# Patient Record
Sex: Male | Born: 1950
Health system: Southern US, Community
[De-identification: ages and names within clinical notes are randomized; demographics above are authoritative.]

## PROBLEM LIST (undated history)

## (undated) ENCOUNTER — Ambulatory Visit (HOSPITAL_COMMUNITY): Admission: EM | Payer: Medicare Other | Source: Home / Self Care

## (undated) DIAGNOSIS — Z87442 Personal history of urinary calculi: Secondary | ICD-10-CM

## (undated) DIAGNOSIS — Z8719 Personal history of other diseases of the digestive system: Secondary | ICD-10-CM

## (undated) DIAGNOSIS — I10 Essential (primary) hypertension: Secondary | ICD-10-CM

## (undated) DIAGNOSIS — I2510 Atherosclerotic heart disease of native coronary artery without angina pectoris: Secondary | ICD-10-CM

## (undated) DIAGNOSIS — C61 Malignant neoplasm of prostate: Secondary | ICD-10-CM

## (undated) DIAGNOSIS — E785 Hyperlipidemia, unspecified: Secondary | ICD-10-CM

## (undated) DIAGNOSIS — Z0000 Encounter for general adult medical examination without abnormal findings: Secondary | ICD-10-CM

## (undated) DIAGNOSIS — K625 Hemorrhage of anus and rectum: Secondary | ICD-10-CM

## (undated) DIAGNOSIS — E1149 Type 2 diabetes mellitus with other diabetic neurological complication: Secondary | ICD-10-CM

## (undated) DIAGNOSIS — K648 Other hemorrhoids: Secondary | ICD-10-CM

## (undated) DIAGNOSIS — M1990 Unspecified osteoarthritis, unspecified site: Secondary | ICD-10-CM

## (undated) DIAGNOSIS — Z89512 Acquired absence of left leg below knee: Secondary | ICD-10-CM

## (undated) DIAGNOSIS — G589 Mononeuropathy, unspecified: Secondary | ICD-10-CM

## (undated) DIAGNOSIS — L02416 Cutaneous abscess of left lower limb: Secondary | ICD-10-CM

## (undated) DIAGNOSIS — I872 Venous insufficiency (chronic) (peripheral): Secondary | ICD-10-CM

## (undated) DIAGNOSIS — Z8631 Personal history of diabetic foot ulcer: Secondary | ICD-10-CM

## (undated) DIAGNOSIS — Z860100 Personal history of colon polyps, unspecified: Secondary | ICD-10-CM

## (undated) DIAGNOSIS — H409 Unspecified glaucoma: Secondary | ICD-10-CM

## (undated) DIAGNOSIS — N529 Male erectile dysfunction, unspecified: Secondary | ICD-10-CM

## (undated) DIAGNOSIS — E119 Type 2 diabetes mellitus without complications: Secondary | ICD-10-CM

## (undated) DIAGNOSIS — I739 Peripheral vascular disease, unspecified: Secondary | ICD-10-CM

## (undated) DIAGNOSIS — M86271 Subacute osteomyelitis, right ankle and foot: Secondary | ICD-10-CM

## (undated) DIAGNOSIS — L03116 Cellulitis of left lower limb: Secondary | ICD-10-CM

## (undated) DIAGNOSIS — G4730 Sleep apnea, unspecified: Secondary | ICD-10-CM

## (undated) HISTORY — PX: BELPHAROPTOSIS REPAIR: SHX369

## (undated) HISTORY — DX: Cutaneous abscess of left lower limb: L02.416

## (undated) HISTORY — DX: Personal history of colon polyps, unspecified: Z86.0100

## (undated) HISTORY — PX: RETINAL DETACHMENT SURGERY: SHX105

## (undated) HISTORY — DX: Malignant neoplasm of prostate: C61

## (undated) HISTORY — DX: Encounter for general adult medical examination without abnormal findings: Z00.00

## (undated) HISTORY — PX: EYE SURGERY: SHX253

## (undated) HISTORY — DX: Type 2 diabetes mellitus with other diabetic neurological complication: E11.49

## (undated) HISTORY — DX: Subacute osteomyelitis, right ankle and foot: M86.271

## (undated) HISTORY — DX: Male erectile dysfunction, unspecified: N52.9

## (undated) HISTORY — DX: Cellulitis of left lower limb: L03.116

## (undated) HISTORY — DX: Venous insufficiency (chronic) (peripheral): I87.2

## (undated) HISTORY — DX: Hemorrhage of anus and rectum: K62.5

## (undated) HISTORY — PX: APPENDECTOMY: SHX54

## (undated) HISTORY — DX: Personal history of colonic polyps: Z86.010

## (undated) HISTORY — DX: Other hemorrhoids: K64.8

## (undated) HISTORY — DX: Personal history of diabetic foot ulcer: Z86.31

## (undated) HISTORY — DX: Atherosclerotic heart disease of native coronary artery without angina pectoris: I25.10

## (undated) HISTORY — DX: Acquired absence of left leg below knee: Z89.512

## (undated) HISTORY — DX: Hyperlipidemia, unspecified: E78.5

## (undated) HISTORY — DX: Unspecified glaucoma: H40.9

## (undated) HISTORY — PX: COLONOSCOPY: SHX174

## (undated) HISTORY — DX: Mononeuropathy, unspecified: G58.9

## (undated) HISTORY — DX: Sleep apnea, unspecified: G47.30

## (undated) HISTORY — DX: Personal history of other diseases of the digestive system: Z87.19

## (undated) HISTORY — DX: Unspecified osteoarthritis, unspecified site: M19.90

---

## 1991-01-08 HISTORY — PX: OTHER SURGICAL HISTORY: SHX169

## 1993-04-07 HISTORY — PX: KIDNEY STONE SURGERY: SHX686

## 1996-01-08 HISTORY — PX: CARDIAC CATHETERIZATION: SHX172

## 1998-09-27 ENCOUNTER — Encounter: Payer: Self-pay | Admitting: Ophthalmology

## 1998-09-27 ENCOUNTER — Observation Stay (HOSPITAL_COMMUNITY): Admission: EM | Admit: 1998-09-27 | Discharge: 1998-09-28 | Payer: Self-pay | Admitting: Ophthalmology

## 2003-04-08 HISTORY — PX: OTHER SURGICAL HISTORY: SHX169

## 2003-04-29 ENCOUNTER — Inpatient Hospital Stay (HOSPITAL_BASED_OUTPATIENT_CLINIC_OR_DEPARTMENT_OTHER): Admission: RE | Admit: 2003-04-29 | Discharge: 2003-04-29 | Payer: Self-pay | Admitting: Cardiovascular Disease

## 2003-05-03 ENCOUNTER — Ambulatory Visit (HOSPITAL_COMMUNITY): Admission: RE | Admit: 2003-05-03 | Discharge: 2003-05-04 | Payer: Self-pay | Admitting: *Deleted

## 2003-11-08 ENCOUNTER — Ambulatory Visit: Payer: Self-pay | Admitting: Cardiovascular Disease

## 2003-11-23 ENCOUNTER — Ambulatory Visit: Payer: Self-pay

## 2005-08-05 ENCOUNTER — Ambulatory Visit: Payer: Self-pay | Admitting: Internal Medicine

## 2005-09-05 ENCOUNTER — Ambulatory Visit: Payer: Self-pay | Admitting: Cardiovascular Disease

## 2005-09-07 HISTORY — PX: CORONARY ARTERY BYPASS GRAFT: SHX141

## 2005-09-10 ENCOUNTER — Ambulatory Visit: Payer: Self-pay

## 2005-09-23 ENCOUNTER — Ambulatory Visit: Payer: Self-pay | Admitting: Cardiovascular Disease

## 2005-09-26 ENCOUNTER — Encounter: Payer: Self-pay | Admitting: Vascular Surgery

## 2005-09-27 ENCOUNTER — Ambulatory Visit: Payer: Self-pay | Admitting: Cardiovascular Disease

## 2005-09-27 ENCOUNTER — Inpatient Hospital Stay (HOSPITAL_COMMUNITY): Admission: RE | Admit: 2005-09-27 | Discharge: 2005-10-05 | Payer: Self-pay | Admitting: Cardiovascular Disease

## 2005-10-02 ENCOUNTER — Ambulatory Visit: Payer: Self-pay | Admitting: Internal Medicine

## 2005-10-09 ENCOUNTER — Ambulatory Visit: Payer: Self-pay | Admitting: Internal Medicine

## 2005-10-24 ENCOUNTER — Encounter: Admission: RE | Admit: 2005-10-24 | Discharge: 2005-10-24 | Payer: Self-pay | Admitting: Cardiothoracic Surgery

## 2005-10-28 ENCOUNTER — Encounter: Admission: RE | Admit: 2005-10-28 | Discharge: 2006-01-26 | Payer: Self-pay | Admitting: Internal Medicine

## 2005-11-12 ENCOUNTER — Ambulatory Visit: Payer: Self-pay | Admitting: Internal Medicine

## 2005-11-14 ENCOUNTER — Encounter (HOSPITAL_COMMUNITY): Admission: RE | Admit: 2005-11-14 | Discharge: 2006-01-08 | Payer: Self-pay | Admitting: Cardiovascular Disease

## 2005-11-14 ENCOUNTER — Ambulatory Visit: Payer: Self-pay | Admitting: Cardiovascular Disease

## 2005-12-24 ENCOUNTER — Encounter: Payer: Self-pay | Admitting: Internal Medicine

## 2005-12-26 ENCOUNTER — Ambulatory Visit: Payer: Self-pay | Admitting: Cardiovascular Disease

## 2006-01-09 ENCOUNTER — Encounter (HOSPITAL_COMMUNITY): Admission: RE | Admit: 2006-01-09 | Discharge: 2006-03-19 | Payer: Self-pay | Admitting: Cardiovascular Disease

## 2006-01-23 ENCOUNTER — Ambulatory Visit: Payer: Self-pay | Admitting: Cardiovascular Disease

## 2006-02-12 ENCOUNTER — Ambulatory Visit: Payer: Self-pay | Admitting: Internal Medicine

## 2006-06-11 ENCOUNTER — Encounter: Payer: Self-pay | Admitting: Internal Medicine

## 2006-06-11 DIAGNOSIS — E785 Hyperlipidemia, unspecified: Secondary | ICD-10-CM | POA: Insufficient documentation

## 2006-06-11 DIAGNOSIS — H409 Unspecified glaucoma: Secondary | ICD-10-CM | POA: Insufficient documentation

## 2006-06-11 DIAGNOSIS — I2510 Atherosclerotic heart disease of native coronary artery without angina pectoris: Secondary | ICD-10-CM | POA: Insufficient documentation

## 2006-06-11 DIAGNOSIS — G4733 Obstructive sleep apnea (adult) (pediatric): Secondary | ICD-10-CM | POA: Insufficient documentation

## 2006-06-13 ENCOUNTER — Ambulatory Visit: Payer: Self-pay | Admitting: Internal Medicine

## 2006-06-13 DIAGNOSIS — N529 Male erectile dysfunction, unspecified: Secondary | ICD-10-CM | POA: Insufficient documentation

## 2006-06-16 LAB — CONVERTED CEMR LAB
ALT: 39 units/L (ref 0–40)
Albumin: 3.9 g/dL (ref 3.5–5.2)
BUN: 18 mg/dL (ref 6–23)
CO2: 29 meq/L (ref 19–32)
Calcium: 9.8 mg/dL (ref 8.4–10.5)
Chloride: 102 meq/L (ref 96–112)
Cholesterol: 160 mg/dL (ref 0–200)
Creatinine, Ser: 1.1 mg/dL (ref 0.4–1.5)
GFR calc Af Amer: 88 mL/min
GFR calc non Af Amer: 73 mL/min
Glucose, Bld: 139 mg/dL — ABNORMAL HIGH (ref 70–99)
HDL: 26.5 mg/dL — ABNORMAL LOW (ref >39.0)
Hgb A1c MFr Bld: 7.5 % — ABNORMAL HIGH (ref 4.6–6.0)
LDL Cholesterol: 95 mg/dL (ref 0–99)
Phosphorus: 4.3 mg/dL (ref 2.3–4.6)
Potassium: 4.8 meq/L (ref 3.5–5.1)
Sodium: 140 meq/L (ref 135–145)
Total CHOL/HDL Ratio: 6
Triglycerides: 194 mg/dL — ABNORMAL HIGH (ref 0–149)
VLDL: 39 mg/dL (ref 0–40)

## 2006-07-04 ENCOUNTER — Ambulatory Visit: Payer: Self-pay | Admitting: Internal Medicine

## 2006-07-08 ENCOUNTER — Telehealth (INDEPENDENT_AMBULATORY_CARE_PROVIDER_SITE_OTHER): Payer: Self-pay | Admitting: *Deleted

## 2006-07-24 ENCOUNTER — Telehealth: Payer: Self-pay | Admitting: Internal Medicine

## 2006-07-25 ENCOUNTER — Encounter: Payer: Self-pay | Admitting: Internal Medicine

## 2006-11-18 ENCOUNTER — Encounter: Payer: Self-pay | Admitting: Internal Medicine

## 2006-11-18 ENCOUNTER — Ambulatory Visit: Payer: Self-pay | Admitting: Cardiovascular Disease

## 2006-12-26 ENCOUNTER — Ambulatory Visit: Payer: Self-pay | Admitting: Internal Medicine

## 2006-12-29 LAB — CONVERTED CEMR LAB
ALT: 28 units/L (ref 0–53)
AST: 25 units/L (ref 0–37)
Albumin: 4.4 g/dL (ref 3.5–5.2)
Alkaline Phosphatase: 48 units/L (ref 39–117)
Chloride: 102 meq/L (ref 96–112)
Cholesterol: 154 mg/dL (ref 0–200)
Creatinine, Ser: 0.94 mg/dL (ref 0.40–1.50)
Creatinine, Urine: 150.8 mg/dL
HDL: 30 mg/dL — ABNORMAL LOW (ref >39)
Microalb Creat Ratio: 6.2 mg/g (ref 0.0–30.0)
Total Protein: 6.7 g/dL (ref 6.0–8.3)
Triglycerides: 467 mg/dL — ABNORMAL HIGH (ref <150)

## 2007-01-08 HISTORY — PX: INSERTION PROSTATE RADIATION SEED: SUR718

## 2007-01-27 ENCOUNTER — Ambulatory Visit: Payer: Self-pay | Admitting: Gastroenterology

## 2007-02-17 LAB — HM COLONOSCOPY

## 2007-02-20 ENCOUNTER — Ambulatory Visit: Payer: Self-pay | Admitting: Gastroenterology

## 2007-02-20 ENCOUNTER — Encounter: Payer: Self-pay | Admitting: Gastroenterology

## 2007-02-20 ENCOUNTER — Encounter: Payer: Self-pay | Admitting: Internal Medicine

## 2007-04-03 ENCOUNTER — Ambulatory Visit: Payer: Self-pay | Admitting: Internal Medicine

## 2007-04-03 DIAGNOSIS — M159 Polyosteoarthritis, unspecified: Secondary | ICD-10-CM | POA: Insufficient documentation

## 2007-04-06 LAB — CONVERTED CEMR LAB: Hgb A1c MFr Bld: 7 % — ABNORMAL HIGH (ref 4.6–6.0)

## 2007-04-08 DIAGNOSIS — C61 Malignant neoplasm of prostate: Secondary | ICD-10-CM | POA: Insufficient documentation

## 2007-04-15 ENCOUNTER — Encounter: Payer: Self-pay | Admitting: Internal Medicine

## 2007-05-01 ENCOUNTER — Encounter: Payer: Self-pay | Admitting: Internal Medicine

## 2007-05-19 ENCOUNTER — Ambulatory Visit (HOSPITAL_COMMUNITY): Admission: RE | Admit: 2007-05-19 | Discharge: 2007-05-19 | Payer: Self-pay | Admitting: Urology

## 2007-05-28 ENCOUNTER — Ambulatory Visit: Payer: Self-pay | Admitting: Internal Medicine

## 2007-06-08 ENCOUNTER — Encounter: Payer: Self-pay | Admitting: Internal Medicine

## 2007-06-15 ENCOUNTER — Ambulatory Visit: Payer: Self-pay | Admitting: Internal Medicine

## 2007-06-16 ENCOUNTER — Ambulatory Visit: Admission: RE | Admit: 2007-06-16 | Discharge: 2007-09-14 | Payer: Self-pay | Admitting: Radiation Oncology

## 2007-06-16 LAB — CONVERTED CEMR LAB: Hgb A1c MFr Bld: 7.4 % — ABNORMAL HIGH (ref 4.6–6.0)

## 2007-06-17 ENCOUNTER — Encounter: Payer: Self-pay | Admitting: Internal Medicine

## 2007-06-26 ENCOUNTER — Encounter: Payer: Self-pay | Admitting: Internal Medicine

## 2007-07-09 ENCOUNTER — Ambulatory Visit: Payer: Self-pay | Admitting: Cardiovascular Disease

## 2007-09-15 ENCOUNTER — Ambulatory Visit: Admission: RE | Admit: 2007-09-15 | Discharge: 2007-10-07 | Payer: Self-pay | Admitting: Radiation Oncology

## 2007-10-16 ENCOUNTER — Encounter: Admission: RE | Admit: 2007-10-16 | Discharge: 2007-10-16 | Payer: Self-pay | Admitting: Urology

## 2007-10-23 ENCOUNTER — Encounter: Payer: Self-pay | Admitting: Internal Medicine

## 2007-10-26 ENCOUNTER — Ambulatory Visit (HOSPITAL_BASED_OUTPATIENT_CLINIC_OR_DEPARTMENT_OTHER): Admission: RE | Admit: 2007-10-26 | Discharge: 2007-10-26 | Payer: Self-pay | Admitting: Urology

## 2007-11-09 ENCOUNTER — Ambulatory Visit: Payer: Self-pay | Admitting: Family Medicine

## 2007-11-11 ENCOUNTER — Encounter: Payer: Self-pay | Admitting: Internal Medicine

## 2007-11-12 ENCOUNTER — Ambulatory Visit: Admission: RE | Admit: 2007-11-12 | Discharge: 2008-01-03 | Payer: Self-pay | Admitting: Radiation Oncology

## 2007-12-14 ENCOUNTER — Ambulatory Visit: Payer: Self-pay | Admitting: Internal Medicine

## 2007-12-16 LAB — CONVERTED CEMR LAB
CO2: 28 meq/L (ref 19–32)
Chloride: 106 meq/L (ref 96–112)
GFR calc Af Amer: 111 mL/min
GFR calc non Af Amer: 92 mL/min
Hgb A1c MFr Bld: 7.4 % — ABNORMAL HIGH (ref 4.6–6.0)
Phosphorus: 4.8 mg/dL — ABNORMAL HIGH (ref 2.3–4.6)
Potassium: 4.3 meq/L (ref 3.5–5.1)
Sodium: 141 meq/L (ref 135–145)

## 2008-02-26 ENCOUNTER — Encounter: Payer: Self-pay | Admitting: Internal Medicine

## 2008-05-17 ENCOUNTER — Ambulatory Visit: Payer: Self-pay | Admitting: Internal Medicine

## 2008-06-24 ENCOUNTER — Encounter: Payer: Self-pay | Admitting: Internal Medicine

## 2008-06-29 ENCOUNTER — Ambulatory Visit: Payer: Self-pay | Admitting: Internal Medicine

## 2008-06-29 DIAGNOSIS — I872 Venous insufficiency (chronic) (peripheral): Secondary | ICD-10-CM | POA: Insufficient documentation

## 2008-07-01 LAB — CONVERTED CEMR LAB
AST: 23 units/L (ref 0–37)
Albumin: 4.1 g/dL (ref 3.5–5.2)
Alkaline Phosphatase: 39 units/L (ref 39–117)
BUN: 20 mg/dL (ref 6–23)
Basophils Relative: 0 % (ref 0.0–3.0)
Bilirubin, Direct: 0 mg/dL (ref 0.0–0.3)
Cholesterol: 157 mg/dL (ref 0–200)
Creatinine, Ser: 0.8 mg/dL (ref 0.4–1.5)
Direct LDL: 94.2 mg/dL
Eosinophils Absolute: 0.3 10*3/uL (ref 0.0–0.7)
Eosinophils Relative: 4.1 % (ref 0.0–5.0)
Glucose, Bld: 191 mg/dL — ABNORMAL HIGH (ref 70–99)
Hgb A1c MFr Bld: 8.3 % — ABNORMAL HIGH (ref 4.6–6.5)
Lymphocytes Relative: 16.8 % (ref 12.0–46.0)
MCHC: 34.9 g/dL (ref 30.0–36.0)
Monocytes Absolute: 0.5 10*3/uL (ref 0.1–1.0)
Neutrophils Relative %: 72.2 % (ref 43.0–77.0)
Platelets: 161 10*3/uL (ref 150.0–400.0)
RBC: 4.36 M/uL (ref 4.22–5.81)
TSH: 0.82 microintl units/mL (ref 0.35–5.50)
Total Protein: 6.9 g/dL (ref 6.0–8.3)
WBC: 7.7 10*3/uL (ref 4.5–10.5)

## 2008-07-26 ENCOUNTER — Encounter: Payer: Self-pay | Admitting: Internal Medicine

## 2008-11-09 ENCOUNTER — Encounter: Payer: Self-pay | Admitting: Internal Medicine

## 2008-12-13 ENCOUNTER — Ambulatory Visit: Payer: Self-pay | Admitting: Internal Medicine

## 2008-12-14 LAB — CONVERTED CEMR LAB
Alkaline Phosphatase: 41 units/L (ref 39–117)
BUN: 17 mg/dL (ref 6–23)
Basophils Absolute: 0 10*3/uL (ref 0.0–0.1)
Bilirubin, Direct: 0.1 mg/dL (ref 0.0–0.3)
CO2: 29 meq/L (ref 19–32)
Calcium: 9.5 mg/dL (ref 8.4–10.5)
Chloride: 104 meq/L (ref 96–112)
Creatinine, Ser: 0.9 mg/dL (ref 0.4–1.5)
GFR calc non Af Amer: 91.31 mL/min (ref >60)
HCT: 40.7 % (ref 39.0–52.0)
HDL: 28.4 mg/dL — ABNORMAL LOW (ref >39.00)
Hemoglobin: 13.8 g/dL (ref 13.0–17.0)
Hgb A1c MFr Bld: 8.6 % — ABNORMAL HIGH (ref 4.6–6.5)
Lymphs Abs: 1.1 10*3/uL (ref 0.7–4.0)
MCHC: 33.9 g/dL (ref 30.0–36.0)
MCV: 89.6 fL (ref 78.0–100.0)
Microalb, Ur: 1.5 mg/dL (ref 0.0–1.9)
Monocytes Absolute: 0.4 10*3/uL (ref 0.1–1.0)
Monocytes Relative: 5.5 % (ref 3.0–12.0)
Neutro Abs: 6.1 10*3/uL (ref 1.4–7.7)
Platelets: 155 10*3/uL (ref 150.0–400.0)
Potassium: 4.4 meq/L (ref 3.5–5.1)
RDW: 14.8 % — ABNORMAL HIGH (ref 11.5–14.6)
TSH: 0.76 microintl units/mL (ref 0.35–5.50)
Total Bilirubin: 0.9 mg/dL (ref 0.3–1.2)
Total CHOL/HDL Ratio: 5
Total Protein: 6.9 g/dL (ref 6.0–8.3)
VLDL: 56.8 mg/dL — ABNORMAL HIGH (ref 0.0–40.0)

## 2009-03-17 ENCOUNTER — Encounter: Payer: Self-pay | Admitting: Internal Medicine

## 2009-07-03 ENCOUNTER — Ambulatory Visit: Payer: Self-pay | Admitting: Internal Medicine

## 2009-07-03 DIAGNOSIS — K625 Hemorrhage of anus and rectum: Secondary | ICD-10-CM | POA: Insufficient documentation

## 2009-07-04 LAB — CONVERTED CEMR LAB
Basophils Absolute: 0 10*3/uL (ref 0.0–0.1)
Eosinophils Relative: 2.6 % (ref 0.0–5.0)
HCT: 40.2 % (ref 39.0–52.0)
Hemoglobin: 14.1 g/dL (ref 13.0–17.0)
Hgb A1c MFr Bld: 9.2 % — ABNORMAL HIGH (ref 4.6–6.5)
Lymphs Abs: 1.1 10*3/uL (ref 0.7–4.0)
MCV: 87.9 fL (ref 78.0–100.0)
Monocytes Absolute: 0.5 10*3/uL (ref 0.1–1.0)
Monocytes Relative: 6 % (ref 3.0–12.0)
Neutro Abs: 7.2 10*3/uL (ref 1.4–7.7)
RDW: 15.9 % — ABNORMAL HIGH (ref 11.5–14.6)

## 2009-07-21 ENCOUNTER — Encounter: Payer: Self-pay | Admitting: Internal Medicine

## 2009-07-27 ENCOUNTER — Encounter: Payer: Self-pay | Admitting: Internal Medicine

## 2009-11-13 ENCOUNTER — Ambulatory Visit: Payer: Self-pay | Admitting: Internal Medicine

## 2009-11-14 ENCOUNTER — Telehealth (INDEPENDENT_AMBULATORY_CARE_PROVIDER_SITE_OTHER): Payer: Self-pay

## 2009-11-14 ENCOUNTER — Encounter: Payer: Self-pay | Admitting: Gastroenterology

## 2009-11-22 ENCOUNTER — Ambulatory Visit: Payer: Self-pay | Admitting: Gastroenterology

## 2009-11-22 DIAGNOSIS — K648 Other hemorrhoids: Secondary | ICD-10-CM | POA: Insufficient documentation

## 2009-11-22 DIAGNOSIS — Z860100 Personal history of colon polyps, unspecified: Secondary | ICD-10-CM | POA: Insufficient documentation

## 2009-11-29 ENCOUNTER — Encounter: Payer: Self-pay | Admitting: Internal Medicine

## 2009-12-04 ENCOUNTER — Ambulatory Visit (HOSPITAL_COMMUNITY): Admission: RE | Admit: 2009-12-04 | Discharge: 2009-12-04 | Payer: Self-pay | Admitting: Gastroenterology

## 2009-12-04 ENCOUNTER — Ambulatory Visit: Payer: Self-pay | Admitting: Gastroenterology

## 2009-12-04 LAB — HM SIGMOIDOSCOPY

## 2009-12-13 ENCOUNTER — Ambulatory Visit: Payer: Self-pay | Admitting: Gastroenterology

## 2009-12-15 ENCOUNTER — Ambulatory Visit: Payer: Self-pay | Admitting: Internal Medicine

## 2009-12-15 DIAGNOSIS — E1349 Other specified diabetes mellitus with other diabetic neurological complication: Secondary | ICD-10-CM

## 2009-12-15 DIAGNOSIS — E1365 Other specified diabetes mellitus with hyperglycemia: Secondary | ICD-10-CM | POA: Insufficient documentation

## 2009-12-16 LAB — CONVERTED CEMR LAB
Albumin: 3.9 g/dL (ref 3.5–5.2)
Basophils Relative: 0.7 % (ref 0.0–3.0)
Chloride: 103 meq/L (ref 96–112)
Cholesterol: 159 mg/dL (ref 0–200)
Eosinophils Relative: 4.9 % (ref 0.0–5.0)
GFR calc non Af Amer: 91 mL/min (ref >60.00)
HCT: 38.9 % — ABNORMAL LOW (ref 39.0–52.0)
HDL: 26.4 mg/dL — ABNORMAL LOW (ref >39.00)
Hemoglobin: 13.4 g/dL (ref 13.0–17.0)
Lymphs Abs: 1 10*3/uL (ref 0.7–4.0)
Microalb Creat Ratio: 0.8 mg/g (ref 0.0–30.0)
Monocytes Relative: 5.9 % (ref 3.0–12.0)
Neutro Abs: 6.4 10*3/uL (ref 1.4–7.7)
Platelets: 166 10*3/uL (ref 150.0–400.0)
Potassium: 4.4 meq/L (ref 3.5–5.1)
RBC: 4.38 M/uL (ref 4.22–5.81)
Sodium: 140 meq/L (ref 135–145)
TSH: 1.2 microintl units/mL (ref 0.35–5.50)
Total CHOL/HDL Ratio: 6
Total Protein: 6.6 g/dL (ref 6.0–8.3)
VLDL: 58 mg/dL — ABNORMAL HIGH (ref 0.0–40.0)
WBC: 8.4 10*3/uL (ref 4.5–10.5)

## 2010-02-08 NOTE — Assessment & Plan Note (Signed)
Summary: RECTAL BLEEDING/YF   History of Present Illness Visit Type: Initial Consult Primary GI MD: Elie Goody MD [facility] Primary Kathreen Dileo: Tillman Abide, MD Requesting [place] Pam: Tillman Abide, MD Chief Complaint: Rectal bleeding, BRB & dark  blood colts in toilet, soaks thru pateints  clothes History of Present Illness:   Patient is a 60 year old male with history of multiple medical problems. He had a screening colonoscopy with removal of an adenomatous polyp Jan. 2009 by Dr. Russella Dar. Following that, patient diagnosed with prostate cancer, underwent radiation treatments followed by seed implants. Over the last six months patient has had intermittent painless rectal bleeding.  Bowel movements are otherwise normal.. Sometimes if he bleeds a lot feels a little LLQ abdominal discomfort.     GI Review of Systems    Reports abdominal pain.     Location of  Abdominal pain: LLQ.    Denies acid reflux, belching, bloating, chest pain, dysphagia with liquids, dysphagia with solids, heartburn, loss of appetite, nausea, vomiting, vomiting blood, weight loss, and  weight gain.      Reports rectal bleeding.     Denies anal fissure, black tarry stools, change in bowel habit, constipation, diarrhea, diverticulosis, fecal incontinence, heme positive stool, hemorrhoids, irritable bowel syndrome, jaundice, light color stool, liver problems, and  rectal pain.   Current Medications (verified): 1)  Glipizide 5 Mg Tabs (Glipizide) .... Take 1 By Mouth Once Daily 2)  Lantus 100 Unit/ml Soln (Insulin Glargine) .... Take 65 Units Every Evening 3)  Hyzaar 50-12.5 Mg Tabs (Losartan Potassium-Hctz) .... Take One By Mouth Daily 4)  Metformin Hcl 1000 Mg  Tabs (Metformin Hcl) .Marland Kitchen.. 1 Two Times A Day 5)  Carvedilol 25 Mg Tabs (Carvedilol) .... Take One Tablet By Mouth Twice A Day 6)  Aspirin 325 Mg Tabs (Aspirin) .... Take 1 Tablet By Mouth Once A Day 7)  Alphagan P 0.15 % Soln (Brimonidine Tartrate) .... Use One  Drop in Each Eye Daily 8)  Multivitamins   Tabs (Multiple Vitamin) .... Take One By Mouth Daily 9)  Onetouch Ultra Test  Strp (Glucose Blood) .... Check Weekly 10)  Triamcinolone Acetonide 0.025 % Crea (Triamcinolone Acetonide) .... Apply Three Times A Day As Needed To Irritated Area  Allergies (verified): 1)  ! Zocor (Simvastatin) 2)  ! Zetia (Ezetimibe) 3)  ! Lipitor (Atorvastatin Calcium)  Past History:  Past Medical History: Reviewed history from 06/29/2008 and no changes required. Coronary artery disease----------------------------------------Dr Eden Emms Diabetes mellitus, type II Hyperlipidemia Sleep apnea Glaucoma Gallstones Osteoarthritis Prostate cancer Chronic venous insufficiency  Past Surgical History: Reviewed history from 11/13/2009 and no changes required. 1998    CP/SOB  cath (-)   [C-Pap started]             Appendix 1993    Thrombosed vein - Right leg 4/95      Kidney stone 2002-2003  Retinal detachment 4/05      RCA stents x 2 - EF 55% 11/05    Myoview stress (+) EKG (-) images EF 57% 9/07      Cardiolite - IWMI - hypotension / EKG changes 9/07             CABG (post-op A.Fib) 2009    RT and seeds for prostate cancer  Family History: Reviewed history from 06/11/2006 and no changes required. Father: Died 84 lung CA Mother: Alive 24 Siblings: 1 sister alive CV:  Paternal aunts and uncles died of MI's HBP:  ?? DM:  some  Social  History: Reviewed history from 04/03/2007 and no changes required. Widowed 2006 Stays with mom alternate months (has MS) Children: 3 children Occupation: Heavy Theatre stage manager Hobbies:  Gardens Quit cigars about 2003 Alcohol use-no  Review of Systems       The patient complains of back pain, fatigue, and muscle pains/cramps.  The patient denies allergy/sinus, anemia, anxiety-new, arthritis/joint pain, blood in urine, breast changes/lumps, change in vision, confusion, cough, coughing up blood, depression-new, fainting,  fever, headaches-new, hearing problems, heart murmur, heart rhythm changes, itching, menstrual pain, night sweats, nosebleeds, pregnancy symptoms, shortness of breath, skin rash, sleeping problems, sore throat, swelling of feet/legs, swollen lymph glands, thirst - excessive , urination - excessive , urination changes/pain, urine leakage, vision changes, and voice change.    Vital Signs:  Patient profile:   60 year old male Height:      62 inches Weight:      290.25 pounds BMI:     53.28 Pulse rate:   92 / minute Pulse rhythm:   regular BP sitting:   122 / 78  (left arm) Cuff size:   regular  Vitals Entered By: June McMurray CMA Duncan Dull) (November 22, 2009 8:32 AM)  Physical Exam  General:  Pleasant, obese white male Eyes:  Conjunctiva pink, no icterus.  Neck:  no obvious masses  Lungs:  Clear throughout to auscultation. Heart:  Regular rate and rhythm; no murmurs, rubs,  or bruits. Abdomen:  Abdomen obese soft, nontender, nondistended. No obvious masses or hepatomegaly.Normal bowel sounds.  Rectal:  No external lesions. No internal lesions felt. On anoscopic view there were some mildly inflamed internal hemorrhoids. Light brown heme negative stool Msk:  Symmetrical with no gross deformities. Normal posture. Extremities:  No palmar erythema, no edema.  Neurologic:  Alert and  oriented x4;  grossly normal neurologically. Skin:  Intact without significant lesions or rashes. Cervical Nodes:  No significant cervical adenopathy. Psych:  Alert and cooperative. Normal mood and affect.  Impression & Recommendations:  Problem # 1:  RECTAL BLEEDING (ICD-569.3) Assessment Deteriorated Frequent painless rectal bleeding over last six months, possible secondary to internal hemorrhoids and / or radiation proctitis. Full colonoscopy two years ago. He looks fine, no pallor, abdominal exam benign.  For further evaluation the patient will be scheduled for a flexible sigmoidoscopy/biopsies/APC (if  indicated).  The risks and benefits of the procedure were discussed with the patient and his friend Almyra Free. Patient agrees to proceed. In the meantime will try steroid suppositories for internal hemorhoids. Orders: Flex with Sedation (Flex w/Sed)  Problem # 2:  PERSONAL HX COLONIC POLYPS (ICD-V12.72) Assessment: Comment Only Adenomatous polyp in Feb.2009. For recall colonoscopy Feb. 2014  Problem # 3:  HEMORRHOIDS-INTERNAL (ICD-455.0) Assessment: Deteriorated See #1.  Patient Instructions: 1)  We have sent a prescription for suppositories to your pharmacy.  2)  We have scheduled the Flexible Sigmoidoscopy with Dr. Claudette Head on 12-13-09. 3)  Directions and brochure provided. 4)  Diabetic medication instructions provided. 5)  [facility] Patient Information Guide given to patient. 6)  Copy sent to : Dr. Tillman Abide 7)  The medication list was reviewed and reconciled.  All changed / newly prescribed medications were explained.  A complete medication list was provided to the patient / caregiver.  Prescriptions: HEMORRHOIDAL-HC 25 MG SUPP (HYDROCORTISONE ACETATE) Insert one into rectum every night for 7 days  #7 x 1   Entered and Authorized by:   Willette Cluster NP   Signed by:   Willette Cluster NP  on 11/23/2009   Method used:   Electronically to        [facility]  [address] [facility] [address] [id number]       [address]       [place], [place]  [address]       Ph: [phone]       Fax: [phone]   RxID:   [id number]

## 2010-02-08 NOTE — Progress Notes (Signed)
Summary: rescedule REV   ---- Converted from flag ---- ---- 11/13/2009 6:30 PM, Meryl Dare MD [facility] wrote: Yes please move up. Can see extender on day I am supervising.  ---- 11/13/2009 1:49 PM, Darcey Nora RN, CGRN wrote: he is scheduled for 12/27/09 do you want me to move it up?  ---- 11/13/2009 10:28 AM, Meryl Dare MD [facility] wrote: Was this patient scheduled for an REV with me? If not they should have REV. Note forwarded to me with rectal bleeding. ------------------------------  Phone Note Outgoing Call Call back at [facility] Phone [phone]   Call placed by: Darcey Nora RN, CGRN,  November 14, 2009 11:06 AM Call placed to: Patient Summary of Call: rescheduled the appointment for 11/22/09 8:30 with Willette Cluster RNP  Initial call taken by: Darcey Nora RN, CGRN,  November 14, 2009 11:07 AM

## 2010-02-08 NOTE — Letter (Signed)
Summary: [facility]  [facility]   Imported By: Lanelle Bal 07/28/2009 11:30:49  _____________________________________________________________________  External Attachment:    Type:   Image     Comment:   External Document  Appended Document: [facility] No evidence of recurrent prostate cancer Having occ BRBPR

## 2010-02-08 NOTE — Assessment & Plan Note (Signed)
Summary: RECTUM BLEEDING/DLO   Vital Signs:  Patient profile:   60 year old male Weight:      292 pounds Temp:     98.3 degrees F oral Pulse rate:   80 / minute Pulse rhythm:   regular BP sitting:   150 / 80  (left arm) Cuff size:   large  Vitals Entered By: Mervin Hack CMA Duncan Dull) (November 13, 2009 10:01 AM) CC: rectal bleeding   History of Present Illness: Still bleeding Has been going on intermittently for about 1 year worse during the week--when he is standing on concrete all the time---can actually bleed through his clothes  Occ LLQ discomfort---seems to be irritated by eating at times Blood is bright red  Has been having 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 Zocor (Simvastatin) 2)  ! Zetia (Ezetimibe) 3)  ! Lipitor (Atorvastatin Calcium)  Past History:  Past medical, surgical, family and social histories (including risk factors) reviewed for relevance to current acute and chronic problems.  Past Medical History: Reviewed history from 06/29/2008 and no changes required. Coronary artery disease----------------------------------------Dr Eden Emms Diabetes mellitus, type II Hyperlipidemia Sleep apnea Glaucoma Gallstones Osteoarthritis Prostate cancer Chronic venous insufficiency  Past Surgical History: 1998    CP/SOB  cath (-)   [C-Pap started]             Appendix 1993    Thrombosed vein - Right leg 4/95      Kidney stone 2002-2003  Retinal detachment 4/05      RCA stents x 2 - EF 55% 11/05    Myoview stress (+) EKG (-) images EF 57% 9/07      Cardiolite - IWMI - hypotension / EKG changes 9/07             CABG (post-op A.Fib) 2009    RT and seeds for prostate cancer  Family History: Reviewed history from 06/11/2006 and no changes required. Father: Died 68 lung CA Mother: Alive 86 Siblings: 1 sister alive CV:  Paternal  aunts and uncles died of MI's HBP:  ?? DM:  some  Social History: Reviewed history from 04/03/2007 and no changes required. Widowed 2006 Stays with mom alternate months (has MS) Children: 3 children Occupation: Heavy Theatre stage manager Hobbies:  Gardens Quit cigars about 2003 Alcohol use-no  Review of Systems       appetite is okay weight up 8# but he didn't realize this Not doing well on his diabetic control  Physical Exam  General:  alert and normal appearance.   Abdomen:  soft, non-tender, and no masses.   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 Take 1 by mouth once daily 2)  Lantus 100 Unit/ml Soln (Insulin glargine) .... Take 65 units every evening 3)  Hyzaar 50-12.5 Mg Tabs (Losartan potassium-hctz) .... Take one by mouth daily 4)  Viagra 100 Mg Tabs (Sildenafil citrate) .... Take 1/2-1 tab about 1/2 hour before sex 5)  Metformin Hcl 1000 Mg Tabs (Metformin hcl) .Marland Kitchen.. 1 two times a day 6)  Carvedilol 25 Mg Tabs (Carvedilol) .... Take one tablet by mouth twice a day 7)  Aspirin 325 Mg Tabs (Aspirin) .... Take 1 tablet by mouth once a day 8)  Alphagan P 0.15 % Soln (Brimonidine tartrate) .... Use one drop in each eye daily 9)  Multivitamins Tabs (Multiple vitamin) .... Take one by mouth daily 10)  Onetouch Ultra Test Strp (Glucose blood) .... Check weekly 11)  Triamcinolone Acetonide 0.025 % Crea (Triamcinolone acetonide) .... Apply three times a day as needed to irritated area  Patient Instructions: 1)  Please keep December appt 2)  Referral Appointment Information 3)  Day/Date: 4)   Time: 5)  Place/MD: 6)  Address: 7)  Phone/Fax: 8)  Patient given appointment information. Information/Orders faxed/mailed.    Orders Added: 1)  Est. Patient Level III [[address]] 2)  Gastroenterology Referral [GI]    Current Allergies (reviewed today): ! ZOCOR (SIMVASTATIN) ! ZETIA (EZETIMIBE) ! LIPITOR (ATORVASTATIN CALCIUM)

## 2010-02-08 NOTE — Letter (Signed)
Summary: [facility]  [facility]   Imported By: Lanelle Bal 12/08/2009 12:58:31  _____________________________________________________________________  External Attachment:    Type:   Image     Comment:   External Document  Appended Document: [facility] prostate cancer follow up PSA still low 6 month follow up

## 2010-02-08 NOTE — Letter (Signed)
Summary: [facility]  [facility]   Imported By: Maryln Gottron 08/24/2009 14:31:44  _____________________________________________________________________  External Attachment:    Type:   Image     Comment:   External Document  Appended Document: [facility]     Clinical Lists Changes  Observations: Added new observation of DIAB EYE EX: No diabetic retinopathy.   Glaucoma.   Cataract.    (07/27/2009 15:29)       Diabetic Eye Exam  Procedure date:  07/27/2009  Findings:      No diabetic retinopathy.   Glaucoma.   Cataract.

## 2010-02-08 NOTE — Letter (Signed)
Summary: Diabetic Instructions  [facility]  [address] [place], [place] [address]   Phone: [phone]  Fax: [phone]    [name] Oct 14, 1950 MRN: [id number]   X  ORAL DIABETIC MEDICATION INSTRUCTIONS  The day before your procedure:   Take your diabetic pill as you do normally  The day of your procedure:   Do not take your diabetic pill    We will check your blood sugar levels during the admission process and again in Recovery before discharging you home  ________________________________________________________________________  X     INSULIN (LONG ACTING) MEDICATION INSTRUCTIONS (Lantus, NPH, 70/30, Humulin, Novolin-N)   The day before your procedure:   Take  your regular evening dose    The day of your procedure:   Do not take your morning dose    _  _   INSULIN (SHORT ACTING) MEDICATION INSTRUCTIONS (Regular, Humulog, Novolog)   The day before your procedure:   Do not take your evening dose   The day of your procedure:   Do not take your morning dose   _  _   INSULIN PUMP MEDICATION INSTRUCTIONS  We will contact the physician managing your diabetic care for written dosage instructions for the day before your procedure and the day of your procedure.  Once we have received the instructions, we will contact you.

## 2010-02-08 NOTE — Procedures (Signed)
Summary: Flexible Sigmoidoscopy  Patient: [name] Note: All result statuses are Final unless otherwise noted.  Tests: (1) Flexible Sigmoidoscopy (FLX)  FLX Flexible Sigmoidoscopy                             DONE     [facility]     [address] [place], [place]  [address]           FLEXIBLE SIGMOIDOSCOPY PROCEDURE REPORT     PATIENT:  [name], [name]  MR#:  [id number]     BIRTHDATE:  [birth year], 59 yrs. old  GENDER:  male     ENDOSCOPIST:  Judie Petit T. Russella Dar, MD, [facility]           PROCEDURE DATE:  12/04/2009     PROCEDURE:  Flexible Sigmoidoscopy for control of bleeding     ASA CLASS:  Class II     INDICATIONS:  hematochezia     MEDICATIONS:   Fentanyl 50 mcg IV, Versed 5 mg IV     DESCRIPTION OF PROCEDURE:   After the risks benefits and     alternatives of the procedure were thoroughly explained, informed     consent was obtained.  Digital rectal exam was performed and     revealed no abnormalities.   The Pentax [id number] endoscope was     introduced through the anus and advanced to the descending colon,     without limitations.  The quality of the prep was good.  The     instrument was then slowly withdrawn as the mucosa was fully     examined.     <<PROCEDUREIMAGES>>     Moderate diverticulosis was found sigmoid to descending colon.     Radiation proctitis in the rectum. It was bleeding. Argon plasma     coagulation was used to ablate the vascular lesions with     hemostasis achieved.  Retroflexed views in the rectum revealed     small hemorrhoids.  The scope was then withdrawn from the patient     and the procedure completed.           COMPLICATIONS:  None           ENDOSCOPIC IMPRESSION:     1) Moderate diverticulosis in the rectum to descending colon     2) Radiation proctitis     3) Small hemorrhoids           RECOMMENDATIONS:     1) high fiber diet     2) follow-up: GI clinic PRN     3) avoid ASA, NSAIDs for 2 weeks           Malcolm T. Russella Dar, MD,  Clementeen Graham           CC:  Karie Schwalbe, MD           n.     Rosalie DoctorVenita Lick. Stark at 12/04/2009 01:08 PM           [place], [place], [id number]  Note: An exclamation mark (!) indicates a result that was not dispersed into the flowsheet. Document Creation Date: 12/04/2009 1:09 PM _______________________________________________________________________  (1) Order result status: Final Collection or observation date-time: 12/04/2009 13:03 Requested date-time:  Receipt date-time:  Reported date-time:  Referring Physician:   Ordering Physician: Claudette Head [phone]) Specimen Source:  Source: Launa Grill Order Number: [id number] Lab site:

## 2010-02-08 NOTE — Letter (Signed)
Summary: [facility]  [address] [place], [place] [address]   Phone: [phone]  Fax: [phone]       [name]    30-May-1950    MRN: [id number]        Procedure Day /Date: 12-13-09     Arrival Time: 2:30 PM      Procedure Time: 3:30 PM     Location of Procedure:                    X       [facility] (4th Floor)  PREPARATION FOR FLEXIBLE SIGMOIDOSCOPY WITH MAGNESIUM CITRATE  Prior to the day before your procedure, purchase one 8 oz. bottle of Magnesium Citrate and one Fleet Enema from the laxative section of your drugstore.  _________________________________________________________________________________________________  THE DAY BEFORE YOUR PROCEDURE             DATE: 12-12-09      DAY: Tuesday  1.   Have a clear liquid dinner the night before your procedure.  2.   Do not drink anything colored red or purple.  Avoid juices with pulp.  No orange juice.              CLEAR LIQUIDS INCLUDE: Water Jello Ice Popsicles Tea (sugar ok, no milk/cream) Powdered fruit flavored drinks Coffee (sugar ok, no milk/cream) Gatorade Juice: apple, white grape, white cranberry  Lemonade Clear bullion, consomm, broth Carbonated beverages (any kind) Strained chicken noodle soup Hard Candy   3.   At 7:00 pm the night before your procedure, drink one bottle of Magnesium Citrate over ice.  4.   Drink at least 3 more glasses of clear liquids before bedtime (preferably juices).  5.   Results are expected usually within 1 to 6 hours after taking the Magnesium Citrate.  ___________________________________________________________________________________________________  THE DAY OF YOUR PROCEDURE            DATE: 12-13-09     DAY: Wednesday  1.   Use Fleet Enema one hour prior to coming for procedure.  2.   You may drink clear liquids until 1;30 PM  (2 hours before exam)       MEDICATION INSTRUCTIONS  Unless otherwise  instructed, you should take regular prescription medications with a small sip of water as early as possible the morning of your procedure.  Diabetic patients - see separate instructions.        OTHER INSTRUCTIONS  You will need a responsible adult at least 62 years of age to accompany you and drive you home.   This person must remain in the waiting room during your procedure.  Wear loose fitting clothing that is easily removed.  Leave jewelry and other valuables at home.  However, you may wish to bring a book to read or an iPod/MP3 player to listen to music as you wait for your procedure to start.  Remove all body piercing jewelry and leave at home.  Total time from sign-in until discharge is approximately 2-3 hours.  You should go home directly after your procedure and rest.  You can resume normal activities the day after your procedure.  The day of your procedure you should not:   Drive   Make legal decisions   Operate machinery   Drink alcohol   Return to work  You will receive specific instructions about eating, activities and medications before you leave.   The above instructions have been reviewed and explained  to me by   _______________________    I fully understand and can verbalize these instructions _____________________________ Date _________

## 2010-02-08 NOTE — Assessment & Plan Note (Signed)
Summary: 6 MONTH FOLLOW UP/[facility]   Vital Signs:  Patient profile:   60 year old male Weight:      284 pounds BMI:     52.13 Temp:     98.4 degrees F oral Pulse rate:   88 / minute Pulse rhythm:   regular BP sitting:   132 / 70  (left arm) Cuff size:   large  Vitals Entered By: Mervin Hack CMA Duncan Dull) (July 03, 2009 2:52 PM) CC: 6 month follow-up   History of Present Illness: Has been more careful with eating weight down 11# no sig exercise but does work hard and tries to walk when he can  Here with steady girlfirend Serious but not engaged as yet  Checks sugars occ still  ~200 fasting Left 5th toenail fell off--no pain now No foot pain Occ tingling---intermittent occ tingling in fingers also-- mostly just left 5th finger  No chest pain No SOB Occ mild edema--only at end of day and generally better the next AM No palpitations  Has had rectal bleeding intermittent has actually been heavy enough to come through clothes  Allergies: 1)  ! Zocor (Simvastatin) 2)  ! Zetia (Ezetimibe) 3)  ! Lipitor (Atorvastatin Calcium)  Past History:  Past medical, surgical, family and social histories (including risk factors) reviewed for relevance to current acute and chronic problems.  Past Medical History: Reviewed history from 06/29/2008 and no changes required. Coronary artery disease----------------------------------------Dr Eden Emms Diabetes mellitus, type II Hyperlipidemia Sleep apnea Glaucoma Gallstones Osteoarthritis Prostate cancer Chronic venous insufficiency  Past Surgical History: Reviewed history from 12/14/2007 and no changes required. 1998    CP/SOB  cath (-)   [C-Pap started] Appendix 1993    Thrombosed vein - Right leg 4/95      Kidney stone 2002-2003  Retinal detachment 4/05      RCA stents x 2 - EF 55% 11/05    Myoview stress (+) EKG (-) images EF 57% 9/07      Cardiolite - IWMI - hypotension / EKG changes 9/07             CABG (post-op  A.Fib)  Family History: Reviewed history from 06/11/2006 and no changes required. Father: Died 106 lung CA Mother: Alive 61 Siblings: 1 sister alive CV:  Paternal aunts and uncles died of MI's HBP:  ?? DM:  some  Social History: Reviewed history from 04/03/2007 and no changes required. Widowed 2006 Stays with mom alternate months (has MS) Children: 3 children Occupation: Heavy Theatre stage manager Hobbies:  Gardens Quit cigars about 2003 Alcohol use-no  Review of Systems       Sleeps okay uses CPAP no myalgias  Physical Exam  General:  alert and normal appearance.   Neck:  supple, no masses, no thyromegaly, no carotid bruits, and no cervical lymphadenopathy.   Lungs:  normal respiratory effort and normal breath sounds.   Heart:  normal rate, regular rhythm, no murmur, and no gallop.   Abdomen:  soft and non-tender.   Rectal:  no hemorrhoids and no masses.   abraded skin in perirectal area Pulses:  1+ in feet Extremities:  no sig edema Psych:  normally interactive, good eye contact, not anxious appearing, and not depressed appearing.    Diabetes Management Exam:    Foot Exam (with socks and/or shoes not present):       Sensory-Pinprick/Light touch:          Left medial foot (L-4): diminished  Left dorsal foot (L-5): diminished          Left lateral foot (S-1): diminished          Right medial foot (L-4): diminished          Right dorsal foot (L-5): diminished          Right lateral foot (S-1): diminished       Inspection:          Left foot: abnormal             Comments: 5th toenail missing          Right foot: normal       Nails:          Left foot: thickened          Right foot: thickened   Impression & Recommendations:  Problem # 1:  RECTAL BLEEDING (ICD-569.3) Assessment New  seems to be external irritation  he feels the bleeding is inside will Rx triamcinolone to use as needed  if persists would have him see GI again---likely could have  radiation colitis  Orders: TLB-CBC Platelet - w/Differential (85025-CBCD) Venipuncture ([address])  Problem # 2:  NEUROPATHY (ICD-355.9) Assessment: New  seems to be diabetic related wiil just check B12  Orders: TLB-B12, Serum-Total ONLY (60454-U98)  Problem # 3:  DIABETES MELLITUS, TYPE II (ICD-250.00) Assessment: Comment Only  hopefully better with weight loss  His updated medication list for this problem includes:    Lantus 100 Unit/ml Soln (Insulin glargine) .Marland Kitchen... Take 65 units every evening    Hyzaar 50-12.5 Mg Tabs (Losartan potassium-hctz) .Marland Kitchen... Take one by mouth daily    Metformin Hcl 1000 Mg Tabs (Metformin hcl) .Marland Kitchen... 1 two times a day    Aspirin 325 Mg Tabs (Aspirin) .Marland Kitchen... Take 1 tablet by mouth once a day  Labs Reviewed: Creat: 0.9 (12/13/2008)     Last Eye Exam: No diabetic retinopathy.    (07/26/2008) Reviewed HgBA1c results: 8.6 (12/13/2008)  8.3 (06/29/2008)  Orders: TLB-A1C / Hgb A1C (Glycohemoglobin) (83036-A1C)  Problem # 4:  CORONARY ARTERY DISEASE (ICD-414.00) Assessment: Unchanged has been quiet  His updated medication list for this problem includes:    Hyzaar 50-12.5 Mg Tabs (Losartan potassium-hctz) .Marland Kitchen... Take one by mouth daily    Carvedilol 25 Mg Tabs (Carvedilol) .Marland Kitchen... Take one tablet by mouth twice a day    Aspirin 325 Mg Tabs (Aspirin) .Marland Kitchen... Take 1 tablet by mouth once a day  Problem # 5:  ADENOCARCINOMA, PROSTATE (ICD-185) Assessment: Comment Only urology notes reviewed has been doing well  Complete Medication List: 1)  Lantus 100 Unit/ml Soln (Insulin glargine) .... Take 65 units every evening 2)  Hyzaar 50-12.5 Mg Tabs (Losartan potassium-hctz) .... Take one by mouth daily 3)  Viagra 100 Mg Tabs (Sildenafil citrate) .... Take 1/2-1 tab about 1/2 hour before sex 4)  Metformin Hcl 1000 Mg Tabs (Metformin hcl) .Marland Kitchen.. 1 two times a day 5)  Carvedilol 25 Mg Tabs (Carvedilol) .... Take one tablet by mouth twice a day 6)  Aspirin 325 Mg Tabs  (Aspirin) .... Take 1 tablet by mouth once a day 7)  Alphagan P 0.15 % Soln (Brimonidine tartrate) .... Use one drop in each eye daily 8)  Multivitamins Tabs (Multiple vitamin) .... Take one by mouth daily 9)  Onetouch Ultra Test Strp (Glucose blood) .... Check weekly 10)  Triamcinolone Acetonide 0.025 % Crea (Triamcinolone acetonide) .... Apply three times a day as needed to irritated area  Patient Instructions: 1)  Please schedule a follow-up appointment in 6 months for physical Prescriptions: TRIAMCINOLONE ACETONIDE 0.025 % CREA (TRIAMCINOLONE ACETONIDE) apply three times a day as needed to irritated area  #60gm x 1   Entered and Authorized by:   Cindee Salt MD   Signed by:   Cindee Salt MD on 07/03/2009   Method used:   Electronically to        [facility] [facility]retail)       [address]       [place], [place]  [address]       Ph: [phone] or [phone]       Fax: [phone]   RxID:   [id number]   Current Allergies (reviewed today): ! ZOCOR (SIMVASTATIN) ! ZETIA (EZETIMIBE) ! LIPITOR (ATORVASTATIN CALCIUM)

## 2010-02-08 NOTE — Letter (Signed)
Summary: [facility]  [facility]   Imported By: Maryln Gottron 03/28/2009 13:53:18  _____________________________________________________________________  External Attachment:    Type:   Image     Comment:   External Document  Appended Document: [facility] prostate cancer follow up PSA 0.18 4  month follow up

## 2010-02-08 NOTE — Letter (Signed)
Summary: Appt Reminder 2  [facility]  [address] [place], [place] [address]   Phone: [phone]  Fax: [phone]        November 14, 2009 MRN: [id number]    [facility] [address] [place], [place]  [address]    Dear Mr. CORRELL,   You have a return appointment with Willette Cluster RNP on 11/22/09 at 8:30 am.  Please remember to bring a complete list of the medicines you are taking, your insurance card and your co-pay.  If you have to cancel or reschedule this appointment, please call before 5:00 pm the evening before to avoid a cancellation fee.  If you have any questions or concerns, please call [phone].    Sincerely,    Darcey Nora RN, CGRN

## 2010-02-08 NOTE — Assessment & Plan Note (Signed)
Summary: CPX / LFW   Vital Signs:  Patient profile:   60 year old male Weight:      292 pounds Temp:     98.0 degrees F oral Pulse rate:   72 / minute Pulse rhythm:   regular BP sitting:   120 / 80  (left arm) Cuff size:   large  Vitals Entered By: Mervin Hack CMA Duncan Dull) (December 15, 2009 8:35 AM) CC: ADULT PHYSICAL   History of Present Illness: Doing well Bleeding is gone now after the treatment  Prostate cancer quiet on last check up now on 6 month follow up  Checks fasting sugars once a week or so 130-190 No hypoglycemic reactions  Forced retirement--business ended Has some severence Not sure what he wants to do  Allergies: 1)  ! Zocor (Simvastatin) 2)  ! Zetia (Ezetimibe) 3)  ! Lipitor (Atorvastatin Calcium)  Past History:  Past medical, surgical, family and social histories (including risk factors) reviewed for relevance to current acute and chronic problems.  Past Medical History: Reviewed history from 06/29/2008 and no changes required. Coronary artery disease----------------------------------------Dr Eden Emms Diabetes mellitus, type II Hyperlipidemia Sleep apnea Glaucoma Gallstones Osteoarthritis Prostate cancer Chronic venous insufficiency  Past Surgical History: Reviewed history from 11/13/2009 and no changes required. 1998    CP/SOB  cath (-)   [C-Pap started]             Appendix 1993    Thrombosed vein - Right leg 4/95      Kidney stone 2002-2003  Retinal detachment 4/05      RCA stents x 2 - EF 55% 11/05    Myoview stress (+) EKG (-) images EF 57% 9/07      Cardiolite - IWMI - hypotension / EKG changes 9/07             CABG (post-op A.Fib) 2009    RT and seeds for prostate cancer  Family History: Reviewed history from 06/11/2006 and no changes required. Father: Died 26 lung CA Mother: Alive 3 Siblings: 1 sister alive CV:  Paternal aunts and uncles died of MI's HBP:  ?? DM:  some  Social History: Widowed 2006 Stays with mom  alternate months (has MS) Children: 3 children Retired-- Engineer, maintenance Hobbies:  Gardens Quit cigars about 2003 Alcohol use-no  Review of Systems General:  weight fairly normal Sleeps fine wears seat belt. Eyes:  Denies double vision and vision loss-1 eye. ENT:  Complains of decreased hearing; denies ringing in ears; no change in decreased hearing Teeth okay--regular with dentist. CV:  Denies chest pain or discomfort, difficulty breathing at night, difficulty breathing while lying down, fainting, lightheadness, palpitations, and shortness of breath with exertion; walks regularly. Resp:  Denies cough and shortness of breath. GI:  Complains of bloody stools and change in bowel habits; denies abdominal pain, indigestion, nausea, and vomiting; bowels back to normal again now. GU:  Complains of erectile dysfunction; denies incontinence and urinary frequency; meds didn't help. MS:  Complains of joint pain; denies joint swelling; generalized joint aches. Derm:  Denies lesion(s) and rash. Neuro:  Complains of numbness and tingling; denies headaches and weakness; occ pain and sensory changes in feet. Psych:  Denies anxiety and depression. Heme:  Denies abnormal bruising and enlarge lymph nodes. Allergy:  Denies seasonal allergies and sneezing.  Physical Exam  General:  alert and normal appearance.   Eyes:  pupils equal, pupils round, and pupils reactive to light.   Ears:  R ear normal and  L ear normal.   Mouth:  no erythema and no exudates.   Neck:  supple, no masses, no thyromegaly, no carotid bruits, and no cervical lymphadenopathy.   Lungs:  normal respiratory effort, no intercostal retractions, no accessory muscle use, and normal breath sounds.   Heart:  normal rate, regular rhythm, no murmur, and no gallop.   Abdomen:  soft, non-tender, and no masses.   Msk:  no joint tenderness and no joint swelling.   Pulses:  1+ in feet Extremities:  no edema Neurologic:  alert &  oriented X3, strength normal in all extremities, and gait normal.   Skin:  no rashes, no suspicious lesions, and no ulcerations.   Axillary Nodes:  No palpable lymphadenopathy Psych:  normally interactive, good eye contact, not anxious appearing, and not depressed appearing.    Diabetes Management Exam:    Foot Exam (with socks and/or shoes not present):       Sensory-Pinprick/Light touch:          Left medial foot (L-4): diminished          Left dorsal foot (L-5): diminished          Left lateral foot (S-1): diminished          Right medial foot (L-4): diminished          Right dorsal foot (L-5): diminished          Right lateral foot (S-1): diminished       Inspection:          Left foot: abnormal             Comments: mild callous          Right foot: abnormal             Comments: mild callous, esp along great toe       Nails:          Left foot: thickened          Right foot: thickened   Impression & Recommendations:  Problem # 1:  PREVENTIVE HEALTH CARE (ICD-V70.0) Assessment Comment Only UTD with imms followed for prostate cancer Recent GI eval discussed fitness and proper eating  Problem # 2:  DIABETES MELLITUS, TYPE II, WITH NEUROLOGICAL COMPLICATIONS (ICD-250.60) Assessment: Deteriorated  control hasn't been right will increase lantus if still elevated  His updated medication list for this problem includes:    Glipizide 5 Mg Tabs (Glipizide) .Marland Kitchen... Take 1 by mouth once daily    Lantus 100 Unit/ml Soln (Insulin glargine) .Marland Kitchen... Take 65 units every evening    Hyzaar 50-12.5 Mg Tabs (Losartan potassium-hctz) .Marland Kitchen... Take one by mouth daily    Metformin Hcl 1000 Mg Tabs (Metformin hcl) .Marland Kitchen... 1 two times a day    Aspirin 325 Mg Tabs (Aspirin) .Marland Kitchen... Take 1 tablet by mouth once a day  Labs Reviewed: Creat: 0.9 (12/13/2008)     Last Eye Exam: No diabetic retinopathy.   Glaucoma.   Cataract.    (07/27/2009) Reviewed HgBA1c results: 9.2 (07/03/2009)  8.6  (12/13/2008)  Orders: TLB-A1C / Hgb A1C (Glycohemoglobin) (83036-A1C) TLB-Microalbumin/Creat Ratio, Urine (82043-MALB)  Problem # 3:  CORONARY ARTERY DISEASE (ICD-414.00) Assessment: Unchanged  quiet  His updated medication list for this problem includes:    Hyzaar 50-12.5 Mg Tabs (Losartan potassium-hctz) .Marland Kitchen... Take one by mouth daily    Carvedilol 25 Mg Tabs (Carvedilol) .Marland Kitchen... Take one tablet by mouth twice a day    Aspirin 325 Mg Tabs (Aspirin) .Marland Kitchen... Take  1 tablet by mouth once a day  Orders: TLB-Renal Function Panel (80069-RENAL) TLB-CBC Platelet - w/Differential (85025-CBCD) TLB-TSH (Thyroid Stimulating Hormone) (84443-TSH)  Problem # 4:  HYPERLIPIDEMIA (ICD-272.4) Assessment: Comment Only  has had trouble with meds in past must work on diet and exercise  Labs Reviewed: SGOT: 24 (12/13/2008)   SGPT: 36 (12/13/2008)   HDL:28.40 (12/13/2008), 29.20 (06/29/2008)  LDL:See Comment mg/dL (78/29/5621), 95 (30/86/5784)  Chol:156 (12/13/2008), 157 (06/29/2008)  Trig:284.0 (12/13/2008), 257.0 (06/29/2008)  Orders: TLB-Lipid Panel (80061-LIPID) TLB-Hepatic/Liver Function Pnl (80076-HEPATIC) Venipuncture ([address])  Complete Medication List: 1)  Glipizide 5 Mg Tabs (Glipizide) .... Take 1 by mouth once daily 2)  Lantus 100 Unit/ml Soln (Insulin glargine) .... Take 65 units every evening 3)  Hyzaar 50-12.5 Mg Tabs (Losartan potassium-hctz) .... Take one by mouth daily 4)  Metformin Hcl 1000 Mg Tabs (Metformin hcl) .Marland Kitchen.. 1 two times a day 5)  Carvedilol 25 Mg Tabs (Carvedilol) .... Take one tablet by mouth twice a day 6)  Aspirin 325 Mg Tabs (Aspirin) .... Take 1 tablet by mouth once a day 7)  Alphagan P 0.15 % Soln (Brimonidine tartrate) .... Use one drop in each eye daily 8)  Multivitamins Tabs (Multiple vitamin) .... Take one by mouth daily 9)  Onetouch Ultra Test Strp (Glucose blood) .... Check weekly 10)  Triamcinolone Acetonide 0.025 % Crea (Triamcinolone acetonide) .... Apply  three times a day as needed to irritated area  Patient Instructions: 1)  Please schedule a follow-up appointment in 6 months .    Orders Added: 1)  Est. Patient 40-64 years [[address]] 2)  TLB-A1C / Hgb A1C (Glycohemoglobin) [83036-A1C] 3)  TLB-Microalbumin/Creat Ratio, Urine [82043-MALB] 4)  TLB-Lipid Panel [80061-LIPID] 5)  TLB-Hepatic/Liver Function Pnl [80076-HEPATIC] 6)  Venipuncture [36415] 7)  TLB-Renal Function Panel [80069-RENAL] 8)  TLB-CBC Platelet - w/Differential [85025-CBCD] 9)  TLB-TSH (Thyroid Stimulating Hormone) [52841-LKG]    Current Allergies (reviewed today): ! ZOCOR (SIMVASTATIN) ! ZETIA (EZETIMIBE) ! LIPITOR (ATORVASTATIN CALCIUM)

## 2010-04-03 ENCOUNTER — Encounter: Payer: Self-pay | Admitting: Internal Medicine

## 2010-04-04 ENCOUNTER — Ambulatory Visit (INDEPENDENT_AMBULATORY_CARE_PROVIDER_SITE_OTHER): Payer: PRIVATE HEALTH INSURANCE | Admitting: Family Medicine

## 2010-04-04 ENCOUNTER — Encounter (HOSPITAL_BASED_OUTPATIENT_CLINIC_OR_DEPARTMENT_OTHER): Payer: PRIVATE HEALTH INSURANCE | Attending: General Surgery

## 2010-04-04 ENCOUNTER — Ambulatory Visit (HOSPITAL_COMMUNITY)
Admission: RE | Admit: 2010-04-04 | Discharge: 2010-04-04 | Disposition: A | Discharge status: Home / Self Care | Payer: PRIVATE HEALTH INSURANCE | Source: Ambulatory Visit | Attending: General Surgery | Admitting: General Surgery

## 2010-04-04 ENCOUNTER — Other Ambulatory Visit (HOSPITAL_BASED_OUTPATIENT_CLINIC_OR_DEPARTMENT_OTHER): Payer: Self-pay | Admitting: General Surgery

## 2010-04-04 ENCOUNTER — Encounter: Payer: Self-pay | Admitting: Family Medicine

## 2010-04-04 VITALS — BP 122/82 | HR 80 | Temp 98.2°F | Wt 289.1 lb

## 2010-04-04 DIAGNOSIS — I499 Cardiac arrhythmia, unspecified: Secondary | ICD-10-CM | POA: Insufficient documentation

## 2010-04-04 DIAGNOSIS — G4730 Sleep apnea, unspecified: Secondary | ICD-10-CM | POA: Insufficient documentation

## 2010-04-04 DIAGNOSIS — L98499 Non-pressure chronic ulcer of skin of other sites with unspecified severity: Secondary | ICD-10-CM

## 2010-04-04 DIAGNOSIS — E119 Type 2 diabetes mellitus without complications: Secondary | ICD-10-CM | POA: Insufficient documentation

## 2010-04-04 DIAGNOSIS — E1169 Type 2 diabetes mellitus with other specified complication: Secondary | ICD-10-CM

## 2010-04-04 DIAGNOSIS — Z79899 Other long term (current) drug therapy: Secondary | ICD-10-CM | POA: Insufficient documentation

## 2010-04-04 DIAGNOSIS — E11621 Type 2 diabetes mellitus with foot ulcer: Secondary | ICD-10-CM

## 2010-04-04 DIAGNOSIS — L97509 Non-pressure chronic ulcer of other part of unspecified foot with unspecified severity: Secondary | ICD-10-CM | POA: Insufficient documentation

## 2010-04-04 DIAGNOSIS — I872 Venous insufficiency (chronic) (peripheral): Secondary | ICD-10-CM | POA: Insufficient documentation

## 2010-04-04 DIAGNOSIS — Z794 Long term (current) use of insulin: Secondary | ICD-10-CM | POA: Insufficient documentation

## 2010-04-04 DIAGNOSIS — E669 Obesity, unspecified: Secondary | ICD-10-CM | POA: Insufficient documentation

## 2010-04-04 DIAGNOSIS — M869 Osteomyelitis, unspecified: Secondary | ICD-10-CM

## 2010-04-04 DIAGNOSIS — I10 Essential (primary) hypertension: Secondary | ICD-10-CM | POA: Insufficient documentation

## 2010-04-04 MED ORDER — CEPHALEXIN 500 MG PO CAPS: 500.0000 mg | ORAL_CAPSULE | Freq: Three times a day (TID) | ORAL | Status: AC

## 2010-04-04 MED ORDER — SULFAMETHOXAZOLE-TMP DS 800-160 MG PO TABS: 2.0000 | ORAL_TABLET | Freq: Two times a day (BID) | ORAL | Status: AC

## 2010-04-04 NOTE — Assessment & Plan Note (Addendum)
Concern for infection given some erythema that has started spreading although very pale around actual ulcer. Unable to probe bone, doubt osteo currently. Anticipate stage 2 ulcer.  Likely will need debridement.  Will set up urgent wound care eval (1-2 days). Start abx (bactrim and keflex to cover MRSA/strep).  Will have pt hold off on starting until eval by wound clinic in case they have other recs.  If poor response, low threshold to change keflex to augmentin for anaerobic coverage. Sending for culture, minimal drainage from wound. Tried to place in postop Reim/cam walker but not relieving pressure on big toe with these. Await wound care consult, return Friday for recheck. Given poor distal pulses, sent for ABIs to eval PAD.

## 2010-04-04 NOTE — Patient Instructions (Addendum)
Return on Friday for recheck with myself or Dr. Alphonsus Sias. You have a diabetic foot ulcer. Use post op Cuaresma to prevent pressure on that area. I have referred you to the wound care center for further management of the lesion. I have also referred you for evaluation of arterial disease of legs. Pass by Marion's office for referrals. Prescription for antibiotics sent to [facility], I'd like you to see wound care clinic prior to starting them.

## 2010-04-04 NOTE — Progress Notes (Signed)
Subjective:    Patient ID: Cydney Ok, male    DOB: February 19, 1950, 61 y.o.   MRN: [id number] CC: leg swelling, check place on foot.  HPI 59yo with h/o DM and CABG s/p vein harvesting from R leg presents with diabetic foot ulcer.  Cut toe 2 wks ago, thinks on tile in bathroom, not healing as expected.  Now notes redness spreading as well as swelling at ankle.  + h/o paresthesias bilat feet.  H/o CVI.  No claudication sxs.  Sugar usually runs around 200.  No h/o ulcer in past.  Last A1c 7.9% 12/2009.  Past Medical History  Diagnosis Date  . Malignant neoplasm of prostate   . Coronary atherosclerosis of unspecified type of vessel, native or graft   . Type II or unspecified type diabetes mellitus with neurological manifestations, not stated as uncontrolled   . Impotence of organic origin   . Unspecified glaucoma   . Internal hemorrhoids without mention of complication   . Other and unspecified hyperlipidemia   . Mononeuritis of unspecified site   . Osteoarthrosis, unspecified whether generalized or localized, unspecified site   . Personal history of colonic polyps   . Routine general medical examination at a health care facility   . Hemorrhage of rectum and anus   . Unspecified sleep apnea   . Unspecified venous (peripheral) insufficiency   . Personal history of gallstones    Past Surgical History  Procedure Date  . Cardiac catheterization 1998    Negative  . Thrombosed vein 1993    Right leg  . Kidney stone surgery 04/1993  . Retinal detachment surgery 2002-2003  . Rca stents 04/2003    EF 55%  . Coronary artery bypass graft 09/2005    Post op AFIB  . Insertion prostate radiation seed 2009    RT and seeds for prostate cancer    reports that he quit smoking about 9 years ago. His smoking use included Cigars. He does not have any smokeless tobacco history on file. He reports that he does not drink alcohol or use illicit drugs. family history includes Heart attack in an  unspecified family member; Lung cancer in his father; and Multiple sclerosis in his mother. Allergies  Allergen Reactions  . Atorvastatin     REACTION: myalgias  . Ezetimibe   . Simvastatin    Current Outpatient Prescriptions on File Prior to Visit  Medication Sig Dispense Refill  . aspirin 325 MG tablet Take 325 mg by mouth daily.        . brimonidine (ALPHAGAN) 0.15 % ophthalmic solution Place 1 drop into both eyes daily.        . carvedilol (COREG) 25 MG tablet Take 25 mg by mouth 2 (two) times daily with a meal.        . glipiZIDE (GLUCOTROL) 5 MG tablet Take 5 mg by mouth daily.        Marland Kitchen glucose blood test strip 1 each by Other route as directed. Once weekly (Onetouch Ultra)       . insulin glargine (LANTUS) 100 UNIT/ML injection Inject 65 Units into the skin at bedtime.        Marland Kitchen losartan-hydrochlorothiazide (HYZAAR) 50-12.5 MG per tablet Take 1 tablet by mouth daily.        . metFORMIN (GLUCOPHAGE) 1000 MG tablet Take 1,000 mg by mouth 2 (two) times daily with a meal.        . Multiple Vitamin (MULTIVITAMIN) tablet Take 1 tablet by mouth daily.        Marland Kitchen  triamcinolone (KENALOG) 0.025 % cream Apply 1 application topically 3 (three) times daily as needed.         Review of Systems Per HPI    Objective:   Physical Exam  Constitutional: He appears well-developed and well-nourished. No distress.  Cardiovascular: Normal rate, regular rhythm and normal heart sounds.   No murmur heard.      Weak DP bilaterally, unable to palpate PT.  Pulmonary/Chest: Effort normal and breath sounds normal. No respiratory distress. He has no wheezes. He has no rales.  Musculoskeletal:       Feet:  Skin:       +CVI changes bilateral legs See msk exam.      Assessment & Plan:

## 2010-04-05 ENCOUNTER — Encounter: Payer: Self-pay | Admitting: Internal Medicine

## 2010-04-06 ENCOUNTER — Ambulatory Visit (INDEPENDENT_AMBULATORY_CARE_PROVIDER_SITE_OTHER): Payer: PRIVATE HEALTH INSURANCE | Admitting: Family Medicine

## 2010-04-06 ENCOUNTER — Encounter: Payer: Self-pay | Admitting: Family Medicine

## 2010-04-06 VITALS — BP 124/80 | HR 76 | Temp 97.7°F | Wt 292.0 lb

## 2010-04-06 DIAGNOSIS — E1149 Type 2 diabetes mellitus with other diabetic neurological complication: Secondary | ICD-10-CM

## 2010-04-06 DIAGNOSIS — E11621 Type 2 diabetes mellitus with foot ulcer: Secondary | ICD-10-CM

## 2010-04-06 DIAGNOSIS — L98499 Non-pressure chronic ulcer of skin of other sites with unspecified severity: Secondary | ICD-10-CM

## 2010-04-06 DIAGNOSIS — E1169 Type 2 diabetes mellitus with other specified complication: Secondary | ICD-10-CM

## 2010-04-06 MED ORDER — INSULIN GLARGINE 100 UNIT/ML ~~LOC~~ SOLN: 70.0000 [IU] | Freq: Every day | SUBCUTANEOUS | Status: DC

## 2010-04-06 NOTE — Progress Notes (Signed)
  Subjective:    Patient ID: [name], male    DOB: [birth year], 60 y.o.   MRN: [id number]  HPI CC: f/u foot ulcer  Seen 2d ago with diabetic foot ulcer, concern for superimposed cellulitis 2/2 erythema/warmth.  Able to get into wound care clinic same day, wound debrided and irrigated, started on keflex/bactrim.  States they checked xray as well.  Tolerating abx well, minimal diarrhea.    States sugars running 140-180s, sometimes >200.  A1c last checked 12/2009, 7.9%.  Compliant with glipizide 5mg  daily, lantus 65u nightly, and metformin 1000 bid.  States trying to cut out all carbs and fruits while wound heals.  No fevers/chills, n/v.  No discharge, no spreading redness.  Review of Systems Per HPI.      Objective:   Physical Exam  Constitutional: He appears well-developed and well-nourished. No distress.  Cardiovascular:  Pulses:      Dorsalis pedis pulses are 1+ on the right side, and 1+ on the left side.  Musculoskeletal:       Feet:  Skin:       +CVI changes bilateral legs See msk exam.          Assessment & Plan:

## 2010-04-06 NOTE — Assessment & Plan Note (Addendum)
Increase lantus - hopeful for better sugar control. Discussed importance of glycemic control in setting of ulcer/infection.

## 2010-04-06 NOTE — Assessment & Plan Note (Signed)
Improved pain, swelling, erythema after debridement and on abx.   Covering staph/strep.  If not improving as expected, low threshold to change Keflex to Augmentin for better anaerobe coverage.  Currently seems to be healing well. Increase lantus for better sugar control. Awaiting ABI scheduled for next week to eval for PAD (diminished pulses). Appreciate excellent care provided by wound clinic. Continue to f/u with them, if any concerns or questions , may return to see [place].  Keep appt with Dr. Alphonsus Sias.

## 2010-04-06 NOTE — Patient Instructions (Signed)
Increase Lantus to 70 units nightly, 1 unit every 2-3 days until 70. Continue both antibiotics, bactrim with food. Follow up with wound care clinic. I'm glad wound is looking better. Please return if any concerns, otherwise keep appointment with Dr. Alphonsus Sias. We will contact you with results of ABI.

## 2010-04-07 LAB — WOUND CULTURE

## 2010-04-09 NOTE — Progress Notes (Signed)
Patient notified and will make sure to finish abx.

## 2010-04-10 ENCOUNTER — Encounter (HOSPITAL_BASED_OUTPATIENT_CLINIC_OR_DEPARTMENT_OTHER): Payer: PRIVATE HEALTH INSURANCE | Attending: General Surgery

## 2010-04-10 DIAGNOSIS — L97509 Non-pressure chronic ulcer of other part of unspecified foot with unspecified severity: Secondary | ICD-10-CM | POA: Insufficient documentation

## 2010-04-10 DIAGNOSIS — E669 Obesity, unspecified: Secondary | ICD-10-CM | POA: Insufficient documentation

## 2010-04-10 DIAGNOSIS — I10 Essential (primary) hypertension: Secondary | ICD-10-CM | POA: Insufficient documentation

## 2010-04-10 DIAGNOSIS — I872 Venous insufficiency (chronic) (peripheral): Secondary | ICD-10-CM | POA: Insufficient documentation

## 2010-04-10 DIAGNOSIS — G4730 Sleep apnea, unspecified: Secondary | ICD-10-CM | POA: Insufficient documentation

## 2010-04-10 DIAGNOSIS — E119 Type 2 diabetes mellitus without complications: Secondary | ICD-10-CM | POA: Insufficient documentation

## 2010-04-10 DIAGNOSIS — Z794 Long term (current) use of insulin: Secondary | ICD-10-CM | POA: Insufficient documentation

## 2010-04-10 DIAGNOSIS — I499 Cardiac arrhythmia, unspecified: Secondary | ICD-10-CM | POA: Insufficient documentation

## 2010-04-10 DIAGNOSIS — Z79899 Other long term (current) drug therapy: Secondary | ICD-10-CM | POA: Insufficient documentation

## 2010-04-12 ENCOUNTER — Encounter (INDEPENDENT_AMBULATORY_CARE_PROVIDER_SITE_OTHER): Payer: PRIVATE HEALTH INSURANCE | Admitting: Cardiology

## 2010-04-12 DIAGNOSIS — E11621 Type 2 diabetes mellitus with foot ulcer: Secondary | ICD-10-CM

## 2010-04-12 DIAGNOSIS — E1159 Type 2 diabetes mellitus with other circulatory complications: Secondary | ICD-10-CM

## 2010-04-12 DIAGNOSIS — L98499 Non-pressure chronic ulcer of skin of other sites with unspecified severity: Secondary | ICD-10-CM

## 2010-04-19 ENCOUNTER — Encounter: Payer: Self-pay | Admitting: Family Medicine

## 2010-05-03 ENCOUNTER — Ambulatory Visit: Payer: PRIVATE HEALTH INSURANCE | Admitting: Cardiovascular Disease

## 2010-05-08 ENCOUNTER — Encounter (HOSPITAL_BASED_OUTPATIENT_CLINIC_OR_DEPARTMENT_OTHER): Payer: PRIVATE HEALTH INSURANCE | Attending: General Surgery

## 2010-05-08 DIAGNOSIS — E1142 Type 2 diabetes mellitus with diabetic polyneuropathy: Secondary | ICD-10-CM | POA: Insufficient documentation

## 2010-05-08 DIAGNOSIS — Z794 Long term (current) use of insulin: Secondary | ICD-10-CM | POA: Insufficient documentation

## 2010-05-08 DIAGNOSIS — E1149 Type 2 diabetes mellitus with other diabetic neurological complication: Secondary | ICD-10-CM | POA: Insufficient documentation

## 2010-05-08 DIAGNOSIS — Z79899 Other long term (current) drug therapy: Secondary | ICD-10-CM | POA: Insufficient documentation

## 2010-05-08 DIAGNOSIS — E1169 Type 2 diabetes mellitus with other specified complication: Secondary | ICD-10-CM | POA: Insufficient documentation

## 2010-05-08 DIAGNOSIS — L97509 Non-pressure chronic ulcer of other part of unspecified foot with unspecified severity: Secondary | ICD-10-CM | POA: Insufficient documentation

## 2010-05-08 DIAGNOSIS — C61 Malignant neoplasm of prostate: Secondary | ICD-10-CM | POA: Insufficient documentation

## 2010-05-08 DIAGNOSIS — I2510 Atherosclerotic heart disease of native coronary artery without angina pectoris: Secondary | ICD-10-CM | POA: Insufficient documentation

## 2010-05-08 DIAGNOSIS — I10 Essential (primary) hypertension: Secondary | ICD-10-CM | POA: Insufficient documentation

## 2010-05-15 ENCOUNTER — Other Ambulatory Visit (HOSPITAL_COMMUNITY): Payer: Self-pay | Admitting: Internal Medicine

## 2010-05-22 NOTE — Assessment & Plan Note (Signed)
[facility]                         GASTROENTEROLOGY OFFICE NOTE   NAME:SHOEWaris, [name]                        MRN:          [id number]  DATE:01/27/2007                            DOB:          1950/03/19    REASON FOR CONSULTATION:  Colorectal cancer screening with coronary  artery disease.   HISTORY OF PRESENT ILLNESS:  [name] is a 60 year old white male who  has stable coronary artery disease status post CABG.  He is followed by  Dr. Charlton Haws.  He relates a history of hemorrhoids in the past, but  they have not been symptomatic for several years.  He relates no  abdominal pain, rectal pain, weight loss, changes in bowel habits,  changes in stool caliber, melena, or hematochezia.  There is no family  history of colon cancer, colon polyps, or inflammatory bowel disease.   PAST MEDICAL HISTORY:  1. Coronary artery disease  2. Status post CABG in September 2006.  3. Status post right coronary artery stent placement x2.  4. Diabetes mellitus type 2.  5. Obesity.  6. Hyperlipidemia.  7. Sleep apnea.  8. Glaucoma.  9. Cholelithiasis.  10.History of kidney stones.  11.Status post appendectomy.  12.Status post retinal detachment.   CURRENT MEDICATIONS:  Listed on the chart, updated and reviewed.   MEDICATION ALLERGIES:  NONE KNOWN.   SOCIAL HISTORY:  Per the handwritten form.   REVIEW OF SYSTEMS:  Per the handwritten form.   PHYSICAL EXAMINATION:  Obese white male in no acute distress.  Height 6 feet 2 inches.  Weight 296.2 pounds.  Blood pressure is 160/86.  Pulse 84 and regular.  HEENT EXAM:  Anicteric sclerae.  Oropharynx clear.  CHEST:  Clear to auscultation bilaterally.  CARDIAC:  Regular rate and rhythm without murmurs.  ABDOMEN:  Large, soft, nontender, non-distended, normoactive bowel  sounds.  No palpable organomegaly, masses, or hernias.  RECTAL:  Examination deferred to the time of colonoscopy.  Recent exam  by Dr. Alphonsus Sias  was unremarkable.  EXTREMITIES:  Without clubbing, cyanosis, or edema.  NEUROLOGIC:  Alert and oriented x3.  Grossly nonfocal.   ASSESSMENT AND PLAN:  Average risk for colorectal cancer.  Stable  coronary artery disease, status post coronary artery bypass graft.  Risks, benefits, and alternatives to colonoscopy with possibly biopsy  and possible polypectomy discussed with the patient, and he consents to  proceed.  This will be scheduled electively.     Venita Lick. Russella Dar, MD, [facility]  Electronically Signed    MTS/MedQ  DD: 01/27/2007  DT: 01/27/2007  Job #: [id number]   cc:   Karie Schwalbe, MD

## 2010-05-22 NOTE — Assessment & Plan Note (Signed)
[facility]                            CARDIOLOGY OFFICE NOTE   NAME:[name], [name]                        MRN:          [id number]  DATE:11/18/2006                            DOB:          Jan 23, 1950    SUBJECTIVE:  [name] returns today for followup.   HISTORY:  The patient is status post coronary artery bypass graft  surgery in September.  He is a diabetic who is overweight.  He has been  somewhat tachycardic and hypertensive at cardiac rehab.  He has good LV  function.   In talking to the patient, he has noted occasional palpitations.  There  has been no atrial fibrillation.  No syncope; however, he did not  restart his Coreg which we asked him to.  Apparently he wanted to talk  to me about this first.  I explained to the patient that he was somewhat  hypertensive and tachycardic at cardiac rehab.  He is a diabetic.  I  prefer him to go back on Coreg 12.5 mg twice daily.  He is willing to  try this.   In terms of his risk factor modification, he has been intolerant to all  statin drug, with significant leg pain and myalgias.  I told him we  would try Niaspan, to see if we can get his cholesterol in a little  better range.  When last checked, his LDL cholesterol was 95.  He had a  low HDL and elevated triglycerides, consistent with diabetes.   Otherwise he is doing well.  He is active.  He collects train engines  and old steam products.  He enjoys his hobby and travels quite a bit,  particularly to [place] for this.  He continues to work on a  regular basis.   REVIEW OF SYSTEMS:  Is otherwise negative.   CURRENT MEDICATIONS:  1. Aspirin daily.  2. Alphagan.  3. CPAP machine.  4. Metformin 500 mg twice daily.  5. Coreg 12.5 mg twice daily, to be restarted.  6. Niaspan 500 mg daily, to be started.  7. Hyzaar 50/12.5 mg.  He uses the [facility] in [place] on [address].   PHYSICAL EXAMINATION:  GENERAL:  An overweight white male, in  no  distress.  VITAL SIGNS:  Weight 291 pounds, blood pressure 149/92, pulse 97 and  regular, respirations 14, afebrile.  HEENT:  Unremarkable.  NECK:  Carotids normal without bruits.  No lymphadenopathy, thyromegaly  or JVP elevation.  LUNGS:  Clear with good diaphragmatic motion.  No wheezing.  Status post  sternotomy, which is well-healed.  HEART:  S1 and S2.  Normal heart sounds.  PMI not palpable.  ABDOMEN:  Protuberant.  Bowel sounds positive.  No tenderness, no  abdominal aortic aneurysm, no bruits, no hepatosplenomegaly.  No  hepatojugular reflux.  EXTREMITIES:  Femorals +3, PT +3.  No lower extremity edema.  NEUROLOGIC:  Nonfocal.  SKIN:  Warm and dry.  MUSCULOSKELETAL: No muscular weakness.   Electrocardiogram showed sinus tachycardia at a rate of 99 with left  ventricular dysfunction and nonspecific ST-T wave changes.   IMPRESSION/PLAN:  1. Status post coronary artery bypass graft surgery.  Difficult risk      factor modification.  Continue aspirin.  Start beta blocker backup.      Continue cardiac rehab.  2. Relative hypertension and tachycardia:  Start Coreg 12.5 mg twice      daily.  3. Hypercholesterolemia, intolerant of statins:  Start Niaspan 500 mg      daily.  To take aspirin 30 minutes prior to.  Escalate dose as      tolerated.  4. Diabetes:  Follow up with primary care MD.  Hemoglobin A1c      quarterly.  Continue current medications.  Target hemoglobin A1c 6.  5. Obesity:  I talked to [facility] at length.  He really needs to lose 10-      20 pounds.  I explained to him that this would help his sleep      apnea, his diabetes and his resting tachycardia and hypertension.      He does not seem real motivated to do this.  We went over a low-      caloric diabetic diet as well.   FOLLOWUP:  Overall, however, I think he is stable and I will see him  back in six months.  He will call us if he has any problems resuming his  Coreg and starting Niaspan.     Noralyn Pick. Eden Emms, MD, [facility]  Electronically Signed    PCN/MedQ  DD: 11/18/2006  DT: 11/18/2006  Job #: [id number]

## 2010-05-22 NOTE — Op Note (Signed)
NAMEPRABHAV, FAULKENBERRY                 ACCOUNT NO.:  [id number]   MEDICAL RECORD NO.:  [id number]          PATIENT TYPE:  AMB   LOCATION:  [facility]                         FACILITY:  [facility]   PHYSICIAN:  Heloise Purpura, MD      DATE OF BIRTH:  [birth year]   DATE OF PROCEDURE:  10/26/2007  DATE OF DISCHARGE:                               OPERATIVE REPORT   PREOPERATIVE DIAGNOSIS:  Clinically localized adenocarcinoma of the  prostate.   POSTOPERATIVE DIAGNOSIS:  Clinically localized adenocarcinoma of the  prostate.   PROCEDURES:  1. Transperineal placement of radiation seeds into the prostate.  2. Flexible cystoscopy.   SURGEON:  Dr. Heloise Purpura.   RADIATION ONCOLOGIST:  Dr. Oneita Hurt   ANESTHESIA:  General.   COMPLICATIONS:  None.   ESTIMATED BLOOD LOSS:  Minimal.   INDICATIONS:  Mr. Matich is a 60 year old gentleman with multiple medical  comorbidities who was recently diagnosed with high-risk clinically  localized adenocarcinoma of the prostate.  After an extensive discussion  regarding his management options for treatment, he elected to proceed  with a combination of external beam radiation therapy in conjunction  with a radiation seed implant boost to the prostate as well as short-  term androgen deprivation.  He presents today after completing his IMRT  and is now scheduled for his prostate radiation seed boost.  The  potential risks, complications, and alternative options associated with  this procedure have been explained and informed consent has been  obtained.   DESCRIPTION OF PROCEDURE:  The patient was taken to the operating room  and a general anesthetic was administered.  He was given preoperative  antibiotics, placed in the dorsal lithotomy position, and prepped and  draped in the usual sterile fashion.  Under transrectal ultrasound  guidance, the prostate was visualized and an intraoperative plan for  placement of the radiation seeds was performed by Dr.  Kathrynn Running.  Once the  intraoperative plan was formulated, the iodine-125 radiation seeds were  then implanted into the prostate according to the plan under transrectal  ultrasound guidance.  A total of 24 needles were used to deliver a total  of 65 seeds with a treatment dose of 110 Gy.  Following implantation of  the seeds, flexible cystoscopy was performed which demonstrated no  seeds within the urethra or bladder.  A 16-French Foley catheter was  then reinserted into the bladder and the procedure was ended.  He  tolerated the procedure well and without complications.  He was able to  be awakened and transferred to the recovery unit in satisfactory  condition.      Heloise Purpura, MD  Electronically Signed     LB/MEDQ  D:  10/26/2007  T:  10/26/2007  Job:  [id number]

## 2010-05-22 NOTE — Assessment & Plan Note (Signed)
[facility]                            CARDIOLOGY OFFICE NOTE   NAME:SHOESimcha, Speir                        MRN:          [id number]  DATE:07/09/2007                            DOB:          [birth year]    HISTORY:  [name] returns today for followup.  He is status post a CABG  in September 2008.   The patient has normal ejection fraction.   His bypass was done.  I believe in 2007 he had LIMA to the LAD,  sequential vein graft to the PDA, PLA vein graft to the OM, and a vein  graft to the diagonal.   He is a type 2 diabetic with poor diet.  He continues to be essentially  obese.   He was diagnosed with prostate cancer.  This year, he initially had been  seen by Dr. Boston Service and then by Dr. Laverle Patter.  PSA rose to a  maximum of 13.  He used to have seed implants and external beam  radiation.   From a cardiac standpoint, he has been stable.  He has not had chest  pain, PND, orthopnea or palpitations.  He has chronic exertional dyspnea  due to his weight and also sleep apnea.  He has a CPAP machine.  Diabetes appears to have been poorly controlled, but he does not follow  it regularly at home, and I do not have a hemoglobin A1c on him.   REVIEW OF SYSTEMS:  Otherwise negative.   MEDICATIONS:  1. He takes an aspirin every day.  2. CPAP machine.  3. Lantus as directed.  4. Coreg 12.5 b.i.d.  5. Hyzaar 50.  6. Multivitamins 12.5.  7. Niacin.  8. Metformin 1 g b.i.d.   PHYSICAL EXAMINATION:  GENERAL:  His exam is remarkable for an  overweight, white male in no distress.  VITAL SIGNS:  Pulse is somewhat elevated at 95, blood pressure is  160/90, weight 295, respiratory rate 14, and afebrile.  Affect  appropriate.  HEENT:  Unremarkable.  Carotids normal without bruit. No  lymphadenopathy, thyromegaly, and no JVP elevation.  LUNGS: Clear, good diaphragmatic motion. No wheezing.  CHEST:  S1 and S2 with normal heart sounds. Sternum well  healed with  little bit of a keloid formation.  PMI not palpable.  ABDOMEN:  Protuberant.  Bowel sounds positive.  No AAA.  No tenderness.  No bruit.  No hepatosplenomegaly.  No hepatojugular reflux.  EXTREMITIES:  Distal pulses intact.  No trace edema.  NEURO:  Nonfocal.  SKIN:  Warm and dry.  MUSCULOSKELETAL:  No muscular weakness.   IMPRESSION:  1. Coronary artery disease, previous coronary artery bypass graft.      Continue aspirin and beta blocker.  2. Hypertension.  Continue angiotensin-converting enzyme inhibitor.      Continue ARB.  Increase Coreg to 25 b.i.d.  Follow up in 6 to 8      weeks and reassess.  I suspect we can also go up on his Hyzaar to      100 mg.  3. Central obesity with type 2 diabetes.  I  talked to the patient at      length about his diet and taking this seriously.  Clearly, his      central obesity is causing his insulin resistance.  He will      continue his current medications, but unless he loses weight, his      diabetes will always be difficult to control.  4. Hyperlipidemia.  The patient has been previously intolerant to      statins and Zetia.  We will currently continue his niacin.  Lipid      and liver profile in 6 months.  5. Prostate cancer.  Follow up with Dr. Laverle Patter, clear to have any      external beam radiation that he needs.  Follow up prostate-specific      antigen serially.     Noralyn Pick. Eden Emms, MD, [facility]  Electronically Signed    PCN/MedQ  DD: 07/09/2007  DT: 07/10/2007  Job #: [id number]

## 2010-05-25 ENCOUNTER — Encounter: Payer: Self-pay | Admitting: Family Medicine

## 2010-05-25 NOTE — Assessment & Plan Note (Signed)
[facility]                            CARDIOLOGY OFFICE NOTE   NAME:[name], [name]                        MRN:          [id number]  DATE:01/23/2006                            DOB:          [birth year]    [name] returns today for followup.  He had CABG back in September.   The patient's postop course has been a little bit complicated.  He had  some volume overload in the lower extremities.  He has had some elevated  blood pressures at cardiac rehab.  His sugars have been somewhat  difficult to control.  When we last saw him, we adjusted his medications  and started him on Hyzaar.   The patient initially was evaluated for erectile dysfunction, and he was  told he could not have Viagra.  This led to a vascular workup and heart  cath which showed three vessel disease with reasonable LV function.   The patient does heavy equipment work.  I told him I thought he was well  enough to go back to work now.  It took approximately 10 minutes to fill  out paperwork, including his previous disability forms.   The patient's weight is stable but still too heavy.  We talked about his  diet some.  His review of systems are remarkable for no significant  chest pain.  His PND and orthopnea is improved.  He still has chronic  lower extremity edema.   MEDICATIONS:  1. An aspirin a day.  2. Metformin 500 b.i.d.  3. Lantus as directed.  4. Tricor 145 a day.  5. Hyzaar 50/12.5.   He was intolerant to COREG, causing marked fatigue.  The patient also  has been intolerant of STATIN drugs in the past with significant  myalgias.   PHYSICAL EXAMINATION:  VITAL SIGNS:  Weight is 291, blood pressure  122/68, pulse 76 and regular.  HEENT:  Normal.  NECK:  Carotids are normal.  There is no JVP elevation.  LUNGS:  Clear.  Sternum is well healed.  ABDOMEN:  Benign.  LOWER EXTREMITIES:  Intact pulses with venous insufficiency varicosities  and +1-2 edema bilaterally.   IMPRESSION:  Stable status post coronary artery bypass graft, good left  ventricular function.  Risk factor modification somewhat limited by  inability to tolerate statins.  We have him on Tricor 145 a day, and he  is watching his diet carefully, both in terms of his fat content and  sugar.  His medical doctor will follow his hemoglobin A1C.  He will  continue his Metformin.   In regards to his blood pressure, it is much better controlled on  Hyzaar.  This will be great in terms of decreasing proteinuria and  protecting his kidneys as well.   He may end up needing a higher dose of diuretic, and we will see him  back in a few months to assess this.  He has chronic lower extremity  edema from varicosities.  Currently will continue his 12.5 mg of  diuretic, which are seen with his Hyzaar.   His sleep apnea is chronic.  He is using a CPAP machine at night, but  this is probably also causing some reactive pulmonary hypertension and  lower extremity edema.   I think he is well enough to go back to work.  I will see him back in  about three months, and we will further assess his edema, blood  pressure, cholesterol, and triglycerides.     Noralyn Pick. Eden Emms, MD, [facility]  Electronically Signed    PCN/MedQ  DD: 01/23/2006  DT: 01/23/2006  Job #: [id number]

## 2010-05-25 NOTE — Assessment & Plan Note (Signed)
[facility]                              CARDIOLOGY OFFICE NOTE   NAME:[name], [name]                      MRN:          [id number]  DATE:09/05/2005                            DOB:          [birth year]    [name] is seen today at the request of Dr. Alphonsus Sias.  The patient has a  history of fairly mild coronary disease by cath in 2002.  He had a distal  tapering 50% left main stenosis.   I believe his cath was in April of 2005 with an EF of 55%.  He has  significant sleep apnea.   The patient has not had any significant chest pain.  His blood pressure has  been treated with Toprol.   He is on aspirin and Plavix.   The patient's wife died 2 years ago.  He has 3 adult children.  Apparently,  he has a new girlfriend.  He was interested in taking Viagra.  Dr. Alphonsus Sias  thought he should see [place] prior to this.  I would agree that he needs a  stress test prior to this from a cardiac perspective.  He continues to lose  weight.  He used to weight 303 pounds back in March of 2005, and is down to  277.  I congratulated him on this.  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   The patient has been intolerant to Zetia and statins in the past.   REVIEW OF SYSTEMS:  Remarkable for his sleep apnea and some exertional  dyspnea.  He has not had any chest pain, PND, orthopnea, or lower extremity  edema.   PHYSICAL EXAMINATION:  VITAL SIGNS:  The blood pressure is 140/80, pulse 75  and regular.  LUNGS:  Clear.  NECK:  Carotids are normal.  HEART:  There is an S1 and S2.  Normal heart tones.  ABDOMEN:  Benign.  LOWER EXTREMITIES:  __________ pulse.  No edema.   ELECTROCARDIOGRAM:  Normal.   IMPRESSION:  Moderate coronary disease in the past.  Continue aspirin,  Plavix and beta blocker.  Normal ECG.  Interested in Viagra use,  particularly with wife's death and new girlfriend after mourning for 2  years.  We will schedule him for a stress Myoview.  If this is nonischemic,  I will let Dr. Alphonsus Sias prescribe him Cialis or Viagra.   Since the patient did have some distal left main disease, we will have a low  threshold to re-cath him, should his Myoview be abnormal in any way.  He  will continue his aspirin, Plavix and beta blocker.                               Noralyn Pick. Eden Emms, MD, [facility]    PCN/MedQ  DD:  09/05/2005  DT:  09/06/2005  Job #:  [id number]

## 2010-05-25 NOTE — Assessment & Plan Note (Signed)
[facility]                              CARDIOLOGY OFFICE NOTE   NAME:[name], [name]                        MRN:          [id number]  DATE:11/14/2005                            DOB:          Mar 05, 1950    [name] returns today for followup.  He is status post bypass surgery.  He  had a non-ischemic Myoview but dropped his blood pressure and we felt he  needed a cath.  He had 3-vessel disease.  Dr. Tyrone Sage operated on him  without difficulty.  Apparently, the patient has gall stones and needs a  possible cholecystectomy.  I told him to wait until after December 12, this  would be about 3 months post CABG.  He is cleared for cholecystectomy after  mid December.  His wounds have healed well.  His sugar is under better  control.  He continues to ask about Viagra.  I told him it is my philosophy  not to give it to people who have had coronary artery disease or bypass.  He  still has distal diabetic disease.  I have told him he could try a  sublingual nitro when he was intimate with his girlfriend to see if this  would help, but I would not prescribe him Cialis or Viagra.   EXAM:  He is in sinus rhythm.  Blood pressure is 130/70.  SKIN:  Warm and dry.  LUNGS:  Clear.  Carotids are normal.  Sternum is well-healed.  HEENT:  Normal.  There is an S1, S2 without rub.  ABDOMEN:  Benign.  LOWER EXTREMITIES:  Intact pulses.  No edema.   EKG:  Sinus rhythm with LVH and nonspecific ST-T wave changes.   MEDICATIONS:  Listed on the chart.   IMPRESSION:  Stable status post coronary artery bypass grafting.  No  arrhythmia.  Lung fields are clear.  The patient can have his gallbladder  surgery mid December or later.  He would like to get this done before the  end of the year in terms of his co-pays.  From our perspective I gave him a  prescription for sublingual nitro.  He is to undergo cardiac rehab at [facility].  Overall, I am pleased with his progress and I am glad  he ended up having a  cath for abnormal hemodynamics during his stress test.    ______________________________  Noralyn Pick. Eden Emms, MD, [facility]    PCN/MedQ  DD: 11/14/2005  DT: 11/14/2005  Job #: [id number] [id number]

## 2010-05-25 NOTE — Cardiovascular Report (Signed)
NAME:  [name], [name]                           ACCOUNT NO.:  [id number]   MEDICAL RECORD NO.:  [id number]                   PATIENT TYPE:  OIB   LOCATION:  6522                                 FACILITY:  [facility]   PHYSICIAN:  Carole Binning, M.D. [facility]         DATE OF BIRTH:  1950-05-20   DATE OF PROCEDURE:  05/03/2003  DATE OF DISCHARGE:  05/04/2003                              CARDIAC CATHETERIZATION   PROCEDURE:  Percutaneous transluminal coronary angioplasty with placement of  drug-eluting stent x 2 in the proximal to mid right coronary artery.   CARDIOLOGIST:  Carole Binning, M.D.   INDICATIONS:  [name] is a 60 year old male who presented with chest pain.  A stress Cardiolite showed inferior ischemia.  Cardiac catheterization  performed by Dr. Eden Emms several days ago revealed a very complex long 80%  stenosis beginning in the proximal and extending through the length of the  mid right coronary artery.  There was an area of an ulceration which  appeared to be a spontaneous dissection.  The patient returned today for  elective percutaneous intervention.   DESCRIPTION OF PROCEDURE:  A 6 French sheath was placed in the right femoral  artery.  Heparin and Integrilin were administered per protocol.  We used a 6  [place] guiding catheter.  An Asahi soft coronary guidewire was advanced  under fluoroscopic guidance into the distal right coronary artery.  We  positioned a 2.5 x 23 mm Cypher drug-eluting stent across the area of  disease extending from the proximal mid vessel and deployed the stent at 18  atmospheres.  We then went in with a 3.5 x 15 mm Quantum balloon positioning  this in the mid aspect of the stent inflating it 18 atmospheres and then in  the proximal aspect of the stent inflating it to 18 atmospheres.  There was  an area of ulceration and ectasia in the mid vessel across which the stent  was placed.  We positioned a 4.0 x 12 mm Quantum balloon across this area  of  ectasias and inflated this wound to 12 and then 16 atmospheres.  Angiographic images at that point revealed a satisfactory result at the  stent site.  However, the vessel beyond that appeared to have significant  residual disease.  We therefore advanced a second 3.0 x 18 mm Cypher stent  and positioned this in the mid vessel just beyond the first stent with  slight overlap of the two stents.  This stent was deployed at 16  atmospheres.  We then pulled our stent delivery balloon back proximally into  the area of stent overlapping and inflated the balloon to 18 atmospheres.  We then went back in with her 4.0 x 12 mm Quantum balloon, positioning this  in the mid portion of the vessel inflating it to 12 atmospheres.  We then  advanced to the area of the stent overlap in the mid to  distal vessel and  inflated it to 10 atmospheres.  Final angiographic images were obtained  revealing patency of the right coronary artery with 0% residual stenosis at  the stent site and TIMI-3 flow into the distal vessel.   COMPLICATIONS:  None.   RESULTS:  Successful percutaneous coronary intervention with placement of  the drug-eluting stent x2 in the proximal and mid right coronary artery.  A  diffuse complex 80% stenosis was reduced to 0% residual TIMI-3 flow.   PLAN:  At the completion of the procedure, an AngioSeal basket closure  device was placed in the right femoral artery with good hemostasis.  The  patient will be continued on Integrilin overnight.  It is recommended he be  treated with Plavix for a minimum of 6 months, preferably 12 months.                                               Carole Binning, M.D. [facility]    MWP/MEDQ  D:  05/03/2003  T:  05/04/2003  Job:  [id number]   cc:   Gerlene Burdock M.D. __________   Charlton Haws, M.D.   Cardiac Catheterizaiton Lab

## 2010-05-25 NOTE — Op Note (Signed)
[name], [name]                 ACCOUNT NO.:  [id number]   MEDICAL RECORD NO.:  [id number]          PATIENT TYPE:  INP   LOCATION:  2015                         FACILITY:  [facility]   PHYSICIAN:  Sheliah Plane, MD    DATE OF BIRTH:  12-18-1950   DATE OF PROCEDURE:  09/30/2005  DATE OF DISCHARGE:                                 OPERATIVE REPORT   PREOPERATIVE DIAGNOSES:  Coronary occlusive disease.   POSTOPERATIVE DIAGNOSES:  Coronary occlusive disease.   OPERATION PERFORMED:  Coronary artery bypass grafting times five with the  left internal mammary artery to the left anterior descending coronary  artery, sequential reverse saphenous vein graft to the posterior descending  and posterolateral branches of the right coronary artery, reversed saphenous  vein graft to the obtuse marginal, reversed saphenous vein to the diagonal  with right leg Endo vein harvesting.   SURGEON:  Gwenith Daily. Tyrone Sage, M.D.   FIRST ASSISTANT:  Salvatore Decent. Cornelius Moras, M.D.   SECOND ASSISTANT:  Coral Ceo, P.A.   ANESTHESIA:  General.   INDICATIONS FOR PROCEDURE:  The patient is a 60 year old male who in 2002  had undergone angioplasty of the right coronary artery with stent placement,  continued on Plavix, had been relatively asymptomatic.  Recent stress test  was markedly positive which led to cardiac catheterization. This showed  evidence of critical anatomy with greater than 80% stenosis of the left main  coronary artery, 90% stenosis of the proximal LAD, 80% proximal circumflex  stenosis and diffuse disease throughout the stent and right coronary artery  including disease in the proximal posterior descending.  Because of the  patient's critical anatomy with in-stent stenosis, he was maintained in the  hospital while Plavix washout was carried out because of the critical  anatomy and risks of acute thrombosis of the right coronary stent.  Coronary  artery bypass grafting was recommended to the patient  because of his left  main disease and three-vessel coronary artery disease.  The patient agreed  and signed informed consent.  He was also noted to have new diagnosis of  severe diabetes.  Admission glucoses were over 300.  His hemoglobin A1c was  12, so medical consultation was obtained to preoperatively gain good control  of his diabetes in the acute setting.  The patient agreed and signed  informed consent for bypass surgery.   DESCRIPTION OF PROCEDURE:  With Swann-Ganz and arterial line monitors in  place, the patient underwent general endotracheal anesthesia without  incident.  The skin of the chest and legs was prepped with Betadine and  draped in the usual sterile manner.  Using the [employer] Endo vein harvesting  system,  vein was harvested from the right thigh down to the right leg.  The  vein was of good quality and caliber.  A median sternotomy was performed.  The left internal mammary artery was dissected down as a pedicle graft.  The  distal artery was divided and had good free flow.  The pericardium was  opened.  Overall ventricular function appeared preserved.  The patient was  systemically heparinized.  The ascending aorta and the right atrium were  cannulated in the aortic root.  A bent cardioplegia needle was introduced  into the ascending aorta.  The patient was placed on cardiopulmonary bypass  at 2.4L per minute per meter squared.  Sites for anastomosis were selected  and dissected out of the epicardium.  The patient's body temperature was  cooled to 30 degrees.  An aortic crossclamp was applied.  500 mL of cold  blood potassium cardioplegia was administered with rapid diastolic arrest of  the heart.  Myocardial septal temperature was monitored throughout the  crossclamp period.   Attention was turned first to the first obtuse marginal coronary artery  which was a moderate-sized vessel partially intramyocardial.  The vessel was  opened, admitted a 1.5 mm probe.  Using  a running 7-0 Prolene, a distal  anastomosis was performed.  Attention was then turned to the diagonal  coronary artery which was opened and admitted a 1.5 probe.  Using a running  7-0 Prolene distal anastomosis was performed with a segment of reversed  saphenous vein graft.  Attention was then turned to the posterior descending  and posterolateral branch of the right coronary artery.  Using a diamond  type side-to-side anastomosis, the proximal posterior descending coronary  artery was opened and anastomosis carried out.  Distal extent of the same  vein was then carried onto the posterolateral branch of the right coronary  artery and a distal anastomosis was performed with a 7-0 Prolene.  Attention  was then turned to the left anterior descending coronary artery which was  opened between the mid and distal third.  Using running 8-0 Prolene, the  left internal mammary artery was anastomosis to the left anterior descending  coronary artery.  With release of the Edwards bulldog on the mammary artery,  there was appropriate rise in myocardial septal temperature.  Bulldog was  placed back on the mammary artery.  Additional cold blood cardioplegia was  administered.  With the aortic cross-clamp still in place, three punch  aortotomies were performed in the ascending aorta and each of the three vein  grafts were anastomosed to the ascending aorta.  Air was evacuated from the  grafts in the ascending aorta as the aortic cross-clamp was removed with a  total cross-clamp time of 99 minutes.  The patient spontaneously converted  to a sinus rhythm.  Body temperature was rewarmed to 37 degrees.  Atrial and  ventricular pacing wires were applied.  Sites of anastomosis were inspected  and free of bleeding.  The patient was then ventilated and weaned from  cardiopulmonary bypass without difficulty.  He remained hemodynamically stable.  He was decannulated in the usual fashion.  Protamine sulfate was   administered.  With the operative field hemostatic, two atrial and two  ventricular pacing wires were applied.  Graft markers were applied.  A left  pleural tube and two mediastinal tubes were left in place.  Sternum was  closed with #6 stainless steel wire.  Fascia closed with interrupted 0  Vicryl, running 3-0 Vicryl in the subcutaneous tissues and 4-0 subcuticular  stitch in the skin edges.  Dry dressings were applied.  Sponge and needle  counts were reported as correct at the completion of the procedure.  The  patient tolerated the procedure without obvious complication and was  transferred to the surgical intensive care unit for further postoperative  care.      Sheliah Plane, MD  Electronically Signed     EG/MEDQ  D:  10/02/2005  T:  10/02/2005  Job:  [id number]   cc:   Veverly Fells. Excell Seltzer, MD

## 2010-05-25 NOTE — Cardiovascular Report (Signed)
NAME:  [name], Martin                           ACCOUNT NO.:  [id number]   MEDICAL RECORD NO.:  [id number]                   PATIENT TYPE:  OIB   LOCATION:  6501                                 FACILITY:  [facility]   PHYSICIAN:  Charlton Haws, M.D.                  DATE OF BIRTH:  January 12, 1950   DATE OF PROCEDURE:  04/29/2003  DATE OF DISCHARGE:                              CARDIAC CATHETERIZATION   PROCEDURE:  Coronary arteriography.   INDICATIONS:  [name] is a 60 year old patient with recurrent chest pain.  He had a catheterization in 1998, which showed 30-40% left main disease and  50% ostial circumflex disease.  He had a  Cardiolite study with inferior  wall ischemia.   The study was done to assess progression of coronary disease.   Cine catheterization was done with 4 French catheters from the right femoral  artery.   1. Left main coronary artery had a 50% distal tapering stenosis.  2. Left anterior descending artery had 30-40% multiple discrete lesions in     proximal and midportion.  First diagonal branch had 30% multiple discrete     lesions.  3. The ostial circumflex was somewhat difficult to visualize.  The     circumflex system in general was small.  There did not appear to be a     critical stenosis but in the range of a 50% ostial stenosis.  4. The right coronary artery was larger and dominant.  There was a very     large area of ulcerated plaque in the proximal portion with two areas of     70-80% stenosis.  The distal vessel had 30% multiple discrete lesions.  5. RAO ventriculography:  RAO ventriculography showed minimal inferobasal     wall hypokinesis.  The EF was 55%.  There was no gradient across the     aortic valve and no MR.  Aortic pressure was in the 130/66 range, 130/87     range.  LV pressure was in the 133/15 range.   IMPRESSION:  [name] has a significant ulcerative plaque with stenosis in  the right coronary artery.  He will be loaded with Plavix and sent  home on  Plavix over the weekend.  We will have him come back on Tuesday probably for  Dr. Gerri Spore to stent the proximal right coronary artery.   He has not had unstable angina and did not have pain during the study.  I  think, particularly on Plavix, it would be reasonable for him to come back  on Tuesday for an intervention.   We will then have to follow up on his risk factors.   I suspect we will have to reinstitute a statin drug.   His blood pressure seems to be under good control.  Charlton Haws, M.D.    PN/MEDQ  D:  04/29/2003  T:  04/30/2003  Job:  [id number]

## 2010-05-25 NOTE — Discharge Summary (Signed)
NAMEFELIPE, PALUCH                 ACCOUNT NO.:  [id number]   MEDICAL RECORD NO.:  [id number]          PATIENT TYPE:  INP   LOCATION:  2015                         FACILITY:  [facility]   PHYSICIAN:  Sheliah Plane, MD    DATE OF BIRTH:  28-Oct-1950   DATE OF ADMISSION:  09/25/2005  DATE OF DISCHARGE:                                 DISCHARGE SUMMARY   PRIMARY DIAGNOSIS:  Coronary occlusive disease.   IN-HOSPITAL DIAGNOSES.:  1. Newly-diagnosed diabetes mellitus type 2.  2. Postoperative volume overload.   SECONDARY DIAGNOSIS:  History of coronary artery disease, status post  angioplasty of the right coronary artery with stent placement and started on  Plavix in 2002.   IN-HOSPITAL OPERATIONS AND PROCEDURES:  1. Cardiac catheterization.  2. Coronary artery bypass grafting x5 using a left internal mammary artery      to left anterior descending coronary artery, sequential saphenous vein      graft to posterior descending and posterior lateral branches of the      right coronary artery, reversed saphenous vein graft to obtuse      marginal, reversed saphenous vein graft to diagonal, right leg endovein      harvesting done.   HISTORY AND PHYSICAL AND HOSPITAL COURSE:  The patient is a 60 year old male  who dating back to 2002 had undergone angioplasty of the right coronary  artery with stent placement, continued on Plavix and had been relatively  asymptomatic.  A recent stress test was markedly positive with selective  cardiac catheterization.  This showed evidence of critical anatomy with  greater than 80% stenosis of left main coronary artery, 90% stenosis of the  proximal LAD, 80% proximal circumflex stenosis and diffuse disease  throughout the stent of the right coronary artery including disease in the  proximal posterior descending artery.  Because of the patient's critical  anatomy with in-stent stenosis, he was maintained in the hospital, the  Plavix was washed out.  Following  catheterization, Dr. Tyrone Sage was  consulted.  Dr. Tyrone Sage saw and evaluated the patient.  He discussed with  the patient undergoing coronary artery bypass grafting.  He discussed risks  and benefits with the patient.  The patient acknowledged understanding and  agreed to proceed.  Surgery was planned for September 30, 2005.  This would  be 5 days following catheterization and 5 days to wash to Plavix out.  Prior  to undergoing surgery the patient remained stable.  He did have bilateral  carotid duplex ultrasound done showing no significant ICA stenosis.  He also  had bilateral ABIs done showing bilateral greater than 1.0.  During the  patient's preoperative course he was found to have a hemoglobin A1c of  greater than 12.  The patient was diagnosed with diabetes mellitus type 2.  Internal medicine was consulted and started the patient on low-dose Lantus.  They placed the patient on sliding scale insulin  and followed.  For details  of the patient's past medical history and physical exam, please see dictated  history and physical.   The patient was taken to the operating  room on September 24, 200, where he  underwent coronary artery bypass grafting x5 using a left internal mammary  artery to the left anterior descending coronary artery, sequential reversed  saphenous vein graft to posterior descending and posterior lateral branches  of the right coronary artery, reversed saphenous vein graft to the obtuse  marginal, reversed saphenous vein to diagonal, with right leg endovein  harvesting.  The patient tolerated this procedure was transferred up to the  intensive care unit in stable condition.  Immediately following surgery the  patient was seen to be hemodynamically stable.  The patient was extubated  late evening-early morning following surgery.  Following extubation he was  seen to be alert oriented 3, neuro intact.  The patient's postoperative  course was pretty much unremarkable.   Chest tubes and lines were  discontinued postop day #1.  The patient was out of bed, up in chair postop  day #1.  His blood sugars were tightly monitored.  Internal medicine  continued to follow the patient and increased Lantus insulin as needed.  The  patient was transferred out to 2000 postop day #2.  Vital signs were  monitored and seen to be stable.  He was afebrile during his postoperative  course.  The patient remained hemodynamically stable with hemoglobin and  hematocrit at 11.3 and 32.3 postop day #3.  White blood cell count was 12.5  and creatinine was 0.9.  The patient remained in normal sinus rhythm  postoperatively.  His lungs were clear to auscultation bilaterally.  Incisions were clean, dry and intact and healing well.  The patient did have  some volume overload postoperatively and was started on diuretics.  He was  still about 10 pounds above his preoperative weight prior to discharge home.  Nutrition was consulted postoperatively for recommendations for the patient  for newly-diagnosed diabetes mellitus.  The patient was out of bed,  ambulating well postoperatively.  He was tolerating a regular diet well and  no nausea or vomiting were noted.   The patient is tentatively ready for discharge home postop day #4, October 04, 2005.   FOLLOW-UP APPOINTMENTS:  Follow-up appointment will be arranged with Dr.  Tyrone Sage for in 3 weeks.  The patient will need to contact Dr. Earmon Phoenix  office to schedule a follow-up appointment him in 2 weeks.  The patient need  to follow up with his primary care physician for management of diabetes  mellitus.   ACTIVITY:  Mr. Maland received instructions on diet, activity level and  incisional care.  He was told no driving until released to do so, no lifting  over 10 pounds.  The patient is told to ambulate 3-4 times per day, progress  as tolerated, and continue his breathing exercises.  INCISIONAL CARE:  The patient was told to shower, washing  his incisions  using soap and water.  He is to contact the office if he develops any  drainage or opening from any of his incision sites.   DIET:  Patient is instructed on diet to low-fat, low-salt.   DISCHARGE MEDICATIONS:  1. Toprol XL 25 mg daily.  2. Alphagan one drops both eyes b.i.d.  3. Tricor 145 mg at night.  4. Aspirin 325 mg daily.  5. Lasix 40 mg daily x5 days.  6. Potassium chloride 20 mEq daily x5 days.  7. Lantus 40 units subcutaneously at night.  8. Oxycodone 5 mg one to two tablets q.4-6h. p.r.n. pain.     Theda Belfast, PA  Sheliah Plane, MD  Electronically Signed   KMD/MEDQ  D:  10/03/2005  T:  10/05/2005  Job:  [id number]   cc:   Veverly Fells. Excell Seltzer, MD

## 2010-05-25 NOTE — Cardiovascular Report (Signed)
NAMESHOUA, RESSLER                 ACCOUNT NO.:  [id number]   MEDICAL RECORD NO.:  [id number]          PATIENT TYPE:  OIB   LOCATION:  2807                         FACILITY:  [facility]   PHYSICIAN:  Veverly Fells. Excell Seltzer, MD  DATE OF BIRTH:  12/12/1950   DATE OF PROCEDURE:  09/25/2005  DATE OF DISCHARGE:                              CARDIAC CATHETERIZATION   PROCEDURES PERFORMED:  1. Left heart catheterization.  2. Selective coronary angiogram.  3. Angio-Seal closure of the right femoral artery.   CARDIOLOGIST:  Veverly Fells. Excell Seltzer, M.D.   INDICATIONS:  Mr. Frericks is a very pleasant 60 year old male with known  coronary artery disease.  He is status post percutaneous coronary  intervention with stenting of his right coronary artery in 2005.  He  recently underwent a stress test that demonstrated a high risk result as he  had marked EKG changes with exercise as well as a drop in his systemic blood  pressure with exercise.  His Myoview study demonstrated an inferior wall  infarction and no other abnormalities.  Because of the marked hemodynamic  change with exercise in the setting of ischemic EKG changes he was referred  for cardiac catheterization.   ACCESS:  Right femoral artery.   COMPLICATIONS:  None.   DESCRIPTION OF THE PROCEDURE:  The risks and indications of the procedure  were reviewed in detail with the patient and informed consent was obtained.  The right groin was prepped, draped and anesthetized with 1% lidocaine under  normal sterile condition.  Using the modified Seldinger technique a 6 French  arterial sheath was placed in the right common femoral artery.  Multiple  angiographic views were taken of both the left and right coronary arteries.  For the left coronary artery a 6 French JL-4 diagnostic catheter was used.  For the right coronary artery a 6 [place] JR-4 diagnostic catheter was used.  The JR-4 was used to cross the aortic valve and measure left ventricular  pressures.  A pullback was performed across the aortic valve with this  catheter.   A left ventriculogram was not performed because the patient had a recent  evaluation of his left ventricular function by a stress nuclear study.  He  also received a contrast load earlier today with a CT scan of his chest to  evaluate a pulmonary abnormality.   At the conclusion of the procedure a 6 [place] Angio-Seal was used for  hemostasis of the right common femoral artery.   FINDINGS:   HEMODYNAMIC DATA:  Aortic pressure 121/80 with a mean of 99.  Left  ventricular pressure 120/13 with an end-diastolic pressure of 21. There was  no aortic stenosis.   ANGIOGRAPHIC DATA:  Coronary Angiography:  Left Main Coronary Artery:  The left main coronary artery is abnormal.  There is a tubular 50% lesion in the mid body of the left mainstem.  This  area could easily be underestimated angiographically.  The left mainstem  bifurcates into the LAD and left circumflex.   Left Anterior Descending:  The LAD is a large diameter vessel that courses  down  to the left ventricular apex and it gives off a large first diagonal  branch.  Just proximal to the diagonal bifurcation there is a high grade 80%  stenosis with an irregular border.  The stenosis is approximately 80%.  There is also an ostial 50% first diagonal lesion present.  The remainder of  the LAD is angiographically normal.   Left Circumflex:  The left circumflex is a large diameter vessel.  It gives  off a large obtuse marginal that supplies multiple branches to the  inferolateral wall.  The true left circumflex courses down the AV groove.  There is nonobstructive disease throughout the left circumflex system.   Right Coronary Artery:  The right coronary artery is dominant.  Proximally  there is 50% stenosis.  There is a stent that courses from the proximal  vessel into the mid RCA.  There is severe restenosis in the midportion of  the stent of  approximately 90%. The remainder of the RCA shows  nonobstructive plaque.  It gives off a PDA distally.  There is a large RV  marginal branch present as well.   As noted above there was no left ventriculogram performed due to recent  evaluation of the left ventricular function.   ASSESSMENT:  1. High grade restenosis of the right coronary artery with a focal 90%      stenosis in the midportion of the stent.  2. High grade left anterior descending stenosis at the first diagonal      bifurcation with an 80% lesion.  3. Moderate left main disease.  4. Elevated left ventricular filling pressures.   I reviewed the findings with the patient's primary cardiologist, Dr. Eden Emms.  In the setting of his moderate left main disease with a complex lesion in  the mid LAD at the site of a disease first diagonal vessel as well as in-  stent restenosis of the right coronary artery we are recommending a cardiac  surgery consult for coronary bypass.  The patient will be admitted t the  hospital for observation due to the high risk angiographic findings.      Veverly Fells. Excell Seltzer, MD  Electronically Signed     MDC/MEDQ  D:  09/25/2005  T:  09/27/2005  Job:  [id number]

## 2010-05-25 NOTE — Assessment & Plan Note (Signed)
[facility]                            CARDIOLOGY OFFICE NOTE   NAME:[name], Fountain                        MRN:          [id number]  DATE:12/26/2005                            DOB:          21-Jun-1950    Gene returns today for followup.   He is status post coronary artery bypass surgery in September.   He did have some postop volume overload and paroxysmal atrial  fibrillation.  He had an internal mammary artery to the LAD, sequential  graft to the PDA, PLA, a graft to the RCA and a reverse graft to the  diagonal branch.   The patient actually did not have an abnormal Myoview, but had abnormal  hemodynamic response.   Since his bypass, he has been doing fairly well.  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  His blood pressure has been  running high at cardiac rehab.  He questions whether it is related to  the Coreg that we started.  I told him that it was probably not,  however, given the fact that he is a diabetic and he has had weight gain  with increased blood pressure and lower extremity edema, I told him I  would stop his Coreg and start him on Hyzaar 50/12.5.   The patient does have sleep apnea and uses a CPAP machine.  I explained  to him the relationship between sleep apnea and hypertension.  He has  been compliant with his mask, but certainly any weight gain would  exacerbate this.   He has been following up with his diabetes doctor.  He is not on Actos  or Avandia, and has Lantus and Metformin as his primary forms of  diabetic treatment.   REVIEW OF SYSTEMS:  Remarkable for no significant chest pain.  He has  not had any PND or orthopnea.  He does have some lower extremity edema.  He has been watching his diet in terms of his carbohydrate loads and  salt.   MEDICATIONS:  1. An aspirin a day.  2. CPAP machine.  3. Metformin 500 b.i.d.  4. Lantus as directed.  5. Tricor 145 a day.  6. Coreg 12.5 b.i.d.   On exam, the blood pressure is 150/88.  HEENT:  Normal.  Sternum is well-healed.  Lungs are clear.  There is an S1, S2, normal heart sounds.  There is no rub.  ABDOMEN:  Benign.  Lower extremities have +1 edema bilaterally with good pulses.  NEURO:  Nonfocal.  SKIN:  Warm and dry.   IMPRESSION:  I spent 15 minutes filling out the patient's disability  forms.  His short term disability has expired and he needs to have his  papers filled out for long term disability for the next 6 weeks.  I told  him I would like to have him continue cardiac rehab so they can monitor  his exercise effort and blood pressure.  We will stop his Coreg.  He  will taper it to once a day for the next 3 days, and then start his  Hyzaar at 50/12.5.  He understands that he needs to see me back in 3  weeks to reassess his blood pressure and edema, and to check a BMET.  At  that time, we will also check the hemoglobin A1c and see how his  diabetes control is.  I talked to him at length about making sure that  he does not get started on Actos or Avandia given his volume issues,  both postoperatively and now.  The patient had paroxysmal atrial  fibrillation postop.  He is taking an aspirin a day.  Apparently he was  on amiodarone 200 t.i.d. per Dr. Tyrone Sage and I told him to stop this as  well, given the long term sequela of amiodarone.   At some point in time we may have to have his sleep apnea reassessed and  to make sure his machine fits adequately.  After I see him, we may also  arrange nighttime oximetry on CPAP to make sure it is effective in  regards to controlling his blood pressure.   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     Noralyn Pick. Eden Emms, MD, [facility]  Electronically Signed    PCN/MedQ  DD: 12/26/2005  DT: 12/26/2005  Job #: [id number]

## 2010-05-25 NOTE — Discharge Summary (Signed)
NAME:  [name], [name]                           ACCOUNT NO.:  [id number]   MEDICAL RECORD NO.:  [id number]                   PATIENT TYPE:  OIB   LOCATION:  6522                                 FACILITY:  [facility]   PHYSICIAN:  Charlton Haws, M.D.                  DATE OF BIRTH:  January 13, 1950   DATE OF ADMISSION:  05/03/2003  DATE OF DISCHARGE:  05/04/2003                                 DISCHARGE SUMMARY   DISCHARGE DIAGNOSES:  1. Recurrent chest pain with abnormal Cardiolite study, April 14, 2003.  The     study shows mild reversible defect, inferior wall.  2. Admitted May 03, 2003 for elective percutaneous coronary     intervention/stent x2 to the proximal and mid right coronary artery.   SECONDARY DIAGNOSES:  1. Known coronary artery disease, status post cardiac catheterization, April 29, 2003, ejection fraction 55%.  Right coronary artery with ulcerative plaque proximally, 70% to 80%.  He has  residual disease of 50% at the ostial circumflex.  The left main has a 50%  distal tapering stenosis.  The left anterior descending has 30% to 40%  multiple lesions in the mid and proximal vessel.  The first diagonal has 30%  multiple discrete lesions.  1. Chronic obstructive pulmonary disease.  2. Obstructive sleep apnea/continuous positive airway pressure.  3. Hypertension.  4. Morbid obesity.  5. Continued tobacco habituation.  6. Family history of coronary artery disease.  7. Glucose intolerance.   PROCEDURE:  May 03, 2003, drug-eluting stents placed in the proximal and  mid right coronary artery at ulcerative plaque, reducing an 80% stenosis to  0%.  The patient was maintained on intravenous heparin and intravenous  Integrilin during and after the procedure.   DISCHARGE DISPOSITION:  [name] is ready for discharge, post-  procedure day #1.  He has not had chest pain throughout this  hospitalization.  He has had no respiratory compromise.  His blood pressure  is  well-controlled.  His heart rate is within normal limits at 65.  He has  had no cardiac dysrhythmias.  The right groin where the catheterization was  done shows no evidence of swelling, ecchymoses, discharge or bruit.  He has  good perfusion to the right lower extremity.   DISCHARGE MEDICATIONS:  [name] goes home on the following medications:  1. Enteric-coated aspirin 325 mg daily.  2. Plavix 75 mg daily x1 year.  3. Statin to be added at office visit with Dr. Charlton Haws.  4. Nitroglycerin 0.5 mg 1 tab under the tongue every 5 minutes x3 as needed     for chest pain.  5. For pain at the right groin, Tylenol 325 mg 1-2 tabs every 4-6 hours as     needed.   ACTIVITY:  He is asked not to engage in any heavy lifting or straining for  the next  2 weeks.  He may return to work Monday, May 23, 2003.  He is able  to drive beginning Saturday, May 07, 2003.   DISCHARGE DIET:  Low-sodium, low-cholesterol diet.   WOUND CARE:  [name] may shower.  He is to call [phone] if he experiences  tenderness or swelling at the catheterization site in the right groin.   FOLLOWUP:  He has an office visit with Dr. Eden Emms of [facility] on  [facility], Monday, May 30, 2003, at 10:45 in the morning and he is asked  to follow up with Dr. Alphonsus Sias regarding glucose intolerance.   BRIEF HISTORY:  [name] is a 60 year old male.  He has recurrent chest pain  He had a catheterization in 1998 which showed 30% to 40% left main disease  and 50% ostial circumflex.  He recently had a Cardiolite study which was  abnormal, showing inferior wall ischemia.  He presented for cardiac  catheterization, April 29, 2003.  This study showed significant ulcerative  plaque with stenosis in the right coronary artery.  At that time, he was  given Plavix, sent home with Plavix and rescheduled to come back Tuesday,  May 03, 2003, for PCI to the proximal right coronary artery.   HOSPITAL COURSE:  [name] presented  for elective PCI, May 03, 2003.  He  did not have chest pain in the interim.  From his first catheterization to  this procedure, 2 stents were placed in the proximal and the mid right  coronary artery.  There was apparently a spontaneous dissection of  ulcerative plaque and the 80% stenosis reduced to 0% with TIMI-3 flow.  The  patient was maintained on IV Integrilin for 18 hours post procedure and then  Plavix 6-12 months, with aspirin indefinitely.  He will follow up with Dr.  Eden Emms at [facility], Monday, May 30, 2003, at 10:45 in the morning.   DISCHARGE PROCESS:  When combined with visit by cardiologist today, May 04, 2003, and discharge summary, the visit was greater than 30 minutes.      Maple Mirza, P.A.                    Charlton Haws, M.D.    GM/MEDQ  D:  05/04/2003  T:  05/04/2003  Job:  [id number]   cc:   Charlton Haws, M.D.   Dr. Alphonsus Sias

## 2010-06-12 ENCOUNTER — Encounter (HOSPITAL_BASED_OUTPATIENT_CLINIC_OR_DEPARTMENT_OTHER): Payer: PRIVATE HEALTH INSURANCE

## 2010-06-20 ENCOUNTER — Ambulatory Visit (INDEPENDENT_AMBULATORY_CARE_PROVIDER_SITE_OTHER): Payer: PRIVATE HEALTH INSURANCE | Admitting: Internal Medicine

## 2010-06-20 ENCOUNTER — Encounter: Payer: Self-pay | Admitting: Internal Medicine

## 2010-06-20 DIAGNOSIS — M1990 Unspecified osteoarthritis, unspecified site: Secondary | ICD-10-CM

## 2010-06-20 DIAGNOSIS — E1149 Type 2 diabetes mellitus with other diabetic neurological complication: Secondary | ICD-10-CM

## 2010-06-20 DIAGNOSIS — E785 Hyperlipidemia, unspecified: Secondary | ICD-10-CM

## 2010-06-20 DIAGNOSIS — I2510 Atherosclerotic heart disease of native coronary artery without angina pectoris: Secondary | ICD-10-CM

## 2010-06-20 DIAGNOSIS — N529 Male erectile dysfunction, unspecified: Secondary | ICD-10-CM

## 2010-06-20 LAB — BASIC METABOLIC PANEL
BUN: 18 mg/dL (ref 6–23)
Chloride: 105 mEq/L (ref 96–112)
Creatinine, Ser: 0.9 mg/dL (ref 0.4–1.5)
GFR: 88.58 mL/min (ref >60.00)
Potassium: 4.2 mEq/L (ref 3.5–5.1)

## 2010-06-20 MED ORDER — GLUCOSE BLOOD VI STRP: ORAL_STRIP | Status: DC

## 2010-06-20 MED ORDER — TRIAMCINOLONE ACETONIDE 0.025 % EX CREA: 1.0000 "application " | TOPICAL_CREAM | Freq: Three times a day (TID) | CUTANEOUS | Status: DC | PRN

## 2010-06-20 NOTE — Assessment & Plan Note (Signed)
Using OTC pump with band and it is helping

## 2010-06-20 NOTE — Assessment & Plan Note (Signed)
Intermittent pain Uses aleve occ Will check renal Lab Results  Component Value Date   CREATININE 0.9 12/15/2009

## 2010-06-20 NOTE — Assessment & Plan Note (Signed)
Lab Results  Component Value Date   HGBA1C 7.9* 12/15/2009   He has better control since decreasing carbs Discussed exercise and weight loss Will recheck

## 2010-06-20 NOTE — Assessment & Plan Note (Signed)
No symptoms now No changes needed On ASA, statin, ARB and beta blocker

## 2010-06-20 NOTE — Assessment & Plan Note (Signed)
Lab Results  Component Value Date   LDLCALC See Comment mg/dL 16/10/9602   Was 90 last time Recheck next time

## 2010-06-20 NOTE — Progress Notes (Signed)
Subjective:    Patient ID: [name], male    DOB: 08-26-1950, 60 y.o.   MRN: [id number]  HPI Toe ulcer has healed up Done with wound center Feet seem to be fine ABI looked okay though seems to have small vessel disease  Really decreased carbs when ulcer was healing Has had less edema since then Sugars are lower-- 150-160 fasting Had been walking---waiting for new shoes now No hypoglycemic reactions  No chest pain No SOB No palpitations  Just had urology appt PSA undetectable Voiding okay  Ongoing arthritis pain occ takes aleve Both knees, 1 hip and shoulder  Current Outpatient Prescriptions on File Prior to Visit  Medication Sig Dispense Refill  . aspirin 325 MG tablet Take 325 mg by mouth daily.        . brimonidine (ALPHAGAN) 0.15 % ophthalmic solution Place 1 drop into both eyes daily.        . carvedilol (COREG) 25 MG tablet Take 25 mg by mouth 2 (two) times daily with a meal.        . glipiZIDE (GLUCOTROL) 5 MG tablet Take 5 mg by mouth daily.        . insulin glargine (LANTUS) 100 UNIT/ML injection Inject 70 Units into the skin at bedtime.  10 mL  3  . losartan-hydrochlorothiazide (HYZAAR) 50-12.5 MG per tablet Take 1 tablet by mouth daily.        . metFORMIN (GLUCOPHAGE) 1000 MG tablet Take 1,000 mg by mouth 2 (two) times daily with a meal.        . Multiple Vitamin (MULTIVITAMIN) tablet Take 1 tablet by mouth daily.        Marland Kitchen DISCONTD: glucose blood test strip 1 each by Other route as directed. Once weekly (Onetouch Ultra)       . DISCONTD: triamcinolone (KENALOG) 0.025 % cream Apply 1 application topically 3 (three) times daily as needed.         Past Medical History  Diagnosis Date  . Malignant neoplasm of prostate   . Coronary atherosclerosis of unspecified type of vessel, native or graft   . Type II or unspecified type diabetes mellitus with neurological manifestations, not stated as uncontrolled   . Impotence of organic origin   . Unspecified glaucoma     . Internal hemorrhoids without mention of complication   . Other and unspecified hyperlipidemia   . Mononeuritis of unspecified site   . Osteoarthrosis, unspecified whether generalized or localized, unspecified site   . Personal history of colonic polyps   . Routine general medical examination at a health care facility   . Hemorrhage of rectum and anus   . Unspecified sleep apnea   . Unspecified venous (peripheral) insufficiency   . Personal history of gallstones   . Personal history of diabetic foot ulcer     saw wound center, resolved 05/08/2010    Past Surgical History  Procedure Date  . Cardiac catheterization 1998    Negative  . Thrombosed vein 1993    Right leg  . Kidney stone surgery 04/1993  . Retinal detachment surgery 2002-2003  . Rca stents 04/2003    EF 55%  . Coronary artery bypass graft 09/2005    Post op AFIB  . Insertion prostate radiation seed 2009    RT and seeds for prostate cancer    Family History  Problem Relation Age of Onset  . Lung cancer Father   . Multiple sclerosis Mother   . Heart attack  paternal aunts and uncles    History   Social History  . Marital Status: Widowed    Spouse Name: N/A    Number of Children: 3  . Years of Education: N/A   Occupational History  . Heavy Theatre stage manager    Social History Main Topics  . Smoking status: Former Smoker    Types: Cigars    Quit date: 01/07/2001  . Smokeless tobacco: Not on file  . Alcohol Use: No  . Drug Use: No  . Sexually Active: Not on file   Other Topics Concern  . Not on file   Social History Narrative  . No narrative on file   Review of Systems Took some classes last semester--for his knowledge Weight is stable Sleeps well with CPAP of 7     Objective:   Physical Exam  Constitutional: He appears well-developed and well-nourished. No distress.  Neck: Normal range of motion. Neck supple. No thyromegaly present.  Cardiovascular: Normal rate, regular rhythm, normal  heart sounds and intact distal pulses.  Exam reveals no gallop.   No murmur heard. Pulmonary/Chest: Effort normal and breath sounds normal. No respiratory distress. He has no wheezes. He has no rales.  Musculoskeletal: Normal range of motion. He exhibits no edema and no tenderness.  Lymphadenopathy:    He has no cervical adenopathy.  Neurological:       Decreased sensation in feet  Skin:       Ulcer on base of right great toe is completely granulated          Assessment & Plan:

## 2010-07-09 ENCOUNTER — Other Ambulatory Visit: Payer: Self-pay | Admitting: *Deleted

## 2010-07-09 MED ORDER — GLIPIZIDE 5 MG PO TABS: 5.0000 mg | ORAL_TABLET | Freq: Every day | ORAL | Status: DC

## 2010-07-30 ENCOUNTER — Other Ambulatory Visit: Payer: Self-pay | Admitting: *Deleted

## 2010-07-30 MED ORDER — CARVEDILOL 25 MG PO TABS: 25.0000 mg | ORAL_TABLET | Freq: Two times a day (BID) | ORAL | Status: DC

## 2010-07-30 NOTE — Telephone Encounter (Signed)
rx sent to pharmacy by e-script  

## 2010-08-28 ENCOUNTER — Other Ambulatory Visit: Payer: Self-pay | Admitting: Internal Medicine

## 2010-08-28 ENCOUNTER — Ambulatory Visit (INDEPENDENT_AMBULATORY_CARE_PROVIDER_SITE_OTHER): Payer: PRIVATE HEALTH INSURANCE | Admitting: Family Medicine

## 2010-08-28 ENCOUNTER — Encounter: Payer: Self-pay | Admitting: Family Medicine

## 2010-08-28 VITALS — BP 130/74 | HR 82 | Temp 98.2°F | Wt 294.2 lb

## 2010-08-28 DIAGNOSIS — S91201A Unspecified open wound of right great toe with damage to nail, initial encounter: Secondary | ICD-10-CM

## 2010-08-28 DIAGNOSIS — E11621 Type 2 diabetes mellitus with foot ulcer: Secondary | ICD-10-CM

## 2010-08-28 DIAGNOSIS — S91109A Unspecified open wound of unspecified toe(s) without damage to nail, initial encounter: Secondary | ICD-10-CM

## 2010-08-28 MED ORDER — SULFAMETHOXAZOLE-TRIMETHOPRIM 800-160 MG PO TABS: 1.0000 | ORAL_TABLET | Freq: Two times a day (BID) | ORAL | Status: AC

## 2010-08-28 MED ORDER — CEPHALEXIN 500 MG PO CAPS: 500.0000 mg | ORAL_CAPSULE | Freq: Four times a day (QID) | ORAL | Status: DC

## 2010-08-28 MED ORDER — CEPHALEXIN 500 MG PO CAPS: 500.0000 mg | ORAL_CAPSULE | Freq: Three times a day (TID) | ORAL | Status: AC

## 2010-08-28 MED ORDER — GLIPIZIDE 5 MG PO TABS: 5.0000 mg | ORAL_TABLET | Freq: Every day | ORAL | Status: DC

## 2010-08-28 NOTE — Progress Notes (Signed)
HPI 60yo with h/o DM and CABG s/p vein harvesting from R leg presents with ?toe infection.  Noticed several weeks ago that developed blackened discoloration of right great toe nail. Not painful but does not feel much in his feet.  He obviously had some type of trauma to toe but not sure what it was. Two days ago, toe nail fell off while wife was cutting his nails. Since then, clear drainage. No fevers or chills.  + h/o paresthesias bilat feet.  H/o CVI.  No claudication sxs.  Sugar usually runs around 200, last two days a little higher.  Treated for diabetic foot ulcer on bottom  same toe in 03/2010, required  abx treatment and debridement at wound clinic. Lab Results  Component Value Date   HGBA1C 6.9* 06/20/2010   ABIs showed some small vessel disease.     Patient Active Problem List  Diagnoses  . ADENOCARCINOMA, PROSTATE  . DIABETES MELLITUS, TYPE II, WITH NEUROLOGICAL COMPLICATIONS  . HYPERLIPIDEMIA  . NEUROPATHY  . GLAUCOMA  . CORONARY ARTERY DISEASE  . HEMORRHOIDS-INTERNAL  . VENOUS INSUFFICIENCY  . RECTAL BLEEDING  . ERECTILE DYSFUNCTION, ORGANIC  . OSTEOARTHRITIS  . SLEEP APNEA  . PERSONAL HX COLONIC POLYPS  . Diabetic foot ulcer associated with type 2 diabetes mellitus   Past Medical History  Diagnosis Date  . Malignant neoplasm of prostate   . Coronary atherosclerosis of unspecified type of vessel, native or graft   . Type II or unspecified type diabetes mellitus with neurological manifestations, not stated as uncontrolled   . Impotence of organic origin   . Unspecified glaucoma   . Internal hemorrhoids without mention of complication   . Other and unspecified hyperlipidemia   . Mononeuritis of unspecified site   . Osteoarthrosis, unspecified whether generalized or localized, unspecified site   . Personal history of colonic polyps   . Routine general medical examination at a health care facility   . Hemorrhage of rectum and anus   . Unspecified sleep  apnea   . Unspecified venous (peripheral) insufficiency   . Personal history of gallstones   . Personal history of diabetic foot ulcer     saw wound center, resolved 05/08/2010   Past Surgical History  Procedure Date  . Cardiac catheterization 1998    Negative  . Thrombosed vein 1993    Right leg  . Kidney stone surgery 04/1993  . Retinal detachment surgery 2002-2003  . Rca stents 04/2003    EF 55%  . Coronary artery bypass graft 09/2005    Post op AFIB  . Insertion prostate radiation seed 2009    RT and seeds for prostate cancer   History  Substance Use Topics  . Smoking status: Former Smoker    Types: Cigars    Quit date: 01/07/2001  . Smokeless tobacco: Not on file  . Alcohol Use: No   Family History  Problem Relation Age of Onset  . Lung cancer Father   . Multiple sclerosis Mother   . Heart attack      paternal aunts and uncles   Allergies  Allergen Reactions  . Atorvastatin     REACTION: myalgias  . Ezetimibe   . Simvastatin    Current Outpatient Prescriptions on File Prior to Visit  Medication Sig Dispense Refill  . aspirin 325 MG tablet Take 325 mg by mouth daily.        . brimonidine (ALPHAGAN) 0.15 % ophthalmic solution Place 1 drop into both eyes daily.        Marland Kitchen  carvedilol (COREG) 25 MG tablet Take 1 tablet (25 mg total) by mouth 2 (two) times daily with a meal.  60 tablet  11  . glipiZIDE (GLUCOTROL) 5 MG tablet Take 1 tablet (5 mg total) by mouth daily.  30 tablet  6  . glucose blood (ONE TOUCH ULTRA TEST) test strip Patient tests blood sugar once daily dx:250.60  100 each  1  . insulin glargine (LANTUS) 100 UNIT/ML injection Inject 70 Units into the skin at bedtime.  10 mL  3  . losartan-hydrochlorothiazide (HYZAAR) 50-12.5 MG per tablet Take 1 tablet by mouth daily.        . metFORMIN (GLUCOPHAGE) 1000 MG tablet Take 1,000 mg by mouth 2 (two) times daily with a meal.        . Multiple Vitamin (MULTIVITAMIN) tablet Take 1 tablet by mouth daily.        Marland Kitchen  triamcinolone (KENALOG) 0.025 % cream Apply 1 application topically 3 (three) times daily as needed.  30 g  1   ROS: Per HPI   Objective:   BP 130/74  Pulse 82  Temp(Src) 98.2 F (36.8 C) (Oral)  Wt 294 lb 4 oz (133.471 kg)  Physical Exam  Constitutional: He appears well-developed and well-nourished. No distress.  Cardiovascular: Normal rate, regular rhythm and normal heart sounds.    No murmur heard.      Weak DP bilaterally, unable to palpate PT.  Pulmonary/Chest: Effort normal and breath sounds normal. No respiratory distress. He has no wheezes. He has no rales.  Musculoskeletal:       Feet: Right great toe nail missing, healthy appearing tissue below with mainly clear drainage. No warmth or erythema.  Assessment & Plan:     1 Open wound of right great toe with damage to nail     Concern for infection given poor circulation in a diabetic. Pt does not want to return to wound clinic if possible due to cost. Overall, currently looks ok- minimal signs if infection. Start abx (bactrim and keflex to cover MRSA/strep).  Sending for culture, minimal drainage from wound. Keep open, cover wound at night. Pt to call me in 2 days with progress report. If symptoms worsen or he develops a fever, pt to go to ER immediately. The patient indicates understanding of these issues and agrees with the plan.

## 2010-08-30 ENCOUNTER — Telehealth: Payer: Self-pay | Admitting: *Deleted

## 2010-08-30 NOTE — Telephone Encounter (Signed)
Pt's girlfriend called to report that pt's infected toe looks better- still not good, but better.  Doesn't feel hot now.

## 2010-08-30 NOTE — Telephone Encounter (Signed)
Great.  Thanks for the update.  Keep [place] posted. We are waiting for sensitivities of the culture.

## 2010-08-31 LAB — WOUND CULTURE

## 2010-09-14 NOTE — Telephone Encounter (Signed)
[name], has this been taken care of? 

## 2010-09-18 NOTE — Telephone Encounter (Signed)
Noted! Thank you

## 2010-09-18 NOTE — Telephone Encounter (Signed)
Spoke with rep from [facility] regarding sensitivities.  She stated that when the test reports as negative they don't usually do sensitivities, they could have added the sensitivity but the specimen is no longer available, we would have to repeat C&S.

## 2010-10-08 LAB — CBC
HCT: 35.2 — ABNORMAL LOW
Hemoglobin: 12.2 — ABNORMAL LOW
MCHC: 34.7
Platelets: 128 — ABNORMAL LOW
RDW: 16.2 — ABNORMAL HIGH

## 2010-10-08 LAB — PROTIME-INR
INR: 1.2
Prothrombin Time: 15.5 — ABNORMAL HIGH

## 2010-10-08 LAB — COMPREHENSIVE METABOLIC PANEL
Alkaline Phosphatase: 44
BUN: 19
CO2: 29
GFR calc non Af Amer: >60
Glucose, Bld: 188 — ABNORMAL HIGH
Potassium: 4.5
Total Protein: 6.2

## 2010-10-08 LAB — GLUCOSE, CAPILLARY

## 2010-11-26 NOTE — H&P (Signed)
  NAMEPURVIS, SIDLE                 ACCOUNT NO.:  [id number]  MEDICAL RECORD NO.:  [id number]           PATIENT TYPE:  O  LOCATION:  FOOT                         FACILITY:  [facility]  PHYSICIAN:  Ardath Sax, M.D.     DATE OF BIRTH:  1950/07/23  DATE OF ADMISSION:  04/04/2010 DATE OF DISCHARGE:                             HISTORY & PHYSICAL   Mr. Mobley comes to the clinic with an ulcer on the distal phalanx of his right great toe.  He states that about a month ago he [place] the [place] and developed a blister.  Since that time, he has developed this ulcer about 2 cm in size on the volar aspect of his right great toe.  His family doctor already has him on Bactrim and Keflex.  We took a culture of some of the discharge while we were examining him.  He has a long history of many medical problems including obesity, type 2 diabetes.  He is on many medicines for hypertension.  He has had an open heart surgery.  He runs high sugars all the time, says about 200.  He also has some sleep apnea and venous insufficiency.  He has a history of gallstones.  He is on carvedilol for an irregular heartbeat, Glucotrol. He is also on insulin, hydrochlorothiazide, Hyzaar, Glucophage, and vitamins.  He says he quit smoking about 9 years ago.  He has been taking Bactrim and Keflex for this infected ulcer on the plantar aspect of his right great toe.  When we saw him today, he was afebrile, and he did not complain of any pain, but said he had this ulcer for about a month.  So I thought he was definitely a candidate for a total contact cast and in the meantime we put on a collagen dressing and he will come back with the idea of a total contact cast next week and he will continue the Bactrim and the Kefzol.  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  His blood pressure is 125/78.  His blood sugar was 166, pulse 98, temperature is normal.  So today we put on a Prisma dressing  and we will evaluate him for a total contact cast when he comes back next week.     Ardath Sax, M.D.     PP/MEDQ  D:  04/04/2010  T:  04/05/2010  Job:  [id number]  Electronically Signed by Ardath Sax  on 11/26/2010 02:04:35 PM

## 2010-12-08 ENCOUNTER — Other Ambulatory Visit: Payer: Self-pay | Admitting: Internal Medicine

## 2010-12-25 ENCOUNTER — Encounter: Payer: Self-pay | Admitting: Internal Medicine

## 2010-12-25 ENCOUNTER — Ambulatory Visit (INDEPENDENT_AMBULATORY_CARE_PROVIDER_SITE_OTHER): Payer: PRIVATE HEALTH INSURANCE | Admitting: Internal Medicine

## 2010-12-25 VITALS — BP 142/72 | HR 77 | Temp 97.7°F | Ht 72.0 in | Wt 301.0 lb

## 2010-12-25 DIAGNOSIS — E785 Hyperlipidemia, unspecified: Secondary | ICD-10-CM

## 2010-12-25 DIAGNOSIS — Z0000 Encounter for general adult medical examination without abnormal findings: Secondary | ICD-10-CM | POA: Insufficient documentation

## 2010-12-25 DIAGNOSIS — Z2911 Encounter for prophylactic immunotherapy for respiratory syncytial virus (RSV): Secondary | ICD-10-CM

## 2010-12-25 DIAGNOSIS — I2510 Atherosclerotic heart disease of native coronary artery without angina pectoris: Secondary | ICD-10-CM

## 2010-12-25 DIAGNOSIS — Z23 Encounter for immunization: Secondary | ICD-10-CM

## 2010-12-25 DIAGNOSIS — E1149 Type 2 diabetes mellitus with other diabetic neurological complication: Secondary | ICD-10-CM

## 2010-12-25 LAB — BASIC METABOLIC PANEL
BUN: 20 mg/dL (ref 6–23)
CO2: 27 mEq/L (ref 19–32)
Calcium: 9.7 mg/dL (ref 8.4–10.5)
Chloride: 104 mEq/L (ref 96–112)
Creatinine, Ser: 1.1 mg/dL (ref 0.4–1.5)
Glucose, Bld: 197 mg/dL — ABNORMAL HIGH (ref 70–99)

## 2010-12-25 LAB — HEPATIC FUNCTION PANEL
ALT: 27 U/L (ref 0–53)
Bilirubin, Direct: 0 mg/dL (ref 0.0–0.3)
Total Bilirubin: 0.6 mg/dL (ref 0.3–1.2)

## 2010-12-25 LAB — CBC WITH DIFFERENTIAL/PLATELET
Basophils Absolute: 0.1 10*3/uL (ref 0.0–0.1)
Basophils Relative: 0.7 % (ref 0.0–3.0)
Eosinophils Absolute: 0.3 10*3/uL (ref 0.0–0.7)
Lymphocytes Relative: 14.8 % (ref 12.0–46.0)
MCHC: 35.3 g/dL (ref 30.0–36.0)
MCV: 88.5 fl (ref 78.0–100.0)
Monocytes Absolute: 0.4 10*3/uL (ref 0.1–1.0)
Neutrophils Relative %: 75.5 % (ref 43.0–77.0)
Platelets: 166 10*3/uL (ref 150.0–400.0)
RDW: 15.2 % — ABNORMAL HIGH (ref 11.5–14.6)

## 2010-12-25 LAB — HEMOGLOBIN A1C: Hgb A1c MFr Bld: 8.3 % — ABNORMAL HIGH (ref 4.6–6.5)

## 2010-12-25 LAB — LIPID PANEL
Cholesterol: 151 mg/dL (ref 0–200)
HDL: 32.7 mg/dL — ABNORMAL LOW (ref >39.00)
Total CHOL/HDL Ratio: 5
Triglycerides: 329 mg/dL — ABNORMAL HIGH (ref 0.0–149.0)
VLDL: 65.8 mg/dL — ABNORMAL HIGH (ref 0.0–40.0)

## 2010-12-25 LAB — LDL CHOLESTEROL, DIRECT: Direct LDL: 78.6 mg/dL

## 2010-12-25 LAB — MICROALBUMIN / CREATININE URINE RATIO: Microalb Creat Ratio: 1.4 mg/g (ref 0.0–30.0)

## 2010-12-25 NOTE — Assessment & Plan Note (Signed)
  LDL 90 Will recheck

## 2010-12-25 NOTE — Assessment & Plan Note (Signed)
control not as good May need to increase lantus

## 2010-12-25 NOTE — Assessment & Plan Note (Signed)
Has been quiet Needs to work more on fitness 

## 2010-12-25 NOTE — Assessment & Plan Note (Signed)
No acute problems but gained weight and really must work on fitness UTD on colon Prostate cancer followed by urologist

## 2010-12-25 NOTE — Progress Notes (Signed)
Subjective:    Patient ID: [name], male    DOB: February 06, 1950, 60 y.o.   MRN: [id number]  HPI Doing well Toe has healed up again--the toenail came off but is growing back  Checks sugars variably---at most daily Has running high 160-260 Really goes up with any bread or potatoes--has been travelling and not eating as well ([address] [place], [place])  No heart trouble  Still sees Dr Laverle Patter No recurrence of prostate cancer PSA very low  Current Outpatient Prescriptions on File Prior to Visit  Medication Sig Dispense Refill  . aspirin 325 MG tablet Take 325 mg by mouth daily.        . brimonidine (ALPHAGAN) 0.15 % ophthalmic solution Place 1 drop into both eyes daily.        . carvedilol (COREG) 25 MG tablet Take 1 tablet (25 mg total) by mouth 2 (two) times daily with a meal.  60 tablet  11  . glipiZIDE (GLUCOTROL) 5 MG tablet Take 1 tablet (5 mg total) by mouth daily.  30 tablet  6  . glucose blood (ONE TOUCH ULTRA TEST) test strip Patient tests blood sugar once daily dx:250.60  100 each  1  . LANTUS 100 UNIT/ML injection INJECT 60 UNITS UNDER SKIN AT BEDTIME  20 mL  6  . losartan-hydrochlorothiazide (HYZAAR) 50-12.5 MG per tablet TAKE ONE BY MOUTH DAILY  30 tablet  9  . metFORMIN (GLUCOPHAGE) 1000 MG tablet Take 1,000 mg by mouth 2 (two) times daily with a meal.        . Multiple Vitamin (MULTIVITAMIN) tablet Take 1 tablet by mouth daily.        Marland Kitchen triamcinolone (KENALOG) 0.025 % cream Apply 1 application topically 3 (three) times daily as needed.  30 g  1    Allergies  Allergen Reactions  . Atorvastatin     REACTION: myalgias  . Ezetimibe   . Simvastatin     Past Medical History  Diagnosis Date  . Recurrent prostate adenocarcinoma   . Coronary atherosclerosis of unspecified type of vessel, native or graft   . Type II or unspecified type diabetes mellitus with neurological manifestations, not stated as uncontrolled   . Impotence of organic origin   . Unspecified glaucoma    . Internal hemorrhoids without mention of complication   . Other and unspecified hyperlipidemia   . Mononeuritis of unspecified site   . Osteoarthrosis, unspecified whether generalized or localized, unspecified site   . Personal history of colonic polyps   . Routine general medical examination at a health care facility   . Hemorrhage of rectum and anus   . Unspecified sleep apnea   . Unspecified venous (peripheral) insufficiency   . Personal history of gallstones   . Personal history of diabetic foot ulcer     saw wound center, resolved 05/08/2010    Past Surgical History  Procedure Date  . Cardiac catheterization 1998    Negative  . Thrombosed vein 1993    Right leg  . Kidney stone surgery 04/1993  . Retinal detachment surgery 2002-2003  . Rca stents 04/2003    EF 55%  . Coronary artery bypass graft 09/2005    Post op AFIB  . Insertion prostate radiation seed 2009    RT and seeds for prostate cancer    Family History  Problem Relation Age of Onset  . Lung cancer Father   . Multiple sclerosis Mother   . Heart attack      paternal  aunts and uncles    History   Social History  . Marital Status: Widowed    Spouse Name: N/A    Number of Children: 3  . Years of Education: N/A   Occupational History  . Heavy Theatre stage manager    Social History Main Topics  . Smoking status: Former Smoker    Types: Cigars    Quit date: 01/07/2001  . Smokeless tobacco: Never Used  . Alcohol Use: No  . Drug Use: No  . Sexually Active: Not on file   Other Topics Concern  . Not on file   Social History Narrative  . No narrative on file   Review of Systems  Constitutional: Positive for unexpected weight change. Negative for fatigue.       Wears seat belt Tries to walk regularly  HENT: Positive for hearing loss. Negative for congestion, rhinorrhea and tinnitus.        Chronic hearing loss Regular with dentist  Eyes: Negative for visual disturbance.       No diplopia or  unilateral vision loss Recent eye exam  Respiratory: Negative for cough, chest tightness and shortness of breath.   Cardiovascular: Positive for leg swelling. Negative for chest pain and palpitations.       Occ ankle edema  Gastrointestinal: Positive for anal bleeding. Negative for nausea, vomiting, constipation and blood in stool.       No sig heartburn Occ blood after riding lawn mower--related to hemorrhoids---cream helps  Genitourinary: Negative for urgency, frequency and difficulty urinating.       Rare nocturia Satisfied with vacuum pump for sex  Musculoskeletal: Positive for arthralgias. Negative for back pain and joint swelling.       Scattered joint pains--mostly shoulders occ aleve helps  Skin: Negative for rash.       No foot lesions Notes some ingrown hairs on occiput--may be related to CPAP strap  Neurological: Positive for numbness. Negative for dizziness, syncope, weakness and light-headedness.       Numbness in feet. occ tingling  Hematological: Negative for adenopathy. Does not bruise/bleed easily.  Psychiatric/Behavioral: Negative for sleep disturbance and dysphoric mood. The patient is not nervous/anxious.        CPAP -7---sleeps well with this       Objective:   Physical Exam  Constitutional: He appears well-developed and well-nourished. No distress.  HENT:  Head: Atraumatic.  Right Ear: External ear normal.  Left Ear: External ear normal.  Mouth/Throat: Oropharynx is clear and moist. No oropharyngeal exudate.       TMs normal  Eyes: Conjunctivae and EOM are normal. Pupils are equal, round, and reactive to light.  Neck: Normal range of motion. Neck supple. No thyromegaly present.  Cardiovascular: Normal rate, regular rhythm and normal heart sounds.  Exam reveals no gallop.   No murmur heard.      Faint pedal pulses  Pulmonary/Chest: Effort normal and breath sounds normal. No respiratory distress. He has no wheezes. He has no rales.  Abdominal: Soft. There  is no tenderness.  Musculoskeletal: He exhibits no edema and no tenderness.  Lymphadenopathy:    He has no cervical adenopathy.  Skin: No rash noted. No erythema.       7-63mm benign nevus left lower neck---"lifelong" per patient  Psychiatric: He has a normal mood and affect. His behavior is normal. Judgment and thought content normal.          Assessment & Plan:

## 2010-12-31 ENCOUNTER — Telehealth: Payer: Self-pay | Admitting: *Deleted

## 2010-12-31 NOTE — Telephone Encounter (Signed)
.  left message to have patient return my call.  

## 2010-12-31 NOTE — Telephone Encounter (Signed)
Message copied by Sueanne Margarita on Mon Dec 31, 2010  9:31 AM ------      Message from: Tillman Abide I      Created: Wed Dec 26, 2010  8:55 AM       Please call      The diabetes control is much worse with HgbA1c all the way up to 8.3%. He MUST be careful with eating and avoid all the carbs, even if travelling. He should buckle down on the eating and try to walk or do other physical exercise on a daily basis. Set up repeat HgbA1c in 2-3 months. If not back down again, we will have to increase his meds      Chol is fine and about the same with total of 151 abd LDL or bad chol of 78      Urine and blood tests show no signs of kidney damage      Blood count, liver and thyroid are normal

## 2011-01-02 NOTE — Telephone Encounter (Signed)
.  left message to have patient return my call.  

## 2011-01-03 NOTE — Telephone Encounter (Signed)
Informed patient of his results and he stated he would call back to make his lab appointment

## 2011-01-08 ENCOUNTER — Other Ambulatory Visit: Payer: Self-pay | Admitting: Internal Medicine

## 2011-02-25 ENCOUNTER — Other Ambulatory Visit: Payer: Self-pay | Admitting: *Deleted

## 2011-02-25 MED ORDER — GLIPIZIDE 5 MG PO TABS: 5.0000 mg | ORAL_TABLET | Freq: Every day | ORAL | Status: DC

## 2011-03-30 ENCOUNTER — Other Ambulatory Visit: Payer: Self-pay | Admitting: Internal Medicine

## 2011-07-19 ENCOUNTER — Encounter: Payer: Self-pay | Admitting: Internal Medicine

## 2011-07-19 ENCOUNTER — Ambulatory Visit (INDEPENDENT_AMBULATORY_CARE_PROVIDER_SITE_OTHER): Payer: PRIVATE HEALTH INSURANCE | Admitting: Internal Medicine

## 2011-07-19 VITALS — BP 132/70 | HR 83 | Temp 98.4°F | Ht 72.0 in | Wt 300.0 lb

## 2011-07-19 DIAGNOSIS — E785 Hyperlipidemia, unspecified: Secondary | ICD-10-CM

## 2011-07-19 DIAGNOSIS — I2510 Atherosclerotic heart disease of native coronary artery without angina pectoris: Secondary | ICD-10-CM

## 2011-07-19 DIAGNOSIS — E1149 Type 2 diabetes mellitus with other diabetic neurological complication: Secondary | ICD-10-CM | POA: Insufficient documentation

## 2011-07-19 DIAGNOSIS — E1142 Type 2 diabetes mellitus with diabetic polyneuropathy: Secondary | ICD-10-CM

## 2011-07-19 LAB — HEMOGLOBIN A1C: Hgb A1c MFr Bld: 9.6 % — ABNORMAL HIGH (ref 4.6–6.5)

## 2011-07-19 NOTE — Assessment & Plan Note (Signed)
queit No changes needed

## 2011-07-19 NOTE — Assessment & Plan Note (Signed)
Has done nothing to improve lifestyle Harsh counseling done Must lose weight Will increase lantus to 70 and may need to titrate more

## 2011-07-19 NOTE — Progress Notes (Signed)
Subjective:    Patient ID: [name], male    DOB: 1950/10/06, 61 y.o.   MRN: [id number]  HPI Doing okay Travelling a lot Having trouble with CPAP machine---set at 7cm Dr Shelle Iron originally did study  Not doing great with eating Still running 200 or so fasting No hypoglycemic reactions Due for eye exam  No heart problems No chest pain No SOB Walks "a lot"-- 30-45 minutes and does yard work (his and 2 other houses) Still stays with mom a good amount of the time  Current Outpatient Prescriptions on File Prior to Visit  Medication Sig Dispense Refill  . aspirin 325 MG tablet Take 325 mg by mouth daily.        . brimonidine (ALPHAGAN) 0.15 % ophthalmic solution Place 1 drop into both eyes daily.        . carvedilol (COREG) 25 MG tablet Take 1 tablet (25 mg total) by mouth 2 (two) times daily with a meal.  60 tablet  11  . glipiZIDE (GLUCOTROL) 5 MG tablet Take 1 tablet (5 mg total) by mouth daily.  30 tablet  11  . glucose blood (ONE TOUCH ULTRA TEST) test strip Patient tests blood sugar once daily dx:250.60  100 each  1  . LANTUS 100 UNIT/ML injection INJECT 60 UNITS UNDER SKIN AT BEDTIME  20 mL  6  . losartan-hydrochlorothiazide (HYZAAR) 50-12.5 MG per tablet TAKE ONE BY MOUTH DAILY  30 tablet  9  . metFORMIN (GLUCOPHAGE) 1000 MG tablet TAKE 1 TABLET BY MOUTH TWICE A DAY  60 tablet  10  . Multiple Vitamin (MULTIVITAMIN) tablet Take 1 tablet by mouth daily.        Marland Kitchen triamcinolone (KENALOG) 0.025 % cream Apply 1 application topically 3 (three) times daily as needed.  30 g  1    Allergies  Allergen Reactions  . Atorvastatin     REACTION: myalgias  . Ezetimibe   . Simvastatin     Past Medical History  Diagnosis Date  . Malignant neoplasm of prostate   . Coronary atherosclerosis of unspecified type of vessel, native or graft   . Type II or unspecified type diabetes mellitus with neurological manifestations, not stated as uncontrolled   . Impotence of organic origin   .  Unspecified glaucoma   . Internal hemorrhoids without mention of complication   . Other and unspecified hyperlipidemia   . Mononeuritis of unspecified site   . Osteoarthrosis, unspecified whether generalized or localized, unspecified site   . Personal history of colonic polyps   . Routine general medical examination at a health care facility   . Hemorrhage of rectum and anus   . Unspecified sleep apnea   . Unspecified venous (peripheral) insufficiency   . Personal history of gallstones   . Personal history of diabetic foot ulcer     saw wound center, resolved 05/08/2010    Past Surgical History  Procedure Date  . Cardiac catheterization 1998    Negative  . Thrombosed vein 1993    Right leg  . Kidney stone surgery 04/1993  . Retinal detachment surgery 2002-2003  . Rca stents 04/2003    EF 55%  . Coronary artery bypass graft 09/2005    Post op AFIB  . Insertion prostate radiation seed 2009    RT and seeds for prostate cancer    Family History  Problem Relation Age of Onset  . Lung cancer Father   . Multiple sclerosis Mother   . Heart attack  paternal aunts and uncles    History   Social History  . Marital Status: Widowed    Spouse Name: N/A    Number of Children: 3  . Years of Education: N/A   Occupational History  . Heavy Theatre stage manager    Social History Main Topics  . Smoking status: Former Smoker    Types: Cigars    Quit date: 01/07/2001  . Smokeless tobacco: Never Used  . Alcohol Use: No  . Drug Use: No  . Sexually Active: Not on file   Other Topics Concern  . Not on file   Social History Narrative  . No narrative on file   Review of Systems Sleeps well Weight up a few pounds! Bowels are fine    Objective:   Physical Exam  Constitutional: He appears well-developed and well-nourished. No distress.  Neck: Normal range of motion. Neck supple. No thyromegaly present.  Cardiovascular: Normal rate, regular rhythm and normal heart sounds.  Exam  reveals no gallop.   No murmur heard.      Faint or absent pedal pulses  Pulmonary/Chest: Effort normal and breath sounds normal. No respiratory distress. He has no wheezes. He has no rales.  Musculoskeletal: He exhibits no edema and no tenderness.  Lymphadenopathy:    He has no cervical adenopathy.  Psychiatric: He has a normal mood and affect. His behavior is normal.          Assessment & Plan:

## 2011-07-19 NOTE — Assessment & Plan Note (Signed)
  At goal without meds

## 2011-07-29 ENCOUNTER — Encounter: Payer: Self-pay | Admitting: *Deleted

## 2011-08-21 ENCOUNTER — Other Ambulatory Visit: Payer: Self-pay | Admitting: *Deleted

## 2011-08-21 MED ORDER — CARVEDILOL 25 MG PO TABS: 25.0000 mg | ORAL_TABLET | Freq: Two times a day (BID) | ORAL | Status: DC

## 2011-10-13 ENCOUNTER — Other Ambulatory Visit: Payer: Self-pay | Admitting: Internal Medicine

## 2011-10-15 ENCOUNTER — Other Ambulatory Visit: Payer: Self-pay | Admitting: Internal Medicine

## 2011-12-31 ENCOUNTER — Ambulatory Visit (INDEPENDENT_AMBULATORY_CARE_PROVIDER_SITE_OTHER): Payer: PRIVATE HEALTH INSURANCE | Admitting: Internal Medicine

## 2011-12-31 ENCOUNTER — Encounter: Payer: Self-pay | Admitting: Internal Medicine

## 2011-12-31 VITALS — BP 150/88 | HR 78 | Temp 98.3°F | Ht 72.0 in | Wt 300.0 lb

## 2011-12-31 DIAGNOSIS — I2510 Atherosclerotic heart disease of native coronary artery without angina pectoris: Secondary | ICD-10-CM

## 2011-12-31 DIAGNOSIS — E785 Hyperlipidemia, unspecified: Secondary | ICD-10-CM

## 2011-12-31 DIAGNOSIS — E1149 Type 2 diabetes mellitus with other diabetic neurological complication: Secondary | ICD-10-CM

## 2011-12-31 DIAGNOSIS — Z0000 Encounter for general adult medical examination without abnormal findings: Secondary | ICD-10-CM

## 2011-12-31 LAB — BASIC METABOLIC PANEL
CO2: 26 mEq/L (ref 19–32)
Chloride: 105 mEq/L (ref 96–112)
Glucose, Bld: 124 mg/dL — ABNORMAL HIGH (ref 70–99)
Sodium: 139 mEq/L (ref 135–145)

## 2011-12-31 LAB — CBC WITH DIFFERENTIAL/PLATELET
Basophils Absolute: 0.1 10*3/uL (ref 0.0–0.1)
Basophils Relative: 0.9 % (ref 0.0–3.0)
Eosinophils Absolute: 0.5 10*3/uL (ref 0.0–0.7)
Hemoglobin: 14.3 g/dL (ref 13.0–17.0)
Lymphs Abs: 1.4 10*3/uL (ref 0.7–4.0)
MCHC: 34.4 g/dL (ref 30.0–36.0)
MCV: 87.2 fl (ref 78.0–100.0)
Monocytes Absolute: 0.5 10*3/uL (ref 0.1–1.0)
Neutro Abs: 6.4 10*3/uL (ref 1.4–7.7)
RBC: 4.78 Mil/uL (ref 4.22–5.81)
RDW: 15.5 % — ABNORMAL HIGH (ref 11.5–14.6)

## 2011-12-31 LAB — HM DIABETES FOOT EXAM

## 2011-12-31 NOTE — Assessment & Plan Note (Signed)
UTD on imms and colon Sees urologist for prostate cancer follow up Discussed fitness---he needs to really improve

## 2011-12-31 NOTE — Assessment & Plan Note (Signed)
Has increased his insulin a lot and weight is stable Needs to lose weight Consider januvia See back in 3 months if not under 9%  Needs diabetic shoes for sensory loss, callous and past ulcerations Rx done

## 2011-12-31 NOTE — Assessment & Plan Note (Signed)
No symptoms Home BP usually 132/80 BP Readings from Last 3 Encounters:  12/31/11 150/88  07/19/11 132/70  12/25/10 142/72   No change for now

## 2011-12-31 NOTE — Assessment & Plan Note (Signed)
Okay with med Due for labs 

## 2011-12-31 NOTE — Progress Notes (Signed)
Subjective:    Patient ID: [name], male    DOB: 1950-02-28, 61 y.o.   MRN: [id number]  HPI Here for physical Did increase the lantus all the way to 100 Has been weighing himself---feels he has lost weight (gained after last visit and then lost again) Trying to be more careful with eating Overdue for eye exam  Trouble walking due to hip pain Uses aleve as needed  Ongoing numbness of feet Past ulceration on feet Needs new Rx for diabetic shoes Has nail that is ready to come off now---after kicking soccer ball with grandson  No heart problems  Sees urologist regularly No evidence of recurrence (PSA 0.01 or so)  Current Outpatient Prescriptions on File Prior to Visit  Medication Sig Dispense Refill  . aspirin 325 MG tablet Take 325 mg by mouth daily.        . brimonidine (ALPHAGAN) 0.15 % ophthalmic solution Place 1 drop into both eyes daily.        . carvedilol (COREG) 25 MG tablet Take 1 tablet (25 mg total) by mouth 2 (two) times daily with a meal.  60 tablet  11  . glipiZIDE (GLUCOTROL) 5 MG tablet Take 1 tablet (5 mg total) by mouth daily.  30 tablet  11  . glucose blood (ONE TOUCH ULTRA TEST) test strip Patient tests blood sugar once daily dx:250.60  100 each  1  . insulin glargine (LANTUS) 100 UNIT/ML injection       . losartan-hydrochlorothiazide (HYZAAR) 50-12.5 MG per tablet TAKE ONE BY MOUTH DAILY  30 tablet  11  . metFORMIN (GLUCOPHAGE) 1000 MG tablet TAKE 1 TABLET BY MOUTH TWICE A DAY  60 tablet  10  . Multiple Vitamin (MULTIVITAMIN) tablet Take 1 tablet by mouth daily.        Marland Kitchen triamcinolone (KENALOG) 0.025 % cream Apply 1 application topically 3 (three) times daily as needed.  30 g  1    Allergies  Allergen Reactions  . Atorvastatin     REACTION: myalgias  . Ezetimibe   . Simvastatin     Past Medical History  Diagnosis Date  . Malignant neoplasm of prostate   . Coronary atherosclerosis of unspecified type of vessel, native or graft   . Type II or  unspecified type diabetes mellitus with neurological manifestations, not stated as uncontrolled(250.60)   . Impotence of organic origin   . Unspecified glaucoma(365.9)   . Internal hemorrhoids without mention of complication   . Other and unspecified hyperlipidemia   . Mononeuritis of unspecified site   . Osteoarthrosis, unspecified whether generalized or localized, unspecified site   . Personal history of colonic polyps   . Routine general medical examination at a health care facility   . Hemorrhage of rectum and anus   . Unspecified sleep apnea   . Unspecified venous (peripheral) insufficiency   . Personal history of gallstones   . Personal history of diabetic foot ulcer     saw wound center, resolved 05/08/2010    Past Surgical History  Procedure Date  . Cardiac catheterization 1998    Negative  . Thrombosed vein 1993    Right leg  . Kidney stone surgery 04/1993  . Retinal detachment surgery 2002-2003  . Rca stents 04/2003    EF 55%  . Coronary artery bypass graft 09/2005    Post op AFIB  . Insertion prostate radiation seed 2009    RT and seeds for prostate cancer    Family History  Problem  Relation Age of Onset  . Lung cancer Father   . Multiple sclerosis Mother   . Heart attack      paternal aunts and uncles    History   Social History  . Marital Status: Widowed    Spouse Name: N/A    Number of Children: 3  . Years of Education: N/A   Occupational History  . Heavy Theatre stage manager    Social History Main Topics  . Smoking status: Former Smoker    Types: Cigars    Quit date: 01/07/2001  . Smokeless tobacco: Never Used  . Alcohol Use: No  . Drug Use: No  . Sexually Active: Not on file   Other Topics Concern  . Not on file   Social History Narrative  . No narrative on file   Review of Systems  Constitutional: Negative for fatigue.       Wears seat belt  HENT: Negative for hearing loss, congestion, rhinorrhea, dental problem and tinnitus.         Regular with dentist   Eyes: Negative for photophobia and visual disturbance.       Occ eye watering  Respiratory: Negative for cough, chest tightness and shortness of breath.   Cardiovascular: Positive for leg swelling. Negative for chest pain and palpitations.       Occ ankle edema  Gastrointestinal: Negative for nausea, vomiting, abdominal pain, constipation and blood in stool.       No heartburn  Genitourinary: Negative for urgency, frequency and difficulty urinating.  Musculoskeletal: Positive for back pain and arthralgias. Negative for joint swelling.  Skin: Negative for rash.       No suspicious lesions  Neurological: Positive for weakness and numbness. Negative for dizziness, syncope, light-headedness and headaches.       Intermittent weakness---feels he gets physically tired more easily  Hematological: Negative for adenopathy. Does not bruise/bleed easily.  Psychiatric/Behavioral: Negative for sleep disturbance and dysphoric mood. The patient is not nervous/anxious.        Sleeps well on the CPAP       Objective:   Physical Exam  Constitutional: He is oriented to person, place, and time. He appears well-developed and well-nourished. No distress.  HENT:  Head: Normocephalic and atraumatic.  Right Ear: External ear normal.  Left Ear: External ear normal.  Mouth/Throat: Oropharynx is clear and moist. No oropharyngeal exudate.  Eyes: Conjunctivae normal and EOM are normal. Pupils are equal, round, and reactive to light.  Neck: Normal range of motion. Neck supple. No thyromegaly present.  Cardiovascular: Normal rate, regular rhythm, normal heart sounds and intact distal pulses.  Exam reveals no gallop.   No murmur heard. Pulmonary/Chest: Effort normal and breath sounds normal. No respiratory distress. He has no wheezes. He has no rales.  Abdominal: Soft. There is no tenderness.  Musculoskeletal:       Trace edema in ankles No active synovitis  Lymphadenopathy:    He has no  cervical adenopathy.  Neurological: He is alert and oriented to person, place, and time.       Decreased sensation on feet  Skin:       Subungual hematoma on 1 toe each foot Callous under great toes  Psychiatric: He has a normal mood and affect. His behavior is normal.          Assessment & Plan:

## 2012-01-02 ENCOUNTER — Encounter: Payer: Self-pay | Admitting: *Deleted

## 2012-01-02 LAB — HEPATIC FUNCTION PANEL
ALT: 32 U/L (ref 0–53)
Albumin: 4.3 g/dL (ref 3.5–5.2)
Total Protein: 7.2 g/dL (ref 6.0–8.3)

## 2012-01-02 LAB — LIPID PANEL
Cholesterol: 135 mg/dL (ref 0–200)
Total CHOL/HDL Ratio: 5
Triglycerides: 242 mg/dL — ABNORMAL HIGH (ref 0.0–149.0)

## 2012-01-02 LAB — LDL CHOLESTEROL, DIRECT: Direct LDL: 84.3 mg/dL

## 2012-01-11 ENCOUNTER — Other Ambulatory Visit: Payer: Self-pay | Admitting: Internal Medicine

## 2012-01-27 ENCOUNTER — Encounter: Payer: Self-pay | Admitting: Gastroenterology

## 2012-02-12 ENCOUNTER — Telehealth: Payer: Self-pay

## 2012-02-12 NOTE — Telephone Encounter (Signed)
Pt left note with letter that he cannot afford Lantus; pt request another med substituted for Lantus or review letter pt left(given to [facility]) and send prior auth to insurance co.Please advise.

## 2012-02-13 NOTE — Telephone Encounter (Signed)
Okay to change to levemir--flexpen if he wishes No dose in his chart of the lantus Would change to same dose on Rx Have him start with 10% lower dose to see how he responds and then go up to same dose if no low sugar problems Fill for 1 year

## 2012-02-13 NOTE — Telephone Encounter (Signed)
Letter on your desk to review, looks like covered alternatives are: Levemir Vial or Levemir flexpen. Please advise

## 2012-02-14 NOTE — Telephone Encounter (Signed)
Left message at home and work asking patient to return my call.

## 2012-02-17 NOTE — Telephone Encounter (Signed)
Left message on VM at home number and also called work number and male that answered stated pt was not there and I asked her to have him call our office and she stated he should be back before 5:00.

## 2012-02-18 ENCOUNTER — Other Ambulatory Visit: Payer: Self-pay | Admitting: Internal Medicine

## 2012-02-18 MED ORDER — INSULIN DETEMIR 100 UNIT/ML ~~LOC~~ SOLN: 100.0000 [IU] | Freq: Every day | SUBCUTANEOUS | Status: DC

## 2012-02-18 NOTE — Telephone Encounter (Signed)
Spoke with patient and he wanted to switch to Levemir vial, pt uses 100 units once daily, rx sent to pharmacy, pt understands instructions. He will call if any problems.

## 2012-03-19 ENCOUNTER — Other Ambulatory Visit: Payer: Self-pay | Admitting: *Deleted

## 2012-03-19 MED ORDER — LOSARTAN POTASSIUM-HCTZ 50-12.5 MG PO TABS: ORAL_TABLET | ORAL | Status: DC

## 2012-03-19 MED ORDER — INSULIN DETEMIR 100 UNIT/ML ~~LOC~~ SOLN: 100.0000 [IU] | Freq: Every day | SUBCUTANEOUS | Status: DC

## 2012-03-19 MED ORDER — CARVEDILOL 25 MG PO TABS: 25.0000 mg | ORAL_TABLET | Freq: Two times a day (BID) | ORAL | Status: DC

## 2012-03-19 MED ORDER — GLIPIZIDE 5 MG PO TABS: ORAL_TABLET | ORAL | Status: DC

## 2012-03-19 MED ORDER — METFORMIN HCL 1000 MG PO TABS: ORAL_TABLET | ORAL | Status: DC

## 2012-04-06 ENCOUNTER — Other Ambulatory Visit: Payer: Self-pay | Admitting: Internal Medicine

## 2012-04-21 ENCOUNTER — Ambulatory Visit (INDEPENDENT_AMBULATORY_CARE_PROVIDER_SITE_OTHER): Payer: PRIVATE HEALTH INSURANCE | Admitting: Internal Medicine

## 2012-04-21 ENCOUNTER — Encounter: Payer: Self-pay | Admitting: Internal Medicine

## 2012-04-21 VITALS — BP 148/80 | HR 93 | Temp 98.4°F | Wt 293.0 lb

## 2012-04-21 DIAGNOSIS — N476 Balanoposthitis: Secondary | ICD-10-CM

## 2012-04-21 DIAGNOSIS — N481 Balanitis: Secondary | ICD-10-CM

## 2012-04-21 DIAGNOSIS — R300 Dysuria: Secondary | ICD-10-CM

## 2012-04-21 LAB — POCT URINALYSIS DIPSTICK
Ketones, UA: NEGATIVE
Leukocytes, UA: NEGATIVE
Nitrite, UA: NEGATIVE
Protein, UA: NEGATIVE
Urobilinogen, UA: NEGATIVE
pH, UA: 7

## 2012-04-21 MED ORDER — FLUCONAZOLE 100 MG PO TABS: 100.0000 mg | ORAL_TABLET | Freq: Every day | ORAL | Status: DC

## 2012-04-21 MED ORDER — DOXYCYCLINE HYCLATE 100 MG PO TABS: 100.0000 mg | ORAL_TABLET | Freq: Two times a day (BID) | ORAL | Status: DC

## 2012-04-21 NOTE — Assessment & Plan Note (Signed)
With urethritis Urinalysis normal and no evidence of inflammation of any residual prostate  Will treat with fluconazole Gold bond powder locally Doxy if dysuria persists

## 2012-04-21 NOTE — Progress Notes (Signed)
Subjective:    Patient ID: [name], male    DOB: 1950-08-05, 62 y.o.   MRN: [id number]  HPI Has had some burning with urination in past couple of days Redness at head of penis No fever Has more urgency--nocturia x 2 which is unusual No hematuria  No ejaculation since last week---no retrograde ejaculation  Current Outpatient Prescriptions on File Prior to Visit  Medication Sig Dispense Refill  . aspirin 325 MG tablet Take 325 mg by mouth daily.        . brimonidine (ALPHAGAN) 0.15 % ophthalmic solution Place 1 drop into both eyes daily.        . carvedilol (COREG) 25 MG tablet Take 1 tablet (25 mg total) by mouth 2 (two) times daily with a meal.  180 tablet  3  . glipiZIDE (GLUCOTROL) 5 MG tablet TAKE 1 TABLET BY MOUTH DAILY  90 tablet  3  . glucose blood (ONE TOUCH ULTRA TEST) test strip Patient tests blood sugar once daily dx:250.60  100 each  1  . insulin detemir (LEVEMIR) 100 UNIT/ML injection Inject 100 Units into the skin daily.  90 mL  3  . losartan-hydrochlorothiazide (HYZAAR) 50-12.5 MG per tablet TAKE ONE BY MOUTH DAILY  90 tablet  3  . metFORMIN (GLUCOPHAGE) 1000 MG tablet TAKE 1 TABLET BY MOUTH TWICE A DAY  180 tablet  3  . Multiple Vitamin (MULTIVITAMIN) tablet Take 1 tablet by mouth daily.        Marland Kitchen triamcinolone (KENALOG) 0.025 % cream APPLY 1 APPLICATION TOPICALLY 3 (THREE) TIMES DAILY AS NEEDED.  30 g  1   No current facility-administered medications on file prior to visit.    Allergies  Allergen Reactions  . Atorvastatin     REACTION: myalgias  . Ezetimibe   . Simvastatin     Past Medical History  Diagnosis Date  . Malignant neoplasm of prostate   . Coronary atherosclerosis of unspecified type of vessel, native or graft   . Type II or unspecified type diabetes mellitus with neurological manifestations, not stated as uncontrolled(250.60)   . Impotence of organic origin   . Unspecified glaucoma(365.9)   . Internal hemorrhoids without mention of  complication   . Other and unspecified hyperlipidemia   . Mononeuritis of unspecified site   . Osteoarthrosis, unspecified whether generalized or localized, unspecified site   . Personal history of colonic polyps   . Routine general medical examination at a health care facility   . Hemorrhage of rectum and anus   . Unspecified sleep apnea   . Unspecified venous (peripheral) insufficiency   . Personal history of gallstones   . Personal history of diabetic foot ulcer     saw wound center, resolved 05/08/2010    Past Surgical History  Procedure Laterality Date  . Cardiac catheterization  1998    Negative  . Thrombosed vein  1993    Right leg  . Kidney stone surgery  04/1993  . Retinal detachment surgery  2002-2003  . Rca stents  04/2003    EF 55%  . Coronary artery bypass graft  09/2005    Post op AFIB  . Insertion prostate radiation seed  2009    RT and seeds for prostate cancer    Family History  Problem Relation Age of Onset  . Lung cancer Father   . Multiple sclerosis Mother   . Heart attack      paternal aunts and uncles    History   Social  History  . Marital Status: Widowed    Spouse Name: N/A    Number of Children: 3  . Years of Education: N/A   Occupational History  . Heavy equipment mechanic--retired    Social History Main Topics  . Smoking status: Former Smoker    Types: Cigars    Quit date: 01/07/2001  . Smokeless tobacco: Never Used  . Alcohol Use: No  . Drug Use: No  . Sexually Active: Not on file   Other Topics Concern  . Not on file   Social History Narrative  . No narrative on file   Review of Systems No constipation--tends to be loose Allergies have been acting up Appetite is fine No abdominal pain    Objective:   Physical Exam  Genitourinary:  Redness of glans and distal urethra No scrotal swelling or tenderness  Rectal shows non palpable prostate and no tenderness          Assessment & Plan:

## 2012-04-21 NOTE — Patient Instructions (Signed)
Please use gold bond or other powder daily If your painful urination persists after 3-4 days of the fluconazole, please start the doxycycline.

## 2012-06-30 ENCOUNTER — Ambulatory Visit (INDEPENDENT_AMBULATORY_CARE_PROVIDER_SITE_OTHER): Payer: No Typology Code available for payment source | Admitting: Internal Medicine

## 2012-06-30 ENCOUNTER — Encounter: Payer: Self-pay | Admitting: Internal Medicine

## 2012-06-30 VITALS — BP 128/70 | HR 84 | Temp 98.6°F | Wt 291.0 lb

## 2012-06-30 DIAGNOSIS — E1149 Type 2 diabetes mellitus with other diabetic neurological complication: Secondary | ICD-10-CM

## 2012-06-30 DIAGNOSIS — M159 Polyosteoarthritis, unspecified: Secondary | ICD-10-CM

## 2012-06-30 DIAGNOSIS — I2510 Atherosclerotic heart disease of native coronary artery without angina pectoris: Secondary | ICD-10-CM

## 2012-06-30 NOTE — Progress Notes (Signed)
Subjective:    Patient ID: [name], male    DOB: 1950-06-13, 62 y.o.   MRN: [id number]  HPI Here for follow up Doing okay Recent dental problems--- needed root canal and cap  Has lost a little more weight Trying to eat better Checks sugars every 2-3 weeks--- usually 120-160 Feet still numb---checks daily for problems  No chest pain No SOB No headaches No dizziness or fainting spells  Keeps up with urologist Almost 5 years since his cancer  Current Outpatient Prescriptions on File Prior to Visit  Medication Sig Dispense Refill  . aspirin 325 MG tablet Take 325 mg by mouth daily.        . brimonidine (ALPHAGAN) 0.15 % ophthalmic solution Place 1 drop into both eyes daily.        . carvedilol (COREG) 25 MG tablet Take 1 tablet (25 mg total) by mouth 2 (two) times daily with a meal.  180 tablet  3  . glipiZIDE (GLUCOTROL) 5 MG tablet TAKE 1 TABLET BY MOUTH DAILY  90 tablet  3  . glucose blood (ONE TOUCH ULTRA TEST) test strip Patient tests blood sugar once daily dx:250.60  100 each  1  . insulin detemir (LEVEMIR) 100 UNIT/ML injection Inject 100 Units into the skin daily.  90 mL  3  . losartan-hydrochlorothiazide (HYZAAR) 50-12.5 MG per tablet TAKE ONE BY MOUTH DAILY  90 tablet  3  . metFORMIN (GLUCOPHAGE) 1000 MG tablet TAKE 1 TABLET BY MOUTH TWICE A DAY  180 tablet  3  . Multiple Vitamin (MULTIVITAMIN) tablet Take 1 tablet by mouth daily.        Marland Kitchen triamcinolone (KENALOG) 0.025 % cream APPLY 1 APPLICATION TOPICALLY 3 (THREE) TIMES DAILY AS NEEDED.  30 g  1   No current facility-administered medications on file prior to visit.    Allergies  Allergen Reactions  . Atorvastatin     REACTION: myalgias  . Ezetimibe   . Simvastatin     Past Medical History  Diagnosis Date  . Malignant neoplasm of prostate   . Coronary atherosclerosis of unspecified type of vessel, native or graft   . Type II or unspecified type diabetes mellitus with neurological manifestations, not  stated as uncontrolled(250.60)   . Impotence of organic origin   . Unspecified glaucoma(365.9)   . Internal hemorrhoids without mention of complication   . Other and unspecified hyperlipidemia   . Mononeuritis of unspecified site   . Osteoarthrosis, unspecified whether generalized or localized, unspecified site   . Personal history of colonic polyps   . Routine general medical examination at a health care facility   . Hemorrhage of rectum and anus   . Unspecified sleep apnea   . Unspecified venous (peripheral) insufficiency   . Personal history of gallstones   . Personal history of diabetic foot ulcer     saw wound center, resolved 05/08/2010    Past Surgical History  Procedure Laterality Date  . Cardiac catheterization  1998    Negative  . Thrombosed vein  1993    Right leg  . Kidney stone surgery  04/1993  . Retinal detachment surgery  2002-2003  . Rca stents  04/2003    EF 55%  . Coronary artery bypass graft  09/2005    Post op AFIB  . Insertion prostate radiation seed  2009    RT and seeds for prostate cancer    Family History  Problem Relation Age of Onset  . Lung cancer Father   .  Multiple sclerosis Mother   . Heart attack      paternal aunts and uncles    History   Social History  . Marital Status: Widowed    Spouse Name: N/A    Number of Children: 3  . Years of Education: N/A   Occupational History  . Heavy equipment mechanic--retired    Social History Main Topics  . Smoking status: Former Smoker    Types: Cigars    Quit date: 01/07/2001  . Smokeless tobacco: Never Used  . Alcohol Use: No  . Drug Use: No  . Sexually Active: Not on file   Other Topics Concern  . Not on file   Social History Narrative  . No narrative on file   Review of Systems Has red spot on right thigh--wants it checked Appetite is okay Sleeps okay    Objective:   Physical Exam  Constitutional: He appears well-developed and well-nourished. No distress.  Neck: Normal range  of motion. Neck supple. No thyromegaly present.  Cardiovascular: Normal rate, regular rhythm, normal heart sounds and intact distal pulses.  Exam reveals no gallop.   No murmur heard. Faint but palpable pedal pulses  Pulmonary/Chest: Effort normal and breath sounds normal. No respiratory distress. He has no wheezes. He has no rales.  Musculoskeletal: He exhibits no tenderness.  Trace ankle edema  Lymphadenopathy:    He has no cervical adenopathy.  Neurological:  Decreased sensation in feet  Skin:  No foot ulcers  Psychiatric: He has a normal mood and affect. His behavior is normal.          Assessment & Plan:

## 2012-06-30 NOTE — Patient Instructions (Addendum)
Please call Dr Marlyne Beards to set up your diabetic eye exam. If your fasting blood sugars are regularly over 140, increase your levemir by 5 units.

## 2012-06-30 NOTE — Assessment & Plan Note (Signed)
Multiple joints involved No severe pain at this point

## 2012-06-30 NOTE — Assessment & Plan Note (Signed)
Quiet on asa, beta blocker LDL controlled without meds

## 2012-06-30 NOTE — Assessment & Plan Note (Signed)
Changed to levemir and we decreased dose If A1c up, will increase the levemir Needs eye exam

## 2012-09-03 ENCOUNTER — Encounter: Payer: Self-pay | Admitting: Internal Medicine

## 2012-09-03 ENCOUNTER — Ambulatory Visit (INDEPENDENT_AMBULATORY_CARE_PROVIDER_SITE_OTHER): Payer: No Typology Code available for payment source | Admitting: Internal Medicine

## 2012-09-03 VITALS — BP 140/80 | HR 79 | Temp 98.3°F | Wt 290.0 lb

## 2012-09-03 DIAGNOSIS — E1149 Type 2 diabetes mellitus with other diabetic neurological complication: Secondary | ICD-10-CM

## 2012-09-03 DIAGNOSIS — R109 Unspecified abdominal pain: Secondary | ICD-10-CM

## 2012-09-03 NOTE — Patient Instructions (Signed)
Please increase your levemir to 60 units twice a day

## 2012-09-03 NOTE — Progress Notes (Signed)
Subjective:    Patient ID: [name], male    DOB: 11/06/1950, 61 y.o.   MRN: [id number]  HPI Here with girlfriend  Has been trying to be more careful Has cut out a lot of his carbs Still some fried foods No set exercise--walking hurts hips, shoulders hurt also  Has increased the levemir to 100 units daily from 80 Fasting sugars still 200-220  Has had some right abdominal pain Thinks his gallbladder is acting up Some irritation there---more when in bed than post prandial Bowels are okay Has been staying with mom this month--doing personal care, moving her, etc  Current Outpatient Prescriptions on File Prior to Visit  Medication Sig Dispense Refill  . aspirin 325 MG tablet Take 325 mg by mouth daily.        . brimonidine (ALPHAGAN) 0.15 % ophthalmic solution Place 1 drop into both eyes daily.        . carvedilol (COREG) 25 MG tablet Take 1 tablet (25 mg total) by mouth 2 (two) times daily with a meal.  180 tablet  3  . glipiZIDE (GLUCOTROL) 5 MG tablet TAKE 1 TABLET BY MOUTH DAILY  90 tablet  3  . glucose blood (ONE TOUCH ULTRA TEST) test strip Patient tests blood sugar once daily dx:250.60  100 each  1  . insulin detemir (LEVEMIR) 100 UNIT/ML injection Inject 100 Units into the skin daily.  90 mL  3  . losartan-hydrochlorothiazide (HYZAAR) 50-12.5 MG per tablet TAKE ONE BY MOUTH DAILY  90 tablet  3  . metFORMIN (GLUCOPHAGE) 1000 MG tablet TAKE 1 TABLET BY MOUTH TWICE A DAY  180 tablet  3  . Multiple Vitamin (MULTIVITAMIN) tablet Take 1 tablet by mouth daily.        Marland Kitchen triamcinolone (KENALOG) 0.025 % cream APPLY 1 APPLICATION TOPICALLY 3 (THREE) TIMES DAILY AS NEEDED.  30 g  1   No current facility-administered medications on file prior to visit.    Allergies  Allergen Reactions  . Atorvastatin     REACTION: myalgias  . Ezetimibe   . Simvastatin     Past Medical History  Diagnosis Date  . Malignant neoplasm of prostate   . Coronary atherosclerosis of unspecified type  of vessel, native or graft   . Type II or unspecified type diabetes mellitus with neurological manifestations, not stated as uncontrolled(250.60)   . Impotence of organic origin   . Unspecified glaucoma(365.9)   . Internal hemorrhoids without mention of complication   . Other and unspecified hyperlipidemia   . Mononeuritis of unspecified site   . Osteoarthrosis, unspecified whether generalized or localized, unspecified site   . Personal history of colonic polyps   . Routine general medical examination at a health care facility   . Hemorrhage of rectum and anus   . Unspecified sleep apnea   . Unspecified venous (peripheral) insufficiency   . Personal history of gallstones   . Personal history of diabetic foot ulcer     saw wound center, resolved 05/08/2010    Past Surgical History  Procedure Laterality Date  . Cardiac catheterization  1998    Negative  . Thrombosed vein  1993    Right leg  . Kidney stone surgery  04/1993  . Retinal detachment surgery  2002-2003  . Rca stents  04/2003    EF 55%  . Coronary artery bypass graft  09/2005    Post op AFIB  . Insertion prostate radiation seed  2009    RT and  seeds for prostate cancer    Family History  Problem Relation Age of Onset  . Lung cancer Father   . Multiple sclerosis Mother   . Heart attack      paternal aunts and uncles    History   Social History  . Marital Status: Widowed    Spouse Name: N/A    Number of Children: 3  . Years of Education: N/A   Occupational History  . Heavy equipment mechanic--retired    Social History Main Topics  . Smoking status: Former Smoker    Types: Cigars    Quit date: 01/07/2001  . Smokeless tobacco: Never Used  . Alcohol Use: No  . Drug Use: No  . Sexual Activity: Not on file   Other Topics Concern  . Not on file   Social History Narrative  . No narrative on file   Review of Systems Weight only down 1# Sleeping okay    Objective:   Physical Exam  Constitutional: He  appears well-developed and well-nourished. No distress.  Abdominal: Soft. He exhibits no distension and no mass. There is tenderness. There is no rebound and no guarding.  Very mild RMQ tenderness around to his flank  Musculoskeletal:  No CVA tenderness  Psychiatric: He has a normal mood and affect. His behavior is normal.          Assessment & Plan:

## 2012-09-03 NOTE — Assessment & Plan Note (Signed)
Reassured him it seems to be abdominal wall related---probably from helping care for mom with MS Nothing to suggest gallbladder pain

## 2012-09-03 NOTE — Assessment & Plan Note (Signed)
Doesn't seem to be better Will change the levemir to 60units bid Referral to Dr Elvera Lennox

## 2012-10-05 ENCOUNTER — Encounter: Payer: Self-pay | Admitting: Internal Medicine

## 2012-10-05 ENCOUNTER — Other Ambulatory Visit: Payer: Self-pay | Admitting: Internal Medicine

## 2012-10-05 ENCOUNTER — Ambulatory Visit (INDEPENDENT_AMBULATORY_CARE_PROVIDER_SITE_OTHER): Payer: No Typology Code available for payment source | Admitting: Internal Medicine

## 2012-10-05 VITALS — BP 142/94 | HR 87 | Temp 98.0°F | Resp 12 | Ht 73.0 in | Wt 297.8 lb

## 2012-10-05 DIAGNOSIS — E1149 Type 2 diabetes mellitus with other diabetic neurological complication: Secondary | ICD-10-CM

## 2012-10-05 DIAGNOSIS — Z23 Encounter for immunization: Secondary | ICD-10-CM

## 2012-10-05 MED ORDER — INSULIN PEN NEEDLE 31G X 5 MM MISC: Status: DC

## 2012-10-05 MED ORDER — INSULIN ASPART 100 UNIT/ML ~~LOC~~ SOLN: SUBCUTANEOUS | Status: DC

## 2012-10-05 MED ORDER — INSULIN DETEMIR 100 UNIT/ML ~~LOC~~ SOLN: 60.0000 [IU] | Freq: Every day | SUBCUTANEOUS | Status: DC

## 2012-10-05 NOTE — Progress Notes (Signed)
Patient ID: [name], male   DOB: 1950/07/06, 62 y.o.   MRN: [id number]  HPI: [name] is a 62 y.o.-year-old male, referred by his PCP, Dr.Letvak, for management of DM2, insulin-dependent, uncontrolled, with complications (peripheral neuropathy, CAD-status post CABG in 2008, PVD, history of diabetic foot ulcer, history of retinal detachment in 2002-3). He is accompanied by his fiance, who offers parts of the history.  Patient has been diagnosed with diabetes in 2008; he started insulin at diagnosis.   Last hemoglobin A1c was: Lab Results  Component Value Date   HGBA1C 9.8* 06/30/2012  Previous HbA1C was 7.5% (12/2011), prev. 9.6% (07/2011).  Pt is on a regimen of: - Metformin 1000 mg po bid - Levemir 60 bid (increased from 100 units in HS) - he feels that he is not doing as good as he was doing on Lantus.  - Glipizide 5 mg am  Pt checks his sugars once a day - once a week and they are: - am: 190-260. Does not check later in the day. No lows. Lowest sugar was 140; ? hypoglycemia awareness. Highest sugar was 260.   Pt's meals are: - Breakfast: bacon, eggs, tomato sandwich (sometimes no bread) - Lunch: meat + 2 veggies (no potatoes) - Dinner: meat + 2 veggies - Snacks: 1 a day - 2x a week, icecream  Uses Splenda in tea and coffee. Not many sodas: 2 a week; drinks unsweet tea; drinks water  He had DM education in 2008.  - no CKD, last BUN/creatinine:  Lab Results  Component Value Date   BUN 16 12/31/2011   CREATININE 0.9 12/31/2011  On Losartan. - last set of lipids: Lab Results  Component Value Date   CHOL 135 12/31/2011   HDL 26.80* 12/31/2011   LDLCALC See Comment mg/dL 40/98/1191   LDLDIRECT 84.3 12/31/2011   TRIG 242.0* 12/31/2011   CHOLHDL 5 12/31/2011  Not on a statin as intolerant. - last eye exam was > 1 year . No DR.  - + numbness and tingling in his feet. No sensation in his feet.  I reviewed his chart and he also has a history of Prostate cancer, s/p  25 RxTx, now with radioactive seeds. Also, hyperlipidemia, hypertension, sleep apnea, osteoarthrosis. Last TSH 0.53 (12/2011).  Pt has FH of DM in mother, sister.  ROS: Constitutional: + weight gain/loss, no fatigue, no subjective hyperthermia/hypothermia Eyes: no blurry vision, no xerophthalmia ENT: no sore throat, no nodules palpated in throat, no dysphagia/odynophagia, no hoarseness; + decreased hearing Cardiovascular: no CP/SOB/palpitations/+ leg swelling Respiratory: no cough/SOB Gastrointestinal: no N/V/+ D/no C Musculoskeletal: + muscle/+ joint aches Skin: no rashes Neurological: no tremors/numbness/tingling/dizziness Psychiatric: no depression/anxiety Difficulty with erections  Past Medical History  Diagnosis Date  . Malignant neoplasm of prostate   . Coronary atherosclerosis of unspecified type of vessel, native or graft   . Type II or unspecified type diabetes mellitus with neurological manifestations, not stated as uncontrolled(250.60)   . Impotence of organic origin   . Unspecified glaucoma(365.9)   . Internal hemorrhoids without mention of complication   . Other and unspecified hyperlipidemia   . Mononeuritis of unspecified site   . Osteoarthrosis, unspecified whether generalized or localized, unspecified site   . Personal history of colonic polyps   . Routine general medical examination at a health care facility   . Hemorrhage of rectum and anus   . Unspecified sleep apnea   . Unspecified venous (peripheral) insufficiency   . Personal history of gallstones   .  Personal history of diabetic foot ulcer     saw wound center, resolved 05/08/2010   Past Surgical History  Procedure Laterality Date  . Cardiac catheterization  1998    Negative  . Thrombosed vein  1993    Right leg  . Kidney stone surgery  04/1993  . Retinal detachment surgery  2002-2003  . Rca stents  04/2003    EF 55%  . Coronary artery bypass graft  09/2005    Post op AFIB  . Insertion prostate  radiation seed  2009    RT and seeds for prostate cancer   History   Social History  . Marital Status: Widowed; fiancee     Spouse Name: N/A    Number of Children: 3   Occupational History  . Heavy equipment mechanic--retired    Social History Main Topics  . Smoking status: Former Smoker    Types: Cigars    Quit date: 01/07/2001  . Smokeless tobacco: Never Used  . Alcohol Use: Yes  . Drug Use: No  . Sexual Activity: Yes    Partners: Female   Other Topics Concern  . Not on file   Social History Narrative   Regular exercise: seldom   Caffeine use: coffee daily   Current Outpatient Prescriptions on File Prior to Visit  Medication Sig Dispense Refill  . aspirin 325 MG tablet Take 325 mg by mouth daily.        . brimonidine (ALPHAGAN) 0.15 % ophthalmic solution Place 1 drop into both eyes daily.        . carvedilol (COREG) 25 MG tablet Take 1 tablet (25 mg total) by mouth 2 (two) times daily with a meal.  180 tablet  3  . glucose blood (ONE TOUCH ULTRA TEST) test strip Patient tests blood sugar once daily dx:250.60  100 each  1  . losartan-hydrochlorothiazide (HYZAAR) 50-12.5 MG per tablet TAKE ONE BY MOUTH DAILY  90 tablet  3  . metFORMIN (GLUCOPHAGE) 1000 MG tablet TAKE 1 TABLET BY MOUTH TWICE A DAY  180 tablet  3  . Multiple Vitamin (MULTIVITAMIN) tablet Take 1 tablet by mouth daily.        Marland Kitchen triamcinolone (KENALOG) 0.025 % cream APPLY 1 APPLICATION TOPICALLY 3 (THREE) TIMES DAILY AS NEEDED.  30 g  1   No current facility-administered medications on file prior to visit.   Allergies  Allergen Reactions  . Atorvastatin     REACTION: myalgias  . Ezetimibe   . Simvastatin    Family History  Problem Relation Age of Onset  . Lung cancer Father   . Multiple sclerosis Mother   . Heart attack      paternal aunts and uncles   PE: BP 142/94  Pulse 87  Temp(Src) 98 F (36.7 C) (Oral)  Resp 12  Ht 6\' 1"  (1.854 m)  Wt 297 lb 12.8 oz (135.081 kg)  BMI 39.3 kg/m2  SpO2  96% Wt Readings from Last 3 Encounters:  10/05/12 297 lb 12.8 oz (135.081 kg)  09/03/12 290 lb (131.543 kg)  06/30/12 291 lb (131.997 kg)   Constitutional: obese, in NAD Eyes: PERRLA, EOMI, no exophthalmos ENT: moist mucous membranes, no thyromegaly, no cervical lymphadenopathy; large neck Cardiovascular: RRR, No MRG, + bilateral leg edema, pitting. Respiratory: CTA B Gastrointestinal: abdomen soft, NT, ND, BS+ Musculoskeletal: no deformities, strength intact in all 4 Skin: moist, warm, stasis dermatitis in bilateral legs Neurological: mild tremor with outstretched hands, DTR normal in all 4  ASSESSMENT: 1.  DM2, insulin-dependent, uncontrolled, with complications - peripheral neuropathy - CAD-status post stent in 2005, then 5v CABG in 2008 - PVD - history of diabetic foot ulcer - history of retinal detachment in 2002-3  PLAN:  1. Patient with several years of DM2, recently more uncontrolled diabetes, on basal insulin + oral antidiabetic regimen, which became insufficient - We discussed about options for treatment, and I suggested to:  Please decrease Levemir to 60 units at night.  Start Novolog 12 units before breakfast, lunch, and dinner. Please inject the insulin 15 minutes before the meal. (I sent the Rx with 15 units tid as I believe we need to increase soon to this dose) Stop Glipizide. Please call me or send me a message through MyChart if your sugars are consistently < 80 or > 200. Please return in 1 month with your sugar log.  - discussed proper injection techniques:  Preference for the abdomin sq tissue  Rotation of site  Change needle for each injection  Keep needle in for 10 sec after last unit of insulin in - Strongly advised her to start checking her sugars at different times of the day - check 2-3 times a day, rotating checks - given sugar log and advised how to fill it and to bring it at next appt  - given foot care handout and explained the principles  -  given instructions for hypoglycemia management "15-15 rule"  - advised for yearly eye exams - given flu vaccine today - check HbA1C and CMP today. No ACR as he is on Cozaar. - Return to clinic in one month with sugar log   Orders Only on 10/05/2012  Component Date Value Range Status  . Sodium 10/05/2012 140  135 - 145 mEq/L Final  . Potassium 10/05/2012 4.2  3.5 - 5.3 mEq/L Final  . Chloride 10/05/2012 104  96 - 112 mEq/L Final  . CO2 10/05/2012 29  19 - 32 mEq/L Final  . Glucose, Bld 10/05/2012 273* 70 - 99 mg/dL Final  . BUN 16/10/9602 17  6 - 23 mg/dL Final  . Creat 54/09/8117 0.98  0.50 - 1.35 mg/dL Final  . Total Bilirubin 10/05/2012 0.5  0.3 - 1.2 mg/dL Final  . Alkaline Phosphatase 10/05/2012 43  39 - 117 U/L Final  . AST 10/05/2012 16  0 - 37 U/L Final  . ALT 10/05/2012 21  0 - 53 U/L Final  . Total Protein 10/05/2012 6.4  6.0 - 8.3 g/dL Final  . Albumin 14/78/2956 4.3  3.5 - 5.2 g/dL Final  . Calcium 21/30/8657 9.9  8.4 - 10.5 mg/dL Final  . Hemoglobin Q4O 10/05/2012 8.7* <5.7 % Final   Comment:                                                                                                 According to the ADA Clinical Practice Recommendations for 2011, when                          HbA1c is used as a screening test:                                                       >=  6.5%   Diagnostic of Diabetes Mellitus                                     (if abnormal result is confirmed)                                                     5.7-6.4%   Increased risk of developing Diabetes Mellitus                                                     References:Diagnosis and Classification of Diabetes Mellitus,Diabetes                          Care,2011,34(Suppl 1):S62-S69 and Standards of Medical Care in                                  Diabetes - 2011,Diabetes Care,2011,34 (Suppl 1):S11-S61.                             . Mean Plasma Glucose 10/05/2012 203* <117 mg/dL Final   NWG9F  improved. No CKD. Pt to be called with results. Continue with above tx plan.

## 2012-10-05 NOTE — Patient Instructions (Addendum)
Please decrease Levemir to 60 units at night. Start Novolog 12 units before breakfast, lunch, and dinner. Please inject the insulin 15 minutes before the meal. Stop Glipizide. Please call me or send me a message through MyChart if your sugars are consistently < 80 or > 200. Please return in 1 month with your sugar log.   PATIENT INSTRUCTIONS FOR TYPE 2 DIABETES:  **Please join MyChart!** - see attached instructions about how to join   DIET AND EXERCISE Diet and exercise is an important part of diabetic treatment.  We recommended aerobic exercise in the form of brisk walking (working between 40-60% of maximal aerobic capacity, similar to brisk walking) for 150 minutes per week (such as 30 minutes five days per week) along with 3 times per week performing 'resistance' training (using various gauge rubber tubes with handles) 5-10 exercises involving the major muscle groups (upper body, lower body and core) performing 10-15 repetitions (or near fatigue) each exercise. Start at half the above goal but build slowly to reach the above goals. If limited by weight, joint pain, or disability, we recommend daily walking in a swimming pool with water up to waist to reduce pressure from joints while allow for adequate exercise.    BLOOD GLUCOSES Monitoring your blood glucoses is important for continued management of your diabetes. Please check your blood glucoses 2-4 times a day: fasting, before meals and at bedtime (you can rotate these measurements - e.g. one day check before the 3 meals, the next day check before 2 of the meals and before bedtime, etc.   HYPOGLYCEMIA (low blood sugar) Hypoglycemia is usually a reaction to not eating, exercising, or taking too much insulin/ other diabetes drugs.  Symptoms include tremors, sweating, hunger, confusion, headache, etc. Treat IMMEDIATELY with 15 grams of Carbs:   4 glucose tablets    cup regular juice/soda   2 tablespoons raisins   4 teaspoons sugar   1  tablespoon honey Recheck blood glucose in 15 mins and repeat above if still symptomatic/blood glucose <100. Please contact our office at [phone] if you have questions about how to next handle your insulin.  RECOMMENDATIONS TO REDUCE YOUR RISK OF DIABETIC COMPLICATIONS: * Take your prescribed MEDICATION(S). * Follow a DIABETIC diet: Complex carbs, fiber rich foods, heart healthy fish twice weekly, (monounsaturated and polyunsaturated) fats * AVOID saturated/trans fats, high fat foods, >2,300 mg salt per day. * EXERCISE at least 5 times a week for 30 minutes or preferably daily.  * DO NOT SMOKE OR DRINK more than 1 drink a day. * Check your FEET every day. Do not wear tightfitting shoes. Contact us if you develop an ulcer * See your EYE doctor once a year or more if needed * Get a FLU shot once a year * Get a PNEUMONIA vaccine once before and once after age 49 years  GOALS:  * Your Hemoglobin A1c of <7%  * fasting sugars need to be <130 * after meals sugars need to be <180 (2h after you start eating) * Your Systolic BP should be 140 or lower  * Your Diastolic BP should be 80 or lower  * Your HDL (Good Cholesterol) should be 40 or higher  * Your LDL (Bad Cholesterol) should be 100 or lower  * Your Triglycerides should be 150 or lower  * Your Urine microalbumin (kidney function) should be <30 * Your Body Mass Index should be 25 or lower   We will be glad to help you achieve these goals.  Our telephone number is: [phone].

## 2012-10-06 ENCOUNTER — Telehealth: Payer: Self-pay | Admitting: *Deleted

## 2012-10-06 LAB — COMPREHENSIVE METABOLIC PANEL
ALT: 21 U/L (ref 0–53)
AST: 16 U/L (ref 0–37)
Albumin: 4.3 g/dL (ref 3.5–5.2)
Alkaline Phosphatase: 43 U/L (ref 39–117)
Chloride: 104 mEq/L (ref 96–112)
Creat: 0.98 mg/dL (ref 0.50–1.35)
Potassium: 4.2 mEq/L (ref 3.5–5.3)
Sodium: 140 mEq/L (ref 135–145)
Total Protein: 6.4 g/dL (ref 6.0–8.3)

## 2012-10-06 NOTE — Telephone Encounter (Signed)
Spoke with pt's girlfriend and advised her that pt's HgbA1c is a little better, at 8.7% (was 9.8). His kidney and liver tests are normal. Sugar 273, high. Continue with adding mealtime insulin as we discussed. Dr Elvera Lennox said he should let us know if insurance covers Humalog or Apidra instead of Novolog and need to send that if this is the case. Pt understood. They will let us know.

## 2012-10-15 ENCOUNTER — Encounter: Payer: Self-pay | Admitting: Gastroenterology

## 2012-10-15 ENCOUNTER — Telehealth: Payer: Self-pay | Admitting: *Deleted

## 2012-10-15 NOTE — Telephone Encounter (Signed)
Called pt back and advised him to increase Novolog to 18 units before breakfast, lunch, and dinner. Call in several days with sugars and you're doing a great job in checking them. Pt understood and stated he would do that.

## 2012-10-15 NOTE — Telephone Encounter (Signed)
Increase Novolog to 18 units before breakfast, lunch, and dinner. Call in several days with sugars - doing a great job in checking them!

## 2012-10-15 NOTE — Telephone Encounter (Signed)
Pt called with his bg readings:   AM  Before lunch  Before dinner  Bedtime  Mon 268  259   279   302  Tues 280  249   244   395  Wed 307  269   279   ---  Thurs 314  Please advise.

## 2012-10-16 ENCOUNTER — Other Ambulatory Visit: Payer: Self-pay | Admitting: Internal Medicine

## 2012-10-30 ENCOUNTER — Telehealth: Payer: Self-pay | Admitting: Internal Medicine

## 2012-10-30 ENCOUNTER — Other Ambulatory Visit: Payer: Self-pay | Admitting: *Deleted

## 2012-10-30 MED ORDER — GLUCOSE BLOOD VI STRP: ORAL_STRIP | Status: DC

## 2012-10-30 MED ORDER — INSULIN LISPRO 100 UNIT/ML (KWIKPEN): PEN_INJECTOR | SUBCUTANEOUS | Status: DC

## 2012-11-02 ENCOUNTER — Ambulatory Visit (INDEPENDENT_AMBULATORY_CARE_PROVIDER_SITE_OTHER): Payer: No Typology Code available for payment source | Admitting: Internal Medicine

## 2012-11-02 ENCOUNTER — Encounter: Payer: Self-pay | Admitting: Internal Medicine

## 2012-11-02 ENCOUNTER — Other Ambulatory Visit: Payer: Self-pay | Admitting: *Deleted

## 2012-11-02 VITALS — BP 128/80 | HR 78 | Temp 98.2°F | Resp 10 | Wt 295.9 lb

## 2012-11-02 DIAGNOSIS — E1149 Type 2 diabetes mellitus with other diabetic neurological complication: Secondary | ICD-10-CM

## 2012-11-02 MED ORDER — GLUCOSE BLOOD VI STRP: ORAL_STRIP | Status: DC

## 2012-11-02 MED ORDER — INSULIN DETEMIR 100 UNIT/ML ~~LOC~~ SOLN: 65.0000 [IU] | Freq: Every day | SUBCUTANEOUS | Status: DC

## 2012-11-02 MED ORDER — INSULIN LISPRO 100 UNIT/ML (KWIKPEN): PEN_INJECTOR | SUBCUTANEOUS | Status: DC

## 2012-11-02 NOTE — Telephone Encounter (Signed)
Sending a new rx for test strips, to see if ins will cover this brand.

## 2012-11-02 NOTE — Patient Instructions (Signed)
Please increase Levemir at 65 units. Please change Humalog as follows: 18 units with a smaller meal and 22 units with a larger meal. Also, use the following Humalog sliding scale: - 150-175: + 1 unit  - 176-200: + 2 units  - 201-225: + 3 units  - 226-250: + 4 units  - 251-275: + 5 units - 276-300: + 6 units Please return in 2 months with your sugar log.  Please call me with sugars <80 or >200 consistently.

## 2012-11-02 NOTE — Progress Notes (Signed)
Patient ID: [name], male   DOB: [birth year], 62 y.o.   MRN: [id number]  HPI: [name] is a 62 y.o.-year-old male, returning for f/u for DM2, dx 2008, insulin-dependent since dx, uncontrolled, with complications (peripheral neuropathy, CAD-status post CABG in 2008, PVD, history of diabetic foot ulcer, history of retinal detachment in 2002-3). He is accompanied by his fiance, who offers parts of the history.   Last hemoglobin A1c was: Lab Results  Component Value Date   HGBA1C 8.7* 10/05/2012   HGBA1C 9.8* 06/30/2012   HGBA1C 7.5* 12/31/2011   Pt is on a regimen of: - Metformin 1000 mg po bid - Levemir 60 bid (increased from 100 units in HS) - he feels that he is not doing as good as he was doing on Lantus.  - Humalog 18 units tid (increased since last visit as sugars stayed in the 200s) We stopped Glipizide 5 mg at last visit.  Pt checks his sugars 3x a day and they are: - am: 190-260.>> 160s recently - before lunch: 165-198 - before dinner: 159-182 - bedtime: 162-246 No lows.  ? hypoglycemia awareness. Highest sugar was 398 x 1.  Pt's meals are: - Breakfast: bacon, eggs, tomato sandwich (sometimes no bread) - Lunch: meat + 2 veggies (no potatoes) - Dinner: meat + 2 veggies - Snacks: 1 a day - 2x a week, icecream  Uses Splenda in tea and coffee. Not many sodas: 2 a week; drinks unsweet tea; drinks water  He had DM education in 2008.  - no CKD, last BUN/creatinine:  Lab Results  Component Value Date   BUN 17 10/05/2012   CREATININE 0.98 10/05/2012  On Losartan. - last set of lipids: Lab Results  Component Value Date   CHOL 135 12/31/2011   HDL 26.80* 12/31/2011   LDLCALC See Comment mg/dL 40/98/1191   LDLDIRECT 84.3 12/31/2011   TRIG 242.0* 12/31/2011   CHOLHDL 5 12/31/2011  Not on a statin as intolerant. - last eye exam was > 1 year . No DR.  - + numbness and tingling in his feet. No sensation in his feet.  He also has a history of Prostate cancer, s/p 25  RxTx, now with radioactive seeds. Also, hyperlipidemia, hypertension, sleep apnea, osteoarthrosis.   ROS: Constitutional: + weight gain/loss, no fatigue, no subjective hyperthermia/hypothermia Eyes: no blurry vision, no xerophthalmia ENT: no sore throat, no nodules palpated in throat, no dysphagia/odynophagia, no hoarseness; + decreased hearing Cardiovascular: no CP/SOB/palpitations/+ leg swelling Respiratory: no cough/SOB Gastrointestinal: no N/V/+ D/no C Musculoskeletal: + muscle/+ joint aches Skin: no rashes Neurological: no tremors/numbness/tingling/dizziness Psychiatric: no depression/anxiety Difficulty with erections  PE: BP 128/80  Pulse 78  Temp(Src) 98.2 F (36.8 C) (Oral)  Resp 10  Wt 295 lb 14.4 oz (134.219 kg)  BMI 39.05 kg/m2  SpO2 96% Wt Readings from Last 3 Encounters:  11/02/12 295 lb 14.4 oz (134.219 kg)  10/05/12 297 lb 12.8 oz (135.081 kg)  09/03/12 290 lb (131.543 kg)   Constitutional: obese, in NAD Eyes: PERRLA, EOMI, no exophthalmos ENT: moist mucous membranes, no thyromegaly, no cervical lymphadenopathy; large neck Cardiovascular: RRR, No MRG, + bilateral leg edema, pitting. Respiratory: CTA B Gastrointestinal: abdomen soft, NT, ND, BS+ Musculoskeletal: no deformities, strength intact in all 4 Skin: moist, warm, stasis dermatitis in bilateral legs Neurological: mild tremor with outstretched hands, DTR normal in all 4  ASSESSMENT: 1. DM2, insulin-dependent, uncontrolled, with complications - peripheral neuropathy - CAD-status post stent in 2005, then 5v CABG in 2008 -  PVD - history of diabetic foot ulcer - history of retinal detachment in 2002-3  PLAN:  1. Patient with several years of DM2, recently more uncontrolled diabetes, on basal insulin + oral antidiabetic regimen, which became insufficient -  I suggested to:  Patient Instructions  Please keep Levemir at 65 units 2x daily Please change Humalog as follows: 18 units with a smaller meal  and 22 units with a larger meal. Also, use the following Humalog sliding scale: - 150-175: + 1 unit  - 176-200: + 2 units  - 201-225: + 3 units  - 226-250: + 4 units  - 251-275: + 5 units - 276-300: + 6 units Please return in 2 months with your sugar log.  Please call me with sugars <80 or >200 consistently. - continue checking sugars at different times of the day - check 2-3 times a day, rotating checks - advised for yearly eye exams - given flu vaccine at last visit - Return to clinic in one month with sugar log

## 2012-11-23 ENCOUNTER — Encounter: Payer: Self-pay | Admitting: Cardiovascular Disease

## 2012-11-23 ENCOUNTER — Encounter: Payer: Self-pay | Admitting: Pulmonary Disease

## 2012-11-23 ENCOUNTER — Encounter: Payer: Self-pay | Admitting: Cardiology

## 2012-11-23 ENCOUNTER — Encounter: Payer: Self-pay | Admitting: Gastroenterology

## 2012-11-26 ENCOUNTER — Other Ambulatory Visit: Payer: Self-pay | Admitting: *Deleted

## 2012-11-26 MED ORDER — INSULIN PEN NEEDLE 31G X 5 MM MISC: Status: DC

## 2012-11-26 MED ORDER — INSULIN LISPRO 100 UNIT/ML (KWIKPEN): PEN_INJECTOR | SUBCUTANEOUS | Status: DC

## 2012-12-10 ENCOUNTER — Other Ambulatory Visit: Payer: Self-pay | Admitting: *Deleted

## 2012-12-10 MED ORDER — GLUCOSE BLOOD VI STRP: ORAL_STRIP | Status: DC

## 2012-12-10 NOTE — Telephone Encounter (Signed)
We had to change pt's meter/strips that insurance would help cover. Pt agreed.

## 2012-12-15 ENCOUNTER — Ambulatory Visit (INDEPENDENT_AMBULATORY_CARE_PROVIDER_SITE_OTHER): Payer: No Typology Code available for payment source | Admitting: Family Medicine

## 2012-12-15 ENCOUNTER — Encounter: Payer: Self-pay | Admitting: Family Medicine

## 2012-12-15 VITALS — BP 138/82 | HR 88 | Temp 98.1°F | Ht 73.0 in | Wt 296.5 lb

## 2012-12-15 DIAGNOSIS — E11621 Type 2 diabetes mellitus with foot ulcer: Secondary | ICD-10-CM | POA: Insufficient documentation

## 2012-12-15 DIAGNOSIS — E1169 Type 2 diabetes mellitus with other specified complication: Secondary | ICD-10-CM

## 2012-12-15 DIAGNOSIS — L97509 Non-pressure chronic ulcer of other part of unspecified foot with unspecified severity: Secondary | ICD-10-CM

## 2012-12-15 MED ORDER — SULFAMETHOXAZOLE-TMP DS 800-160 MG PO TABS: 2.0000 | ORAL_TABLET | Freq: Two times a day (BID) | ORAL | Status: DC

## 2012-12-15 NOTE — Progress Notes (Signed)
Pre-visit discussion using our clinic review tool. No additional management support is needed unless otherwise documented below in the visit note.  

## 2012-12-15 NOTE — Patient Instructions (Signed)
Take antibiotic as directed Keep foot clean with antibacterial soap and water and cover wound lightly  Stop up front for wound care center refill

## 2012-12-15 NOTE — Progress Notes (Signed)
Subjective:    Patient ID: [name], male    DOB: 04/25/1950, 62 y.o.   MRN: [id number]  HPI  Here with a foot wound and swelling Started swelling on Friday and then he noticed the wound    (he had a blood blister there previously- and then it popped open) Is draining some liquid with odor  He propped it up on sat  Not a lot of feeling in his foot   No fever   DM- sees Dr Elvera Lennox and getting better control   He does have hx of going to wound center for staph - was the same foot - in 2012   Patient Active Problem List   Diagnosis Date Noted  . Diabetic foot ulcer 12/15/2012  . Abdominal wall pain in right flank 09/03/2012  . Type II or unspecified type diabetes mellitus with neurological manifestations, uncontrolled(250.62) 07/19/2011  . Routine general medical examination at a health care facility 12/25/2010  . HEMORRHOIDS-INTERNAL 11/22/2009  . PERSONAL HX COLONIC POLYPS 11/22/2009  . NEUROPATHY 07/03/2009  . VENOUS INSUFFICIENCY 06/29/2008  . ADENOCARCINOMA, PROSTATE 04/08/2007  . Osteoarthritis, multiple sites 04/03/2007  . ERECTILE DYSFUNCTION, ORGANIC 06/13/2006  . HYPERLIPIDEMIA 06/11/2006  . GLAUCOMA 06/11/2006  . CORONARY ARTERY DISEASE 06/11/2006  . SLEEP APNEA 06/11/2006   Past Medical History  Diagnosis Date  . Malignant neoplasm of prostate   . Coronary atherosclerosis of unspecified type of vessel, native or graft   . Type II or unspecified type diabetes mellitus with neurological manifestations, not stated as uncontrolled(250.60)   . Impotence of organic origin   . Unspecified glaucoma(365.9)   . Internal hemorrhoids without mention of complication   . Other and unspecified hyperlipidemia   . Mononeuritis of unspecified site   . Osteoarthrosis, unspecified whether generalized or localized, unspecified site   . Personal history of colonic polyps   . Routine general medical examination at a health care facility   . Hemorrhage of rectum and anus   .  Unspecified sleep apnea   . Unspecified venous (peripheral) insufficiency   . Personal history of gallstones   . Personal history of diabetic foot ulcer     saw wound center, resolved 05/08/2010   Past Surgical History  Procedure Laterality Date  . Cardiac catheterization  1998    Negative  . Thrombosed vein  1993    Right leg  . Kidney stone surgery  04/1993  . Retinal detachment surgery  2002-2003  . Rca stents  04/2003    EF 55%  . Coronary artery bypass graft  09/2005    Post op AFIB  . Insertion prostate radiation seed  2009    RT and seeds for prostate cancer   History  Substance Use Topics  . Smoking status: Former Smoker    Types: Cigars    Quit date: 01/07/2001  . Smokeless tobacco: Never Used  . Alcohol Use: Yes     Comment: occ   Family History  Problem Relation Age of Onset  . Lung cancer Father   . Multiple sclerosis Mother   . Heart attack      paternal aunts and uncles   Allergies  Allergen Reactions  . Atorvastatin     REACTION: myalgias  . Ezetimibe   . Simvastatin    Current Outpatient Prescriptions on File Prior to Visit  Medication Sig Dispense Refill  . aspirin 325 MG tablet Take 325 mg by mouth daily.        . brimonidine (  ALPHAGAN) 0.15 % ophthalmic solution Place 1 drop into both eyes daily.        . carvedilol (COREG) 25 MG tablet Take 1 tablet (25 mg total) by mouth 2 (two) times daily with a meal.  180 tablet  3  . glucose blood (FREESTYLE INSULINX TEST) test strip Test blood sugar 3 times daily as instructed. Dx code: 250.62  300 each  3  . insulin detemir (LEVEMIR) 100 UNIT/ML injection Inject 0.65 mLs (65 Units total) into the skin at bedtime.  20 mL  11  . insulin lispro (HUMALOG PEN) 100 UNIT/ML SOPN Inject up to 25 units into the skin before each of the 3 meals. Dx code: 250.60.  25 pen  3  . Insulin Pen Needle (FIFTY50 PEN NEEDLES) 31G X 5 MM MISC Use 3 times daily to inject insulin dx: 250.60  300 each  3  .  losartan-hydrochlorothiazide (HYZAAR) 50-12.5 MG per tablet TAKE ONE BY MOUTH DAILY  90 tablet  3  . metFORMIN (GLUCOPHAGE) 1000 MG tablet TAKE 1 TABLET BY MOUTH TWICE A DAY  180 tablet  3  . Multiple Vitamin (MULTIVITAMIN) tablet Take 1 tablet by mouth daily.        Marland Kitchen NOVOLOG FLEXPEN 100 UNIT/ML SOPN FlexPen       . triamcinolone (KENALOG) 0.025 % cream APPLY 1 APPLICATION TOPICALLY 3 (THREE) TIMES DAILY AS NEEDED.  30 g  1   No current facility-administered medications on file prior to visit.    Review of Systems Review of Systems  Constitutional: Negative for fever, appetite change, fatigue and unexpected weight change.  Eyes: Negative for pain and visual disturbance.  Respiratory: Negative for cough and shortness of breath.   Cardiovascular: Negative for cp or palpitations    Gastrointestinal: Negative for nausea, diarrhea and constipation.  Genitourinary: Negative for urgency and frequency.  Skin: Negative for pallor or rash  pos for wound on foot that does not hurt Neurological: Negative for weakness, light-headedness, and headaches. pos for numbness of feet  Hematological: Negative for adenopathy. Does not bruise/bleed easily.  Psychiatric/Behavioral: Negative for dysphoric mood. The patient is not nervous/anxious.         Objective:   Physical Exam  Constitutional: He appears well-developed and well-nourished. No distress.  obese and well appearing   HENT:  Head: Normocephalic and atraumatic.  Eyes: Conjunctivae and EOM are normal. Pupils are equal, round, and reactive to light.  Neck: Normal range of motion. Neck supple.  Cardiovascular: Normal rate and regular rhythm.   Pedal pulses unpalpable  Feet are warm and seemingly well perfused however   Pulmonary/Chest: Effort normal and breath sounds normal.  Musculoskeletal: He exhibits edema.  Lymphadenopathy:    He has no cervical adenopathy.  Neurological: He is alert. He has normal reflexes. A sensory deficit is present.   No sens to light touch or pain on feet superficially  Skin: Skin is warm and dry. There is erythema.  Ulcer with callus on L great toe- about 3-4 mm deep and 1-2 mm diameter with tan clear fluid drainage and no tenderness   Erythema to ankle with moderate swelling   Psychiatric: He has a normal mood and affect.          Assessment & Plan:

## 2012-12-16 NOTE — Assessment & Plan Note (Addendum)
Recurrent on L great toe (per pt a site of a "blood blister" there for 6 mo) In setting of DM and neuropathy and PVD  Also hx of staph in the past  tx with bactrim DD Watch closely /infection-f/u planned Also ref to wound center in light of much difficulty healing this in the past  inst given re: dressing  inst to call or seek care if worse redness /swelling or fever or other symptoms

## 2012-12-20 LAB — WOUND CULTURE: Gram Stain: NONE SEEN

## 2012-12-23 ENCOUNTER — Encounter: Payer: Self-pay | Admitting: Internal Medicine

## 2012-12-23 ENCOUNTER — Ambulatory Visit (INDEPENDENT_AMBULATORY_CARE_PROVIDER_SITE_OTHER): Payer: No Typology Code available for payment source | Admitting: Internal Medicine

## 2012-12-23 VITALS — BP 118/74 | HR 82 | Temp 97.7°F | Wt 291.0 lb

## 2012-12-23 DIAGNOSIS — L97509 Non-pressure chronic ulcer of other part of unspecified foot with unspecified severity: Secondary | ICD-10-CM

## 2012-12-23 DIAGNOSIS — E11621 Type 2 diabetes mellitus with foot ulcer: Secondary | ICD-10-CM

## 2012-12-23 DIAGNOSIS — E1169 Type 2 diabetes mellitus with other specified complication: Secondary | ICD-10-CM

## 2012-12-23 MED ORDER — CIPROFLOXACIN HCL 500 MG PO TABS: 500.0000 mg | ORAL_TABLET | Freq: Two times a day (BID) | ORAL | Status: DC

## 2012-12-23 NOTE — Patient Instructions (Signed)
Please start the ciprofloxacin and take it twice a day

## 2012-12-23 NOTE — Assessment & Plan Note (Signed)
Persists Not treating the Pseudomonas that was found---will change to cipro Has what sounds like a fistula--I am concerned about osteo  Will change to cipro Has appt next week Will reevaluate and check x-ray Surgeon if x-ray shows bone involvement and no improvement on the cipro---otherwise may just need extended Rx Faint pulse--- arterial studies normal 2 years ago----may need to recheck  Redressed with antibiotic ointment and sterile dressing

## 2012-12-23 NOTE — Progress Notes (Signed)
Subjective:    Patient ID: [name], male    DOB: November 06, 1950, 62 y.o.   MRN: [id number]  HPI Here with significant other, Almyra Free  Foot is better Not really healing completely though Swelling is much better Toe is not as big and the foot is better also  Current Outpatient Prescriptions on File Prior to Visit  Medication Sig Dispense Refill  . aspirin 325 MG tablet Take 325 mg by mouth daily.        . brimonidine (ALPHAGAN) 0.15 % ophthalmic solution Place 1 drop into both eyes daily.        . carvedilol (COREG) 25 MG tablet Take 1 tablet (25 mg total) by mouth 2 (two) times daily with a meal.  180 tablet  3  . glucose blood (FREESTYLE INSULINX TEST) test strip Test blood sugar 3 times daily as instructed. Dx code: 250.62  300 each  3  . insulin detemir (LEVEMIR) 100 UNIT/ML injection Inject 0.65 mLs (65 Units total) into the skin at bedtime.  20 mL  11  . insulin lispro (HUMALOG PEN) 100 UNIT/ML SOPN Inject up to 25 units into the skin before each of the 3 meals. Dx code: 250.60.  25 pen  3  . Insulin Pen Needle (FIFTY50 PEN NEEDLES) 31G X 5 MM MISC Use 3 times daily to inject insulin dx: 250.60  300 each  3  . losartan-hydrochlorothiazide (HYZAAR) 50-12.5 MG per tablet TAKE ONE BY MOUTH DAILY  90 tablet  3  . metFORMIN (GLUCOPHAGE) 1000 MG tablet TAKE 1 TABLET BY MOUTH TWICE A DAY  180 tablet  3  . Multiple Vitamin (MULTIVITAMIN) tablet Take 1 tablet by mouth daily.        Marland Kitchen NOVOLOG FLEXPEN 100 UNIT/ML SOPN FlexPen       . sulfamethoxazole-trimethoprim (BACTRIM DS) 800-160 MG per tablet Take 2 tablets by mouth 2 (two) times daily.  40 tablet  0  . triamcinolone (KENALOG) 0.025 % cream APPLY 1 APPLICATION TOPICALLY 3 (THREE) TIMES DAILY AS NEEDED.  30 g  1   No current facility-administered medications on file prior to visit.    Allergies  Allergen Reactions  . Atorvastatin     REACTION: myalgias  . Ezetimibe   . Simvastatin     Past Medical History  Diagnosis Date  .  Malignant neoplasm of prostate   . Coronary atherosclerosis of unspecified type of vessel, native or graft   . Type II or unspecified type diabetes mellitus with neurological manifestations, not stated as uncontrolled(250.60)   . Impotence of organic origin   . Unspecified glaucoma(365.9)   . Internal hemorrhoids without mention of complication   . Other and unspecified hyperlipidemia   . Mononeuritis of unspecified site   . Osteoarthrosis, unspecified whether generalized or localized, unspecified site   . Personal history of colonic polyps   . Routine general medical examination at a health care facility   . Hemorrhage of rectum and anus   . Unspecified sleep apnea   . Unspecified venous (peripheral) insufficiency   . Personal history of gallstones   . Personal history of diabetic foot ulcer     saw wound center, resolved 05/08/2010    Past Surgical History  Procedure Laterality Date  . Cardiac catheterization  1998    Negative  . Thrombosed vein  1993    Right leg  . Kidney stone surgery  04/1993  . Retinal detachment surgery  2002-2003  . Rca stents  04/2003  EF 55%  . Coronary artery bypass graft  09/2005    Post op AFIB  . Insertion prostate radiation seed  2009    RT and seeds for prostate cancer    Family History  Problem Relation Age of Onset  . Lung cancer Father   . Multiple sclerosis Mother   . Heart attack      paternal aunts and uncles    History   Social History  . Marital Status: Widowed    Spouse Name: N/A    Number of Children: 3  . Years of Education: N/A   Occupational History  . Heavy equipment mechanic--retired    Social History Main Topics  . Smoking status: Former Smoker    Types: Cigars    Quit date: 01/07/2001  . Smokeless tobacco: Never Used  . Alcohol Use: Yes     Comment: rare  . Drug Use: No  . Sexual Activity: Yes    Partners: Female   Other Topics Concern  . Not on file   Social History Narrative   Regular exercise:  seldom   Caffeine use: coffee daily   Review of Systems Trying to keep his foot up No fever    Objective:   Physical Exam  Constitutional: He appears well-developed and well-nourished. No distress.  Skin:  Still with ulcer under left great toe Some early granulation Redness and warmth in digit to MTP and slightly beyond           Assessment & Plan:

## 2012-12-23 NOTE — Progress Notes (Signed)
Pre-visit discussion using our clinic review tool. No additional management support is needed unless otherwise documented below in the visit note.  

## 2012-12-30 ENCOUNTER — Encounter: Payer: Self-pay | Admitting: Internal Medicine

## 2012-12-30 ENCOUNTER — Inpatient Hospital Stay (HOSPITAL_COMMUNITY): Payer: No Typology Code available for payment source | Admitting: Anesthesiology

## 2012-12-30 ENCOUNTER — Encounter (HOSPITAL_COMMUNITY): Payer: Self-pay

## 2012-12-30 ENCOUNTER — Encounter (HOSPITAL_COMMUNITY)
Admission: AD | Disposition: A | Discharge status: Home / Self Care | Payer: Self-pay | Source: Ambulatory Visit | Attending: Internal Medicine

## 2012-12-30 ENCOUNTER — Inpatient Hospital Stay (HOSPITAL_COMMUNITY): Payer: No Typology Code available for payment source

## 2012-12-30 ENCOUNTER — Ambulatory Visit (INDEPENDENT_AMBULATORY_CARE_PROVIDER_SITE_OTHER): Payer: No Typology Code available for payment source | Admitting: Internal Medicine

## 2012-12-30 ENCOUNTER — Inpatient Hospital Stay (HOSPITAL_COMMUNITY)
Admission: AD | Admit: 2012-12-30 | Discharge: 2013-01-03 | DRG: 617 | Disposition: A | Discharge status: Home / Self Care | Payer: No Typology Code available for payment source | Source: Ambulatory Visit | Attending: Internal Medicine | Admitting: Internal Medicine

## 2012-12-30 ENCOUNTER — Encounter (HOSPITAL_COMMUNITY): Payer: No Typology Code available for payment source | Admitting: Anesthesiology

## 2012-12-30 VITALS — BP 160/80 | HR 95 | Temp 98.3°F | Wt 290.0 lb

## 2012-12-30 DIAGNOSIS — L089 Local infection of the skin and subcutaneous tissue, unspecified: Secondary | ICD-10-CM | POA: Diagnosis present

## 2012-12-30 DIAGNOSIS — L97509 Non-pressure chronic ulcer of other part of unspecified foot with unspecified severity: Secondary | ICD-10-CM | POA: Diagnosis present

## 2012-12-30 DIAGNOSIS — E1169 Type 2 diabetes mellitus with other specified complication: Principal | ICD-10-CM | POA: Diagnosis present

## 2012-12-30 DIAGNOSIS — M9080 Osteopathy in diseases classified elsewhere, unspecified site: Secondary | ICD-10-CM | POA: Diagnosis present

## 2012-12-30 DIAGNOSIS — Z0000 Encounter for general adult medical examination without abnormal findings: Secondary | ICD-10-CM

## 2012-12-30 DIAGNOSIS — E11628 Type 2 diabetes mellitus with other skin complications: Secondary | ICD-10-CM

## 2012-12-30 DIAGNOSIS — M86272 Subacute osteomyelitis, left ankle and foot: Secondary | ICD-10-CM | POA: Diagnosis present

## 2012-12-30 DIAGNOSIS — IMO0002 Reserved for concepts with insufficient information to code with codable children: Secondary | ICD-10-CM | POA: Diagnosis present

## 2012-12-30 DIAGNOSIS — K648 Other hemorrhoids: Secondary | ICD-10-CM

## 2012-12-30 DIAGNOSIS — E11621 Type 2 diabetes mellitus with foot ulcer: Secondary | ICD-10-CM

## 2012-12-30 DIAGNOSIS — M159 Polyosteoarthritis, unspecified: Secondary | ICD-10-CM

## 2012-12-30 DIAGNOSIS — L02619 Cutaneous abscess of unspecified foot: Secondary | ICD-10-CM

## 2012-12-30 DIAGNOSIS — Z6838 Body mass index (BMI) 38.0-38.9, adult: Secondary | ICD-10-CM

## 2012-12-30 DIAGNOSIS — I2510 Atherosclerotic heart disease of native coronary artery without angina pectoris: Secondary | ICD-10-CM | POA: Diagnosis present

## 2012-12-30 DIAGNOSIS — Z951 Presence of aortocoronary bypass graft: Secondary | ICD-10-CM

## 2012-12-30 DIAGNOSIS — G589 Mononeuropathy, unspecified: Secondary | ICD-10-CM

## 2012-12-30 DIAGNOSIS — Z8249 Family history of ischemic heart disease and other diseases of the circulatory system: Secondary | ICD-10-CM

## 2012-12-30 DIAGNOSIS — N529 Male erectile dysfunction, unspecified: Secondary | ICD-10-CM

## 2012-12-30 DIAGNOSIS — Z8546 Personal history of malignant neoplasm of prostate: Secondary | ICD-10-CM

## 2012-12-30 DIAGNOSIS — I10 Essential (primary) hypertension: Secondary | ICD-10-CM | POA: Diagnosis present

## 2012-12-30 DIAGNOSIS — Z860100 Personal history of colon polyps, unspecified: Secondary | ICD-10-CM

## 2012-12-30 DIAGNOSIS — Z801 Family history of malignant neoplasm of trachea, bronchus and lung: Secondary | ICD-10-CM

## 2012-12-30 DIAGNOSIS — G4730 Sleep apnea, unspecified: Secondary | ICD-10-CM | POA: Diagnosis present

## 2012-12-30 DIAGNOSIS — H409 Unspecified glaucoma: Secondary | ICD-10-CM | POA: Diagnosis present

## 2012-12-30 DIAGNOSIS — I739 Peripheral vascular disease, unspecified: Secondary | ICD-10-CM | POA: Diagnosis present

## 2012-12-30 DIAGNOSIS — G4733 Obstructive sleep apnea (adult) (pediatric): Secondary | ICD-10-CM | POA: Diagnosis present

## 2012-12-30 DIAGNOSIS — E1349 Other specified diabetes mellitus with other diabetic neurological complication: Secondary | ICD-10-CM | POA: Diagnosis present

## 2012-12-30 DIAGNOSIS — Z79899 Other long term (current) drug therapy: Secondary | ICD-10-CM

## 2012-12-30 DIAGNOSIS — E1365 Other specified diabetes mellitus with hyperglycemia: Secondary | ICD-10-CM | POA: Diagnosis present

## 2012-12-30 DIAGNOSIS — E669 Obesity, unspecified: Secondary | ICD-10-CM | POA: Diagnosis present

## 2012-12-30 DIAGNOSIS — I872 Venous insufficiency (chronic) (peripheral): Secondary | ICD-10-CM

## 2012-12-30 DIAGNOSIS — Z794 Long term (current) use of insulin: Secondary | ICD-10-CM

## 2012-12-30 DIAGNOSIS — E1149 Type 2 diabetes mellitus with other diabetic neurological complication: Secondary | ICD-10-CM | POA: Diagnosis present

## 2012-12-30 DIAGNOSIS — Z66 Do not resuscitate: Secondary | ICD-10-CM | POA: Diagnosis present

## 2012-12-30 DIAGNOSIS — M869 Osteomyelitis, unspecified: Secondary | ICD-10-CM | POA: Diagnosis present

## 2012-12-30 DIAGNOSIS — C61 Malignant neoplasm of prostate: Secondary | ICD-10-CM

## 2012-12-30 DIAGNOSIS — E785 Hyperlipidemia, unspecified: Secondary | ICD-10-CM | POA: Diagnosis present

## 2012-12-30 DIAGNOSIS — E1142 Type 2 diabetes mellitus with diabetic polyneuropathy: Secondary | ICD-10-CM | POA: Diagnosis present

## 2012-12-30 HISTORY — PX: AMPUTATION: SHX166

## 2012-12-30 LAB — GLUCOSE, CAPILLARY

## 2012-12-30 LAB — BASIC METABOLIC PANEL
BUN: 19 mg/dL (ref 6–23)
CO2: 23 mEq/L (ref 19–32)
Chloride: 97 mEq/L (ref 96–112)
Creatinine, Ser: 0.9 mg/dL (ref 0.50–1.35)
GFR calc Af Amer: >90 mL/min (ref >90)
GFR calc non Af Amer: 88 mL/min — ABNORMAL LOW (ref >90)
Glucose, Bld: 227 mg/dL — ABNORMAL HIGH (ref 70–99)

## 2012-12-30 LAB — HEMOGLOBIN A1C: Mean Plasma Glucose: 171 mg/dL — ABNORMAL HIGH (ref <117)

## 2012-12-30 LAB — CBC
HCT: 34.2 % — ABNORMAL LOW (ref 39.0–52.0)
MCH: 29.4 pg (ref 26.0–34.0)
MCHC: 34.5 g/dL (ref 30.0–36.0)
MCV: 85.1 fL (ref 78.0–100.0)
Platelets: 217 10*3/uL (ref 150–400)
RDW: 15.8 % — ABNORMAL HIGH (ref 11.5–15.5)
WBC: 9.9 10*3/uL (ref 4.0–10.5)

## 2012-12-30 SURGERY — AMPUTATION, FOOT, RAY
Anesthesia: Monitor Anesthesia Care | Laterality: Left

## 2012-12-30 MED ORDER — ENOXAPARIN SODIUM 40 MG/0.4ML ~~LOC~~ SOLN
40.0000 mg | Freq: Every day | SUBCUTANEOUS | Status: DC
  Administered 2012-12-30: 40 mg via SUBCUTANEOUS
  Filled 2012-12-30: qty 0.4

## 2012-12-30 MED ORDER — ASPIRIN 325 MG PO TABS
325.0000 mg | ORAL_TABLET | Freq: Every day | ORAL | Status: DC
  Filled 2012-12-30: qty 1

## 2012-12-30 MED ORDER — HYDROCHLOROTHIAZIDE 12.5 MG PO CAPS
12.5000 mg | ORAL_CAPSULE | Freq: Every day | ORAL | Status: DC
  Administered 2012-12-31 – 2013-01-03 (×4): 12.5 mg via ORAL
  Filled 2012-12-30 (×5): qty 1

## 2012-12-30 MED ORDER — ONDANSETRON HCL 4 MG/2ML IJ SOLN: 4.0000 mg | Freq: Four times a day (QID) | INTRAMUSCULAR | Status: DC | PRN

## 2012-12-30 MED ORDER — ACETAMINOPHEN 650 MG RE SUPP: 650.0000 mg | Freq: Four times a day (QID) | RECTAL | Status: DC | PRN

## 2012-12-30 MED ORDER — METFORMIN HCL 500 MG PO TABS
1000.0000 mg | ORAL_TABLET | Freq: Two times a day (BID) | ORAL | Status: DC
  Administered 2012-12-31 – 2013-01-03 (×7): 1000 mg via ORAL
  Filled 2012-12-30 (×10): qty 2

## 2012-12-30 MED ORDER — SODIUM CHLORIDE 0.9 % IV SOLN
INTRAVENOUS | Status: DC
  Administered 2012-12-30: 12:00:00 via INTRAVENOUS

## 2012-12-30 MED ORDER — PROPOFOL 10 MG/ML IV BOLUS
INTRAVENOUS | Status: AC
  Filled 2012-12-30: qty 20

## 2012-12-30 MED ORDER — HYDROMORPHONE HCL PF 1 MG/ML IJ SOLN: 0.2500 mg | INTRAMUSCULAR | Status: DC | PRN

## 2012-12-30 MED ORDER — MIDAZOLAM HCL 5 MG/5ML IJ SOLN
INTRAMUSCULAR | Status: DC | PRN
  Administered 2012-12-30 (×2): 2 mg via INTRAVENOUS

## 2012-12-30 MED ORDER — PROPOFOL INFUSION 10 MG/ML OPTIME
INTRAVENOUS | Status: DC | PRN
  Administered 2012-12-30: 75 ug/kg/min via INTRAVENOUS

## 2012-12-30 MED ORDER — ADULT MULTIVITAMIN W/MINERALS CH
1.0000 | ORAL_TABLET | Freq: Every day | ORAL | Status: DC
  Administered 2013-01-01 – 2013-01-03 (×3): 1 via ORAL
  Filled 2012-12-30 (×5): qty 1

## 2012-12-30 MED ORDER — LACTATED RINGERS IV SOLN
INTRAVENOUS | Status: DC | PRN
  Administered 2012-12-30: 22:00:00 via INTRAVENOUS

## 2012-12-30 MED ORDER — MIDAZOLAM HCL 2 MG/2ML IJ SOLN
INTRAMUSCULAR | Status: AC
  Filled 2012-12-30: qty 2

## 2012-12-30 MED ORDER — ONDANSETRON HCL 4 MG PO TABS: 4.0000 mg | ORAL_TABLET | Freq: Four times a day (QID) | ORAL | Status: DC | PRN

## 2012-12-30 MED ORDER — OXYCODONE HCL 5 MG PO TABS: 5.0000 mg | ORAL_TABLET | ORAL | Status: DC | PRN

## 2012-12-30 MED ORDER — INSULIN ASPART 100 UNIT/ML ~~LOC~~ SOLN: 0.0000 [IU] | Freq: Every day | SUBCUTANEOUS | Status: DC

## 2012-12-30 MED ORDER — THROMBIN 5000 UNITS EX SOLR
CUTANEOUS | Status: AC
  Filled 2012-12-30: qty 5000

## 2012-12-30 MED ORDER — BRIMONIDINE TARTRATE 0.15 % OP SOLN
1.0000 [drp] | Freq: Every day | OPHTHALMIC | Status: DC
  Administered 2012-12-30 – 2013-01-03 (×5): 1 [drp] via OPHTHALMIC
  Filled 2012-12-30: qty 5

## 2012-12-30 MED ORDER — FENTANYL CITRATE 0.05 MG/ML IJ SOLN
INTRAMUSCULAR | Status: DC | PRN
  Administered 2012-12-30: 100 ug via INTRAVENOUS

## 2012-12-30 MED ORDER — VANCOMYCIN HCL 10 G IV SOLR
2000.0000 mg | Freq: Once | INTRAVENOUS | Status: AC
  Administered 2012-12-30: 2000 mg via INTRAVENOUS
  Filled 2012-12-30: qty 2000

## 2012-12-30 MED ORDER — ROPIVACAINE HCL 5 MG/ML IJ SOLN
INTRAMUSCULAR | Status: AC
  Filled 2012-12-30: qty 30

## 2012-12-30 MED ORDER — INSULIN DETEMIR 100 UNIT/ML ~~LOC~~ SOLN
40.0000 [IU] | Freq: Every day | SUBCUTANEOUS | Status: DC
  Administered 2012-12-31 – 2013-01-02 (×3): 40 [IU] via SUBCUTANEOUS
  Filled 2012-12-30 (×5): qty 0.4

## 2012-12-30 MED ORDER — ROPIVACAINE HCL 5 MG/ML IJ SOLN
INTRAMUSCULAR | Status: DC | PRN
  Administered 2012-12-30: 30 mL

## 2012-12-30 MED ORDER — GLYCOPYRROLATE 0.2 MG/ML IJ SOLN
INTRAMUSCULAR | Status: AC
  Filled 2012-12-30: qty 1

## 2012-12-30 MED ORDER — CARVEDILOL 25 MG PO TABS
25.0000 mg | ORAL_TABLET | Freq: Two times a day (BID) | ORAL | Status: DC
  Administered 2012-12-30 – 2013-01-03 (×8): 25 mg via ORAL
  Filled 2012-12-30 (×11): qty 1

## 2012-12-30 MED ORDER — FENTANYL CITRATE 0.05 MG/ML IJ SOLN
INTRAMUSCULAR | Status: AC
  Filled 2012-12-30: qty 2

## 2012-12-30 MED ORDER — ACETAMINOPHEN 325 MG PO TABS: 650.0000 mg | ORAL_TABLET | Freq: Four times a day (QID) | ORAL | Status: DC | PRN

## 2012-12-30 MED ORDER — METOCLOPRAMIDE HCL 5 MG PO TABS
5.0000 mg | ORAL_TABLET | Freq: Three times a day (TID) | ORAL | Status: DC | PRN
  Filled 2012-12-30: qty 2

## 2012-12-30 MED ORDER — LOSARTAN POTASSIUM-HCTZ 50-12.5 MG PO TABS: 1.0000 | ORAL_TABLET | Freq: Every day | ORAL | Status: DC

## 2012-12-30 MED ORDER — THROMBIN 20000 UNITS EX KIT
20000.0000 [IU] | PACK | Freq: Once | CUTANEOUS | Status: AC
  Administered 2012-12-30: 20000 [IU] via TOPICAL
  Filled 2012-12-30: qty 1

## 2012-12-30 MED ORDER — INSULIN ASPART 100 UNIT/ML ~~LOC~~ SOLN
0.0000 [IU] | Freq: Three times a day (TID) | SUBCUTANEOUS | Status: DC
  Administered 2012-12-30: 2 [IU] via SUBCUTANEOUS
  Administered 2012-12-30: 3 [IU] via SUBCUTANEOUS
  Administered 2012-12-31 (×2): 5 [IU] via SUBCUTANEOUS
  Administered 2012-12-31: 2 [IU] via SUBCUTANEOUS
  Administered 2013-01-01: 12:00:00 via SUBCUTANEOUS
  Administered 2013-01-01 (×2): 3 [IU] via SUBCUTANEOUS
  Administered 2013-01-02: 2 [IU] via SUBCUTANEOUS
  Administered 2013-01-02 (×2): 3 [IU] via SUBCUTANEOUS
  Administered 2013-01-03: 2 [IU] via SUBCUTANEOUS

## 2012-12-30 MED ORDER — METOCLOPRAMIDE HCL 5 MG/ML IJ SOLN: 5.0000 mg | Freq: Three times a day (TID) | INTRAMUSCULAR | Status: DC | PRN

## 2012-12-30 MED ORDER — PIPERACILLIN-TAZOBACTAM 3.375 G IVPB
3.3750 g | Freq: Three times a day (TID) | INTRAVENOUS | Status: DC
  Administered 2012-12-30 – 2013-01-03 (×12): 3.375 g via INTRAVENOUS
  Filled 2012-12-30 (×16): qty 50

## 2012-12-30 MED ORDER — VANCOMYCIN HCL 10 G IV SOLR
1250.0000 mg | Freq: Two times a day (BID) | INTRAVENOUS | Status: DC
  Administered 2012-12-31 – 2013-01-02 (×4): 1250 mg via INTRAVENOUS
  Filled 2012-12-30 (×6): qty 1250

## 2012-12-30 MED ORDER — LOSARTAN POTASSIUM 50 MG PO TABS
50.0000 mg | ORAL_TABLET | Freq: Every day | ORAL | Status: DC
  Administered 2012-12-31 – 2013-01-03 (×4): 50 mg via ORAL
  Filled 2012-12-30 (×5): qty 1

## 2012-12-30 MED ORDER — MORPHINE SULFATE 2 MG/ML IJ SOLN
1.0000 mg | INTRAMUSCULAR | Status: DC | PRN
Start: 2012-12-30 — End: 2013-01-03

## 2012-12-30 SURGICAL SUPPLY — 35 items
BAG ZIPLOCK 12X15 (MISCELLANEOUS) ×2 IMPLANT
BANDAGE ELASTIC 6 VELCRO ST LF (GAUZE/BANDAGES/DRESSINGS) ×2 IMPLANT
BLADE MIC 41X13 (BLADE) ×4 IMPLANT
BNDG COHESIVE 4X5 TAN STRL (GAUZE/BANDAGES/DRESSINGS) ×2 IMPLANT
BNDG ESMARK 4X9 LF (GAUZE/BANDAGES/DRESSINGS) ×2 IMPLANT
DRAPE SURG 17X11 SM STRL (DRAPES) ×4 IMPLANT
DRAPE U-SHAPE 47X51 STRL (DRAPES) ×2 IMPLANT
DRSG ADAPTIC 3X8 NADH LF (GAUZE/BANDAGES/DRESSINGS) ×2 IMPLANT
DRSG PAD ABDOMINAL 8X10 ST (GAUZE/BANDAGES/DRESSINGS) ×2 IMPLANT
DURAPREP 26ML APPLICATOR (WOUND CARE) ×2 IMPLANT
ELECT REM PT RETURN 9FT ADLT (ELECTROSURGICAL) ×2
ELECTRODE REM PT RTRN 9FT ADLT (ELECTROSURGICAL) ×1 IMPLANT
GAUZE SPONGE 4X4 16PLY XRAY LF (GAUZE/BANDAGES/DRESSINGS) ×2 IMPLANT
GAUZE XEROFORM 1X8 LF (GAUZE/BANDAGES/DRESSINGS) ×2 IMPLANT
GLOVE SURG ORTHO 8.0 STRL STRW (GLOVE) ×2 IMPLANT
GOWN PREVENTION PLUS LG XLONG (DISPOSABLE) ×2 IMPLANT
HANDPIECE INTERPULSE COAX TIP (DISPOSABLE)
KIT BASIN OR (CUSTOM PROCEDURE TRAY) ×2 IMPLANT
NEEDLE HYPO 22GX1.5 SAFETY (NEEDLE) ×2 IMPLANT
NS IRRIG 1000ML POUR BTL (IV SOLUTION) ×2 IMPLANT
PACK LOWER EXTREMITY WL (CUSTOM PROCEDURE TRAY) ×2 IMPLANT
PADDING CAST COTTON 6X4 STRL (CAST SUPPLIES) ×2 IMPLANT
PADDING CAST SYNTHETIC 4 (CAST SUPPLIES) ×1
PADDING CAST SYNTHETIC 4X4 STR (CAST SUPPLIES) ×1 IMPLANT
POSITIONER SURGICAL ARM (MISCELLANEOUS) ×2 IMPLANT
SET HNDPC FAN SPRY TIP SCT (DISPOSABLE) IMPLANT
SPONGE GAUZE 4X4 12PLY (GAUZE/BANDAGES/DRESSINGS) ×2 IMPLANT
SUCTION FRAZIER TIP 10 FR DISP (SUCTIONS) ×2 IMPLANT
SUT ETHILON 2 0 PS N (SUTURE) ×2 IMPLANT
SUT ETHILON 2 0 PSLX (SUTURE) ×2 IMPLANT
SUT VIC AB 2-0 CT1 27 (SUTURE) ×1
SUT VIC AB 2-0 CT1 TAPERPNT 27 (SUTURE) ×1 IMPLANT
SYR CONTROL 10ML LL (SYRINGE) ×2 IMPLANT
TOWEL OR 17X26 10 PK STRL BLUE (TOWEL DISPOSABLE) ×6 IMPLANT
WATER STERILE IRR 1500ML POUR (IV SOLUTION) ×2 IMPLANT

## 2012-12-30 NOTE — Brief Op Note (Signed)
12/30/2012  10:50 PM  PATIENT:  [name]  62 y.o. male  PRE-OPERATIVE DIAGNOSIS:  infection left great toe  POST-OPERATIVE DIAGNOSIS:  infection left great toe  PROCEDURE:  Procedure(s): AMPUTATION RAY  Left number 1  SURGEON:  Surgeon(s): Cammy Copa, MD  ASSISTANT:   ANESTHESIA:   regional  EBL: 5 ml       BLOOD ADMINISTERED: none  DRAINS: none   LOCAL MEDICATIONS USED:  none  SPECIMEN:  Toe to path  COUNTS:  YES  TOURNIQUET:    DICTATION: .Other Dictation: Dictation Number [id number]  PLAN OF CARE: Admit to inpatient   PATIENT DISPOSITION:  PACU - hemodynamically stable

## 2012-12-30 NOTE — Anesthesia Procedure Notes (Signed)
Anesthesia Regional Block:  Ankle block  Pre-Anesthetic Checklist: ,, timeout performed, Correct Patient, Correct Site, Correct Laterality, Correct Procedure, Correct Position, site marked, Risks and benefits discussed, Surgical consent,  Pre-op evaluation,  Post-op pain management  Laterality: Left  Prep: chloraprep       Needles:  Injection technique: Single-shot  Needle Type: Other      Needle Gauge: 22 and 22 G    Additional Needles:  Procedures: other  (Landmarks) Ankle block Narrative:  Start time: 12/30/2012 9:15 PM End time: 12/30/2012 9:25 PM Injection made incrementally with aspirations every 5 mL.  Performed by: Personally  Anesthesiologist: Renold Don

## 2012-12-30 NOTE — Anesthesia Preprocedure Evaluation (Addendum)
Anesthesia Evaluation  Patient identified by MRN, date of birth, ID band Patient awake    Reviewed: Allergy & Precautions, H&P , NPO status , Patient's Chart, lab work & pertinent test results  Airway Mallampati: II TM Distance: >3 FB Neck ROM: Full    Dental  (+) Dental Advisory Given   Pulmonary sleep apnea , former smoker,  breath sounds clear to auscultation        Cardiovascular + CAD and + Peripheral Vascular Disease Rhythm:Regular Rate:Normal     Neuro/Psych PSYCHIATRIC DISORDERS  Neuromuscular disease    GI/Hepatic negative GI ROS, Neg liver ROS,   Endo/Other  diabetes, Type 2, Oral Hypoglycemic Agents and Insulin Dependent  Renal/GU negative Renal ROS     Musculoskeletal negative musculoskeletal ROS (+)   Abdominal (+) + obese,   Peds  Hematology negative hematology ROS (+)   Anesthesia Other Findings   Reproductive/Obstetrics                          Anesthesia Physical Anesthesia Plan  ASA: III  Anesthesia Plan: Regional and MAC   Post-op Pain Management: MAC Combined w/ Regional for Post-op pain   Induction:   Airway Management Planned: Natural Airway and Simple Face Mask  Additional Equipment:   Intra-op Plan:   Post-operative Plan: Extubation in OR  Informed Consent: I have reviewed the patients History and Physical, chart, labs and discussed the procedure including the risks, benefits and alternatives for the proposed anesthesia with the patient or authorized representative who has indicated his/her understanding and acceptance.   Dental advisory given  Plan Discussed with: CRNA  Anesthesia Plan Comments:         Anesthesia Quick Evaluation

## 2012-12-30 NOTE — Assessment & Plan Note (Addendum)
Markedly worse Hasn't responded to the specific Rx for culture results---likely polymicrobial High chance of osteomyelitis at this point  Will arrange for direct admit to hospital Will need parenteral antibiotics Surgical consult  Discussed with Dr Rito Ehrlich and he will take him as direct full admission to regular bed

## 2012-12-30 NOTE — Transfer of Care (Signed)
Immediate Anesthesia Transfer of Care Note  Patient: [name]  Procedure(s) Performed: Procedure(s) with comments: AMPUTATION RAY  (Left) - LEFT GREAT TOE RAY AMPUTATION  Patient Location: PACU  Anesthesia Type:MAC  With ankle block  Level of Consciousness: awake, alert , oriented and patient cooperative  Airway & Oxygen Therapy: Patient Spontanous Breathing and Patient connected to face mask oxygen  Post-op Assessment: Report given to PACU RN, Post -op Vital signs reviewed and stable and Patient moving all extremities X 4  Post vital signs: stable  Complications: No apparent anesthesia complications

## 2012-12-30 NOTE — Progress Notes (Signed)
Utilization review completed.  

## 2012-12-30 NOTE — Progress Notes (Signed)
Patient ID: [name], male   DOB: 1950/06/28, 62 y.o.   MRN: [id number] Called by Pearlie Oyster for direct admission.  Pt seen last week with diabetic foot ulcer & cultures sensitive to cipro.  Followup today, pt looks worse.  VSS. Needs IV abx & possible surgery consultation.  Also, xrays or MRI to r/o osteo.  Pt to come in as full inpatient to med/surg bed.

## 2012-12-30 NOTE — Op Note (Signed)
NAMEARVEL, OQUINN                 ACCOUNT NO.:  [id number]  MEDICAL RECORD NO.:  [id number]  LOCATION:  [facility]                         FACILITY:  [facility]  PHYSICIAN:  Burnard Bunting, M.D.    DATE OF BIRTH:  1950/05/05  DATE OF PROCEDURE: DATE OF DISCHARGE:                              OPERATIVE REPORT   PREOPERATIVE DIAGNOSIS:  Left great toe infection.  POSTOPERATIVE DIAGNOSIS:  Left great toe ray infection.  PROCEDURE:  Left first great toe ray amputation.  SURGEON:  Burnard Bunting, M.D.  ASSISTANT:  None.  ANESTHESIA:  Ankle block.  INDICATIONS:  [name] is a patient with left great toe infection. The patient has osteomyelitis by x-ray and significant soft tissue crepitus involving the great toe at the MTP joint and slightly proximal. He presents now for operative management after explanation of risks and benefits.  PROCEDURE IN DETAIL:  The patient was brought to the operating room where regional anesthetic was induced.  Time-out was called.  Left foot was prepped with Hibiclens saline, draped in sterile manner.  The patient's great toe was swollen, had tissue crepitus ulceration on the medial aspect near the IP joint, and erythema, and general devitalized tissue just proximal to the MTP joint.  Ankle Esmarch was utilized for approximately 10 minutes.  An incision was made along the anteromedial border of the first ray extending elliptically around the MTP joint through uninfected appearing tissue.  The skin and subcu tissue was divided.  Toe was amputated initially through the MTP joint.  This would not allow for closure of the skin and in order to achieve skin closure and to remove all devitalized appearing and infection infected skin tissue ray amputation was performed.  This was done obliquely with a saw through the first ray.  Following this, specimens were sent to pathology.  The incision was then irrigated with 3 L of irrigating solution.  Because of the  patient's preop Lovenox and aspirin use thrombin was applied.  There was still bleeding of the tissue when the ankle Esmarch was released.  The incision was then closed using 2-0 Vicryl and 3-0 nylon suture.  Tension-free closure was achieved.  Skin edges looked reasonably perfused.  The second toe had a slightly diminished perfusion.  It should be noted that prior to the amputation, the patient was noted to have Dopplerable, but not palpable pedal pulses.  This is likely due to the significant amount of swelling in the foot.  Good closure was achieved and bulky dressing was applied.  The patient tolerated the procedure well without immediate complication.  Transferred to recovery room in stable condition.     Burnard Bunting, M.D.     GSD/MEDQ  D:  12/30/2012  T:  12/30/2012  Job:  [id number]

## 2012-12-30 NOTE — Consult Note (Signed)
Reason for Consult:left toe infectionf Referring Physician: Dr Nanci Pina is an 62 y.o. male.  HPI: [name] is a 63 year old male with 2-3 week history of left great toe pain swelling and redness. He's been treated with several courses of oral antibiotics and local wound care. The toe is worsening and he presents now for further management. He denies any fever chills. Denies any trauma to the left toe. He last ate lunch at 12:00 and had 40 mg of Lovenox subcutaneous at 11:00. He does take 1 aspirin a day. No other history of T. or pulmonary embolism; however, he does have a history of CABG in 2006 and prostate cancer for which he is 5 years cancer free currently.  Past Medical History  Diagnosis Date  . Malignant neoplasm of prostate   . Coronary atherosclerosis of unspecified type of vessel, native or graft   . Type II or unspecified type diabetes mellitus with neurological manifestations, not stated as uncontrolled(250.60)   . Impotence of organic origin   . Unspecified glaucoma(365.9)   . Internal hemorrhoids without mention of complication   . Other and unspecified hyperlipidemia   . Mononeuritis of unspecified site   . Osteoarthrosis, unspecified whether generalized or localized, unspecified site   . Personal history of colonic polyps   . Routine general medical examination at a health care facility   . Hemorrhage of rectum and anus   . Unspecified sleep apnea   . Unspecified venous (peripheral) insufficiency   . Personal history of gallstones   . Personal history of diabetic foot ulcer     saw wound center, resolved 05/08/2010    Past Surgical History  Procedure Laterality Date  . Cardiac catheterization  1998    Negative  . Thrombosed vein  1993    Right leg  . Kidney stone surgery  04/1993  . Retinal detachment surgery  2002-2003  . Rca stents  04/2003    EF 55%  . Coronary artery bypass graft  09/2005    Post op AFIB  . Insertion prostate radiation seed  2009     RT and seeds for prostate cancer    Family History  Problem Relation Age of Onset  . Lung cancer Father   . Multiple sclerosis Mother   . Heart attack      paternal aunts and uncles    Social History:  reports that he quit smoking about 11 years ago. His smoking use included Cigars. He has never used smokeless tobacco. He reports that he drinks alcohol. He reports that he does not use illicit drugs.  Allergies:  Allergies  Allergen Reactions  . Atorvastatin     REACTION: myalgias  . Ezetimibe Other (See Comments)    Body ache  . Simvastatin Other (See Comments)    Body ache    Medications: I have reviewed the patient's current medications.  Results for orders placed during the hospital encounter of 12/30/12 (from the past 48 hour(s))  BASIC METABOLIC PANEL     Status: Abnormal   Collection Time    12/30/12 11:00 AM      Result Value Range   Sodium 134 (*) 135 - 145 mEq/L   Potassium 4.4  3.5 - 5.1 mEq/L   Chloride 97  96 - 112 mEq/L   CO2 23  19 - 32 mEq/L   Glucose, Bld 227 (*) 70 - 99 mg/dL   BUN 19  6 - 23 mg/dL   Creatinine, Ser 3.08  0.50 -  1.35 mg/dL   Calcium 9.5  8.4 - 16.1 mg/dL   GFR calc non Af Amer 88 (*) >90 mL/min   GFR calc Af Amer >90  >90 mL/min   Comment: (NOTE)     The eGFR has been calculated using the CKD EPI equation.     This calculation has not been validated in all clinical situations.     eGFR's persistently <90 mL/min signify possible Chronic Kidney     Disease.  CBC     Status: Abnormal   Collection Time    12/30/12 11:00 AM      Result Value Range   WBC 9.9  4.0 - 10.5 K/uL   RBC 4.02 (*) 4.22 - 5.81 MIL/uL   Hemoglobin 11.8 (*) 13.0 - 17.0 g/dL   HCT 09.6 (*) 04.5 - 40.9 %   MCV 85.1  78.0 - 100.0 fL   MCH 29.4  26.0 - 34.0 pg   MCHC 34.5  30.0 - 36.0 g/dL   RDW 81.1 (*) 91.4 - 78.2 %   Platelets 217  150 - 400 K/uL  HEMOGLOBIN A1C     Status: Abnormal   Collection Time    12/30/12 11:00 AM      Result Value Range    Hemoglobin A1C 7.6 (*) <5.7 %   Comment: (NOTE)                                                                               According to the ADA Clinical Practice Recommendations for 2011, when     HbA1c is used as a screening test:      >=6.5%   Diagnostic of Diabetes Mellitus               (if abnormal result is confirmed)     5.7-6.4%   Increased risk of developing Diabetes Mellitus     References:Diagnosis and Classification of Diabetes Mellitus,Diabetes     Care,2011,34(Suppl 1):S62-S69 and Standards of Medical Care in             Diabetes - 2011,Diabetes Care,2011,34 (Suppl 1):S11-S61.   Mean Plasma Glucose 171 (*) <117 mg/dL   Comment: Performed at [facility]  GLUCOSE, CAPILLARY     Status: Abnormal   Collection Time    12/30/12 11:30 AM      Result Value Range   Glucose-Capillary 184 (*) 70 - 99 mg/dL   Comment 1 Notify RN      Dg Foot Complete Left  12/30/2012   CLINICAL DATA:  Diabetic foot ulcer  EXAM: LEFT FOOT - COMPLETE 3+ VIEW  COMPARISON:  None.  FINDINGS: Frontal, oblique, and lateral views were obtained. There is bony destruction involving the proximal aspect of the 1st distal phalanx. No other bony destruction is seen. There is no fracture or dislocation. There is mild narrowing of all PIP and DIP joints. There are spurs arising from the posterior and inferior calcaneus.  IMPRESSION: Osteomyelitis involving the proximal aspect of the 1st distal phalanx.  Critical Value/emergent results were called by telephone at the time of interpretation on 12/30/2012 at 2:04 PM to Philomena Doheny, RN, who verbally acknowledged these results.   Electronically Signed  By: Bretta Bang M.D.   On: 12/30/2012 14:05    Review of Systems  Constitutional: Negative.   HENT: Negative.   Eyes: Negative.   Respiratory: Negative.   Cardiovascular: Negative.   Gastrointestinal: Negative.   Genitourinary: Negative.   Musculoskeletal: Negative.   Skin: Negative.   Neurological:  Negative.   Endo/Heme/Allergies: Negative.   Psychiatric/Behavioral: Negative.    examination of the left foot demonstrates redness and erythema extending slightly proximal to the MTP joint. There is ulceration and wet gangrene of the great toe on the left. There is some tissue crepitus in the great toe but not in the forefoot. Pedal pulses not critically palpable on the left foot but they are palpable on the right foot. There is some swelling in the forefoot which could explain this. The toes and cells are perfused. (The patient did state that he had a basilar study a year ago which showed perfusion to the feet) Blood pressure 143/76, pulse 93, temperature 98.5 F (36.9 C), temperature source Oral, resp. rate 18, height 6\' 1"  (1.854 m), weight 131 kg (288 lb 12.8 oz), SpO2 96.00%. Physical Exam  Constitutional: He appears well-developed.  HENT:  Head: Normocephalic.  Eyes: Pupils are equal, round, and reactive to light.  Neck: Normal range of motion.  Cardiovascular: Normal rate.   Respiratory: Effort normal.  Neurological: He is alert.  Skin: Skin is warm.  Psychiatric: He has a normal mood and affect.    Assessment/Plan: Impression is left great toe os myelitis by plain radiographs. Also myelitis is involving the distal tuft. More concerning is some of the tissue crepitus present in the left great toe. The erythema is extending proximal to the MTP joint. Plan this time is for rate dictation of the left great toe. The patient will need vascular study while he is here in the hospital. Half-life of Lovenox his 6 hours and thus will delay this until later tonight. Bleeding may be an issue with his aspirin use but the infection currently warrants prompt surgical intervention. Risk and benefits discussed with family including but limited to infection persistent, need for more surgery, he can thrombosis, altered gait after surgery, and bleeding.  [name] 12/30/2012, 5:01 PM

## 2012-12-30 NOTE — Progress Notes (Signed)
Subjective:    Patient ID: [name], male    DOB: 05-29-1950, 62 y.o.   MRN: [id number]  HPI Here with girlfriend again  Finished the bactrim and just on cipro Had improved but now worse Bad smell has returned Some chills and feels hot---gets clammy No measured fever Now with more pain  Current Outpatient Prescriptions on File Prior to Visit  Medication Sig Dispense Refill  . aspirin 325 MG tablet Take 325 mg by mouth daily.        . brimonidine (ALPHAGAN) 0.15 % ophthalmic solution Place 1 drop into both eyes daily.        . carvedilol (COREG) 25 MG tablet Take 1 tablet (25 mg total) by mouth 2 (two) times daily with a meal.  180 tablet  3  . glucose blood (FREESTYLE INSULINX TEST) test strip Test blood sugar 3 times daily as instructed. Dx code: 250.62  300 each  3  . insulin detemir (LEVEMIR) 100 UNIT/ML injection Inject 0.65 mLs (65 Units total) into the skin at bedtime.  20 mL  11  . insulin lispro (HUMALOG PEN) 100 UNIT/ML SOPN Inject up to 25 units into the skin before each of the 3 meals. Dx code: 250.60.  25 pen  3  . Insulin Pen Needle (FIFTY50 PEN NEEDLES) 31G X 5 MM MISC Use 3 times daily to inject insulin dx: 250.60  300 each  3  . losartan-hydrochlorothiazide (HYZAAR) 50-12.5 MG per tablet TAKE ONE BY MOUTH DAILY  90 tablet  3  . metFORMIN (GLUCOPHAGE) 1000 MG tablet TAKE 1 TABLET BY MOUTH TWICE A DAY  180 tablet  3  . Multiple Vitamin (MULTIVITAMIN) tablet Take 1 tablet by mouth daily.        Marland Kitchen NOVOLOG FLEXPEN 100 UNIT/ML SOPN FlexPen       . triamcinolone (KENALOG) 0.025 % cream APPLY 1 APPLICATION TOPICALLY 3 (THREE) TIMES DAILY AS NEEDED.  30 g  1   No current facility-administered medications on file prior to visit.    Allergies  Allergen Reactions  . Atorvastatin     REACTION: myalgias  . Ezetimibe   . Simvastatin     Past Medical History  Diagnosis Date  . Malignant neoplasm of prostate   . Coronary atherosclerosis of unspecified type of vessel,  native or graft   . Type II or unspecified type diabetes mellitus with neurological manifestations, not stated as uncontrolled(250.60)   . Impotence of organic origin   . Unspecified glaucoma(365.9)   . Internal hemorrhoids without mention of complication   . Other and unspecified hyperlipidemia   . Mononeuritis of unspecified site   . Osteoarthrosis, unspecified whether generalized or localized, unspecified site   . Personal history of colonic polyps   . Routine general medical examination at a health care facility   . Hemorrhage of rectum and anus   . Unspecified sleep apnea   . Unspecified venous (peripheral) insufficiency   . Personal history of gallstones   . Personal history of diabetic foot ulcer     saw wound center, resolved 05/08/2010    Past Surgical History  Procedure Laterality Date  . Cardiac catheterization  1998    Negative  . Thrombosed vein  1993    Right leg  . Kidney stone surgery  04/1993  . Retinal detachment surgery  2002-2003  . Rca stents  04/2003    EF 55%  . Coronary artery bypass graft  09/2005    Post op AFIB  .  Insertion prostate radiation seed  2009    RT and seeds for prostate cancer    Family History  Problem Relation Age of Onset  . Lung cancer Father   . Multiple sclerosis Mother   . Heart attack      paternal aunts and uncles    History   Social History  . Marital Status: Widowed    Spouse Name: N/A    Number of Children: 3  . Years of Education: N/A   Occupational History  . Heavy equipment mechanic--retired    Social History Main Topics  . Smoking status: Former Smoker    Types: Cigars    Quit date: 01/07/2001  . Smokeless tobacco: Never Used  . Alcohol Use: Yes     Comment: rare  . Drug Use: No  . Sexual Activity: Yes    Partners: Female   Other Topics Concern  . Not on file   Social History Narrative   Regular exercise: seldom   Caffeine use: coffee daily   Review of Systems No GI problems with the  antibiotic Appetite is okay     Objective:   Physical Exam  Musculoskeletal:  Right great toe with markedly more swelling, redness Erythema has spread past the toe into the foot           Assessment & Plan:

## 2012-12-30 NOTE — H&P (Signed)
[facility] History and Physical  [name] ZOX:[id number] DOB: Oct 12, 1950 DOA: 12/30/2012  Referring physician: Tillman Abide PCP: Tillman Abide, MD   Chief Complaint: Foot ulcer  HPI: [name] is a 62 y.o. male  Past medical history CAD and poorly controlled diabetes mellitus with secondary neuropathy who has been having some issues with a cellulitis and ulcer of the first toe on his left foot. Several weeks ago patient had wound cultures done on this ulcer and was started on Bactrim. With little improvement, it was changed over to Cipro. Initially the patient felt like his wound was slightly getting better but now, was worse. She also complained of generalized malaise and chills. He saw his PCP today who noted that the wound looks significantly worse and patient was made a direct admission.   Review of Systems:  Patient seen after arrival to floor. Doing okay. Denies any headaches or vision changes. Denies any dysphasia. Complains of dry mouth. Complains some occasional chills, but no fever. Denies any chest pain, palpitations, shortness of breath, wheeze, cough, abdominal pain, hematuria, dysuria, constipation, diarrhea, focal extremity numbness acutely weakness or pain. Patient claims of chronic lower, numbness from neuropathy. His review systems is a was negative.  Past Medical History  Diagnosis Date  . Malignant neoplasm of prostate   . Coronary atherosclerosis of unspecified type of vessel, native or graft   . Type II or unspecified type diabetes mellitus with neurological manifestations, not stated as uncontrolled(250.60)   . Impotence of organic origin   . Unspecified glaucoma(365.9)   . Internal hemorrhoids without mention of complication   . Other and unspecified hyperlipidemia   . Mononeuritis of unspecified site   . Osteoarthrosis, unspecified whether generalized or localized, unspecified site   . Personal history of colonic polyps   . Routine general  medical examination at a health care facility   . Hemorrhage of rectum and anus   . Unspecified sleep apnea   . Unspecified venous (peripheral) insufficiency   . Personal history of gallstones   . Personal history of diabetic foot ulcer     saw wound center, resolved 05/08/2010   Past Surgical History  Procedure Laterality Date  . Cardiac catheterization  1998    Negative  . Thrombosed vein  1993    Right leg  . Kidney stone surgery  04/1993  . Retinal detachment surgery  2002-2003  . Rca stents  04/2003    EF 55%  . Coronary artery bypass graft  09/2005    Post op AFIB  . Insertion prostate radiation seed  2009    RT and seeds for prostate cancer   Social History:  reports that he quit smoking about 11 years ago. His smoking use included Cigars. He has never used smokeless tobacco. He reports that he drinks alcohol. He reports that he does not use illicit drugs. patient lives at home by himself. He is normally able to get around without any assistance  Allergies  Allergen Reactions  . Atorvastatin     REACTION: myalgias  . Ezetimibe Other (See Comments)    Body ache  . Simvastatin Other (See Comments)    Body ache    Family History  Problem Relation Age of Onset  . Lung cancer Father   . Multiple sclerosis Mother   . Heart attack      paternal aunts and uncles     Prior to Admission medications   Medication Sig Start Date End Date Taking? Authorizing Provider  aspirin 325 MG tablet Take 325 mg by mouth daily.     Yes Historical Provider, MD  brimonidine (ALPHAGAN) 0.15 % ophthalmic solution Place 1 drop into both eyes daily.     Yes Historical Provider, MD  carvedilol (COREG) 25 MG tablet Take 1 tablet (25 mg total) by mouth 2 (two) times daily with a meal. 03/19/12  Yes Karie Schwalbe, MD  insulin detemir (LEVEMIR) 100 UNIT/ML injection Inject 0.65 mLs (65 Units total) into the skin at bedtime. 11/02/12  Yes Carlus Pavlov, MD  insulin lispro (HUMALOG PEN) 100 UNIT/ML  SOPN Inject up to 25 units into the skin before each of the 3 meals. Dx code: 250.60. 11/26/12  Yes Karie Schwalbe, MD  losartan-hydrochlorothiazide [facility]) 50-12.5 MG per tablet Take 1 tablet by mouth daily.   Yes Historical Provider, MD  metFORMIN (GLUCOPHAGE) 1000 MG tablet Take 1,000 mg by mouth 2 (two) times daily with a meal.   Yes Historical Provider, MD  Multiple Vitamin (MULTIVITAMIN) tablet Take 1 tablet by mouth daily.     Yes Historical Provider, MD   Physical Exam: Filed Vitals:   12/30/12 1012  BP: 143/76  Pulse: 93  Temp: 98.5 F (36.9 C)  Resp: 18    BP 143/76  Pulse 93  Temp(Src) 98.5 F (36.9 C) (Oral)  Resp 18  Ht 6\' 1"  (1.854 m)  Wt 131 kg (288 lb 12.8 oz)  BMI 38.11 kg/m2  SpO2 96%  General:  Appears calm and comfortable Eyes: Sclera nonicteric, extraocular movements are intact ENT: grossly normal hearing, lips & tongue Neck: no LAD, masses or thyromegaly Cardiovascular: RRR, no m/r/g. No LE edema. Respiratory: CTA bilaterally, no w/r/r. Normal respiratory effort. Abdomen: soft, distended, obese, positive bowel sounds, nontender Skin: Patient's first toe on left foot significantly edematous with areas of ulceration and erythema. It is nontender. Musculoskeletal: grossly normal tone BUE/BLE Psychiatric: grossly normal mood and affect, speech fluent and appropriate Neurologic: grossly non-focal, other than signs of chronic peripheral lower chart he neuropathy           Labs on Admission:  Basic Metabolic Panel:  Recent Labs Lab 12/30/12 1100  NA 134*  K 4.4  CL 97  CO2 23  GLUCOSE 227*  BUN 19  CREATININE 0.90  CALCIUM 9.5   Liver Function Tests: No results found for this basename: AST, ALT, ALKPHOS, BILITOT, PROT, ALBUMIN,  in the last 168 hours No results found for this basename: LIPASE, AMYLASE,  in the last 168 hours No results found for this basename: AMMONIA,  in the last 168 hours CBC:  Recent Labs Lab 12/30/12 1100  WBC 9.9   HGB 11.8*  HCT 34.2*  MCV 85.1  PLT 217   Cardiac Enzymes: No results found for this basename: CKTOTAL, CKMB, CKMBINDEX, TROPONINI,  in the last 168 hours  BNP (last 3 results) No results found for this basename: PROBNP,  in the last 8760 hours CBG:  Recent Labs Lab 12/30/12 1130  GLUCAP 184*    Radiological Exams on Admission: Dg Foot Complete Left  12/30/2012   CLINICAL DATA:  Diabetic foot ulcer  EXAM: LEFT FOOT - COMPLETE 3+ VIEW  COMPARISON:  None.  FINDINGS: Frontal, oblique, and lateral views were obtained. There is bony destruction involving the proximal aspect of the 1st distal phalanx. No other bony destruction is seen. There is no fracture or dislocation. There is mild narrowing of all PIP and DIP joints. There are spurs arising from the posterior and  inferior calcaneus.  IMPRESSION: Osteomyelitis involving the proximal aspect of the 1st distal phalanx.  Critical Value/emergent results were called by telephone at the time of interpretation on 12/30/2012 at 2:04 PM to Philomena Doheny, RN, who verbally acknowledged these results.   Electronically Signed   By: Bretta Bang M.D.   On: 12/30/2012 14:05      Assessment/Plan Principal Problem:   Osteomyelitis of toe of left foot: Patient started on broad-spectrum IV antibiotics. X-ray ordered on admission confirmed signs of acute osteomyelitis. Have spoken with Dr. August Saucer of orthopedic surgery who will see patient for possible toe amputation Active Problems:   HYPERLIPIDEMIA: Stable, patient intolerant of statins   CORONARY ARTERY DISEASE: Stable.   SLEEP APNEA   Diabetic foot ulcer: See above   Obesity (BMI 30-39.9)  Diabetes mellitus type II uncontrolled with neurologic complications: Holding oral agent, continue Levemir at reduced dose plus sliding scale.  Code Status: DO NOT RESUSCITATE as confirmed by patient Family Communication: Spoke with patient's girlfriend and daughter at the bedside.  Disposition Plan: Likely  here for several days, dependent when surgery if needed his  Time spent: 35 minutes  Hollice Espy [facility] Pager [phone]

## 2012-12-30 NOTE — Consult Note (Addendum)
WOC wound consult note Reason for Consult:Ulceration of left great toe (hallux).  Radiologic tests performed today indicate osteomyelitis.  Bedside RN has conferred with MD and he is to visit with patient shortly to outline his recommendations for POC with this information.  Referral to ortho or CCS services is expected. Wound type:infectious vs pressure Pressure Ulcer POA: No Drainage (amount, consistency, odor) scant Periwound: erythema Dressing procedure/placement/frequency:Recommend either a dry dressing or a saline moistened continually moist dressing until patient can be evaluated by either Orthopedic Surgery or CCS. We would defer to their expertise in this instance. WOC nursing team will not follow, but will remain available to this patient, the nursing, medical and surgical teams.  Please re-consult if needed. Thanks, Ladona Mow, MSN, RN, GNP, [place], [facility]ON[facility] [phone])

## 2012-12-30 NOTE — Progress Notes (Signed)
Pre-visit discussion using our clinic review tool. No additional management support is needed unless otherwise documented below in the visit note.  

## 2012-12-30 NOTE — Progress Notes (Signed)
ANTIBIOTIC CONSULT NOTE - INITIAL  Pharmacy Consult for Zosyn and vancomycin Indication: diabetic foot infection  Allergies  Allergen Reactions  . Atorvastatin     REACTION: myalgias  . Ezetimibe Other (See Comments)    Body ache  . Simvastatin Other (See Comments)    Body ache    Patient Measurements:   Wt 131.5 kg Ht 185 cm  Vital Signs: Temp: 98.5 F (36.9 C) (12/24 1012) Temp src: Oral (12/24 1012) BP: 143/76 mmHg (12/24 1012) Pulse Rate: 93 (12/24 1012) Intake/Output from previous day:   Intake/Output from this shift:    Labs: SCr 0.9,  CrCl ~ 84 mL/min/72kg No results found for this basename: WBC, HGB, PLT, LABCREA, CREATININE,  in the last 72 hours The CrCl is unknown because both a height and weight (above a minimum accepted value) are required for this calculation. No results found for this basename: VANCOTROUGH, Leodis Binet, VANCORANDOM, GENTTROUGH, GENTPEAK, GENTRANDOM, TOBRATROUGH, TOBRAPEAK, TOBRARND, AMIKACINPEAK, AMIKACINTROU, AMIKACIN,  in the last 72 hours   Microbiology: Recent Results (from the past 720 hour(s))  WOUND CULTURE     Status: None   Collection Time    12/15/12  3:34 PM      Result Value Range Status   Culture     Final   Value: Abundant KLEBSIELLA OXYTOCA     Abundant PSEUDOMONAS AERUGINOSA   Gram Stain No WBC Seen   Final   Gram Stain No Squamous Epithelial Cells Seen   Final   Gram Stain Moderate Gram Negative Rods   Final   Gram Stain Moderate Gram Positive Cocci In Pairs   Final   Organism ID, Bacteria KLEBSIELLA OXYTOCA   Final   Organism ID, Bacteria PSEUDOMONAS AERUGINOSA   Final    Medical History: Past Medical History  Diagnosis Date  . Malignant neoplasm of prostate   . Coronary atherosclerosis of unspecified type of vessel, native or graft   . Type II or unspecified type diabetes mellitus with neurological manifestations, not stated as uncontrolled(250.60)   . Impotence of organic origin   . Unspecified  glaucoma(365.9)   . Internal hemorrhoids without mention of complication   . Other and unspecified hyperlipidemia   . Mononeuritis of unspecified site   . Osteoarthrosis, unspecified whether generalized or localized, unspecified site   . Personal history of colonic polyps   . Routine general medical examination at a health care facility   . Hemorrhage of rectum and anus   . Unspecified sleep apnea   . Unspecified venous (peripheral) insufficiency   . Personal history of gallstones   . Personal history of diabetic foot ulcer     saw wound center, resolved 05/08/2010    Medications:  Scheduled:  . aspirin  325 mg Oral Daily  . brimonidine  1 drop Both Eyes Daily  . carvedilol  25 mg Oral BID WC  . enoxaparin (LOVENOX) injection  40 mg Subcutaneous Daily  . losartan  50 mg Oral Daily   And  . hydrochlorothiazide  12.5 mg Oral Daily  . insulin aspart  0-15 Units Subcutaneous TID WC  . insulin aspart  0-5 Units Subcutaneous QHS  . insulin detemir  40 Units Subcutaneous QHS  . multivitamin with minerals  1 tablet Oral Daily  . piperacillin-tazobactam (ZOSYN)  IV  3.375 g Intravenous Q8H  . [START ON 12/31/2012] vancomycin  1,250 mg Intravenous Q12H  . vancomycin  2,000 mg Intravenous Once   Infusions:  . sodium chloride 10 mL/hr at 12/30/12 1208  PRN: acetaminophen, acetaminophen, morphine injection, ondansetron (ZOFRAN) IV, ondansetron, oxyCODONE  Assessment: 62 y/o M diabetic with R foot infection that is worsening despite oral Bactrim/Cipro - admitted for IV Zosyn / vancomycin. Plans noted for imaging studies to r/o osteomyelitis.  Goal of Therapy:  Eradication of infection Adjust antibiotic dosages for renal function Vancomycin trough 15-20  Plan:  1. Vancomycin 2000 mg IV x 1 dose, then 1250 mg IV q12h. 2. Zosyn 3.375 grams IV q8h (extended-infusion). 3. Follow serum creatinine, culture results, clinical course. 4. Check vancomycin trough at steady-state.  Elie Goody, PharmD, BCPS Pager: [phone] 12/30/2012  12:30 PM

## 2012-12-30 NOTE — Progress Notes (Signed)
Results of x-ray called to Dr. Rito Ehrlich.  Patient to be evaluated by orthopedic surgeon.  Philomena Doheny RN

## 2012-12-31 DIAGNOSIS — E1169 Type 2 diabetes mellitus with other specified complication: Principal | ICD-10-CM

## 2012-12-31 DIAGNOSIS — L97509 Non-pressure chronic ulcer of other part of unspecified foot with unspecified severity: Secondary | ICD-10-CM

## 2012-12-31 DIAGNOSIS — L02619 Cutaneous abscess of unspecified foot: Secondary | ICD-10-CM

## 2012-12-31 DIAGNOSIS — I2510 Atherosclerotic heart disease of native coronary artery without angina pectoris: Secondary | ICD-10-CM

## 2012-12-31 LAB — MRSA PCR SCREENING: MRSA by PCR: NEGATIVE

## 2012-12-31 LAB — CBC
MCH: 29 pg (ref 26.0–34.0)
MCV: 84.1 fL (ref 78.0–100.0)
Platelets: 196 10*3/uL (ref 150–400)
RBC: 3.96 MIL/uL — ABNORMAL LOW (ref 4.22–5.81)

## 2012-12-31 LAB — BASIC METABOLIC PANEL
BUN: 17 mg/dL (ref 6–23)
CO2: 25 mEq/L (ref 19–32)
Calcium: 9.3 mg/dL (ref 8.4–10.5)
GFR calc Af Amer: 80 mL/min — ABNORMAL LOW (ref >90)
GFR calc non Af Amer: 69 mL/min — ABNORMAL LOW (ref >90)
Glucose, Bld: 149 mg/dL — ABNORMAL HIGH (ref 70–99)
Sodium: 138 mEq/L (ref 135–145)

## 2012-12-31 LAB — GLUCOSE, CAPILLARY
Glucose-Capillary: 147 mg/dL — ABNORMAL HIGH (ref 70–99)
Glucose-Capillary: 146 mg/dL — ABNORMAL HIGH (ref 70–99)

## 2012-12-31 NOTE — Progress Notes (Signed)
Patient ID: [name], male   DOB: 14-Aug-1950, 62 y.o.   MRN: [id number]  [facility] PROGRESS NOTE  [name] UUV:[id number] DOB: February 17, 1950 DOA: 01/11/13 PCP: Tillman Abide, MD  Brief narrative: Pt is 61 y.o. male with past medical history of CAD and poorly controlled diabetes mellitus with secondary neuropathy who has been having some issues with a cellulitis and ulcer of the first toe on his left foot. Several weeks ago patient had wound cultures done on this ulcer and was started on Bactrim. With little improvement, it was changed over to Cipro. Initially the patient felt like his wound was slightly getting better but now, was worse.   Principal Problem:   Osteomyelitis of toe of left foot - status post Left first great toe ray amputation, post op day #1 - pt doing well and clinically stable - continue broad spectrum ABX - appreciate ortho assistance  - PT while inpatient  Active Problems:   HTN - continue Coreg, Losartan, HCTZ   CORONARY ARTERY DISEASE - clinically stable   DM, type 2 - continue Insulin and Metformin - add SSI    SLEEP APNEA - CPAP at night    Diabetic foot ulcer - management as noted above  - DM control  Consultants:  Ortho   Procedures/Studies: Dg Foot Complete Left   01/11/13   Osteomyelitis involving the proximal aspect of the 1st distal phalanx  Antibiotics:  Vancomycin 12-Jan-2023 -->  Zosyn 1224 -->  Code Status: Full Family Communication: Pt at bedside Disposition Plan: Home when medically stable  HPI/Subjective: No events overnight.   Objective: Filed Vitals:   12/31/12 0150 12/31/12 0254 12/31/12 0550 12/31/12 0935  BP: 130/87 131/82 151/85 120/69  Pulse: 93 97 97 104  Temp: 97.7 F (36.5 C) 97.6 F (36.4 C) 97.5 F (36.4 C) 98.1 F (36.7 C)  TempSrc: Oral Oral Oral Oral  Resp: 18 18 18 16   Height:      Weight:      SpO2: 99% 97% 96% 95%    Intake/Output Summary (Last 24 hours) at 12/31/12 1410 Last data  filed at 12/31/12 1136  Gross per 24 hour  Intake 1658.67 ml  Output    200 ml  Net 1458.67 ml    Exam:   General:  Pt is alert, follows commands appropriately, not in acute distress  Cardiovascular: Regular rate and rhythm, S1/S2, no murmurs, no rubs, no gallops  Respiratory: Clear to auscultation bilaterally, no wheezing, no crackles, no rhonchi  Abdomen: Soft, non tender, non distended, bowel sounds present, no guarding  Extremities: No edema  Neuro: Grossly nonfocal  Data Reviewed: Basic Metabolic Panel:  Recent Labs Lab 01-11-13 1100 12/31/12 0530  NA 134* 138  K 4.4 4.3  CL 97 101  CO2 23 25  GLUCOSE 227* 149*  BUN 19 17  CREATININE 0.90 1.10  CALCIUM 9.5 9.3   CBC:  Recent Labs Lab 01/11/2013 1100 12/31/12 0530  WBC 9.9 8.3  HGB 11.8* 11.5*  HCT 34.2* 33.3*  MCV 85.1 84.1  PLT 217 196   CBG:  Recent Labs Lab 01/11/13 1130 11-Jan-2013 1704 01/11/2013 2310 12/31/12 0747 12/31/12 1129  GLUCAP 184* 124* 116* 147* 237*    Recent Results (from the past 240 hour(s))  WOUND CULTURE     Status: None   Collection Time    January 11, 2013 12:54 PM      Result Value Range Status   Specimen Description FOOT   Final   Special Requests  NONE   Final   Gram Stain     Final   Value: FEW WBC PRESENT, PREDOMINANTLY PMN     RARE SQUAMOUS EPITHELIAL CELLS PRESENT     ABUNDANT GRAM NEGATIVE RODS     MODERATE GRAM POSITIVE COCCI IN PAIRS     IN CLUSTERS   Culture PENDING   Incomplete   Report Status PENDING   Incomplete  MRSA PCR SCREENING     Status: None   Collection Time    12/30/12  7:04 PM      Result Value Range Status   MRSA by PCR NEGATIVE  NEGATIVE Final   Comment:            The GeneXpert MRSA Assay (FDA     approved for NASAL specimens     only), is one component of a     comprehensive MRSA colonization     surveillance program. It is not     intended to diagnose MRSA     infection nor to guide or     monitor treatment for     MRSA infections.      Scheduled Meds: . brimonidine  1 drop Both Eyes Daily  . carvedilol  25 mg Oral BID WC  . losartan  50 mg Oral Daily   And  . hydrochlorothiazide  12.5 mg Oral Daily  . insulin aspart  0-15 Units Subcutaneous TID WC  . insulin aspart  0-5 Units Subcutaneous QHS  . insulin detemir  40 Units Subcutaneous QHS  . metFORMIN  1,000 mg Oral BID WC  . multivitamin with minerals  1 tablet Oral Daily  . piperacillin-tazobactam (ZOSYN)  IV  3.375 g Intravenous Q8H  . vancomycin  1,250 mg Intravenous Q12H   Continuous Infusions: . sodium chloride 10 mL/hr at 12/30/12 1208     Debbora Presto, MD  [facility] Pager [phone]  If 7PM-7AM, please contact night-coverage [email or web address] Password [email or web address] 12/31/2012, 2:10 PM   LOS: 1 day

## 2012-12-31 NOTE — Progress Notes (Signed)
Patient ID: [name], male   DOB: 1950-04-12, 62 y.o.   MRN: [id number] Dressing dry, darco Lahti ordered so he can PWB with Sharrar on and uses walker.

## 2013-01-01 LAB — CBC
MCH: 28.6 pg (ref 26.0–34.0)
MCV: 84.4 fL (ref 78.0–100.0)
Platelets: 206 10*3/uL (ref 150–400)
RDW: 15.6 % — ABNORMAL HIGH (ref 11.5–15.5)

## 2013-01-01 LAB — BASIC METABOLIC PANEL
CO2: 27 mEq/L (ref 19–32)
Calcium: 9.2 mg/dL (ref 8.4–10.5)
Creatinine, Ser: 0.93 mg/dL (ref 0.50–1.35)
GFR calc Af Amer: >90 mL/min (ref >90)
Potassium: 4.1 mEq/L (ref 3.5–5.1)

## 2013-01-01 LAB — GLUCOSE, CAPILLARY: Glucose-Capillary: 178 mg/dL — ABNORMAL HIGH (ref 70–99)

## 2013-01-01 NOTE — Progress Notes (Signed)
Patient ID: [name], male   DOB: 07-20-1950, 62 y.o.   MRN: [id number] [facility] PROGRESS NOTE  [name]:[id number] DOB: 09-27-1950 DOA: 12/30/2012 PCP: Tillman Abide, MD  Brief narrative:  Pt is 62 y.o. male with past medical history of CAD and poorly controlled diabetes mellitus with secondary neuropathy who has been having some issues with a cellulitis and ulcer of the first toe on his left foot. Several weeks ago patient had wound cultures done on this ulcer and was started on Bactrim. With little improvement, it was changed over to Cipro. Initially the patient felt like his wound was slightly getting better but now, was worse.   Principal Problem:  Osteomyelitis of toe of left foot  - status post Left first great toe ray amputation, post op day #2 - pt doing well and clinically stable, ambulating in hallway   - continue broad spectrum ABX - appreciate ortho assistance  - PT while inpatient  Active Problems:  HTN  - continue Coreg, Losartan, HCTZ  - reasonable inpatient control  CORONARY ARTERY DISEASE  - clinically stable  DM, type 2  - continue Insulin and Metformin  - add SSI  SLEEP APNEA  - CPAP at night  Diabetic foot ulcer  - management as noted above  - DM control   Consultants:  Ortho  Procedures/Studies:  Dg Foot Complete Left 12/30/2012 Osteomyelitis involving the proximal aspect of the 1st distal phalanx Antibiotics:  Vancomycin 12/24 -->  Zosyn 1224 -->  Code Status: Full  Family Communication: Pt at bedside  Disposition Plan: Home likely in 1-2 days, when ortho clears for D/C   HPI/Subjective: No events overnight.   Objective: Filed Vitals:   12/31/12 2206 12/31/12 2239 01/01/13 0508 01/01/13 1346  BP:  144/72 136/76 143/81  Pulse:  93 87 91  Temp:  98 F (36.7 C) 98.1 F (36.7 C) 97.8 F (36.6 C)  TempSrc: Oral Oral Oral Oral  Resp: 18 16 20 18   Height:      Weight:      SpO2: 98% 98% 99% 98%    Intake/Output  Summary (Last 24 hours) at 01/01/13 1406 Last data filed at 01/01/13 0929  Gross per 24 hour  Intake   1230 ml  Output      0 ml  Net   1230 ml    Exam:   General:  Pt is alert, follows commands appropriately, not in acute distress  Cardiovascular: Regular rate and rhythm, S1/S2, no murmurs, no rubs, no gallops  Respiratory: Clear to auscultation bilaterally, no wheezing, no crackles, no rhonchi  Abdomen: Soft, non tender, non distended, bowel sounds present, no guarding  Extremities: No edema, pulses DP and PT palpable bilaterally  Neuro: Grossly nonfocal  Data Reviewed: Basic Metabolic Panel:  Recent Labs Lab 12/30/12 1100 12/31/12 0530 01/01/13 0514  NA 134* 138 135  K 4.4 4.3 4.1  CL 97 101 99  CO2 23 25 27   GLUCOSE 227* 149* 181*  BUN 19 17 16   CREATININE 0.90 1.10 0.93  CALCIUM 9.5 9.3 9.2   Liver Function Tests: No results found for this basename: AST, ALT, ALKPHOS, BILITOT, PROT, ALBUMIN,  in the last 168 hours No results found for this basename: LIPASE, AMYLASE,  in the last 168 hours No results found for this basename: AMMONIA,  in the last 168 hours CBC:  Recent Labs Lab 12/30/12 1100 12/31/12 0530 01/01/13 0514  WBC 9.9 8.3 8.3  HGB 11.8* 11.5* 11.0*  HCT 34.2* 33.3* 32.5*  MCV 85.1 84.1 84.4  PLT 217 196 206   Cardiac Enzymes: No results found for this basename: CKTOTAL, CKMB, CKMBINDEX, TROPONINI,  in the last 168 hours BNP: No components found with this basename: POCBNP,  CBG:  Recent Labs Lab 12/31/12 1129 12/31/12 1633 12/31/12 2158 01/01/13 0752 01/01/13 1144  GLUCAP 237* 225* 146* 156* 178*    Recent Results (from the past 240 hour(s))  WOUND CULTURE     Status: None   Collection Time    12/30/12 12:54 PM      Result Value Range Status   Specimen Description FOOT   Final   Special Requests NONE   Final   Gram Stain     Final   Value: FEW WBC PRESENT, PREDOMINANTLY PMN     RARE SQUAMOUS EPITHELIAL CELLS PRESENT      ABUNDANT GRAM NEGATIVE RODS     MODERATE GRAM POSITIVE COCCI IN PAIRS     IN CLUSTERS   Culture     Final   Value: Culture reincubated for better growth     Performed at [facility]   Report Status PENDING   Incomplete  MRSA PCR SCREENING     Status: None   Collection Time    12/30/12  7:04 PM      Result Value Range Status   MRSA by PCR NEGATIVE  NEGATIVE Final   Comment:            The GeneXpert MRSA Assay (FDA     approved for NASAL specimens     only), is one component of a     comprehensive MRSA colonization     surveillance program. It is not     intended to diagnose MRSA     infection nor to guide or     monitor treatment for     MRSA infections.     Scheduled Meds: . brimonidine  1 drop Both Eyes Daily  . carvedilol  25 mg Oral BID WC  . losartan  50 mg Oral Daily   And  . hydrochlorothiazide  12.5 mg Oral Daily  . insulin aspart  0-15 Units Subcutaneous TID WC  . insulin aspart  0-5 Units Subcutaneous QHS  . insulin detemir  40 Units Subcutaneous QHS  . metFORMIN  1,000 mg Oral BID WC  . multivitamin with minerals  1 tablet Oral Daily  . piperacillin-tazobactam (ZOSYN)  IV  3.375 g Intravenous Q8H  . vancomycin  1,250 mg Intravenous Q12H   Continuous Infusions: . sodium chloride 10 mL/hr at 12/30/12 1208   Debbora Presto, MD  [facility] Pager [phone]  If 7PM-7AM, please contact night-coverage [email or web address] Password [email or web address] 01/01/2013, 2:06 PM   LOS: 2 days

## 2013-01-01 NOTE — Progress Notes (Signed)
Pt requested to go on CPAP around 2315. Will return at that time.

## 2013-01-01 NOTE — Progress Notes (Signed)
Placed pt on CPAP 7cmH2O with 2lpm of oxygen bled into the machine. No water was added to the humidification chamber per pt's request. Pt is comfortable and tolerating CPAP well at this time. RT will continue to monitor as needed.

## 2013-01-01 NOTE — Evaluation (Signed)
Physical Therapy Evaluation Patient Details Name: [name] MRN: [id number] DOB: Jun 14, 1950 Today's Date: 01/01/2013 Time: 4696-2952 PT Time Calculation (min): 21 min  PT Assessment / Plan / Recommendation History of Present Illness  pt was admitted for osteomyelitis of L great toe:  s/p amputation  Clinical Impression  Pt presenting with min instability 2* darko Fiser and PWB post op.  Pt ambulating this date with RW, guard assist and cues for PWB.  Pt should progress to MOD I and d/c home with no immediate PT needs.    PT Assessment  Patient needs continued PT services    Follow Up Recommendations  No PT follow up    Does the patient have the potential to tolerate intense rehabilitation      Barriers to Discharge        Equipment Recommendations  Rolling walker with 5" wheels    Recommendations for Other Services OT consult   Frequency Min 4X/week    Precautions / Restrictions Precautions Precautions: Fall Restrictions Weight Bearing Restrictions: Yes LLE Weight Bearing: Partial weight bearing Other Position/Activity Restrictions: with darko Nebel   Pertinent Vitals/Pain Min c/o pain.      Mobility  Bed Mobility Bed Mobility: Not assessed Transfers Transfers: Sit to Stand;Stand to Sit Sit to Stand: 4: Min guard;From toilet Stand to Sit: 4: Min guard Details for Transfer Assistance: cues to extend LLE when sitting Ambulation/Gait Ambulation/Gait Assistance: 4: Min guard Ambulation Distance (Feet): 140 Feet Assistive device: Rolling walker Ambulation/Gait Assistance Details: cues for sequence, posture, position from RW and PWB Gait Pattern: Step-to pattern;Step-through pattern;Shuffle;Trunk flexed    Exercises     PT Diagnosis: Difficulty walking  PT Problem List: Decreased strength;Decreased range of motion;Decreased activity tolerance;Decreased mobility;Decreased knowledge of use of DME;Pain;Obesity PT Treatment Interventions: DME instruction;Gait  training;Stair training;Functional mobility training;Therapeutic activities;Therapeutic exercise;Patient/family education     PT Goals(Current goals can be found in the care plan section) Acute Rehab PT Goals Patient Stated Goal: Protect foot while healing so as to resume previous lifestyle asap PT Goal Formulation: With patient Time For Goal Achievement: 01/08/13 Potential to Achieve Goals: Good  Visit Information  Last PT Received On: 01/01/13 Assistance Needed: +1 History of Present Illness: pt was admitted for osteomyelitis of L great toe:  s/p amputation       Prior Functioning  Home Living Family/patient expects to be discharged to:: Private residence Living Arrangements: Spouse/significant other Available Help at Discharge: Family Type of Home: House Home Access: Stairs to enter Secretary/administrator of Steps: 3 Entrance Stairs-Rails: Right;Left;Can reach both Home Layout: One level Home Equipment: None Additional Comments: pt can use wall on one side (ends just in front of commode).  He can have sons install a grab bar if needed, too Prior Function Level of Independence: Independent Communication Communication: No difficulties    Cognition  Cognition Arousal/Alertness: Awake/alert Behavior During Therapy: WFL for tasks assessed/performed Overall Cognitive Status: Within Functional Limits for tasks assessed    Extremity/Trunk Assessment Upper Extremity Assessment Upper Extremity Assessment: Defer to OT evaluation Lower Extremity Assessment Lower Extremity Assessment: Overall WFL for tasks assessed   Balance    End of Session PT - End of Session Activity Tolerance: Patient tolerated treatment well Patient left: Other (comment) (Left with OT heading into bathroom) Nurse Communication: Mobility status  GP     [name] 01/01/2013, 11:54 AM

## 2013-01-01 NOTE — Evaluation (Signed)
Occupational Therapy Evaluation Patient Details Name: [name] MRN: [id number] DOB: [birth year] Today's Date: 01/01/2013 Time: 1012-1027 OT Time Calculation (min): 15 min  OT Assessment / Plan / Recommendation History of present illness pt was admitted for osteomyelitis of L great toe:  s/p amputation   Clinical Impression   Pt was admitted for the above.  All education was completed.  Pt does not need further OT at this time.      OT Assessment  Patient does not need any further OT services    Follow Up Recommendations  No OT follow up    Barriers to Discharge      Equipment Recommendations  None recommended by OT    Recommendations for Other Services    Frequency       Precautions / Restrictions Precautions Precautions: Fall Restrictions Weight Bearing Restrictions: Yes LLE Weight Bearing: Partial weight bearing Other Position/Activity Restrictions: with [name]   Pertinent Vitals/Pain Pain not rated; repositioned    ADL  Grooming: Min guard Where Assessed - Grooming: Supported standing Upper Body Bathing: Set up Where Assessed - Upper Body Bathing: Unsupported sitting Lower Body Bathing: Minimal assistance Where Assessed - Lower Body Bathing: Supported standing Upper Body Dressing: Minimal assistance (iv) Where Assessed - Upper Body Dressing: Unsupported sitting Lower Body Dressing: Minimal assistance Where Assessed - Lower Body Dressing: Supported sit to stand Toilet Transfer: Hydrographic surveyor Method: Sit to Barista: Comfort height toilet Toileting - Architect and Hygiene: Min guard Where Assessed - Engineer, mining and Hygiene: Sit to stand from 3-in-1 or toilet Equipment Used: Rolling walker Transfers/Ambulation Related to ADLs: ambulated to bathroom with min guard.  Pt has discrepancy in [place] height with [name].  Educated pt to be aware of balance due to this and amputation ADL  Comments: significant other will assist as needed with darko boot.  Discussed sequence for pants: pt has a large bandage/ace wrap.  Educated on shower transfer, but pt will need clearance from MD before he showers    OT Diagnosis:    OT Problem List:   OT Treatment Interventions:     OT Goals(Current goals can be found in the care plan section)    Visit Information  Last OT Received On: 01/01/13 Assistance Needed: +1 History of Present Illness: pt was admitted for osteomyelitis of L great toe:  s/p amputation       Prior Functioning     Home Living Family/patient expects to be discharged to:: Private residence Living Arrangements: Spouse/significant other Available Help at Discharge: Family Additional Comments: pt can use wall on one side (ends just in front of commode).  He can have sons install a grab bar if needed, too Prior Function Level of Independence: Independent Communication Communication: No difficulties         Vision/Perception     Cognition  Cognition Arousal/Alertness: Awake/alert Behavior During Therapy: WFL for tasks assessed/performed Overall Cognitive Status: Within Functional Limits for tasks assessed    Extremity/Trunk Assessment Upper Extremity Assessment Upper Extremity Assessment: Defer to OT evaluation     Mobility Transfers Transfers: Sit to Stand Sit to Stand: 4: Min guard;From toilet Details for Transfer Assistance: cues to extend LLE when sitting     Exercise     Balance     End of Session OT - End of Session Activity Tolerance: Patient tolerated treatment well Patient left: in chair;with call bell/phone within reach;with family/visitor present  GO     [facility]  01/01/2013, 10:59 AM Marica Otter, OTR/L [phone] 01/01/2013

## 2013-01-01 NOTE — Progress Notes (Signed)
Patient ID: [name], male   DOB: September 17, 1950, 62 y.o.   MRN: [id number] Subjective: 2 Days Post-Op Procedure(s) (LRB): AMPUTATION RAY  (Left) Awake, alert and oriented x 4. All of family in the room. Patient reports pain as mild.    Objective:   VITALS:  Temp:  [97.8 F (36.6 C)-98.1 F (36.7 C)] 97.8 F (36.6 C) (12/26 1346) Pulse Rate:  [87-93] 91 (12/26 1346) Resp:  [16-20] 18 (12/26 1346) BP: (136-144)/(72-81) 143/81 mmHg (12/26 1346) SpO2:  [98 %-99 %] 98 % (12/26 1346)  Neurologically intact ABD soft Incision: no drainage   LABS  Recent Labs  12/30/12 1100 12/31/12 0530 01/01/13 0514  HGB 11.8* 11.5* 11.0*  WBC 9.9 8.3 8.3  PLT 217 196 206    Recent Labs  12/31/12 0530 01/01/13 0514  NA 138 135  K 4.3 4.1  CL 101 99  CO2 25 27  BUN 17 16  CREATININE 1.10 0.93  GLUCOSE 149* 181*   No results found for this basename: LABPT, INR,  in the last 72 hours   Assessment/Plan: 2 Days Post-Op Procedure(s) (LRB): AMPUTATION RAY  (Left)  Advance diet Up with therapy Continue ABX therapy due to left foot infection Discharge home with home health post dressing changes and when ID susceptibility of organisms know so that oral medication.  [name] 01/01/2013, 6:16 PM

## 2013-01-02 LAB — BASIC METABOLIC PANEL
BUN: 17 mg/dL (ref 6–23)
Chloride: 99 mEq/L (ref 96–112)
Creatinine, Ser: 0.89 mg/dL (ref 0.50–1.35)
GFR calc Af Amer: >90 mL/min (ref >90)
Glucose, Bld: 156 mg/dL — ABNORMAL HIGH (ref 70–99)

## 2013-01-02 LAB — CBC
HCT: 33.5 % — ABNORMAL LOW (ref 39.0–52.0)
Hemoglobin: 11.3 g/dL — ABNORMAL LOW (ref 13.0–17.0)
MCHC: 33.7 g/dL (ref 30.0–36.0)
MCV: 84.8 fL (ref 78.0–100.0)
RBC: 3.95 MIL/uL — ABNORMAL LOW (ref 4.22–5.81)
RDW: 15.6 % — ABNORMAL HIGH (ref 11.5–15.5)
WBC: 8 10*3/uL (ref 4.0–10.5)

## 2013-01-02 LAB — GLUCOSE, CAPILLARY
Glucose-Capillary: 128 mg/dL — ABNORMAL HIGH (ref 70–99)
Glucose-Capillary: 189 mg/dL — ABNORMAL HIGH (ref 70–99)
Glucose-Capillary: 163 mg/dL — ABNORMAL HIGH (ref 70–99)
Glucose-Capillary: 145 mg/dL — ABNORMAL HIGH (ref 70–99)

## 2013-01-02 LAB — WOUND CULTURE

## 2013-01-02 MED ORDER — VANCOMYCIN HCL 10 G IV SOLR
1500.0000 mg | Freq: Two times a day (BID) | INTRAVENOUS | Status: DC
  Administered 2013-01-02 – 2013-01-03 (×3): 1500 mg via INTRAVENOUS
  Filled 2013-01-02 (×4): qty 1500

## 2013-01-02 NOTE — Progress Notes (Signed)
Physical Therapy Treatment Patient Details Name: [name] MRN: [id number] DOB: Feb 15, 1950 Today's Date: 01/02/2013 Time: 1145-1209 PT Time Calculation (min): 24 min  PT Assessment / Plan / Recommendation  History of Present Illness pt was admitted for osteomyelitis of L great toe:  s/p amputation   PT Comments   Pt OOB in recliner eating lunch spouse in room.  Feeling "good". Amb in hallway and performed steps (2 with B reachable rails).  Pt and spouse instructed on safety/sequencing with stairs.  Pt and spouse aware of proper use of Darco Hail.  Pt and spouse instructed on car transfers/shower transfer sequencing, elevation of L LE when ever at rest and TE's of ankle pumps and knee presses for increased circulation.  Pt aware of his PWB and instructed on walker use all times to ensure 50% WBing thru L LE.   Pt plans to D/C to home with spouse.     Follow Up Recommendations  No PT follow up     Does the patient have the potential to tolerate intense rehabilitation     Barriers to Discharge        Equipment Recommendations  Rolling walker with 5" wheels (will need MD to order prior to D/C)   Recommendations for Other Services    Frequency Min 4X/week   Progress towards PT Goals Progress towards PT goals: Progressing toward goals  Plan      Precautions / Restrictions Precautions Precautions: Fall Restrictions Weight Bearing Restrictions: Yes LLE Weight Bearing: Partial weight bearing Other Position/Activity Restrictions: with darko Crafton    Pertinent Vitals/Pain No c/o pain    Mobility  Bed Mobility Bed Mobility: Not assessed Details for Bed Mobility Assistance: Pt OOB in recliner Transfers Transfers: Sit to Stand;Stand to Sit Sit to Stand: 5: Supervision;From chair/3-in-1 Stand to Sit: 5: Supervision;To chair/3-in-1 Details for Transfer Assistance: increased time Ambulation/Gait Ambulation/Gait Assistance: 5: Supervision Ambulation Distance (Feet): 65  Feet Assistive device: Rolling walker Ambulation/Gait Assistance Details: Pt knowledgable to wear his Darco Ozment all times when ever up on feet.  25% VC's on proper placement of L LE during stance to ensure 50% WBing plus proper walker to self distance. Gait Pattern: Step-to pattern Gait velocity: decreased Stairs: Yes Stairs Assistance: 4: Min guard Stairs Assistance Details (indicate cue type and reason): 50% VC's to increase WBing thru B UE's to ensure PWB thru L LE Stair Management Technique: Two rails;Forwards Number of Stairs: 2    Exercises  B ankle pumps B knee presses    PT Goals (current goals can now be found in the care plan section)    Visit Information  Last PT Received On: 01/02/13 Assistance Needed: +1 History of Present Illness: pt was admitted for osteomyelitis of L great toe:  s/p amputation    Subjective Data      Cognition       Balance     End of Session PT - End of Session Equipment Utilized During Treatment: Gait belt Activity Tolerance: Patient tolerated treatment well Patient left: in chair;with call bell/phone within reach;with family/visitor present   Felecia Shelling  PTA [facility]  Acute  Rehab Pager      [phone]

## 2013-01-02 NOTE — Progress Notes (Signed)
   Subjective:  No events  Objective:   VITALS:   Filed Vitals:   01/01/13 1346 01/01/13 1900 01/01/13 2323 01/02/13 0543  BP: 143/81 134/78  143/75  Pulse: 91 98 94 84  Temp: 97.8 F (36.6 C) 98.3 F (36.8 C)  98.6 F (37 C)  TempSrc: Oral   Oral  Resp: 18 20 18 20   Height:      Weight:      SpO2: 98% 98% 100% 100%    Neurologically intact Neurovascular intact Dorsiflexion/Plantar flexion intact Incision: no drainage No cellulitis present Compartment soft   Lab Results  Component Value Date   WBC 8.0 01/02/2013   HGB 11.3* 01/02/2013   HCT 33.5* 01/02/2013   MCV 84.8 01/02/2013   PLT 193 01/02/2013     Assessment/Plan: 3 Days Post-Op   Problem List Items Addressed This Visit     Cardiovascular and Mediastinum   CORONARY ARTERY DISEASE   Relevant Medications      carvedilol (COREG) tablet 25 mg      losartan (COZAAR) tablet 50 mg      hydrochlorothiazide (MICROZIDE) capsule 12.5 mg      losartan-hydrochlorothiazide (HYZAAR) 50-12.5 MG per tablet      thrombin spray 20,000 Units (Completed)     Endocrine   Type II or unspecified type diabetes mellitus with neurological manifestations, uncontrolled(250.62)   Relevant Medications      insulin aspart (novoLOG) injection 0-15 Units      insulin aspart (novoLOG) injection 0-5 Units      insulin detemir (LEVEMIR) injection 40 Units      metFORMIN (GLUCOPHAGE) 1000 MG tablet      metFORMIN (GLUCOPHAGE) tablet 1,000 mg     Musculoskeletal and Integument   *Osteomyelitis of toe of left foot     Other   Obesity (BMI 30-39.9) (Chronic)   Diabetic foot ulcer - Primary   Cellulitis and abscess of foot      Advance diet Up with PT/OT DVT ppx - SCDs, ambulation PWB left and lower extremity Dressings changed Abx per primary team - appreciate assistance May d/c home from ortho standpoint and f/u with Dr. August Saucer in 2 weeks   Cheral Almas 01/02/2013, 8:46 AM [phone]

## 2013-01-02 NOTE — Progress Notes (Signed)
ANTIBIOTIC CONSULT NOTE - FOLLOW UP  Pharmacy Consult for vancomycin Indication: diabetic foot infection  Allergies  Allergen Reactions  . Atorvastatin     REACTION: myalgias  . Ezetimibe Other (See Comments)    Body ache  . Simvastatin Other (See Comments)    Body ache    Patient Measurements: Height: 6\' 1"  (185.4 cm) Weight: 288 lb 12.8 oz (131 kg) IBW/kg (Calculated) : 79.9 Adjusted Body Weight:   Vital Signs: Temp: 98.3 F (36.8 C) (12/26 1900) Temp src: Oral (12/26 1346) BP: 134/78 mmHg (12/26 1900) Pulse Rate: 94 (12/26 2323) Intake/Output from previous day: 12/26 0701 - 12/27 0700 In: 840 [P.O.:840] Out: -  Intake/Output from this shift:    Labs:  Recent Labs  12/30/12 1100 12/31/12 0530 01/01/13 0514  WBC 9.9 8.3 8.3  HGB 11.8* 11.5* 11.0*  PLT 217 196 206  CREATININE 0.90 1.10 0.93   Estimated Creatinine Clearance: 113.8 ml/min (by C-G formula based on Cr of 0.93).  Recent Labs  01/01/13 2320  VANCOTROUGH 8.6*     Microbiology: Recent Results (from the past 720 hour(s))  WOUND CULTURE     Status: None   Collection Time    12/15/12  3:34 PM      Result Value Range Status   Culture     Final   Value: Abundant KLEBSIELLA OXYTOCA     Abundant PSEUDOMONAS AERUGINOSA   Gram Stain No WBC Seen   Final   Gram Stain No Squamous Epithelial Cells Seen   Final   Gram Stain Moderate Gram Negative Rods   Final   Gram Stain Moderate Gram Positive Cocci In Pairs   Final   Organism ID, Bacteria KLEBSIELLA OXYTOCA   Final   Organism ID, Bacteria PSEUDOMONAS AERUGINOSA   Final  WOUND CULTURE     Status: None   Collection Time    12/30/12 12:54 PM      Result Value Range Status   Specimen Description FOOT   Final   Special Requests NONE   Final   Gram Stain     Final   Value: FEW WBC PRESENT, PREDOMINANTLY PMN     RARE SQUAMOUS EPITHELIAL CELLS PRESENT     ABUNDANT GRAM NEGATIVE RODS     MODERATE GRAM POSITIVE COCCI IN PAIRS     IN CLUSTERS   Culture     Final   Value: Culture reincubated for better growth     Performed at [facility]   Report Status PENDING   Incomplete  MRSA PCR SCREENING     Status: None   Collection Time    12/30/12  7:04 PM      Result Value Range Status   MRSA by PCR NEGATIVE  NEGATIVE Final   Comment:            The GeneXpert MRSA Assay (FDA     approved for NASAL specimens     only), is one component of a     comprehensive MRSA colonization     surveillance program. It is not     intended to diagnose MRSA     infection nor to guide or     monitor treatment for     MRSA infections.    Anti-infectives   Start     Dose/Rate Route Frequency Ordered Stop   01/02/13 0600  vancomycin (VANCOCIN) 1,500 mg in sodium chloride 0.9 % 500 mL IVPB     1,500 mg 250 mL/hr over 120 Minutes Intravenous  Every 12 hours 01/02/13 0052     12/31/12 0000  vancomycin (VANCOCIN) 1,250 mg in sodium chloride 0.9 % 250 mL IVPB     1,250 mg 166.7 mL/hr over 90 Minutes Intravenous Every 12 hours 12/30/12 1052     12/30/12 1400  piperacillin-tazobactam (ZOSYN) IVPB 3.375 g     3.375 g 12.5 mL/hr over 240 Minutes Intravenous 3 times per day 12/30/12 1052     12/30/12 1130  vancomycin (VANCOCIN) 2,000 mg in sodium chloride 0.9 % 500 mL IVPB     2,000 mg 250 mL/hr over 120 Minutes Intravenous  Once 12/30/12 1052 12/30/12 1407      Assessment: Patient with low vancomycin level.  PM dose charted after level drawn  Goal of Therapy:  Vancomycin trough level 15-20 mcg/ml  Plan:  Measure antibiotic drug levels at steady state Follow up culture results Change to vancomycin 1500mg  iv q12hr, next dose at 0600  [name], [name] 01/02/2013,12:55 AM

## 2013-01-02 NOTE — Progress Notes (Signed)
Patient already on CPAP with nasal mask and 2L of oxygen being bled in.

## 2013-01-02 NOTE — Progress Notes (Signed)
Patient ID: [name], male   DOB: 11-30-1950, 62 y.o.   MRN: [id number]  [facility] PROGRESS NOTE  [name] WUJ:[id number] DOB: 1950/09/29 DOA: 12/30/2012 PCP: Tillman Abide, MD  Brief narrative:  Pt is 62 y.o. male with past medical history of CAD and poorly controlled diabetes mellitus with secondary neuropathy who has been having some issues with a cellulitis and ulcer of the first toe on his left foot. Several weeks ago patient had wound cultures done on this ulcer and was started on Bactrim. With little improvement, it was changed over to Cipro. Initially the patient felt like his wound was slightly getting better but now, was worse.   Principal Problem:  Osteomyelitis of toe of left foot  - status post Left first great toe ray amputation, post op day #3  - pt doing well and clinically stable, ambulating in hallway  - continue broad spectrum ABX, wound culture final report pending  - appreciate ortho assistance  - PT while inpatient  Active Problems:  HTN  - continue Coreg, Losartan, HCTZ  - reasonable inpatient control  CORONARY ARTERY DISEASE  - clinically stable  DM, type 2  - continue Insulin and Metformin  - add SSI  SLEEP APNEA  - CPAP at night  Diabetic foot ulcer  - management as noted above  - DM control   Consultants:  Ortho  Procedures/Studies:  Dg Foot Complete Left 12/30/2012 Osteomyelitis involving the proximal aspect of the 1st distal phalanx Antibiotics:  Vancomycin 12/24 -->  Zosyn 1224 -->  Code Status: Full  Family Communication: Pt at bedside  Disposition Plan: Home likely in 1-2 days, when final repot on wound culture is back   HPI/Subjective: No events overnight.   Objective: Filed Vitals:   01/01/13 1346 01/01/13 1900 01/01/13 2323 01/02/13 0543  BP: 143/81 134/78  143/75  Pulse: 91 98 94 84  Temp: 97.8 F (36.6 C) 98.3 F (36.8 C)  98.6 F (37 C)  TempSrc: Oral   Oral  Resp: 18 20 18 20   Height:      Weight:       SpO2: 98% 98% 100% 100%    Intake/Output Summary (Last 24 hours) at 01/02/13 1122 Last data filed at 01/02/13 0400  Gross per 24 hour  Intake   1290 ml  Output      0 ml  Net   1290 ml    Exam:   General:  Pt is alert, follows commands appropriately, not in acute distress  Cardiovascular: Regular rate and rhythm, S1/S2, no murmurs, no rubs, no gallops  Respiratory: Clear to auscultation bilaterally, no wheezing, no crackles, no rhonchi  Abdomen: Soft, non tender, non distended, bowel sounds present, no guarding  Extremities: No edema, pulses DP and PT palpable bilaterally  Data Reviewed: Basic Metabolic Panel:  Recent Labs Lab 12/30/12 1100 12/31/12 0530 01/01/13 0514 01/02/13 0543  NA 134* 138 135 135  K 4.4 4.3 4.1 4.4  CL 97 101 99 99  CO2 23 25 27 27   GLUCOSE 227* 149* 181* 156*  BUN 19 17 16 17   CREATININE 0.90 1.10 0.93 0.89  CALCIUM 9.5 9.3 9.2 9.6   CBC:  Recent Labs Lab 12/30/12 1100 12/31/12 0530 01/01/13 0514 01/02/13 0543  WBC 9.9 8.3 8.3 8.0  HGB 11.8* 11.5* 11.0* 11.3*  HCT 34.2* 33.3* 32.5* 33.5*  MCV 85.1 84.1 84.4 84.8  PLT 217 196 206 193  CBG:  Recent Labs Lab 12/31/12 1633 12/31/12 2158 01/01/13  4098 01/01/13 1144 01/01/13 2055  GLUCAP 225* 146* 156* 178* 159*    Recent Results (from the past 240 hour(s))  WOUND CULTURE     Status: None   Collection Time    12/30/12 12:54 PM      Result Value Range Status   Specimen Description FOOT   Final   Special Requests NONE   Final   Gram Stain     Final   Value: FEW WBC PRESENT, PREDOMINANTLY PMN     RARE SQUAMOUS EPITHELIAL CELLS PRESENT     ABUNDANT GRAM NEGATIVE RODS     MODERATE GRAM POSITIVE COCCI IN PAIRS     IN CLUSTERS   Culture     Final   Value: MULTIPLE ORGANISMS PRESENT, NONE PREDOMINANT NO STAPHYLOCOCCUS AUREUS ISOLATED NO GROUP A STREP (S.PYOGENES) ISOLATED     Performed at [facility]   Report Status 01/02/2013 FINAL   Final  MRSA PCR SCREENING      Status: None   Collection Time    12/30/12  7:04 PM      Result Value Range Status   MRSA by PCR NEGATIVE  NEGATIVE Final   Comment:            The GeneXpert MRSA Assay (FDA     approved for NASAL specimens     only), is one component of a     comprehensive MRSA colonization     surveillance program. It is not     intended to diagnose MRSA     infection nor to guide or     monitor treatment for     MRSA infections.     Scheduled Meds: . brimonidine  1 drop Both Eyes Daily  . carvedilol  25 mg Oral BID WC  . losartan  50 mg Oral Daily   And  . hydrochlorothiazide  12.5 mg Oral Daily  . insulin aspart  0-15 Units Subcutaneous TID WC  . insulin aspart  0-5 Units Subcutaneous QHS  . insulin detemir  40 Units Subcutaneous QHS  . metFORMIN  1,000 mg Oral BID WC  . multivitamin with minerals  1 tablet Oral Daily  . piperacillin-tazobactam (ZOSYN)  IV  3.375 g Intravenous Q8H  . vancomycin  1,500 mg Intravenous Q12H   Continuous Infusions: . sodium chloride 10 mL/hr at 12/30/12 1208   Debbora Presto, MD  [facility] Pager [phone]  If 7PM-7AM, please contact night-coverage [email or web address] Password [email or web address] 01/02/2013, 11:22 AM   LOS: 3 days

## 2013-01-03 DIAGNOSIS — C61 Malignant neoplasm of prostate: Secondary | ICD-10-CM

## 2013-01-03 LAB — CBC
HCT: 32.7 % — ABNORMAL LOW (ref 39.0–52.0)
MCV: 85.4 fL (ref 78.0–100.0)
Platelets: 185 10*3/uL (ref 150–400)
RBC: 3.83 MIL/uL — ABNORMAL LOW (ref 4.22–5.81)
RDW: 15.6 % — ABNORMAL HIGH (ref 11.5–15.5)
WBC: 8.2 10*3/uL (ref 4.0–10.5)

## 2013-01-03 LAB — BASIC METABOLIC PANEL
CO2: 28 mEq/L (ref 19–32)
Chloride: 100 mEq/L (ref 96–112)
Creatinine, Ser: 1.01 mg/dL (ref 0.50–1.35)
GFR calc Af Amer: 89 mL/min — ABNORMAL LOW (ref >90)
GFR calc non Af Amer: 77 mL/min — ABNORMAL LOW (ref >90)
Sodium: 137 mEq/L (ref 135–145)

## 2013-01-03 MED ORDER — AMOXICILLIN-POT CLAVULANATE 875-125 MG PO TABS: 1.0000 | ORAL_TABLET | Freq: Two times a day (BID) | ORAL | Status: DC

## 2013-01-03 MED ORDER — OXYCODONE HCL 5 MG PO TABS: 5.0000 mg | ORAL_TABLET | ORAL | Status: DC | PRN

## 2013-01-03 MED ORDER — CIPROFLOXACIN HCL 500 MG PO TABS: 500.0000 mg | ORAL_TABLET | Freq: Two times a day (BID) | ORAL | Status: DC

## 2013-01-03 NOTE — Anesthesia Postprocedure Evaluation (Signed)
Anesthesia Post Note  Patient: [name]  Procedure(s) Performed: Procedure(s) (LRB): AMPUTATION RAY  (Left)  Anesthesia type: MAC  Patient location: PACU  Post pain: Pain level controlled  Post assessment: Post-op Vital signs reviewed  Last Vitals: BP 141/72  Pulse 76  Temp(Src) 36.8 C (Oral)  Resp 16  Ht 6\' 1"  (1.854 m)  Wt 288 lb 12.8 oz (131 kg)  BMI 38.11 kg/m2  SpO2 98%  Post vital signs: Reviewed  Level of consciousness: awake  Complications: No apparent anesthesia complications

## 2013-01-03 NOTE — Progress Notes (Signed)
   CARE MANAGEMENT NOTE 01/03/2013  Patient:  [name]   Account Number:  [id number]  Date Initiated:  01/03/2013  Documentation initiated by:  [facility]  Subjective/Objective Assessment:     Action/Plan:   No HH needs identifed.   Anticipated DC Date:  01/03/2013   Anticipated DC Plan:  HOME/SELF CARE      DC Planning Services  CM consult      Choice offered to / List presented to:     DME arranged  [name]      DME agency  [facility]        Status of service:  Completed, signed off Medicare Important Message given?   (If response is "NO", the following Medicare IM given date fields will be blank) Date Medicare IM given:   Date Additional Medicare IM given:    Discharge Disposition:  HOME/SELF CARE  Per UR Regulation:    If discussed at Long Length of Stay Meetings, dates discussed:    Comments:  Contacted [facility] DME rep for RW for scheduled dc home. Martin Donning RN CCM Case Mgmt phone [phone]

## 2013-01-03 NOTE — Discharge Summary (Signed)
Physician Discharge Summary  [name]:[id number] DOB: May 22, 1950 DOA: 12/30/2012  PCP: Tillman Abide, MD  Admit date: 12/30/2012 Discharge date: 01/03/2013  Recommendations for Outpatient Follow-up:  1. Pt will need to follow up with PCP in 2-3 weeks post discharge 2. Please obtain BMP to evaluate electrolytes and kidney function 3. Please also check CBC to evaluate Hg and Hct levels 4. Please note that infectious disease specialist recommended continuation of ciprofloxacin and Augmentin upon discharge at least for 2 weeks therapy. 5. Recommendation was to followup with orthopedic specialist to evaluate healing of the wound and if the wound is still not healed antibiotics need to be continued until wound completely healed.  Discharge Diagnoses:  Principal Problem:   Osteomyelitis of toe of left foot Active Problems:   HYPERLIPIDEMIA   CORONARY ARTERY DISEASE   SLEEP APNEA   Diabetic foot ulcer   Obesity (BMI 30-39.9)   Diabetic foot infection  Discharge Condition: Stable  Diet recommendation: Heart healthy diet discussed in details   Brief narrative:  Pt is 62 y.o. male with past medical history of CAD and poorly controlled diabetes mellitus with secondary neuropathy who has been having some issues with a cellulitis and ulcer of the first toe on his left foot. Several weeks ago patient had wound cultures done on this ulcer and was started on Bactrim. With little improvement, it was changed over to Cipro. Initially the patient felt like his wound was slightly getting better but now, was worse.   Principal Problem:  Osteomyelitis of toe of left foot  - status post Left first great toe ray amputation, post op day #4  - pt doing well and clinically stable, ambulating in hallway  - Patient was initially placed on broad-spectrum antibiotic and has responded well  - appreciate ortho assistance  - Discussed with infectious disease the choice of antibiotic, Augmentin and Cipro  was recommended as the previous wound culture indicated sensitivity to those antibiotics - Repeat wound culture indicated with mixed bacterial flora and this was again discussed with infectious disease specialist Active Problems:  HTN  - continue Coreg, Losartan, HCTZ  - reasonable inpatient control  CORONARY ARTERY DISEASE  - clinically stable  DM, type 2  - continue Insulin and Metformin  SLEEP APNEA  - CPAP at night  Diabetic foot ulcer  - management as noted above  - DM control   Consultants:  Ortho  Infectious disease over the phone Procedures/Studies:  Dg Foot Complete Left 12/30/2012 Osteomyelitis involving the proximal aspect of the 1st distal phalanx Antibiotics:  Vancomycin 12/24 --> 12/28 Zosyn 1224 --> 12/28 Augmentin and ciprofloxacin upon discharge  Code Status: Full  Family Communication: Pt at bedside   Discharge Exam: Filed Vitals:   01/03/13 0634  BP: 141/72  Pulse: 76  Temp: 98.2 F (36.8 C)  Resp: 16   Filed Vitals:   01/02/13 0543 01/02/13 1413 01/02/13 2100 01/03/13 0634  BP: 143/75 133/57 126/76 141/72  Pulse: 84 63 80 76  Temp: 98.6 F (37 C) 97.7 F (36.5 C) 97.3 F (36.3 C) 98.2 F (36.8 C)  TempSrc: Oral Oral Oral Oral  Resp: 20 20 18 16   Height:      Weight:      SpO2: 100% 94% 95% 98%    General: Pt is alert, follows commands appropriately, not in acute distress Cardiovascular: Regular rate and rhythm, S1/S2 +, no murmurs, no rubs, no gallops Respiratory: Clear to auscultation bilaterally, no wheezing, no crackles, no rhonchi Abdominal:  Soft, non tender, non distended, bowel sounds +, no guarding Extremities: no edema, no cyanosis, pulses palpable bilaterally DP and PT Neuro: Grossly nonfocal  Discharge Instructions  Discharge Orders   Future Appointments Provider Department Dept Phone   01/04/2013 10:00 AM [facility] [phone]   01/19/2013 8:30 AM Karie Schwalbe,  MD [facility] [place] [phone]   01/19/2013 2:00 PM Carlus Pavlov, MD [facility] [phone]   Future Orders Complete By Expires   Diet - low sodium heart healthy  As directed    Increase activity slowly  As directed        Medication List         amoxicillin-clavulanate 875-125 MG per tablet  Commonly known as:  AUGMENTIN  Take 1 tablet by mouth 2 (two) times daily.     aspirin 325 MG tablet  Take 325 mg by mouth daily.     brimonidine 0.15 % ophthalmic solution  Commonly known as:  ALPHAGAN  Place 1 drop into both eyes daily.     carvedilol 25 MG tablet  Commonly known as:  COREG  Take 1 tablet (25 mg total) by mouth 2 (two) times daily with a meal.     ciprofloxacin 500 MG tablet  Commonly known as:  CIPRO  Take 1 tablet (500 mg total) by mouth 2 (two) times daily.     insulin detemir 100 UNIT/ML injection  Commonly known as:  LEVEMIR  Inject 0.65 mLs (65 Units total) into the skin at bedtime.     insulin lispro 100 UNIT/ML Sopn  Commonly known as:  HUMALOG PEN  Inject up to 25 units into the skin before each of the 3 meals. Dx code: 250.60.     losartan-hydrochlorothiazide 50-12.5 MG per tablet  Commonly known as:  HYZAAR  Take 1 tablet by mouth daily.     metFORMIN 1000 MG tablet  Commonly known as:  GLUCOPHAGE  Take 1,000 mg by mouth 2 (two) times daily with a meal.     multivitamin tablet  Take 1 tablet by mouth daily.     oxyCODONE 5 MG immediate release tablet  Commonly known as:  Oxy IR/ROXICODONE  Take 1 tablet (5 mg total) by mouth every 4 (four) hours as needed for moderate pain.           Follow-up Information   Follow up with Tillman Abide, MD In 2 weeks.   Specialties:  Internal Medicine, Pediatrics   Contact information:   [address] [place] [address] [phone]       Schedule an appointment as soon as possible for a visit with Cammy Copa, MD.   Specialty:   Orthopedic Surgery   Contact information:   [address] [place] [address] [phone]       Follow up with Debbora Presto, MD. (As needed if symptoms worsen cell [phone])    Specialty:  Internal Medicine   Contact information:   [address] [place] [address] [phone]        The results of significant diagnostics from this hospitalization (including imaging, microbiology, ancillary and laboratory) are listed below for reference.     Microbiology: Recent Results (from the past 240 hour(s))  WOUND CULTURE     Status: None   Collection Time    12/30/12 12:54 PM      Result Value Range Status   Specimen Description FOOT  Final   Special Requests NONE   Final   Gram Stain     Final   Value: FEW WBC PRESENT, PREDOMINANTLY PMN     RARE SQUAMOUS EPITHELIAL CELLS PRESENT     ABUNDANT GRAM NEGATIVE RODS     MODERATE GRAM POSITIVE COCCI IN PAIRS     IN CLUSTERS   Culture     Final   Value: MULTIPLE ORGANISMS PRESENT, NONE PREDOMINANT NO STAPHYLOCOCCUS AUREUS ISOLATED NO GROUP A STREP (S.PYOGENES) ISOLATED     Performed at [facility]   Report Status 01/02/2013 FINAL   Final  MRSA PCR SCREENING     Status: None   Collection Time    12/30/12  7:04 PM      Result Value Range Status   MRSA by PCR NEGATIVE  NEGATIVE Final   Comment:            The GeneXpert MRSA Assay (FDA     approved for NASAL specimens     only), is one component of a     comprehensive MRSA colonization     surveillance program. It is not     intended to diagnose MRSA     infection nor to guide or     monitor treatment for     MRSA infections.     Labs: Basic Metabolic Panel:  Recent Labs Lab 12/30/12 1100 12/31/12 0530 01/01/13 0514 01/02/13 0543 01/03/13 0602  NA 134* 138 135 135 137  K 4.4 4.3 4.1 4.4 3.9  CL 97 101 99 99 100  CO2 23 25 27 27 28   GLUCOSE 227* 149* 181* 156* 136*  BUN 19 17 16 17 18   CREATININE 0.90 1.10 0.93 0.89 1.01   CALCIUM 9.5 9.3 9.2 9.6 9.2   CBC:  Recent Labs Lab 12/30/12 1100 12/31/12 0530 01/01/13 0514 01/02/13 0543 01/03/13 0602  WBC 9.9 8.3 8.3 8.0 8.2  HGB 11.8* 11.5* 11.0* 11.3* 10.9*  HCT 34.2* 33.3* 32.5* 33.5* 32.7*  MCV 85.1 84.1 84.4 84.8 85.4  PLT 217 196 206 193 185   CBG:  Recent Labs Lab 01/02/13 0742 01/02/13 1106 01/02/13 1649 01/02/13 2102 01/03/13 0736  GLUCAP 128* 189* 163* 145* 137*     SIGNED: Time coordinating discharge: Over 30 minutes  Debbora Presto, MD  [facility] 01/03/2013, 9:43 AM Pager [phone]  If 7PM-7AM, please contact night-coverage [email or web address] Password [email or web address]

## 2013-01-04 ENCOUNTER — Encounter (HOSPITAL_BASED_OUTPATIENT_CLINIC_OR_DEPARTMENT_OTHER): Payer: No Typology Code available for payment source

## 2013-01-04 ENCOUNTER — Ambulatory Visit: Payer: No Typology Code available for payment source | Admitting: Internal Medicine

## 2013-01-04 ENCOUNTER — Encounter (HOSPITAL_COMMUNITY): Payer: Self-pay | Admitting: Orthopedic Surgery

## 2013-01-04 NOTE — Progress Notes (Signed)
Discharge summary sent to payer through [facility]  

## 2013-01-19 ENCOUNTER — Ambulatory Visit (INDEPENDENT_AMBULATORY_CARE_PROVIDER_SITE_OTHER): Payer: No Typology Code available for payment source | Admitting: Internal Medicine

## 2013-01-19 ENCOUNTER — Encounter: Payer: Self-pay | Admitting: Internal Medicine

## 2013-01-19 VITALS — BP 120/70 | HR 85 | Temp 97.8°F | Wt 287.0 lb

## 2013-01-19 VITALS — BP 124/62 | HR 89 | Temp 97.5°F | Resp 16 | Wt 287.6 lb

## 2013-01-19 DIAGNOSIS — E1149 Type 2 diabetes mellitus with other diabetic neurological complication: Secondary | ICD-10-CM

## 2013-01-19 DIAGNOSIS — Z0000 Encounter for general adult medical examination without abnormal findings: Secondary | ICD-10-CM

## 2013-01-19 DIAGNOSIS — C61 Malignant neoplasm of prostate: Secondary | ICD-10-CM

## 2013-01-19 DIAGNOSIS — E785 Hyperlipidemia, unspecified: Secondary | ICD-10-CM

## 2013-01-19 DIAGNOSIS — I2510 Atherosclerotic heart disease of native coronary artery without angina pectoris: Secondary | ICD-10-CM

## 2013-01-19 DIAGNOSIS — S98139A Complete traumatic amputation of one unspecified lesser toe, initial encounter: Secondary | ICD-10-CM

## 2013-01-19 DIAGNOSIS — Z89422 Acquired absence of other left toe(s): Secondary | ICD-10-CM | POA: Insufficient documentation

## 2013-01-19 LAB — CBC WITH DIFFERENTIAL/PLATELET
BASOS PCT: 0.6 % (ref 0.0–3.0)
Basophils Absolute: 0.1 10*3/uL (ref 0.0–0.1)
EOS PCT: 3.8 % (ref 0.0–5.0)
Eosinophils Absolute: 0.3 10*3/uL (ref 0.0–0.7)
HCT: 39.8 % (ref 39.0–52.0)
Hemoglobin: 13.5 g/dL (ref 13.0–17.0)
LYMPHS ABS: 1.4 10*3/uL (ref 0.7–4.0)
Lymphocytes Relative: 15.3 % (ref 12.0–46.0)
MCHC: 33.8 g/dL (ref 30.0–36.0)
MCV: 85 fl (ref 78.0–100.0)
Monocytes Absolute: 0.5 10*3/uL (ref 0.1–1.0)
Monocytes Relative: 5.6 % (ref 3.0–12.0)
NEUTROS ABS: 6.8 10*3/uL (ref 1.4–7.7)
Neutrophils Relative %: 74.7 % (ref 43.0–77.0)
Platelets: 193 10*3/uL (ref 150.0–400.0)
RBC: 4.68 Mil/uL (ref 4.22–5.81)
RDW: 16.7 % — AB (ref 11.5–14.6)
WBC: 9.1 10*3/uL (ref 4.5–10.5)

## 2013-01-19 LAB — HEPATIC FUNCTION PANEL
ALBUMIN: 4.1 g/dL (ref 3.5–5.2)
ALK PHOS: 40 U/L (ref 39–117)
ALT: 22 U/L (ref 0–53)
AST: 18 U/L (ref 0–37)
Bilirubin, Direct: 0.1 mg/dL (ref 0.0–0.3)
Total Bilirubin: 0.6 mg/dL (ref 0.3–1.2)
Total Protein: 7.5 g/dL (ref 6.0–8.3)

## 2013-01-19 LAB — LIPID PANEL
Cholesterol: 140 mg/dL (ref 0–200)
HDL: 28.8 mg/dL — AB (ref >39.00)
LDL Cholesterol: 73 mg/dL (ref 0–99)
Total CHOL/HDL Ratio: 5
Triglycerides: 192 mg/dL — ABNORMAL HIGH (ref 0.0–149.0)
VLDL: 38.4 mg/dL (ref 0.0–40.0)

## 2013-01-19 LAB — BASIC METABOLIC PANEL
BUN: 20 mg/dL (ref 6–23)
CO2: 27 meq/L (ref 19–32)
CREATININE: 1 mg/dL (ref 0.4–1.5)
Calcium: 9.6 mg/dL (ref 8.4–10.5)
Chloride: 104 mEq/L (ref 96–112)
GFR: 80.72 mL/min (ref >60.00)
GLUCOSE: 160 mg/dL — AB (ref 70–99)
Potassium: 4.3 mEq/L (ref 3.5–5.1)
Sodium: 140 mEq/L (ref 135–145)

## 2013-01-19 LAB — T4, FREE: Free T4: 0.82 ng/dL (ref 0.60–1.60)

## 2013-01-19 LAB — HM DIABETES FOOT EXAM

## 2013-01-19 LAB — TSH: TSH: 1.09 u[IU]/mL (ref 0.35–5.50)

## 2013-01-19 NOTE — Assessment & Plan Note (Signed)
UTD on colon Urology for PSA follow up Discussed lifestyle improvements

## 2013-01-19 NOTE — Patient Instructions (Signed)
Please set up your eye exam.  Diabetes and Exercise Exercising regularly is important. It is not just about losing weight. It has many health benefits, such as:  Improving your overall fitness, flexibility, and endurance.  Increasing your bone density.  Helping with weight control.  Decreasing your body fat.  Increasing your muscle strength.  Reducing stress and tension.  Improving your overall health. People with diabetes who exercise gain additional benefits because exercise:  Reduces appetite.  Improves the body's use of blood sugar (glucose).  Helps lower or control blood glucose.  Decreases blood pressure.  Helps control blood lipids (such as cholesterol and triglycerides).  Improves the body's use of the hormone insulin by:  Increasing the body's insulin sensitivity.  Reducing the body's insulin needs.  Decreases the risk for heart disease because exercising:  Lowers cholesterol and triglycerides levels.  Increases the levels of good cholesterol (such as high-density lipoproteins [HDL]) in the body.  Lowers blood glucose levels. YOUR ACTIVITY PLAN  Choose an activity that you enjoy and set realistic goals. Your health care provider or diabetes educator can help you make an activity plan that works for you. You can break activities into 2 or 3 sessions throughout the day. Doing so is as good as one long session. Exercise ideas include:  Taking the dog for a walk.  Taking the stairs instead of the elevator.  Dancing to your favorite song.  Doing your favorite exercise with a friend. RECOMMENDATIONS FOR EXERCISING WITH TYPE 1 OR TYPE 2 DIABETES   Check your blood glucose before exercising. If blood glucose levels are greater than 240 mg/dL, check for urine ketones. Do not exercise if ketones are present.  Avoid injecting insulin into areas of the body that are going to be exercised. For example, avoid injecting insulin into:  The arms when playing  tennis.  The legs when jogging.  Keep a record of:  Food intake before and after you exercise.  Expected peak times of insulin action.  Blood glucose levels before and after you exercise.  The type and amount of exercise you have done.  Review your records with your health care provider. Your health care provider will help you to develop guidelines for adjusting food intake and insulin amounts before and after exercising.  If you take insulin or oral hypoglycemic agents, watch for signs and symptoms of hypoglycemia. They include:  Dizziness.  Shaking.  Sweating.  Chills.  Confusion.  Drink plenty of water while you exercise to prevent dehydration or heat stroke. Body water is lost during exercise and must be replaced.  Talk to your health care provider before starting an exercise program to make sure it is safe for you. Remember, almost any type of activity is better than none. Document Released: 03/16/2003 Document Revised: 08/26/2012 Document Reviewed: 06/02/2012 [facility] Patient Information 2014 [place]. Diabetes Meal Planning Guide The diabetes meal planning guide is a tool to help you plan your meals and snacks. It is important for people with diabetes to manage their blood glucose (sugar) levels. Choosing the right foods and the right amounts throughout your day will help control your blood glucose. Eating right can even help you improve your blood pressure and reach or maintain a healthy weight. CARBOHYDRATE COUNTING MADE EASY When you eat carbohydrates, they turn to sugar. This raises your blood glucose level. Counting carbohydrates can help you control this level so you feel better. When you plan your meals by counting carbohydrates, you can have more flexibility in  what you eat and balance your medicine with your food intake. Carbohydrate counting simply means adding up the total amount of carbohydrate grams in your meals and snacks. Try to eat about the same  amount at each meal. Foods with carbohydrates are listed below. Each portion below is 1 carbohydrate serving or 15 grams of carbohydrates. Ask your dietician how many grams of carbohydrates you should eat at each meal or snack. Grains and Starches  1 slice bread.   English muffin or hotdog/hamburger bun.   cup cold cereal (unsweetened).   cup cooked pasta or rice.   cup starchy vegetables (corn, potatoes, peas, beans, winter squash).  1 tortilla (6 inches).   bagel.  1 waffle or pancake (size of a CD).   cup cooked cereal.  4 to 6 small crackers. *Whole grain is recommended. Fruit  1 cup fresh unsweetened berries, melon, papaya, pineapple.  1 small fresh fruit.   banana or mango.   cup fruit juice (4 oz unsweetened).   cup canned fruit in natural juice or water.  2 tbs dried fruit.  12 to 15 grapes or cherries. Milk and Yogurt  1 cup fat-free or 1% milk.  1 cup soy milk.  6 oz light yogurt with sugar-free sweetener.  6 oz low-fat soy yogurt.  6 oz plain yogurt. Vegetables  1 cup raw or  cup cooked is counted as 0 carbohydrates or a "free" food.  If you eat 3 or more servings at 1 meal, count them as 1 carbohydrate serving. Other Carbohydrates   oz chips or pretzels.   cup ice cream or frozen yogurt.   cup sherbet or sorbet.  2 inch square cake, no frosting.  1 tbs honey, sugar, jam, jelly, or syrup.  2 small cookies.  3 squares of graham crackers.  3 cups popcorn.  6 crackers.  1 cup broth-based soup.  Count 1 cup casserole or other mixed foods as 2 carbohydrate servings.  Foods with less than 20 calories in a serving may be counted as 0 carbohydrates or a "free" food. You may want to purchase a book or computer software that lists the carbohydrate gram counts of different foods. In addition, the nutrition facts panel on the labels of the foods you eat are a good source of this information. The label will tell you how big the  serving size is and the total number of carbohydrate grams you will be eating per serving. Divide this number by 15 to obtain the number of carbohydrate servings in a portion. Remember, 1 carbohydrate serving equals 15 grams of carbohydrate. SERVING SIZES Measuring foods and serving sizes helps you make sure you are getting the right amount of food. The list below tells how big or small some common serving sizes are.  1 oz.........4 stacked dice.  3 oz........Marland KitchenDeck of cards.  1 tsp.......Marland KitchenTip of little finger.  1 tbs......Marland KitchenMarland KitchenThumb.  2 tbs.......Marland KitchenGolf ball.   cup......Marland KitchenHalf of a fist.  1 cup.......Marland KitchenA fist. SAMPLE DIABETES MEAL PLAN Below is a sample meal plan that includes foods from the grain and starches, dairy, vegetable, fruit, and meat groups. A dietician can individualize a meal plan to fit your calorie needs and tell you the number of servings needed from each food group. However, controlling the total amount of carbohydrates in your meal or snack is more important than making sure you include all of the food groups at every meal. You may interchange carbohydrate containing foods (dairy, starches, and fruits). The meal plan below is  an example of a 2000 calorie diet using carbohydrate counting. This meal plan has 17 carbohydrate servings. Breakfast  1 cup oatmeal (2 carb servings).   cup light yogurt (1 carb serving).  1 cup blueberries (1 carb serving).   cup almonds. Snack  1 large apple (2 carb servings).  1 low-fat string cheese stick. Lunch  Chicken breast salad.  1 cup spinach.   cup chopped tomatoes.  2 oz chicken breast, sliced.  2 tbs low-fat [place] dressing.  12 whole-wheat crackers (2 carb servings).  12 to 15 grapes (1 carb serving).  1 cup low-fat milk (1 carb serving). Snack  1 cup carrots.   cup hummus (1 carb serving). Dinner  3 oz broiled salmon.  1 cup brown rice (3 carb servings). Snack  1  cups steamed broccoli (1 carb  serving) drizzled with 1 tsp olive oil and lemon juice.  1 cup light pudding (2 carb servings). DIABETES MEAL PLANNING WORKSHEET Your dietician can use this worksheet to help you decide how many servings of foods and what types of foods are right for you.  BREAKFAST Food Group and Servings / Carb Servings Grain/Starches __________________________________ Dairy __________________________________________ Vegetable ______________________________________ Fruit ___________________________________________ Meat __________________________________________ Fat ____________________________________________ LUNCH Food Group and Servings / Carb Servings Grain/Starches ___________________________________ Dairy ___________________________________________ Fruit ____________________________________________ Meat ___________________________________________ Fat _____________________________________________ [name] Food Group and Servings / Carb Servings Grain/Starches ___________________________________ Dairy ___________________________________________ Fruit ____________________________________________ Meat ___________________________________________ Fat _____________________________________________ SNACKS Food Group and Servings / Carb Servings Grain/Starches ___________________________________ Dairy ___________________________________________ Vegetable _______________________________________ Fruit ____________________________________________ Meat ___________________________________________ Fat _____________________________________________ DAILY TOTALS Starches _________________________ Vegetable ________________________ Fruit ____________________________ Dairy ____________________________ Meat ____________________________ Fat ______________________________ Document Released: 09/20/2004 Document Revised: 03/18/2011 Document Reviewed: 08/01/2008 [employer] Patient Information 2014 [place]

## 2013-01-19 NOTE — Patient Instructions (Signed)
Patient Instructions  Please keep Levemir at 65 units at bedtime Please change Humalog as follows: 18 units with a smaller meal and 22 units with a larger meal. Add 3 units with dinner. Continue the following Humalog sliding scale: - 150-175: + 1 unit  - 176-200: + 2 units  - 201-225: + 3 units  - 226-250: + 4 units  - 251-275: + 5 units - 276-300: + 6 units Please return in 1 month with your sugar log.  Please call me with sugars <80 or >200 consistently.

## 2013-01-19 NOTE — Assessment & Plan Note (Signed)
Lab Results  Component Value Date   HGBA1C 7.6* 12/30/2012   Better control now Continues to work with Dr [name]

## 2013-01-19 NOTE — Assessment & Plan Note (Signed)
Has been quiet On appropriate Rx 

## 2013-01-19 NOTE — Assessment & Plan Note (Signed)
No cholesterol meds since LDL good

## 2013-01-19 NOTE — Assessment & Plan Note (Signed)
Healing well Follows with Dr Marlou Sa still

## 2013-01-19 NOTE — Progress Notes (Signed)
Patient ID: AKIEL FENNELL, male   DOB: 02-15-1950, 63 y.o.   MRN: [id number]  HPI: VIAAN KNIPPENBERG is a 63 y.o.-year-old male, returning for f/u for DM2, dx 2008, insulin-dependent since dx, uncontrolled, with complications (peripheral neuropathy, CAD-status post CABG in 2008, PVD, history of diabetic foot ulcer, history of retinal detachment in 2002-3). He is accompanied by his fiance, who offers parts of the history. Lats visit 4 mo ago.  He had amputation of his big toe in 12/30/2012 2/2 infection >> osteomyelitis.  Last hemoglobin A1c was: Lab Results  Component Value Date   HGBA1C 7.6* 12/30/2012   HGBA1C 8.7* 10/05/2012   HGBA1C 9.8* 06/30/2012   Pt is on a regimen of: - Metformin 1000 mg po bid - Levemir 65 units hs  (using this once a day instead of 65 units 2x a day!), but sugars improved... - Humalog  18 units for a smaller meal  22 units for a larger meal We stopped Glipizide 5 mg at last visit.  Pt checks his sugars 3x a day and they are - much improved in the last 3 weeks after his hospitalization - reviewed sugar log: - am: 190-260.>> 160s recently >> 140-183, mostly 140s - before lunch: 165-198 >> 82-145 - before dinner: 159-182 >> 73-163 - bedtime: 162-246 >> 112-254 - last 3 weeks No lows.  ? hypoglycemia awareness. Highest sugar was 398 x 1.  Pt's meals are: - Breakfast: bacon, eggs, tomato sandwich (sometimes no bread) - Lunch: meat + 2 veggies (no potatoes) - Dinner: meat + 2 veggies - Snacks: 1 a day - 2x a week, icecream  Uses Splenda in tea and coffee. Not many sodas: 2 a week; drinks unsweet tea; drinks water  He had DM education in 2008. He improved his diet after his hospitalization.  - no CKD, last BUN/creatinine:  Lab Results  Component Value Date   BUN 20 01/19/2013   CREATININE 1.0 01/19/2013  On Losartan. - last set of lipids: Lab Results  Component Value Date   CHOL 140 01/19/2013   HDL 28.80* 01/19/2013   LDLCALC 73 01/19/2013   LDLDIRECT 84.3  12/31/2011   TRIG 192.0* 01/19/2013   CHOLHDL 5 01/19/2013  Not on a statin as intolerant. - last eye exam was > 1 year ago >. Dr Antoine Primas. No DR.  - + numbness and tingling in his feet. No sensation in his feet.  He also has a history of Prostate cancer, s/p 25 RxTx, now with radioactive seeds. Also, hyperlipidemia, hypertension, sleep apnea, osteoarthrosis.   ROS: Constitutional: no weight gain/loss, no fatigue, no subjective hyperthermia/hypothermia Eyes: no blurry vision, no xerophthalmia ENT: no sore throat, no nodules palpated in throat, no dysphagia/odynophagia, no hoarseness; + decreased hearing Cardiovascular: no CP/SOB/palpitations/+ leg swelling Respiratory: no cough/SOB Gastrointestinal: no N/V/D/no C Musculoskeletal: + muscle/+ joint aches Skin: no rashes Neurological: no tremors/numbness/tingling/dizziness Psychiatric: no depression/anxiety  I reviewed pt's medications, allergies, PMH, social hx, family hx and no changes required, except as mentioned above.  PE: BP 124/62  Pulse 89  Temp(Src) 97.5 F (36.4 C) (Oral)  Resp 16  Wt 287 lb 9.6 oz (130.455 kg)  SpO2 97% Wt Readings from Last 3 Encounters:  01/19/13 287 lb 9.6 oz (130.455 kg)  01/19/13 287 lb (130.182 kg)  12/30/12 288 lb 12.8 oz (131 kg)   Constitutional: obese, in NAD, ruddy complexion Eyes: PERRLA, EOMI, no exophthalmos ENT: moist mucous membranes, no thyromegaly, no cervical lymphadenopathy; large neck Cardiovascular: RRR, No MRG, +  bilateral leg edema, pitting. Boot on left leg. Respiratory: CTA B Gastrointestinal: abdomen soft, NT, ND, BS+ Musculoskeletal: no deformities, strength intact in all 4 Skin: moist, warm, stasis dermatitis in bilateral legs Neurological: mild tremor with outstretched hands, DTR normal in all 4  ASSESSMENT: 1. DM2, insulin-dependent, uncontrolled, with complications - peripheral neuropathy - CAD-status post stent in 2005, then 5v CABG in 2008 - PVD - history  of diabetic foot ulcer - history of retinal detachment in 2002-3  PLAN:  1. Patient with several years of DM2, recently more controlled diabetes, on basal -bolus insulin regimen + metformin, which became insufficient -  I suggested to:  Patient Instructions  Please keep Levemir at 65 units 2x daily Please change Humalog as follows: 18 units with a smaller meal and 22 units with a larger meal. Add 3 units with dinner. Continue the following Humalog sliding scale: - 150-175: + 1 unit  - 176-200: + 2 units  - 201-225: + 3 units  - 226-250: + 4 units  - 251-275: + 5 units - 276-300: + 6 units Please return in 1 month with your sugar log.  Please call me with sugars <80 or >200 consistently. - continue checking sugars at different times of the day - check 3 times a day, rotating checks - also check at bedtime - advised for yearly eye exams >> need to schedule a new exam - given flu vaccine this season - Return to clinic in one month with sugar log

## 2013-01-19 NOTE — Assessment & Plan Note (Signed)
Follows with Dr Borden 

## 2013-01-19 NOTE — Progress Notes (Signed)
Subjective:    Patient ID: [name], male    DOB: 09/22/1950, 63 y.o.   MRN: [id number]  HPI Here for physical Girlfriend with him  Recovering from toe amputation Able to walk without boot at home Needs to use walker for another 2 weeks  Checks sugars tid  Adjusts humalog based on this No hypoglycemic reactions--lowest is 83 Overdue for eye exam  Still follows with Dr Alinda Money for prostate cancer No active treatment for now  Current Outpatient Prescriptions on File Prior to Visit  Medication Sig Dispense Refill  . aspirin 325 MG tablet Take 325 mg by mouth daily.        . brimonidine (ALPHAGAN) 0.15 % ophthalmic solution Place 1 drop into both eyes daily.        . carvedilol (COREG) 25 MG tablet Take 1 tablet (25 mg total) by mouth 2 (two) times daily with a meal.  180 tablet  3  . insulin detemir (LEVEMIR) 100 UNIT/ML injection Inject 0.65 mLs (65 Units total) into the skin at bedtime.  20 mL  11  . insulin lispro (HUMALOG PEN) 100 UNIT/ML SOPN Inject up to 25 units into the skin before each of the 3 meals. Dx code: 250.60.  25 pen  3  . losartan-hydrochlorothiazide (HYZAAR) 50-12.5 MG per tablet Take 1 tablet by mouth daily.      . metFORMIN (GLUCOPHAGE) 1000 MG tablet Take 1,000 mg by mouth 2 (two) times daily with a meal.      . Multiple Vitamin (MULTIVITAMIN) tablet Take 1 tablet by mouth daily.        Marland Kitchen oxyCODONE (OXY IR/ROXICODONE) 5 MG immediate release tablet Take 1 tablet (5 mg total) by mouth every 4 (four) hours as needed for moderate pain.  65 tablet  0   No current facility-administered medications on file prior to visit.    Allergies  Allergen Reactions  . Atorvastatin     REACTION: myalgias  . Ezetimibe Other (See Comments)    Body ache  . Simvastatin Other (See Comments)    Body ache    Past Medical History  Diagnosis Date  . Malignant neoplasm of prostate   . Coronary atherosclerosis of unspecified type of vessel, native or graft   . Type II or  unspecified type diabetes mellitus with neurological manifestations, not stated as uncontrolled   . Impotence of organic origin   . Unspecified glaucoma   . Internal hemorrhoids without mention of complication   . Other and unspecified hyperlipidemia   . Mononeuritis of unspecified site   . Osteoarthrosis, unspecified whether generalized or localized, unspecified site   . Personal history of colonic polyps   . Routine general medical examination at a health care facility   . Hemorrhage of rectum and anus   . Unspecified sleep apnea   . Unspecified venous (peripheral) insufficiency   . Personal history of gallstones   . Personal history of diabetic foot ulcer     saw wound center, resolved 05/08/2010    Past Surgical History  Procedure Laterality Date  . Cardiac catheterization  1998    Negative  . Thrombosed vein  1993    Right leg  . Kidney stone surgery  04/1993  . Retinal detachment surgery  2002-2003  . Rca stents  04/2003    EF 55%  . Coronary artery bypass graft  09/2005    Post op AFIB  . Insertion prostate radiation seed  2009    RT and seeds  for prostate cancer  . Amputation Left 12/30/2012    Procedure: AMPUTATION RAY ;  Surgeon: Meredith Pel, MD;  Location: [facility];  Service: Orthopedics;  Laterality: Left;  LEFT GREAT TOE RAY AMPUTATION    Family History  Problem Relation Age of Onset  . Lung cancer Father   . Multiple sclerosis Mother   . Heart attack      paternal aunts and uncles    History   Social History  . Marital Status: Widowed    Spouse Name: N/A    Number of Children: 3  . Years of Education: N/A   Occupational History  . Heavy equipment mechanic--retired    Social History Main Topics  . Smoking status: Former Smoker    Types: Cigars    Quit date: 01/07/2001  . Smokeless tobacco: Never Used  . Alcohol Use: Yes     Comment: rare  . Drug Use: No  . Sexual Activity: Yes    Partners: Female   Other Topics Concern  . Not on file    Social History Narrative   Regular exercise: seldom   Caffeine use: coffee daily   Review of Systems  Constitutional: Negative for fatigue and unexpected weight change.       Wears seat belt  HENT: Positive for hearing loss. Negative for dental problem and tinnitus.        Regular with dentist  Eyes: Negative for visual disturbance.       No diplopia or unilateral vision loss  Respiratory: Negative for cough, chest tightness and shortness of breath.   Cardiovascular: Positive for leg swelling. Negative for chest pain and palpitations.  Gastrointestinal: Negative for nausea, vomiting, abdominal pain, constipation and blood in stool.       No heartburn  Endocrine: Negative for cold intolerance and heat intolerance.  Genitourinary: Negative for urgency, frequency and difficulty urinating.  Musculoskeletal: Positive for arthralgias. Negative for back pain and joint swelling.       Chronic shoulder pain--rarely takes aleve  Skin: Negative for rash.  Allergic/Immunologic: Negative for environmental allergies and immunocompromised state.  Neurological: Positive for numbness. Negative for dizziness, syncope, weakness, light-headedness and headaches.  Hematological: Negative for adenopathy. Bruises/bleeds easily.  Psychiatric/Behavioral: Negative for sleep disturbance and dysphoric mood. The patient is not nervous/anxious.        Objective:   Physical Exam  Constitutional: He is oriented to person, place, and time. He appears well-developed and well-nourished. No distress.  HENT:  Head: Normocephalic and atraumatic.  Right Ear: External ear normal.  Left Ear: External ear normal.  Mouth/Throat: Oropharynx is clear and moist. No oropharyngeal exudate.  Eyes: Conjunctivae and EOM are normal. Pupils are equal, round, and reactive to light.  Neck: Normal range of motion. Neck supple. No thyromegaly present.  Cardiovascular: Normal rate, regular rhythm, normal heart sounds and intact  distal pulses.  Exam reveals no gallop.   No murmur heard. Pulmonary/Chest: Effort normal and breath sounds normal. No respiratory distress. He has no wheezes. He has no rales.  Abdominal: Soft. There is no tenderness.  Musculoskeletal: He exhibits edema. He exhibits no tenderness.  Mild edema  Lymphadenopathy:    He has no cervical adenopathy.  Neurological: He is alert and oriented to person, place, and time.  Decreased sensation in feet  Skin: No rash noted.  Left great toe amputation site looks clean and dry Slight redness  Psychiatric: He has a normal mood and affect.  Assessment & Plan:

## 2013-01-20 ENCOUNTER — Encounter: Payer: Self-pay | Admitting: *Deleted

## 2013-02-22 ENCOUNTER — Encounter: Payer: Self-pay | Admitting: Internal Medicine

## 2013-02-22 ENCOUNTER — Ambulatory Visit (INDEPENDENT_AMBULATORY_CARE_PROVIDER_SITE_OTHER): Payer: No Typology Code available for payment source | Admitting: Internal Medicine

## 2013-02-22 VITALS — BP 124/62 | HR 90 | Temp 97.6°F | Resp 12 | Wt 293.0 lb

## 2013-02-22 DIAGNOSIS — E1149 Type 2 diabetes mellitus with other diabetic neurological complication: Secondary | ICD-10-CM

## 2013-02-22 NOTE — Progress Notes (Signed)
Patient ID: [name], male   DOB: Oct 12, 1950, 63 y.o.   MRN: [id number]  HPI: [name] is a 63 y.o.-year-old male, returning for f/u for DM2, dx 2008, insulin-dependent since dx, uncontrolled, with complications (peripheral neuropathy, CAD-status post CABG in 2008, PVD, history of diabetic foot ulcer, history of retinal detachment in 2002-3). He is accompanied by his fiance, who offers parts of the history. Last visit 1 mo ago.  He had a kidney stone 2 weeks ago >> sugars higher in the next week, now back down..   Last hemoglobin A1c was: Lab Results  Component Value Date   HGBA1C 7.6* 12/30/2012   HGBA1C 8.7* 10/05/2012   HGBA1C 9.8* 06/30/2012   Pt is on a regimen of: - Metformin 1000 mg po bid - Levemir 65 units hs ( was on 65 units 2x a day!), but sugars improved... - Humalog  18 units for a smaller meal  22 units for a larger meal + Add 3 units for dinner We stopped Glipizide 5 mg.  Pt checks his sugars 3x a day and they are - improved except 2 weeks ago - reviewed sugar log: - am: 190-260.>> 160s recently >> 140-183, mostly 140s >> 130-180 - before lunch: 165-198 >> 82-145 >> 135-193 - before dinner: 159-182 >> 73-163 >> 107-180 - bedtime: 162-246 >> 112-254 - last 3 weeks >> n/c No lows.  ? hypoglycemia awareness. Highest sugar was 249 (after cheese cake) x 1.  Pt's meals are: - Breakfast: bacon, eggs, tomato sandwich (sometimes no bread) - Lunch: meat + 2 veggies (no potatoes) - Dinner: meat + 2 veggies - Snacks: 1 a day - 2x a week, icecream  Uses Splenda in tea and coffee. Not many sodas: 2 a week; drinks unsweet tea; drinks water  He had DM education in 2008. He improved his diet after his hospitalization.  - no CKD, last BUN/creatinine:  Lab Results  Component Value Date   BUN 20 01/19/2013   CREATININE 1.0 01/19/2013  On Losartan. - last set of lipids: Lab Results  Component Value Date   CHOL 140 01/19/2013   HDL 28.80* 01/19/2013   LDLCALC 73  01/19/2013   LDLDIRECT 84.3 12/31/2011   TRIG 192.0* 01/19/2013   CHOLHDL 5 01/19/2013  Not on a statin as intolerant. - last eye exam was > 1 year ago >. Dr Antoine Primas. No DR.  - + numbness and tingling in his feet. No sensation in his feet.  He also has a history of Prostate cancer, s/p 25 RxTx, now with radioactive seeds. Also, hyperlipidemia, hypertension, sleep apnea, osteoarthrosis. He had amputation of his big toe in 12/30/2012 2/2 infection >> osteomyelitis.  ROS: Constitutional: + weight gain, no fatigue, no subjective hyperthermia/hypothermia Eyes: no blurry vision, no xerophthalmia ENT: no sore throat, no nodules palpated in throat, no dysphagia/odynophagia, no hoarseness; + decreased hearing Cardiovascular: no CP/SOB/palpitations/+ leg swelling Respiratory: no cough/SOB Gastrointestinal: no N/V/D/no C Musculoskeletal: + muscle/+ joint aches Skin: no rashes Neurological: no tremors/numbness/tingling/dizziness Psychiatric: no depression/anxiety  I reviewed pt's medications, allergies, PMH, social hx, family hx and no changes required, except as mentioned above.  PE: BP 124/62  Pulse 90  Temp(Src) 97.6 F (36.4 C) (Oral)  Resp 12  Wt 293 lb (132.904 kg)  SpO2 97% Wt Readings from Last 3 Encounters:  02/22/13 293 lb (132.904 kg)  01/19/13 287 lb 9.6 oz (130.455 kg)  01/19/13 287 lb (130.182 kg)   Constitutional: obese, in NAD, ruddy complexion Eyes: PERRLA,  EOMI, no exophthalmos ENT: moist mucous membranes, no thyromegaly, no cervical lymphadenopathy; large neck Cardiovascular: RRR, No MRG, + bilateral leg edema, pitting. Boot on left leg. Respiratory: CTA B Gastrointestinal: abdomen soft, NT, ND, BS+ Musculoskeletal: no deformities, strength intact in all 4 Skin: moist, warm, stasis dermatitis in bilateral legs Neurological: mild tremor with outstretched hands, DTR normal in all 4  ASSESSMENT: 1. DM2, insulin-dependent, uncontrolled, with complications -  peripheral neuropathy - CAD-status post stent in 2005, then 5v CABG in 2008 - PVD - history of diabetic foot ulcer - h/o amputation of his big toe in 12/30/2012 2/2 infection >> osteomyelitis. - history of retinal detachment in 2002-3  PLAN:  1. Patient with several years of DM2, recently more controlled diabetes (except 1 week after his kidney stone episode), on basal -bolus insulin regimen + metformin.  -  I suggested to:  Patient Instructions  Please increase Levemir to 75 units Please change Humalog as follows: 18 units with a smaller meal and 22 units with a larger meal. Add 3 units with dinner. Continue the following Humalog sliding scale: - 150-175: + 1 unit  - 176-200: + 2 units  - 201-225: + 3 units  - 226-250: + 4 units  - 251-275: + 5 units - 276-300: + 6 units Please return in 1 month with your sugar log.  Please call me with sugars <80 or >200 consistently. - continue checking sugars at different times of the day - check 3 times a day, rotating checks - also check at bedtime - advised for yearly eye exams >> need to schedule a new exam! - given flu vaccine this season - Return to clinic in 1.5 month with sugar log

## 2013-02-22 NOTE — Patient Instructions (Signed)
Patient Instructions  Please increase Levemir to 75 units Please continue the Humalog as follows: 18 units with a smaller meal and 22 units with a larger meal. Add 3 units with dinner. Continue the following Humalog sliding scale: - 150-175: + 1 unit  - 176-200: + 2 units  - 201-225: + 3 units  - 226-250: + 4 units  - 251-275: + 5 units - 276-300: + 6 units Please return in 1 month with your sugar log.  Please call me with sugars <80 or >200 consistently.

## 2013-03-06 ENCOUNTER — Other Ambulatory Visit: Payer: Self-pay | Admitting: Internal Medicine

## 2013-03-09 ENCOUNTER — Encounter: Payer: No Typology Code available for payment source | Admitting: Internal Medicine

## 2013-03-15 ENCOUNTER — Ambulatory Visit (INDEPENDENT_AMBULATORY_CARE_PROVIDER_SITE_OTHER): Payer: No Typology Code available for payment source | Admitting: Internal Medicine

## 2013-03-15 ENCOUNTER — Encounter: Payer: Self-pay | Admitting: Internal Medicine

## 2013-03-15 VITALS — BP 130/70 | HR 85 | Temp 97.8°F | Wt 294.0 lb

## 2013-03-15 DIAGNOSIS — I872 Venous insufficiency (chronic) (peripheral): Secondary | ICD-10-CM

## 2013-03-15 MED ORDER — FUROSEMIDE 40 MG PO TABS: 40.0000 mg | ORAL_TABLET | Freq: Every day | ORAL | Status: DC | PRN

## 2013-03-15 NOTE — Patient Instructions (Addendum)
Please use support socks starting first thing in the morning--until bedtime. Please use the furosemide when your feet are swollen--even in the morning. Hopefully you don't need it more than once or twice a week.  Sodium-Controlled Diet Sodium is a mineral. It is found in many foods. Sodium may be found naturally or added during the making of a food. The most common form of sodium is salt, which is made up of sodium and chloride. Reducing your sodium intake involves changing your eating habits. The following guidelines will help you reduce the sodium in your diet:  Stop using the salt shaker.  Use salt sparingly in cooking and baking.  Substitute with sodium-free seasonings and spices.  Do not use a salt substitute (potassium chloride) without your caregiver's permission.  Include a variety of fresh, unprocessed foods in your diet.  Limit the use of processed and convenience foods that are high in sodium. USE THE FOLLOWING FOODS SPARINGLY: Breads/Starches  Commercial bread stuffing, commercial pancake or waffle mixes, coating mixes. Waffles. Croutons. Prepared (boxed or frozen) potato, rice, or noodle mixes that contain salt or sodium. Salted [place] fries or hash browns. Salted popcorn, breads, crackers, chips, or snack foods. Vegetables  Vegetables canned with salt or prepared in cream, butter, or cheese sauces. Sauerkraut. Tomato or vegetable juices canned with salt.  Fresh vegetables are allowed if rinsed thoroughly. Fruit  Fruit is okay to eat. Meat and Meat Substitutes  Salted or smoked meats, such as bacon or Canadian bacon, chipped or corned beef, hot dogs, salt pork, luncheon meats, pastrami, ham, or sausage. Canned or smoked fish, poultry, or meat. Processed cheese or cheese spreads, blue or Roquef[place] cheese. Battered or frozen fish products. Prepared spaghetti sauce. Baked beans. Reuben sandwiches. Salted nuts. Caviar. Milk  Limit buttermilk to 1 cup per week. Soups and  Combination Foods  Bouillon cubes, canned or dried soups, broth, consomm. Convenience (frozen or packaged) dinners with more than 600 mg sodium. Pot pies, pizza, Asian food, fast food cheeseburgers, and specialty sandwiches. Desserts and Sweets  Regular (salted) desserts, pie, commercial fruit snack pies, commercial snack cakes, canned puddings.  Eat desserts and sweets in moderation. Fats and Oils  Gravy mixes or canned gravy. No more than 1 to 2 tbs of salad dressing. Chip dips.  Eat fats and oils in moderation. Beverages  See those listed under the vegetables and milk groups. Condiments  Ketchup, mustard, meat sauces, salsa, regular (salted) and lite soy sauce or mustard. Dill pickles, olives, meat tenderizer. Prepared horseradish or pickle relish. Dutch-processed cocoa. Baking powder or baking soda used medicinally. Worcestershire sauce. "Light" salt. Salt substitute, unless approved by your caregiver. Document Released: 06/15/2001 Document Revised: 03/18/2011 Document Reviewed: 01/16/2009 [facility] Patient Information 2014 [place], [place].

## 2013-03-15 NOTE — Progress Notes (Signed)
Subjective:    Patient ID: [name], male    DOB: 07-21-1950, 63 y.o.   MRN: [id number]  HPI Here with girlfriend  Has had some swelling in the left ankle and foot for 2 weeks or so Surgeon had been satisfied with response and healing Has been using elastic wrap to reduce swelling  Has noticed some AM swelling too---then worsening as the day goes on Some tenderness on the inside of the ankle and up under the arch Walking okay on it  Current Outpatient Prescriptions on File Prior to Visit  Medication Sig Dispense Refill  . aspirin 325 MG tablet Take 325 mg by mouth daily.        . B-D UF III MINI PEN NEEDLES 31G X 5 MM MISC       . brimonidine (ALPHAGAN) 0.15 % ophthalmic solution Place 1 drop into both eyes daily.        . carvedilol (COREG) 25 MG tablet TAKE 1 TABLET TWICE A DAY WITH A MEAL  180 tablet  2  . FREESTYLE INSULINX TEST test strip       . insulin detemir (LEVEMIR) 100 UNIT/ML injection Inject 0.65 mLs (65 Units total) into the skin at bedtime.  20 mL  11  . insulin lispro (HUMALOG PEN) 100 UNIT/ML SOPN Inject up to 25 units into the skin before each of the 3 meals. Dx code: 250.60.  25 pen  3  . losartan-hydrochlorothiazide (HYZAAR) 50-12.5 MG per tablet TAKE 1 TABLET DAILY  90 tablet  2  . metFORMIN (GLUCOPHAGE) 1000 MG tablet TAKE 1 TABLET TWICE A DAY  180 tablet  2  . Multiple Vitamin (MULTIVITAMIN) tablet Take 1 tablet by mouth daily.        Marland Kitchen oxyCODONE (OXY IR/ROXICODONE) 5 MG immediate release tablet Take 1 tablet (5 mg total) by mouth every 4 (four) hours as needed for moderate pain.  65 tablet  0   No current facility-administered medications on file prior to visit.    Allergies  Allergen Reactions  . Atorvastatin     REACTION: myalgias  . Ezetimibe Other (See Comments)    Body ache  . Simvastatin Other (See Comments)    Body ache    Past Medical History  Diagnosis Date  . Malignant neoplasm of prostate   . Coronary atherosclerosis of  unspecified type of vessel, native or graft   . Type II or unspecified type diabetes mellitus with neurological manifestations, not stated as uncontrolled   . Impotence of organic origin   . Unspecified glaucoma   . Internal hemorrhoids without mention of complication   . Other and unspecified hyperlipidemia   . Mononeuritis of unspecified site   . Osteoarthrosis, unspecified whether generalized or localized, unspecified site   . Personal history of colonic polyps   . Routine general medical examination at a health care facility   . Hemorrhage of rectum and anus   . Unspecified sleep apnea   . Unspecified venous (peripheral) insufficiency   . Personal history of gallstones   . Personal history of diabetic foot ulcer     saw wound center, resolved 05/08/2010    Past Surgical History  Procedure Laterality Date  . Cardiac catheterization  1998    Negative  . Thrombosed vein  1993    Right leg  . Kidney stone surgery  04/1993  . Retinal detachment surgery  2002-2003  . Rca stents  04/2003    EF 55%  . Coronary artery  bypass graft  09/2005    Post op AFIB  . Insertion prostate radiation seed  2009    RT and seeds for prostate cancer  . Amputation Left 12/30/2012    Procedure: AMPUTATION RAY ;  Surgeon: Meredith Pel, MD;  Location: [facility];  Service: Orthopedics;  Laterality: Left;  LEFT GREAT TOE RAY AMPUTATION    Family History  Problem Relation Age of Onset  . Lung cancer Father   . Multiple sclerosis Mother   . Heart attack      paternal aunts and uncles    History   Social History  . Marital Status: Widowed    Spouse Name: N/A    Number of Children: 3  . Years of Education: N/A   Occupational History  . Heavy equipment mechanic--retired    Social History Main Topics  . Smoking status: Former Smoker    Types: Cigars    Quit date: 01/07/2001  . Smokeless tobacco: Never Used  . Alcohol Use: Yes     Comment: rare  . Drug Use: No  . Sexual Activity: Yes     Partners: Female   Other Topics Concern  . Not on file   Social History Narrative   Regular exercise: seldom   Caffeine use: coffee daily   Review of Systems No fever No night sweats or chills Appetite is fine    Objective:   Physical Exam  Constitutional: He appears well-developed and well-nourished. No distress.  Musculoskeletal: He exhibits edema.  Both calves are tight 2+ non pitting edema in left ankle and foot Some venous stasis changes bilaterally  No tenderness at amputation site Wound is fully healed up          Assessment & Plan:

## 2013-03-15 NOTE — Assessment & Plan Note (Signed)
Discussed using compression hose Will give furosemide for prn use No signs of infection fortunately

## 2013-03-31 ENCOUNTER — Inpatient Hospital Stay (HOSPITAL_COMMUNITY): Payer: No Typology Code available for payment source

## 2013-03-31 ENCOUNTER — Ambulatory Visit (INDEPENDENT_AMBULATORY_CARE_PROVIDER_SITE_OTHER): Payer: No Typology Code available for payment source | Admitting: Internal Medicine

## 2013-03-31 ENCOUNTER — Ambulatory Visit: Payer: No Typology Code available for payment source | Admitting: Internal Medicine

## 2013-03-31 ENCOUNTER — Encounter (HOSPITAL_COMMUNITY): Payer: Self-pay | Admitting: *Deleted

## 2013-03-31 ENCOUNTER — Encounter: Payer: Self-pay | Admitting: Internal Medicine

## 2013-03-31 ENCOUNTER — Inpatient Hospital Stay (HOSPITAL_COMMUNITY)
Admission: AD | Admit: 2013-03-31 | Discharge: 2013-04-02 | DRG: 603 | Disposition: A | Discharge status: Home / Self Care | Payer: No Typology Code available for payment source | Source: Ambulatory Visit | Attending: Internal Medicine | Admitting: Internal Medicine

## 2013-03-31 VITALS — BP 130/80 | HR 83 | Temp 98.6°F | Wt 292.0 lb

## 2013-03-31 DIAGNOSIS — L03116 Cellulitis of left lower limb: Secondary | ICD-10-CM | POA: Diagnosis present

## 2013-03-31 DIAGNOSIS — I872 Venous insufficiency (chronic) (peripheral): Secondary | ICD-10-CM

## 2013-03-31 DIAGNOSIS — G589 Mononeuropathy, unspecified: Secondary | ICD-10-CM

## 2013-03-31 DIAGNOSIS — Z0000 Encounter for general adult medical examination without abnormal findings: Secondary | ICD-10-CM

## 2013-03-31 DIAGNOSIS — Z87442 Personal history of urinary calculi: Secondary | ICD-10-CM

## 2013-03-31 DIAGNOSIS — M159 Polyosteoarthritis, unspecified: Secondary | ICD-10-CM

## 2013-03-31 DIAGNOSIS — I10 Essential (primary) hypertension: Secondary | ICD-10-CM | POA: Diagnosis present

## 2013-03-31 DIAGNOSIS — S98119A Complete traumatic amputation of unspecified great toe, initial encounter: Secondary | ICD-10-CM

## 2013-03-31 DIAGNOSIS — Z951 Presence of aortocoronary bypass graft: Secondary | ICD-10-CM

## 2013-03-31 DIAGNOSIS — L02619 Cutaneous abscess of unspecified foot: Principal | ICD-10-CM | POA: Diagnosis present

## 2013-03-31 DIAGNOSIS — Z6830 Body mass index (BMI) 30.0-30.9, adult: Secondary | ICD-10-CM

## 2013-03-31 DIAGNOSIS — G4733 Obstructive sleep apnea (adult) (pediatric): Secondary | ICD-10-CM | POA: Diagnosis present

## 2013-03-31 DIAGNOSIS — M9080 Osteopathy in diseases classified elsewhere, unspecified site: Secondary | ICD-10-CM | POA: Diagnosis present

## 2013-03-31 DIAGNOSIS — D649 Anemia, unspecified: Secondary | ICD-10-CM

## 2013-03-31 DIAGNOSIS — D72829 Elevated white blood cell count, unspecified: Secondary | ICD-10-CM

## 2013-03-31 DIAGNOSIS — Z89422 Acquired absence of other left toe(s): Secondary | ICD-10-CM

## 2013-03-31 DIAGNOSIS — Z801 Family history of malignant neoplasm of trachea, bronchus and lung: Secondary | ICD-10-CM

## 2013-03-31 DIAGNOSIS — Z8546 Personal history of malignant neoplasm of prostate: Secondary | ICD-10-CM

## 2013-03-31 DIAGNOSIS — L03119 Cellulitis of unspecified part of limb: Secondary | ICD-10-CM | POA: Diagnosis present

## 2013-03-31 DIAGNOSIS — M25579 Pain in unspecified ankle and joints of unspecified foot: Secondary | ICD-10-CM | POA: Diagnosis present

## 2013-03-31 DIAGNOSIS — Z86010 Personal history of colonic polyps: Secondary | ICD-10-CM

## 2013-03-31 DIAGNOSIS — M8448XA Pathological fracture, other site, initial encounter for fracture: Secondary | ICD-10-CM | POA: Diagnosis present

## 2013-03-31 DIAGNOSIS — I2510 Atherosclerotic heart disease of native coronary artery without angina pectoris: Secondary | ICD-10-CM | POA: Diagnosis present

## 2013-03-31 DIAGNOSIS — K648 Other hemorrhoids: Secondary | ICD-10-CM

## 2013-03-31 DIAGNOSIS — E11610 Type 2 diabetes mellitus with diabetic neuropathic arthropathy: Secondary | ICD-10-CM

## 2013-03-31 DIAGNOSIS — Z87891 Personal history of nicotine dependence: Secondary | ICD-10-CM

## 2013-03-31 DIAGNOSIS — E785 Hyperlipidemia, unspecified: Secondary | ICD-10-CM

## 2013-03-31 DIAGNOSIS — N529 Male erectile dysfunction, unspecified: Secondary | ICD-10-CM

## 2013-03-31 DIAGNOSIS — C61 Malignant neoplasm of prostate: Secondary | ICD-10-CM

## 2013-03-31 DIAGNOSIS — E876 Hypokalemia: Secondary | ICD-10-CM | POA: Diagnosis present

## 2013-03-31 DIAGNOSIS — E669 Obesity, unspecified: Secondary | ICD-10-CM | POA: Diagnosis present

## 2013-03-31 DIAGNOSIS — H409 Unspecified glaucoma: Secondary | ICD-10-CM | POA: Diagnosis present

## 2013-03-31 DIAGNOSIS — E1149 Type 2 diabetes mellitus with other diabetic neurological complication: Secondary | ICD-10-CM | POA: Diagnosis present

## 2013-03-31 DIAGNOSIS — G4730 Sleep apnea, unspecified: Secondary | ICD-10-CM

## 2013-03-31 DIAGNOSIS — M7989 Other specified soft tissue disorders: Secondary | ICD-10-CM | POA: Diagnosis present

## 2013-03-31 LAB — BASIC METABOLIC PANEL
BUN: 24 mg/dL — ABNORMAL HIGH (ref 6–23)
CALCIUM: 10.5 mg/dL (ref 8.4–10.5)
CO2: 26 mEq/L (ref 19–32)
CREATININE: 1.05 mg/dL (ref 0.50–1.35)
Chloride: 100 mEq/L (ref 96–112)
GFR, EST AFRICAN AMERICAN: 85 mL/min — AB (ref >90)
GFR, EST NON AFRICAN AMERICAN: 73 mL/min — AB (ref >90)
Glucose, Bld: 186 mg/dL — ABNORMAL HIGH (ref 70–99)
Potassium: 4.1 mEq/L (ref 3.7–5.3)
SODIUM: 141 meq/L (ref 137–147)

## 2013-03-31 LAB — CBC WITH DIFFERENTIAL/PLATELET
BASOS ABS: 0.1 10*3/uL (ref 0.0–0.1)
Basophils Relative: 1 % (ref 0–1)
EOS ABS: 0.5 10*3/uL (ref 0.0–0.7)
Eosinophils Relative: 3 % (ref 0–5)
HCT: 40.6 % (ref 39.0–52.0)
Hemoglobin: 13.6 g/dL (ref 13.0–17.0)
LYMPHS ABS: 1.6 10*3/uL (ref 0.7–4.0)
Lymphocytes Relative: 12 % (ref 12–46)
MCH: 28.2 pg (ref 26.0–34.0)
MCHC: 33.5 g/dL (ref 30.0–36.0)
MCV: 84.2 fL (ref 78.0–100.0)
Monocytes Absolute: 0.7 10*3/uL (ref 0.1–1.0)
Monocytes Relative: 6 % (ref 3–12)
NEUTROS PCT: 79 % — AB (ref 43–77)
Neutro Abs: 10.5 10*3/uL — ABNORMAL HIGH (ref 1.7–7.7)
PLATELETS: 249 10*3/uL (ref 150–400)
RBC: 4.82 MIL/uL (ref 4.22–5.81)
RDW: 16.3 % — AB (ref 11.5–15.5)
WBC: 13.2 10*3/uL — AB (ref 4.0–10.5)

## 2013-03-31 LAB — GLUCOSE, CAPILLARY
GLUCOSE-CAPILLARY: 135 mg/dL — AB (ref 70–99)
Glucose-Capillary: 175 mg/dL — ABNORMAL HIGH (ref 70–99)

## 2013-03-31 LAB — HEMOGLOBIN A1C
Hgb A1c MFr Bld: 7.2 % — ABNORMAL HIGH (ref <5.7)
Mean Plasma Glucose: 160 mg/dL — ABNORMAL HIGH (ref <117)

## 2013-03-31 LAB — PROTIME-INR
INR: 1.26 (ref 0.00–1.49)
Prothrombin Time: 15.5 seconds — ABNORMAL HIGH (ref 11.6–15.2)

## 2013-03-31 LAB — C-REACTIVE PROTEIN: CRP: 5.3 mg/dL — AB (ref <0.60)

## 2013-03-31 LAB — SEDIMENTATION RATE: Sed Rate: 35 mm/hr — ABNORMAL HIGH (ref 0–16)

## 2013-03-31 MED ORDER — VANCOMYCIN HCL 10 G IV SOLR
1250.0000 mg | Freq: Two times a day (BID) | INTRAVENOUS | Status: DC
  Administered 2013-04-01 – 2013-04-02 (×3): 1250 mg via INTRAVENOUS
  Filled 2013-03-31 (×4): qty 1250

## 2013-03-31 MED ORDER — POLYETHYLENE GLYCOL 3350 17 G PO PACK
17.0000 g | PACK | Freq: Every day | ORAL | Status: DC | PRN
  Filled 2013-03-31: qty 1

## 2013-03-31 MED ORDER — INSULIN ASPART 100 UNIT/ML ~~LOC~~ SOLN
0.0000 [IU] | Freq: Every day | SUBCUTANEOUS | Status: DC
Start: 2013-03-31 — End: 2013-04-02

## 2013-03-31 MED ORDER — INSULIN DETEMIR 100 UNIT/ML ~~LOC~~ SOLN
75.0000 [IU] | Freq: Every day | SUBCUTANEOUS | Status: DC
  Administered 2013-03-31 – 2013-04-01 (×2): 75 [IU] via SUBCUTANEOUS
  Filled 2013-03-31 (×4): qty 0.75

## 2013-03-31 MED ORDER — PIPERACILLIN-TAZOBACTAM 3.375 G IVPB
3.3750 g | Freq: Three times a day (TID) | INTRAVENOUS | Status: DC
Start: 2013-04-01 — End: 2013-04-02
  Administered 2013-04-01 – 2013-04-02 (×5): 3.375 g via INTRAVENOUS
  Filled 2013-03-31 (×7): qty 50

## 2013-03-31 MED ORDER — CARVEDILOL 25 MG PO TABS
25.0000 mg | ORAL_TABLET | Freq: Two times a day (BID) | ORAL | Status: DC
  Administered 2013-03-31 – 2013-04-02 (×5): 25 mg via ORAL
  Filled 2013-03-31 (×7): qty 1

## 2013-03-31 MED ORDER — BISACODYL 10 MG RE SUPP: 10.0000 mg | Freq: Every day | RECTAL | Status: DC | PRN

## 2013-03-31 MED ORDER — LOSARTAN POTASSIUM-HCTZ 50-12.5 MG PO TABS: 1.0000 | ORAL_TABLET | Freq: Every day | ORAL | Status: DC

## 2013-03-31 MED ORDER — PIPERACILLIN-TAZOBACTAM 3.375 G IVPB
3.3750 g | INTRAVENOUS | Status: AC
  Administered 2013-03-31: 3.375 g via INTRAVENOUS
  Filled 2013-03-31: qty 50

## 2013-03-31 MED ORDER — ACETAMINOPHEN 650 MG RE SUPP: 650.0000 mg | Freq: Four times a day (QID) | RECTAL | Status: DC | PRN

## 2013-03-31 MED ORDER — OXYCODONE HCL 5 MG PO TABS: 5.0000 mg | ORAL_TABLET | ORAL | Status: DC | PRN

## 2013-03-31 MED ORDER — LOSARTAN POTASSIUM 50 MG PO TABS
50.0000 mg | ORAL_TABLET | Freq: Every day | ORAL | Status: DC
  Administered 2013-04-01 – 2013-04-02 (×2): 50 mg via ORAL
  Filled 2013-03-31 (×2): qty 1

## 2013-03-31 MED ORDER — ENOXAPARIN SODIUM 40 MG/0.4ML ~~LOC~~ SOLN
40.0000 mg | SUBCUTANEOUS | Status: DC
  Administered 2013-03-31: 40 mg via SUBCUTANEOUS
  Filled 2013-03-31 (×2): qty 0.4

## 2013-03-31 MED ORDER — INSULIN ASPART 100 UNIT/ML ~~LOC~~ SOLN
15.0000 [IU] | Freq: Three times a day (TID) | SUBCUTANEOUS | Status: DC
  Administered 2013-03-31 – 2013-04-01 (×4): 15 [IU] via SUBCUTANEOUS

## 2013-03-31 MED ORDER — HYDROCHLOROTHIAZIDE 12.5 MG PO CAPS
12.5000 mg | ORAL_CAPSULE | Freq: Every day | ORAL | Status: DC
  Administered 2013-04-01 – 2013-04-02 (×2): 12.5 mg via ORAL
  Filled 2013-03-31 (×2): qty 1

## 2013-03-31 MED ORDER — INSULIN DETEMIR 100 UNIT/ML ~~LOC~~ SOLN: 60.0000 [IU] | Freq: Every day | SUBCUTANEOUS | Status: DC

## 2013-03-31 MED ORDER — VANCOMYCIN HCL 10 G IV SOLR
2500.0000 mg | INTRAVENOUS | Status: AC
  Administered 2013-03-31: 2500 mg via INTRAVENOUS
  Filled 2013-03-31: qty 2500

## 2013-03-31 MED ORDER — INSULIN ASPART 100 UNIT/ML ~~LOC~~ SOLN: 12.0000 [IU] | Freq: Three times a day (TID) | SUBCUTANEOUS | Status: DC

## 2013-03-31 MED ORDER — SODIUM CHLORIDE 0.9 % IV SOLN: INTRAVENOUS | Status: DC

## 2013-03-31 MED ORDER — GADOBENATE DIMEGLUMINE 529 MG/ML IV SOLN
20.0000 mL | Freq: Once | INTRAVENOUS | Status: AC | PRN
  Administered 2013-03-31: 20 mL via INTRAVENOUS

## 2013-03-31 MED ORDER — BRIMONIDINE TARTRATE 0.15 % OP SOLN
1.0000 [drp] | Freq: Every day | OPHTHALMIC | Status: DC
  Administered 2013-03-31 – 2013-04-02 (×3): 1 [drp] via OPHTHALMIC
  Filled 2013-03-31: qty 5

## 2013-03-31 MED ORDER — ADULT MULTIVITAMIN W/MINERALS CH
1.0000 | ORAL_TABLET | Freq: Every day | ORAL | Status: DC
  Administered 2013-04-01 – 2013-04-02 (×2): 1 via ORAL
  Filled 2013-03-31 (×3): qty 1

## 2013-03-31 MED ORDER — ACETAMINOPHEN 325 MG PO TABS: 650.0000 mg | ORAL_TABLET | Freq: Four times a day (QID) | ORAL | Status: DC | PRN

## 2013-03-31 MED ORDER — INSULIN ASPART 100 UNIT/ML ~~LOC~~ SOLN
0.0000 [IU] | Freq: Three times a day (TID) | SUBCUTANEOUS | Status: DC
  Administered 2013-03-31: 2 [IU] via SUBCUTANEOUS
  Administered 2013-04-01: 1 [IU] via SUBCUTANEOUS
  Administered 2013-04-01: 3 [IU] via SUBCUTANEOUS
  Administered 2013-04-01: 5 [IU] via SUBCUTANEOUS
  Administered 2013-04-02: 1 [IU] via SUBCUTANEOUS
  Administered 2013-04-02 (×2): 3 [IU] via SUBCUTANEOUS

## 2013-03-31 NOTE — Progress Notes (Signed)
Assessed patient and was unable to palpate left pedal pulse. Doppler used and pulse was found.

## 2013-03-31 NOTE — H&P (Addendum)
[facility] History and Physical  CHE RACHAL ZDG:[id number] DOB: [birth year] DOA: 03/31/2013  Referring physician:  Viviana Simpler PCP:  Viviana Simpler, MD   Chief Complaint:  Left foot swelling, pain, redness  HPI:  The patient is a 63 y.o. year-old male with history of CAD s/p CABG 5v, T2DM followed by Dr. [name], OSA and obesity, prostate CA in remission who presents with left foot swelling and pain.  The patient was admitted in 12/2012 with an infected diabetic ulcer with osteomyelitis of the left first toe.  He underwent amputation of the left 1st toe by Dr. Marlou Sa and wound culture grew mixed flora and Klebsiella and PSA.  He completed a long course antibiotics for mop-up therapy per infectious disease and had good clinical improvement.  He stopped his antibiotics a few months ago and felt well.  About 2 weeks ago, he developed swelling and some mild pain in his left ankle.  He was prescribed lasix and told to elevate his ankle.  He states the swelling in his ankle did not improve.  He developed increasing erythema of the medial aspect of the right ankle and forefoot.  His pain localized to medial malleolus, worse with movement of ankle and when he hangs his legs.  Two days ago, he developed duskiness of the lateral foot with increased pain with ambulation.  He was seen by his PCP who recommended admission for IV antibiotics and further testing to assess for osteomyelitis or vascular insufficiency.  Review of Systems:  General:  Denies fevers, chills, weight loss or gain HEENT:  Denies changes to hearing and vision, rhinorrhea, sinus congestion, sore throat CV:  Denies chest pain and palpitations, lower extremity edema.  PULM:  Denies SOB, wheezing, cough.   GI:  Denies nausea, vomiting, constipation, diarrhea.   GU:  Denies dysuria, frequency, urgency ENDO:  Denies polyuria, polydipsia.   HEME:  Denies hematemesis, blood in stools, melena, abnormal bruising or bleeding.  LYMPH:   Denies lymphadenopathy.   MSK:  Per HPI DERM:  Per HPI NEURO:  Chronic numbness of hands and feet.  denies weakness, slurred speech, confusion, facial droop.  PSYCH:  Denies anxiety and depression.    Past Medical History  Diagnosis Date  . Malignant neoplasm of prostate   . Coronary atherosclerosis of unspecified type of vessel, native or graft   . Type II or unspecified type diabetes mellitus with neurological manifestations, not stated as uncontrolled   . Impotence of organic origin   . Unspecified glaucoma   . Internal hemorrhoids without mention of complication   . Other and unspecified hyperlipidemia   . Mononeuritis of unspecified site   . Osteoarthrosis, unspecified whether generalized or localized, unspecified site   . Personal history of colonic polyps   . Routine general medical examination at a health care facility   . Hemorrhage of rectum and anus   . Unspecified sleep apnea   . Unspecified venous (peripheral) insufficiency   . Personal history of gallstones   . Personal history of diabetic foot ulcer     saw wound center, resolved 05/08/2010   Past Surgical History  Procedure Laterality Date  . Cardiac catheterization  1998    Negative  . Thrombosed vein  1993    Right leg  . Kidney stone surgery  04/1993  . Retinal detachment surgery  2002-2003  . Rca stents  04/2003    EF 55%  . Coronary artery bypass graft  09/2005    Post op AFIB  .  Insertion prostate radiation seed  2009    RT and seeds for prostate cancer  . Amputation Left 12/30/2012    Procedure: AMPUTATION RAY ;  Surgeon: Meredith Pel, MD;  Location: [facility];  Service: Orthopedics;  Laterality: Left;  LEFT GREAT TOE RAY AMPUTATION   Social History:  reports that he quit smoking about 12 years ago. His smoking use included Cigars and Cigarettes. He has a 70 pack-year smoking history. He has never used smokeless tobacco. He reports that he drinks alcohol. He reports that he does not use illicit  drugs.   Allergies  Allergen Reactions  . Atorvastatin     REACTION: myalgias  . Ezetimibe Other (See Comments)    Body ache  . Simvastatin Other (See Comments)    Body ache    Family History  Problem Relation Age of Onset  . Lung cancer Father   . Multiple sclerosis Mother   . Heart attack      paternal aunts and uncles  . Peripheral vascular disease Maternal Grandfather     several amputations     Prior to Admission medications   Medication Sig Start Date End Date Taking? Authorizing Provider  aspirin 325 MG tablet Take 325 mg by mouth daily.      Historical Provider, MD  B-D UF III MINI PEN NEEDLES 31G X 5 MM MISC  02/01/13   Historical Provider, MD  brimonidine (ALPHAGAN) 0.15 % ophthalmic solution Place 1 drop into both eyes daily.      Historical Provider, MD  carvedilol (COREG) 25 MG tablet TAKE 1 TABLET TWICE A DAY WITH A MEAL    [name], MD  FREESTYLE INSULINX TEST test strip  02/15/13   Historical Provider, MD  furosemide (LASIX) 40 MG tablet Take 1 tablet (40 mg total) by mouth daily as needed. 03/15/13   [name], MD  insulin detemir (LEVEMIR) 100 UNIT/ML injection Inject 0.65 mLs (65 Units total) into the skin at bedtime. 11/02/12   Philemon Kingdom, MD  insulin lispro (HUMALOG PEN) 100 UNIT/ML SOPN Inject up to 25 units into the skin before each of the 3 meals. Dx code: 250.60. 11/26/12   [name], MD  losartan-hydrochlorothiazide [facility]) 50-12.5 MG per tablet TAKE 1 TABLET DAILY    [name], MD  metFORMIN (GLUCOPHAGE) 1000 MG tablet TAKE 1 TABLET TWICE A DAY    [name], MD  Multiple Vitamin (MULTIVITAMIN) tablet Take 1 tablet by mouth daily.      Historical Provider, MD  oxyCODONE (OXY IR/ROXICODONE) 5 MG immediate release tablet Take 1 tablet (5 mg total) by mouth every 4 (four) hours as needed for moderate pain. 01/03/13   Theodis Blaze, MD   Physical Exam: Filed Vitals:   03/31/13 1545  BP: 130/69  Pulse: 93  Temp:  98.1 F (36.7 C)  TempSrc: Oral  Resp: 18  Height: $Remove'6\' 2"'gfhMWCd$  (1.88 m)  Weight: 128.323 kg (282 lb 14.4 oz)  SpO2: 99%     General:  Obese CM, NAD  Eyes:  PERRL, anicteric, non-injected.  ENT:  Nares clear.  OP clear, non-erythematous without plaques or exudates.  MMM.  Neck:  Supple without TM or JVD.    Lymph:  No cervical, supraclavicular, or submandibular LAD.  Cardiovascular:  RRR, normal S1, S2, 1/6 systolic murmur heard best at apex, no rubs or gallops.  2+ pulses, warm extremities  Respiratory:  CTA bilaterally without increased WOB.  Abdomen:  NABS.  Soft,  ND/NT.    Skin:  Large rare papules left shin and calf, pink erythema extending from incision along medial foot where previous toe amputation was around anterior foot with palm-sized area of duskiness/purpura along lateral left foot.  TTP medial foot with no obvious ulceration.    Musculoskeletal:  Normal bulk and tone.  Trace RLE edema and 2+ LLE edema, worse around ankle.    Psychiatric:  A & O x 4.  Appropriate affect.  Neurologic:  CN 3-12 intact.  5/5 strength.  Sensation intact.  Labs on Admission:  Basic Metabolic Panel: No results found for this basename: NA, K, CL, CO2, GLUCOSE, BUN, CREATININE, CALCIUM, MG, PHOS,  in the last 168 hours Liver Function Tests: No results found for this basename: AST, ALT, ALKPHOS, BILITOT, PROT, ALBUMIN,  in the last 168 hours No results found for this basename: LIPASE, AMYLASE,  in the last 168 hours No results found for this basename: AMMONIA,  in the last 168 hours CBC: No results found for this basename: WBC, NEUTROABS, HGB, HCT, MCV, PLT,  in the last 168 hours Cardiac Enzymes: No results found for this basename: CKTOTAL, CKMB, CKMBINDEX, TROPONINI,  in the last 168 hours  BNP (last 3 results) No results found for this basename: PROBNP,  in the last 8760 hours CBG: No results found for this basename: GLUCAP,  in the last 168 hours  Radiological Exams on  Admission: No results found.  EKG: pending  Assessment/Plan Principal Problem:   Cellulitis of foot, left Active Problems:   CORONARY ARTERY DISEASE   SLEEP APNEA   Type II or unspecified type diabetes mellitus with neurological manifestations, not stated as uncontrolled   Obesity (BMI 30-39.9)  ---  Cellulitis of the left foot, recent history of osteomyelitis and purpura suggestive of vascular insufficiency -  X-ray of left foot and ankle -  ESR and CRP -  Repeat ABI and duplex bilateral lower extremity arteries -  Start vancomycin and Zosyn in this diabetic patient -  Discussed case with Dr. Marlou Sa who reviewed XRs.  DDx includes recurrent infection, charcot foot -  Non-weight bearing left foot -  Elevated left lower extremity -  If creatinine is okay, MRI with contrast left foot  Type 2 diabetes mellitus, A1c 7.6 12/2012 -  Repeat A1c -  Continue levemir 75 units -  Decrease meal insulin to 15 units with low dose SSI -  Start HS insulin  CAD, stable -  Hold asa tonight in case evidence of osteomyelitis/vascular insufficiency requires surgery -  Continue BB, ARB, and cholesterol medications  HTN, blood pressure stable. Cont home meds  OSA, stable, continue CPAP  Diet:  diabetic Access:  PIV IVF:  off Proph:  lovenox  Code Status: partial code Family Communication: patient, GF, and children Disposition Plan: Admit to med-surg  Time spent: 60 min Yiannis Tulloch, [facility] Pager [phone]  If 7PM-7AM, please contact night-coverage [email or web address] Password [facility] 03/31/2013, 5:05 PM

## 2013-03-31 NOTE — Progress Notes (Signed)
ANTIBIOTIC CONSULT NOTE - INITIAL  Pharmacy Consult for Vancomycin, Zosyn Indication: probable osteomyelitis with cellulitis  Allergies  Allergen Reactions  . Atorvastatin     REACTION: myalgias  . Ezetimibe Other (See Comments)    Body ache  . Simvastatin Other (See Comments)    Body ache    Patient Measurements: Height: 6\' 2"  (188 cm) Weight: 282 lb 14.4 oz (128.323 kg) IBW/kg (Calculated) : 82.2  Vital Signs: Temp: 98.1 F (36.7 C) (03/25 1545) Temp src: Oral (03/25 1545) BP: 130/69 mmHg (03/25 1545) Pulse Rate: 93 (03/25 1545)  Labs:  Recent Labs  03/31/13 1715  WBC 13.2*  HGB 13.6  PLT 249  CREATININE 1.05   Estimated Creatinine Clearance: 101.1 ml/min (by C-G formula based on Cr of 1.05).  Microbiology: No results found for this or any previous visit (from the past 720 hour(s)).  Medical History: Past Medical History  Diagnosis Date  . Malignant neoplasm of prostate   . Coronary atherosclerosis of unspecified type of vessel, native or graft   . Type II or unspecified type diabetes mellitus with neurological manifestations, not stated as uncontrolled   . Impotence of organic origin   . Unspecified glaucoma   . Internal hemorrhoids without mention of complication   . Other and unspecified hyperlipidemia   . Mononeuritis of unspecified site   . Osteoarthrosis, unspecified whether generalized or localized, unspecified site   . Personal history of colonic polyps   . Routine general medical examination at a health care facility   . Hemorrhage of rectum and anus   . Unspecified sleep apnea   . Unspecified venous (peripheral) insufficiency   . Personal history of gallstones   . Personal history of diabetic foot ulcer     saw wound center, resolved 05/08/2010    Medications:  Anti-infectives   Start     Dose/Rate Route Frequency Ordered Stop   03/31/13 1700  vancomycin (VANCOCIN) 2,500 mg in sodium chloride 0.9 % 500 mL IVPB     2,500 mg 250 mL/hr over  120 Minutes Intravenous NOW 03/31/13 1647 04/01/13 1700   03/31/13 1700  piperacillin-tazobactam (ZOSYN) IVPB 3.375 g     3.375 g 12.5 mL/hr over 240 Minutes Intravenous NOW 03/31/13 1647 04/01/13 1700     Assessment: 36 yoM admitted 3/25 with swelling and redness along incision by prior amputation site with ecchymoses and redness also on plantar surface.  PMH significant for Ray Amputation of left great toe by Dr. Marlou Sa on 12/30/12.  Pharmacy is consulted to dose vancomycin and zosyn for suspected osteomyelitis and cellulitis.  Tmax: 98.6  WBCs: 13.2  Renal: SCr 1.05, CrCl > 100 CG, ~ 72 N   Goal of Therapy:  Vancomycin trough level 15-20 mcg/ml Appropriate abx dosing, eradication of infection.  Plan:   Zosyn 3.375g IV Q8H infused over 4hrs.  Vancomycin 2500mg  IV now, then 1250mg  IV q12h.  Measure Vanc trough at steady state.  Follow up renal fxn and culture results.   Gretta Arab PharmD, BCPS Pager [phone] 03/31/2013 6:45 PM

## 2013-03-31 NOTE — Assessment & Plan Note (Signed)
The distribution is concerning for residual osteomyelitis Will arrange direct admission again Discussed with hospitalist Dr Byrd Hesselbach

## 2013-03-31 NOTE — Progress Notes (Signed)
Pre visit review using our clinic review tool, if applicable. No additional management support is needed unless otherwise documented below in the visit note. 

## 2013-03-31 NOTE — Progress Notes (Signed)
Subjective:    Patient ID: [name], male    DOB: 1950-07-13, 63 y.o.   MRN: [id number]  HPI Here with partner  Thinks the ankle pain goes back to last visit but has been worsening Redness on plantar surface for a couple of days Sudden redness on top of foot today Swelling really progressed  Can't walk without sig pain Compression socks cause a lot of pain  Current Outpatient Prescriptions on File Prior to Visit  Medication Sig Dispense Refill  . aspirin 325 MG tablet Take 325 mg by mouth daily.        . B-D UF III MINI PEN NEEDLES 31G X 5 MM MISC       . brimonidine (ALPHAGAN) 0.15 % ophthalmic solution Place 1 drop into both eyes daily.        . carvedilol (COREG) 25 MG tablet TAKE 1 TABLET TWICE A DAY WITH A MEAL  180 tablet  2  . FREESTYLE INSULINX TEST test strip       . furosemide (LASIX) 40 MG tablet Take 1 tablet (40 mg total) by mouth daily as needed.  30 tablet  3  . insulin detemir (LEVEMIR) 100 UNIT/ML injection Inject 0.65 mLs (65 Units total) into the skin at bedtime.  20 mL  11  . insulin lispro (HUMALOG PEN) 100 UNIT/ML SOPN Inject up to 25 units into the skin before each of the 3 meals. Dx code: 250.60.  25 pen  3  . losartan-hydrochlorothiazide (HYZAAR) 50-12.5 MG per tablet TAKE 1 TABLET DAILY  90 tablet  2  . metFORMIN (GLUCOPHAGE) 1000 MG tablet TAKE 1 TABLET TWICE A DAY  180 tablet  2  . Multiple Vitamin (MULTIVITAMIN) tablet Take 1 tablet by mouth daily.        Marland Kitchen oxyCODONE (OXY IR/ROXICODONE) 5 MG immediate release tablet Take 1 tablet (5 mg total) by mouth every 4 (four) hours as needed for moderate pain.  65 tablet  0   No current facility-administered medications on file prior to visit.    Allergies  Allergen Reactions  . Atorvastatin     REACTION: myalgias  . Ezetimibe Other (See Comments)    Body ache  . Simvastatin Other (See Comments)    Body ache    Past Medical History  Diagnosis Date  . Malignant neoplasm of prostate   . Coronary  atherosclerosis of unspecified type of vessel, native or graft   . Type II or unspecified type diabetes mellitus with neurological manifestations, not stated as uncontrolled   . Impotence of organic origin   . Unspecified glaucoma   . Internal hemorrhoids without mention of complication   . Other and unspecified hyperlipidemia   . Mononeuritis of unspecified site   . Osteoarthrosis, unspecified whether generalized or localized, unspecified site   . Personal history of colonic polyps   . Routine general medical examination at a health care facility   . Hemorrhage of rectum and anus   . Unspecified sleep apnea   . Unspecified venous (peripheral) insufficiency   . Personal history of gallstones   . Personal history of diabetic foot ulcer     saw wound center, resolved 05/08/2010    Past Surgical History  Procedure Laterality Date  . Cardiac catheterization  1998    Negative  . Thrombosed vein  1993    Right leg  . Kidney stone surgery  04/1993  . Retinal detachment surgery  2002-2003  . Rca stents  04/2003  EF 55%  . Coronary artery bypass graft  09/2005    Post op AFIB  . Insertion prostate radiation seed  2009    RT and seeds for prostate cancer  . Amputation Left 12/30/2012    Procedure: AMPUTATION RAY ;  Surgeon: Meredith Pel, MD;  Location: [facility];  Service: Orthopedics;  Laterality: Left;  LEFT GREAT TOE RAY AMPUTATION    Family History  Problem Relation Age of Onset  . Lung cancer Father   . Multiple sclerosis Mother   . Heart attack      paternal aunts and uncles    History   Social History  . Marital Status: Widowed    Spouse Name: N/A    Number of Children: 3  . Years of Education: N/A   Occupational History  . Heavy equipment mechanic--retired    Social History Main Topics  . Smoking status: Former Smoker    Types: Cigars    Quit date: 01/07/2001  . Smokeless tobacco: Never Used  . Alcohol Use: Yes     Comment: rare  . Drug Use: No  . Sexual  Activity: Yes    Partners: Female   Other Topics Concern  . Not on file   Social History Narrative   Regular exercise: seldom   Caffeine use: coffee daily   Review of Systems No fever No sweats or chills    Objective:   Physical Exam  Constitutional: He appears well-developed and well-nourished. No distress.  Musculoskeletal:  Marked swelling in left foot Redness, warmth and tenderness along incision by amputation site Ecchymoses and redness on lateral mid plantar surface also          Assessment & Plan:

## 2013-04-01 DIAGNOSIS — D72829 Elevated white blood cell count, unspecified: Secondary | ICD-10-CM

## 2013-04-01 DIAGNOSIS — D649 Anemia, unspecified: Secondary | ICD-10-CM

## 2013-04-01 DIAGNOSIS — E11610 Type 2 diabetes mellitus with diabetic neuropathic arthropathy: Secondary | ICD-10-CM

## 2013-04-01 DIAGNOSIS — I739 Peripheral vascular disease, unspecified: Secondary | ICD-10-CM

## 2013-04-01 LAB — CBC
HEMATOCRIT: 35.5 % — AB (ref 39.0–52.0)
HEMOGLOBIN: 11.8 g/dL — AB (ref 13.0–17.0)
MCH: 28 pg (ref 26.0–34.0)
MCHC: 33.2 g/dL (ref 30.0–36.0)
MCV: 84.1 fL (ref 78.0–100.0)
Platelets: 199 10*3/uL (ref 150–400)
RBC: 4.22 MIL/uL (ref 4.22–5.81)
RDW: 16.6 % — ABNORMAL HIGH (ref 11.5–15.5)
WBC: 10.5 10*3/uL (ref 4.0–10.5)

## 2013-04-01 LAB — BASIC METABOLIC PANEL
BUN: 24 mg/dL — ABNORMAL HIGH (ref 6–23)
CHLORIDE: 100 meq/L (ref 96–112)
CO2: 27 mEq/L (ref 19–32)
Calcium: 9.5 mg/dL (ref 8.4–10.5)
Creatinine, Ser: 1.02 mg/dL (ref 0.50–1.35)
GFR calc Af Amer: 88 mL/min — ABNORMAL LOW (ref >90)
GFR calc non Af Amer: 76 mL/min — ABNORMAL LOW (ref >90)
Glucose, Bld: 164 mg/dL — ABNORMAL HIGH (ref 70–99)
POTASSIUM: 3.6 meq/L — AB (ref 3.7–5.3)
Sodium: 139 mEq/L (ref 137–147)

## 2013-04-01 LAB — GLUCOSE, CAPILLARY
GLUCOSE-CAPILLARY: 142 mg/dL — AB (ref 70–99)
GLUCOSE-CAPILLARY: 260 mg/dL — AB (ref 70–99)
GLUCOSE-CAPILLARY: 245 mg/dL — AB (ref 70–99)
Glucose-Capillary: 177 mg/dL — ABNORMAL HIGH (ref 70–99)

## 2013-04-01 MED ORDER — ENOXAPARIN SODIUM 60 MG/0.6ML ~~LOC~~ SOLN
60.0000 mg | SUBCUTANEOUS | Status: DC
  Administered 2013-04-01 – 2013-04-02 (×2): 60 mg via SUBCUTANEOUS
  Filled 2013-04-01 (×2): qty 0.6

## 2013-04-01 MED ORDER — INSULIN ASPART 100 UNIT/ML ~~LOC~~ SOLN
18.0000 [IU] | Freq: Three times a day (TID) | SUBCUTANEOUS | Status: DC
  Administered 2013-04-02 (×3): 18 [IU] via SUBCUTANEOUS

## 2013-04-01 NOTE — Care Management Note (Signed)
    Page 1 of 1   04/01/2013     3:01:43 PM   CARE MANAGEMENT NOTE 04/01/2013  Patient:  [name], [name]   Account Number:  [id number]  Date Initiated:  04/01/2013  Documentation initiated by:  Dessa Phi  Subjective/Objective Assessment:   63 Y/O M ADMITTED W/L FOOT CELLULITIS.CB:[id number],VP FOOT ULCER,CAD.     Action/Plan:   FROM HOME, ALONE.HAS PCP,PHARMACY.   Anticipated DC Date:  04/03/2013   Anticipated DC Plan:  [place]  CM consult      Choice offered to / List presented to:             Status of service:  In process, will continue to follow Medicare Important Message given?   (If response is "NO", the following Medicare IM given date fields will be blank) Date Medicare IM given:   Date Additional Medicare IM given:    Discharge Disposition:    Per UR Regulation:  Reviewed for med. necessity/level of care/duration of stay  If discussed at [place] of Stay Meetings, dates discussed:    Comments:  04/01/13 Adisynn Suleiman RN,BSN NCM [place].IV ABX.NO ANTICIPATED D/C NEEDS.

## 2013-04-01 NOTE — Progress Notes (Addendum)
[facility] PROGRESS NOTE  [name] IEP:[id number] DOB: [birth year] DOA: 03/31/2013 PCP: Viviana Simpler, MD  Assessment/Plan  Charcot deformity + possible osteomyelitis/septic arthritis of the left foot. - X-ray of left foot and ankle suggest Charcot foot and possible osteo - MRI demonstrates multiple soft tissue abscesses vs. Fluid collections of the forefoot + Charcot foot  - Continue vancomycin and zosyn - ESR 35 and CRP 5.3 - ABI:  Right 0.95, left 0.91 - Orthopedics recommends boot and weight bearing as tolerated  Type 2 diabetes mellitus, A1c 7.6 12/2012  - Repeat A1c 7.2 - Continue levemir 75 units  - Increase meal insulin to 18 units with low dose SSI  - continue HS insulin   CAD, stable  - Hold asa tonight in case evidence of osteomyelitis/vascular insufficiency requires surgery  - Continue BB, ARB, and cholesterol medications   HTN, blood pressure stable. Cont home meds  OSA, stable, continue CPAP  Hypokalemia due to decreased PO intake  -  Oral potassium   Leukocytosis, resolved with IV antibiotics  Normocytic anemia, likely secondary to acute infection and antibiotics -  No obvious bleeding -  Repeat in AM  Diet: diabetic  Access: PIV  IVF: off  Proph: lovenox   Code Status: partial code  Family Communication: patient, GF, and children  Disposition Plan:  Continue inpatient   Consultants:  Dr. Marlou Sa, orthopedics  Procedures:  XR foot  MRI foot  Antibiotics:  Vancomycin 3/25 >>  Zosyn 3/25 >>   HPI/Subjective:  States he feels well.    Objective: Filed Vitals:   03/31/13 2131 03/31/13 2319 04/01/13 0500 04/01/13 1410  BP: 137/71  129/75 91/45  Pulse: 90 91 89 78  Temp: 98 F (36.7 C)  98.1 F (36.7 C) 98 F (36.7 C)  TempSrc: Oral  Oral Oral  Resp: $Remo'18 18 18 20  'qUzfN$ Height:      Weight:      SpO2: 96%  98% 98%    Intake/Output Summary (Last 24 hours) at 04/01/13 1929 Last data filed at 04/01/13 1850  Gross per 24 hour   Intake   1430 ml  Output      4 ml  Net   1426 ml   Filed Weights   03/31/13 1545  Weight: 128.323 kg (282 lb 14.4 oz)    Exam:   General:  Obese CM, No acute distress  HEENT:  NCAT, MMM  Cardiovascular:  RRR, nl S1, S2 no mrg, 2+ pulses, warm extremities  Respiratory:  CTAB, no increased WOB  Abdomen:   NABS, soft, NT/ND MSK:   Normal tone and bulk, no LEE. pink erythema extending from incision along medial foot where previous toe amputation was around anterior foot with palm-sized area of duskiness/purpura along lateral left foot now with obvious lake of pus.  Still swollen and red.  TTP medial foot with no obvious ulceration.  Neuro:  Grossly intact  Data Reviewed: Basic Metabolic Panel:  Recent Labs Lab 03/31/13 1715 04/01/13 0345  NA 141 139  K 4.1 3.6*  CL 100 100  CO2 26 27  GLUCOSE 186* 164*  BUN 24* 24*  CREATININE 1.05 1.02  CALCIUM 10.5 9.5   Liver Function Tests: No results found for this basename: AST, ALT, ALKPHOS, BILITOT, PROT, ALBUMIN,  in the last 168 hours No results found for this basename: LIPASE, AMYLASE,  in the last 168 hours No results found for this basename: AMMONIA,  in the last 168 hours CBC:  Recent Labs  Lab 04/20/13 1715 04/01/13 0345  WBC 13.2* 10.5  NEUTROABS 10.5*  --   HGB 13.6 11.8*  HCT 40.6 35.5*  MCV 84.2 84.1  PLT 249 199   Cardiac Enzymes: No results found for this basename: CKTOTAL, CKMB, CKMBINDEX, TROPONINI,  in the last 168 hours BNP (last 3 results) No results found for this basename: PROBNP,  in the last 8760 hours CBG:  Recent Labs Lab 04-20-2013 1730 20-Apr-2013 2127 04/01/13 0745 04/01/13 1201 04/01/13 1702  GLUCAP 175* 135* 142* 260* 245*    No results found for this or any previous visit (from the past 240 hour(s)).   Studies: Dg Ankle Complete Left  Apr 20, 2013   CLINICAL DATA:  Left medial ankle pain  EXAM: LEFT ANKLE COMPLETE - 3+ VIEW  COMPARISON:  DG FOOT COMPLETE*L* dated 12/30/2012   FINDINGS: There is severe osteopenia. There is severe soft tissue swelling around the left ankle. There is disorganization destruction of the midfoot with subluxation consistent with a Charcot joint which has rapidly progressed compared with 12/30/2012 where there was normal alignment. . There is amputation of the half of the first metatarsal with surrounding callus formation. There is areas of lucency within the stump of the first metatarsal concerning for osteomyelitis.  IMPRESSION: Interval development of Charcot foot with severe soft tissue swelling around the left foot and ankle. There is amputation of the distal half of the first metatarsal with areas of lucency within the stump concerning for osteomyelitis of the first metatarsal. There is no subcutaneous emphysema.   Electronically Signed   By: Kathreen Devoid   On: 04/20/13 17:13   Mr Foot Left W Wo Contrast  04/01/2013   CLINICAL DATA:  Redness and swelling of the foot. History of osteomyelitis.  EXAM: MRI OF THE LEFT FOREFOOT WITHOUT AND WITH CONTRAST  TECHNIQUE: Multiplanar, multisequence MR imaging was performed both before and after administration of intravenous contrast.  CONTRAST:  62mL MULTIHANCE GADOBENATE DIMEGLUMINE 529 MG/ML IV SOLN  COMPARISON:  Radiograph 02/10/2023 2015  FINDINGS: The distal first metatarsal and great toe have been amputated since the prior study. There appear to be multiple gas bubbles in the soft tissues around the stump of the first metatarsal. There is no discrete osteomyelitis of the stump, however. The patient does have prominent periosteal calcification around the stump.  There is a rim enhancing fluid collection along the plantar interosseous muscle adjacent to the medial aspect of the fourth metatarsal measuring 37 x 7 x 7 mm consistent with an abscess.  There is also prominent rim enhancing fluid collection deep to the tibialis anterior tendon at the level of the navicular, best seen on image 7 of series 12,  consistent with early abscess. This is immediately adjacent to a joint effusion extending dorsally from the articulation of the medial cuneiform and navicular. This also is rim enhancing and worrisome for infection.  The patient has developed a Charcot foot at the tarsometatarsal articulations with subluxation and near dislocation of some of the joints with joint effusions. There are devitalized soft tissues along the dorsal and plantar aspects of the joints. There also appears to be devitalized subcutaneous fat along the lateral plantar aspect of the foot superficial to the fifth metatarsal.  IMPRESSION: 1.  No discrete osteomyelitis. 2. Gas in the soft tissues around the stump of the first metatarsal suggestive of gas gangrene. 3. Multiple soft tissue abscesses in the forefoot. 4. Charcot joints at the tarsometatarsal joints with adjacent devitalized soft tissues. I am concerned  that the patient may have early septic tarsometatarsal joints.   Electronically Signed   By: Rozetta Nunnery M.D.   On: 04/01/2013 08:16   Dg Foot Complete Left  03/31/2013   EXAM: LEFT FOOT - COMPLETE 3+ VIEW  COMPARISON:  None.  FINDINGS: There is severe osteopenia. There is severe soft tissue swelling around the left ankle. There is disorganization and destruction of the midfoot with subluxation consistent with a Charcot foot which has rapidly progressed compared with 12/30/2012 where there was normal alignment. There is amputation of the half of the first metatarsal with surrounding callus formation. There is areas of lucency within the stump of the first metatarsal concerning for osteomyelitis.  IMPRESSION: Interval development of Charcot foot with severe soft tissue swelling around the left foot and ankle. There is amputation of the distal half of the first metatarsal with areas of lucency within the stump concerning for osteomyelitis of the first metatarsal. There is no subcutaneous emphysema.   Electronically Signed   By: Kathreen Devoid   On: 03/31/2013 17:14    Scheduled Meds: . brimonidine  1 drop Both Eyes Daily  . carvedilol  25 mg Oral BID WC  . enoxaparin (LOVENOX) injection  60 mg Subcutaneous Q24H  . losartan  50 mg Oral Daily   And  . hydrochlorothiazide  12.5 mg Oral Daily  . insulin aspart  0-5 Units Subcutaneous QHS  . insulin aspart  0-9 Units Subcutaneous TID WC  . insulin aspart  15 Units Subcutaneous TID WC  . insulin detemir  75 Units Subcutaneous QHS  . multivitamin with minerals  1 tablet Oral Daily  . piperacillin-tazobactam (ZOSYN)  IV  3.375 g Intravenous Q8H  . vancomycin  1,250 mg Intravenous Q12H   Continuous Infusions:   Principal Problem:   Cellulitis of foot, left Active Problems:   CORONARY ARTERY DISEASE   SLEEP APNEA   Type II or unspecified type diabetes mellitus with neurological manifestations, not stated as uncontrolled   Obesity (BMI 30-39.9)   Cellulitis of foot    Time spent: 30 min    [place], [place] [facility] Pager [phone]. If 7PM-7AM, please contact night-coverage at [email or web address], password [facility] 04/01/2013, 7:29 PM  LOS: 1 day

## 2013-04-01 NOTE — Consult Note (Signed)
Reason for Consult: Left foot swelling Referring Physician: Dr. Jacelyn Grip is an 63 y.o. male.  HPI: [name] is a 63 year old patient who is now 3 months out from left foot first ray dictation it been doing well until he developed swelling in the foot denies any fever and chills or drainage. Has an ecchymosis around the lateral aspect of the foot as well.  Past Medical History  Diagnosis Date  . Malignant neoplasm of prostate   . Coronary atherosclerosis of unspecified type of vessel, native or graft   . Type II or unspecified type diabetes mellitus with neurological manifestations, not stated as uncontrolled   . Impotence of organic origin   . Unspecified glaucoma   . Internal hemorrhoids without mention of complication   . Other and unspecified hyperlipidemia   . Mononeuritis of unspecified site   . Osteoarthrosis, unspecified whether generalized or localized, unspecified site   . Personal history of colonic polyps   . Routine general medical examination at a health care facility   . Hemorrhage of rectum and anus   . Unspecified sleep apnea   . Unspecified venous (peripheral) insufficiency   . Personal history of gallstones   . Personal history of diabetic foot ulcer     saw wound center, resolved 05/08/2010    Past Surgical History  Procedure Laterality Date  . Cardiac catheterization  1998    Negative  . Thrombosed vein  1993    Right leg  . Kidney stone surgery  04/1993  . Retinal detachment surgery  2002-2003  . Rca stents  04/2003    EF 55%  . Coronary artery bypass graft  09/2005    Post op AFIB  . Insertion prostate radiation seed  2009    RT and seeds for prostate cancer  . Amputation Left 12/30/2012    Procedure: AMPUTATION RAY ;  Surgeon: Meredith Pel, MD;  Location: [facility];  Service: Orthopedics;  Laterality: Left;  LEFT GREAT TOE RAY AMPUTATION    Family History  Problem Relation Age of Onset  . Lung cancer Father   . Multiple sclerosis Mother    . Heart attack      paternal aunts and uncles  . Peripheral vascular disease Maternal Grandfather     several amputations    Social History:  reports that he quit smoking about 12 years ago. His smoking use included Cigars and Cigarettes. He has a 70 pack-year smoking history. He has never used smokeless tobacco. He reports that he drinks alcohol. He reports that he does not use illicit drugs.  Allergies:  Allergies  Allergen Reactions  . Atorvastatin     REACTION: myalgias  . Ezetimibe Other (See Comments)    Body ache  . Simvastatin Other (See Comments)    Body ache    Medications: I have reviewed the patient's current medications.  Results for orders placed during the hospital encounter of 03/31/13 (from the past 48 hour(s))  PROTIME-INR     Status: Abnormal   Collection Time    03/31/13  5:15 PM      Result Value Ref Range   Prothrombin Time 15.5 (*) 11.6 - 15.2 seconds   INR 1.26  0.00 - 1.49  HEMOGLOBIN A1C     Status: Abnormal   Collection Time    03/31/13  5:15 PM      Result Value Ref Range   Hemoglobin A1C 7.2 (*) <5.7 %   Comment: (NOTE)  According to the ADA Clinical Practice Recommendations for 2011, when     HbA1c is used as a screening test:      >=6.5%   Diagnostic of Diabetes Mellitus               (if abnormal result is confirmed)     5.7-6.4%   Increased risk of developing Diabetes Mellitus     References:Diagnosis and Classification of Diabetes Mellitus,Diabetes     STMH,9622,29(NLGXQ 1):S62-S69 and Standards of Medical Care in             Diabetes - 2011,Diabetes JJHE,1740,81 (Suppl 1):S11-S61.   Mean Plasma Glucose 160 (*) <117 mg/dL   Comment: Performed at [facility]     Status: Abnormal   Collection Time    03/31/13  5:15 PM      Result Value Ref Range   Sed Rate 35 (*) 0 - 16 mm/hr  C-REACTIVE PROTEIN     Status: Abnormal   Collection  Time    03/31/13  5:15 PM      Result Value Ref Range   CRP 5.3 (*) <0.60 mg/dL   Comment: Performed at [place]     Status: Abnormal   Collection Time    03/31/13  5:15 PM      Result Value Ref Range   Sodium 141  137 - 147 mEq/L   Potassium 4.1  3.7 - 5.3 mEq/L   Chloride 100  96 - 112 mEq/L   CO2 26  19 - 32 mEq/L   Glucose, Bld 186 (*) 70 - 99 mg/dL   BUN 24 (*) 6 - 23 mg/dL   Creatinine, Ser 1.05  0.50 - 1.35 mg/dL   Calcium 10.5  8.4 - 10.5 mg/dL   GFR calc non Af Amer 73 (*) >90 mL/min   GFR calc Af Amer 85 (*) >90 mL/min   Comment: (NOTE)     The eGFR has been calculated using the CKD EPI equation.     This calculation has not been validated in all clinical situations.     eGFR's persistently <90 mL/min signify possible Chronic Kidney     Disease.  CBC WITH DIFFERENTIAL     Status: Abnormal   Collection Time    03/31/13  5:15 PM      Result Value Ref Range   WBC 13.2 (*) 4.0 - 10.5 K/uL   RBC 4.82  4.22 - 5.81 MIL/uL   Hemoglobin 13.6  13.0 - 17.0 g/dL   HCT 40.6  39.0 - 52.0 %   MCV 84.2  78.0 - 100.0 fL   MCH 28.2  26.0 - 34.0 pg   MCHC 33.5  30.0 - 36.0 g/dL   RDW 16.3 (*) 11.5 - 15.5 %   Platelets 249  150 - 400 K/uL   Neutrophils Relative % 79 (*) 43 - 77 %   Neutro Abs 10.5 (*) 1.7 - 7.7 K/uL   Lymphocytes Relative 12  12 - 46 %   Lymphs Abs 1.6  0.7 - 4.0 K/uL   Monocytes Relative 6  3 - 12 %   Monocytes Absolute 0.7  0.1 - 1.0 K/uL   Eosinophils Relative 3  0 - 5 %   Eosinophils Absolute 0.5  0.0 - 0.7 K/uL   Basophils Relative 1  0 - 1 %   Basophils Absolute 0.1  0.0 - 0.1 K/uL  GLUCOSE, CAPILLARY     Status:  Abnormal   Collection Time    03/31/13  5:30 PM      Result Value Ref Range   Glucose-Capillary 175 (*) 70 - 99 mg/dL  GLUCOSE, CAPILLARY     Status: Abnormal   Collection Time    03/31/13  9:27 PM      Result Value Ref Range   Glucose-Capillary 135 (*) 70 - 99 mg/dL  BASIC METABOLIC PANEL     Status:  Abnormal   Collection Time    04/01/13  3:45 AM      Result Value Ref Range   Sodium 139  137 - 147 mEq/L   Potassium 3.6 (*) 3.7 - 5.3 mEq/L   Chloride 100  96 - 112 mEq/L   CO2 27  19 - 32 mEq/L   Glucose, Bld 164 (*) 70 - 99 mg/dL   BUN 24 (*) 6 - 23 mg/dL   Creatinine, Ser 1.02  0.50 - 1.35 mg/dL   Calcium 9.5  8.4 - 10.5 mg/dL   GFR calc non Af Amer 76 (*) >90 mL/min   GFR calc Af Amer 88 (*) >90 mL/min   Comment: (NOTE)     The eGFR has been calculated using the CKD EPI equation.     This calculation has not been validated in all clinical situations.     eGFR's persistently <90 mL/min signify possible Chronic Kidney     Disease.  CBC     Status: Abnormal   Collection Time    04/01/13  3:45 AM      Result Value Ref Range   WBC 10.5  4.0 - 10.5 K/uL   RBC 4.22  4.22 - 5.81 MIL/uL   Hemoglobin 11.8 (*) 13.0 - 17.0 g/dL   HCT 35.5 (*) 39.0 - 52.0 %   MCV 84.1  78.0 - 100.0 fL   MCH 28.0  26.0 - 34.0 pg   MCHC 33.2  30.0 - 36.0 g/dL   RDW 16.6 (*) 11.5 - 15.5 %   Platelets 199  150 - 400 K/uL  GLUCOSE, CAPILLARY     Status: Abnormal   Collection Time    04/01/13  7:45 AM      Result Value Ref Range   Glucose-Capillary 142 (*) 70 - 99 mg/dL   Comment 1 Notify RN     Comment 2 Documented in Chart    GLUCOSE, CAPILLARY     Status: Abnormal   Collection Time    04/01/13 12:01 PM      Result Value Ref Range   Glucose-Capillary 260 (*) 70 - 99 mg/dL   Comment 1 Notify RN     Comment 2 Documented in Chart    GLUCOSE, CAPILLARY     Status: Abnormal   Collection Time    04/01/13  5:02 PM      Result Value Ref Range   Glucose-Capillary 245 (*) 70 - 99 mg/dL   Comment 1 Notify RN     Comment 2 Documented in Chart      Dg Ankle Complete Left  03/31/2013   CLINICAL DATA:  Left medial ankle pain  EXAM: LEFT ANKLE COMPLETE - 3+ VIEW  COMPARISON:  DG FOOT COMPLETE*L* dated 12/30/2012  FINDINGS: There is severe osteopenia. There is severe soft tissue swelling around the left  ankle. There is disorganization destruction of the midfoot with subluxation consistent with a Charcot joint which has rapidly progressed compared with 12/30/2012 where there was normal alignment. . There is amputation of the half  of the first metatarsal with surrounding callus formation. There is areas of lucency within the stump of the first metatarsal concerning for osteomyelitis.  IMPRESSION: Interval development of Charcot foot with severe soft tissue swelling around the left foot and ankle. There is amputation of the distal half of the first metatarsal with areas of lucency within the stump concerning for osteomyelitis of the first metatarsal. There is no subcutaneous emphysema.   Electronically Signed   By: Kathreen Devoid   On: 03/31/2013 17:13   Mr Foot Left W Wo Contrast  04/01/2013   CLINICAL DATA:  Redness and swelling of the foot. History of osteomyelitis.  EXAM: MRI OF THE LEFT FOREFOOT WITHOUT AND WITH CONTRAST  TECHNIQUE: Multiplanar, multisequence MR imaging was performed both before and after administration of intravenous contrast.  CONTRAST:  49mL MULTIHANCE GADOBENATE DIMEGLUMINE 529 MG/ML IV SOLN  COMPARISON:  Radiograph 02/10/2023 2015  FINDINGS: The distal first metatarsal and great toe have been amputated since the prior study. There appear to be multiple gas bubbles in the soft tissues around the stump of the first metatarsal. There is no discrete osteomyelitis of the stump, however. The patient does have prominent periosteal calcification around the stump.  There is a rim enhancing fluid collection along the plantar interosseous muscle adjacent to the medial aspect of the fourth metatarsal measuring 37 x 7 x 7 mm consistent with an abscess.  There is also prominent rim enhancing fluid collection deep to the tibialis anterior tendon at the level of the navicular, best seen on image 7 of series 12, consistent with early abscess. This is immediately adjacent to a joint effusion extending  dorsally from the articulation of the medial cuneiform and navicular. This also is rim enhancing and worrisome for infection.  The patient has developed a Charcot foot at the tarsometatarsal articulations with subluxation and near dislocation of some of the joints with joint effusions. There are devitalized soft tissues along the dorsal and plantar aspects of the joints. There also appears to be devitalized subcutaneous fat along the lateral plantar aspect of the foot superficial to the fifth metatarsal.  IMPRESSION: 1.  No discrete osteomyelitis. 2. Gas in the soft tissues around the stump of the first metatarsal suggestive of gas gangrene. 3. Multiple soft tissue abscesses in the forefoot. 4. Charcot joints at the tarsometatarsal joints with adjacent devitalized soft tissues. I am concerned that the patient may have early septic tarsometatarsal joints.   Electronically Signed   By: Rozetta Nunnery M.D.   On: 04/01/2013 08:16   Dg Foot Complete Left  03/31/2013   EXAM: LEFT FOOT - COMPLETE 3+ VIEW  COMPARISON:  None.  FINDINGS: There is severe osteopenia. There is severe soft tissue swelling around the left ankle. There is disorganization and destruction of the midfoot with subluxation consistent with a Charcot foot which has rapidly progressed compared with 12/30/2012 where there was normal alignment. There is amputation of the half of the first metatarsal with surrounding callus formation. There is areas of lucency within the stump of the first metatarsal concerning for osteomyelitis.  IMPRESSION: Interval development of Charcot foot with severe soft tissue swelling around the left foot and ankle. There is amputation of the distal half of the first metatarsal with areas of lucency within the stump concerning for osteomyelitis of the first metatarsal. There is no subcutaneous emphysema.   Electronically Signed   By: Kathreen Devoid   On: 03/31/2013 17:14    Review of Systems  Constitutional: Negative.  HENT:  Negative.   Eyes: Negative.   Respiratory: Negative.   Cardiovascular: Negative.   Gastrointestinal: Negative.   Genitourinary: Negative.   Musculoskeletal: Negative.   Skin: Negative.   Neurological: Negative.   Endo/Heme/Allergies: Negative.   Psychiatric/Behavioral: Negative.    Blood pressure 91/45, pulse 78, temperature 98 F (36.7 C), temperature source Oral, resp. rate 20, height $RemoveBe'6\' 2"'[id number]$  (1.88 m), weight 128.323 kg (282 lb 14.4 oz), SpO2 98.00%. Physical Exam  Constitutional: He appears well-developed.  HENT:  Head: Normocephalic.  Eyes: Pupils are equal, round, and reactive to light.  Neck: Normal range of motion.  Cardiovascular: Normal rate.   Respiratory: Effort normal.  Neurological: He is alert.  Skin: Skin is warm.  Psychiatric: He has a normal mood and affect.   examination the left foot demonstrates swelling in the midfoot region ankle range of motion intact nontender does have some crepitus with pronation supination of the forefoot ecchymosis present laterally incision itself looks intact little erythema is present right at the incision but there is no soft tissue induration only swelling. There is some crepitus around the midfoot tarsometatarsal junctions to palpation.  Assessment/Plan: Impression is left Charcot foot. White count 10,000. Plain x-rays consistent with Charcot foot. Exam also consistent with pathologic fractures of the tarsometatarsal region. MRI scan is also reviewed and it shows potentially some fluid collections; however reviewed the case with Dr. due to who agrees that Charcot foot is the most likely diagnosis in this clinical situation recommend oral doxycycline close followup with Dr. due to in clinic on Monday weightbearing as tolerated in a fracture boot.  DEAN,GREGORY SCOTT 04/01/2013, 6:19 PM

## 2013-04-01 NOTE — Progress Notes (Signed)
Pt states he does not need any assistance with the CPAP machine or mask application. Sterile water was added for humidification. CPAP is set on auto titrate min-7 & max-20 with no oxygen bled in. Pt was made aware if he should need any assistance with the CPAP to contact RT.

## 2013-04-01 NOTE — Progress Notes (Signed)
VASCULAR LAB PRELIMINARY  ARTERIAL  ABI completed:    RIGHT    LEFT    PRESSURE WAVEFORM  PRESSURE WAVEFORM  BRACHIAL 121 Triphasic BRACHIAL 129 Triphasic  DP 102 Triphasic DP 101 Biphasic  AT   AT    PT 122 Biphasic PT 118 Monophasic  PER   PER    GREAT TOE  NA GREAT TOE  NA    RIGHT LEFT  ABI 0.95 0.91   The right ABI is within normal limits, the left ABI is on the borderline of normal limits and mild arterial insufficiency.  04/01/2013 11:49 AM Maudry Mayhew, RVT, RDCS, RDMS

## 2013-04-02 LAB — GLUCOSE, CAPILLARY
Glucose-Capillary: 130 mg/dL — ABNORMAL HIGH (ref 70–99)
Glucose-Capillary: 234 mg/dL — ABNORMAL HIGH (ref 70–99)
Glucose-Capillary: 206 mg/dL — ABNORMAL HIGH (ref 70–99)

## 2013-04-02 MED ORDER — DOXYCYCLINE HYCLATE 100 MG PO TABS: 100.0000 mg | ORAL_TABLET | Freq: Two times a day (BID) | ORAL | Status: DC

## 2013-04-02 MED ORDER — DOXYCYCLINE HYCLATE 100 MG PO TABS
100.0000 mg | ORAL_TABLET | Freq: Two times a day (BID) | ORAL | Status: DC
  Administered 2013-04-02: 100 mg via ORAL
  Filled 2013-04-02 (×2): qty 1

## 2013-04-02 MED ORDER — MUPIROCIN CALCIUM 2 % EX CREA: TOPICAL_CREAM | Freq: Every day | CUTANEOUS | Status: DC

## 2013-04-02 MED ORDER — MUPIROCIN CALCIUM 2 % EX CREA
TOPICAL_CREAM | Freq: Every day | CUTANEOUS | Status: DC
  Filled 2013-04-02: qty 15

## 2013-04-02 NOTE — Progress Notes (Addendum)
Patient appears well.  Foot less red/swollen but there is a discreet area of fluid/bulla on bottom of foot today.  Will find out if this needs to be drained or if he is okay to follow up on Monday with Dr. [name].  Dr. Marlou Sa to reevaluate.

## 2013-04-02 NOTE — Discharge Summary (Signed)
Physician Discharge [place] VVO:[id number] DOB: 18-Dec-1950 DOA: 03/31/2013  PCP: Viviana Simpler, MD  Admit date: 03/31/2013 Discharge date: 04/02/2013  Recommendations for Outpatient Follow-up:  1. Follow up with Dr. [name] on Monday 04/05/2013 at 8:30AM for examination.   2. Weight bearing as tolerated with cam-boot   3. Continue doxycycline, given 14 day course  Discharge Diagnoses:  Principal Problem:   Cellulitis of foot, left Active Problems:   CORONARY ARTERY DISEASE   SLEEP APNEA   Type II or unspecified type diabetes mellitus with neurological manifestations, not stated as uncontrolled   Obesity (BMI 30-39.9)   Cellulitis of foot   Leukocytosis, unspecified   Normocytic anemia   Diabetic Charcot foot   Discharge Condition: stable, improved  Diet recommendation: diabetic  Wt Readings from Last 3 Encounters:  03/31/13 128.323 kg (282 lb 14.4 oz)  03/31/13 132.45 kg (292 lb)  03/15/13 133.358 kg (294 lb)    History of present illness:  The patient is a 63 y.o. year-old male with history of CAD s/p CABG 5v, T2DM followed by Dr. [name], OSA and obesity, prostate CA in remission who presents with left foot swelling and pain. The patient was admitted in 12/2012 with an infected diabetic ulcer with osteomyelitis of the left first toe. He underwent amputation of the left 1st toe by Dr. Marlou Sa and wound culture grew mixed flora and Klebsiella and PSA. He completed a long course antibiotics for mop-up therapy per infectious disease and had good clinical improvement. He stopped his antibiotics a few months ago and felt well. About 2 weeks ago, he developed swelling and some mild pain in his left ankle. He was prescribed lasix and told to elevate his ankle. He states the swelling in his ankle did not improve. He developed increasing erythema of the medial aspect of the right ankle and forefoot. His pain localized to medial malleolus, worse with movement of ankle and when he  hangs his legs. Two days ago, he developed duskiness of the lateral foot with increased pain with ambulation. He was seen by his PCP who recommended admission for IV antibiotics and further testing to assess for osteomyelitis or vascular insufficiency.  Hospital Course:   Charcot deformity with fractures of the tarsometatarsal region + possible osteomyelitis/septic arthritis of the left foot.  X-ray of left foot and ankle suggest Charcot foot and possible osteomyelitis.  Follow up MRI demonstrated multiple soft tissue abscesses vs. Fluid collections of the forefoot plus Charcot foot.  He was started on vancomycin and zosyn.  Dr. Marlou Sa, Orthopedics, was consulted and reviewed case.  Because he was afebrile and his WBC, ESR, and CRP were not as elevated as one would expect with osteomyelitis/septic arthritis, it was felt that the findings on MRI were most likely secondary to Charcot foot.  The case was also reviewed with Dr. [name] who concurred that findings were likely due to Charcot foot, infection less likely.  A small fluid collection was drained by Dr. Marlou Sa from the plantar aspect of the foot on the day of discharge and the patient was requested to place bactroban to that area.  He was placed on a cam boot and allowed to bear weight as tolerated.  He already has rolling walker at home and was evaluated by PT who felt he does not need home health services. Will continue doxycycline to complete a 14 day course which can be extended if suspicion for infection increases.    BIs, right 0.95 and left 0.91, which  were normal.    Type 2 diabetes mellitus, A1c 7.2.  Continued home dose insulin with well-controlled blood sugars.    CAD, stable, continued home medications.    HTN, blood pressure stable. Cont home meds  OSA, stable, continue CPAP  Hypokalemia due to decreased PO intake and repleted with oral potassium  Leukocytosis, resolved with IV antibiotics   Normocytic anemia, likely secondary to acute  infection and antibiotics.  Repeat as outpatient in a few weeks.    Consultants:  Dr. Marlou Sa, orthopedics Procedures:  XR foot  MRI foot Antibiotics:  Vancomycin 3/25 >> 3/27  Zosyn 3/25 >> 3/27 Doxycycline 3/27 >>   Discharge Exam: Filed Vitals:   04/02/13 1332  BP: 138/66  Pulse: 78  Temp: 97.9 F (36.6 C)  Resp: 18   Filed Vitals:   04/01/13 1410 04/01/13 2030 04/02/13 0442 04/02/13 1332  BP: 91/45 128/63 113/61 138/66  Pulse: 78 69 70 78  Temp: 98 F (36.7 C) 99.6 F (37.6 C) 98.2 F (36.8 C) 97.9 F (36.6 C)  TempSrc: Oral Oral Oral Oral  Resp: $Remo'20 18 18 18  'lENVh$ Height:      Weight:      SpO2: 98% 100% 97%     General: Obese CM, No acute distress  HEENT: NCAT, MMM  Cardiovascular: RRR, nl S1, S2 no mrg, 2+ pulses, warm extremities  Respiratory: CTAB, no increased WOB  Abdomen: NABS, soft, NT/ND MSK: Normal tone and bulk, no LEE. pink erythema receding and mostly localized to incision along medial foot where previous toe amputation was.  Swelling and redness improved overall.  Purpura along lateral left foot and plantar aspect of foot becoming a bullous area with some areas that appear somewhat purulent. TTP medial foot with no obvious ulceration.  Neuro: Grossly intact   Discharge Instructions      Discharge Orders   Future Appointments Provider Department Dept Phone   04/05/2013 11:00 AM Philemon Kingdom, MD [facility] End[facility] [phone]   01/21/2014 11:30 AM [name], MD [facility]   Future Orders Complete By Expires   Call MD for:  difficulty breathing, headache or visual disturbances  As directed    Call MD for:  extreme fatigue  As directed    Call MD for:  hives  As directed    Call MD for:  persistant dizziness or light-headedness  As directed    Call MD for:  persistant nausea and vomiting  As directed    Call MD for:  redness, tenderness, or signs of infection (pain, swelling, redness,  odor or green/yellow discharge around incision site)  As directed    Call MD for:  severe uncontrolled pain  As directed    Call MD for:  temperature >100.4  As directed    Diet Carb Modified  As directed    Discharge instructions  As directed    Comments:     You were hospitalized with possible infection of your foot, so please take doxycycline until all tabs are gone.   You have charcot foot, or fractures of your foot due to diabetes.   Please wear your boot and you may bear weight as tolerated.   Please follow up with Dr. [name] on Monday at your already scheduled appointment.   Increase activity slowly  As directed    Weight bearing as tolerated  As directed    Questions:     Laterality:     Extremity:  Medication List         aspirin 325 MG tablet  Take 325 mg by mouth daily.     B-D UF III MINI PEN NEEDLES 31G X 5 MM Misc  Generic drug:  Insulin Pen Needle     brimonidine 0.15 % ophthalmic solution  Commonly known as:  ALPHAGAN  Place 1 drop into both eyes daily.     carvedilol 25 MG tablet  Commonly known as:  COREG  Take 25 mg by mouth 2 (two) times daily.     doxycycline 100 MG tablet  Commonly known as:  VIBRA-TABS  Take 1 tablet (100 mg total) by mouth every 12 (twelve) hours.     FREESTYLE INSULINX TEST test strip  Generic drug:  glucose blood     furosemide 40 MG tablet  Commonly known as:  LASIX  Take 40 mg by mouth daily as needed for fluid (for fluid retention).     insulin detemir 100 UNIT/ML injection  Commonly known as:  LEVEMIR  Inject 75 Units into the skin at bedtime.     insulin lispro 100 UNIT/ML injection  Commonly known as:  HUMALOG  Inject 18-24 Units into the skin 3 (three) times daily before meals. 18 units in the morning, 22 units at lunch, 24 units at dinner     losartan-hydrochlorothiazide 50-12.5 MG per tablet  Commonly known as:  HYZAAR  Take 1 tablet by mouth daily.     metFORMIN 1000 MG tablet  Commonly known as:   GLUCOPHAGE  Take 1,000 mg by mouth 2 (two) times daily.     multivitamin tablet  Take 1 tablet by mouth daily.     mupirocin cream 2 %  Commonly known as:  BACTROBAN  Apply topically daily.     naproxen sodium 220 MG tablet  Commonly known as:  ANAPROX  Take 440-660 mg by mouth as needed (for pain.). For pain       Follow-up Information   Follow up with [name], MD On 04/02/2013. (at 8:30 AM)    Specialty:  Orthopedic Surgery   Contact information:   [place] [address] [phone]       Follow up with Viviana Simpler, MD. (As needed)    Specialties:  Internal Medicine, Pediatrics   Contact information:   [place] [address] [phone]       The results of significant diagnostics from this hospitalization (including imaging, microbiology, ancillary and laboratory) are listed below for reference.    Significant Diagnostic Studies: Dg Ankle Complete Left  Apr 06, 2013   CLINICAL DATA:  Left medial ankle pain  EXAM: LEFT ANKLE COMPLETE - 3+ VIEW  COMPARISON:  DG FOOT COMPLETE*L* dated 12/30/2012  FINDINGS: There is severe osteopenia. There is severe soft tissue swelling around the left ankle. There is disorganization destruction of the midfoot with subluxation consistent with a Charcot joint which has rapidly progressed compared with 12/30/2012 where there was normal alignment. . There is amputation of the half of the first metatarsal with surrounding callus formation. There is areas of lucency within the stump of the first metatarsal concerning for osteomyelitis.  IMPRESSION: Interval development of Charcot foot with severe soft tissue swelling around the left foot and ankle. There is amputation of the distal half of the first metatarsal with areas of lucency within the stump concerning for osteomyelitis of the first metatarsal. There is no subcutaneous emphysema.   Electronically Signed   By: Elbert Ewings  Patel   On: 03/31/2013 17:13    Mr Foot Left W Wo Contrast  04/01/2013   CLINICAL DATA:  Redness and swelling of the foot. History of osteomyelitis.  EXAM: MRI OF THE LEFT FOREFOOT WITHOUT AND WITH CONTRAST  TECHNIQUE: Multiplanar, multisequence MR imaging was performed both before and after administration of intravenous contrast.  CONTRAST:  37mL MULTIHANCE GADOBENATE DIMEGLUMINE 529 MG/ML IV SOLN  COMPARISON:  Radiograph 02/10/2023 2015  FINDINGS: The distal first metatarsal and great toe have been amputated since the prior study. There appear to be multiple gas bubbles in the soft tissues around the stump of the first metatarsal. There is no discrete osteomyelitis of the stump, however. The patient does have prominent periosteal calcification around the stump.  There is a rim enhancing fluid collection along the plantar interosseous muscle adjacent to the medial aspect of the fourth metatarsal measuring 37 x 7 x 7 mm consistent with an abscess.  There is also prominent rim enhancing fluid collection deep to the tibialis anterior tendon at the level of the navicular, best seen on image 7 of series 12, consistent with early abscess. This is immediately adjacent to a joint effusion extending dorsally from the articulation of the medial cuneiform and navicular. This also is rim enhancing and worrisome for infection.  The patient has developed a Charcot foot at the tarsometatarsal articulations with subluxation and near dislocation of some of the joints with joint effusions. There are devitalized soft tissues along the dorsal and plantar aspects of the joints. There also appears to be devitalized subcutaneous fat along the lateral plantar aspect of the foot superficial to the fifth metatarsal.  IMPRESSION: 1.  No discrete osteomyelitis. 2. Gas in the soft tissues around the stump of the first metatarsal suggestive of gas gangrene. 3. Multiple soft tissue abscesses in the forefoot. 4. Charcot joints at the tarsometatarsal joints with adjacent  devitalized soft tissues. I am concerned that the patient may have early septic tarsometatarsal joints.   Electronically Signed   By: Geanie Cooley M.D.   On: 04/01/2013 08:16   Dg Foot Complete Left  03/31/2013   EXAM: LEFT FOOT - COMPLETE 3+ VIEW  COMPARISON:  None.  FINDINGS: There is severe osteopenia. There is severe soft tissue swelling around the left ankle. There is disorganization and destruction of the midfoot with subluxation consistent with a Charcot foot which has rapidly progressed compared with 12/30/2012 where there was normal alignment. There is amputation of the half of the first metatarsal with surrounding callus formation. There is areas of lucency within the stump of the first metatarsal concerning for osteomyelitis.  IMPRESSION: Interval development of Charcot foot with severe soft tissue swelling around the left foot and ankle. There is amputation of the distal half of the first metatarsal with areas of lucency within the stump concerning for osteomyelitis of the first metatarsal. There is no subcutaneous emphysema.   Electronically Signed   By: Elige Ko   On: 03/31/2013 17:14    Microbiology: No results found for this or any previous visit (from the past 240 hour(s)).   Labs: Basic Metabolic Panel:  Recent Labs Lab 03/31/13 1715 04/01/13 0345  NA 141 139  K 4.1 3.6*  CL 100 100  CO2 26 27  GLUCOSE 186* 164*  BUN 24* 24*  CREATININE 1.05 1.02  CALCIUM 10.5 9.5   Liver Function Tests: No results found for this basename: AST, ALT, ALKPHOS, BILITOT, PROT, ALBUMIN,  in the last 168 hours No results  found for this basename: LIPASE, AMYLASE,  in the last 168 hours No results found for this basename: AMMONIA,  in the last 168 hours CBC:  Recent Labs Lab 03/31/13 1715 04/01/13 0345  WBC 13.2* 10.5  NEUTROABS 10.5*  --   HGB 13.6 11.8*  HCT 40.6 35.5*  MCV 84.2 84.1  PLT 249 199   Cardiac Enzymes: No results found for this basename: CKTOTAL, CKMB,  CKMBINDEX, TROPONINI,  in the last 168 hours BNP: BNP (last 3 results) No results found for this basename: PROBNP,  in the last 8760 hours CBG:  Recent Labs Lab 04/01/13 1702 04/01/13 2225 04/02/13 0743 04/02/13 1142 04/02/13 1730  GLUCAP 245* 177* 130* 234* 206*    Time coordinating discharge: 45 minutes  Signed:  Remus Hagedorn  [facility] 04/02/2013, 6:11 PM

## 2013-04-02 NOTE — Progress Notes (Addendum)
[name] patient is 63 years old. He has a Charcot foot. I was asked to reevaluate him for possible fluid collection. On exam he does have what appears to be fracture blister extending from the mid lateral aspect of the metatarsal #5 down to the plantar aspect of the foot  - this is unroofed today. Is not penetrate more than below the dermis. Plan is for him to follow Dr. due to next week. Bactroban cream and dry dressing applied continue weightbearing in fracture boot.

## 2013-04-02 NOTE — Evaluation (Signed)
Physical Therapy Evaluation Patient Details Name: [name] MRN: [id number] DOB: 1950-03-09 Today's Date: 04/02/2013   History of Present Illness  63 yo male admotted with chartcot foot L, pathological fractures tarsometatarsal region  Clinical Impression  On eval, pt was Min guard assist for stair negotiation and supervision level for ambulation. Mobilizing well. Do not anticipate any further PT needs. 1x eval. Will sign off.     Follow Up Recommendations No PT follow up    Equipment Recommendations  None recommended by PT    Recommendations for Other Services       Precautions / Restrictions Precautions Precautions: Fall Required Braces or Orthoses: Other Brace/Splint Other Brace/Splint: CAM boot Restrictions Weight Bearing Restrictions: Yes LLE Weight Bearing: Weight bearing as tolerated Other Position/Activity Restrictions: with CAM boot      Mobility  Bed Mobility Overal bed mobility: Modified Independent                Transfers Overall transfer level: Needs assistance   Transfers: Sit to/from Stand Sit to Stand: Supervision         General transfer comment: VC safety  Ambulation/Gait Ambulation/Gait assistance: Supervision Ambulation Distance (Feet): 100 Feet Assistive device: Rolling walker (2 wheeled)       General Gait Details: pt denied pain.   Stairs Stairs: Yes Stairs assistance: Min guard Stair Management: Step to pattern;Forwards;One rail Right;One rail Left Number of Stairs: 3 General stair comments: VC safety, sequence. close guard for safety  Wheelchair Mobility    Modified Rankin (Stroke Patients Only)       Balance                                     Pertinent Vitals/Pain Pt denied [place] expects to be discharged to:: Private residence Living Arrangements: Spouse/significant other   Type of Home: House Home Access: Stairs to enter Entrance Stairs-Rails:  Psychiatric nurse of Steps: 3 Home Layout: One level Home Equipment: Environmental consultant - 2 wheels      Prior Function Level of Independence: Independent               Hand Dominance        Extremity/Trunk Assessment   Upper Extremity Assessment: Overall WFL for tasks assessed           Lower Extremity Assessment: Overall WFL for tasks assessed      Cervical / Trunk Assessment: Normal  Communication   Communication: No difficulties  Cognition Arousal/Alertness: Awake/alert Behavior During Therapy: WFL for tasks assessed/performed Overall Cognitive Status: Within Functional Limits for tasks assessed                      General Comments      Exercises        Assessment/Plan    PT Assessment [place] does not need any further PT services  PT Diagnosis     PT Problem List    PT Treatment Interventions     PT Goals (Current goals can be found in the Care Plan section) Acute Rehab PT Goals Patient Stated Goal: home soon PT Goal Formulation: No goals set, d/c therapy    Frequency     Barriers to discharge        End of Session   Activity Tolerance: Patient tolerated treatment well Patient left: in chair;with call bell/phone within reach;with family/visitor present  Time: 0915-0929 PT Time Calculation (min): 14 min   Charges:   PT Evaluation $Initial PT Evaluation Tier I: 1 Procedure PT Treatments $Gait Training: 8-22 mins   PT G Codes:          Weston Anna, MPT Pager: [phone]

## 2013-04-02 NOTE — Progress Notes (Signed)
Patient and family educated about how to care for foot at home. Patient and family given supplies and specific instruction. Wife at bedside verbalized understanding of how to care for foot. Will discharge patient at this time. Setzer, Marchelle Folks

## 2013-04-05 ENCOUNTER — Encounter: Payer: Self-pay | Admitting: Internal Medicine

## 2013-04-05 ENCOUNTER — Ambulatory Visit (INDEPENDENT_AMBULATORY_CARE_PROVIDER_SITE_OTHER): Payer: No Typology Code available for payment source | Admitting: Internal Medicine

## 2013-04-05 VITALS — BP 124/78 | HR 85 | Temp 97.7°F | Resp 12 | Wt 294.0 lb

## 2013-04-05 DIAGNOSIS — E1149 Type 2 diabetes mellitus with other diabetic neurological complication: Secondary | ICD-10-CM

## 2013-04-05 NOTE — Patient Instructions (Signed)
Continue the current regimen: - Metformin 1000 mg po 2x a day - Levemir 75 units at night - Humalog mealtime:  18 units with a smaller meal 22 units with a larger meal.  Add 3 units with dinner. - Humalog sliding scale: - 150-175: + 1 unit  - 176-200: + 2 units  - 201-225: + 3 units  - 226-250: + 4 units  - 251-275: + 5 units - 276-300: + 6 units  Please return in 3 months.

## 2013-04-05 NOTE — Progress Notes (Signed)
Patient ID: [name], male   DOB: 1950-07-15, 63 y.o.   MRN: [id number]  HPI: [name] is a 63 y.o.-year-old male, returning for f/u for DM2, dx 2008, insulin-dependent since dx, uncontrolled, with complications (peripheral neuropathy, CAD-status post CABG in 2008, PVD, Charcot foot, history of diabetic foot ulcer, history of retinal detachment in 2002-3). He is accompanied by his fiance, who offers parts of the history. Last visit 1.5 mo ago.  He was just discharged from the hospital for foot ulcer (bottom of L foot) >> now in a boot. MRI: no Osteomyelitis, had gas gangrene and abscesses >> now debrided and on ABx. Sees Dr [name] tomorrow.  Last hemoglobin A1c was: Lab Results  Component Value Date   HGBA1C 7.2* 03/31/2013   HGBA1C 7.6* 12/30/2012   HGBA1C 8.7* 10/05/2012   Pt is on a regimen of: - Metformin 1000 mg po bid - Levemir to 75 units - Humalog mealtime: 18 units with a smaller meal and 22 units with a larger meal. Add 3 units with dinner. - Humalog sliding scale: - 150-175: + 1 unit  - 176-200: + 2 units  - 201-225: + 3 units  - 226-250: + 4 units  - 251-275: + 5 units - 276-300: + 6 units We stopped Glipizide 5 mg.  Pt checks his sugars 3x a day and they are - per sugar log: - am: 190-260.>> 160s recently >> 140-183, mostly 140s >> 130-180 >> 119-50's now, but b/f the hospitalization: 130-200 - before lunch: 165-198 >> 82-145 >> 135-193 >> 120-150 - before dinner: 159-182 >> 73-163 >> 107-180 >> 114-180 - bedtime: 162-246 >> 112-254 - last 3 weeks >> n/c No lows.  ? hypoglycemia awareness. Highest sugar was 249 (after cheese cake) x 1.  Pt's meals are: - Breakfast: bacon, eggs, tomato sandwich (sometimes no bread) - Lunch: meat + 2 veggies (no potatoes) - Dinner: meat + 2 veggies - Snacks: 1 a day - 2x a week, icecream  Uses Splenda in tea and coffee.  - mild CKD, last BUN/creatinine:  Lab Results  Component Value Date   BUN 24* 04/01/2013   CREATININE  1.02 04/01/2013  On Losartan. - last set of lipids: Lab Results  Component Value Date   CHOL 140 01/19/2013   HDL 28.80* 01/19/2013   LDLCALC 73 01/19/2013   LDLDIRECT 84.3 12/31/2011   TRIG 192.0* 01/19/2013   CHOLHDL 5 01/19/2013  Not on a statin as intolerant. - last eye exam was > 1 year ago. Dr Antoine Primas. No DR.  - + numbness and tingling in his feet. No sensation in his feet.  He also has a history of Prostate cancer, s/p 25 RxTx, now with radioactive seeds. Also, hyperlipidemia, hypertension, sleep apnea, osteoarthrosis. He had amputation of his big toe in 12/30/2012 2/2 infection >> osteomyelitis.  ROS: Constitutional: + weight loss, no fatigue, no subjective hyperthermia/hypothermia Eyes: no blurry vision, no xerophthalmia ENT: no sore throat, no nodules palpated in throat, no dysphagia/odynophagia, no hoarseness; + decreased hearing Cardiovascular: no CP/SOB/palpitations/+ leg swelling Respiratory: no cough/SOB Gastrointestinal: no N/V/D/no C Musculoskeletal: + muscle/+ joint aches - left lower leg in boot Skin: no rashes Neurological: no tremors/numbness/tingling/dizziness  I reviewed pt's medications, allergies, PMH, social hx, family hx and no changes required, except as mentioned above.  PE: BP 124/78  Pulse 85  Temp(Src) 97.7 F (36.5 C) (Oral)  Resp 12  Wt 294 lb (133.358 kg)  SpO2 96% Wt Readings from Last 3 Encounters:  03/31/13 282 lb 14.4 oz (128.323 kg)  03/31/13 292 lb (132.45 kg)  03/15/13 294 lb (133.358 kg)   Constitutional: obese, in NAD, ruddy complexion Eyes: PERRLA, EOMI, no exophthalmos ENT: moist mucous membranes, no thyromegaly, no cervical lymphadenopathy; large neck Cardiovascular: RRR, No MRG, + bilateral leg edema, pitting. Boot on left leg. Respiratory: CTA B Gastrointestinal: abdomen soft, NT, ND, BS+ Musculoskeletal: no deformities, strength intact in all 4 Skin: moist, warm, stasis dermatitis in bilateral legs  - left lower leg in  boot  ASSESSMENT: 1. DM2, insulin-dependent, uncontrolled, with complications - peripheral neuropathy - CAD-status post stent in 2005, then 5v CABG in 2008 - PVD - history of diabetic foot ulcer - Charcot foot - L - h/o amputation of his big toe in 12/30/2012 2/2 infection >> osteomyelitis. - history of retinal detachment in 2002-3  PLAN:  1. Patient with several years of DM2, recently more controlled diabetes, on basal -bolus insulin regimen + metformin.  -  I suggested to:    Patient Instructions  Continue the current regimen: - Metformin 1000 mg po 2x a day - Levemir 75 units at night - Humalog mealtime:  18 units with a smaller meal 22 units with a larger meal.  Add 3 units with dinner. - Humalog sliding scale: - 150-175: + 1 unit  - 176-200: + 2 units  - 201-225: + 3 units  - 226-250: + 4 units  - 251-275: + 5 units - 276-300: + 6 units  Please return in 3 months.  - continue checking sugars at different times of the day - check 3 times a day, rotating checks - also check at bedtime - advised for yearly eye exams >> need to schedule a new exam! - given flu vaccine this season - Return to clinic in 3 month with sugar log

## 2013-05-10 ENCOUNTER — Other Ambulatory Visit (HOSPITAL_COMMUNITY): Payer: Self-pay | Admitting: Internal Medicine

## 2013-05-11 DIAGNOSIS — L97509 Non-pressure chronic ulcer of other part of unspecified foot with unspecified severity: Secondary | ICD-10-CM | POA: Diagnosis not present

## 2013-05-11 DIAGNOSIS — I70269 Atherosclerosis of native arteries of extremities with gangrene, unspecified extremity: Secondary | ICD-10-CM | POA: Diagnosis not present

## 2013-05-11 DIAGNOSIS — E1149 Type 2 diabetes mellitus with other diabetic neurological complication: Secondary | ICD-10-CM | POA: Diagnosis not present

## 2013-05-24 DIAGNOSIS — E1149 Type 2 diabetes mellitus with other diabetic neurological complication: Secondary | ICD-10-CM | POA: Diagnosis not present

## 2013-05-24 DIAGNOSIS — I70269 Atherosclerosis of native arteries of extremities with gangrene, unspecified extremity: Secondary | ICD-10-CM | POA: Diagnosis not present

## 2013-05-24 DIAGNOSIS — L97509 Non-pressure chronic ulcer of other part of unspecified foot with unspecified severity: Secondary | ICD-10-CM | POA: Diagnosis not present

## 2013-06-02 DIAGNOSIS — M86179 Other acute osteomyelitis, unspecified ankle and foot: Secondary | ICD-10-CM | POA: Diagnosis not present

## 2013-06-02 DIAGNOSIS — L97509 Non-pressure chronic ulcer of other part of unspecified foot with unspecified severity: Secondary | ICD-10-CM | POA: Diagnosis not present

## 2013-06-02 DIAGNOSIS — E1149 Type 2 diabetes mellitus with other diabetic neurological complication: Secondary | ICD-10-CM | POA: Diagnosis not present

## 2013-06-02 DIAGNOSIS — I70269 Atherosclerosis of native arteries of extremities with gangrene, unspecified extremity: Secondary | ICD-10-CM | POA: Diagnosis not present

## 2013-06-07 DIAGNOSIS — I70269 Atherosclerosis of native arteries of extremities with gangrene, unspecified extremity: Secondary | ICD-10-CM | POA: Diagnosis not present

## 2013-06-07 DIAGNOSIS — L97509 Non-pressure chronic ulcer of other part of unspecified foot with unspecified severity: Secondary | ICD-10-CM | POA: Diagnosis not present

## 2013-06-07 DIAGNOSIS — E1149 Type 2 diabetes mellitus with other diabetic neurological complication: Secondary | ICD-10-CM | POA: Diagnosis not present

## 2013-06-14 DIAGNOSIS — R339 Retention of urine, unspecified: Secondary | ICD-10-CM | POA: Diagnosis not present

## 2013-06-14 DIAGNOSIS — R82998 Other abnormal findings in urine: Secondary | ICD-10-CM | POA: Diagnosis not present

## 2013-06-18 DIAGNOSIS — M86179 Other acute osteomyelitis, unspecified ankle and foot: Secondary | ICD-10-CM | POA: Diagnosis not present

## 2013-06-18 DIAGNOSIS — L97509 Non-pressure chronic ulcer of other part of unspecified foot with unspecified severity: Secondary | ICD-10-CM | POA: Diagnosis not present

## 2013-06-25 DIAGNOSIS — C61 Malignant neoplasm of prostate: Secondary | ICD-10-CM | POA: Diagnosis not present

## 2013-06-30 DIAGNOSIS — C61 Malignant neoplasm of prostate: Secondary | ICD-10-CM | POA: Diagnosis not present

## 2013-07-06 ENCOUNTER — Ambulatory Visit (INDEPENDENT_AMBULATORY_CARE_PROVIDER_SITE_OTHER): Payer: Medicare Other | Admitting: Internal Medicine

## 2013-07-06 ENCOUNTER — Encounter: Payer: Self-pay | Admitting: Internal Medicine

## 2013-07-06 VITALS — BP 124/72 | HR 110 | Temp 98.7°F | Resp 12 | Wt 296.0 lb

## 2013-07-06 DIAGNOSIS — E1149 Type 2 diabetes mellitus with other diabetic neurological complication: Secondary | ICD-10-CM

## 2013-07-06 DIAGNOSIS — I2510 Atherosclerotic heart disease of native coronary artery without angina pectoris: Secondary | ICD-10-CM | POA: Diagnosis not present

## 2013-07-06 LAB — HEMOGLOBIN A1C: Hgb A1c MFr Bld: 6.6 % — ABNORMAL HIGH (ref 4.6–6.5)

## 2013-07-06 MED ORDER — METFORMIN HCL 1000 MG PO TABS: 1000.0000 mg | ORAL_TABLET | Freq: Two times a day (BID) | ORAL | Status: DC

## 2013-07-06 MED ORDER — INSULIN DETEMIR 100 UNIT/ML ~~LOC~~ SOLN: 75.0000 [IU] | Freq: Every day | SUBCUTANEOUS | Status: DC

## 2013-07-06 MED ORDER — INSULIN LISPRO 100 UNIT/ML ~~LOC~~ SOLN: 18.0000 [IU] | Freq: Three times a day (TID) | SUBCUTANEOUS | Status: DC

## 2013-07-06 NOTE — Progress Notes (Signed)
Patient ID: [name], male   DOB: 1950/10/20, 63 y.o.   MRN: [id number]  HPI: [name] is a 63 y.o.-year-old male, returning for f/u for DM2, dx 2008, insulin-dependent since dx, uncontrolled, with complications (peripheral neuropathy, CAD-status post CABG in 2008, PVD, Charcot foot, history of diabetic foot ulcer, history of retinal detachment in 2002-3). Last visit 3 mo ago.  His L foot is still in a boot. He was hospitalized for foot ulcer (bottom of L foot) in 03/2013 >> boot. MRI: no Osteomyelitis, had gas gangrene and abscesses >> debrided and tx with ABx. Had more ABx courses since last visit.  Also had several kidney stones since last visit.  Last hemoglobin A1c was: Lab Results  Component Value Date   HGBA1C 7.2* 03/31/2013   HGBA1C 7.6* 12/30/2012   HGBA1C 8.7* 10/05/2012   Pt is on a regimen of: - Metformin 1000 mg po bid - Levemir to 75 units - bottles - Humalog mealtime: 18 units with a smaller meal and 22 units with a larger meal. Add 3 units with dinner. (usually takes ~20 units with the meals) - Humalog sliding scale: - 150-175: + 1 unit  - 176-200: + 2 units  - 201-225: + 3 units  - 226-250: + 4 units  - 251-275: + 5 units - 276-300: + 6 units We stopped Glipizide 5 mg.  Pt checks his sugars 1-3x a day and they are higher - per sugar log: - am: 190-260 >> 160s recently >> 140-183, mostly 140s >> 130-180 >> 119-150's >> 116-203 - before lunch: 165-198 >> 82-145 >> 135-193 >> 120-150 >> n/c - before dinner: 159-182 >> 73-163 >> 107-180 >> 114-180 >> 171-205 - bedtime: 162-246 >> 112-254 - last 3 weeks >> n/c No lows.  ? hypoglycemia awareness. Highest sugar was 205.  Pt's meals are: - Breakfast: bacon, eggs, tomato sandwich (sometimes no bread) - Lunch: meat + 2 veggies (no potatoes) - Dinner: meat + 2 veggies - Snacks: 1 a day - 2x a week, icecream  Uses Splenda in tea and coffee.  - mild CKD, last BUN/creatinine:  Lab Results  Component Value Date    BUN 24* 04/01/2013   CREATININE 1.02 04/01/2013  On Losartan. - last set of lipids: Lab Results  Component Value Date   CHOL 140 01/19/2013   HDL 28.80* 01/19/2013   LDLCALC 73 01/19/2013   LDLDIRECT 84.3 12/31/2011   TRIG 192.0* 01/19/2013   CHOLHDL 5 01/19/2013  Not on a statin as intolerant. - last eye exam was > 1 year ago. Dr Antoine Primas. No DR.  - + numbness and tingling in his feet. No sensation in his feet.  He also has a history of Prostate cancer, s/p 25 RxTx, now with radioactive seeds. Also, hyperlipidemia, hypertension, sleep apnea, osteoarthrosis. He had amputation of his big toe in 12/30/2012 2/2 infection >> osteomyelitis.  ROS: Constitutional: no weight loss/gain, no fatigue, no subjective hyperthermia/hypothermia Eyes: no blurry vision, no xerophthalmia ENT: no sore throat, no nodules palpated in throat, no dysphagia/odynophagia, no hoarseness; + decreased hearing Cardiovascular: no CP/SOB/palpitations/no leg swelling Respiratory: no cough/SOB Gastrointestinal: no N/V/D/no C Musculoskeletal: no muscle/no joint aches - left lower leg in boot Skin: no rashes Neurological: no tremors/numbness/tingling/dizziness  I reviewed pt's medications, allergies, PMH, social hx, family hx and no changes required, except as mentioned above.  PE: BP 124/72  Pulse 110  Temp(Src) 98.7 F (37.1 C) (Oral)  Resp 12  Wt 296 lb (134.265 kg)  SpO2 95% Wt Readings from Last 3 Encounters:  07/06/13 296 lb (134.265 kg)  04/05/13 294 lb (133.358 kg)  03/31/13 282 lb 14.4 oz (128.323 kg)   Constitutional: obese, in NAD, ruddy complexion Eyes: PERRLA, EOMI, no exophthalmos ENT: moist mucous membranes, no thyromegaly, no cervical lymphadenopathy; large neck Cardiovascular: RRR, No MRG, + bilateral leg edema, pitting. Boot on left leg. Respiratory: CTA B Gastrointestinal: abdomen soft, NT, ND, BS+ Musculoskeletal: no deformities, strength intact in all 4 Skin: moist, warm, stasis  dermatitis in bilateral legs  - left lower leg in boot  ASSESSMENT: 1. DM2, insulin-dependent, uncontrolled, with complications - peripheral neuropathy - CAD-status post stent in 2005, then 5v CABG in 2008 - PVD - history of diabetic foot ulcer - Charcot foot - L - h/o amputation of his big toe in 12/30/2012 2/2 infection >> osteomyelitis. - history of retinal detachment in 2002-3  PLAN:  1. Patient with several years of DM2, recently more uncontrolled diabetes (2/2 lack of activity and eating more fruit this summer), on basal -bolus insulin regimen + metformin.  -  I suggested to:    Patient Instructions  Continue the current regimen: - Metformin 1000 mg po 2x a day - Levemir 75 units at night - Increase Humalog at mealtime as follows:  21 units with a smaller meal 24 units with a larger meal or dinner - Please add the Humalog sliding scale: - 150-175: + 1 unit  - 176-200: + 2 units  - 201-225: + 3 units  - 226-250: + 4 units  - 251-275: + 5 units - 276-300: + 6 units  Please return in 3 months.  - continue checking sugars at different times of the day - check 3 times a day, rotating checks - also check at bedtime - advised for yearly eye exams >> need to schedule a new exam! - will check A1c today - refilled DM meds for 3 mo x3 refills - Return to clinic in 3 month with sugar log   Office Visit on 07/06/2013  Component Date Value Ref Range Status  . Hemoglobin A1C 07/06/2013 6.6* 4.6 - 6.5 % Final   Glycemic Control Guidelines for People with Diabetes:Non Diabetic:  <6%Goal of Therapy: <7%Additional Action Suggested:  >8%     Excellent HbA1c!

## 2013-07-06 NOTE — Patient Instructions (Signed)
Continue the current regimen: - Metformin 1000 mg po 2x a day - Levemir 75 units at night - Humalog mealtime:  21 units with a smaller meal 24 units with a larger meal or dinner - Humalog sliding scale: - 150-175: + 1 unit  - 176-200: + 2 units  - 201-225: + 3 units  - 226-250: + 4 units  - 251-275: + 5 units - 276-300: + 6 units   Please stop at the lab.

## 2013-07-16 DIAGNOSIS — M86179 Other acute osteomyelitis, unspecified ankle and foot: Secondary | ICD-10-CM | POA: Diagnosis not present

## 2013-07-16 DIAGNOSIS — L97509 Non-pressure chronic ulcer of other part of unspecified foot with unspecified severity: Secondary | ICD-10-CM | POA: Diagnosis not present

## 2013-07-16 DIAGNOSIS — E1149 Type 2 diabetes mellitus with other diabetic neurological complication: Secondary | ICD-10-CM | POA: Diagnosis not present

## 2013-07-22 DIAGNOSIS — E1149 Type 2 diabetes mellitus with other diabetic neurological complication: Secondary | ICD-10-CM | POA: Diagnosis not present

## 2013-07-22 DIAGNOSIS — L97509 Non-pressure chronic ulcer of other part of unspecified foot with unspecified severity: Secondary | ICD-10-CM | POA: Diagnosis not present

## 2013-08-04 DIAGNOSIS — H25019 Cortical age-related cataract, unspecified eye: Secondary | ICD-10-CM | POA: Diagnosis not present

## 2013-08-04 DIAGNOSIS — E119 Type 2 diabetes mellitus without complications: Secondary | ICD-10-CM | POA: Diagnosis not present

## 2013-08-04 DIAGNOSIS — H2510 Age-related nuclear cataract, unspecified eye: Secondary | ICD-10-CM | POA: Diagnosis not present

## 2013-08-05 DIAGNOSIS — E1149 Type 2 diabetes mellitus with other diabetic neurological complication: Secondary | ICD-10-CM | POA: Diagnosis not present

## 2013-08-05 DIAGNOSIS — L97509 Non-pressure chronic ulcer of other part of unspecified foot with unspecified severity: Secondary | ICD-10-CM | POA: Diagnosis not present

## 2013-08-19 DIAGNOSIS — L97509 Non-pressure chronic ulcer of other part of unspecified foot with unspecified severity: Secondary | ICD-10-CM | POA: Diagnosis not present

## 2013-08-19 DIAGNOSIS — E1149 Type 2 diabetes mellitus with other diabetic neurological complication: Secondary | ICD-10-CM | POA: Diagnosis not present

## 2013-08-25 ENCOUNTER — Other Ambulatory Visit: Payer: Self-pay | Admitting: *Deleted

## 2013-08-25 ENCOUNTER — Telehealth: Payer: Self-pay | Admitting: Internal Medicine

## 2013-08-25 ENCOUNTER — Other Ambulatory Visit: Payer: Self-pay

## 2013-08-25 MED ORDER — METFORMIN HCL 1000 MG PO TABS: 1000.0000 mg | ORAL_TABLET | Freq: Two times a day (BID) | ORAL | Status: DC

## 2013-08-25 MED ORDER — INSULIN DETEMIR 100 UNIT/ML ~~LOC~~ SOLN
75.0000 [IU] | Freq: Every day | SUBCUTANEOUS | Status: DC
Start: 2013-08-25 — End: 2013-08-26

## 2013-08-25 MED ORDER — CARVEDILOL 25 MG PO TABS: 25.0000 mg | ORAL_TABLET | Freq: Two times a day (BID) | ORAL | Status: DC

## 2013-08-25 MED ORDER — LOSARTAN POTASSIUM-HCTZ 50-12.5 MG PO TABS: 1.0000 | ORAL_TABLET | Freq: Every day | ORAL | Status: DC

## 2013-08-25 NOTE — Telephone Encounter (Signed)
Please call in levemir for about 10 days - 1 vial to hold him until [facility] can get his rx to him

## 2013-08-25 NOTE — Telephone Encounter (Signed)
Rx sent per pt's request.  

## 2013-08-26 ENCOUNTER — Other Ambulatory Visit: Payer: Self-pay | Admitting: *Deleted

## 2013-08-26 DIAGNOSIS — E1149 Type 2 diabetes mellitus with other diabetic neurological complication: Secondary | ICD-10-CM | POA: Diagnosis not present

## 2013-08-26 DIAGNOSIS — L97509 Non-pressure chronic ulcer of other part of unspecified foot with unspecified severity: Secondary | ICD-10-CM | POA: Diagnosis not present

## 2013-08-26 MED ORDER — INSULIN DETEMIR 100 UNIT/ML ~~LOC~~ SOLN: 75.0000 [IU] | Freq: Every day | SUBCUTANEOUS | Status: DC

## 2013-08-26 MED ORDER — INSULIN LISPRO 100 UNIT/ML ~~LOC~~ SOLN: 18.0000 [IU] | Freq: Three times a day (TID) | SUBCUTANEOUS | Status: DC

## 2013-08-26 MED ORDER — INSULIN PEN NEEDLE 31G X 5 MM MISC: Status: DC

## 2013-08-31 ENCOUNTER — Other Ambulatory Visit: Payer: Self-pay | Admitting: *Deleted

## 2013-08-31 MED ORDER — INSULIN PEN NEEDLE 31G X 5 MM MISC: Status: DC

## 2013-08-31 MED ORDER — INSULIN DETEMIR 100 UNIT/ML FLEXPEN
PEN_INJECTOR | SUBCUTANEOUS | Status: DC
Start: 2013-08-31 — End: 2013-10-28

## 2013-08-31 MED ORDER — INSULIN LISPRO 100 UNIT/ML (KWIKPEN): PEN_INJECTOR | SUBCUTANEOUS | Status: DC

## 2013-08-31 NOTE — Telephone Encounter (Signed)
Had to change rx to insulin pens and resend to [facility].

## 2013-09-02 DIAGNOSIS — L97929 Non-pressure chronic ulcer of unspecified part of left lower leg with unspecified severity: Secondary | ICD-10-CM | POA: Diagnosis not present

## 2013-09-02 DIAGNOSIS — L97509 Non-pressure chronic ulcer of other part of unspecified foot with unspecified severity: Secondary | ICD-10-CM | POA: Diagnosis not present

## 2013-09-02 DIAGNOSIS — I83219 Varicose veins of right lower extremity with both ulcer of unspecified site and inflammation: Secondary | ICD-10-CM | POA: Diagnosis not present

## 2013-09-02 DIAGNOSIS — E1149 Type 2 diabetes mellitus with other diabetic neurological complication: Secondary | ICD-10-CM | POA: Diagnosis not present

## 2013-09-09 DIAGNOSIS — I83219 Varicose veins of right lower extremity with both ulcer of unspecified site and inflammation: Secondary | ICD-10-CM | POA: Diagnosis not present

## 2013-09-09 DIAGNOSIS — L97509 Non-pressure chronic ulcer of other part of unspecified foot with unspecified severity: Secondary | ICD-10-CM | POA: Diagnosis not present

## 2013-09-09 DIAGNOSIS — E1149 Type 2 diabetes mellitus with other diabetic neurological complication: Secondary | ICD-10-CM | POA: Diagnosis not present

## 2013-09-09 DIAGNOSIS — L97929 Non-pressure chronic ulcer of unspecified part of left lower leg with unspecified severity: Secondary | ICD-10-CM | POA: Diagnosis not present

## 2013-09-16 DIAGNOSIS — E1149 Type 2 diabetes mellitus with other diabetic neurological complication: Secondary | ICD-10-CM | POA: Diagnosis not present

## 2013-09-16 DIAGNOSIS — I83229 Varicose veins of left lower extremity with both ulcer of unspecified site and inflammation: Secondary | ICD-10-CM | POA: Diagnosis not present

## 2013-09-16 DIAGNOSIS — I83219 Varicose veins of right lower extremity with both ulcer of unspecified site and inflammation: Secondary | ICD-10-CM | POA: Diagnosis not present

## 2013-09-24 DIAGNOSIS — L97509 Non-pressure chronic ulcer of other part of unspecified foot with unspecified severity: Secondary | ICD-10-CM | POA: Diagnosis not present

## 2013-09-24 DIAGNOSIS — E1149 Type 2 diabetes mellitus with other diabetic neurological complication: Secondary | ICD-10-CM | POA: Diagnosis not present

## 2013-09-24 DIAGNOSIS — I83219 Varicose veins of right lower extremity with both ulcer of unspecified site and inflammation: Secondary | ICD-10-CM | POA: Diagnosis not present

## 2013-09-30 DIAGNOSIS — E1149 Type 2 diabetes mellitus with other diabetic neurological complication: Secondary | ICD-10-CM | POA: Diagnosis not present

## 2013-09-30 DIAGNOSIS — I83229 Varicose veins of left lower extremity with both ulcer of unspecified site and inflammation: Secondary | ICD-10-CM | POA: Diagnosis not present

## 2013-09-30 DIAGNOSIS — I83219 Varicose veins of right lower extremity with both ulcer of unspecified site and inflammation: Secondary | ICD-10-CM | POA: Diagnosis not present

## 2013-09-30 DIAGNOSIS — L97509 Non-pressure chronic ulcer of other part of unspecified foot with unspecified severity: Secondary | ICD-10-CM | POA: Diagnosis not present

## 2013-10-06 ENCOUNTER — Other Ambulatory Visit: Payer: Self-pay | Admitting: Internal Medicine

## 2013-10-06 ENCOUNTER — Other Ambulatory Visit: Payer: Self-pay | Admitting: *Deleted

## 2013-10-06 ENCOUNTER — Encounter: Payer: Self-pay | Admitting: Internal Medicine

## 2013-10-06 ENCOUNTER — Ambulatory Visit (INDEPENDENT_AMBULATORY_CARE_PROVIDER_SITE_OTHER): Payer: Medicare Other | Admitting: Internal Medicine

## 2013-10-06 VITALS — BP 132/76 | HR 87 | Temp 98.1°F | Resp 12 | Wt 306.0 lb

## 2013-10-06 DIAGNOSIS — Z23 Encounter for immunization: Secondary | ICD-10-CM

## 2013-10-06 DIAGNOSIS — I2510 Atherosclerotic heart disease of native coronary artery without angina pectoris: Secondary | ICD-10-CM

## 2013-10-06 DIAGNOSIS — E1149 Type 2 diabetes mellitus with other diabetic neurological complication: Secondary | ICD-10-CM | POA: Diagnosis not present

## 2013-10-06 NOTE — Progress Notes (Addendum)
Patient ID: LATHEN SEAL, male   DOB: 01/21/1950, 63 y.o.   MRN: [id number]  HPI: [name] is a 63 y.o.-year-old male, returning for f/u for DM2, dx 2008, insulin-dependent since dx, uncontrolled, with complications (peripheral neuropathy, CAD-status post CABG in 2008, PVD, Charcot foot, history of diabetic foot ulcer, history of retinal detachment in 2002-3). Last visit 3 mo ago.  He has [employer], [employer] and supplemental insurance - [place]. Also Part D - [employer].   His L foot is still in a boot >> still needs ABx but the 3 ulcers are better.  Last hemoglobin A1c was: Lab Results  Component Value Date   HGBA1C 6.6* 07/06/2013   HGBA1C 7.2* 03/31/2013   HGBA1C 7.6* 12/30/2012   Pt is on a regimen of: - Metformin 1000 mg po bid - Levemir to 75 units - pen - Humalog mealtime:  21 units with a smaller meal (18-20 in am and 20 with lunch) 24 units with a larger meal (23 with dinner) - Humalog sliding scale- not using: - 150-175: + 1 unit  - 176-200: + 2 units  - 201-225: + 3 units  - 226-250: + 4 units  - 251-275: + 5 units - 276-300: + 6 units We stopped Glipizide 5 mg.  Pt checks his sugars 1-3x a day and they are higher - per sugar log - higher in am. He does not check CBGs later in the day so he does not use the SSI: - am: 190-260 >> 160s recently >> 140-183, mostly 140s >> 130-180 >> 119-150's >> 116-203 >> 154-200, 256 - before lunch: 165-198 >> 82-145 >> 135-193 >> 120-150 >> n/c - before dinner: 159-182 >> 73-163 >> 107-180 >> 114-180 >> 171-205 >> n/c - bedtime: 162-246 >> 112-254 - last 3 weeks >> n/c No lows.  ? hypoglycemia awareness. Highest sugar was 300 x1.  Pt's meals are: - Breakfast: bacon, eggs, tomato sandwich (sometimes no bread) - Lunch: meat + 2 veggies (no potatoes) - Dinner: meat + 2 veggies - Snacks: 1 a day - 2x a week, icecream  Uses Splenda in tea and coffee.  - mild CKD, last BUN/creatinine:  Lab Results  Component Value Date   BUN 24*  04/01/2013   CREATININE 1.02 04/01/2013  On Losartan. - last set of lipids: Lab Results  Component Value Date   CHOL 140 01/19/2013   HDL 28.80* 01/19/2013   LDLCALC 73 01/19/2013   LDLDIRECT 84.3 12/31/2011   TRIG 192.0* 01/19/2013   CHOLHDL 5 01/19/2013  Not on a statin as intolerant. - last eye exam was 08/2013. No DR. Dr Claudean Kinds. - + numbness and tingling in his feet. No sensation in his feet.  He also has a history of Prostate cancer, s/p 25 RxTx, then radioactive seeds. Also, hyperlipidemia, hypertension, sleep apnea, osteoarthrosis. He had amputation of his big toe in 12/30/2012 2/2 infection >> osteomyelitis. He was hospitalized for foot ulcer (bottom of L foot) in 03/2013 >> boot. MRI: no Osteomyelitis, had gas gangrene and abscesses >> debrided and tx with ABx.   ROS: Constitutional: no weight loss/gain, no fatigue, no subjective hyperthermia/hypothermia Eyes: no blurry vision, no xerophthalmia ENT: no sore throat, no nodules palpated in throat, no dysphagia/odynophagia, no hoarseness Cardiovascular: no CP/SOB/palpitations/no leg swelling Respiratory: no cough/SOB Gastrointestinal: no N/V/D/no C Musculoskeletal: no muscle/no joint aches - left lower leg in boot Skin: no rashes Neurological: no tremors/numbness/tingling/dizziness  I reviewed pt's medications, allergies, PMH, social hx, family hx and no changes  required, except as mentioned above.  PE: BP 132/76  Pulse 87  Temp(Src) 98.1 F (36.7 C) (Oral)  Resp 12  Wt 306 lb (138.801 kg)  SpO2 95% Wt Readings from Last 3 Encounters:  10/06/13 306 lb (138.801 kg)  07/06/13 296 lb (134.265 kg)  04/05/13 294 lb (133.358 kg)   Constitutional: obese, in NAD, ruddy complexion Eyes: PERRLA, EOMI, no exophthalmos ENT: moist mucous membranes, no thyromegaly, no cervical lymphadenopathy; large neck Cardiovascular: RRR, No MRG, + bilateral leg edema, pitting. Boot on left leg. Respiratory: CTA B Gastrointestinal: abdomen  soft, NT, ND, BS+ Musculoskeletal: no deformities, strength intact in all 4 Skin: moist, warm, stasis dermatitis in bilateral legs  - left lower leg in boot  ASSESSMENT: 1. DM2, insulin-dependent, uncontrolled, with complications - peripheral neuropathy - CAD-status post stent in 2005, then 5v CABG in 2008 - PVD - history of diabetic foot ulcer - Charcot foot - L - h/o amputation of his big toe in 12/30/2012 2/2 infection >> osteomyelitis. - history of retinal detachment in 2002-3  PLAN:  1. Patient with several years of DM2, recently more uncontrolled diabetes (2/2 lack of activity), on basal -bolus insulin regimen + metformin.  -  I suggested to start checking sugars before all meals and use the SSI as needed:  Patient Instructions  - Continue Metformin 1000 mg po 2x a day - Increase Levemir to 80 units at night - Use Humalog at mealtime as follows:  21 units with a smaller meal 24 units with a larger meal or dinner - Please add the Humalog sliding scale: - 150-175: + 1 unit  - 176-200: + 2 units  - 201-225: + 3 units  - 226-250: + 4 units  - 251-275: + 5 units - 276-300: + 6 units Please return in 3 months. - continue checking sugars at different times of the day - check 3 times a day, rotating checks - also check at bedtime - advised for yearly eye exams >> up to date - will check A1c today - at next visit, can try Toujeo, but he has a 3-mo supply of Levemir pens at home - Return to clinic in 3 month with sugar log   HbA1c pending. I will addend the results when they become available.  Orders Only on 10/06/2013  Component Date Value Ref Range Status  . Hemoglobin A1C 10/06/2013 7.8* <5.7 % Final   Comment:                                                                                                 According to the ADA Clinical Practice Recommendations for 2011, when                          HbA1c is used as a screening test:                                                        >=  6.5%   Diagnostic of Diabetes Mellitus                                     (if abnormal result is confirmed)                                                     5.7-6.4%   Increased risk of developing Diabetes Mellitus                                                     References:Diagnosis and Classification of Diabetes Mellitus,Diabetes                          LTJQ,3009,23(RAQTM 1):S62-S69 and Standards of Medical Care in                                  Diabetes - 2011,Diabetes Care,2011,34 (Suppl 1):S11-S61.                             . Mean Plasma Glucose 10/06/2013 177* <117 mg/dL Final   HbA1c higher >> see plan above. He needs to pay more attention to diet, especially now that he is not active.

## 2013-10-06 NOTE — Patient Instructions (Signed)
-   Continue Metformin 1000 mg po 2x a day - Increase Levemir to 80 units at night - Use Humalog at mealtime as follows:  21 units with a smaller meal 24 units with a larger meal or dinner - Please add the Humalog sliding scale: - 150-175: + 1 unit  - 176-200: + 2 units  - 201-225: + 3 units  - 226-250: + 4 units  - 251-275: + 5 units - 276-300: + 6 units Please return in 3 months. Please stop at [facility] lab downstairs.

## 2013-10-07 LAB — HEMOGLOBIN A1C
HEMOGLOBIN A1C: 7.8 % — AB (ref <5.7)
MEAN PLASMA GLUCOSE: 177 mg/dL — AB (ref <117)

## 2013-10-14 DIAGNOSIS — I83229 Varicose veins of left lower extremity with both ulcer of unspecified site and inflammation: Secondary | ICD-10-CM | POA: Diagnosis not present

## 2013-10-14 DIAGNOSIS — E1142 Type 2 diabetes mellitus with diabetic polyneuropathy: Secondary | ICD-10-CM | POA: Diagnosis not present

## 2013-10-14 DIAGNOSIS — L97421 Non-pressure chronic ulcer of left heel and midfoot limited to breakdown of skin: Secondary | ICD-10-CM | POA: Diagnosis not present

## 2013-10-20 ENCOUNTER — Other Ambulatory Visit: Payer: Self-pay | Admitting: Internal Medicine

## 2013-10-20 NOTE — Telephone Encounter (Signed)
Ok to refill? Not on current med list. Has not been filled since 2014.

## 2013-10-21 NOTE — Telephone Encounter (Signed)
Okay to refill #30 gm x 0

## 2013-10-21 NOTE — Telephone Encounter (Signed)
Rx sent to [facility] pharmacy as ordered.

## 2013-10-28 ENCOUNTER — Encounter (HOSPITAL_COMMUNITY): Payer: Self-pay | Admitting: Pharmacy Technician

## 2013-10-28 DIAGNOSIS — I83229 Varicose veins of left lower extremity with both ulcer of unspecified site and inflammation: Secondary | ICD-10-CM | POA: Diagnosis not present

## 2013-10-28 DIAGNOSIS — E1142 Type 2 diabetes mellitus with diabetic polyneuropathy: Secondary | ICD-10-CM | POA: Diagnosis not present

## 2013-10-28 DIAGNOSIS — L97421 Non-pressure chronic ulcer of left heel and midfoot limited to breakdown of skin: Secondary | ICD-10-CM | POA: Diagnosis not present

## 2013-10-29 ENCOUNTER — Other Ambulatory Visit (HOSPITAL_COMMUNITY): Payer: Self-pay | Admitting: Orthopedic Surgery

## 2013-11-02 ENCOUNTER — Encounter (HOSPITAL_COMMUNITY)
Admission: RE | Admit: 2013-11-02 | Discharge: 2013-11-02 | Disposition: A | Discharge status: Home / Self Care | Payer: Medicare Other | Source: Ambulatory Visit | Attending: Orthopedic Surgery | Admitting: Orthopedic Surgery

## 2013-11-02 ENCOUNTER — Encounter (HOSPITAL_COMMUNITY): Payer: Self-pay

## 2013-11-02 DIAGNOSIS — N521 Erectile dysfunction due to diseases classified elsewhere: Secondary | ICD-10-CM | POA: Diagnosis not present

## 2013-11-02 DIAGNOSIS — L97329 Non-pressure chronic ulcer of left ankle with unspecified severity: Secondary | ICD-10-CM | POA: Diagnosis not present

## 2013-11-02 DIAGNOSIS — C61 Malignant neoplasm of prostate: Secondary | ICD-10-CM | POA: Diagnosis not present

## 2013-11-02 DIAGNOSIS — A5216 Charcot's arthropathy (tabetic): Secondary | ICD-10-CM | POA: Diagnosis not present

## 2013-11-02 DIAGNOSIS — E11610 Type 2 diabetes mellitus with diabetic neuropathic arthropathy: Secondary | ICD-10-CM | POA: Diagnosis not present

## 2013-11-02 DIAGNOSIS — I739 Peripheral vascular disease, unspecified: Secondary | ICD-10-CM | POA: Diagnosis not present

## 2013-11-02 DIAGNOSIS — E669 Obesity, unspecified: Secondary | ICD-10-CM | POA: Diagnosis not present

## 2013-11-02 DIAGNOSIS — G4730 Sleep apnea, unspecified: Secondary | ICD-10-CM | POA: Diagnosis not present

## 2013-11-02 DIAGNOSIS — Z9989 Dependence on other enabling machines and devices: Secondary | ICD-10-CM | POA: Diagnosis not present

## 2013-11-02 DIAGNOSIS — Z87891 Personal history of nicotine dependence: Secondary | ICD-10-CM | POA: Diagnosis not present

## 2013-11-02 DIAGNOSIS — E11621 Type 2 diabetes mellitus with foot ulcer: Secondary | ICD-10-CM | POA: Diagnosis not present

## 2013-11-02 DIAGNOSIS — Z888 Allergy status to other drugs, medicaments and biological substances status: Secondary | ICD-10-CM | POA: Diagnosis not present

## 2013-11-02 DIAGNOSIS — M869 Osteomyelitis, unspecified: Secondary | ICD-10-CM | POA: Diagnosis not present

## 2013-11-02 DIAGNOSIS — I2510 Atherosclerotic heart disease of native coronary artery without angina pectoris: Secondary | ICD-10-CM | POA: Diagnosis not present

## 2013-11-02 LAB — PROTIME-INR
INR: 1.26 (ref 0.00–1.49)
PROTHROMBIN TIME: 15.9 s — AB (ref 11.6–15.2)

## 2013-11-02 LAB — COMPREHENSIVE METABOLIC PANEL
ALBUMIN: 3.7 g/dL (ref 3.5–5.2)
ALT: 16 U/L (ref 0–53)
ANION GAP: 15 (ref 5–15)
AST: 13 U/L (ref 0–37)
Alkaline Phosphatase: 48 U/L (ref 39–117)
BUN: 17 mg/dL (ref 6–23)
CALCIUM: 9.6 mg/dL (ref 8.4–10.5)
CO2: 25 mEq/L (ref 19–32)
CREATININE: 0.99 mg/dL (ref 0.50–1.35)
Chloride: 100 mEq/L (ref 96–112)
GFR calc Af Amer: >90 mL/min (ref >90)
GFR calc non Af Amer: 84 mL/min — ABNORMAL LOW (ref >90)
Glucose, Bld: 219 mg/dL — ABNORMAL HIGH (ref 70–99)
Potassium: 4.3 mEq/L (ref 3.7–5.3)
Sodium: 140 mEq/L (ref 137–147)
Total Bilirubin: 0.3 mg/dL (ref 0.3–1.2)
Total Protein: 7.3 g/dL (ref 6.0–8.3)

## 2013-11-02 LAB — CBC
HEMATOCRIT: 38.8 % — AB (ref 39.0–52.0)
Hemoglobin: 13.3 g/dL (ref 13.0–17.0)
MCH: 29.6 pg (ref 26.0–34.0)
MCHC: 34.3 g/dL (ref 30.0–36.0)
MCV: 86.2 fL (ref 78.0–100.0)
PLATELETS: 174 10*3/uL (ref 150–400)
RBC: 4.5 MIL/uL (ref 4.22–5.81)
RDW: 16.2 % — AB (ref 11.5–15.5)
WBC: 8.3 10*3/uL (ref 4.0–10.5)

## 2013-11-02 LAB — APTT: aPTT: 39 seconds — ABNORMAL HIGH (ref 24–37)

## 2013-11-02 MED ORDER — DEXTROSE 5 % IV SOLN
3.0000 g | INTRAVENOUS | Status: AC
  Administered 2013-11-03: 3 g via INTRAVENOUS
  Filled 2013-11-02: qty 3000

## 2013-11-02 NOTE — Anesthesia Preprocedure Evaluation (Addendum)
Anesthesia Evaluation  Patient identified by MRN, date of birth, ID band Patient awake    Reviewed: NPO status , Patient's Chart, lab work & pertinent test results, reviewed documented beta blocker date and time   Airway Mallampati: III   Neck ROM: Limited    Dental  (+) Teeth Intact   Pulmonary sleep apnea , former smoker (70 pack year hx),  breath sounds clear to auscultation        Cardiovascular + CAD (ACB in past) and + Peripheral Vascular Disease Rhythm:Regular     Neuro/Psych    GI/Hepatic   Endo/Other  diabetes, Poorly Controlled, Type 2, Insulin Dependent, Oral Hypoglycemic AgentsMorbid obesity  Renal/GU      Musculoskeletal   Abdominal (+) + obese,   Peds  Hematology   Anesthesia Other Findings   Reproductive/Obstetrics                          Anesthesia Physical Anesthesia Plan  ASA: III  Anesthesia Plan: General   Post-op Pain Management:    Induction: Intravenous  Airway Management Planned: Oral ETT and LMA  Additional Equipment:   Intra-op Plan:   Post-operative Plan: Extubation in OR  Informed Consent: I have reviewed the patients History and Physical, chart, labs and discussed the procedure including the risks, benefits and alternatives for the proposed anesthesia with the patient or authorized representative who has indicated his/her understanding and acceptance.     Plan Discussed with:   Anesthesia Plan Comments: (No resent cardiac studies.  Has had similar procedure and anesthesia recently without probs.  No cardiac symptoms since ACB.  Glucose this am 220, will check post-op, multi nodal pain rx with Tylenol, Decadron, Lidocaine, precedex if tolerated.)      Anesthesia Quick Evaluation

## 2013-11-02 NOTE — Progress Notes (Signed)
Anesthesia Chart Review:  Patient is a 63 year old male scheduled for left foot partial bone excision, cuboid and medial cuneiform, wound closure on 11/03/13 by Dr. [name].  Diagnosis: Charcot left foot with osteomyelitis. Case is posted for GA. PAT was earlier today. Chart was given to me just before 4 PM this afternoon.  History includes former smoker, CAD s/p CABG '07 (LIMA to LAD, SVG to PDA and PLA, SVG to OM, SVG to DIAG) with post-operative afib, DM2, ED, glaucoma, prostate cancer s/p radioactive see implant (Dr. Alinda Money), HLD, OSA with CPAP, venous insufficiency, obesity (BMI 38), left great toe ray amputation 12/30/12 (MAC/regional).  PCP is Dr. [name].  Endocrinologist is Dr. [name]. Previously saw cardiologist Dr. Johnsie Cancel, but no records seen since 2008.  Meds include: MVI, ASA 325mg , Coreg, Levemir, Humalog, Hyzaar, metformin, naproxen.  EKG on 03/25/13 showed: NSR, cannot rule out inferior infarct (age undetermined). He has had inferior infarct on EKGs dating back to at least 10/04/05 (see Muse).  Preoperative labs noted.  Cr 0.99. H/H 13.3/38.8, glucose 219. PT/INR 15.9/1.26, PTT 39. A1C 7.8 on 10/06/13.   His EKG appears stable, but he does have known CAD without recent cardiology follow-up.  He did tolerate ray amputation within the past year.  I was not asked to evaluated him during his PAT visit this morning. Discussed with anesthesiologist Dr. Therisa Doyne.  At this late hour the day before surgery, will plan for patient to arrive as scheduled.  He will be further evaluated by his assigned anesthesiologist to determine definitive anesthesia plan and if further evaluation is needed prior to proceeding with this procedure.  He has been told to take his b-blocker on the morning of surgery.    George Hugh [facility]/Anesthesiology Phone [phone] 11/02/2013 4:35 PM

## 2013-11-02 NOTE — Pre-Procedure Instructions (Signed)
[name]  11/02/2013   Your procedure is scheduled on:  11/03/13  Report to [facility] Admitting at 830 AM.  Call this number if you have problems the morning of surgery: [phone]   Remember:   Do not eat food or drink liquids after midnight.   Take these medicines the morning of surgery with A SIP OF WATER: carvedilol   Do not wear jewelry, make-up or nail polish.  Do not wear lotions, powders, or perfumes. You may wear deodorant.  Do not shave 48 hours prior to surgery. Men may shave face and neck.  Do not bring valuables to the hospital.  [facility] is not responsible                  for any belongings or valuables.               Contacts, dentures or bridgework may not be worn into surgery.  Leave suitcase in the car. After surgery it may be brought to your room.  For patients admitted to the hospital, discharge time is determined by your                treatment team.               Patients discharged the day of surgery will not be allowed to drive  home.  Name and phone number of your driver:   Special Instructions: Incentive Spirometry - Practice and bring it with you on the day of surgery.   Please read over the following fact sheets that you were given: Pain Booklet, Coughing and Deep Breathing and Surgical Site Infection Prevention

## 2013-11-03 ENCOUNTER — Encounter (HOSPITAL_COMMUNITY): Payer: Self-pay | Admitting: *Deleted

## 2013-11-03 ENCOUNTER — Encounter (HOSPITAL_COMMUNITY)
Admission: RE | Disposition: A | Discharge status: Home / Self Care | Payer: Self-pay | Source: Ambulatory Visit | Attending: Orthopedic Surgery

## 2013-11-03 ENCOUNTER — Observation Stay (HOSPITAL_COMMUNITY)
Admission: RE | Admit: 2013-11-03 | Discharge: 2013-11-04 | Disposition: A | Discharge status: Home / Self Care | Payer: Medicare Other | Source: Ambulatory Visit | Attending: Orthopedic Surgery | Admitting: Orthopedic Surgery

## 2013-11-03 ENCOUNTER — Encounter (HOSPITAL_COMMUNITY): Payer: Medicare Other | Admitting: Vascular Surgery

## 2013-11-03 ENCOUNTER — Ambulatory Visit (HOSPITAL_COMMUNITY): Payer: Medicare Other | Admitting: Anesthesiology

## 2013-11-03 DIAGNOSIS — E669 Obesity, unspecified: Secondary | ICD-10-CM | POA: Insufficient documentation

## 2013-11-03 DIAGNOSIS — N521 Erectile dysfunction due to diseases classified elsewhere: Secondary | ICD-10-CM | POA: Insufficient documentation

## 2013-11-03 DIAGNOSIS — Z9989 Dependence on other enabling machines and devices: Secondary | ICD-10-CM | POA: Insufficient documentation

## 2013-11-03 DIAGNOSIS — E11621 Type 2 diabetes mellitus with foot ulcer: Secondary | ICD-10-CM | POA: Diagnosis not present

## 2013-11-03 DIAGNOSIS — A5216 Charcot's arthropathy (tabetic): Secondary | ICD-10-CM | POA: Diagnosis not present

## 2013-11-03 DIAGNOSIS — M1460 Charcot's joint, unspecified site: Secondary | ICD-10-CM | POA: Diagnosis not present

## 2013-11-03 DIAGNOSIS — Z888 Allergy status to other drugs, medicaments and biological substances status: Secondary | ICD-10-CM | POA: Insufficient documentation

## 2013-11-03 DIAGNOSIS — L97509 Non-pressure chronic ulcer of other part of unspecified foot with unspecified severity: Secondary | ICD-10-CM

## 2013-11-03 DIAGNOSIS — M869 Osteomyelitis, unspecified: Secondary | ICD-10-CM | POA: Diagnosis not present

## 2013-11-03 DIAGNOSIS — L97329 Non-pressure chronic ulcer of left ankle with unspecified severity: Secondary | ICD-10-CM | POA: Insufficient documentation

## 2013-11-03 DIAGNOSIS — E11610 Type 2 diabetes mellitus with diabetic neuropathic arthropathy: Secondary | ICD-10-CM | POA: Diagnosis not present

## 2013-11-03 DIAGNOSIS — I739 Peripheral vascular disease, unspecified: Secondary | ICD-10-CM | POA: Insufficient documentation

## 2013-11-03 DIAGNOSIS — I2510 Atherosclerotic heart disease of native coronary artery without angina pectoris: Secondary | ICD-10-CM | POA: Insufficient documentation

## 2013-11-03 DIAGNOSIS — E1169 Type 2 diabetes mellitus with other specified complication: Secondary | ICD-10-CM

## 2013-11-03 DIAGNOSIS — G4730 Sleep apnea, unspecified: Secondary | ICD-10-CM | POA: Insufficient documentation

## 2013-11-03 DIAGNOSIS — Z87891 Personal history of nicotine dependence: Secondary | ICD-10-CM | POA: Insufficient documentation

## 2013-11-03 DIAGNOSIS — M86272 Subacute osteomyelitis, left ankle and foot: Secondary | ICD-10-CM | POA: Diagnosis not present

## 2013-11-03 DIAGNOSIS — C61 Malignant neoplasm of prostate: Secondary | ICD-10-CM | POA: Insufficient documentation

## 2013-11-03 HISTORY — PX: FOOT BONE EXCISION: SUR493

## 2013-11-03 HISTORY — PX: I&D EXTREMITY: SHX5045

## 2013-11-03 LAB — GLUCOSE, CAPILLARY
GLUCOSE-CAPILLARY: 286 mg/dL — AB (ref 70–99)
GLUCOSE-CAPILLARY: 278 mg/dL — AB (ref 70–99)
Glucose-Capillary: 227 mg/dL — ABNORMAL HIGH (ref 70–99)
Glucose-Capillary: 194 mg/dL — ABNORMAL HIGH (ref 70–99)

## 2013-11-03 SURGERY — IRRIGATION AND DEBRIDEMENT EXTREMITY
Anesthesia: General | Site: Foot | Laterality: Left

## 2013-11-03 MED ORDER — CEFAZOLIN SODIUM-DEXTROSE 2-3 GM-% IV SOLR
2.0000 g | Freq: Four times a day (QID) | INTRAVENOUS | Status: AC
  Administered 2013-11-03 (×2): 2 g via INTRAVENOUS
  Filled 2013-11-03 (×4): qty 50

## 2013-11-03 MED ORDER — METHOCARBAMOL 500 MG PO TABS: 500.0000 mg | ORAL_TABLET | Freq: Four times a day (QID) | ORAL | Status: DC | PRN

## 2013-11-03 MED ORDER — METHOCARBAMOL 1000 MG/10ML IJ SOLN
500.0000 mg | Freq: Four times a day (QID) | INTRAVENOUS | Status: DC | PRN
  Filled 2013-11-03: qty 5

## 2013-11-03 MED ORDER — LIDOCAINE HCL (CARDIAC) 20 MG/ML IV SOLN
INTRAVENOUS | Status: AC
  Filled 2013-11-03: qty 5

## 2013-11-03 MED ORDER — METFORMIN HCL 500 MG PO TABS
1000.0000 mg | ORAL_TABLET | Freq: Two times a day (BID) | ORAL | Status: DC
  Administered 2013-11-03 – 2013-11-04 (×2): 1000 mg via ORAL
  Filled 2013-11-03 (×4): qty 2

## 2013-11-03 MED ORDER — DOCUSATE SODIUM 100 MG PO CAPS
100.0000 mg | ORAL_CAPSULE | Freq: Two times a day (BID) | ORAL | Status: DC
  Administered 2013-11-03 – 2013-11-04 (×3): 100 mg via ORAL
  Filled 2013-11-03 (×2): qty 1

## 2013-11-03 MED ORDER — ASPIRIN EC 325 MG PO TBEC
325.0000 mg | DELAYED_RELEASE_TABLET | Freq: Every day | ORAL | Status: DC
  Administered 2013-11-03 – 2013-11-04 (×2): 325 mg via ORAL
  Filled 2013-11-03 (×2): qty 1

## 2013-11-03 MED ORDER — MIDAZOLAM HCL 5 MG/5ML IJ SOLN
INTRAMUSCULAR | Status: DC | PRN
  Administered 2013-11-03: 2 mg via INTRAVENOUS

## 2013-11-03 MED ORDER — OXYCODONE-ACETAMINOPHEN 5-325 MG PO TABS
1.0000 | ORAL_TABLET | ORAL | Status: DC | PRN
  Administered 2013-11-04: 1 via ORAL
  Filled 2013-11-03: qty 1

## 2013-11-03 MED ORDER — ASPIRIN 325 MG PO TABS: 325.0000 mg | ORAL_TABLET | Freq: Every day | ORAL | Status: DC

## 2013-11-03 MED ORDER — METOCLOPRAMIDE HCL 5 MG/ML IJ SOLN: 5.0000 mg | Freq: Three times a day (TID) | INTRAMUSCULAR | Status: DC | PRN

## 2013-11-03 MED ORDER — FENTANYL CITRATE 0.05 MG/ML IJ SOLN
INTRAMUSCULAR | Status: AC
  Filled 2013-11-03: qty 5

## 2013-11-03 MED ORDER — LOSARTAN POTASSIUM 50 MG PO TABS
50.0000 mg | ORAL_TABLET | Freq: Every day | ORAL | Status: DC
  Administered 2013-11-03 – 2013-11-04 (×2): 50 mg via ORAL
  Filled 2013-11-03 (×2): qty 1

## 2013-11-03 MED ORDER — HYDROCHLOROTHIAZIDE 12.5 MG PO CAPS
12.5000 mg | ORAL_CAPSULE | Freq: Every day | ORAL | Status: DC
  Administered 2013-11-03 – 2013-11-04 (×2): 12.5 mg via ORAL
  Filled 2013-11-03 (×2): qty 1

## 2013-11-03 MED ORDER — ROCURONIUM BROMIDE 50 MG/5ML IV SOLN
INTRAVENOUS | Status: AC
  Filled 2013-11-03: qty 1

## 2013-11-03 MED ORDER — DEXAMETHASONE SODIUM PHOSPHATE 4 MG/ML IJ SOLN
INTRAMUSCULAR | Status: AC
  Filled 2013-11-03: qty 2

## 2013-11-03 MED ORDER — FENTANYL CITRATE 0.05 MG/ML IJ SOLN
INTRAMUSCULAR | Status: DC | PRN
  Administered 2013-11-03: 25 ug via INTRAVENOUS

## 2013-11-03 MED ORDER — PROPOFOL 10 MG/ML IV BOLUS
INTRAVENOUS | Status: DC | PRN
  Administered 2013-11-03: 170 mg via INTRAVENOUS

## 2013-11-03 MED ORDER — MEPERIDINE HCL 25 MG/ML IJ SOLN: 6.2500 mg | INTRAMUSCULAR | Status: DC | PRN

## 2013-11-03 MED ORDER — INSULIN DETEMIR 100 UNIT/ML ~~LOC~~ SOLN
80.0000 [IU] | Freq: Every day | SUBCUTANEOUS | Status: DC
  Administered 2013-11-03: 80 [IU] via SUBCUTANEOUS
  Filled 2013-11-03 (×2): qty 0.8

## 2013-11-03 MED ORDER — ONDANSETRON HCL 4 MG/2ML IJ SOLN: 4.0000 mg | Freq: Four times a day (QID) | INTRAMUSCULAR | Status: DC | PRN

## 2013-11-03 MED ORDER — LACTATED RINGERS IV SOLN
INTRAVENOUS | Status: DC
  Administered 2013-11-03: 09:00:00 via INTRAVENOUS

## 2013-11-03 MED ORDER — SODIUM CHLORIDE 0.9 % IV SOLN
INTRAVENOUS | Status: DC
  Administered 2013-11-03: 16:00:00 via INTRAVENOUS

## 2013-11-03 MED ORDER — FENTANYL CITRATE 0.05 MG/ML IJ SOLN: 25.0000 ug | INTRAMUSCULAR | Status: DC | PRN

## 2013-11-03 MED ORDER — PROPOFOL 10 MG/ML IV BOLUS
INTRAVENOUS | Status: AC
Start: 2013-11-03 — End: 2013-11-03
  Filled 2013-11-03: qty 20

## 2013-11-03 MED ORDER — ONDANSETRON HCL 4 MG PO TABS: 4.0000 mg | ORAL_TABLET | Freq: Four times a day (QID) | ORAL | Status: DC | PRN

## 2013-11-03 MED ORDER — LIDOCAINE HCL (CARDIAC) 20 MG/ML IV SOLN
INTRAVENOUS | Status: DC | PRN
  Administered 2013-11-03: 100 mg via INTRAVENOUS

## 2013-11-03 MED ORDER — LOSARTAN POTASSIUM-HCTZ 50-12.5 MG PO TABS: 1.0000 | ORAL_TABLET | Freq: Every day | ORAL | Status: DC

## 2013-11-03 MED ORDER — INSULIN ASPART 100 UNIT/ML ~~LOC~~ SOLN
0.0000 [IU] | Freq: Three times a day (TID) | SUBCUTANEOUS | Status: DC
  Administered 2013-11-03: 8 [IU] via SUBCUTANEOUS
  Administered 2013-11-04: 5 [IU] via SUBCUTANEOUS

## 2013-11-03 MED ORDER — INSULIN ASPART 100 UNIT/ML ~~LOC~~ SOLN
4.0000 [IU] | Freq: Three times a day (TID) | SUBCUTANEOUS | Status: DC
  Administered 2013-11-03 – 2013-11-04 (×2): 4 [IU] via SUBCUTANEOUS

## 2013-11-03 MED ORDER — MIDAZOLAM HCL 2 MG/2ML IJ SOLN
INTRAMUSCULAR | Status: AC
  Filled 2013-11-03: qty 2

## 2013-11-03 MED ORDER — SODIUM CHLORIDE 0.9 % IR SOLN
Status: DC | PRN
  Administered 2013-11-03: 1

## 2013-11-03 MED ORDER — HYDROMORPHONE HCL 1 MG/ML IJ SOLN: 0.5000 mg | INTRAMUSCULAR | Status: DC | PRN

## 2013-11-03 MED ORDER — PROMETHAZINE HCL 25 MG/ML IJ SOLN: 6.2500 mg | INTRAMUSCULAR | Status: DC | PRN

## 2013-11-03 MED ORDER — METOCLOPRAMIDE HCL 5 MG PO TABS: 5.0000 mg | ORAL_TABLET | Freq: Three times a day (TID) | ORAL | Status: DC | PRN

## 2013-11-03 MED ORDER — CARVEDILOL 25 MG PO TABS
25.0000 mg | ORAL_TABLET | Freq: Two times a day (BID) | ORAL | Status: DC
  Administered 2013-11-03 – 2013-11-04 (×2): 25 mg via ORAL
  Filled 2013-11-03 (×3): qty 1

## 2013-11-03 SURGICAL SUPPLY — 42 items
BLADE SURG 10 STRL SS (BLADE) IMPLANT
BNDG COHESIVE 4X5 TAN STRL (GAUZE/BANDAGES/DRESSINGS) IMPLANT
BNDG COHESIVE 6X5 TAN STRL LF (GAUZE/BANDAGES/DRESSINGS) ×2 IMPLANT
BNDG GAUZE ELAST 4 BULKY (GAUZE/BANDAGES/DRESSINGS) ×2 IMPLANT
COVER SURGICAL LIGHT HANDLE (MISCELLANEOUS) ×2 IMPLANT
CUFF TOURNIQUET SINGLE 18IN (TOURNIQUET CUFF) IMPLANT
CUFF TOURNIQUET SINGLE 24IN (TOURNIQUET CUFF) IMPLANT
CUFF TOURNIQUET SINGLE 34IN LL (TOURNIQUET CUFF) IMPLANT
CUFF TOURNIQUET SINGLE 44IN (TOURNIQUET CUFF) IMPLANT
DRAPE U-SHAPE 47X51 STRL (DRAPES) ×2 IMPLANT
DRSG ADAPTIC 3X8 NADH LF (GAUZE/BANDAGES/DRESSINGS) ×4 IMPLANT
DURAPREP 26ML APPLICATOR (WOUND CARE) ×2 IMPLANT
ELECT CAUTERY BLADE 6.4 (BLADE) IMPLANT
ELECT REM PT RETURN 9FT ADLT (ELECTROSURGICAL)
ELECTRODE REM PT RTRN 9FT ADLT (ELECTROSURGICAL) IMPLANT
GAUZE SPONGE 4X4 12PLY STRL (GAUZE/BANDAGES/DRESSINGS) ×2 IMPLANT
GLOVE BIOGEL PI IND STRL 9 (GLOVE) ×1 IMPLANT
GLOVE BIOGEL PI INDICATOR 9 (GLOVE) ×1
GLOVE SURG ORTHO 9.0 STRL STRW (GLOVE) ×2 IMPLANT
GOWN STRL REUS W/ TWL XL LVL3 (GOWN DISPOSABLE) ×2 IMPLANT
GOWN STRL REUS W/TWL XL LVL3 (GOWN DISPOSABLE) ×2
HANDPIECE INTERPULSE COAX TIP (DISPOSABLE)
KIT BASIN OR (CUSTOM PROCEDURE TRAY) ×2 IMPLANT
KIT ROOM TURNOVER OR (KITS) ×2 IMPLANT
MANIFOLD NEPTUNE II (INSTRUMENTS) ×2 IMPLANT
NS IRRIG 1000ML POUR BTL (IV SOLUTION) ×2 IMPLANT
PACK ORTHO EXTREMITY (CUSTOM PROCEDURE TRAY) ×2 IMPLANT
PAD ABD 8X10 STRL (GAUZE/BANDAGES/DRESSINGS) ×2 IMPLANT
PAD ARMBOARD 7.5X6 YLW CONV (MISCELLANEOUS) ×4 IMPLANT
PADDING CAST COTTON 6X4 STRL (CAST SUPPLIES) ×2 IMPLANT
SET HNDPC FAN SPRY TIP SCT (DISPOSABLE) IMPLANT
SPONGE GAUZE 4X4 12PLY STER LF (GAUZE/BANDAGES/DRESSINGS) ×2 IMPLANT
SPONGE LAP 18X18 X RAY DECT (DISPOSABLE) ×2 IMPLANT
STOCKINETTE IMPERVIOUS 9X36 MD (GAUZE/BANDAGES/DRESSINGS) IMPLANT
SUT ETHILON 2 0 PSLX (SUTURE) ×8 IMPLANT
TOWEL OR 17X24 6PK STRL BLUE (TOWEL DISPOSABLE) ×2 IMPLANT
TOWEL OR 17X26 10 PK STRL BLUE (TOWEL DISPOSABLE) ×2 IMPLANT
TUBE ANAEROBIC SPECIMEN COL (MISCELLANEOUS) IMPLANT
TUBE CONNECTING 12X1/4 (SUCTIONS) ×2 IMPLANT
UNDERPAD 30X30 INCONTINENT (UNDERPADS AND DIAPERS) ×2 IMPLANT
WATER STERILE IRR 1000ML POUR (IV SOLUTION) ×2 IMPLANT
YANKAUER SUCT BULB TIP NO VENT (SUCTIONS) ×2 IMPLANT

## 2013-11-03 NOTE — Transfer of Care (Signed)
Immediate Anesthesia Transfer of Care Note  Patient: [name]  Procedure(s) Performed: Procedure(s): Left Foot Partial Bone Excision Cuboid and Medial Cuneiform, Wound Closures (Left)  Patient Location: PACU  Anesthesia Type:General  Level of Consciousness: awake, alert , oriented and patient cooperative  Airway & Oxygen Therapy: Patient Spontanous Breathing and Patient connected to nasal cannula oxygen  Post-op Assessment: Report given to PACU RN, Post -op Vital signs reviewed and stable and Patient moving all extremities  Post vital signs: Reviewed and stable  Complications: No apparent anesthesia complications

## 2013-11-03 NOTE — Op Note (Signed)
11/03/2013  10:52 AM  PATIENT:  [name]    PRE-OPERATIVE DIAGNOSIS:  Charcot Left Foot with Osteomyelitis  POST-OPERATIVE DIAGNOSIS:  Same  PROCEDURE:  Left Foot Partial Bone Excision Cuboid and Medial Cuneiform,  Wound Closures with local tissue rearrangement for one wound 3 x 7 cm second wound 3 x 6 cm.  SURGEON:  [name], MD  PHYSICIAN ASSISTANT:None ANESTHESIA:   General  PREOPERATIVE INDICATIONS:  [name] is a  63 y.o. male with a diagnosis of Charcot Left Foot with Osteomyelitis who failed conservative measures and elected for surgical management.    The risks benefits and alternatives were discussed with the patient preoperatively including but not limited to the risks of infection, bleeding, nerve injury, cardiopulmonary complications, the need for revision surgery, among others, and the patient was willing to proceed.  OPERATIVE IMPLANTS: None  OPERATIVE FINDINGS: Good petechial bleeding  OPERATIVE PROCEDURE: Patient was brought to the operating room and underwent a general anesthetic. After adequate levels of anesthesia were obtained patient's left lower extremity was prepped using DuraPrep draped into a sterile field. A longitudinal extensile elliptical incision was made around the ulcer over the medial cuneiform. The ulcer skin soft tissue and medial cuneiform were resected in 1 block of tissue. Hemostasis was obtained. Local tissue rearrangement was performed to close the wound with 2-0 nylon with the wound 3 x 7 cm. Attention was then focused to the plantar aspect of his foot. A longitudinal incision was made into the ulcer and the cuboid were resected in 1 block of tissue. There is no signs of any deep abscess no signs of any deep further osteomyelitis. The wounds were irrigated and local tissue rearrangement was performed with the wound 3 x 6 cm the wound was closed with 2-0 nylon. The wounds were covered with a compressive sterile dressing. Patient also  had venous stasis changes and a sterile compressive wrap was applied from the tibial tubercle to the toes. Patient was extubated taken to the PACU in stable condition.

## 2013-11-03 NOTE — H&P (Signed)
[name] is an 63 y.o. male.   Chief Complaint: Charcot collapse left foot with ulceration HPI: Patient is a 63 year old gentleman diabetic insensate neuropathy with Charcot collapse with progressive ulceration on the plantar and medial aspect of the left foot. Patient has failed conservative treatment with wound care and pressure unloading.  Past Medical History  Diagnosis Date  . Malignant neoplasm of prostate   . Coronary atherosclerosis of unspecified type of vessel, native or graft   . Type II or unspecified type diabetes mellitus with neurological manifestations, not stated as uncontrolled   . Impotence of organic origin   . Unspecified glaucoma   . Internal hemorrhoids without mention of complication   . Other and unspecified hyperlipidemia   . Mononeuritis of unspecified site   . Osteoarthrosis, unspecified whether generalized or localized, unspecified site   . Personal history of colonic polyps   . Routine general medical examination at a health care facility   . Hemorrhage of rectum and anus   . Unspecified venous (peripheral) insufficiency   . Personal history of gallstones   . Personal history of diabetic foot ulcer     saw wound center, resolved 05/08/2010  . Unspecified sleep apnea     [facility]    Past Surgical History  Procedure Laterality Date  . Cardiac catheterization  1998    Negative  . Thrombosed vein  1993    Right leg  . Kidney stone surgery  04/1993  . Retinal detachment surgery  2002-2003  . Rca stents  04/2003    EF 55%  . Coronary artery bypass graft  09/2005    Post op AFIB  . Insertion prostate radiation seed  2009    RT and seeds for prostate cancer  . Amputation Left 12/30/2012    Procedure: AMPUTATION RAY ;  Surgeon: Meredith Pel, MD;  Location: [facility];  Service: Orthopedics;  Laterality: Left;  LEFT GREAT TOE RAY AMPUTATION  . Eye surgery      Family History  Problem Relation Age of Onset  . Lung cancer Father   . Multiple sclerosis  Mother   . Heart attack      paternal aunts and uncles  . Peripheral vascular disease Maternal Grandfather     several amputations   Social History:  reports that he quit smoking about 12 years ago. His smoking use included Cigars and Cigarettes. He has a 70 pack-year smoking history. He has never used smokeless tobacco. He reports that he drinks alcohol. He reports that he does not use illicit drugs.  Allergies:  Allergies  Allergen Reactions  . Atorvastatin Other (See Comments)    REACTION: myalgias  . Ezetimibe Other (See Comments)    Body ache  . Simvastatin Other (See Comments)    Body ache    No prescriptions prior to admission    Results for orders placed during the hospital encounter of 11/02/13 (from the past 48 hour(s))  APTT     Status: Abnormal   Collection Time    11/02/13  9:29 AM      Result Value Ref Range   aPTT 39 (*) 24 - 37 seconds   Comment:            IF BASELINE aPTT IS ELEVATED,     SUGGEST PATIENT RISK ASSESSMENT     BE USED TO DETERMINE APPROPRIATE     ANTICOAGULANT THERAPY.  CBC     Status: Abnormal   Collection Time    11/02/13  9:29 AM      Result Value Ref Range   WBC 8.3  4.0 - 10.5 K/uL   RBC 4.50  4.22 - 5.81 MIL/uL   Hemoglobin 13.3  13.0 - 17.0 g/dL   HCT 38.8 (*) 39.0 - 52.0 %   MCV 86.2  78.0 - 100.0 fL   MCH 29.6  26.0 - 34.0 pg   MCHC 34.3  30.0 - 36.0 g/dL   RDW 16.2 (*) 11.5 - 15.5 %   Platelets 174  150 - 400 K/uL  COMPREHENSIVE METABOLIC PANEL     Status: Abnormal   Collection Time    11/02/13  9:29 AM      Result Value Ref Range   Sodium 140  137 - 147 mEq/L   Potassium 4.3  3.7 - 5.3 mEq/L   Chloride 100  96 - 112 mEq/L   CO2 25  19 - 32 mEq/L   Glucose, Bld 219 (*) 70 - 99 mg/dL   BUN 17  6 - 23 mg/dL   Creatinine, Ser 0.99  0.50 - 1.35 mg/dL   Calcium 9.6  8.4 - 10.5 mg/dL   Total Protein 7.3  6.0 - 8.3 g/dL   Albumin 3.7  3.5 - 5.2 g/dL   AST 13  0 - 37 U/L   ALT 16  0 - 53 U/L   Alkaline Phosphatase 48  39  - 117 U/L   Total Bilirubin 0.3  0.3 - 1.2 mg/dL   GFR calc non Af Amer 84 (*) >90 mL/min   GFR calc Af Amer >90  >90 mL/min   Comment: (NOTE)     The eGFR has been calculated using the CKD EPI equation.     This calculation has not been validated in all clinical situations.     eGFR's persistently <90 mL/min signify possible Chronic Kidney     Disease.   Anion gap 15  5 - 15  PROTIME-INR     Status: Abnormal   Collection Time    11/02/13  9:29 AM      Result Value Ref Range   Prothrombin Time 15.9 (*) 11.6 - 15.2 seconds   INR 1.26  0.00 - 1.49   No results found.  Review of Systems  All other systems reviewed and are negative.   There were no vitals taken for this visit. Physical Exam   On examination patient has a medial and plantar ulcer he has a rocker-bottom deformity with a pronated valgus midfoot and hindfoot. Assessment/Plan Assessment: Diabetic insensate neuropathy and Charcot collapse with ulceration with rocker bottom deformity left foot.  Plan: Will plan for excision of the bony prominence with wound closure. Risk and benefits were discussed including infection neurovascular injury nonhealing of the wounds surgery. Patient states he understands and wishes to proceed at this time.  DUDA,MARCUS V 11/03/2013, 6:45 AM

## 2013-11-03 NOTE — Anesthesia Postprocedure Evaluation (Signed)
  Anesthesia Post-op Note  Patient: [name]  Procedure(s) Performed: Procedure(s): Left Foot Partial Bone Excision Cuboid and Medial Cuneiform, Wound Closures (Left)  Patient Location: PACU  Anesthesia Type:General  Level of Consciousness: awake and alert   Airway and Oxygen Therapy: Patient Spontanous Breathing and Patient connected to nasal cannula oxygen  Post-op Pain: mild  Post-op Assessment: Post-op Vital signs reviewed, Patient's Cardiovascular Status Stable, Respiratory Function Stable and Patent Airway  Post-op Vital Signs: Reviewed and stable  Last Vitals:  Filed Vitals:   11/03/13 0910  BP: 145/66  Pulse: 88  Temp: 36.8 C  Resp: 20    Complications: No apparent anesthesia complications

## 2013-11-03 NOTE — Anesthesia Procedure Notes (Signed)
Procedure Name: LMA Insertion Date/Time: 11/03/2013 10:19 AM Performed by: Julian Reil Pre-anesthesia Checklist: Patient identified, Emergency Drugs available, Suction available and Patient being monitored Patient Re-evaluated:Patient Re-evaluated prior to inductionOxygen Delivery Method: Circle system utilized Preoxygenation: Pre-oxygenation with 100% oxygen Intubation Type: IV induction Ventilation: Mask ventilation without difficulty LMA: LMA inserted LMA Size: 5.0 Tube type: Oral Number of attempts: 1 Placement Confirmation: positive ETCO2 and breath sounds checked- equal and bilateral Tube secured with: Tape Dental Injury: Teeth and Oropharynx as per pre-operative assessment

## 2013-11-04 DIAGNOSIS — A5216 Charcot's arthropathy (tabetic): Secondary | ICD-10-CM | POA: Diagnosis not present

## 2013-11-04 LAB — GLUCOSE, CAPILLARY: GLUCOSE-CAPILLARY: 225 mg/dL — AB (ref 70–99)

## 2013-11-04 MED ORDER — OXYCODONE-ACETAMINOPHEN 5-325 MG PO TABS: 1.0000 | ORAL_TABLET | ORAL | Status: DC | PRN

## 2013-11-04 NOTE — Discharge Instructions (Signed)
Minimize weightbearing left foot

## 2013-11-04 NOTE — Discharge Summary (Signed)
  Discharge to home in stable condition. Final diagnosis ulceration with osteomyelitis with Charcot collapse secondary to diabetes left foot. Prescription for Percocet for pain. Hospital course was essentially unremarkable. Patient did have bleeding through the dressing the first postoperative night. Dressing was changed before discharge. Prescription provided for Percocet for pain.

## 2013-11-04 NOTE — Evaluation (Signed)
Physical Therapy Evaluation and Discharge Patient Details Name: [name] MRN: [id number] DOB: February 12, 1950 Today's Date: 11/04/2013   History of Present Illness  63 y.o. male s/p Left Foot Partial Bone Excision Cuboid and Medial Cuneiform, Wound Closures  Clinical Impression  Patient evaluated by Physical Therapy with no further acute PT needs identified. All education has been completed and the patient has no further questions. Ambulates generally well with supervision and use of a rolling walker to maintain weight bearing status on LLE. Safely completed stair training with significant other present and actively participating. See below for any follow-up Physial Therapy or equipment needs. PT is signing off. Thank you for this referral.     Follow Up Recommendations No PT follow up    Equipment Recommendations  3in1 (PT)    Recommendations for Other Services       Precautions / Restrictions Precautions Precautions: Fall Required Braces or Orthoses: Other Brace/Splint (CAM Boot) Restrictions Weight Bearing Restrictions: Yes LLE Weight Bearing: Touchdown weight bearing      Mobility  Bed Mobility Overal bed mobility: Modified Independent                Transfers Overall transfer level: Needs assistance Equipment used: Rolling walker (2 wheeled) Transfers: Sit to/from Stand Sit to Stand: Supervision         General transfer comment: Supervision for safety. VC for hand and foot placement. Safely maintainds TWB on LLE. No loss of balance.  Ambulation/Gait Ambulation/Gait assistance: Supervision Ambulation Distance (Feet): 50 Feet Assistive device: Rolling walker (2 wheeled) Gait Pattern/deviations: Step-to pattern;Decreased step length - right;Decreased stance time - left;Antalgic;Trunk flexed   Gait velocity interpretation: Below normal speed for age/gender General Gait Details: Educated on safe DME use with a rolling walker. VC for sequencing and to  maintain TWB status on LLE. VC for upright posture. No loss of balance noted and demonstrates good control of RW.  Stairs Stairs: Yes Stairs assistance: Min assist Stair Management: No rails;Step to pattern;Backwards;With walker Number of Stairs: 2 (x2) General stair comments: Demonstrated technique to pt and signficant other prior to having them practice. VC for sequencing and technique. Pt and signficant other able to correctly teach back on second trail. Min assist to block RW only.  Wheelchair Mobility    Modified Rankin (Stroke Patients Only)       Balance Overall balance assessment: Needs assistance Sitting-balance support: No upper extremity supported;Feet supported Sitting balance-Leahy Scale: Good     Standing balance support: Bilateral upper extremity supported Standing balance-Leahy Scale: Poor                               Pertinent Vitals/Pain Pain Assessment: No/denies pain    Home Living Family/patient expects to be discharged to:: Private residence Living Arrangements: Spouse/significant other Available Help at Discharge: Friend(s);Available 24 hours/day Type of Home: House Home Access: Stairs to enter Entrance Stairs-Rails: Psychiatric nurse of Steps: 3 Home Layout: One level Home Equipment: Walker - 2 wheels;Shower seat      Prior Function Level of Independence: Needs assistance   Gait / Transfers Assistance Needed: uses RW  ADL's / Homemaking Assistance Needed: Bath himself - assist with dressing        Hand Dominance        Extremity/Trunk Assessment   Upper Extremity Assessment: Defer to OT evaluation           Lower Extremity Assessment: LLE deficits/detail  LLE Deficits / Details: Decreased strength and ROM as expected post op - limited assessment due to bandaging.     Communication   Communication: No difficulties  Cognition Arousal/Alertness: Awake/alert Behavior During Therapy: WFL for tasks  assessed/performed Overall Cognitive Status: Within Functional Limits for tasks assessed                      General Comments      Exercises General Exercises - Lower Extremity Ankle Circles/Pumps: AROM;Both;5 reps;Seated Gluteal Sets: Strengthening;5 reps;Seated Long Arc Quad: Strengthening;5 reps;Seated Hip Flexion/Marching: Strengthening;5 reps;Seated      Assessment/Plan    PT Assessment Patent does not need any further PT services  PT Diagnosis Difficulty walking;Abnormality of gait   PT Problem List    PT Treatment Interventions     PT Goals (Current goals can be found in the Care Plan section) Acute Rehab PT Goals Patient Stated Goal: Go home PT Goal Formulation: All assessment and education complete, DC therapy    Frequency     Barriers to discharge        Co-evaluation               End of Session Equipment Utilized During Treatment: Gait belt;Other (comment) (CAM boot) Activity Tolerance: Patient tolerated treatment well Patient left: in chair;with call bell/phone within reach;with family/visitor present Nurse Communication: Mobility status    Functional Assessment Tool Used: clinical observation Functional Limitation: Mobility: Walking and moving around Mobility: Walking and Moving Around Current Status ([id number]): At least 20 percent but less than 40 percent impaired, limited or restricted Mobility: Walking and Moving Around Goal Status [phone]): At least 20 percent but less than 40 percent impaired, limited or restricted Mobility: Walking and Moving Around Discharge Status [phone]): At least 20 percent but less than 40 percent impaired, limited or restricted    Time: 0928-1004 (-7 minutes non-therapeutic while Ortho tech donned boot.) PT Time Calculation (min): 36 min   Charges:   PT Evaluation $Initial PT Evaluation Tier I: 1 Procedure PT Treatments $Gait Training: 8-22 mins   PT G Codes:   Functional Assessment Tool Used: clinical  observation Functional Limitation: Mobility: Walking and moving around   [place], [place]  Ellouise Newer 11/04/2013, 10:58 AM

## 2013-11-04 NOTE — Progress Notes (Signed)
Orthopedic Tech Progress Note Patient Details:  [name] 1950/10/01 [id number] CAM walker applied; tolerated well Ortho Devices Type of Ortho Device: CAM walker Ortho Device/Splint Location: LLE Ortho Device/Splint Interventions: Application   [name] 11/04/2013, 9:51 AM

## 2013-11-04 NOTE — Care Management (Signed)
11-04-13 Patient states he has 3 n 1 , and walker already at home . Magdalen Spatz RN BSN

## 2013-11-04 NOTE — Progress Notes (Signed)
Patient discharged to home with instructions, dressings changed as ordered.

## 2013-11-05 ENCOUNTER — Encounter (HOSPITAL_COMMUNITY): Payer: Self-pay | Admitting: Orthopedic Surgery

## 2013-12-13 ENCOUNTER — Telehealth: Payer: Self-pay | Admitting: Internal Medicine

## 2013-12-13 NOTE — Telephone Encounter (Signed)
Patient states that per his last visit Dr. [name] was to take Mr. Music off his levemir  Patient was advised to finish the 90 day supply and call our office to get new Rx    Please advise patient    Thank you

## 2013-12-13 NOTE — Telephone Encounter (Signed)
OK, let's stop Levemir (can take it off his med list) and call in Toujeo 80 units every day. I hope this is covered by his insurance.

## 2013-12-13 NOTE — Telephone Encounter (Signed)
Called [name] and he said that Dr [name] mentioned changing to [facility]. Please advise.

## 2013-12-14 ENCOUNTER — Other Ambulatory Visit: Payer: Self-pay | Admitting: *Deleted

## 2013-12-14 MED ORDER — INSULIN GLARGINE 300 UNIT/ML ~~LOC~~ SOPN: 80.0000 [IU] | PEN_INJECTOR | Freq: Every day | SUBCUTANEOUS | Status: DC

## 2013-12-16 ENCOUNTER — Telehealth: Payer: Self-pay | Admitting: Internal Medicine

## 2013-12-16 ENCOUNTER — Other Ambulatory Visit: Payer: Self-pay | Admitting: *Deleted

## 2013-12-16 MED ORDER — INSULIN GLARGINE 300 UNIT/ML ~~LOC~~ SOPN: 80.0000 [IU] | PEN_INJECTOR | Freq: Every day | SUBCUTANEOUS | Status: DC

## 2013-12-16 NOTE — Telephone Encounter (Signed)
Patient would like to have [facility] call him    Thank you

## 2013-12-21 LAB — HM DIABETES EYE EXAM

## 2014-01-06 ENCOUNTER — Encounter: Payer: Self-pay | Admitting: Internal Medicine

## 2014-01-06 ENCOUNTER — Ambulatory Visit (INDEPENDENT_AMBULATORY_CARE_PROVIDER_SITE_OTHER): Payer: Medicare Other | Admitting: Internal Medicine

## 2014-01-06 ENCOUNTER — Other Ambulatory Visit: Payer: Self-pay | Admitting: *Deleted

## 2014-01-06 VITALS — BP 138/88 | HR 90 | Temp 98.2°F | Resp 12 | Wt 302.0 lb

## 2014-01-06 DIAGNOSIS — E1140 Type 2 diabetes mellitus with diabetic neuropathy, unspecified: Secondary | ICD-10-CM

## 2014-01-06 DIAGNOSIS — I2510 Atherosclerotic heart disease of native coronary artery without angina pectoris: Secondary | ICD-10-CM | POA: Diagnosis not present

## 2014-01-06 LAB — HEMOGLOBIN A1C
HEMOGLOBIN A1C: 7.7 % — AB (ref <5.7)
Mean Plasma Glucose: 174 mg/dL — ABNORMAL HIGH (ref <117)

## 2014-01-06 NOTE — Progress Notes (Signed)
Patient ID: [name], male   DOB: 1950/05/02, 63 y.o.   MRN: [id number]  HPI: [name] is a 63 y.o.-year-old male, returning for f/u for DM2, dx 2008, insulin-dependent since dx, uncontrolled, with complications (peripheral neuropathy, CAD-status post CABG in 2008, PVD, Charcot foot, history of diabetic foot ulcer, history of retinal detachment in 2002-3). Last visit 3 mo ago.  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   He had 3 foot ulcers >> osteomyelitis >> had L partial bone excision 11/03/2013 - Dr [name].  He also eliminate a kidney stone 12/30/2013.  Last hemoglobin A1c was: Lab Results  Component Value Date   HGBA1C 7.8* 10/06/2013   HGBA1C 6.6* 07/06/2013   HGBA1C 7.2* 03/31/2013   Pt is on a regimen of: - Metformin 1000 mg po bid - Toujeo 80 units at night - Humalog at mealtime as follows:  21 units with a smaller meal 25 units with a larger meal or dinner - Humalog sliding scale: - 150-175: + 1 unit  - 176-200: + 2 units  - 201-225: + 3 units  - 226-250: + 4 units  - 251-275: + 5 units   - 276-300: + 6 units We stopped Glipizide 5 mg.  Pt checks his sugars 1-3x a day and they are higher - per sugar log: - am: 130-180 >> 119-150's >> 116-203 >> 154-200, 256 >> 144-230 - before lunch: 165-198 >> 82-145 >> 135-193 >> 120-150 >> n/c >> 215-245 - before dinner: 159-182 >> 73-163 >> 107-180 >> 114-180 >> 171-205 >> n/c >> 140-178 - bedtime: 162-246 >> 112-254 - last 3 weeks >> n/c >> 244 No lows.  ? hypoglycemia awareness. Highest sugar was 300 x1.  Pt's meals are: - Breakfast: bacon, eggs, tomato sandwich (sometimes no bread) - Lunch: meat + 2 veggies (no potatoes) - Dinner: meat + 2 veggies - Snacks: 1 a day - 2x a week, icecream  Uses Splenda in tea and coffee.  - mild CKD, last BUN/creatinine:  Lab Results  Component Value Date   BUN 17 11/02/2013   CREATININE 0.99 11/02/2013  On Losartan. - last set of  lipids: Lab Results  Component Value Date   CHOL 140 01/19/2013   HDL 28.80* 01/19/2013   LDLCALC 73 01/19/2013   LDLDIRECT 84.3 12/31/2011   TRIG 192.0* 01/19/2013   CHOLHDL 5 01/19/2013  Not on a statin as intolerant. - last eye exam was 08/2013. No DR. Dr Claudean Kinds. - + numbness and tingling in his feet. No sensation in his feet.  He also has a history of Prostate cancer, s/p 25 RxTx, then radioactive seeds. Also, hyperlipidemia, hypertension, sleep apnea, osteoarthrosis. He had amputation of his big toe in 12/30/2012 2/2 infection >> osteomyelitis. He was hospitalized for foot ulcer (bottom of L foot) in 03/2013 >> boot. MRI: no Osteomyelitis, had gas gangrene and abscesses >> debrided and tx with ABx.   ROS: Constitutional: no weight loss/gain, no fatigue, no subjective hyperthermia/hypothermia Eyes: no blurry vision, no xerophthalmia ENT: no sore throat, no nodules palpated in throat, no dysphagia/odynophagia, no hoarseness Cardiovascular: no CP/SOB/palpitations/no leg swelling Respiratory: no cough/SOB Gastrointestinal: no N/V/D/no C Musculoskeletal: no muscle/no joint aches - left lower leg in boot Skin: no rashes Neurological: no tremors/numbness/tingling/dizziness  I reviewed pt's medications, allergies, PMH, social hx, family hx and no changes required, except as mentioned above.  PE: BP 138/88 mmHg  Pulse 90  Temp(Src) 98.2 F (36.8 C) (Oral)  Resp 12  Wt 302 lb (136.986 kg)  SpO2 97% Wt Readings from Last 3 Encounters:  01/06/14 302 lb (136.986 kg)  11/03/13 310 lb (140.615 kg)  11/02/13 296 lb (134.265 kg)   Constitutional: obese, in NAD, ruddy complexion Eyes: PERRLA, EOMI, no exophthalmos ENT: moist mucous membranes, no thyromegaly, no cervical lymphadenopathy; large neck Cardiovascular: RRR, No MRG, + bilateral leg edema, pitting. Boot on left leg.  Respiratory: CTA B Gastrointestinal: abdomen soft, NT, ND, BS+ Musculoskeletal: no deformities, strength  intact in all 4 Skin: moist, warm, stasis dermatitis in bilateral legs  - left lower leg in boot  ASSESSMENT: 1. DM2, insulin-dependent, uncontrolled, with complications - peripheral neuropathy - CAD-status post stent in 2005, then 5v CABG in 2008 - PVD - history of diabetic foot ulcer - Charcot foot - L - h/o amputation of his big toe in 12/30/2012 2/2 infection >> osteomyelitis. - history of retinal detachment in 2002-3  PLAN:  1. Patient with several years of DM2, recently more uncontrolled diabetes (2/2 kidney stone and L foot infection), on basal -bolus insulin regimen + metformin.  -  I suggested to start checking sugars before all meals and will increase his Humalog  Patient Instructions  - Continue Metformin 1000 mg po 2x a day - Continue Toujeo 80 units at night - Increase Humalog at mealtime as follows:  25 units with a smaller meal 30 units with a larger meal or dinner - Continue Humalog sliding scale: - 150-175: + 1 unit  - 176-200: + 2 units  - 201-225: + 3 units  - 226-250: + 4 units  - 251-275: + 5 units - 276-300: + 6 units Please return in 3 months. Please stop at the lab. - continue checking sugars at different times of the day - check 3 times a day, rotating checks - also check at bedtime - given more sugar logs - advised for yearly eye exams >> up to date - will check A1c today - Return to clinic in 3 month with sugar log   Orders Only on 01/06/2014  Component Date Value Ref Range Status  . Hgb A1c MFr Bld 01/06/2014 7.7* <5.7 % Final   Comment:                                                                        According to the ADA Clinical Practice Recommendations for 2011, when HbA1c is used as a screening test:     >=6.5%   Diagnostic of Diabetes Mellitus            (if abnormal result is confirmed)   5.7-6.4%   Increased risk of developing Diabetes Mellitus   References:Diagnosis and Classification of Diabetes  [place],Diabetes XKGY,1856,31(SHFWY 1):S62-S69 and Standards of Medical Care in         Diabetes - 2011,Diabetes OVZC,5885,02 (Suppl 1):S11-S61.     . Mean Plasma Glucose 01/06/2014 174* <117 mg/dL Final  HbA1c only slightly better >> see changes in insulin regimen above.

## 2014-01-06 NOTE — Patient Instructions (Signed)
Patient Instructions  - Continue Metformin 1000 mg po 2x a day - Continue Toujeo 80 units at night - Increase Humalog at mealtime as follows:  25 units with a smaller meal 30 units with a larger meal or dinner - Continue Humalog sliding scale: - 150-175: + 1 unit  - 176-200: + 2 units  - 201-225: + 3 units  - 226-250: + 4 units  - 251-275: + 5 units - 276-300: + 6 units Please return in 3 months. Please stop at the lab.

## 2014-01-19 ENCOUNTER — Other Ambulatory Visit: Payer: Self-pay | Admitting: Internal Medicine

## 2014-01-19 NOTE — Telephone Encounter (Signed)
Pt request status of losartan HCTZ refill; advised done and sent to [facility]. Pt voiced understanding.

## 2014-01-21 ENCOUNTER — Encounter: Payer: No Typology Code available for payment source | Admitting: Internal Medicine

## 2014-01-26 ENCOUNTER — Encounter: Payer: No Typology Code available for payment source | Admitting: Internal Medicine

## 2014-01-27 ENCOUNTER — Other Ambulatory Visit: Payer: Self-pay | Admitting: Internal Medicine

## 2014-02-07 ENCOUNTER — Ambulatory Visit (INDEPENDENT_AMBULATORY_CARE_PROVIDER_SITE_OTHER): Payer: Medicare Other | Admitting: Internal Medicine

## 2014-02-07 ENCOUNTER — Encounter: Payer: Self-pay | Admitting: Internal Medicine

## 2014-02-07 VITALS — BP 142/82 | HR 96 | Temp 98.1°F | Resp 16 | Ht 74.0 in | Wt 309.5 lb

## 2014-02-07 DIAGNOSIS — I2510 Atherosclerotic heart disease of native coronary artery without angina pectoris: Secondary | ICD-10-CM | POA: Diagnosis not present

## 2014-02-07 DIAGNOSIS — C61 Malignant neoplasm of prostate: Secondary | ICD-10-CM

## 2014-02-07 DIAGNOSIS — Z0000 Encounter for general adult medical examination without abnormal findings: Secondary | ICD-10-CM | POA: Diagnosis not present

## 2014-02-07 DIAGNOSIS — E1140 Type 2 diabetes mellitus with diabetic neuropathy, unspecified: Secondary | ICD-10-CM

## 2014-02-07 DIAGNOSIS — Z89422 Acquired absence of other left toe(s): Secondary | ICD-10-CM

## 2014-02-07 DIAGNOSIS — Z23 Encounter for immunization: Secondary | ICD-10-CM | POA: Diagnosis not present

## 2014-02-07 DIAGNOSIS — E785 Hyperlipidemia, unspecified: Secondary | ICD-10-CM | POA: Diagnosis not present

## 2014-02-07 DIAGNOSIS — E11610 Type 2 diabetes mellitus with diabetic neuropathic arthropathy: Secondary | ICD-10-CM

## 2014-02-07 DIAGNOSIS — Z7189 Other specified counseling: Secondary | ICD-10-CM | POA: Diagnosis not present

## 2014-02-07 HISTORY — PX: BASAL CELL CARCINOMA EXCISION: SHX1214

## 2014-02-07 LAB — HM DIABETES FOOT EXAM

## 2014-02-07 NOTE — Assessment & Plan Note (Signed)
See social history 

## 2014-02-07 NOTE — Assessment & Plan Note (Signed)
I have personally reviewed the Medicare Annual Wellness questionnaire and have noted 1. The patient's medical and social history 2. Their use of alcohol, tobacco or illicit drugs 3. Their current medications and supplements 4. The patient's functional ability including ADL's, fall risks, home safety risks and hearing or visual             impairment. 5. Diet and physical activities 6. Evidence for depression or mood disorders  The patients weight, height, BMI and visual acuity have been recorded in the chart I have made referrals, counseling and provided education to the patient based review of the above and I have provided the pt with a written personalized care plan for preventive services.  I have provided you with a copy of your personalized plan for preventive services. Please take the time to review along with your updated medication list.  Colonoscopy due 2019 prevnar today Otherwise UTD

## 2014-02-07 NOTE — Progress Notes (Signed)
Subjective:    Patient ID: [name], male    DOB: 06/24/1950, 64 y.o.   MRN: [id number]  HPI Here with fiancee for Medicare wellness and follow up of diabetes and other chronic medical conditions Reviewed form and advanced directives Reviewed other physicians Not able to exercise No alcohol or tobacco No falls No depression or anhedonia Able to drive and do instrumental ADLs Hearing is not great--no big change Vision is okay No cognitive problems  Had osteomyelitis after foot infection Needed bone removed in October --- 2 separate spots Now has been heeling well Apparently circulation is still okay  Diabetic control is still okay Continues with Dr [name] Control not as good this past time due to multiple kidney stones He did pass them  Still sees Dr Alinda Money PSAs have been under 1 so still no active treatment for now Voiding okay. Awakens once at 5am to void  No chest pain No SOB No dizziness or syncope Mild pedal edema--better than in past  Has arthritis "everywhere" Not too bad Uses aleve only prn (not that often)  Current Outpatient Prescriptions on File Prior to Visit  Medication Sig Dispense Refill  . aspirin 325 MG tablet Take 325 mg by mouth daily.      . B-D UF III MINI PEN NEEDLES 31G X 5 MM MISC USE TO INJECT INSULIN 4 TIMES DAILY AS INSTRUCTED FOR DIABETES 360 each 1  . carvedilol (COREG) 25 MG tablet Take 1 tablet (25 mg total) by mouth 2 (two) times daily. 180 tablet 3  . FREESTYLE INSULINX TEST test strip 1 each by Other route See admin instructions. Check blood sugar 3 times daily.    . Insulin Glargine (TOUJEO SOLOSTAR) 300 UNIT/ML SOPN Inject 80 Units into the skin daily. 16 pen 1  . insulin lispro (HUMALOG) 100 UNIT/ML injection Inject 25-30 Units into the skin 3 (three) times daily.    Marland Kitchen losartan-hydrochlorothiazide (HYZAAR) 50-12.5 MG per tablet TAKE ONE BY MOUTH DAILY 30 tablet 0  . metFORMIN (GLUCOPHAGE) 1000 MG tablet Take 1 tablet (1,000  mg total) by mouth 2 (two) times daily. 180 tablet 3  . Multiple Vitamin (MULTIVITAMIN) tablet Take 1 tablet by mouth daily.      . naproxen sodium (ANAPROX) 220 MG tablet Take 440 mg by mouth 2 (two) times daily as needed (for pain.). For pain    . oxyCODONE-acetaminophen (ROXICET) 5-325 MG per tablet Take 1 tablet by mouth every 4 (four) hours as needed for severe pain. 60 tablet 0  . oxyCODONE-acetaminophen (ROXICET) 5-325 MG per tablet Take 1 tablet by mouth every 4 (four) hours as needed for severe pain. 60 tablet 0  . silver sulfADIAZINE (SILVADENE) 1 % cream   3  . triamcinolone (KENALOG) 0.025 % cream Apply 1 application topically 3 (three) times daily as needed (for raw skin).     No current facility-administered medications on file prior to visit.    Allergies  Allergen Reactions  . Atorvastatin Other (See Comments)    REACTION: myalgias  . Ezetimibe Other (See Comments)    Body ache  . Simvastatin Other (See Comments)    Body ache    Past Medical History  Diagnosis Date  . Malignant neoplasm of prostate   . Coronary atherosclerosis of unspecified type of vessel, native or graft   . Type II or unspecified type diabetes mellitus with neurological manifestations, not stated as uncontrolled   . Impotence of organic origin   . Unspecified glaucoma   .  Internal hemorrhoids without mention of complication   . Other and unspecified hyperlipidemia   . Mononeuritis of unspecified site   . Osteoarthrosis, unspecified whether generalized or localized, unspecified site   . Personal history of colonic polyps   . Routine general medical examination at a health care facility   . Hemorrhage of rectum and anus   . Unspecified venous (peripheral) insufficiency   . Personal history of gallstones   . Personal history of diabetic foot ulcer     saw wound center, resolved 05/08/2010  . Unspecified sleep apnea     [facility]    Past Surgical History  Procedure Laterality Date  . Cardiac  catheterization  1998    Negative  . Thrombosed vein  1993    Right leg  . Kidney stone surgery  04/1993  . Retinal detachment surgery  2002-2003  . Rca stents  04/2003    EF 55%  . Coronary artery bypass graft  09/2005    Post op AFIB  . Insertion prostate radiation seed  2009    RT and seeds for prostate cancer  . Amputation Left 12/30/2012    Procedure: AMPUTATION RAY ;  Surgeon: Meredith Pel, MD;  Location: [facility];  Service: Orthopedics;  Laterality: Left;  LEFT GREAT TOE RAY AMPUTATION  . Eye surgery    . Foot bone excision Left 11/03/2013    DR DUDA   . I&d extremity Left 11/03/2013    Procedure: Left Foot Partial Bone Excision Cuboid and Medial Cuneiform, Wound Closures;  Surgeon: [name], MD;  Location: [place];  Service: Orthopedics;  Laterality: Left;    Family History  Problem Relation Age of Onset  . Lung cancer Father   . Multiple sclerosis Mother   . Heart attack      paternal aunts and uncles  . Peripheral vascular disease Maternal Grandfather     several amputations    History   Social History  . Marital Status: Widowed    Spouse Name: N/A    Number of Children: 3  . Years of Education: N/A   Occupational History  . Heavy equipment mechanic--retired    Social History Main Topics  . Smoking status: Former Smoker -- 2.00 packs/day for 35 years    Types: Cigars, Cigarettes    Quit date: 01/07/2001  . Smokeless tobacco: Never Used  . Alcohol Use: Yes     Comment: rare  . Drug Use: No  . Sexual Activity:    Partners: Female   Other Topics Concern  . Not on file   Social History Narrative   Has living will   Daughter [name] is [facility] POA    Would accept resuscitation attempts--but no prolonged artificial ventilation   No tube feeds if cognitively unaware   Review of Systems Appetite "too good" Weight fluctuates but certainly not down Sleeps okay Bowels move well---loose at times and he has to be careful with diet Teeth  okay--regular with dentist     Objective:   Physical Exam  Constitutional: He is oriented to person, place, and time. He appears well-developed. No distress.  HENT:  Mouth/Throat: Oropharynx is clear and moist. No oropharyngeal exudate.  Neck: Normal range of motion. Neck supple. No thyromegaly present.  Cardiovascular: Normal rate, regular rhythm, normal heart sounds and intact distal pulses.  Exam reveals no gallop.   No murmur heard. Pulmonary/Chest: Effort normal and breath sounds normal. No respiratory distress. He has no wheezes. He has  no rales.  Abdominal: Soft. There is no tenderness.  Musculoskeletal:  Trace to 1+ pitting in both feet and calves  Lymphadenopathy:    He has no cervical adenopathy.  Neurological: He is alert and oriented to person, place, and time.  President -- "Elyn Peers, [place]" [phone] [id number] Recall 3/3  ~no sensation in feet   Skin:  Small open area at bottom of left foot--no inflammation Apparent skin cancer on left arm  Psychiatric: He has a normal mood and affect. His behavior is normal.          Assessment & Plan:

## 2014-02-07 NOTE — Assessment & Plan Note (Signed)
Amputation site is clean and dry

## 2014-02-07 NOTE — Progress Notes (Signed)
Pre visit review using our clinic review tool, if applicable. No additional management support is needed unless otherwise documented below in the visit note. 

## 2014-02-07 NOTE — Assessment & Plan Note (Signed)
Follows with Dr Alinda Money Hasn't needed Rx

## 2014-02-07 NOTE — Assessment & Plan Note (Signed)
On aspirin, ARB, beta blocker No statin due to low LDL lately

## 2014-02-07 NOTE — Patient Instructions (Signed)
Please set up appointment with a dermatologist to take that spot off your left arm. Let me know if you have trouble getting an appointment.

## 2014-02-07 NOTE — Assessment & Plan Note (Signed)
Has been good without statin Due for labs

## 2014-02-07 NOTE — Assessment & Plan Note (Signed)
Lab Results  Component Value Date   HGBA1C 7.7* 01/06/2014   Control is acceptable now Continues with Dr [name]

## 2014-02-07 NOTE — Addendum Note (Signed)
Addended by: Geni Bers on: 02/07/2014 03:26 PM   Modules accepted: Orders

## 2014-02-07 NOTE — Assessment & Plan Note (Signed)
Healing from surgery in fall Still not able to do any exercise

## 2014-02-08 LAB — LIPID PANEL
CHOLESTEROL: 145 mg/dL (ref 0–200)
HDL: 27.4 mg/dL — AB (ref >39.00)
NONHDL: 117.6
Total CHOL/HDL Ratio: 5
Triglycerides: 397 mg/dL — ABNORMAL HIGH (ref 0.0–149.0)
VLDL: 79.4 mg/dL — AB (ref 0.0–40.0)

## 2014-02-08 LAB — T4, FREE: Free T4: 0.88 ng/dL (ref 0.60–1.60)

## 2014-02-08 LAB — CBC WITH DIFFERENTIAL/PLATELET
Basophils Absolute: 0 10*3/uL (ref 0.0–0.1)
Basophils Relative: 0.1 % (ref 0.0–3.0)
Eosinophils Absolute: 0.5 10*3/uL (ref 0.0–0.7)
Eosinophils Relative: 4.9 % (ref 0.0–5.0)
HCT: 41.6 % (ref 39.0–52.0)
HEMOGLOBIN: 14.3 g/dL (ref 13.0–17.0)
Lymphocytes Relative: 13.6 % (ref 12.0–46.0)
Lymphs Abs: 1.4 10*3/uL (ref 0.7–4.0)
MCHC: 34.4 g/dL (ref 30.0–36.0)
MCV: 84.3 fl (ref 78.0–100.0)
MONO ABS: 0.4 10*3/uL (ref 0.1–1.0)
Monocytes Relative: 4.2 % (ref 3.0–12.0)
Neutro Abs: 8 10*3/uL — ABNORMAL HIGH (ref 1.4–7.7)
Neutrophils Relative %: 77.2 % — ABNORMAL HIGH (ref 43.0–77.0)
PLATELETS: 230 10*3/uL (ref 150.0–400.0)
RBC: 4.94 Mil/uL (ref 4.22–5.81)
RDW: 17.5 % — ABNORMAL HIGH (ref 11.5–15.5)
WBC: 10.4 10*3/uL (ref 4.0–10.5)

## 2014-02-08 LAB — COMPREHENSIVE METABOLIC PANEL
ALK PHOS: 41 U/L (ref 39–117)
ALT: 25 U/L (ref 0–53)
AST: 16 U/L (ref 0–37)
Albumin: 4.4 g/dL (ref 3.5–5.2)
BILIRUBIN TOTAL: 0.4 mg/dL (ref 0.2–1.2)
BUN: 22 mg/dL (ref 6–23)
CO2: 27 meq/L (ref 19–32)
Calcium: 10.1 mg/dL (ref 8.4–10.5)
Chloride: 104 mEq/L (ref 96–112)
Creatinine, Ser: 1.26 mg/dL (ref 0.40–1.50)
GFR: 60.91 mL/min (ref >60.00)
Glucose, Bld: 153 mg/dL — ABNORMAL HIGH (ref 70–99)
POTASSIUM: 4.8 meq/L (ref 3.5–5.1)
Sodium: 138 mEq/L (ref 135–145)
TOTAL PROTEIN: 7.1 g/dL (ref 6.0–8.3)

## 2014-02-08 LAB — LDL CHOLESTEROL, DIRECT: Direct LDL: 82 mg/dL

## 2014-02-09 ENCOUNTER — Encounter: Payer: Self-pay | Admitting: *Deleted

## 2014-02-14 DIAGNOSIS — L97421 Non-pressure chronic ulcer of left heel and midfoot limited to breakdown of skin: Secondary | ICD-10-CM | POA: Diagnosis not present

## 2014-02-14 DIAGNOSIS — E1142 Type 2 diabetes mellitus with diabetic polyneuropathy: Secondary | ICD-10-CM | POA: Diagnosis not present

## 2014-02-14 DIAGNOSIS — I83229 Varicose veins of left lower extremity with both ulcer of unspecified site and inflammation: Secondary | ICD-10-CM | POA: Diagnosis not present

## 2014-02-15 ENCOUNTER — Encounter: Payer: Self-pay | Admitting: Internal Medicine

## 2014-02-15 ENCOUNTER — Ambulatory Visit (INDEPENDENT_AMBULATORY_CARE_PROVIDER_SITE_OTHER): Payer: Medicare Other | Admitting: Internal Medicine

## 2014-02-15 VITALS — BP 122/72 | HR 91 | Temp 97.9°F | Resp 12 | Wt 311.0 lb

## 2014-02-15 DIAGNOSIS — I2510 Atherosclerotic heart disease of native coronary artery without angina pectoris: Secondary | ICD-10-CM

## 2014-02-15 DIAGNOSIS — E1140 Type 2 diabetes mellitus with diabetic neuropathy, unspecified: Secondary | ICD-10-CM | POA: Diagnosis not present

## 2014-02-15 NOTE — Progress Notes (Addendum)
Patient ID: [name], male   DOB: 06/29/1950, 64 y.o.   MRN: [id number]  HPI: [name] is a 64 y.o.-year-old male, returning for f/u for DM2, dx 2008, insulin-dependent since dx, uncontrolled, with complications (peripheral neuropathy, CAD-status post CABG in 2008, PVD, Charcot foot, history of diabetic foot ulcer, history of retinal detachment in 2002-3). Last visit 1.5 mo ago.  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 Also Part D - [employer].   He had 3 foot ulcers >> osteomyelitis >> had L partial bone excision 11/03/2013 - Dr [name]. He has a h/o Charcot foot. At this visit, he needs a foot exam for Diabetic shoes.  Last hemoglobin A1c was: Lab Results  Component Value Date   HGBA1C 7.7* 01/06/2014   HGBA1C 7.8* 10/06/2013   HGBA1C 6.6* 07/06/2013   Pt is on a regimen of: - Metformin 1000 mg po bid - Toujeo 80 units at night - Humalog at mealtime as follows:  25 units with a smaller meal 30 units with a larger meal or dinner - Humalog sliding scale: - 150-175: + 1 unit  - 176-200: + 2 units  - 201-225: + 3 units  - 226-250: + 4 units  - 251-275: + 5 units   - 276-300: + 6 units We stopped Glipizide 5 mg.  Pt checks his sugars 1-3x a day and they are higher - no sugar log, but tells me sugars ~ same as before - reviewed sugars from last visit: - am: 130-180 >> 119-150's >> 116-203 >> 154-200, 256 >> 144-230 - before lunch: 165-198 >> 82-145 >> 135-193 >> 120-150 >> n/c >> 215-245 - before dinner: 159-182 >> 73-163 >> 107-180 >> 114-180 >> 171-205 >> n/c >> 140-178 - bedtime: 162-246 >> 112-254 - last 3 weeks >> n/c >> 244 No lows.  ? hypoglycemia awareness. Highest sugar was 300 x1.  Pt's meals are: - Breakfast: bacon, eggs, tomato sandwich (sometimes no bread) - Lunch: meat + 2 veggies (no potatoes) - Dinner: meat + 2 veggies - Snacks: 1 a day - 2x a week, icecream  Uses Splenda in tea and coffee.  - mild CKD, last BUN/creatinine:  Lab Results   Component Value Date   BUN 22 02/07/2014   CREATININE 1.26 02/07/2014  On Losartan. - last set of lipids: Lab Results  Component Value Date   CHOL 145 02/07/2014   HDL 27.40* 02/07/2014   LDLCALC 73 01/19/2013   LDLDIRECT 82.0 02/07/2014   TRIG 397.0* 02/07/2014   CHOLHDL 5 02/07/2014  Not on a statin as intolerant. - last eye exam was 08/2013. No DR. Dr Claudean Kinds. - + numbness and tingling in his feet. No sensation in his feet. H/o foot ulcers. Had appt with Dr [name] yesterday >> cleaned a callus on the sole of L foot >> now small ulcer there after the procedure.   He also has a history of Prostate cancer, s/p 25 RxTx, then radioactive seeds. Also, hyperlipidemia, hypertension, sleep apnea, osteoarthrosis. He had amputation of his big toe in 12/30/2012 2/2 infection >> osteomyelitis. He was hospitalized for foot ulcer (bottom of L foot) in 03/2013 >> boot. MRI: no Osteomyelitis, had gas gangrene and abscesses >> debrided and tx with ABx.   ROS: Constitutional: no weight loss/gain, no fatigue, no subjective hyperthermia/hypothermia Eyes: no blurry vision, no xerophthalmia ENT: no sore throat, no nodules palpated in throat, no dysphagia/odynophagia, no hoarseness Cardiovascular: no CP/SOB/palpitations/no leg swelling Respiratory: no cough/SOB Gastrointestinal: no N/V/D/no C  Musculoskeletal: no muscle/no joint aches - left lower leg in boot Skin: no rashes Neurological: no tremors/numbness/tingling/dizziness  I reviewed pt's medications, allergies, PMH, social hx, family hx, and changes were documented in the history of present illness. Otherwise, unchanged from my initial visit note.  PE: BP 122/72 mmHg  Pulse 91  Temp(Src) 97.9 F (36.6 C) (Oral)  Resp 12  Wt 311 lb (141.069 kg)  SpO2 96% Wt Readings from Last 3 Encounters:  02/15/14 311 lb (141.069 kg)  02/07/14 309 lb 8 oz (140.388 kg)  01/06/14 302 lb (136.986 kg)   Constitutional: obese, in NAD, ruddy  complexion Eyes: PERRLA, EOMI, no exophthalmos ENT: moist mucous membranes, no thyromegaly, no cervical lymphadenopathy; large neck Cardiovascular: RRR, No MRG, + bilateral leg edema, pitting. Boot on left leg.  Respiratory: CTA B Gastrointestinal: abdomen soft, NT, ND, BS+ Musculoskeletal: no deformities, strength intact in all 4 Skin: moist, warm, stasis dermatitis in bilateral legs  - left lower leg in boot See separate form with foot exam  ASSESSMENT: 1. DM2, insulin-dependent, uncontrolled, with complications - peripheral neuropathy - CAD-status post stent in 2005, then 5v CABG in 2008 - PVD - history of diabetic foot ulcer - Charcot foot - L - h/o amputation of his big toe in 12/30/2012 2/2 infection >> osteomyelitis. - history of retinal detachment in 2002-3  2. FOOt exam  PLAN:  1. Patient with several years of DM2, recently more uncontrolled, on basal -bolus insulin regimen + metformin.  -  I suggested to start checking sugars before all meals and bring log at next visit. Continue current regimen for now.  Patient Instructions  - Continue Metformin 1000 mg po 2x a day - Continue Toujeo 80 units at night - Increase Humalog at mealtime as follows:  25 units with a smaller meal 30 units with a larger meal or dinner - Continue Humalog sliding scale: - 150-175: + 1 unit  - 176-200: + 2 units  - 201-225: + 3 units  - 226-250: + 4 units  - 251-275: + 5 units - 276-300: + 6 units  Please return in 1.5 months.  - continue checking sugars at different times of the day - check 3 times a day, rotating checks - also check at bedtime - advised for yearly eye exams >> up to date - Return to clinic in 1.5 month with sugar log   2. Foot exam - >50% visit spent in performing a detailed foot exam and filling up his form - will scan. Time spent with pt: 30 min.

## 2014-02-15 NOTE — Patient Instructions (Signed)
Patient Instructions  - Continue Metformin 1000 mg po 2x a day - Continue Toujeo 80 units at night - Increase Humalog at mealtime as follows:  25 units with a smaller meal 30 units with a larger meal or dinner - Continue Humalog sliding scale: - 150-175: + 1 unit  - 176-200: + 2 units  - 201-225: + 3 units  - 226-250: + 4 units  - 251-275: + 5 units - 276-300: + 6 units  Please return in 1.5 months.

## 2014-02-21 ENCOUNTER — Other Ambulatory Visit: Payer: Self-pay | Admitting: Internal Medicine

## 2014-02-21 NOTE — Telephone Encounter (Signed)
Last office visit 02/07/2014.  Ok to refill?

## 2014-02-22 NOTE — Telephone Encounter (Signed)
You sent this refill, right?  Please don't route these back to me It really confuses me and I spend a long time trying to figure out why it came back!!

## 2014-02-22 NOTE — Telephone Encounter (Signed)
Approved: okay #[id number] x 3

## 2014-02-25 DIAGNOSIS — D225 Melanocytic nevi of trunk: Secondary | ICD-10-CM | POA: Diagnosis not present

## 2014-02-25 DIAGNOSIS — D485 Neoplasm of uncertain behavior of skin: Secondary | ICD-10-CM | POA: Diagnosis not present

## 2014-02-28 DIAGNOSIS — D225 Melanocytic nevi of trunk: Secondary | ICD-10-CM | POA: Diagnosis not present

## 2014-02-28 DIAGNOSIS — C44619 Basal cell carcinoma of skin of left upper limb, including shoulder: Secondary | ICD-10-CM | POA: Diagnosis not present

## 2014-03-12 ENCOUNTER — Other Ambulatory Visit: Payer: Self-pay | Admitting: Internal Medicine

## 2014-03-14 DIAGNOSIS — I83229 Varicose veins of left lower extremity with both ulcer of unspecified site and inflammation: Secondary | ICD-10-CM | POA: Diagnosis not present

## 2014-03-14 DIAGNOSIS — E1142 Type 2 diabetes mellitus with diabetic polyneuropathy: Secondary | ICD-10-CM | POA: Diagnosis not present

## 2014-03-14 DIAGNOSIS — L97421 Non-pressure chronic ulcer of left heel and midfoot limited to breakdown of skin: Secondary | ICD-10-CM | POA: Diagnosis not present

## 2014-03-30 DIAGNOSIS — N209 Urinary calculus, unspecified: Secondary | ICD-10-CM | POA: Diagnosis not present

## 2014-03-30 DIAGNOSIS — K760 Fatty (change of) liver, not elsewhere classified: Secondary | ICD-10-CM | POA: Diagnosis not present

## 2014-03-30 DIAGNOSIS — R109 Unspecified abdominal pain: Secondary | ICD-10-CM | POA: Diagnosis not present

## 2014-03-30 DIAGNOSIS — N201 Calculus of ureter: Secondary | ICD-10-CM | POA: Diagnosis not present

## 2014-03-30 DIAGNOSIS — C44619 Basal cell carcinoma of skin of left upper limb, including shoulder: Secondary | ICD-10-CM | POA: Diagnosis not present

## 2014-03-30 DIAGNOSIS — R112 Nausea with vomiting, unspecified: Secondary | ICD-10-CM | POA: Diagnosis not present

## 2014-03-30 DIAGNOSIS — N132 Hydronephrosis with renal and ureteral calculous obstruction: Secondary | ICD-10-CM | POA: Diagnosis not present

## 2014-03-30 DIAGNOSIS — C61 Malignant neoplasm of prostate: Secondary | ICD-10-CM | POA: Diagnosis not present

## 2014-03-31 DIAGNOSIS — L905 Scar conditions and fibrosis of skin: Secondary | ICD-10-CM | POA: Diagnosis not present

## 2014-04-06 DIAGNOSIS — L97421 Non-pressure chronic ulcer of left heel and midfoot limited to breakdown of skin: Secondary | ICD-10-CM | POA: Diagnosis not present

## 2014-04-12 ENCOUNTER — Ambulatory Visit: Payer: No Typology Code available for payment source | Admitting: Internal Medicine

## 2014-04-18 DIAGNOSIS — E1142 Type 2 diabetes mellitus with diabetic polyneuropathy: Secondary | ICD-10-CM | POA: Diagnosis not present

## 2014-04-18 DIAGNOSIS — I83229 Varicose veins of left lower extremity with both ulcer of unspecified site and inflammation: Secondary | ICD-10-CM | POA: Diagnosis not present

## 2014-04-18 DIAGNOSIS — L97421 Non-pressure chronic ulcer of left heel and midfoot limited to breakdown of skin: Secondary | ICD-10-CM | POA: Diagnosis not present

## 2014-05-03 DIAGNOSIS — N201 Calculus of ureter: Secondary | ICD-10-CM | POA: Diagnosis not present

## 2014-05-03 DIAGNOSIS — N209 Urinary calculus, unspecified: Secondary | ICD-10-CM | POA: Diagnosis not present

## 2014-05-09 ENCOUNTER — Ambulatory Visit (INDEPENDENT_AMBULATORY_CARE_PROVIDER_SITE_OTHER): Payer: Medicare Other | Admitting: Internal Medicine

## 2014-05-09 ENCOUNTER — Encounter: Payer: Self-pay | Admitting: Internal Medicine

## 2014-05-09 VITALS — BP 128/80 | HR 97 | Temp 98.1°F | Resp 16 | Wt 310.0 lb

## 2014-05-09 DIAGNOSIS — E1140 Type 2 diabetes mellitus with diabetic neuropathy, unspecified: Secondary | ICD-10-CM

## 2014-05-09 LAB — HEMOGLOBIN A1C: HEMOGLOBIN A1C: 7.1 % — AB (ref 4.6–6.5)

## 2014-05-09 NOTE — Patient Instructions (Signed)
-   Continue Metformin 1000 mg po 2x a day - Continue Toujeo 80 units at night - Continue Humalog at mealtime as follows:  25 units with a smaller meal 30 units with a larger meal or dinner - Continue Humalog sliding scale: - 150-175: + 1 unit  - 176-200: + 2 units  - 201-225: + 3 units  - 226-250: + 4 units  - 251-275: + 5 units - 276-300: + 6 units  Please return in 3 months.  Check sugars before lunch and dinner.  Please stop at the lab.

## 2014-05-09 NOTE — Progress Notes (Signed)
Patient ID: [name], male   DOB: 06-Jan-1951, 64 y.o.   MRN: [id number]  HPI: [name] is a 64 y.o.-year-old male, returning for f/u for DM2, dx 2008, insulin-dependent since dx, uncontrolled, with complications (peripheral neuropathy, CAD-status post CABG in 2008, PVD, Charcot foot, history of diabetic foot ulcer, history of retinal detachment in 2002-3). Last visit 3 mo ago.  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   He had kidney stones since last visit.   Last hemoglobin A1c was: Lab Results  Component Value Date   HGBA1C 7.7* 01/06/2014   HGBA1C 7.8* 10/06/2013   HGBA1C 6.6* 07/06/2013   Pt is on a regimen of: - Metformin 1000 mg po bid - Toujeo 80 units at night - Humalog at mealtime as follows:  25 units with a smaller meal 30 units with a larger meal or dinner - Humalog sliding scale: - 150-175: + 1 unit  - 176-200: + 2 units  - 201-225: + 3 units  - 226-250: + 4 units  - 251-275: + 5 units   - 276-300: + 6 units We stopped Glipizide 5 mg.  Pt checks his sugars 1-3x a day and they are higher - reviewed sugar log: - am: 130-180 >> 119-150's >> 116-203 >> 154-200, 256 >> 144-230 >> 116-221, 309 - before lunch: 165-198 >> 82-145 >> 135-193 >> 120-150 >> n/c >> 215-245 >> n/c - before dinner: 159-182 >> 73-163 >> 107-180 >> 114-180 >> 171-205 >> n/c >> 140-178 >> 129-179 - bedtime: 162-246 >> 112-254 - last 3 weeks >> n/c >> 244 >> n/c No lows.  ? hypoglycemia awareness. Highest sugar was 309  Pt's meals are: - Breakfast: bacon, eggs, tomato sandwich (sometimes no bread) - Lunch: meat + 2 veggies (no potatoes) - Dinner: meat + 2 veggies - Snacks: 1 a day - 2x a week, icecream  Uses Splenda in tea and coffee.  - mild CKD, last BUN/creatinine:  Lab Results  Component Value Date   BUN 22 02/07/2014   CREATININE 1.26 02/07/2014  On Losartan. - last set of lipids: Lab Results  Component Value Date   CHOL 145  02/07/2014   HDL 27.40* 02/07/2014   LDLCALC 73 01/19/2013   LDLDIRECT 82.0 02/07/2014   TRIG 397.0* 02/07/2014   CHOLHDL 5 02/07/2014  Not on a statin as intolerant. - last eye exam was 08/2013. No DR. Dr Claudean Kinds. - + numbness and tingling in his feet. No sensation in his feet. H/o foot ulcers. Had appt with Dr [name] yesterday >> cleaned a callus on the sole of L foot >> now small ulcer there after the procedure.   He also has a history of Prostate cancer, s/p 25 RxTx, then radioactive seeds. Also, hyperlipidemia, hypertension, sleep apnea, osteoarthrosis. He had amputation of his big toe in 12/30/2012 2/2 infection >> osteomyelitis. He was hospitalized for foot ulcer (bottom of L foot) in 03/2013 >> boot. MRI: no Osteomyelitis, had gas gangrene and abscesses >> debrided and tx with ABx.  He had 3 foot ulcers >> osteomyelitis >> had L partial bone excision 11/03/2013 - Dr [name]. He has a h/o Charcot foot.   ROS: Constitutional: no weight loss/gain, no fatigue, no subjective hyperthermia/hypothermia Eyes: no blurry vision, no xerophthalmia ENT: no sore throat, no nodules palpated in throat, no dysphagia/odynophagia, no hoarseness Cardiovascular: no CP/SOB/palpitations/no leg swelling Respiratory: no cough/SOB Gastrointestinal: no N/V/D/no C Musculoskeletal: no muscle/no joint aches Skin: no rashes  Neurological: no tremors/numbness/tingling/dizziness  I reviewed pt's medications, allergies, PMH, social hx, family hx, and changes were documented in the history of present illness. Otherwise, unchanged from my initial visit note.  PE: BP 128/80 mmHg  Pulse 97  Temp(Src) 98.1 F (36.7 C) (Oral)  Resp 16  Wt 310 lb (140.615 kg)  SpO2 95% Body mass index is 39.78 kg/(m^2).  Wt Readings from Last 3 Encounters:  05/09/14 310 lb (140.615 kg)  02/15/14 311 lb (141.069 kg)  02/07/14 309 lb 8 oz (140.388 kg)   Constitutional: obese, in NAD, ruddy complexion Eyes: PERRLA, EOMI, no  exophthalmos ENT: moist mucous membranes, no thyromegaly, no cervical lymphadenopathy; large neck Cardiovascular: RRR, No MRG, + bilateral leg edema, pitting. Wears compression socks. Respiratory: CTA B Gastrointestinal: abdomen soft, NT, ND, BS+ Musculoskeletal: no deformities, strength intact in all 4 Skin: moist, warm, stasis dermatitis in bilateral legs  See separate form with foot exam  ASSESSMENT: 1. DM2, insulin-dependent, uncontrolled, with complications - peripheral neuropathy - CAD-status post stent in 2005, then 5v CABG in 2008 - PVD - history of diabetic foot ulcer - Charcot foot - L - h/o amputation of his big toe in 12/30/2012 2/2 infection >> osteomyelitis. - history of retinal detachment in 2002-3  PLAN:  1. Patient with several years of DM2, recently more uncontrolled, on basal -bolus insulin regimen + metformin. Sugars ~ same as before. He did not check sugars before lunch and bedtime, so it is difficult to adjust the insulin doses. I would like to change Humalog to U200 but he has a large supply at home for now. I suggested to start checking sugars before all meals and bedtime and bring log at next visit. Continue current regimen for now. Patient Instructions  - Continue Metformin 1000 mg po 2x a day - Continue Toujeo 80 units at night - Continue Humalog at mealtime as follows:  25 units with a smaller meal 30 units with a larger meal or dinner - Continue Humalog sliding scale: - 150-175: + 1 unit  - 176-200: + 2 units  - 201-225: + 3 units  - 226-250: + 4 units  - 251-275: + 5 units - 276-300: + 6 units  Please return in 3 months.  Check sugars before lunch and dinner.  Please stop at the lab.  - continue checking sugars at different times of the day - check 3 times a day, rotating checks - also check at bedtime - advised him to start being more active as he is now out of his boot and also to start cutting down his portions to be able to lose some  weight - advised for yearly eye exams >> up to date - check Hba1c today - Return to clinic in 3 month with sugar log   Office Visit on 05/09/2014  Component Date Value Ref Range Status  . Hgb A1c MFr Bld 05/09/2014 7.1* 4.6 - 6.5 % Final   Glycemic Control Guidelines for People with Diabetes:Non Diabetic:  <6%Goal of Therapy: <7%Additional Action Suggested:  >8%     Hemoglobin A1c is better!

## 2014-05-16 DIAGNOSIS — I83229 Varicose veins of left lower extremity with both ulcer of unspecified site and inflammation: Secondary | ICD-10-CM | POA: Diagnosis not present

## 2014-05-16 DIAGNOSIS — E1142 Type 2 diabetes mellitus with diabetic polyneuropathy: Secondary | ICD-10-CM | POA: Diagnosis not present

## 2014-05-16 DIAGNOSIS — L97421 Non-pressure chronic ulcer of left heel and midfoot limited to breakdown of skin: Secondary | ICD-10-CM | POA: Diagnosis not present

## 2014-05-30 DIAGNOSIS — E1142 Type 2 diabetes mellitus with diabetic polyneuropathy: Secondary | ICD-10-CM | POA: Diagnosis not present

## 2014-05-30 DIAGNOSIS — L97421 Non-pressure chronic ulcer of left heel and midfoot limited to breakdown of skin: Secondary | ICD-10-CM | POA: Diagnosis not present

## 2014-05-30 DIAGNOSIS — I83229 Varicose veins of left lower extremity with both ulcer of unspecified site and inflammation: Secondary | ICD-10-CM | POA: Diagnosis not present

## 2014-06-23 ENCOUNTER — Other Ambulatory Visit: Payer: Self-pay | Admitting: Internal Medicine

## 2014-06-30 DIAGNOSIS — E1142 Type 2 diabetes mellitus with diabetic polyneuropathy: Secondary | ICD-10-CM | POA: Diagnosis not present

## 2014-06-30 DIAGNOSIS — L97421 Non-pressure chronic ulcer of left heel and midfoot limited to breakdown of skin: Secondary | ICD-10-CM | POA: Diagnosis not present

## 2014-06-30 DIAGNOSIS — I83229 Varicose veins of left lower extremity with both ulcer of unspecified site and inflammation: Secondary | ICD-10-CM | POA: Diagnosis not present

## 2014-07-12 DIAGNOSIS — C61 Malignant neoplasm of prostate: Secondary | ICD-10-CM | POA: Diagnosis not present

## 2014-07-18 DIAGNOSIS — E1142 Type 2 diabetes mellitus with diabetic polyneuropathy: Secondary | ICD-10-CM | POA: Diagnosis not present

## 2014-07-18 DIAGNOSIS — L97421 Non-pressure chronic ulcer of left heel and midfoot limited to breakdown of skin: Secondary | ICD-10-CM | POA: Diagnosis not present

## 2014-07-19 DIAGNOSIS — C61 Malignant neoplasm of prostate: Secondary | ICD-10-CM | POA: Diagnosis not present

## 2014-07-19 DIAGNOSIS — R312 Other microscopic hematuria: Secondary | ICD-10-CM | POA: Diagnosis not present

## 2014-07-19 DIAGNOSIS — N209 Urinary calculus, unspecified: Secondary | ICD-10-CM | POA: Diagnosis not present

## 2014-07-28 DIAGNOSIS — E1142 Type 2 diabetes mellitus with diabetic polyneuropathy: Secondary | ICD-10-CM | POA: Diagnosis not present

## 2014-07-28 DIAGNOSIS — L97421 Non-pressure chronic ulcer of left heel and midfoot limited to breakdown of skin: Secondary | ICD-10-CM | POA: Diagnosis not present

## 2014-07-28 DIAGNOSIS — I83229 Varicose veins of left lower extremity with both ulcer of unspecified site and inflammation: Secondary | ICD-10-CM | POA: Diagnosis not present

## 2014-08-08 ENCOUNTER — Encounter: Payer: Self-pay | Admitting: Internal Medicine

## 2014-08-08 ENCOUNTER — Ambulatory Visit (INDEPENDENT_AMBULATORY_CARE_PROVIDER_SITE_OTHER): Payer: Medicare Other | Admitting: Internal Medicine

## 2014-08-08 VITALS — BP 142/80 | HR 93 | Temp 98.1°F | Resp 12 | Wt 311.0 lb

## 2014-08-08 DIAGNOSIS — E1140 Type 2 diabetes mellitus with diabetic neuropathy, unspecified: Secondary | ICD-10-CM

## 2014-08-08 DIAGNOSIS — I2510 Atherosclerotic heart disease of native coronary artery without angina pectoris: Secondary | ICD-10-CM | POA: Diagnosis not present

## 2014-08-08 MED ORDER — INSULIN LISPRO 100 UNIT/ML (KWIKPEN): PEN_INJECTOR | SUBCUTANEOUS | Status: DC

## 2014-08-08 NOTE — Patient Instructions (Signed)
-   Continue Metformin 1000 mg po 2x a day - Continue Toujeo 80 units at night - Continue Humalog at mealtime as follows:  30 units with a smaller meal 35 units with a larger meal or dinner - Continue Humalog sliding scale: - 150-175: + 1 unit  - 176-200: + 2 units  - 201-225: + 3 units  - 226-250: + 4 units  - 251-275: + 5 units - 276-300: + 6 units  Please add 8-10 units before a snack after dinner.  Please return in 3 months.

## 2014-08-08 NOTE — Progress Notes (Signed)
Patient ID: [name], male   DOB: [birth year], 64 y.o.   MRN: [id number]  HPI: [name] is a 64 y.o.-year-old male, returning for f/u for DM2, dx 2008, insulin-dependent since dx, uncontrolled, with complications (peripheral neuropathy, CAD-status post CABG in 2008, PVD, Charcot foot, history of diabetic foot ulcer, history of retinal detachment in 2002-3). Last visit 3 mo ago. 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   Last hemoglobin A1c was: Lab Results  Component Value Date   HGBA1C 7.1* 05/09/2014   HGBA1C 7.7* 01/06/2014   HGBA1C 7.8* 10/06/2013   Pt is on a regimen of: - Metformin 1000 mg po bid - Toujeo 80 units at night - Humalog at mealtime as follows:  30 units with a smaller meal 35 units with a larger meal or dinner - Humalog sliding scale - not using: - 150-175: + 1 unit  - 176-200: + 2 units  - 201-225: + 3 units  - 226-250: + 4 units  - 251-275: + 5 units   - 276-300: + 6 units We stopped Glipizide 5 mg.  Pt checks his sugars 1-3x a day and they are higher - reviewed sugar log >> sugars higher: - am: 119-150's >> 116-203 >> 154-200, 256 >> 144-230 >> 116-221, 309 >> 173-255 - before lunch: 165-198 >> 82-145 >> 135-193 >> 120-150 >> n/c >> 215-245 >> n/c >> 134, 170 - before dinner: 107-180 >> 114-180 >> 171-205 >> n/c >> 140-178 >> 129-179 >> 92, 108-164, 193 - bedtime: 162-246 >> 112-254 - last 3 weeks >> n/c >> 244 >> n/c >> 194-310 No lows. Lowest: 92 before dinner ? hypoglycemia awareness. Highest sugar was 310.  Pt's meals are: - Breakfast: bacon, eggs, tomato sandwich (sometimes no bread) - Lunch: meat + 2 veggies (no potatoes) - Dinner: meat + 2 veggies - Snacks: 1 a day - 2x a week, icecream  Uses Splenda in tea and coffee.  - + mild CKD, last BUN/creatinine:  Lab Results  Component Value Date   BUN 22 02/07/2014   CREATININE 1.26 02/07/2014  On Losartan. - last set of lipids: Lab Results   Component Value Date   CHOL 145 02/07/2014   HDL 27.40* 02/07/2014   LDLCALC 73 01/19/2013   LDLDIRECT 82.0 02/07/2014   TRIG 397.0* 02/07/2014   CHOLHDL 5 02/07/2014  Not on a statin as intolerant. - last eye exam was 08/2013. No DR. Dr Claudean Kinds. Sees eye dr again 08/09/2014. - + numbness and tingling in his feet. No sensation in his feet.  He had amputation of his big toe in 12/30/2012 2/2 infection >> osteomyelitis. He was hospitalized for foot ulcer (bottom of L foot) in 03/2013 >> boot. MRI: no Osteomyelitis, had gas gangrene and abscesses >> debrided and tx with ABx.  He had 3 foot ulcers >> osteomyelitis >> had L partial bone excision 11/03/2013 - Dr [name]. Xray 07/2014. He has a h/o Charcot foot.    He also has a history of Prostate cancer, s/p 25 RxTx, then radioactive seeds. Also, hyperlipidemia, hypertension, sleep apnea, osteoarthrosis.   ROS: Constitutional: no weight loss/gain, no fatigue, no subjective hyperthermia/hypothermia Eyes: no blurry vision, no xerophthalmia ENT: no sore throat, no nodules palpated in throat, no dysphagia/odynophagia, no hoarseness Cardiovascular: no CP/SOB/palpitations/no leg swelling Respiratory: no cough/SOB Gastrointestinal: no N/V/D/no C Musculoskeletal: no muscle/no joint aches Skin: no rashes, + ulcer L foot sole  Neurological: no tremors/numbness/tingling/dizziness  I reviewed pt's  medications, allergies, PMH, social hx, family hx, and changes were documented in the history of present illness. Otherwise, unchanged from my initial visit note.  PE: BP 142/80 mmHg  Pulse 93  Temp(Src) 98.1 F (36.7 C) (Oral)  Resp 12  Wt 311 lb (141.069 kg)  SpO2 96% Body mass index is 39.91 kg/(m^2).  Wt Readings from Last 3 Encounters:  08/08/14 311 lb (141.069 kg)  05/09/14 310 lb (140.615 kg)  02/15/14 311 lb (141.069 kg)   Constitutional: obese, in NAD, ruddy complexion Eyes: PERRLA, EOMI, no exophthalmos ENT: moist mucous membranes, no  thyromegaly, no cervical lymphadenopathy; large neck Cardiovascular: RRR, No MRG, + bilateral leg edema, pitting. Respiratory: CTA B Gastrointestinal: abdomen soft, NT, ND, BS+ Musculoskeletal: no deformities, strength intact in all 4 Skin: moist, warm, stasis dermatitis in bilateral legs, 1 cm ulcer L foot sole, granulation tissue, callus around it  ASSESSMENT: 1. DM2, insulin-dependent, uncontrolled, with complications - peripheral neuropathy - CAD-status post stent in 2005, then 5v CABG in 2008 - PVD - history of diabetic foot ulcer - Charcot foot - L - h/o amputation of his big toe in 12/30/2012 2/2 infection >> osteomyelitis. - history of retinal detachment in 2002-3  PLAN:  1. Patient with several years of DM2, recently more uncontrolled b/c late snack at night, on basal -bolus insulin regimen + metformin. Sugars are a little higher than at last visit. I would like to change Humalog to U200 but he still has a large supply at home for now. Will need U500 insulin soon.  - For now, will add a Humalog bolus with snack after dinner:  Patient Instructions  - Continue Metformin 1000 mg po 2x a day - Continue Toujeo 80 units at night - Continue Humalog at mealtime as follows:  30 units with a smaller meal 35 units with a larger meal or dinner - Continue Humalog sliding scale: - 150-175: + 1 unit  - 176-200: + 2 units  - 201-225: + 3 units  - 226-250: + 4 units  - 251-275: + 5 units - 276-300: + 6 units  Please add 8-10 units before a snack after dinner.  Please return in 3 months.  - continue checking sugars at different times of the day - check 3 times a day, rotating checks - also check at bedtime - advised for yearly eye exams >> up to date - check Hba1c today >> 7.5% (increased) - Return to clinic in 3 month with sugar log

## 2014-08-09 ENCOUNTER — Ambulatory Visit: Payer: Medicare Other | Admitting: Internal Medicine

## 2014-08-09 DIAGNOSIS — E119 Type 2 diabetes mellitus without complications: Secondary | ICD-10-CM | POA: Diagnosis not present

## 2014-08-09 DIAGNOSIS — H25013 Cortical age-related cataract, bilateral: Secondary | ICD-10-CM | POA: Diagnosis not present

## 2014-08-09 DIAGNOSIS — H2513 Age-related nuclear cataract, bilateral: Secondary | ICD-10-CM | POA: Diagnosis not present

## 2014-08-09 LAB — HM DIABETES EYE EXAM

## 2014-08-09 LAB — POCT GLYCOSYLATED HEMOGLOBIN (HGB A1C): Hemoglobin A1C: 7.5

## 2014-08-09 NOTE — Addendum Note (Signed)
Addended by: Richardine Service on: 08/09/2014 08:55 AM   Modules accepted: Orders

## 2014-08-10 ENCOUNTER — Encounter: Payer: Self-pay | Admitting: Internal Medicine

## 2014-08-18 DIAGNOSIS — L97421 Non-pressure chronic ulcer of left heel and midfoot limited to breakdown of skin: Secondary | ICD-10-CM | POA: Diagnosis not present

## 2014-09-01 DIAGNOSIS — R312 Other microscopic hematuria: Secondary | ICD-10-CM | POA: Diagnosis not present

## 2014-09-08 DIAGNOSIS — L97421 Non-pressure chronic ulcer of left heel and midfoot limited to breakdown of skin: Secondary | ICD-10-CM | POA: Diagnosis not present

## 2014-09-08 DIAGNOSIS — I83229 Varicose veins of left lower extremity with both ulcer of unspecified site and inflammation: Secondary | ICD-10-CM | POA: Diagnosis not present

## 2014-09-08 DIAGNOSIS — E1142 Type 2 diabetes mellitus with diabetic polyneuropathy: Secondary | ICD-10-CM | POA: Diagnosis not present

## 2014-09-13 DIAGNOSIS — K8020 Calculus of gallbladder without cholecystitis without obstruction: Secondary | ICD-10-CM | POA: Diagnosis not present

## 2014-09-13 DIAGNOSIS — R312 Other microscopic hematuria: Secondary | ICD-10-CM | POA: Diagnosis not present

## 2014-09-13 DIAGNOSIS — K7689 Other specified diseases of liver: Secondary | ICD-10-CM | POA: Diagnosis not present

## 2014-09-13 DIAGNOSIS — K5730 Diverticulosis of large intestine without perforation or abscess without bleeding: Secondary | ICD-10-CM | POA: Diagnosis not present

## 2014-09-27 DIAGNOSIS — R312 Other microscopic hematuria: Secondary | ICD-10-CM | POA: Diagnosis not present

## 2014-10-06 DIAGNOSIS — L97421 Non-pressure chronic ulcer of left heel and midfoot limited to breakdown of skin: Secondary | ICD-10-CM | POA: Diagnosis not present

## 2014-10-14 ENCOUNTER — Other Ambulatory Visit: Payer: Self-pay | Admitting: Internal Medicine

## 2014-10-20 DIAGNOSIS — L97421 Non-pressure chronic ulcer of left heel and midfoot limited to breakdown of skin: Secondary | ICD-10-CM | POA: Diagnosis not present

## 2014-11-04 ENCOUNTER — Telehealth: Payer: Self-pay | Admitting: Internal Medicine

## 2014-11-08 ENCOUNTER — Ambulatory Visit: Payer: Medicare Other | Admitting: Internal Medicine

## 2014-11-10 DIAGNOSIS — I87333 Chronic venous hypertension (idiopathic) with ulcer and inflammation of bilateral lower extremity: Secondary | ICD-10-CM | POA: Diagnosis not present

## 2014-11-28 DIAGNOSIS — L97421 Non-pressure chronic ulcer of left heel and midfoot limited to breakdown of skin: Secondary | ICD-10-CM | POA: Diagnosis not present

## 2014-11-28 DIAGNOSIS — E1142 Type 2 diabetes mellitus with diabetic polyneuropathy: Secondary | ICD-10-CM | POA: Diagnosis not present

## 2014-12-15 ENCOUNTER — Other Ambulatory Visit: Payer: Self-pay | Admitting: Internal Medicine

## 2014-12-15 ENCOUNTER — Encounter: Payer: Self-pay | Admitting: Internal Medicine

## 2014-12-19 DIAGNOSIS — L97421 Non-pressure chronic ulcer of left heel and midfoot limited to breakdown of skin: Secondary | ICD-10-CM | POA: Diagnosis not present

## 2014-12-19 DIAGNOSIS — I87333 Chronic venous hypertension (idiopathic) with ulcer and inflammation of bilateral lower extremity: Secondary | ICD-10-CM | POA: Diagnosis not present

## 2014-12-19 DIAGNOSIS — I83229 Varicose veins of left lower extremity with both ulcer of unspecified site and inflammation: Secondary | ICD-10-CM | POA: Diagnosis not present

## 2014-12-19 DIAGNOSIS — E1142 Type 2 diabetes mellitus with diabetic polyneuropathy: Secondary | ICD-10-CM | POA: Diagnosis not present

## 2014-12-20 ENCOUNTER — Other Ambulatory Visit: Payer: Self-pay | Admitting: Internal Medicine

## 2014-12-27 ENCOUNTER — Other Ambulatory Visit: Payer: Self-pay | Admitting: Internal Medicine

## 2014-12-29 ENCOUNTER — Ambulatory Visit (INDEPENDENT_AMBULATORY_CARE_PROVIDER_SITE_OTHER): Payer: Medicare Other | Admitting: Internal Medicine

## 2014-12-29 ENCOUNTER — Encounter: Payer: Self-pay | Admitting: Internal Medicine

## 2014-12-29 ENCOUNTER — Other Ambulatory Visit (INDEPENDENT_AMBULATORY_CARE_PROVIDER_SITE_OTHER): Payer: Medicare Other | Admitting: *Deleted

## 2014-12-29 VITALS — BP 124/72 | HR 88 | Temp 97.4°F | Resp 12 | Wt 312.0 lb

## 2014-12-29 DIAGNOSIS — E1149 Type 2 diabetes mellitus with other diabetic neurological complication: Secondary | ICD-10-CM

## 2014-12-29 DIAGNOSIS — I2510 Atherosclerotic heart disease of native coronary artery without angina pectoris: Secondary | ICD-10-CM

## 2014-12-29 DIAGNOSIS — E1142 Type 2 diabetes mellitus with diabetic polyneuropathy: Secondary | ICD-10-CM

## 2014-12-29 DIAGNOSIS — Z23 Encounter for immunization: Secondary | ICD-10-CM

## 2014-12-29 DIAGNOSIS — Z794 Long term (current) use of insulin: Secondary | ICD-10-CM | POA: Diagnosis not present

## 2014-12-29 LAB — POCT GLYCOSYLATED HEMOGLOBIN (HGB A1C): Hemoglobin A1C: 7.9

## 2014-12-29 NOTE — Patient Instructions (Signed)
Please change: - Toujeo to 90 units (45 units x2 injections) and can increase to 100 units (50 units x2 injections) in 4-5 days if sugars in am not <150  Continue; - Metformin 1000 mg 2x a day - Humalog at mealtime as follows:   30 units with a smaller meal  35 units with a larger meal or dinner  10 units with snack after dinner  Please return in 3 months with your sugar log.

## 2014-12-29 NOTE — Progress Notes (Signed)
Patient ID: [name], male   DOB: [birth year], 64 y.o.   MRN: [id number]  HPI: [name] is a 64 y.o.-year-old male, returning for f/u for DM2, dx 2008, insulin-dependent since dx, uncontrolled, with complications (peripheral neuropathy, CAD-status post CABG in 2008, PVD, Charcot foot, history of diabetic foot ulcer, history of retinal detachment in 2002-3). Last visit 4.5 mo ago. 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   Last hemoglobin A1c was: Lab Results  Component Value Date   HGBA1C 7.5 08/09/2014   HGBA1C 7.1* 05/09/2014   HGBA1C 7.7* 01/06/2014   Pt is on a regimen of: - Metformin 1000 mg po bid - Toujeo 80 units at night - Humalog at mealtime as follows:  30 units with a smaller meal 35 units with a larger meal or dinner 10 units with snack after dinner We stopped Glipizide 5 mg.  Pt checks his sugars 1-3x a day and they are higher - reviewed sugar log: - am: 119-150's >> 116-203 >> 154-200, 256 >> 144-230 >> 116-221, 309 >> 173-255 >> 132, 187-220 - before lunch: 165-198 >> 82-145 >> 135-193 >> 120-150 >> n/c >> 215-245 >> n/c >> 134, 170 >> 187 - before dinner: 107-180 >> 114-180 >> 171-205 >> n/c >> 140-178 >> 129-179 >> 92, 108-164, 193 >> 150-211, 227 - bedtime: 162-246 >> 112-254 - last 3 weeks >> n/c >> 244 >> n/c >> 194-310 >> 117, 175-236 No lows. Lowest: 92 before dinner >> 117. ? hypoglycemia awareness. Highest sugar was 310 >> 260.  Pt's meals are: - Breakfast: bacon, eggs, tomato sandwich (sometimes no bread) - Lunch: meat + 2 veggies (no potatoes) - Dinner: meat + 2 veggies - Snacks: 1 a day - 2x a week, icecream  Uses Splenda in tea and coffee.  - + mild CKD, last BUN/creatinine:  Lab Results  Component Value Date   BUN 22 02/07/2014   CREATININE 1.26 02/07/2014  On Losartan. - last set of lipids: Lab Results  Component Value Date   CHOL 145 02/07/2014   HDL 27.40* 02/07/2014   LDLCALC 73 01/19/2013    LDLDIRECT 82.0 02/07/2014   TRIG 397.0* 02/07/2014   CHOLHDL 5 02/07/2014  Not on a statin as intolerant. - last eye exam was 08/09/2014. No DR. Dr Claudean Kinds.  - + numbness and tingling in his feet. No sensation in his feet.  He had amputation of his big toe in 12/30/2012 2/2 infection >> osteomyelitis. He was hospitalized for foot ulcer (bottom of L foot) in 03/2013 >> boot. MRI: no Osteomyelitis, had gas gangrene and abscesses >> debrided and tx with ABx.  He had 3 foot ulcers >> osteomyelitis >> had L partial bone excision 11/03/2013 - Dr [name]. Xray 07/2014. He has a h/o Charcot foot.    He also has a history of Prostate cancer, s/p 25 RxTx, then radioactive seeds. Also, hyperlipidemia, hypertension, sleep apnea, osteoarthrosis.   ROS: Constitutional: no weight loss/gain, no fatigue, no subjective hyperthermia/hypothermia Eyes: no blurry vision, no xerophthalmia ENT: no sore throat, no nodules palpated in throat, no dysphagia/odynophagia, no hoarseness Cardiovascular: no CP/SOB/palpitations/no leg swelling Respiratory: no cough/SOB Gastrointestinal: no N/V/D/no C Musculoskeletal: no muscle/no joint aches Skin: no rashes, + ulcer L foot sole  Neurological: no tremors/numbness/tingling/dizziness  I reviewed pt's medications, allergies, PMH, social hx, family hx, and changes were documented in the history of present illness. Otherwise, unchanged from my initial visit note.  PE: BP 124/72 mmHg  Pulse 88  Temp(Src) 97.4 F (36.3 C) (Oral)  Resp 12  Wt 312 lb (141.522 kg)  SpO2 97% Body mass index is 40.04 kg/(m^2).  Wt Readings from Last 3 Encounters:  12/29/14 312 lb (141.522 kg)  08/08/14 311 lb (141.069 kg)  05/09/14 310 lb (140.615 kg)   Constitutional: obese, in NAD, ruddy complexion Eyes: PERRLA, EOMI, no exophthalmos ENT: moist mucous membranes, no thyromegaly, no cervical lymphadenopathy; large neck Cardiovascular: RRR, No MRG, + bilateral leg edema,  pitting. Respiratory: CTA B Gastrointestinal: abdomen soft, NT, ND, BS+ Musculoskeletal: no deformities, strength intact in all 4 Skin: moist, warm, stasis dermatitis in bilateral legs, 1 cm ulcer L foot sole, granulation tissue, callus around it  ASSESSMENT: 1. DM2, insulin-dependent, uncontrolled, with complications - peripheral neuropathy - CAD-status post stent in 2005, then 5v CABG in 2008 - PVD - history of diabetic foot ulcer - Charcot foot - L - h/o amputation of his big toe in 12/30/2012 2/2 infection >> osteomyelitis. - history of retinal detachment in 2002-3  PLAN:  1. Patient uncontrolled DM2 b/c late snack at night, on basal -bolus insulin regimen + metformin. Sugars are higher than at last visit. Will increase Toujeo and split the injected amounts. Strongly advised him to pay attention to his diet. Will need U500 insulin soon.  - I advised him to:  Patient Instructions  Please change: - Toujeo to 90 units (45 units x2 injections) and can increase to 100 units (50 units x2 injections) in 4-5 days if sugars in am not <150  Continue; - Metformin 1000 mg 2x a day - Humalog at mealtime as follows:   30 units with a smaller meal  35 units with a larger meal or dinner  10 units with snack after dinner  Please return in 3 months with your sugar log.   - continue checking sugars at different times of the day - check 3 times a day, rotating checks - also check at bedtime - advised for yearly eye exams >> up to date - will get a flu shot today - check Hba1c today >> 7.9% (increased) - Return to clinic in 3 month with sugar log

## 2015-01-08 HISTORY — PX: CATARACT EXTRACTION: SUR2

## 2015-01-11 DIAGNOSIS — I83229 Varicose veins of left lower extremity with both ulcer of unspecified site and inflammation: Secondary | ICD-10-CM | POA: Diagnosis not present

## 2015-01-11 DIAGNOSIS — L97421 Non-pressure chronic ulcer of left heel and midfoot limited to breakdown of skin: Secondary | ICD-10-CM | POA: Diagnosis not present

## 2015-01-11 DIAGNOSIS — E1142 Type 2 diabetes mellitus with diabetic polyneuropathy: Secondary | ICD-10-CM | POA: Diagnosis not present

## 2015-01-11 DIAGNOSIS — I87333 Chronic venous hypertension (idiopathic) with ulcer and inflammation of bilateral lower extremity: Secondary | ICD-10-CM | POA: Diagnosis not present

## 2015-02-07 DIAGNOSIS — I87333 Chronic venous hypertension (idiopathic) with ulcer and inflammation of bilateral lower extremity: Secondary | ICD-10-CM | POA: Diagnosis not present

## 2015-02-07 DIAGNOSIS — L97421 Non-pressure chronic ulcer of left heel and midfoot limited to breakdown of skin: Secondary | ICD-10-CM | POA: Diagnosis not present

## 2015-02-07 DIAGNOSIS — E1142 Type 2 diabetes mellitus with diabetic polyneuropathy: Secondary | ICD-10-CM | POA: Diagnosis not present

## 2015-02-07 DIAGNOSIS — I83229 Varicose veins of left lower extremity with both ulcer of unspecified site and inflammation: Secondary | ICD-10-CM | POA: Diagnosis not present

## 2015-02-10 ENCOUNTER — Other Ambulatory Visit: Payer: Self-pay | Admitting: Internal Medicine

## 2015-02-13 ENCOUNTER — Ambulatory Visit (INDEPENDENT_AMBULATORY_CARE_PROVIDER_SITE_OTHER): Payer: Medicare Other | Admitting: Internal Medicine

## 2015-02-13 ENCOUNTER — Encounter: Payer: Self-pay | Admitting: Internal Medicine

## 2015-02-13 VITALS — BP 138/80 | HR 90 | Temp 97.4°F | Ht 74.0 in | Wt 313.0 lb

## 2015-02-13 DIAGNOSIS — Z0000 Encounter for general adult medical examination without abnormal findings: Secondary | ICD-10-CM | POA: Diagnosis not present

## 2015-02-13 DIAGNOSIS — Z6841 Body Mass Index (BMI) 40.0 and over, adult: Secondary | ICD-10-CM

## 2015-02-13 DIAGNOSIS — E785 Hyperlipidemia, unspecified: Secondary | ICD-10-CM | POA: Diagnosis not present

## 2015-02-13 DIAGNOSIS — E1140 Type 2 diabetes mellitus with diabetic neuropathy, unspecified: Secondary | ICD-10-CM | POA: Diagnosis not present

## 2015-02-13 DIAGNOSIS — E11610 Type 2 diabetes mellitus with diabetic neuropathic arthropathy: Secondary | ICD-10-CM

## 2015-02-13 DIAGNOSIS — Z7189 Other specified counseling: Secondary | ICD-10-CM

## 2015-02-13 DIAGNOSIS — E08621 Diabetes mellitus due to underlying condition with foot ulcer: Secondary | ICD-10-CM

## 2015-02-13 DIAGNOSIS — L97529 Non-pressure chronic ulcer of other part of left foot with unspecified severity: Secondary | ICD-10-CM

## 2015-02-13 DIAGNOSIS — Z89422 Acquired absence of other left toe(s): Secondary | ICD-10-CM | POA: Diagnosis not present

## 2015-02-13 DIAGNOSIS — Z794 Long term (current) use of insulin: Secondary | ICD-10-CM

## 2015-02-13 LAB — COMPREHENSIVE METABOLIC PANEL
ALT: 24 U/L (ref 0–53)
AST: 17 U/L (ref 0–37)
Albumin: 4.2 g/dL (ref 3.5–5.2)
Alkaline Phosphatase: 39 U/L (ref 39–117)
BUN: 20 mg/dL (ref 6–23)
CHLORIDE: 104 meq/L (ref 96–112)
CO2: 28 meq/L (ref 19–32)
CREATININE: 1.13 mg/dL (ref 0.40–1.50)
Calcium: 10.3 mg/dL (ref 8.4–10.5)
GFR: 68.85 mL/min (ref >60.00)
Glucose, Bld: 186 mg/dL — ABNORMAL HIGH (ref 70–99)
Potassium: 4.5 mEq/L (ref 3.5–5.1)
SODIUM: 141 meq/L (ref 135–145)
Total Bilirubin: 0.5 mg/dL (ref 0.2–1.2)
Total Protein: 7.3 g/dL (ref 6.0–8.3)

## 2015-02-13 LAB — CBC WITH DIFFERENTIAL/PLATELET
BASOS PCT: 0.8 % (ref 0.0–3.0)
Basophils Absolute: 0.1 10*3/uL (ref 0.0–0.1)
EOS ABS: 0.4 10*3/uL (ref 0.0–0.7)
EOS PCT: 3.9 % (ref 0.0–5.0)
HEMATOCRIT: 43.3 % (ref 39.0–52.0)
Hemoglobin: 14.5 g/dL (ref 13.0–17.0)
LYMPHS PCT: 15.3 % (ref 12.0–46.0)
Lymphs Abs: 1.5 10*3/uL (ref 0.7–4.0)
MCHC: 33.4 g/dL (ref 30.0–36.0)
MCV: 84.8 fl (ref 78.0–100.0)
MONO ABS: 0.6 10*3/uL (ref 0.1–1.0)
Monocytes Relative: 6.3 % (ref 3.0–12.0)
NEUTROS ABS: 7.2 10*3/uL (ref 1.4–7.7)
Neutrophils Relative %: 73.7 % (ref 43.0–77.0)
PLATELETS: 208 10*3/uL (ref 150.0–400.0)
RBC: 5.11 Mil/uL (ref 4.22–5.81)
RDW: 16.7 % — AB (ref 11.5–15.5)
WBC: 9.7 10*3/uL (ref 4.0–10.5)

## 2015-02-13 LAB — LIPID PANEL
CHOL/HDL RATIO: 4
CHOLESTEROL: 121 mg/dL (ref 0–200)
HDL: 28.2 mg/dL — ABNORMAL LOW (ref >39.00)
NONHDL: 93.29
Triglycerides: 205 mg/dL — ABNORMAL HIGH (ref 0.0–149.0)
VLDL: 41 mg/dL — ABNORMAL HIGH (ref 0.0–40.0)

## 2015-02-13 LAB — LDL CHOLESTEROL, DIRECT: Direct LDL: 72 mg/dL

## 2015-02-13 LAB — HM DIABETES FOOT EXAM

## 2015-02-13 LAB — T4, FREE: Free T4: 0.91 ng/dL (ref 0.60–1.60)

## 2015-02-13 NOTE — Assessment & Plan Note (Signed)
Site looks clean 

## 2015-02-13 NOTE — Assessment & Plan Note (Signed)
Discussed better eating-- consider chair exercises

## 2015-02-13 NOTE — Assessment & Plan Note (Signed)
Stable on plantar surface Just won't heal Dr [name] manages

## 2015-02-13 NOTE — Assessment & Plan Note (Signed)
See social history 

## 2015-02-13 NOTE — Assessment & Plan Note (Signed)
I have personally reviewed the Medicare Annual Wellness questionnaire and have noted 1. The patient's medical and social history 2. Their use of alcohol, tobacco or illicit drugs 3. Their current medications and supplements 4. The patient's functional ability including ADL's, fall risks, home safety risks and hearing or visual             impairment. 5. Diet and physical activities 6. Evidence for depression or mood disorders  The patients weight, height, BMI and visual acuity have been recorded in the chart I have made referrals, counseling and provided education to the patient based review of the above and I have provided the pt with a written personalized care plan for preventive services.  I have provided you with a copy of your personalized plan for preventive services. Please take the time to review along with your updated medication list.  UTD on imms Colon due 2019 PSA via Dr Dionisio Paschal been low and considered in remission after RT Discussed fitness and weight loss

## 2015-02-13 NOTE — Assessment & Plan Note (Signed)
Chronic bone changes but no infection now

## 2015-02-13 NOTE — Progress Notes (Signed)
Pre visit review using our clinic review tool, if applicable. No additional management support is needed unless otherwise documented below in the visit note. 

## 2015-02-13 NOTE — Assessment & Plan Note (Signed)
Slipped with control Working on better eating recently Follows with Dr [name]

## 2015-02-13 NOTE — Progress Notes (Signed)
Subjective:    Patient ID: [name], male    DOB: September 14, 1950, 65 y.o.   MRN: [id number]  HPI Here with live-in girlfriend for Medicare wellness and follow up of chronic medical conditions Reviewed form and advanced directives Reviewed other doctors--Dr [name] for diabetes, Dr Alinda Money for prostate cancer, Dr Claudean Kinds for eyes, hasn't seen the derm (he needs to go) No alcohol or tobacco No falls Vision is fine. Hearing isn't good--- stable (told hearing aide wouldn't help) No depression or anhedonia Walks with walker. Girlfriend does much of the instrumental ADLs--but he can do them No apparent cognitive problems  Continues with Dr [name] Working harder on the diabetes---through improved diet Sugars are down recently Same sore on left foot--stable. Follows regularly with Dr [name] Weight is up some though No foot pain--no sensation of note  Has detectable PSA but very low He thinks the cancer is quiet for now Recent cystoscopy negative---had hematuriac  No chest pain No palpitations No dizziness or syncope Mild left foot edema---wears compression sock  Current Outpatient Prescriptions on File Prior to Visit  Medication Sig Dispense Refill  . aspirin 325 MG tablet Take 325 mg by mouth daily.      . B-D UF III MINI PEN NEEDLES 31G X 5 MM MISC USE TO INJECT INSULIN 4 TIMES DAILY AS INSTRUCTED FOR DIABETES 360 each 2  . carvedilol (COREG) 25 MG tablet TAKE 1 TABLET TWICE DAILY 180 tablet 3  . FREESTYLE INSULINX TEST test strip 1 each by Other route See admin instructions. Check blood sugar 3 times daily.    Marland Kitchen HUMALOG KWIKPEN 100 UNIT/ML KiwkPen INJECT  30  TO  35 UNITS SUBCUTANEOUSLY THREE TIMES DAILY 90 mL 1  . losartan-hydrochlorothiazide (HYZAAR) 50-12.5 MG tablet TAKE 1 TABLET EVERY DAY 90 tablet 3  . metFORMIN (GLUCOPHAGE) 1000 MG tablet TAKE 1 TABLET TWICE DAILY 180 tablet 0  . Multiple Vitamin (MULTIVITAMIN) tablet Take 1 tablet by mouth daily.      . naproxen sodium  (ANAPROX) 220 MG tablet Take 440 mg by mouth 2 (two) times daily as needed (for pain.). For pain    . potassium citrate (UROCIT-K) 10 MEQ (1080 MG) SR tablet Take 20 mEq by mouth 3 (three) times daily.  12  . silver sulfADIAZINE (SILVADENE) 1 % cream   3  . TOUJEO SOLOSTAR 300 UNIT/ML SOPN INJECT  80 UNITS SUBCUTANEOUSLY EVERY DAY 15 pen 0  . triamcinolone (KENALOG) 0.025 % cream APPLY 1 APPLICATION TOPICALLY 3 (THREE) TIMES DAILY AS NEEDED. 30 g 3   No current facility-administered medications on file prior to visit.    Allergies  Allergen Reactions  . Atorvastatin Other (See Comments)    REACTION: myalgias  . Ezetimibe Other (See Comments)    Body ache  . Simvastatin Other (See Comments)    Body ache    Past Medical History  Diagnosis Date  . Malignant neoplasm of prostate ([place])   . Coronary atherosclerosis of unspecified type of vessel, native or graft   . Type II or unspecified type diabetes mellitus with neurological manifestations, not stated as uncontrolled   . Impotence of organic origin   . Unspecified glaucoma   . Internal hemorrhoids without mention of complication   . Other and unspecified hyperlipidemia   . Mononeuritis of unspecified site   . Osteoarthrosis, unspecified whether generalized or localized, unspecified site   . Personal history of colonic polyps   . Routine general medical examination at a health care facility   .  Hemorrhage of rectum and anus   . Unspecified venous (peripheral) insufficiency   . Personal history of gallstones   . Personal history of diabetic foot ulcer     saw wound center, resolved 05/08/2010  . Unspecified sleep apnea     [facility]pcp    Past Surgical History  Procedure Laterality Date  . Cardiac catheterization  1998    Negative  . Thrombosed vein  1993    Right leg  . Kidney stone surgery  04/1993  . Retinal detachment surgery  2002-2003  . Rca stents  04/2003    EF 55%  . Coronary artery bypass graft  09/2005    Post op AFIB  .  Insertion prostate radiation seed  2009    RT and seeds for prostate cancer  . Amputation Left 12/30/2012    Procedure: AMPUTATION RAY ;  Surgeon: Meredith Pel, MD;  Location: [facility];  Service: Orthopedics;  Laterality: Left;  LEFT GREAT TOE RAY AMPUTATION  . Eye surgery    . Foot bone excision Left 11/03/2013    DR DUDA   . I&d extremity Left 11/03/2013    Procedure: Left Foot Partial Bone Excision Cuboid and Medial Cuneiform, Wound Closures;  Surgeon: [name], MD;  Location: [facility];  Service: Orthopedics;  Laterality: Left;  . Basal cell carcinoma excision  2/16    left forearm    Family History  Problem Relation Age of Onset  . Lung cancer Father   . Multiple sclerosis Mother   . Heart attack      paternal aunts and uncles  . Peripheral vascular disease Maternal Grandfather     several amputations    Social History   Social History  . Marital Status: Widowed    Spouse Name: N/A  . Number of Children: 3  . Years of Education: N/A   Occupational History  . Heavy equipment mechanic--retired    Social History Main Topics  . Smoking status: Former Smoker -- 2.00 packs/day for 35 years    Types: Cigars, Cigarettes    Quit date: 01/07/2001  . Smokeless tobacco: Never Used  . Alcohol Use: Yes     Comment: rare  . Drug Use: No  . Sexual Activity:    Partners: Female   Other Topics Concern  . Not on file   Social History Narrative   Has living will   Daughter [name] is [place] health care POA    Would accept resuscitation attempts--but no prolonged artificial ventilation   No tube feeds if cognitively unaware   Review of Systems Appetite is good Sleeps well--- sleeps fine with the CPAP (uses old machine without autotitrate. ?7cm) Weight up a few pounds Teeth are fine---overdue with dentist No suspicious lesions Widespread joint pains--only uses aleve rarely Bowels are okay. No blood in stool. Voids okay--no trouble with stream No recent kidney  stones    Objective:   Physical Exam  Constitutional: He is oriented to person, place, and time. He appears well-developed. No distress.  HENT:  Mouth/Throat: Oropharynx is clear and moist. No oropharyngeal exudate.  Neck: Normal range of motion. Neck supple. No thyromegaly present.  Cardiovascular: Normal rate, regular rhythm and normal heart sounds.  Exam reveals no gallop.   No murmur heard. Faint pedal pulses  Pulmonary/Chest: Effort normal and breath sounds normal. No respiratory distress. He has no wheezes. He has no rales.  Musculoskeletal:  Left great toe absent Trace pedal edema  Lymphadenopathy:    He  has no cervical adenopathy.  Neurological: He is alert and oriented to person, place, and time.  President-- "Daisy Floro, [address] [place]" [phone] [id number] Recall 3/3  ~no sensation in feet  Skin:  Still has left plantar 49mm ulcer midfoot. Not inflamed  Psychiatric: He has a normal mood and affect. His behavior is normal.          Assessment & Plan:

## 2015-02-14 ENCOUNTER — Encounter: Payer: Self-pay | Admitting: *Deleted

## 2015-02-16 ENCOUNTER — Other Ambulatory Visit: Payer: Self-pay | Admitting: *Deleted

## 2015-02-16 MED ORDER — INSULIN ASPART 100 UNIT/ML FLEXPEN: 30.0000 [IU] | PEN_INJECTOR | Freq: Three times a day (TID) | SUBCUTANEOUS | Status: DC

## 2015-03-05 DIAGNOSIS — H33011 Retinal detachment with single break, right eye: Secondary | ICD-10-CM | POA: Diagnosis not present

## 2015-03-05 DIAGNOSIS — H59812 Chorioretinal scars after surgery for detachment, left eye: Secondary | ICD-10-CM | POA: Diagnosis not present

## 2015-03-05 DIAGNOSIS — H33001 Unspecified retinal detachment with retinal break, right eye: Secondary | ICD-10-CM | POA: Diagnosis not present

## 2015-03-05 DIAGNOSIS — H43391 Other vitreous opacities, right eye: Secondary | ICD-10-CM | POA: Diagnosis not present

## 2015-03-05 DIAGNOSIS — H2511 Age-related nuclear cataract, right eye: Secondary | ICD-10-CM | POA: Diagnosis not present

## 2015-03-06 DIAGNOSIS — H4311 Vitreous hemorrhage, right eye: Secondary | ICD-10-CM | POA: Diagnosis not present

## 2015-03-06 DIAGNOSIS — Z09 Encounter for follow-up examination after completed treatment for conditions other than malignant neoplasm: Secondary | ICD-10-CM | POA: Diagnosis not present

## 2015-03-10 DIAGNOSIS — E11610 Type 2 diabetes mellitus with diabetic neuropathic arthropathy: Secondary | ICD-10-CM | POA: Diagnosis not present

## 2015-03-10 DIAGNOSIS — L97421 Non-pressure chronic ulcer of left heel and midfoot limited to breakdown of skin: Secondary | ICD-10-CM | POA: Diagnosis not present

## 2015-03-27 DIAGNOSIS — H4061X0 Glaucoma secondary to drugs, right eye, stage unspecified: Secondary | ICD-10-CM | POA: Diagnosis not present

## 2015-03-29 ENCOUNTER — Ambulatory Visit (INDEPENDENT_AMBULATORY_CARE_PROVIDER_SITE_OTHER): Payer: Medicare Other | Admitting: Internal Medicine

## 2015-03-29 ENCOUNTER — Other Ambulatory Visit (INDEPENDENT_AMBULATORY_CARE_PROVIDER_SITE_OTHER): Payer: Medicare Other | Admitting: *Deleted

## 2015-03-29 ENCOUNTER — Encounter: Payer: Self-pay | Admitting: Internal Medicine

## 2015-03-29 VITALS — BP 118/62 | HR 90 | Temp 97.6°F | Resp 12 | Wt 311.0 lb

## 2015-03-29 DIAGNOSIS — E1142 Type 2 diabetes mellitus with diabetic polyneuropathy: Secondary | ICD-10-CM

## 2015-03-29 DIAGNOSIS — Z794 Long term (current) use of insulin: Secondary | ICD-10-CM | POA: Insufficient documentation

## 2015-03-29 LAB — POCT GLYCOSYLATED HEMOGLOBIN (HGB A1C): Hemoglobin A1C: 7.5

## 2015-03-29 NOTE — Progress Notes (Signed)
Patient ID: [name], male   DOB: [birth year], 65 y.o.   MRN: [id number]  HPI: [name] is a 65 y.o.-year-old male, returning for f/u for DM2, dx 2008, insulin-dependent since dx, uncontrolled, with complications (peripheral neuropathy, CAD-status post CABG in 2008, PVD, Charcot foot, history of diabetic foot ulcer, history of retinal detachment in 2002-3). Last visit 3 mo ago. 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   He had a detached retina 2 weeks ago >> had Laser sx (his 3rd).  Still has a L foot diabetic ulcer >> sees Dr [name] regularly, next visit: tomorrow..  Last hemoglobin A1c was: Lab Results  Component Value Date   HGBA1C 7.9 12/29/2014   HGBA1C 7.5 08/09/2014   HGBA1C 7.1* 05/09/2014   Pt is on a regimen of: - Metformin 1000 mg po bid - Toujeo 50 units 2x a day - Humalog at mealtime as follows:  30 units with a smaller meal 35 units with a larger meal or dinner 10 units with snack after dinner We stopped Glipizide 5 mg.  Pt checks his sugars 1-3x a day and they are higher - reviewed sugar log: - am: 116-203 >> 154-200, 256 >> 144-230 >> 116-221, 309 >> 173-255 >> 132, 187-220 >> 152-193, 210, 257 - before lunch: 165-198 >> 82-145 >> 135-193 >> 120-150 >> n/c >> 215-245 >> n/c >> 134, 170 >> 187 >> 138-201 - before dinner: 114-180 >> 171-205 >> n/c >> 140-178 >> 129-179 >> 92, 108-164, 193 >> 150-211, 227 >> 130--177, 197 - bedtime: 162-246 >> 112-254 - last 3 weeks >> n/c >> 244 >> n/c >> 194-310 >> 117, 175-236  No lows. Lowest: 92 before dinner >> 117. ? hypoglycemia awareness. Highest sugar was 310 >> 260 >> upper 200s.  Pt's meals are: - Breakfast: bacon, eggs, tomato sandwich (sometimes no bread) - Lunch: meat + 2 veggies (no potatoes) - Dinner: meat + 2 veggies - Snacks: 1 a day - 2x a week, icecream  Uses Splenda in tea and coffee.  - + mild CKD, last BUN/creatinine:  Lab Results  Component Value Date   BUN  20 02/13/2015   CREATININE 1.13 02/13/2015  On Losartan. - last set of lipids: Lab Results  Component Value Date   CHOL 121 02/13/2015   HDL 28.20* 02/13/2015   LDLCALC 73 01/19/2013   LDLDIRECT 72.0 02/13/2015   TRIG 205.0* 02/13/2015   CHOLHDL 4 02/13/2015  Not on a statin as intolerant. - last eye exam was 08/09/2014. No DR. Dr Claudean Kinds.  - + numbness and tingling in his feet. No sensation in his feet.  He had amputation of his big toe in 12/30/2012 2/2 infection >> osteomyelitis. He was hospitalized for foot ulcer (bottom of L foot) in 03/2013 >> boot. MRI: no Osteomyelitis, had gas gangrene and abscesses >> debrided and tx with ABx.  He had 3 foot ulcers >> osteomyelitis >> had L partial bone excision 11/03/2013 - Dr [name]. Xray 07/2014. He has a h/o Charcot foot.    He also has a history of Prostate cancer, s/p 25 RxTx, then radioactive seeds. Also, hyperlipidemia, hypertension, sleep apnea, osteoarthrosis.   ROS: Constitutional: no weight loss/gain, no fatigue, no subjective hyperthermia/hypothermia Eyes: no blurry vision, no xerophthalmia ENT: no sore throat, no nodules palpated in throat, no dysphagia/odynophagia, no hoarseness Cardiovascular: no CP/SOB/palpitations/no leg swelling Respiratory: no cough/SOB Gastrointestinal: no N/V/D/no C Musculoskeletal: no muscle/no joint aches Skin: no rashes, +  ulcer L foot sole  Neurological: no tremors/numbness/tingling/dizziness  I reviewed pt's medications, allergies, PMH, social hx, family hx, and changes were documented in the history of present illness. Otherwise, unchanged from my initial visit note.  PE: BP 118/62 mmHg  Pulse 90  Temp(Src) 97.6 F (36.4 C) (Oral)  Resp 12  Wt 311 lb (141.069 kg)  SpO2 96% Body mass index is 39.91 kg/(m^2).  Wt Readings from Last 3 Encounters:  03/29/15 311 lb (141.069 kg)  02/13/15 313 lb (141.976 kg)  12/29/14 312 lb (141.522 kg)   Constitutional: obese, in NAD, ruddy  complexion Eyes: PERRLA, EOMI, no exophthalmos ENT: moist mucous membranes, no thyromegaly, no cervical lymphadenopathy; large neck Cardiovascular: RRR, No MRG, + bilateral leg edema, pitting. Respiratory: CTA B Gastrointestinal: abdomen soft, NT, ND, BS+ Musculoskeletal: no deformities, strength intact in all 4 Skin: moist, warm, stasis dermatitis in bilateral legs  ASSESSMENT: 1. DM2, insulin-dependent, uncontrolled, with complications - peripheral neuropathy - CAD-status post stent in 2005, then 5v CABG in 2008 - PVD - history of diabetic foot ulcer - Charcot foot - L - h/o amputation of his big toe in 12/30/2012 2/2 infection >> osteomyelitis. - history of retinal detachment in 2002-3  PLAN:  1. Patient uncontrolled DM2 b/c late snack at night, on basal -bolus insulin regimen + metformin. Sugars are better after increasing Toujeo, but still high in am >> advised to move all Metformin dose at dinnertime. He also started to work on his diet >> continue this. May need U500 insulin soon.  - I advised him to:  Patient Instructions  Please move: - Metformin the entire dose to dinnertime (2000 mg)  Continue: - Toujeo 50 units x2 daily - Humalog at mealtime as follows:   30 units with a smaller meal  35 units with a larger meal or dinner  10 units with snack after dinner  Please return in 3 months with your sugar log.    - continue checking sugars at different times of the day - check 3 times a day, rotating checks - also check at bedtime - advised for yearly eye exams >> up to date - given  flu shot at last visit - check Hba1c today >> 7.5% (better) - Return to clinic in 3 month with sugar log

## 2015-03-29 NOTE — Patient Instructions (Signed)
Please move: - Metformin the entire dose to dinnertime (2000 mg)  Continue: - Toujeo 50 units x2 daily - Humalog at mealtime as follows:   30 units with a smaller meal  35 units with a larger meal or dinner  10 units with snack after dinner  Please return in 3 months with your sugar log.

## 2015-03-30 DIAGNOSIS — L97421 Non-pressure chronic ulcer of left heel and midfoot limited to breakdown of skin: Secondary | ICD-10-CM | POA: Diagnosis not present

## 2015-03-31 ENCOUNTER — Other Ambulatory Visit: Payer: Self-pay | Admitting: Internal Medicine

## 2015-04-20 DIAGNOSIS — L97421 Non-pressure chronic ulcer of left heel and midfoot limited to breakdown of skin: Secondary | ICD-10-CM | POA: Diagnosis not present

## 2015-04-25 DIAGNOSIS — H2513 Age-related nuclear cataract, bilateral: Secondary | ICD-10-CM | POA: Diagnosis not present

## 2015-04-25 DIAGNOSIS — Z0100 Encounter for examination of eyes and vision without abnormal findings: Secondary | ICD-10-CM | POA: Diagnosis not present

## 2015-05-03 DIAGNOSIS — H25811 Combined forms of age-related cataract, right eye: Secondary | ICD-10-CM | POA: Diagnosis not present

## 2015-05-03 DIAGNOSIS — H2511 Age-related nuclear cataract, right eye: Secondary | ICD-10-CM | POA: Diagnosis not present

## 2015-05-11 DIAGNOSIS — I87333 Chronic venous hypertension (idiopathic) with ulcer and inflammation of bilateral lower extremity: Secondary | ICD-10-CM | POA: Diagnosis not present

## 2015-05-11 DIAGNOSIS — E11610 Type 2 diabetes mellitus with diabetic neuropathic arthropathy: Secondary | ICD-10-CM | POA: Diagnosis not present

## 2015-05-11 DIAGNOSIS — L97421 Non-pressure chronic ulcer of left heel and midfoot limited to breakdown of skin: Secondary | ICD-10-CM | POA: Diagnosis not present

## 2015-05-11 DIAGNOSIS — I83229 Varicose veins of left lower extremity with both ulcer of unspecified site and inflammation: Secondary | ICD-10-CM | POA: Diagnosis not present

## 2015-05-23 ENCOUNTER — Other Ambulatory Visit: Payer: Self-pay | Admitting: Internal Medicine

## 2015-05-25 DIAGNOSIS — I83229 Varicose veins of left lower extremity with both ulcer of unspecified site and inflammation: Secondary | ICD-10-CM | POA: Diagnosis not present

## 2015-05-25 DIAGNOSIS — E11610 Type 2 diabetes mellitus with diabetic neuropathic arthropathy: Secondary | ICD-10-CM | POA: Diagnosis not present

## 2015-05-25 DIAGNOSIS — L97421 Non-pressure chronic ulcer of left heel and midfoot limited to breakdown of skin: Secondary | ICD-10-CM | POA: Diagnosis not present

## 2015-05-25 DIAGNOSIS — L97211 Non-pressure chronic ulcer of right calf limited to breakdown of skin: Secondary | ICD-10-CM | POA: Diagnosis not present

## 2015-06-20 DIAGNOSIS — L97421 Non-pressure chronic ulcer of left heel and midfoot limited to breakdown of skin: Secondary | ICD-10-CM | POA: Diagnosis not present

## 2015-06-20 DIAGNOSIS — L97211 Non-pressure chronic ulcer of right calf limited to breakdown of skin: Secondary | ICD-10-CM | POA: Diagnosis not present

## 2015-06-27 DIAGNOSIS — H02052 Trichiasis without entropian right lower eyelid: Secondary | ICD-10-CM | POA: Diagnosis not present

## 2015-06-30 DIAGNOSIS — I83229 Varicose veins of left lower extremity with both ulcer of unspecified site and inflammation: Secondary | ICD-10-CM | POA: Diagnosis not present

## 2015-06-30 DIAGNOSIS — L97421 Non-pressure chronic ulcer of left heel and midfoot limited to breakdown of skin: Secondary | ICD-10-CM | POA: Diagnosis not present

## 2015-06-30 DIAGNOSIS — E11610 Type 2 diabetes mellitus with diabetic neuropathic arthropathy: Secondary | ICD-10-CM | POA: Diagnosis not present

## 2015-07-03 DIAGNOSIS — N201 Calculus of ureter: Secondary | ICD-10-CM | POA: Diagnosis not present

## 2015-07-04 ENCOUNTER — Ambulatory Visit: Payer: Medicare Other | Admitting: Internal Medicine

## 2015-07-04 DIAGNOSIS — I87333 Chronic venous hypertension (idiopathic) with ulcer and inflammation of bilateral lower extremity: Secondary | ICD-10-CM | POA: Diagnosis not present

## 2015-07-04 DIAGNOSIS — I83229 Varicose veins of left lower extremity with both ulcer of unspecified site and inflammation: Secondary | ICD-10-CM | POA: Diagnosis not present

## 2015-07-04 DIAGNOSIS — L97421 Non-pressure chronic ulcer of left heel and midfoot limited to breakdown of skin: Secondary | ICD-10-CM | POA: Diagnosis not present

## 2015-07-04 DIAGNOSIS — L97211 Non-pressure chronic ulcer of right calf limited to breakdown of skin: Secondary | ICD-10-CM | POA: Diagnosis not present

## 2015-07-05 ENCOUNTER — Other Ambulatory Visit: Payer: Self-pay | Admitting: Internal Medicine

## 2015-07-14 DIAGNOSIS — L97211 Non-pressure chronic ulcer of right calf limited to breakdown of skin: Secondary | ICD-10-CM | POA: Diagnosis not present

## 2015-07-14 DIAGNOSIS — I83229 Varicose veins of left lower extremity with both ulcer of unspecified site and inflammation: Secondary | ICD-10-CM | POA: Diagnosis not present

## 2015-07-14 DIAGNOSIS — I87333 Chronic venous hypertension (idiopathic) with ulcer and inflammation of bilateral lower extremity: Secondary | ICD-10-CM | POA: Diagnosis not present

## 2015-07-14 DIAGNOSIS — L97421 Non-pressure chronic ulcer of left heel and midfoot limited to breakdown of skin: Secondary | ICD-10-CM | POA: Diagnosis not present

## 2015-07-19 DIAGNOSIS — C61 Malignant neoplasm of prostate: Secondary | ICD-10-CM | POA: Diagnosis not present

## 2015-07-26 DIAGNOSIS — C61 Malignant neoplasm of prostate: Secondary | ICD-10-CM | POA: Diagnosis not present

## 2015-07-26 DIAGNOSIS — N202 Calculus of kidney with calculus of ureter: Secondary | ICD-10-CM | POA: Diagnosis not present

## 2015-07-28 ENCOUNTER — Other Ambulatory Visit: Payer: Self-pay

## 2015-07-28 MED ORDER — INSULIN PEN NEEDLE 31G X 5 MM MISC: Status: DC

## 2015-07-31 ENCOUNTER — Other Ambulatory Visit: Payer: Self-pay | Admitting: *Deleted

## 2015-07-31 MED ORDER — LOSARTAN POTASSIUM-HCTZ 50-12.5 MG PO TABS: 1.0000 | ORAL_TABLET | Freq: Every day | ORAL | 2 refills | Status: DC

## 2015-08-01 NOTE — Telephone Encounter (Signed)
Pt wants to ck on status of losartan HCTZ refill; advised pt sent on 07/31/15; pt will ck with pharmacy.

## 2015-08-04 DIAGNOSIS — L97211 Non-pressure chronic ulcer of right calf limited to breakdown of skin: Secondary | ICD-10-CM | POA: Diagnosis not present

## 2015-08-04 DIAGNOSIS — I83229 Varicose veins of left lower extremity with both ulcer of unspecified site and inflammation: Secondary | ICD-10-CM | POA: Diagnosis not present

## 2015-08-04 DIAGNOSIS — L97421 Non-pressure chronic ulcer of left heel and midfoot limited to breakdown of skin: Secondary | ICD-10-CM | POA: Diagnosis not present

## 2015-08-04 DIAGNOSIS — I87333 Chronic venous hypertension (idiopathic) with ulcer and inflammation of bilateral lower extremity: Secondary | ICD-10-CM | POA: Diagnosis not present

## 2015-08-08 DIAGNOSIS — H04123 Dry eye syndrome of bilateral lacrimal glands: Secondary | ICD-10-CM | POA: Diagnosis not present

## 2015-08-08 DIAGNOSIS — Z961 Presence of intraocular lens: Secondary | ICD-10-CM | POA: Diagnosis not present

## 2015-08-29 DIAGNOSIS — I87333 Chronic venous hypertension (idiopathic) with ulcer and inflammation of bilateral lower extremity: Secondary | ICD-10-CM | POA: Diagnosis not present

## 2015-08-29 DIAGNOSIS — I83229 Varicose veins of left lower extremity with both ulcer of unspecified site and inflammation: Secondary | ICD-10-CM | POA: Diagnosis not present

## 2015-08-31 ENCOUNTER — Encounter: Payer: Self-pay | Admitting: Internal Medicine

## 2015-08-31 ENCOUNTER — Ambulatory Visit (INDEPENDENT_AMBULATORY_CARE_PROVIDER_SITE_OTHER): Payer: Medicare Other | Admitting: Internal Medicine

## 2015-08-31 VITALS — BP 132/80 | HR 100 | Ht 72.0 in | Wt 312.0 lb

## 2015-08-31 DIAGNOSIS — Z794 Long term (current) use of insulin: Secondary | ICD-10-CM

## 2015-08-31 DIAGNOSIS — E1142 Type 2 diabetes mellitus with diabetic polyneuropathy: Secondary | ICD-10-CM | POA: Diagnosis not present

## 2015-08-31 LAB — POCT GLYCOSYLATED HEMOGLOBIN (HGB A1C): Hemoglobin A1C: 8

## 2015-08-31 MED ORDER — INSULIN ASPART 100 UNIT/ML FLEXPEN: PEN_INJECTOR | SUBCUTANEOUS | 1 refills | Status: DC

## 2015-08-31 MED ORDER — INSULIN GLARGINE 300 UNIT/ML ~~LOC~~ SOPN: 60.0000 [IU] | PEN_INJECTOR | Freq: Two times a day (BID) | SUBCUTANEOUS | 1 refills | Status: DC

## 2015-08-31 NOTE — Progress Notes (Signed)
Patient ID: [name], male   DOB: 01/18/1950, 65 y.o.   MRN: [id number]  HPI: [name] is a 65 y.o.-year-old male, returning for f/u for DM2, dx 2008, insulin-dependent since dx, uncontrolled, with complications (peripheral neuropathy, CAD-status post CABG in 2008, PVD, Charcot foot, history of diabetic foot ulcer, history of retinal detachment in 2002-3). Last visit 5 mo ago. 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   He had a kidney stone since last visit.  Last hemoglobin A1c was: Lab Results  Component Value Date   HGBA1C 7.5 03/29/2015   HGBA1C 7.9 12/29/2014   HGBA1C 7.5 08/09/2014   Pt is on a regimen of: - Metformin 2000 mg with dinner - Toujeo 50 units 2x a day >> 100 units at night - Humalog at mealtime as follows:  30 units with a smaller meal 35 units with a larger meal or dinner 10 units with snack after dinner We stopped Glipizide 5 mg.  Pt checks his sugars 1-3x a day and they are higher - reviewed sugar log: - am: 154-200, 256 >> 144-230 >> 116-221, 309 >> 173-255 >> 132, 187-220 >> 152-193, 210, 257 >> 148-196, 208 - before lunch: 82-145 >> 135-193 >> 120-150 >> n/c >> 215-245 >> n/c >> 134, 170 >> 187 >> 138-201 >> n/c - before dinner: 140-178 >> 129-179 >> 92, 108-164, 193 >> 150-211, 227 >> 130--177, 197 >> 148-256 - bedtime: 162-246 >> 112-254 - last 3 weeks >> n/c >> 244 >> n/c >> 194-310 >> 117, 175-236 >> n/c No lows. Lowest: 92 before dinner >> 117 >>  ? hypoglycemia awareness.  Highest sugar was 310 >> 260 >> upper 200s >>   Pt's meals are: - Breakfast: bacon, eggs, tomato sandwich (sometimes no bread) - Lunch: meat + 2 veggies (no potatoes) - Dinner: meat + 2 veggies - Snacks: 1 a day - 2x a week, icecream  Uses Splenda in tea and coffee.  - + mild CKD, last BUN/creatinine:  Lab Results  Component Value Date   BUN 20 02/13/2015   CREATININE 1.13 02/13/2015  On Losartan. - last set of lipids: Lab  Results  Component Value Date   CHOL 121 02/13/2015   HDL 28.20 (L) 02/13/2015   LDLCALC 73 01/19/2013   LDLDIRECT 72.0 02/13/2015   TRIG 205.0 (H) 02/13/2015   CHOLHDL 4 02/13/2015  Not on a statin as intolerant. - last eye exam was 08/09/2014. No DR. Dr Claudean Kinds. He had a detached retina in 03/2015 >> had Laser sx (his 3rd). He had cataract sx (R) 06/2015. - + numbness and tingling in his feet. No sensation in his feet.   He had amputation of his big toe in 12/30/2012 2/2 infection >> osteomyelitis.  He was hospitalized for foot ulcer (bottom of L foot) in 03/2013 >> boot. MRI: no Osteomyelitis, had gas gangrene and abscesses >> debrided and tx with ABx.   He had 3 foot ulcers >> osteomyelitis >> had L partial bone excision 11/03/2013 - Dr [name]. He has a h/o Charcot foot.    He also has a history of Prostate cancer, s/p 25 RxTx, then radioactive seeds. Also, hyperlipidemia, hypertension, sleep apnea, osteoarthrosis.   ROS: Constitutional: no weight loss/gain, no fatigue, no subjective hyperthermia/hypothermia Eyes: no blurry vision, no xerophthalmia ENT: no sore throat, no nodules palpated in throat, no dysphagia/odynophagia, no hoarseness Cardiovascular: no CP/SOB/palpitations/no leg swelling Respiratory: no cough/SOB Gastrointestinal: no N/V/D/no C Musculoskeletal: no  muscle/no joint aches; + Charcot foot Skin: no rashes, + ulcer L foot sole Neurological: no tremors/numbness/tingling/dizziness  I reviewed pt's medications, allergies, PMH, social hx, family hx, and changes were documented in the history of present illness. Otherwise, unchanged from my initial visit note.  PE: BP 132/80 (BP Location: Left Arm, Patient Position: Sitting)   Pulse 100   Ht 6' (1.829 m)   Wt (!) 312 lb (141.5 kg)   SpO2 96%   BMI 42.31 kg/m  Body mass index is 42.31 kg/m.  Wt Readings from Last 3 Encounters:  08/31/15 (!) 312 lb (141.5 kg)  03/29/15 (!) 311 lb (141.1 kg)  02/13/15 (!) 313  lb (142 kg)   Constitutional: obese, in NAD, ruddy complexion Eyes: PERRLA, EOMI, no exophthalmos ENT: moist mucous membranes, no thyromegaly, no cervical lymphadenopathy; large neck Cardiovascular: RRR, No MRG, + bilateral leg edema, pitting, L foot in boot - walks with walker Respiratory: CTA B Gastrointestinal: abdomen soft, NT, ND, BS+ Musculoskeletal: no deformities, strength intact in all 4 Skin: moist, warm, stasis dermatitis in bilateral legs  ASSESSMENT: 1. DM2, insulin-dependent, uncontrolled, with complications - peripheral neuropathy - CAD-status post stent in 2005, then 5v CABG in 2008 - PVD - history of diabetic foot ulcer - Charcot foot - L - h/o amputation of his big toe in 12/30/2012 2/2 infection >> osteomyelitis. - history of retinal detachment in 2002-3  PLAN:  1. Patient uncontrolled DM2 on basal -bolus insulin regimen + metformin. Sugars are worse >> will need to increase insulin. We also discussed about improving diet and the absolute need to lose weight. May need U500 insulin soon.  - I advised him to:  Patient Instructions  Please continue: - Metformin 2000 mg with dinner  Please increase:  - Toujeo 60 units 2x a day - Humalog at mealtime as follows:   40units with a smaller meal  45 units with a larger meal or dinner  10 units with snack after dinner  Please return in 3 months with your sugar log.    - continue checking sugars at different times of the day - check 3 times a day, rotating checks - also check at bedtime - advised for yearly eye exams >> up to date - check Hba1c today >> 8% (worse!) - Return to clinic in 3 months with sugar log   Philemon Kingdom, MD PhD [facility]

## 2015-08-31 NOTE — Addendum Note (Signed)
Addended by: Caprice Beaver T on: 08/31/2015 09:53 AM   Modules accepted: Orders

## 2015-08-31 NOTE — Patient Instructions (Addendum)
Please continue: - Metformin 2000 mg with dinner  Please increase:  - Toujeo 60 units 2x a day - Humalog at mealtime as follows:   40units with a smaller meal  45 units with a larger meal or dinner  10 units with snack after dinner  Please return in 3 months with your sugar log.

## 2015-09-21 DIAGNOSIS — E1142 Type 2 diabetes mellitus with diabetic polyneuropathy: Secondary | ICD-10-CM | POA: Diagnosis not present

## 2015-09-21 DIAGNOSIS — L97421 Non-pressure chronic ulcer of left heel and midfoot limited to breakdown of skin: Secondary | ICD-10-CM | POA: Diagnosis not present

## 2015-09-21 DIAGNOSIS — I87333 Chronic venous hypertension (idiopathic) with ulcer and inflammation of bilateral lower extremity: Secondary | ICD-10-CM | POA: Diagnosis not present

## 2015-09-24 ENCOUNTER — Other Ambulatory Visit: Payer: Self-pay | Admitting: Internal Medicine

## 2015-09-25 ENCOUNTER — Other Ambulatory Visit: Payer: Self-pay

## 2015-09-25 MED ORDER — INSULIN GLARGINE 300 UNIT/ML ~~LOC~~ SOPN
60.0000 [IU] | PEN_INJECTOR | Freq: Two times a day (BID) | SUBCUTANEOUS | 1 refills | Status: DC
Start: 2015-09-25 — End: 2015-09-26

## 2015-09-26 ENCOUNTER — Other Ambulatory Visit: Payer: Self-pay

## 2015-09-26 MED ORDER — INSULIN GLARGINE 300 UNIT/ML ~~LOC~~ SOPN: 60.0000 [IU] | PEN_INJECTOR | Freq: Two times a day (BID) | SUBCUTANEOUS | 1 refills | Status: DC

## 2015-09-29 ENCOUNTER — Other Ambulatory Visit: Payer: Self-pay

## 2015-09-29 MED ORDER — INSULIN GLARGINE 300 UNIT/ML ~~LOC~~ SOPN: 60.0000 [IU] | PEN_INJECTOR | Freq: Two times a day (BID) | SUBCUTANEOUS | 1 refills | Status: DC

## 2015-10-09 ENCOUNTER — Other Ambulatory Visit: Payer: Self-pay | Admitting: Internal Medicine

## 2015-10-11 DIAGNOSIS — H02052 Trichiasis without entropian right lower eyelid: Secondary | ICD-10-CM | POA: Diagnosis not present

## 2015-10-11 DIAGNOSIS — H40051 Ocular hypertension, right eye: Secondary | ICD-10-CM | POA: Diagnosis not present

## 2015-10-11 DIAGNOSIS — H0289 Other specified disorders of eyelid: Secondary | ICD-10-CM | POA: Diagnosis not present

## 2015-10-11 DIAGNOSIS — H1031 Unspecified acute conjunctivitis, right eye: Secondary | ICD-10-CM | POA: Diagnosis not present

## 2015-10-19 ENCOUNTER — Ambulatory Visit (INDEPENDENT_AMBULATORY_CARE_PROVIDER_SITE_OTHER): Payer: Medicare Other | Admitting: Orthopedic Surgery

## 2015-10-19 DIAGNOSIS — L97421 Non-pressure chronic ulcer of left heel and midfoot limited to breakdown of skin: Secondary | ICD-10-CM | POA: Diagnosis not present

## 2015-10-19 DIAGNOSIS — E1142 Type 2 diabetes mellitus with diabetic polyneuropathy: Secondary | ICD-10-CM | POA: Diagnosis not present

## 2015-10-19 DIAGNOSIS — I87333 Chronic venous hypertension (idiopathic) with ulcer and inflammation of bilateral lower extremity: Secondary | ICD-10-CM | POA: Diagnosis not present

## 2015-11-02 DIAGNOSIS — H02052 Trichiasis without entropian right lower eyelid: Secondary | ICD-10-CM | POA: Diagnosis not present

## 2015-11-16 ENCOUNTER — Other Ambulatory Visit (INDEPENDENT_AMBULATORY_CARE_PROVIDER_SITE_OTHER): Payer: Self-pay | Admitting: Family

## 2015-11-16 ENCOUNTER — Encounter (INDEPENDENT_AMBULATORY_CARE_PROVIDER_SITE_OTHER): Payer: Self-pay | Admitting: Orthopedic Surgery

## 2015-11-16 ENCOUNTER — Ambulatory Visit (INDEPENDENT_AMBULATORY_CARE_PROVIDER_SITE_OTHER): Payer: Medicare Other | Admitting: Orthopedic Surgery

## 2015-11-16 VITALS — Ht 74.0 in | Wt 300.0 lb

## 2015-11-16 DIAGNOSIS — Z794 Long term (current) use of insulin: Secondary | ICD-10-CM

## 2015-11-16 DIAGNOSIS — M25372 Other instability, left ankle: Secondary | ICD-10-CM | POA: Insufficient documentation

## 2015-11-16 DIAGNOSIS — L97421 Non-pressure chronic ulcer of left heel and midfoot limited to breakdown of skin: Secondary | ICD-10-CM

## 2015-11-16 DIAGNOSIS — E1142 Type 2 diabetes mellitus with diabetic polyneuropathy: Secondary | ICD-10-CM

## 2015-11-16 MED ORDER — MUPIROCIN 2 % EX OINT: 1.0000 "application " | TOPICAL_OINTMENT | Freq: Two times a day (BID) | CUTANEOUS | 6 refills | Status: DC

## 2015-11-16 NOTE — Progress Notes (Addendum)
Office Visit Note   Patient: [name]           Date of Birth: 01/07/1951           MRN: [id number] Visit Date: 11/16/2015              Requested by: [name], MD [place], [place] [address] PCP: Viviana Simpler, MD   Assessment & Plan: Visit Diagnoses:  1. Ankle instability, left   2. Left midfoot ulcer limited to breakdown of skin ([place])   3. Controlled type 2 diabetes mellitus with diabetic polyneuropathy, with long-term current use of insulin ([facility])     Plan: Continue daily wound cleansing followed by mupirocin dressing changes. Have provided him with a felt pressure relieving donut. To wear this daily and minimize weight bearing. Have provided him with an order to [employer] for new custom orthotics and extra-depth shoes and a double upright brace for the left.   Follow-Up Instructions: Return in about 4 weeks (around 12/14/2015).   Orders:  No orders of the defined types were placed in this encounter.  No orders of the defined types were placed in this encounter.     Procedures: No procedures performed   Clinical Data: No additional findings.   Subjective: Chief Complaint  Patient presents with  . Left Foot - Follow-up    Patient returns today as a recheck on left foot. Patient states he is doing dressing changes daily. Minimal drainage. Doing better.     Review of Systems  Constitutional: Negative for chills and fever.  Skin: Positive for wound.     Objective: Vital Signs: Ht 6\' 2"  (1.88 m)   Wt 300 lb (136.1 kg)   BMI 38.52 kg/m   Physical Exam Physical Exam  Constitutional: He is oriented to person, place, and time. He appears well-developed and well-nourished.  Pulmonary/Chest: Effort normal.  Neurological: He is alert and oriented to person, place, and time.  Psychiatric: He has a normal mood and affect.  Nursing note reviewed.  He is ambulating in a fracture boot and walker with an antalgic gait.  He has a  stable Charcot rockerbottom deformity.  There is no redness, no cellulitis, no signs of any active Charcot process or infection.   The plantar ulcer is now 2 cm in diameter and is 2 mm deep. This does have bleeding hypergranulation tissue. No hypergranulation tissue today. Surrounding callus and nonviable tissue was debrided with a 10 blade knife.  No purulence or cellulitis. Ortho Exam  Specialty Comments:  No specialty comments available.  Imaging: No results found.   PMFS History: Patient Active Problem List   Diagnosis Date Noted  . Ankle instability, left 11/16/2015  . Left midfoot ulcer limited to breakdown of skin ([place]) 11/16/2015  . Controlled type 2 diabetes mellitus with diabetic polyneuropathy, with long-term current use of insulin ([place]) 03/29/2015  . Type 2 diabetes mellitus with diabetic polyneuropathy, with long-term current use of insulin ([place]) 03/29/2015  . Advance directive discussed with patient 02/07/2014  . Diabetic foot ulcer ([place]) 11/03/2013  . Normocytic anemia 04/01/2013  . Diabetic Charcot foot ([place]) 04/01/2013  . Status post amputation of toe of left foot ([place]) 01/19/2013  . BMI 40.0-44.9, adult ([place]) 12/30/2012  . Routine general medical examination at a health care facility 12/25/2010  . HEMORRHOIDS-INTERNAL 11/22/2009  . PERSONAL HX COLONIC POLYPS 11/22/2009  . VENOUS INSUFFICIENCY 06/29/2008  . Osteoarthritis, multiple sites 04/03/2007  . ERECTILE DYSFUNCTION, ORGANIC 06/13/2006  .  Hyperlipemia 06/11/2006  . GLAUCOMA 06/11/2006  . Coronary atherosclerosis of native coronary artery 06/11/2006  . SLEEP APNEA 06/11/2006   Past Medical History:  Diagnosis Date  . Coronary atherosclerosis of unspecified type of vessel, native or graft   . Hemorrhage of rectum and anus   . Impotence of organic origin   . Internal hemorrhoids without mention of complication   . Malignant neoplasm of prostate ([place])   . Mononeuritis of unspecified site   .  Osteoarthrosis, unspecified whether generalized or localized, unspecified site   . Other and unspecified hyperlipidemia   . Personal history of colonic polyps   . Personal history of diabetic foot ulcer    saw wound center, resolved 05/08/2010  . Personal history of gallstones   . Routine general medical examination at a health care facility   . Type II or unspecified type diabetes mellitus with neurological manifestations, not stated as uncontrolled(250.60)   . Unspecified glaucoma(365.9)   . Unspecified sleep apnea    cpcp  . Unspecified venous (peripheral) insufficiency     Family History  Problem Relation Age of Onset  . Lung cancer Father   . Multiple sclerosis Mother   . Heart attack      paternal aunts and uncles  . Peripheral vascular disease Maternal Grandfather     several amputations    Past Surgical History:  Procedure Laterality Date  . AMPUTATION Left 12/30/2012   Procedure: AMPUTATION RAY ;  Surgeon: Meredith Pel, MD;  Location: [facility];  Service: Orthopedics;  Laterality: Left;  LEFT GREAT TOE RAY AMPUTATION  . BASAL CELL CARCINOMA EXCISION  2/16   left forearm  . CARDIAC CATHETERIZATION  1998   Negative  . CORONARY ARTERY BYPASS GRAFT  09/2005   Post op AFIB  . EYE SURGERY    . FOOT BONE EXCISION Left 11/03/2013   DR DUDA   . I&D EXTREMITY Left 11/03/2013   Procedure: Left Foot Partial Bone Excision Cuboid and Medial Cuneiform, Wound Closures;  Surgeon: [name], MD;  Location: [place];  Service: Orthopedics;  Laterality: Left;  . INSERTION PROSTATE RADIATION SEED  2009   RT and seeds for prostate cancer  . KIDNEY STONE SURGERY  04/1993  . RCA stents  04/2003   EF 55%  . RETINAL DETACHMENT SURGERY  2002-2003  . thrombosed vein  1993   Right leg   Social History   Occupational History  . Heavy equipment mechanic--retired    Social History Main Topics  . Smoking status: Former Smoker    Packs/day: 2.00    Years: 35.00    Types: Cigars,  Cigarettes    Quit date: 01/07/2001  . Smokeless tobacco: Never Used  . Alcohol use Yes     Comment: rare  . Drug use: No  . Sexual activity: Yes    Partners: Female

## 2015-11-20 DIAGNOSIS — H2512 Age-related nuclear cataract, left eye: Secondary | ICD-10-CM | POA: Diagnosis not present

## 2015-11-20 DIAGNOSIS — H43391 Other vitreous opacities, right eye: Secondary | ICD-10-CM | POA: Diagnosis not present

## 2015-11-20 DIAGNOSIS — H33011 Retinal detachment with single break, right eye: Secondary | ICD-10-CM | POA: Diagnosis not present

## 2015-11-20 DIAGNOSIS — H4061X0 Glaucoma secondary to drugs, right eye, stage unspecified: Secondary | ICD-10-CM | POA: Diagnosis not present

## 2015-11-22 ENCOUNTER — Other Ambulatory Visit: Payer: Self-pay | Admitting: Internal Medicine

## 2015-12-04 ENCOUNTER — Other Ambulatory Visit: Payer: Self-pay | Admitting: Internal Medicine

## 2015-12-04 ENCOUNTER — Ambulatory Visit (INDEPENDENT_AMBULATORY_CARE_PROVIDER_SITE_OTHER): Payer: Medicare Other | Admitting: Internal Medicine

## 2015-12-04 ENCOUNTER — Encounter: Payer: Self-pay | Admitting: Internal Medicine

## 2015-12-04 VITALS — BP 140/90 | HR 90 | Wt 311.0 lb

## 2015-12-04 DIAGNOSIS — Z23 Encounter for immunization: Secondary | ICD-10-CM

## 2015-12-04 DIAGNOSIS — E1142 Type 2 diabetes mellitus with diabetic polyneuropathy: Secondary | ICD-10-CM

## 2015-12-04 DIAGNOSIS — Z794 Long term (current) use of insulin: Secondary | ICD-10-CM | POA: Diagnosis not present

## 2015-12-04 LAB — POCT GLYCOSYLATED HEMOGLOBIN (HGB A1C): HEMOGLOBIN A1C: 7.7

## 2015-12-04 NOTE — Progress Notes (Signed)
Patient ID: JIAYI KOBERSTEIN, male   DOB: 24-Feb-1950, 65 y.o.   MRN: [id number]  HPI: MOLLY ARMBRECHT is a 65 y.o.-year-old male, returning for f/u for DM2, dx 2008, insulin-dependent since dx, uncontrolled, with complications (peripheral neuropathy, CAD-status post CABG in 2008, PVD, Charcot foot, history of diabetic foot ulcer, history of retinal detachment in 2002-3). Last visit 3 mo ago. 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   He had 3 kidney stones in 3 weeks. He tells me last year he had 12!  Last hemoglobin A1c was: Lab Results  Component Value Date   HGBA1C 8.0 08/31/2015   HGBA1C 7.5 03/29/2015   HGBA1C 7.9 12/29/2014   He did not follow my insulin dose instructions after last visit (he forgot we changed the doses!): Pt is on a regimen of: - Metformin 2000 mg with dinner -  (still taking 50 units 2x a day) - Humalog at mealtime as follows:   (still taking 30 units)(still taking 35 units)   (not taking) We stopped Glipizide 5 mg.  Pt checks his sugars 1-3x a day and they are higher - reviewed sugar log: - am: 116-221, 309 >> 173-255 >> 132, 187-220 >> 152-193, 210, 257 >> 148-196, 208 >> 131, 142-240 (Thanksgiving) - before lunch: 120-150 >> n/c >> 215-245 >> n/c >> 134, 170 >> 187 >> 138-201 >> n/c >> 146-240 - before dinner: 129-179 >> 92, 108-164, 193 >> 150-211, 227 >> 130--177, 197 >> 148-256 >> n/c - bedtime: 112-254 - last 3 weeks >> n/c >> 244 >> n/c >> 194-310 >> 117, 175-236 >> n/c No lows. Lowest: 131 Highest sugar:240  Pt's meals are: - Breakfast: bacon, eggs, tomato sandwich (sometimes no bread) - Lunch: meat + 2 veggies (no potatoes) - Dinner: meat + 2 veggies - Snacks: 1 a day - 2x a week, icecream  Uses Splenda in tea and coffee.  - + mild CKD, last BUN/creatinine:  Lab Results  Component Value Date   BUN 20 02/13/2015   CREATININE 1.13 02/13/2015  On Losartan. - last set of lipids: Lab Results  Component  Value Date   CHOL 121 02/13/2015   HDL 28.20 (L) 02/13/2015   LDLCALC 73 01/19/2013   LDLDIRECT 72.0 02/13/2015   TRIG 205.0 (H) 02/13/2015   CHOLHDL 4 02/13/2015  Not on a statin as intolerant. - last eye exam was 08/09/2014. No DR. Dr Claudean Kinds. He had a detached retina in 03/2015 >> had Laser sx (his 3rd). He had cataract sx (R) 06/2015. - + numbness and tingling in his feet. No sensation in his feet.   He had amputation of his big toe in 12/30/2012 2/2 infection >> osteomyelitis.  He was hospitalized for foot ulcer (bottom of L foot) in 03/2013 >> boot. MRI: no Osteomyelitis, had gas gangrene and abscesses >> debrided and tx with ABx. He is still in a both now, for stability. He will have a special Chestnut designed with leg support.  He had 3 foot ulcers >> osteomyelitis >> had L partial bone excision 11/03/2013 - Dr [name]. He has a h/o Charcot foot.    He also has a history of Prostate cancer, s/p 25 RxTx, then radioactive seeds. Also, hyperlipidemia, hypertension, sleep apnea, osteoarthrosis.   ROS: Constitutional: no weight loss/gain, no fatigue, no subjective hyperthermia/hypothermia Eyes: no blurry vision, no xerophthalmia ENT: no sore throat, no nodules palpated in throat, no dysphagia/odynophagia, no hoarseness Cardiovascular: no CP/SOB/palpitations/no leg swelling Respiratory:  no cough/SOB Gastrointestinal: no N/V/D/no C Musculoskeletal: no muscle/no joint aches; + Charcot foot Skin: no rashes, + ulcer L foot sole - better Neurological: no tremors/numbness/tingling/dizziness  I reviewed pt's medications, allergies, PMH, social hx, family hx, and changes were documented in the history of present illness. Otherwise, unchanged from my initial visit note.  PE: BP 140/90   Pulse 90   Wt (!) 311 lb (141.1 kg)   SpO2 98%   BMI 39.93 kg/m  Body mass index is 39.93 kg/m.  Wt Readings from Last 3 Encounters:  12/04/15 (!) 311 lb (141.1 kg)  11/16/15 300 lb (136.1 kg)   08/31/15 (!) 312 lb (141.5 kg)   Constitutional: obese, in NAD, ruddy complexion Eyes: PERRLA, EOMI, no exophthalmos ENT: moist mucous membranes, no thyromegaly, no cervical lymphadenopathy; large neck Cardiovascular: RRR, No MRG, + bilateral leg edema, pitting, L foot in boot - walks with walker Respiratory: CTA B Gastrointestinal: abdomen soft, NT, ND, BS+ Musculoskeletal: no deformities, strength intact in all 4 Skin: moist, warm, stasis dermatitis in bilateral legs  ASSESSMENT: 1. DM2, insulin-dependent, uncontrolled, with complications - peripheral neuropathy - CAD-status post stent in 2005, then 5v CABG in 2008 - PVD - history of diabetic foot ulcer - Charcot foot - L - h/o amputation of his big toe in 12/30/2012 2/2 infection >> osteomyelitis. - history of retinal detachment in 2002-3  PLAN:  1. Patient uncontrolled DM2 on basal -bolus insulin regimen + metformin. Sugars are worse as he did not increase the insulin as I advised him last time >> will need to increase insulin now.  May need U500 insulin soon.  - I advised him to:  Patient Instructions  Please continue: - Metformin 2000 mg with dinner  Please increase:  - Toujeo 60 units 2x a day - Humalog at mealtime as follows:   35 units with a smaller meal  40 units with a larger meal or dinner  10 units with snack after dinner  Please return in 3 months with your sugar log.    - continue checking sugars at different times of the day - check 3 times a day, rotating checks - again advised him to also check some sugars before dinner and at bedtime - advised for yearly eye exams >> up to date - check Hba1c today >> 7.7% (slightly better!) - Return to clinic in 3 months with sugar log   Philemon Kingdom, MD PhD [facility] Endocrinology

## 2015-12-04 NOTE — Patient Instructions (Signed)
Please continue: - Metformin 2000 mg with dinner  Please increase:  - Toujeo 60 units 2x a day - Humalog at mealtime as follows:   35 units with a smaller meal  40 units with a larger meal or dinner  10 units with snack after dinner  Please return in 3 months with your sugar log.

## 2015-12-13 ENCOUNTER — Other Ambulatory Visit: Payer: Self-pay | Admitting: Internal Medicine

## 2015-12-13 DIAGNOSIS — N200 Calculus of kidney: Secondary | ICD-10-CM | POA: Diagnosis not present

## 2015-12-14 ENCOUNTER — Encounter (INDEPENDENT_AMBULATORY_CARE_PROVIDER_SITE_OTHER): Payer: Self-pay | Admitting: Orthopedic Surgery

## 2015-12-14 ENCOUNTER — Ambulatory Visit (INDEPENDENT_AMBULATORY_CARE_PROVIDER_SITE_OTHER): Payer: Medicare Other | Admitting: Family

## 2015-12-14 DIAGNOSIS — E1142 Type 2 diabetes mellitus with diabetic polyneuropathy: Secondary | ICD-10-CM | POA: Diagnosis not present

## 2015-12-14 DIAGNOSIS — E11610 Type 2 diabetes mellitus with diabetic neuropathic arthropathy: Secondary | ICD-10-CM | POA: Diagnosis not present

## 2015-12-14 DIAGNOSIS — Z794 Long term (current) use of insulin: Secondary | ICD-10-CM

## 2015-12-14 DIAGNOSIS — L97421 Non-pressure chronic ulcer of left heel and midfoot limited to breakdown of skin: Secondary | ICD-10-CM

## 2015-12-14 NOTE — Progress Notes (Signed)
Wound Care Note   Patient: [name]           Date of Birth: 1950-08-03           MRN: [id number]             PCP: Viviana Simpler, MD Visit Date: 12/14/2015   Assessment & Plan: Visit Diagnoses:  1. Diabetic Charcot foot ([place])   2. Skin ulcer of midfoot region, left, limited to breakdown of skin ([place])   3. Type 2 diabetes mellitus with diabetic polyneuropathy, with long-term current use of insulin ([facility])     Plan: Have provided him with a order for a custom orthotic with extra-depth shoes and a double upright brace on the left. Will follow with biotech for fabrication. Follow-up in office with [place] in 4 more weeks. Continue with bactroban dressing changes daily. Minimize weight bearing on the left.   Follow-Up Instructions: Return in about 4 weeks (around 01/11/2016).  Orders:  No orders of the defined types were placed in this encounter.  No orders of the defined types were placed in this encounter.     Procedures: No notes on file   Clinical Data: No additional findings.   No images are attached to the encounter.   Subjective: Chief Complaint  Patient presents with  . Left Foot - Wound Check    Patient is a 65 year old gentleman who presents for follow up left midfoot ulceration. He is doing dial soap cleansing and bactroban dressing changes daily. He is protected weightbearing with a fracture boot. Has been provided with pressure relieving donuts multiple times. Patient states these worsen the ulceration and has discontinued use. His wife accompanies him today.   Wound Check     Review of Systems  Constitutional: Negative for chills and fever.      Objective: Vital Signs: There were no vitals taken for this visit.  Physical Exam: Patient is alert and oriented. No adenopathy. Well-dressed. Normal affect. Respirations easy.  Charcot rocker deformity to the left foot. There is an ulceration this measures 15 mm in diameter and probes 3 mm deep. There is no  drainage no surrounding erythema maceration. No cellulitis.  Specialty Comments: No specialty comments available.   PMFS History: Patient Active Problem List   Diagnosis Date Noted  . Ankle instability, left 11/16/2015  . Left midfoot ulcer limited to breakdown of skin ([place]) 11/16/2015  . Controlled type 2 diabetes mellitus with diabetic polyneuropathy, with long-term current use of insulin ([place]) 03/29/2015  . Type 2 diabetes mellitus with diabetic polyneuropathy, with long-term current use of insulin ([place]) 03/29/2015  . Advance directive discussed with patient 02/07/2014  . Diabetic foot ulcer ([place]) 11/03/2013  . Normocytic anemia 04/01/2013  . Diabetic Charcot foot ([place]) 04/01/2013  . Status post amputation of toe of left foot ([place]) 01/19/2013  . BMI 40.0-44.9, adult ([place]) 12/30/2012  . Routine general medical examination at a health care facility 12/25/2010  . HEMORRHOIDS-INTERNAL 11/22/2009  . PERSONAL HX COLONIC POLYPS 11/22/2009  . VENOUS INSUFFICIENCY 06/29/2008  . Osteoarthritis, multiple sites 04/03/2007  . ERECTILE DYSFUNCTION, ORGANIC 06/13/2006  . Hyperlipemia 06/11/2006  . GLAUCOMA 06/11/2006  . Coronary atherosclerosis of native coronary artery 06/11/2006  . SLEEP APNEA 06/11/2006   Past Medical History:  Diagnosis Date  . Coronary atherosclerosis of unspecified type of vessel, native or graft   . Hemorrhage of rectum and anus   . Impotence of organic origin   . Internal hemorrhoids without mention of complication   .  Malignant neoplasm of prostate ([place])   . Mononeuritis of unspecified site   . Osteoarthrosis, unspecified whether generalized or localized, unspecified site   . Other and unspecified hyperlipidemia   . Personal history of colonic polyps   . Personal history of diabetic foot ulcer    saw wound center, resolved 05/08/2010  . Personal history of gallstones   . Routine general medical examination at a health care facility   . Type II or unspecified  type diabetes mellitus with neurological manifestations, not stated as uncontrolled(250.60)   . Unspecified glaucoma(365.9)   . Unspecified sleep apnea    cpcp  . Unspecified venous (peripheral) insufficiency     Family History  Problem Relation Age of Onset  . Lung cancer Father   . Multiple sclerosis Mother   . Heart attack      paternal aunts and uncles  . Peripheral vascular disease Maternal Grandfather     several amputations   Past Surgical History:  Procedure Laterality Date  . AMPUTATION Left 12/30/2012   Procedure: AMPUTATION RAY ;  Surgeon: Meredith Pel, MD;  Location: [facility];  Service: Orthopedics;  Laterality: Left;  LEFT GREAT TOE RAY AMPUTATION  . BASAL CELL CARCINOMA EXCISION  2/16   left forearm  . CARDIAC CATHETERIZATION  1998   Negative  . CORONARY ARTERY BYPASS GRAFT  09/2005   Post op AFIB  . EYE SURGERY    . FOOT BONE EXCISION Left 11/03/2013   DR DUDA   . I&D EXTREMITY Left 11/03/2013   Procedure: Left Foot Partial Bone Excision Cuboid and Medial Cuneiform, Wound Closures;  Surgeon: [name], MD;  Location: [facility];  Service: Orthopedics;  Laterality: Left;  . INSERTION PROSTATE RADIATION SEED  2009   RT and seeds for prostate cancer  . KIDNEY STONE SURGERY  04/1993  . RCA stents  04/2003   EF 55%  . RETINAL DETACHMENT SURGERY  2002-2003  . thrombosed vein  1993   Right leg   Social History   Occupational History  . Heavy equipment mechanic--retired    Social History Main Topics  . Smoking status: Former Smoker    Packs/day: 2.00    Years: 35.00    Types: Cigars, Cigarettes    Quit date: 01/07/2001  . Smokeless tobacco: Never Used  . Alcohol use Yes     Comment: rare  . Drug use: No  . Sexual activity: Yes    Partners: Female

## 2015-12-18 DIAGNOSIS — H02052 Trichiasis without entropian right lower eyelid: Secondary | ICD-10-CM | POA: Diagnosis not present

## 2016-01-04 ENCOUNTER — Encounter (INDEPENDENT_AMBULATORY_CARE_PROVIDER_SITE_OTHER): Payer: Self-pay | Admitting: Orthopedic Surgery

## 2016-01-04 ENCOUNTER — Telehealth (INDEPENDENT_AMBULATORY_CARE_PROVIDER_SITE_OTHER): Payer: Self-pay | Admitting: *Deleted

## 2016-01-04 ENCOUNTER — Ambulatory Visit (INDEPENDENT_AMBULATORY_CARE_PROVIDER_SITE_OTHER): Payer: Medicare Other | Admitting: Orthopedic Surgery

## 2016-01-04 DIAGNOSIS — E11621 Type 2 diabetes mellitus with foot ulcer: Secondary | ICD-10-CM | POA: Diagnosis not present

## 2016-01-04 DIAGNOSIS — L97421 Non-pressure chronic ulcer of left heel and midfoot limited to breakdown of skin: Secondary | ICD-10-CM

## 2016-01-04 MED ORDER — DOXYCYCLINE HYCLATE 100 MG PO TABS: 100.0000 mg | ORAL_TABLET | Freq: Two times a day (BID) | ORAL | 0 refills | Status: DC

## 2016-01-04 NOTE — Telephone Encounter (Signed)
Pharmacy called stating he should have a RX of Doxycycline but pharmacy stated they do not have this RX

## 2016-01-04 NOTE — Telephone Encounter (Signed)
rx called into pharm and left on voice mail.

## 2016-01-04 NOTE — Progress Notes (Signed)
Office Visit Note   Patient: [name]           Date of Birth: Aug 19, 1950           MRN: [id number] Visit Date: 01/04/2016              Requested by: [name], MD [place], [place] [id number] PCP: Viviana Simpler, MD   Assessment & Plan: Visit Diagnoses:  1. Diabetic ulcer of left midfoot associated with type 2 diabetes mellitus, limited to breakdown of skin ([place])     Plan: Patient is a chronic plantar widened grade 1 ulcer in a new lateral Wagner grade 1 ulcer left foot. The lateral ulcer is debrided back to bleeding viable granulation tissue this was touched with silver nitrate. I disorder plus 4 x 4 plus Ace wrap was applied to both wounds. Patient will start doxycycline prescription was called and he will start Dial soap cleansing and Silvadene dressing changes follow-up in 2 weeks minimize weightbearing on his left foot. Patient does have new shoes and orthotics ordered and we will need to modify these at follow-up.  Follow-Up Instructions: Return in about 2 weeks (around 01/18/2016).   Orders:  No orders of the defined types were placed in this encounter.  Meds ordered this encounter  Medications  . doxycycline (VIBRA-TABS) 100 MG tablet    Sig: Take 1 tablet (100 mg total) by mouth 2 (two) times daily.    Dispense:  60 tablet    Refill:  0      Procedures: No procedures performed   Clinical Data: No additional findings.   Subjective: Chief Complaint  Patient presents with  . Left Foot - Wound Check    Patient presents today for possible left foot infection. He has three blisters that have ruptured. There is drainage, there is slight odor. They are doing bactroban dressing changes daily. He has a cold and has been running a temperature.     Review of Systems   Objective: Vital Signs: There were no vitals taken for this visit.  Physical Exam examination patient is alert oriented no adenopathy well-dressed normal affect normal  respiratory effort he does use a walker for ambulation. Examination he is a new ulcer over the lateral aspect the left midfoot. The ulcer is 10 x 20 mm and 3 mm deep this does not probe the bone there is good granulation tissue. There is no cellulitis no odor no drainage. The plantar ulcer stable this is 1 cm diameter and 5 mm deep. There is good granulation tissue this also does not probe passed soft tissue.  Ortho Exam  Specialty Comments:  No specialty comments available.  Imaging: No results found.   PMFS History: Patient Active Problem List   Diagnosis Date Noted  . Ankle instability, left 11/16/2015  . Left midfoot ulcer limited to breakdown of skin ([place]) 11/16/2015  . Controlled type 2 diabetes mellitus with diabetic polyneuropathy, with long-term current use of insulin ([place]) 03/29/2015  . Type 2 diabetes mellitus with diabetic polyneuropathy, with long-term current use of insulin ([place]) 03/29/2015  . Advance directive discussed with patient 02/07/2014  . Diabetic foot ulcer ([place]) 11/03/2013  . Normocytic anemia 04/01/2013  . Diabetic Charcot foot ([place]) 04/01/2013  . Status post amputation of toe of left foot ([place]) 01/19/2013  . BMI 40.0-44.9, adult ([place]) 12/30/2012  . Routine general medical examination at a health care facility 12/25/2010  . HEMORRHOIDS-INTERNAL 11/22/2009  . PERSONAL HX COLONIC  POLYPS 11/22/2009  . VENOUS INSUFFICIENCY 06/29/2008  . Osteoarthritis, multiple sites 04/03/2007  . ERECTILE DYSFUNCTION, ORGANIC 06/13/2006  . Hyperlipemia 06/11/2006  . GLAUCOMA 06/11/2006  . Coronary atherosclerosis of native coronary artery 06/11/2006  . SLEEP APNEA 06/11/2006   Past Medical History:  Diagnosis Date  . Coronary atherosclerosis of unspecified type of vessel, native or graft   . Hemorrhage of rectum and anus   . Impotence of organic origin   . Internal hemorrhoids without mention of complication   . Malignant neoplasm of prostate ([place])   . Mononeuritis of  unspecified site   . Osteoarthrosis, unspecified whether generalized or localized, unspecified site   . Other and unspecified hyperlipidemia   . Personal history of colonic polyps   . Personal history of diabetic foot ulcer    saw wound center, resolved 05/08/2010  . Personal history of gallstones   . Routine general medical examination at a health care facility   . Type II or unspecified type diabetes mellitus with neurological manifestations, not stated as uncontrolled(250.60)   . Unspecified glaucoma(365.9)   . Unspecified sleep apnea    cpcp  . Unspecified venous (peripheral) insufficiency     Family History  Problem Relation Age of Onset  . Lung cancer Father   . Multiple sclerosis Mother   . Heart attack      paternal aunts and uncles  . Peripheral vascular disease Maternal Grandfather     several amputations    Past Surgical History:  Procedure Laterality Date  . AMPUTATION Left 12/30/2012   Procedure: AMPUTATION RAY ;  Surgeon: Meredith Pel, MD;  [facility]: [facility];  Service: Orthopedics;  Laterality: Left;  LEFT GREAT TOE RAY AMPUTATION  . BASAL CELL CARCINOMA EXCISION  2/16   left forearm  . CARDIAC CATHETERIZATION  1998   Negative  . CORONARY ARTERY BYPASS GRAFT  09/2005   Post op AFIB  . EYE SURGERY    . FOOT BONE EXCISION Left 11/03/2013   DR DUDA   . I&D EXTREMITY Left 11/03/2013   Procedure: Left Foot Partial Bone Excision Cuboid and Medial Cuneiform, Wound Closures;  Surgeon: [name], MD;  Location: [facility];  Service: Orthopedics;  Laterality: Left;  . INSERTION PROSTATE RADIATION SEED  2009   RT and seeds for prostate cancer  . KIDNEY STONE SURGERY  04/1993  . RCA stents  04/2003   EF 55%  . RETINAL DETACHMENT SURGERY  2002-2003  . thrombosed vein  1993   Right leg   Social History   Occupational History  . Heavy equipment mechanic--retired    Social History Main Topics  . Smoking status: Former Smoker    Packs/day: 2.00    Years: 35.00     Types: Cigars, Cigarettes    Quit date: 01/07/2001  . Smokeless tobacco: Never Used  . Alcohol use Yes     Comment: rare  . Drug use: No  . Sexual activity: Yes    Partners: Female

## 2016-01-11 ENCOUNTER — Ambulatory Visit (INDEPENDENT_AMBULATORY_CARE_PROVIDER_SITE_OTHER): Payer: Medicare Other | Admitting: Orthopedic Surgery

## 2016-01-12 LAB — HM DIABETES EYE EXAM

## 2016-01-17 ENCOUNTER — Ambulatory Visit (INDEPENDENT_AMBULATORY_CARE_PROVIDER_SITE_OTHER): Payer: Medicare Other | Admitting: Family

## 2016-01-17 ENCOUNTER — Ambulatory Visit (INDEPENDENT_AMBULATORY_CARE_PROVIDER_SITE_OTHER): Payer: Medicare Other | Admitting: Orthopedic Surgery

## 2016-01-17 ENCOUNTER — Encounter (INDEPENDENT_AMBULATORY_CARE_PROVIDER_SITE_OTHER): Payer: Self-pay | Admitting: Family

## 2016-01-17 DIAGNOSIS — E11610 Type 2 diabetes mellitus with diabetic neuropathic arthropathy: Secondary | ICD-10-CM

## 2016-01-17 DIAGNOSIS — E1142 Type 2 diabetes mellitus with diabetic polyneuropathy: Secondary | ICD-10-CM

## 2016-01-17 DIAGNOSIS — L97421 Non-pressure chronic ulcer of left heel and midfoot limited to breakdown of skin: Secondary | ICD-10-CM

## 2016-01-17 DIAGNOSIS — Z794 Long term (current) use of insulin: Secondary | ICD-10-CM

## 2016-01-17 NOTE — Progress Notes (Signed)
Office Visit Note   Patient: [name]           Date of Birth: Jul 01, 1950           MRN: [id number] Visit Date: 01/17/2016              Requested by: [name], MD [place], [place] [id number] PCP: Viviana Simpler, MD  Chief Complaint  Patient presents with  . Left Foot - Wound Check    HPI: Patient is a 66 year old gentleman who presents today for follow up of left foot. He is still continuing with doxycycline 100mg  1 po bid, is completing a 30 day course. He is using silvadene dressing changes daily. There is no odor, there is callus around plantar wound bed. There is granulation tissue.   Has discontinued his fracture boot with foam padding. Is now wearing his diabetic Chill with custom orthotics bilaterally. States [employer] is currently fabricating a new orthotic for the left foot.     Assessment & Plan: Visit Diagnoses:  1. Ulcer of left heel, limited to breakdown of skin ([place])   2. Diabetic Charcot foot ([place])   3. Type 2 diabetes mellitus with diabetic polyneuropathy, with long-term current use of insulin ([place])     Plan: Follow up in 4 more weeks for recheck. Advised protected weight bearing, is to minimize weight bearing though. Continue daily wound cleansing and silvadene dressings.   Follow-Up Instructions: Return in about 4 weeks (around 02/14/2016).   Exam: Patient is alert and oriented. No adenopathy. Well-dressed. Normal affect. Respirations easy. Left foot with plantar ulceration which is 7 mm in diameter with surrounding callus, is 3 mm deep. Filled in with beefy hypergranulation tissue. This was touched with silver nitrate. No drainage or surrounding erythema or sign of infection.There is lateral ulceration that is 2 cm x 1 cm. This is filled in with pink granulation tissues some exudative tissue which was debrided with gauze. No surrounding erythema odor or sign of infection.  Ortho Exam   Imaging: No results found.  Orders:  No  orders of the defined types were placed in this encounter.  No orders of the defined types were placed in this encounter.    Procedures: No procedures performed  Clinical Data: No additional findings.  Subjective: Review of Systems  Constitutional: Negative for chills and fever.  Skin: Positive for wound.    Objective: Vital Signs: There were no vitals taken for this visit.  Specialty Comments:  No specialty comments available.  PMFS History: Patient Active Problem List   Diagnosis Date Noted  . Ankle instability, left 11/16/2015  . Left midfoot ulcer limited to breakdown of skin ([place]) 11/16/2015  . Controlled type 2 diabetes mellitus with diabetic polyneuropathy, with long-term current use of insulin ([place]) 03/29/2015  . Type 2 diabetes mellitus with diabetic polyneuropathy, with long-term current use of insulin ([place]) 03/29/2015  . Advance directive discussed with patient 02/07/2014  . Diabetic foot ulcer ([place]) 11/03/2013  . Normocytic anemia 04/01/2013  . Diabetic Charcot foot ([place]) 04/01/2013  . Status post amputation of toe of left foot ([place]) 01/19/2013  . BMI 40.0-44.9, adult ([place]) 12/30/2012  . Routine general medical examination at a health care facility 12/25/2010  . HEMORRHOIDS-INTERNAL 11/22/2009  . PERSONAL HX COLONIC POLYPS 11/22/2009  . VENOUS INSUFFICIENCY 06/29/2008  . Osteoarthritis, multiple sites 04/03/2007  . ERECTILE DYSFUNCTION, ORGANIC 06/13/2006  . Hyperlipemia 06/11/2006  . GLAUCOMA 06/11/2006  . Coronary atherosclerosis of  native coronary artery 06/11/2006  . SLEEP APNEA 06/11/2006   Past Medical History:  Diagnosis Date  . Coronary atherosclerosis of unspecified type of vessel, native or graft   . Hemorrhage of rectum and anus   . Impotence of organic origin   . Internal hemorrhoids without mention of complication   . Malignant neoplasm of prostate ([place])   . Mononeuritis of unspecified site   . Osteoarthrosis, unspecified whether  generalized or localized, unspecified site   . Other and unspecified hyperlipidemia   . Personal history of colonic polyps   . Personal history of diabetic foot ulcer    saw wound center, resolved 05/08/2010  . Personal history of gallstones   . Routine general medical examination at a health care facility   . Type II or unspecified type diabetes mellitus with neurological manifestations, not stated as uncontrolled(250.60)   . Unspecified glaucoma(365.9)   . Unspecified sleep apnea    cpcp  . Unspecified venous (peripheral) insufficiency     Family History  Problem Relation Age of Onset  . Lung cancer Father   . Multiple sclerosis Mother   . Heart attack      paternal aunts and uncles  . Peripheral vascular disease Maternal Grandfather     several amputations    Past Surgical History:  Procedure Laterality Date  . AMPUTATION Left 12/30/2012   Procedure: AMPUTATION RAY ;  Surgeon: Meredith Pel, MD;  Location: [facility];  Service: Orthopedics;  Laterality: Left;  LEFT GREAT TOE RAY AMPUTATION  . BASAL CELL CARCINOMA EXCISION  2/16   left forearm  . CARDIAC CATHETERIZATION  1998   Negative  . CORONARY ARTERY BYPASS GRAFT  09/2005   Post op AFIB  . EYE SURGERY    . FOOT BONE EXCISION Left 11/03/2013   DR DUDA   . I&D EXTREMITY Left 11/03/2013   Procedure: Left Foot Partial Bone Excision Cuboid and Medial Cuneiform, Wound Closures;  Surgeon: [name], MD;  Location: [facility];  Service: Orthopedics;  Laterality: Left;  . INSERTION PROSTATE RADIATION SEED  2009   RT and seeds for prostate cancer  . KIDNEY STONE SURGERY  04/1993  . RCA stents  04/2003   EF 55%  . RETINAL DETACHMENT SURGERY  2002-2003  . thrombosed vein  1993   Right leg   Social History   Occupational History  . Heavy equipment mechanic--retired    Social History Main Topics  . Smoking status: Former Smoker    Packs/day: 2.00    Years: 35.00    Types: Cigars, Cigarettes    Quit date: 01/07/2001  .  Smokeless tobacco: Never Used  . Alcohol use Yes     Comment: rare  . Drug use: No  . Sexual activity: Yes    Partners: Female

## 2016-02-07 ENCOUNTER — Other Ambulatory Visit: Payer: Self-pay | Admitting: Internal Medicine

## 2016-02-08 ENCOUNTER — Other Ambulatory Visit (INDEPENDENT_AMBULATORY_CARE_PROVIDER_SITE_OTHER): Payer: Self-pay | Admitting: Orthopedic Surgery

## 2016-02-12 DIAGNOSIS — H02052 Trichiasis without entropian right lower eyelid: Secondary | ICD-10-CM | POA: Diagnosis not present

## 2016-02-14 ENCOUNTER — Ambulatory Visit (INDEPENDENT_AMBULATORY_CARE_PROVIDER_SITE_OTHER): Payer: Medicare Other | Admitting: Internal Medicine

## 2016-02-14 ENCOUNTER — Ambulatory Visit (INDEPENDENT_AMBULATORY_CARE_PROVIDER_SITE_OTHER): Payer: Medicare Other | Admitting: Orthopedic Surgery

## 2016-02-14 ENCOUNTER — Encounter (INDEPENDENT_AMBULATORY_CARE_PROVIDER_SITE_OTHER): Payer: Self-pay | Admitting: Family

## 2016-02-14 ENCOUNTER — Encounter: Payer: Self-pay | Admitting: Internal Medicine

## 2016-02-14 ENCOUNTER — Ambulatory Visit (INDEPENDENT_AMBULATORY_CARE_PROVIDER_SITE_OTHER): Payer: Medicare Other | Admitting: Family

## 2016-02-14 VITALS — BP 128/82 | HR 82 | Temp 97.6°F | Ht 74.0 in | Wt 302.0 lb

## 2016-02-14 VITALS — Ht 74.0 in | Wt 311.0 lb

## 2016-02-14 DIAGNOSIS — Z23 Encounter for immunization: Secondary | ICD-10-CM | POA: Diagnosis not present

## 2016-02-14 DIAGNOSIS — E11610 Type 2 diabetes mellitus with diabetic neuropathic arthropathy: Secondary | ICD-10-CM

## 2016-02-14 DIAGNOSIS — Z794 Long term (current) use of insulin: Secondary | ICD-10-CM | POA: Diagnosis not present

## 2016-02-14 DIAGNOSIS — Z89422 Acquired absence of other left toe(s): Secondary | ICD-10-CM | POA: Diagnosis not present

## 2016-02-14 DIAGNOSIS — E1142 Type 2 diabetes mellitus with diabetic polyneuropathy: Secondary | ICD-10-CM | POA: Diagnosis not present

## 2016-02-14 DIAGNOSIS — E11621 Type 2 diabetes mellitus with foot ulcer: Secondary | ICD-10-CM

## 2016-02-14 DIAGNOSIS — Z6841 Body Mass Index (BMI) 40.0 and over, adult: Secondary | ICD-10-CM

## 2016-02-14 DIAGNOSIS — Z7189 Other specified counseling: Secondary | ICD-10-CM

## 2016-02-14 DIAGNOSIS — I2510 Atherosclerotic heart disease of native coronary artery without angina pectoris: Secondary | ICD-10-CM | POA: Diagnosis not present

## 2016-02-14 DIAGNOSIS — Z0000 Encounter for general adult medical examination without abnormal findings: Secondary | ICD-10-CM

## 2016-02-14 DIAGNOSIS — G4733 Obstructive sleep apnea (adult) (pediatric): Secondary | ICD-10-CM | POA: Diagnosis not present

## 2016-02-14 DIAGNOSIS — L97421 Non-pressure chronic ulcer of left heel and midfoot limited to breakdown of skin: Secondary | ICD-10-CM

## 2016-02-14 LAB — HEMOGLOBIN A1C: HEMOGLOBIN A1C: 6.1 % (ref 4.6–6.5)

## 2016-02-14 LAB — T4, FREE: Free T4: 0.98 ng/dL (ref 0.60–1.60)

## 2016-02-14 LAB — COMPREHENSIVE METABOLIC PANEL
ALT: 14 U/L (ref 0–53)
AST: 16 U/L (ref 0–37)
Albumin: 4.3 g/dL (ref 3.5–5.2)
Alkaline Phosphatase: 47 U/L (ref 39–117)
BILIRUBIN TOTAL: 0.8 mg/dL (ref 0.2–1.2)
BUN: 23 mg/dL (ref 6–23)
CALCIUM: 9.7 mg/dL (ref 8.4–10.5)
CO2: 27 meq/L (ref 19–32)
Chloride: 104 mEq/L (ref 96–112)
Creatinine, Ser: 1.34 mg/dL (ref 0.40–1.50)
GFR: 56.39 mL/min — AB (ref >60.00)
GLUCOSE: 100 mg/dL — AB (ref 70–99)
POTASSIUM: 4.6 meq/L (ref 3.5–5.1)
Sodium: 134 mEq/L — ABNORMAL LOW (ref 135–145)
Total Protein: 7.6 g/dL (ref 6.0–8.3)

## 2016-02-14 LAB — LIPID PANEL
CHOL/HDL RATIO: 5
Cholesterol: 125 mg/dL (ref 0–200)
HDL: 26.8 mg/dL — AB (ref >39.00)
LDL CALC: 71 mg/dL (ref 0–99)
NONHDL: 98.15
TRIGLYCERIDES: 135 mg/dL (ref 0.0–149.0)
VLDL: 27 mg/dL (ref 0.0–40.0)

## 2016-02-14 LAB — CBC WITH DIFFERENTIAL/PLATELET
BASOS ABS: 0.2 10*3/uL — AB (ref 0.0–0.1)
Basophils Relative: 1.3 % (ref 0.0–3.0)
EOS PCT: 3.4 % (ref 0.0–5.0)
Eosinophils Absolute: 0.4 10*3/uL (ref 0.0–0.7)
HEMATOCRIT: 42.9 % (ref 39.0–52.0)
Hemoglobin: 14.7 g/dL (ref 13.0–17.0)
LYMPHS PCT: 13.1 % (ref 12.0–46.0)
Lymphs Abs: 1.6 10*3/uL (ref 0.7–4.0)
MCHC: 34.2 g/dL (ref 30.0–36.0)
MCV: 84.7 fl (ref 78.0–100.0)
MONOS PCT: 8.2 % (ref 3.0–12.0)
Monocytes Absolute: 1 10*3/uL (ref 0.1–1.0)
NEUTROS ABS: 9 10*3/uL — AB (ref 1.4–7.7)
Neutrophils Relative %: 74 % (ref 43.0–77.0)
PLATELETS: 292 10*3/uL (ref 150.0–400.0)
RBC: 5.06 Mil/uL (ref 4.22–5.81)
RDW: 17 % — ABNORMAL HIGH (ref 11.5–15.5)
WBC: 12.1 10*3/uL — ABNORMAL HIGH (ref 4.0–10.5)

## 2016-02-14 LAB — HM DIABETES FOOT EXAM

## 2016-02-14 NOTE — Progress Notes (Signed)
Pre visit review using our clinic review tool, if applicable. No additional management support is needed unless otherwise documented below in the visit note. 

## 2016-02-14 NOTE — Assessment & Plan Note (Signed)
Continues with Dr [name]

## 2016-02-14 NOTE — Assessment & Plan Note (Signed)
I have personally reviewed the Medicare Annual Wellness questionnaire and have noted 1. The patient's medical and social history 2. Their use of alcohol, tobacco or illicit drugs 3. Their current medications and supplements 4. The patient's functional ability including ADL's, fall risks, home safety risks and hearing or visual             impairment. 5. Diet and physical activities 6. Evidence for depression or mood disorders  The patients weight, height, BMI and visual acuity have been recorded in the chart I have made referrals, counseling and provided education to the patient based review of the above and I have provided the pt with a written personalized care plan for preventive services.  I have provided you with a copy of your personalized plan for preventive services. Please take the time to review along with your updated medication list.  Colon due 2019 PSA continues in surveillance of prostate cancer Will update pneumovax Unable to exercise

## 2016-02-14 NOTE — Assessment & Plan Note (Signed)
Is now on very low calorie diet Asked him to review this with Dr [name]

## 2016-02-14 NOTE — Assessment & Plan Note (Signed)
That site is clean

## 2016-02-14 NOTE — Progress Notes (Signed)
Subjective:    Patient ID: [name], male    DOB: May 19, 1950, 66 y.o.   MRN: [id number]  HPI Here for Medicare wellness visit and follow up of chronic health conditions Reviewed form and advanced directives Reviewed other doctors No alcohol or tobacco Doesn't really exercise Walks with walker Lives with girlfriend who does most of the instrumental ADLs (but he can do) Vision is okay Hearing poor--no aides No falls No depression or anhedonia  He has no new concerns Just had ortho appt today Ulcer on plantar foot is slowly closing--but still deep No infection Also with small ulcer on lateral side of [name]--awaiting orthotic Rape that was ordered Amputation site is clean   Diabetes control okay Continues with [name] Started new diet-- has already lost 15# (only 550 calories per day) Monitoring sugars closely--no clinical hypoglycemia (holds if low) Recent eye exam--- 01/12/16----no retinopathy No sensation in feet--- not really painful. Has pain in hands (they feel cold)  Sees [name] regularly PSA basically undetectable No recent stones --on urocit (last October)  No chest pain No palpitations  No SOB No dizziness or syncope Occasional edema  Some left shoulder pain Rarely uses aleve  Current Outpatient Prescriptions on File Prior to Visit  Medication Sig Dispense Refill  . aspirin 325 MG tablet Take 325 mg by mouth daily.      . carvedilol (COREG) 25 MG tablet TAKE 1 TABLET TWICE DAILY 180 tablet 0  . COMBIGAN 0.2-0.5 % ophthalmic solution     . FREESTYLE INSULINX TEST test strip 1 each by Other route See admin instructions. Check blood sugar 3 times daily.    . Insulin Pen Needle (B-D UF III MINI PEN NEEDLES) 31G X 5 MM MISC USE TO INJECT INSULIN 4 TIMES DAILY AS INSTRUCTED FOR DIABETES 360 each 2  . losartan-hydrochlorothiazide (HYZAAR) 50-12.5 MG tablet Take 1 tablet by mouth daily. 90 tablet 2  . metFORMIN (GLUCOPHAGE) 1000 MG tablet TAKE 1 TABLET  TWICE DAILY 180 tablet 1  . Multiple Vitamin (MULTIVITAMIN) tablet Take 1 tablet by mouth daily.      . mupirocin ointment (BACTROBAN) 2 % Apply 1 application topically 2 (two) times daily. 22 g 6  . naproxen sodium (ANAPROX) 220 MG tablet Take 440 mg by mouth 2 (two) times daily as needed (for pain.). For pain    . NOVOLOG FLEXPEN 100 UNIT/ML FlexPen INJECT  30  TO  35 UNITS SUBCUTANEOUSLY THREE TIMES DAILY WITH MEALS 90 mL 1  . potassium citrate (UROCIT-K) 10 MEQ (1080 MG) SR tablet Take 20 mEq by mouth 3 (three) times daily.  12  . silver sulfADIAZINE (SILVADENE) 1 % cream   3  . TOUJEO SOLOSTAR 300 UNIT/ML SOPN INJECT 60 UNITS INTO THE SKIN 2 (TWO) TIMES DAILY. 24 pen 1  . triamcinolone (KENALOG) 0.025 % cream APPLY 1 APPLICATION TOPICALLY 3 (THREE) TIMES DAILY AS NEEDED. 30 g 3   No current facility-administered medications on file prior to visit.     Allergies  Allergen Reactions  . Atorvastatin Other (See Comments)    REACTION: myalgias  . Ezetimibe Other (See Comments)    Body ache  . Simvastatin Other (See Comments)    Body ache    Past Medical History:  Diagnosis Date  . Coronary atherosclerosis of unspecified type of vessel, native or graft   . Hemorrhage of rectum and anus   . Impotence of organic origin   . Internal hemorrhoids without mention of complication   .  Malignant neoplasm of prostate ([place])   . Mononeuritis of unspecified site   . Osteoarthrosis, unspecified whether generalized or localized, unspecified site   . Other and unspecified hyperlipidemia   . Personal history of colonic polyps   . Personal history of diabetic foot ulcer    saw wound center, resolved 05/08/2010  . Personal history of gallstones   . Routine general medical examination at a health care facility   . Type II or unspecified type diabetes mellitus with neurological manifestations, not stated as uncontrolled(250.60)   . Unspecified glaucoma(365.9)   . Unspecified sleep apnea    cpcp  .  Unspecified venous (peripheral) insufficiency     Past Surgical History:  Procedure Laterality Date  . AMPUTATION Left 12/30/2012   Procedure: AMPUTATION RAY ;  Surgeon: [name];  Location: [facility];  Service: Orthopedics;  Laterality: Left;  LEFT GREAT TOE RAY AMPUTATION  . BASAL CELL CARCINOMA EXCISION  2/16   left forearm  . CARDIAC CATHETERIZATION  1998   Negative  . CORONARY ARTERY BYPASS GRAFT  09/2005   Post op AFIB  . EYE SURGERY    . FOOT BONE EXCISION Left 11/03/2013   [name]   . I&D EXTREMITY Left 11/03/2013   Procedure: Left Foot Partial Bone Excision Cuboid and Medial Cuneiform, Wound Closures;  Surgeon: Martin Minion, [name];  Location: [facility];  Service: Orthopedics;  Laterality: Left;  . INSERTION PROSTATE RADIATION SEED  2009   RT and seeds for prostate cancer  . KIDNEY STONE SURGERY  04/1993  . RCA stents  04/2003   EF 55%  . RETINAL DETACHMENT SURGERY  2002-2003  . thrombosed vein  1993   Right leg    Family History  Problem Relation Age of Onset  . Lung cancer Father   . Multiple sclerosis Mother   . Heart attack      paternal aunts and uncles  . Peripheral vascular disease Maternal Grandfather     several amputations    Social History   Social History  . Marital status: Widowed    Spouse name: N/A  . Number of children: 3  . Years of education: N/A   Occupational History  . Heavy equipment mechanic--retired    Social History Main Topics  . Smoking status: Former Smoker    Packs/day: 2.00    Years: 35.00    Types: Cigars, Cigarettes    Quit date: 01/07/2001  . Smokeless tobacco: Never Used  . Alcohol use Yes     Comment: rare  . Drug use: No  . Sexual activity: Yes    Partners: Female   Other Topics Concern  . Not on file   Social History Narrative   Has living will   Daughter [name] is [facility] care POA    Would accept resuscitation attempts--but no prolonged artificial ventilation   No tube feeds if cognitively  unaware   Review of Systems Weight is stable since last year Sleeps well Wears seat belt Teeth okay---overdue for dentist Bowels are fine. No blood in stool Voids fairly well. Normal stream. Nocturia x 2 usually No heartburn or dysphagia No ulcers or skin problems other than his foot    Objective:   Physical Exam  Constitutional: He is oriented to person, place, and time. No distress.  HENT:  Mouth/Throat: Oropharynx is clear and moist. No oropharyngeal exudate.  Neck: Neck supple. No thyromegaly present.  Cardiovascular: Normal rate, regular rhythm, normal heart sounds and intact  distal pulses.  Exam reveals no gallop.   No murmur heard. Pulmonary/Chest: Effort normal and breath sounds normal. No respiratory distress. He has no wheezes. He has no rales.  Abdominal: Soft. There is no tenderness.  Musculoskeletal: He exhibits no edema or tenderness.  Lymphadenopathy:    He has no cervical adenopathy.  Neurological: He is alert and oriented to person, place, and time.  President--- "Daisy Floro, Obama, Bush" [phone] [id number] Recall 3/3  Skin:  Left great toe amputations site is clean Deep plantar ulcer on left is not inflamed Superficial ulcer lateral left foot and red area closer to ankle  Psychiatric: He has a normal mood and affect. His behavior is normal.          Assessment & Plan:

## 2016-02-14 NOTE — Assessment & Plan Note (Signed)
No chest pain On appropriate meds--carvedilol/lisinopril

## 2016-02-14 NOTE — Assessment & Plan Note (Signed)
Uses machine every night and sleeps well with it Now has new machine with autotitrate

## 2016-02-14 NOTE — Addendum Note (Signed)
Addended by: Pilar Grammes on: 02/14/2016 12:28 PM   Modules accepted: Orders

## 2016-02-14 NOTE — Assessment & Plan Note (Signed)
See social history 

## 2016-02-14 NOTE — Progress Notes (Signed)
Office Visit Note   Patient: [name]           Date of Birth: June 27, 1950           MRN: [id number] Visit Date: 02/14/2016              Requested by: [name], MD [place], [place] [id number] PCP: Viviana Simpler, MD  Chief Complaint  Patient presents with  . Left Foot - Wound Check    HPI: Patient is a 66 year old gentleman who presents today for evaluation of left foot ulcers. He is diabetic and has a Charcot foot. There is an ulcer over the rocker-bottom deformity as well as over the lateral midfoot. The midfoot ulcer is stable. They feel that the him plantar ulcer is filling in. Continues to have bloody drainage soaking through the things. Is currently in him extra-depth shoes with custom orthotics however these are old. Is working with [employer] for fabrication of new orthotics.  Continues to full weight-bear on the left foot.    Assessment & Plan: Visit Diagnoses:  1. Diabetic Charcot foot ([place])   2. Diabetic ulcer of left midfoot associated with type 2 diabetes mellitus, limited to breakdown of skin ([place])   3. Left midfoot ulcer limited to breakdown of skin ([place])     Plan: Continue with daily wound care. Daily wound cleansing. Apply a mupirocin ointment and dry dressings. They will pack the plantar ulcer open. Again stressed the importance of nonweightbearing on the left foot. Patient is aware of necessity of nonweightbearing.  Follow-Up Instructions: No Follow-up on file.   Ortho Exam Physical Exam  Constitutional: Appears well-developed.  Head: Normocephalic.  Eyes: EOM are normal.  Neck: Normal range of motion.  Cardiovascular: Normal rate.   Pulmonary/Chest: Effort normal.  Neurological: Is alert.  Skin: Skin is warm.  Psychiatric: Has a normal mood and affect. Foot: There is a him lateral midfoot ulceration this is 2 cm x 8 cm in size wound bed filled in with 50% exudative tissue 50% granulation. Under the rocker-bottom deformity  of plantar aspect but there is a 15 mm by serration this is completely filled in with granulation tissue there is surrounding maceration and callus which was part with a 10 blade knife. The wound does have about 2 mm of depth. Also probes. Probes about 12 mm deep. Does not probe to bone. Bloody drainage today. There is no purulence no surrounding erythema no odor no sign of infection. Imaging: No results found.  Orders:  No orders of the defined types were placed in this encounter.  No orders of the defined types were placed in this encounter.    Procedures: No procedures performed  Clinical Data: No additional findings.  Subjective: Review of Systems  Constitutional: Negative for chills and fever.  Skin: Positive for wound. Negative for color change.    Objective: Vital Signs: Ht 6\' 2"  (1.88 m)   Wt (!) 311 lb (141.1 kg)   BMI 39.93 kg/m   Specialty Comments:  No specialty comments available.  PMFS History: Patient Active Problem List   Diagnosis Date Noted  . Ankle instability, left 11/16/2015  . Left midfoot ulcer limited to breakdown of skin ([place]) 11/16/2015  . Controlled type 2 diabetes mellitus with diabetic polyneuropathy, with long-term current use of insulin ([place]) 03/29/2015  . Type 2 diabetes mellitus with diabetic polyneuropathy, with long-term current use of insulin ([place]) 03/29/2015  . Advance directive discussed with patient 02/07/2014  .  Diabetic foot ulcer ([place]) 11/03/2013  . Normocytic anemia 04/01/2013  . Diabetic Charcot foot ([place]) 04/01/2013  . Status post amputation of toe of left foot ([place]) 01/19/2013  . BMI 40.0-44.9, adult ([place]) 12/30/2012  . Routine general medical examination at a health care facility 12/25/2010  . HEMORRHOIDS-INTERNAL 11/22/2009  . PERSONAL HX COLONIC POLYPS 11/22/2009  . VENOUS INSUFFICIENCY 06/29/2008  . Osteoarthritis, multiple sites 04/03/2007  . ERECTILE DYSFUNCTION, ORGANIC 06/13/2006  . Hyperlipemia 06/11/2006  .  GLAUCOMA 06/11/2006  . Coronary atherosclerosis of native coronary artery 06/11/2006  . SLEEP APNEA 06/11/2006   Past Medical History:  Diagnosis Date  . Coronary atherosclerosis of unspecified type of vessel, native or graft   . Hemorrhage of rectum and anus   . Impotence of organic origin   . Internal hemorrhoids without mention of complication   . Malignant neoplasm of prostate ([place])   . Mononeuritis of unspecified site   . Osteoarthrosis, unspecified whether generalized or localized, unspecified site   . Other and unspecified hyperlipidemia   . Personal history of colonic polyps   . Personal history of diabetic foot ulcer    saw wound center, resolved 05/08/2010  . Personal history of gallstones   . Routine general medical examination at a health care facility   . Type II or unspecified type diabetes mellitus with neurological manifestations, not stated as uncontrolled(250.60)   . Unspecified glaucoma(365.9)   . Unspecified sleep apnea    cpcp  . Unspecified venous (peripheral) insufficiency     Family History  Problem Relation Age of Onset  . Lung cancer Father   . Multiple sclerosis Mother   . Heart attack      paternal aunts and uncles  . Peripheral vascular disease Maternal Grandfather     several amputations    Past Surgical History:  Procedure Laterality Date  . AMPUTATION Left 12/30/2012   Procedure: AMPUTATION RAY ;  Surgeon: Meredith Pel, MD;  [facility];  Service: Orthopedics;  Laterality: Left;  LEFT GREAT TOE RAY AMPUTATION  . BASAL CELL CARCINOMA EXCISION  2/16   left forearm  . CARDIAC CATHETERIZATION  1998   Negative  . CORONARY ARTERY BYPASS GRAFT  09/2005   Post op AFIB  . EYE SURGERY    . FOOT BONE EXCISION Left 11/03/2013   DR DUDA   . I&D EXTREMITY Left 11/03/2013   Procedure: Left Foot Partial Bone Excision Cuboid and Medial Cuneiform, Wound Closures;  Surgeon: [name], MD;  Location: [facility];  Service: Orthopedics;  Laterality:  Left;  . INSERTION PROSTATE RADIATION SEED  2009   RT and seeds for prostate cancer  . KIDNEY STONE SURGERY  04/1993  . RCA stents  04/2003   EF 55%  . RETINAL DETACHMENT SURGERY  2002-2003  . thrombosed vein  1993   Right leg   Social History   Occupational History  . Heavy equipment mechanic--retired    Social History Main Topics  . Smoking status: Former Smoker    Packs/day: 2.00    Years: 35.00    Types: Cigars, Cigarettes    Quit date: 01/07/2001  . Smokeless tobacco: Never Used  . Alcohol use Yes     Comment: rare  . Drug use: No  . Sexual activity: Yes    Partners: Female

## 2016-02-19 ENCOUNTER — Other Ambulatory Visit: Payer: Self-pay | Admitting: Internal Medicine

## 2016-02-20 ENCOUNTER — Encounter (INDEPENDENT_AMBULATORY_CARE_PROVIDER_SITE_OTHER): Payer: Self-pay | Admitting: Orthopedic Surgery

## 2016-02-20 ENCOUNTER — Ambulatory Visit (INDEPENDENT_AMBULATORY_CARE_PROVIDER_SITE_OTHER): Payer: Medicare Other

## 2016-02-20 ENCOUNTER — Ambulatory Visit (INDEPENDENT_AMBULATORY_CARE_PROVIDER_SITE_OTHER): Payer: Medicare Other | Admitting: Orthopedic Surgery

## 2016-02-20 VITALS — Ht 74.0 in | Wt 302.0 lb

## 2016-02-20 DIAGNOSIS — M79672 Pain in left foot: Secondary | ICD-10-CM

## 2016-02-20 DIAGNOSIS — M86271 Subacute osteomyelitis, right ankle and foot: Secondary | ICD-10-CM

## 2016-02-20 DIAGNOSIS — I2510 Atherosclerotic heart disease of native coronary artery without angina pectoris: Secondary | ICD-10-CM | POA: Diagnosis not present

## 2016-02-20 NOTE — Progress Notes (Signed)
Office Visit Note   Patient: [name]           Date of Birth: 07/04/1950           MRN: [id number] Visit Date: 02/20/2016              Requested by: Martin Carbon, MD [name], [name] [id number] PCP: Martin Simpler, MD  Chief Complaint  Patient presents with  . Left Foot - Wound Check    HPI: Patient is a 66 year old gentleman who presents today for evaluation of left foot ulcers. There is an ulcer over the rocker-bottom deformity as well as over the lateral midfoot. Patients wife complains of increase in drainage. Denies fever, chills, sweats, or nausea. Patient's wife states the plantar wound probes about half a finger length in depth. They are requesting xray imaging today. Bactroban dressing changes daily. He has been on oral antibiotic regime of doxycycline the past couple days. Patient noncompliant with nonweightbearing, he has reduced activity but still ambulates with walker full weightbearing to exam room. [name], RT   Patient has been complaining of increasing pain in his foot and his wife states that the ulcer probes to bone. Assessment & Plan: Visit Diagnoses:  1. Pain in left foot   2. Subacute osteomyelitis, right ankle and foot ([place])     Plan: Due to the osteomyelitis of the Charcot rocker-bottom deformity with Wagner grade 3 ulceration and ulceration of the fifth metatarsal head as well as ulceration of fifth metatarsal base I do not feel the patient has any foot reconstruction options. We will plan for transtibial amputation.  Follow-Up Instructions: Return in about 1 week (around 02/27/2016).   Ortho Exam Examination patient is alert oriented no adenopathy well-dressed normal affect normal respiratory effort he does have an antalgic gait he has a palpable pulse is a Charcot rocker-bottom deformity status post first ray amputation. Examination the ulcer on the Charcot rocker-bottom deformity probes about 2 cm deep and probes to  bone. There is no purulent drainage there is no ascending cellulitis. Patient also has a ulcer with exposed bone the fifth metatarsal head and ulceration base of the fifth metatarsal. These ulcers have been chronic and have not healed.  Imaging: Xr Foot Complete Left  Result Date: 02/20/2016 3 view radiographs left foot show some lytic changes at the Charcot rocker-bottom deformity no definite osteomyelitis.   Orders:  Orders Placed This Encounter  Procedures  . XR Foot Complete Left   No orders of the defined types were placed in this encounter.    Procedures: No procedures performed  Clinical Data: No additional findings.  Subjective: Review of Systems  Objective: Vital Signs: Ht 6\' 2"  (1.88 m)   Wt (!) 302 lb (137 kg)   BMI 38.77 kg/m   Specialty Comments:  No specialty comments available.  PMFS History: Patient Active Problem List   Diagnosis Date Noted  . Left midfoot ulcer limited to breakdown of skin ([place]) 11/16/2015  . Type 2 diabetes mellitus with diabetic polyneuropathy, with long-term current use of insulin ([place]) 03/29/2015  . Advance directive discussed with patient 02/07/2014  . Diabetic foot ulcer ([place]) 11/03/2013  . Normocytic anemia 04/01/2013  . Diabetic Charcot foot ([place]) 04/01/2013  . Status post amputation of toe of left foot ([place]) 01/19/2013  . BMI 40.0-44.9, adult ([place]) 12/30/2012  . Subacute osteomyelitis, right ankle and foot ([place] [name]) 12/30/2012  . Routine general medical examination at a health care facility  12/25/2010  . HEMORRHOIDS-INTERNAL 11/22/2009  . PERSONAL HX COLONIC POLYPS 11/22/2009  . VENOUS INSUFFICIENCY 06/29/2008  . Osteoarthritis, multiple sites 04/03/2007  . ERECTILE DYSFUNCTION, ORGANIC 06/13/2006  . Hyperlipemia 06/11/2006  . GLAUCOMA 06/11/2006  . Coronary atherosclerosis of native coronary artery 06/11/2006  . Obstructive sleep apnea 06/11/2006   Past Medical History:  Diagnosis Date  . Coronary atherosclerosis of  unspecified type of vessel, native or graft   . Hemorrhage of rectum and anus   . Impotence of organic origin   . Internal hemorrhoids without mention of complication   . Malignant neoplasm of prostate ([place])   . Mononeuritis of unspecified site   . Osteoarthrosis, unspecified whether generalized or localized, unspecified site   . Other and unspecified hyperlipidemia   . Personal history of colonic polyps   . Personal history of diabetic foot ulcer    saw wound center, resolved 05/08/2010  . Personal history of gallstones   . Routine general medical examination at a health care facility   . Type II or unspecified type diabetes mellitus with neurological manifestations, not stated as uncontrolled(250.60)   . Unspecified glaucoma(365.9)   . Unspecified sleep apnea    cpcp  . Unspecified venous (peripheral) insufficiency     Family History  Problem Relation Age of Onset  . Lung cancer Father   . Multiple sclerosis Mother   . Heart attack      paternal aunts and uncles  . Peripheral vascular disease Maternal Grandfather     several amputations    Past Surgical History:  Procedure Laterality Date  . AMPUTATION Left 12/30/2012   Procedure: AMPUTATION RAY ;  Surgeon: Martin Pel, MD;  [facility];  Service: Orthopedics;  Laterality: Left;  LEFT GREAT TOE RAY AMPUTATION  . BASAL CELL CARCINOMA EXCISION  2/16   left forearm  . CARDIAC CATHETERIZATION  1998   Negative  . CATARACT EXTRACTION Right 2017   then lid surgery  . CORONARY ARTERY BYPASS GRAFT  09/2005   Post op AFIB  . EYE SURGERY    . FOOT BONE EXCISION Left 11/03/2013   [name]   . I&D EXTREMITY Left 11/03/2013   Procedure: Left Foot Partial Bone Excision Cuboid and Medial Cuneiform, Wound Closures;  Surgeon: Martin Minion, MD;  [facility];  Service: Orthopedics;  Laterality: Left;  . INSERTION PROSTATE RADIATION SEED  2009   RT and seeds for prostate cancer  . KIDNEY STONE SURGERY  04/1993  . RCA stents   04/2003   EF 55%  . RETINAL DETACHMENT SURGERY  2002-2003  . thrombosed vein  1993   Right leg   Social History   Occupational History  . Heavy equipment mechanic--retired    Social History Main Topics  . Smoking status: Former Smoker    Packs/day: 2.00    Years: 35.00    Types: Cigars, Cigarettes    Quit date: 01/07/2001  . Smokeless tobacco: Never Used  . Alcohol use Yes     Comment: rare  . Drug use: No  . Sexual activity: Yes    Partners: Female

## 2016-02-21 ENCOUNTER — Encounter (HOSPITAL_COMMUNITY): Payer: Self-pay | Admitting: *Deleted

## 2016-02-21 ENCOUNTER — Other Ambulatory Visit (INDEPENDENT_AMBULATORY_CARE_PROVIDER_SITE_OTHER): Payer: Self-pay | Admitting: Family

## 2016-02-21 NOTE — Progress Notes (Signed)
Pt denies SOB, chest pain, and being under the care of a cardiologist. Pt denies having an echo. Pt denies having a chest x ray and EKG within the last year. Pt made aware to stop taking vitamins, fish oil and herbal medications. Do not take any NSAIDs ie: Ibuprofen, Advil, Naproxen, BC and Goody Powder. Pt stated that he no longer takes Toujeo insulin and Metformin since being on a low calorie diet. Pt stated that his fasting blood glucose ranges from 75-120. Pt made aware of diabetes protocol to not take any Novolog insulin the morning of surgery, check BG every 2 hours prior to arrival to hospital and interventions for BG < 70 ( drink 4 ounces of Apple or Cranberry juice, recheck BG 15 minutes after drinking juice and phone # to call SS if BG < 70 after drinking juice ). Pt verbalized understanding of all pre-op instructions.

## 2016-02-22 ENCOUNTER — Encounter (HOSPITAL_COMMUNITY): Payer: Self-pay | Admitting: *Deleted

## 2016-02-22 MED ORDER — DEXTROSE 5 % IV SOLN
3.0000 g | INTRAVENOUS | Status: AC
  Administered 2016-02-23: 3 g via INTRAVENOUS
  Filled 2016-02-22 (×3): qty 3000

## 2016-02-23 ENCOUNTER — Inpatient Hospital Stay (HOSPITAL_COMMUNITY): Payer: Medicare Other | Admitting: Certified Registered"

## 2016-02-23 ENCOUNTER — Inpatient Hospital Stay (HOSPITAL_COMMUNITY)
Admission: RE | Admit: 2016-02-23 | Discharge: 2016-02-26 | DRG: 617 | Disposition: A | Discharge status: Home Health | Payer: Medicare Other | Source: Ambulatory Visit | Attending: Orthopedic Surgery | Admitting: Orthopedic Surgery

## 2016-02-23 ENCOUNTER — Encounter (HOSPITAL_COMMUNITY)
Admission: RE | Disposition: A | Discharge status: Home / Self Care | Payer: Self-pay | Source: Ambulatory Visit | Attending: Orthopedic Surgery

## 2016-02-23 ENCOUNTER — Encounter (HOSPITAL_COMMUNITY): Payer: Self-pay | Admitting: *Deleted

## 2016-02-23 DIAGNOSIS — Z89422 Acquired absence of other left toe(s): Secondary | ICD-10-CM | POA: Diagnosis not present

## 2016-02-23 DIAGNOSIS — I70262 Atherosclerosis of native arteries of extremities with gangrene, left leg: Secondary | ICD-10-CM | POA: Diagnosis not present

## 2016-02-23 DIAGNOSIS — Z951 Presence of aortocoronary bypass graft: Secondary | ICD-10-CM

## 2016-02-23 DIAGNOSIS — Z87891 Personal history of nicotine dependence: Secondary | ICD-10-CM

## 2016-02-23 DIAGNOSIS — E785 Hyperlipidemia, unspecified: Secondary | ICD-10-CM | POA: Diagnosis not present

## 2016-02-23 DIAGNOSIS — E11621 Type 2 diabetes mellitus with foot ulcer: Secondary | ICD-10-CM | POA: Diagnosis not present

## 2016-02-23 DIAGNOSIS — Z86010 Personal history of colonic polyps: Secondary | ICD-10-CM

## 2016-02-23 DIAGNOSIS — R262 Difficulty in walking, not elsewhere classified: Secondary | ICD-10-CM

## 2016-02-23 DIAGNOSIS — Z9841 Cataract extraction status, right eye: Secondary | ICD-10-CM | POA: Diagnosis not present

## 2016-02-23 DIAGNOSIS — I2510 Atherosclerotic heart disease of native coronary artery without angina pectoris: Secondary | ICD-10-CM | POA: Diagnosis present

## 2016-02-23 DIAGNOSIS — L97529 Non-pressure chronic ulcer of other part of left foot with unspecified severity: Secondary | ICD-10-CM | POA: Diagnosis present

## 2016-02-23 DIAGNOSIS — E1169 Type 2 diabetes mellitus with other specified complication: Principal | ICD-10-CM | POA: Diagnosis present

## 2016-02-23 DIAGNOSIS — Z87442 Personal history of urinary calculi: Secondary | ICD-10-CM | POA: Diagnosis not present

## 2016-02-23 DIAGNOSIS — M1990 Unspecified osteoarthritis, unspecified site: Secondary | ICD-10-CM | POA: Diagnosis not present

## 2016-02-23 DIAGNOSIS — Z801 Family history of malignant neoplasm of trachea, bronchus and lung: Secondary | ICD-10-CM | POA: Diagnosis not present

## 2016-02-23 DIAGNOSIS — E1149 Type 2 diabetes mellitus with other diabetic neurological complication: Secondary | ICD-10-CM | POA: Diagnosis not present

## 2016-02-23 DIAGNOSIS — M869 Osteomyelitis, unspecified: Secondary | ICD-10-CM | POA: Diagnosis present

## 2016-02-23 DIAGNOSIS — G4730 Sleep apnea, unspecified: Secondary | ICD-10-CM | POA: Diagnosis not present

## 2016-02-23 DIAGNOSIS — M86272 Subacute osteomyelitis, left ankle and foot: Secondary | ICD-10-CM

## 2016-02-23 DIAGNOSIS — Z89512 Acquired absence of left leg below knee: Secondary | ICD-10-CM

## 2016-02-23 DIAGNOSIS — Z8249 Family history of ischemic heart disease and other diseases of the circulatory system: Secondary | ICD-10-CM | POA: Diagnosis not present

## 2016-02-23 DIAGNOSIS — Z888 Allergy status to other drugs, medicaments and biological substances status: Secondary | ICD-10-CM

## 2016-02-23 DIAGNOSIS — Z820 Family history of epilepsy and other diseases of the nervous system: Secondary | ICD-10-CM

## 2016-02-23 DIAGNOSIS — Z8546 Personal history of malignant neoplasm of prostate: Secondary | ICD-10-CM

## 2016-02-23 DIAGNOSIS — Z9889 Other specified postprocedural states: Secondary | ICD-10-CM

## 2016-02-23 DIAGNOSIS — G4733 Obstructive sleep apnea (adult) (pediatric): Secondary | ICD-10-CM | POA: Diagnosis not present

## 2016-02-23 HISTORY — PX: AMPUTATION: SHX166

## 2016-02-23 HISTORY — DX: Personal history of urinary calculi: Z87.442

## 2016-02-23 HISTORY — DX: Type 2 diabetes mellitus without complications: E11.9

## 2016-02-23 LAB — GLUCOSE, CAPILLARY
GLUCOSE-CAPILLARY: 170 mg/dL — AB (ref 65–99)
Glucose-Capillary: 188 mg/dL — ABNORMAL HIGH (ref 65–99)
Glucose-Capillary: 231 mg/dL — ABNORMAL HIGH (ref 65–99)

## 2016-02-23 LAB — CBC
HCT: 39.2 % (ref 39.0–52.0)
Hemoglobin: 13.2 g/dL (ref 13.0–17.0)
MCH: 28.3 pg (ref 26.0–34.0)
MCHC: 33.7 g/dL (ref 30.0–36.0)
MCV: 84.1 fL (ref 78.0–100.0)
PLATELETS: 243 10*3/uL (ref 150–400)
RBC: 4.66 MIL/uL (ref 4.22–5.81)
RDW: 16.6 % — AB (ref 11.5–15.5)
WBC: 11.5 10*3/uL — ABNORMAL HIGH (ref 4.0–10.5)

## 2016-02-23 LAB — BASIC METABOLIC PANEL
Anion gap: 9 (ref 5–15)
BUN: 23 mg/dL — AB (ref 6–20)
CALCIUM: 9.7 mg/dL (ref 8.9–10.3)
CHLORIDE: 105 mmol/L (ref 101–111)
CO2: 24 mmol/L (ref 22–32)
CREATININE: 1.27 mg/dL — AB (ref 0.61–1.24)
GFR, EST NON AFRICAN AMERICAN: 57 mL/min — AB (ref >60)
Glucose, Bld: 186 mg/dL — ABNORMAL HIGH (ref 65–99)
Potassium: 4.1 mmol/L (ref 3.5–5.1)
Sodium: 138 mmol/L (ref 135–145)

## 2016-02-23 SURGERY — AMPUTATION BELOW KNEE
Anesthesia: General | Laterality: Left

## 2016-02-23 MED ORDER — INSULIN ASPART 100 UNIT/ML ~~LOC~~ SOLN
0.0000 [IU] | Freq: Three times a day (TID) | SUBCUTANEOUS | Status: DC
  Administered 2016-02-23 – 2016-02-24 (×2): 3 [IU] via SUBCUTANEOUS
  Administered 2016-02-24: 8 [IU] via SUBCUTANEOUS
  Administered 2016-02-24: 3 [IU] via SUBCUTANEOUS
  Administered 2016-02-25: 2 [IU] via SUBCUTANEOUS
  Administered 2016-02-25 (×2): 3 [IU] via SUBCUTANEOUS
  Administered 2016-02-26: 2 [IU] via SUBCUTANEOUS
  Administered 2016-02-26: 3 [IU] via SUBCUTANEOUS

## 2016-02-23 MED ORDER — PHENYLEPHRINE HCL 10 MG/ML IJ SOLN
INTRAMUSCULAR | Status: DC | PRN
  Administered 2016-02-23: 120 ug via INTRAVENOUS

## 2016-02-23 MED ORDER — ONDANSETRON HCL 4 MG/2ML IJ SOLN
INTRAMUSCULAR | Status: DC | PRN
  Administered 2016-02-23: 4 mg via INTRAVENOUS

## 2016-02-23 MED ORDER — LOSARTAN POTASSIUM-HCTZ 50-12.5 MG PO TABS: 1.0000 | ORAL_TABLET | Freq: Every day | ORAL | Status: DC

## 2016-02-23 MED ORDER — HYDROMORPHONE HCL 2 MG/ML IJ SOLN: 1.0000 mg | INTRAMUSCULAR | Status: DC | PRN

## 2016-02-23 MED ORDER — ASPIRIN 325 MG PO TABS
325.0000 mg | ORAL_TABLET | Freq: Every day | ORAL | Status: DC
  Administered 2016-02-24 – 2016-02-26 (×3): 325 mg via ORAL
  Filled 2016-02-23 (×3): qty 1

## 2016-02-23 MED ORDER — FENTANYL CITRATE (PF) 100 MCG/2ML IJ SOLN
INTRAMUSCULAR | Status: DC | PRN
  Administered 2016-02-23 (×2): 50 ug via INTRAVENOUS
  Administered 2016-02-23: 100 ug via INTRAVENOUS

## 2016-02-23 MED ORDER — CEFAZOLIN SODIUM-DEXTROSE 2-4 GM/100ML-% IV SOLN
2.0000 g | Freq: Four times a day (QID) | INTRAVENOUS | Status: AC
  Administered 2016-02-23 – 2016-02-24 (×3): 2 g via INTRAVENOUS
  Filled 2016-02-23 (×3): qty 100

## 2016-02-23 MED ORDER — ACETAMINOPHEN 650 MG RE SUPP: 650.0000 mg | Freq: Four times a day (QID) | RECTAL | Status: DC | PRN

## 2016-02-23 MED ORDER — 0.9 % SODIUM CHLORIDE (POUR BTL) OPTIME
TOPICAL | Status: DC | PRN
  Administered 2016-02-23: 1000 mL

## 2016-02-23 MED ORDER — MIDAZOLAM HCL 2 MG/2ML IJ SOLN
INTRAMUSCULAR | Status: AC
  Filled 2016-02-23: qty 2

## 2016-02-23 MED ORDER — SODIUM CHLORIDE 0.9 % IV SOLN: INTRAVENOUS | Status: DC

## 2016-02-23 MED ORDER — METOCLOPRAMIDE HCL 5 MG PO TABS: 5.0000 mg | ORAL_TABLET | Freq: Three times a day (TID) | ORAL | Status: DC | PRN

## 2016-02-23 MED ORDER — PHENYLEPHRINE 40 MCG/ML (10ML) SYRINGE FOR IV PUSH (FOR BLOOD PRESSURE SUPPORT)
PREFILLED_SYRINGE | INTRAVENOUS | Status: AC
  Filled 2016-02-23: qty 10

## 2016-02-23 MED ORDER — TRANEXAMIC ACID 1000 MG/10ML IV SOLN
2000.0000 mg | Freq: Once | INTRAVENOUS | Status: DC
  Filled 2016-02-23: qty 20

## 2016-02-23 MED ORDER — METHOCARBAMOL 1000 MG/10ML IJ SOLN
500.0000 mg | Freq: Four times a day (QID) | INTRAVENOUS | Status: DC | PRN
  Filled 2016-02-23: qty 5

## 2016-02-23 MED ORDER — DOCUSATE SODIUM 100 MG PO CAPS
100.0000 mg | ORAL_CAPSULE | Freq: Two times a day (BID) | ORAL | Status: DC
  Administered 2016-02-23 – 2016-02-24 (×3): 100 mg via ORAL
  Filled 2016-02-23 (×6): qty 1

## 2016-02-23 MED ORDER — CHLORHEXIDINE GLUCONATE 4 % EX LIQD: 60.0000 mL | Freq: Once | CUTANEOUS | Status: DC

## 2016-02-23 MED ORDER — HYDROCHLOROTHIAZIDE 12.5 MG PO CAPS
12.5000 mg | ORAL_CAPSULE | Freq: Every day | ORAL | Status: DC
  Administered 2016-02-23 – 2016-02-26 (×4): 12.5 mg via ORAL
  Filled 2016-02-23 (×4): qty 1

## 2016-02-23 MED ORDER — CARVEDILOL 25 MG PO TABS
25.0000 mg | ORAL_TABLET | Freq: Two times a day (BID) | ORAL | Status: DC
  Administered 2016-02-23 – 2016-02-26 (×6): 25 mg via ORAL
  Filled 2016-02-23 (×6): qty 1

## 2016-02-23 MED ORDER — BISACODYL 10 MG RE SUPP: 10.0000 mg | Freq: Every day | RECTAL | Status: DC | PRN

## 2016-02-23 MED ORDER — LIDOCAINE 2% (20 MG/ML) 5 ML SYRINGE
INTRAMUSCULAR | Status: AC
  Filled 2016-02-23: qty 5

## 2016-02-23 MED ORDER — OXYCODONE HCL 5 MG PO TABS
ORAL_TABLET | ORAL | Status: AC
  Filled 2016-02-23: qty 2

## 2016-02-23 MED ORDER — SUCCINYLCHOLINE CHLORIDE 20 MG/ML IJ SOLN
INTRAMUSCULAR | Status: DC | PRN
  Administered 2016-02-23: 100 mg via INTRAVENOUS

## 2016-02-23 MED ORDER — LACTATED RINGERS IV SOLN
INTRAVENOUS | Status: DC
  Administered 2016-02-23 (×2): via INTRAVENOUS

## 2016-02-23 MED ORDER — PROPOFOL 10 MG/ML IV BOLUS
INTRAVENOUS | Status: AC
  Filled 2016-02-23: qty 20

## 2016-02-23 MED ORDER — METFORMIN HCL 500 MG PO TABS
1000.0000 mg | ORAL_TABLET | Freq: Two times a day (BID) | ORAL | Status: DC
  Administered 2016-02-23: 1000 mg via ORAL
  Filled 2016-02-23 (×2): qty 2

## 2016-02-23 MED ORDER — INSULIN ASPART 100 UNIT/ML ~~LOC~~ SOLN
4.0000 [IU] | Freq: Three times a day (TID) | SUBCUTANEOUS | Status: DC
  Administered 2016-02-23 – 2016-02-26 (×9): 4 [IU] via SUBCUTANEOUS

## 2016-02-23 MED ORDER — INSULIN GLARGINE 100 UNIT/ML ~~LOC~~ SOLN
60.0000 [IU] | Freq: Two times a day (BID) | SUBCUTANEOUS | Status: DC
  Administered 2016-02-23 – 2016-02-26 (×6): 60 [IU] via SUBCUTANEOUS
  Filled 2016-02-23 (×6): qty 0.6

## 2016-02-23 MED ORDER — POLYETHYLENE GLYCOL 3350 17 G PO PACK: 17.0000 g | PACK | Freq: Every day | ORAL | Status: DC | PRN

## 2016-02-23 MED ORDER — MIDAZOLAM HCL 5 MG/5ML IJ SOLN
INTRAMUSCULAR | Status: DC | PRN
  Administered 2016-02-23: 2 mg via INTRAVENOUS

## 2016-02-23 MED ORDER — ONDANSETRON HCL 4 MG PO TABS: 4.0000 mg | ORAL_TABLET | Freq: Four times a day (QID) | ORAL | Status: DC | PRN

## 2016-02-23 MED ORDER — LIDOCAINE HCL (CARDIAC) 20 MG/ML IV SOLN
INTRAVENOUS | Status: DC | PRN
  Administered 2016-02-23: 100 mg via INTRAVENOUS

## 2016-02-23 MED ORDER — ONDANSETRON HCL 4 MG/2ML IJ SOLN: 4.0000 mg | Freq: Four times a day (QID) | INTRAMUSCULAR | Status: DC | PRN

## 2016-02-23 MED ORDER — HYDROMORPHONE HCL 1 MG/ML IJ SOLN: 1.0000 mg | INTRAMUSCULAR | Status: DC | PRN

## 2016-02-23 MED ORDER — POTASSIUM CITRATE ER 10 MEQ (1080 MG) PO TBCR
10.0000 meq | EXTENDED_RELEASE_TABLET | Freq: Two times a day (BID) | ORAL | Status: DC
  Administered 2016-02-23 – 2016-02-26 (×6): 10 meq via ORAL
  Filled 2016-02-23 (×6): qty 1

## 2016-02-23 MED ORDER — TRANEXAMIC ACID 1000 MG/10ML IV SOLN
INTRAVENOUS | Status: DC | PRN
  Administered 2016-02-23: 2000 mg via TOPICAL

## 2016-02-23 MED ORDER — METHOCARBAMOL 500 MG PO TABS
500.0000 mg | ORAL_TABLET | Freq: Four times a day (QID) | ORAL | Status: DC | PRN
  Administered 2016-02-23 – 2016-02-25 (×2): 500 mg via ORAL
  Filled 2016-02-23: qty 1

## 2016-02-23 MED ORDER — PROPOFOL 10 MG/ML IV BOLUS
INTRAVENOUS | Status: DC | PRN
  Administered 2016-02-23: 150 mg via INTRAVENOUS

## 2016-02-23 MED ORDER — KETOROLAC TROMETHAMINE 15 MG/ML IJ SOLN
7.5000 mg | Freq: Four times a day (QID) | INTRAMUSCULAR | Status: AC
  Administered 2016-02-23 – 2016-02-24 (×4): 7.5 mg via INTRAVENOUS
  Filled 2016-02-23 (×4): qty 1

## 2016-02-23 MED ORDER — ROCURONIUM BROMIDE 50 MG/5ML IV SOSY
PREFILLED_SYRINGE | INTRAVENOUS | Status: AC
  Filled 2016-02-23: qty 5

## 2016-02-23 MED ORDER — ACETAMINOPHEN 325 MG PO TABS: 650.0000 mg | ORAL_TABLET | Freq: Four times a day (QID) | ORAL | Status: DC | PRN

## 2016-02-23 MED ORDER — METHOCARBAMOL 500 MG PO TABS
ORAL_TABLET | ORAL | Status: AC
  Filled 2016-02-23: qty 1

## 2016-02-23 MED ORDER — LOSARTAN POTASSIUM 50 MG PO TABS
50.0000 mg | ORAL_TABLET | Freq: Every day | ORAL | Status: DC
  Administered 2016-02-23 – 2016-02-26 (×4): 50 mg via ORAL
  Filled 2016-02-23 (×4): qty 1

## 2016-02-23 MED ORDER — FENTANYL CITRATE (PF) 100 MCG/2ML IJ SOLN
INTRAMUSCULAR | Status: AC
  Filled 2016-02-23: qty 4

## 2016-02-23 MED ORDER — METOCLOPRAMIDE HCL 5 MG/ML IJ SOLN: 5.0000 mg | Freq: Three times a day (TID) | INTRAMUSCULAR | Status: DC | PRN

## 2016-02-23 MED ORDER — OXYCODONE HCL 5 MG PO TABS
5.0000 mg | ORAL_TABLET | ORAL | Status: DC | PRN
  Administered 2016-02-23: 5 mg via ORAL
  Administered 2016-02-23 – 2016-02-24 (×2): 10 mg via ORAL
  Administered 2016-02-24: 5 mg via ORAL
  Administered 2016-02-25 – 2016-02-26 (×4): 10 mg via ORAL
  Filled 2016-02-23 (×2): qty 1
  Filled 2016-02-23 (×5): qty 2

## 2016-02-23 SURGICAL SUPPLY — 33 items
BLADE SAW RECIP 87.9 MT (BLADE) ×2 IMPLANT
BLADE SURG 21 STRL SS (BLADE) ×2 IMPLANT
BNDG COHESIVE 6X5 TAN STRL LF (GAUZE/BANDAGES/DRESSINGS) IMPLANT
BNDG GAUZE ELAST 4 BULKY (GAUZE/BANDAGES/DRESSINGS) IMPLANT
COVER SURGICAL LIGHT HANDLE (MISCELLANEOUS) ×2 IMPLANT
CUFF TOURNIQUET SINGLE 34IN LL (TOURNIQUET CUFF) IMPLANT
CUFF TOURNIQUET SINGLE 44IN (TOURNIQUET CUFF) IMPLANT
DRAPE INCISE IOBAN 66X45 STRL (DRAPES) ×2 IMPLANT
DRAPE U-SHAPE 47X51 STRL (DRAPES) ×2 IMPLANT
DRSG VAC ATS MED SENSATRAC (GAUZE/BANDAGES/DRESSINGS) ×2 IMPLANT
ELECT REM PT RETURN 9FT ADLT (ELECTROSURGICAL) ×2
ELECTRODE REM PT RTRN 9FT ADLT (ELECTROSURGICAL) ×1 IMPLANT
GLOVE BIOGEL PI IND STRL 9 (GLOVE) ×1 IMPLANT
GLOVE BIOGEL PI INDICATOR 9 (GLOVE) ×1
GLOVE SURG ORTHO 9.0 STRL STRW (GLOVE) ×2 IMPLANT
GOWN STRL REUS W/ TWL XL LVL3 (GOWN DISPOSABLE) ×2 IMPLANT
GOWN STRL REUS W/TWL XL LVL3 (GOWN DISPOSABLE) ×2
KIT BASIN OR (CUSTOM PROCEDURE TRAY) ×2 IMPLANT
KIT ROOM TURNOVER OR (KITS) ×2 IMPLANT
MANIFOLD NEPTUNE II (INSTRUMENTS) ×2 IMPLANT
NS IRRIG 1000ML POUR BTL (IV SOLUTION) ×2 IMPLANT
PACK ORTHO EXTREMITY (CUSTOM PROCEDURE TRAY) ×2 IMPLANT
PAD ARMBOARD 7.5X6 YLW CONV (MISCELLANEOUS) ×2 IMPLANT
SPONGE LAP 18X18 X RAY DECT (DISPOSABLE) IMPLANT
STAPLER VISISTAT 35W (STAPLE) ×2 IMPLANT
STOCKINETTE IMPERVIOUS LG (DRAPES) ×2 IMPLANT
SUT SILK 2 0 (SUTURE) ×1
SUT SILK 2-0 18XBRD TIE 12 (SUTURE) ×1 IMPLANT
SUT VIC AB 1 CTX 27 (SUTURE) IMPLANT
TOWEL OR 17X26 10 PK STRL BLUE (TOWEL DISPOSABLE) ×2 IMPLANT
TUBE CONNECTING 12X1/4 (SUCTIONS) ×2 IMPLANT
WND VAC CANISTER 500ML (MISCELLANEOUS) ×2 IMPLANT
YANKAUER SUCT BULB TIP NO VENT (SUCTIONS) ×2 IMPLANT

## 2016-02-23 NOTE — Anesthesia Preprocedure Evaluation (Addendum)
Anesthesia Evaluation  Patient identified by MRN, date of birth, ID band Patient awake    Reviewed: Allergy & Precautions, NPO status , Patient's Chart, lab work & pertinent test results  Airway Mallampati: II  TM Distance: >3 FB Neck ROM: Full    Dental  (+) Teeth Intact, Dental Advisory Given   Pulmonary sleep apnea , former smoker,     + decreased breath sounds      Cardiovascular + CAD and + Peripheral Vascular Disease   Rhythm:Regular Rate:Normal     Neuro/Psych    GI/Hepatic   Endo/Other  diabetesMorbid obesity  Renal/GU      Musculoskeletal   Abdominal (+) + obese,   Peds  Hematology  (+) anemia ,   Anesthesia Other Findings   Reproductive/Obstetrics                            Anesthesia Physical Anesthesia Plan  ASA: III  Anesthesia Plan: General   Post-op Pain Management:    Induction: Intravenous  Airway Management Planned: Oral ETT  Additional Equipment:   Intra-op Plan:   Post-operative Plan: Extubation in OR and Possible Post-op intubation/ventilation  Informed Consent: I have reviewed the patients History and Physical, chart, labs and discussed the procedure including the risks, benefits and alternatives for the proposed anesthesia with the patient or authorized representative who has indicated his/her understanding and acceptance.   Dental advisory given  Plan Discussed with:   Anesthesia Plan Comments:         Anesthesia Quick Evaluation

## 2016-02-23 NOTE — Anesthesia Procedure Notes (Signed)
Procedure Name: LMA Insertion Date/Time: 02/23/2016 2:06 PM Performed by: Lance Coon Pre-anesthesia Checklist: Patient identified, Emergency Drugs available, Suction available, Patient being monitored and Timeout performed Patient Re-evaluated:Patient Re-evaluated prior to inductionOxygen Delivery Method: Circle system utilized Preoxygenation: Pre-oxygenation with 100% oxygen Intubation Type: IV induction Ventilation: Mask ventilation without difficulty and Oral airway inserted - appropriate to patient size Laryngoscope Size: Miller and 3 Grade View: Grade II Tube type: Oral Tube size: 7.5 mm Number of attempts: 1 Airway Equipment and Method: Stylet Placement Confirmation: ETT inserted through vocal cords under direct vision,  positive ETCO2 and breath sounds checked- equal and bilateral Secured at: 22 cm Tube secured with: Tape Dental Injury: Teeth and Oropharynx as per pre-operative assessment

## 2016-02-23 NOTE — Progress Notes (Signed)
Patient placed on CPAP for the night. RT will continue to monitor as needed. 

## 2016-02-23 NOTE — H&P (Signed)
[name] is an 66 y.o. male.   Chief Complaint: Draining ulcer Charcot rocker-bottom deformity left foot HPI: Patient is a 66 year old gentleman with diabetic insensate neuropathy who is undergone partial foot amputation and has a Charcot rocker-bottom deformity. Patient has had a recurrent plantar ulcer which has failed conservative therapy.  Past Medical History:  Diagnosis Date  . Coronary atherosclerosis of unspecified type of vessel, native or graft   . Diabetes mellitus without complication ([place])   . Hemorrhage of rectum and anus   . History of kidney stones   . Impotence of organic origin   . Internal hemorrhoids without mention of complication   . Malignant neoplasm of prostate ([place])   . Mononeuritis of unspecified site   . Osteoarthrosis, unspecified whether generalized or localized, unspecified site   . Other and unspecified hyperlipidemia   . Personal history of colonic polyps   . Personal history of diabetic foot ulcer    saw wound center, resolved 05/08/2010  . Personal history of gallstones   . Routine general medical examination at a health care facility   . Type II or unspecified type diabetes mellitus with neurological manifestations, not stated as uncontrolled(250.60)   . Unspecified glaucoma(365.9)   . Unspecified sleep apnea    cpcp  . Unspecified venous (peripheral) insufficiency     Past Surgical History:  Procedure Laterality Date  . AMPUTATION Left 12/30/2012   Procedure: AMPUTATION RAY ;  Surgeon: Meredith Pel, MD;  Location: [facility];  Service: Orthopedics;  Laterality: Left;  LEFT GREAT TOE RAY AMPUTATION  . APPENDECTOMY    . BASAL CELL CARCINOMA EXCISION  2/16   left forearm  . CARDIAC CATHETERIZATION  1998   Negative  . CATARACT EXTRACTION Right 2017   then lid surgery  . CORONARY ARTERY BYPASS GRAFT  09/2005   Post op AFIB  . EYE SURGERY    . FOOT BONE EXCISION Left 11/03/2013   DR Truett Mcfarlan   . I&D EXTREMITY Left 11/03/2013   Procedure:  Left Foot Partial Bone Excision Cuboid and Medial Cuneiform, Wound Closures;  Surgeon: [name], MD;  Location: [facility];  Service: Orthopedics;  Laterality: Left;  . INSERTION PROSTATE RADIATION SEED  2009   RT and seeds for prostate cancer  . KIDNEY STONE SURGERY  04/1993  . RCA stents  04/2003   EF 55%  . RETINAL DETACHMENT SURGERY  2002-2003  . thrombosed vein  1993   Right leg    Family History  Problem Relation Age of Onset  . Lung cancer Father   . Multiple sclerosis Mother   . Heart attack      paternal aunts and uncles  . Peripheral vascular disease Maternal Grandfather     several amputations   Social History:  reports that he quit smoking about 15 years ago. His smoking use included Cigars and Cigarettes. He has a 70.00 pack-year smoking history. He has never used smokeless tobacco. He reports that he drinks alcohol. He reports that he does not use drugs.  Allergies:  Allergies  Allergen Reactions  . Atorvastatin Other (See Comments)    REACTION: myalgias  . Ezetimibe Other (See Comments)    Body ache  . Simvastatin Other (See Comments)    Body ache    No prescriptions prior to admission.    No results found for this or any previous visit (from the past 48 hour(s)). No results found.  Review of Systems  All other systems reviewed and are  negative.   There were no vitals taken for this visit. Physical Exam  On examination patient is alert oriented no adenopathy well-dressed normal affect normal S2 effort he does have an antalgic gait he does not have protective sensation he does have a good dorsalis pedis pulse. He has a Charcot rocker-bottom deformity there is no cellulitis. The ulcer probes down to bone. There is drainage. Radiographs shows destructive Charcot arthropathy. Assessment/Plan Assessment: Diabetic insensate neuropathy with Charcot rocker-bottom deformity with Wagner grade 3 ulcer with osteomyelitis.  Plan: We'll plan for left transtibial  amputation. Risk and benefits were discussed including infection neurovascular injury nonhealing of the wound need for additional surgery. Patient states he understands wishes to proceed at this time.  [name], MD 02/23/2016, 6:29 AM

## 2016-02-23 NOTE — Op Note (Signed)
   Date of Surgery: 02/23/2016  INDICATIONS: [name] is a 66 y.o.-year-old male who has had a chronic Charcot collapse of the left foot with partial foot amputation he now has a chronic ulcer with exposed bone with osteomyelitis of the Charcot rocker-bottom deformity left foot.  PREOPERATIVE DIAGNOSIS: Diabetic insensate neuropathy Charcot collapse osteomyelitis with Wagner grade 3 ulcer left foot  POSTOPERATIVE DIAGNOSIS: Same.  PROCEDURE: Transtibial amputation Application of Prevena wound VAC Application of stump shrinker  SURGEON: [name], M.D.  ANESTHESIA:  general  IV FLUIDS AND URINE: See anesthesia.  ESTIMATED BLOOD LOSS: Minimal mL.  Topical 2 g of TXA  COMPLICATIONS: None.  DESCRIPTION OF PROCEDURE: The patient was brought to the operating room and underwent a general anesthetic. After adequate levels of anesthesia were obtained patient's lower extremity was prepped using DuraPrep draped into a sterile field. A timeout was called. The foot was draped out of the sterile field with impervious stockinette. A transverse incision was made 11 cm distal to the tibial tubercle. This curved proximally and a large posterior flap was created. The tibia was transected 1 cm proximal to the skin incision. The fibula was transected just proximal to the tibial incision. The tibia was beveled anteriorly. A large posterior flap was created. The sciatic nerve was pulled cut and allowed to retract. The vascular bundles were suture ligated with 2-0 silk. The deep and superficial fascial layers were closed using #1 Vicryl. The skin was closed using staples and 2-0 nylon. The wound was covered with a Prevena wound VAC. There was a good suction fit. A prosthetic shrinker was applied. Patient was extubated taken to the PACU in stable condition.  Meridee Score, MD [place] 2:43 PM

## 2016-02-23 NOTE — Anesthesia Postprocedure Evaluation (Addendum)
Anesthesia Post Note  Patient: [name]  Procedure(s) Performed: Procedure(s) (LRB): Left Below Knee Amputation (Left)  Patient location during evaluation: PACU Anesthesia Type: General Level of consciousness: awake and sedated Pain management: pain level controlled Vital Signs Assessment: post-procedure vital signs reviewed and stable Respiratory status: spontaneous breathing, nonlabored ventilation, respiratory function stable and patient connected to nasal cannula oxygen Cardiovascular status: blood pressure returned to baseline and stable Postop Assessment: no signs of nausea or vomiting Anesthetic complications: no       Last Vitals:  Vitals:   02/23/16 1520 02/23/16 1535  BP: (!) 114/52   Pulse: 86 86  Resp: (!) 9 18  Temp:      Last Pain:  Vitals:   02/23/16 1532  TempSrc:   PainSc: 6                  Shawnya Mayor,JAMES TERRILL

## 2016-02-23 NOTE — Transfer of Care (Signed)
Immediate Anesthesia Transfer of Care Note  Patient: [name]  Procedure(s) Performed: Procedure(s): Left Below Knee Amputation (Left)  Patient Location: PACU  Anesthesia Type:General  Level of Consciousness: awake, alert  and oriented  Airway & Oxygen Therapy: Patient Spontanous Breathing and Patient connected to face mask oxygen  Post-op Assessment: Report given to RN and Post -op Vital signs reviewed and stable  Post vital signs: Reviewed and stable  Last Vitals:  Vitals:   02/23/16 1103 02/23/16 1455  BP: (!) 119/53 (!) 119/52  Pulse: 79 89  Resp: 18 17  Temp: 36.9 C 36.1 C    Last Pain:  Vitals:   02/23/16 1455  TempSrc:   PainSc: (P) 0-No pain      Patients Stated Pain Goal: 3 ([id number])  Complications: No apparent anesthesia complications

## 2016-02-24 ENCOUNTER — Encounter (HOSPITAL_COMMUNITY): Payer: Self-pay

## 2016-02-24 LAB — GLUCOSE, CAPILLARY
GLUCOSE-CAPILLARY: 272 mg/dL — AB (ref 65–99)
GLUCOSE-CAPILLARY: 168 mg/dL — AB (ref 65–99)
GLUCOSE-CAPILLARY: 145 mg/dL — AB (ref 65–99)
Glucose-Capillary: 169 mg/dL — ABNORMAL HIGH (ref 65–99)

## 2016-02-24 NOTE — Evaluation (Signed)
Occupational Therapy Evaluation Patient Details Name: [name] MRN: [id number] DOB: June 19, 1950 Today's Date: 02/24/2016    History of Present Illness Pt is a 66 yo male admitted on 02/23/16 for left BKA due to charcot rocker foot with chronic heal ulcer and osteomyelitis. PMH significant for DM with neuropathy, prostate CA, HLD, kidney stones, glaucoma, mononeuritis, venous insufficiency.     Clinical Impression   Pt a VERY pleasant man and alert, willing to work with therapy this morning. PTA pt modified independent in ADL (has grabber/reacher) and modified independent in mobility (he used various forms of RW/rollator) Pt currently Mod A for LB ADL and min A for stand pivot transfers with RW. Pt will benefit from skilled OT in the acute setting to maximize safety and independence in ADL prior to dc to venue below. Next session to focus on LB bathing and dressing and potentially caregiver education (if present)    Follow Up Recommendations  Supervision/Assistance - 24 hour;No OT follow up (initially)    Equipment Recommendations  None recommended by OT    Recommendations for Other Services       Precautions / Restrictions Precautions Precautions: Fall Precaution Comments: new L BKA Restrictions Weight Bearing Restrictions: Yes LLE Weight Bearing: Non weight bearing      Mobility Bed Mobility Overal bed mobility: Modified Independent                Transfers Overall transfer level: Needs assistance Equipment used: Rolling walker (2 wheeled) Transfers: Stand Pivot Transfers;Sit to/from Stand Sit to Stand: Min assist Stand pivot transfers: Min assist       General transfer comment: Min A for balance    Balance Overall balance assessment: Needs assistance Sitting-balance support: Bilateral upper extremity supported;Feet supported Sitting balance-Leahy Scale: Fair Sitting balance - Comments: sitting EOB with no back support   Standing balance support:  Bilateral upper extremity supported;During functional activity Standing balance-Leahy Scale: Poor Standing balance comment: gait belt very helpful                            ADL Overall ADL's : Needs assistance/impaired Eating/Feeding: Independent;Sitting Eating/Feeding Details (indicate cue type and reason): finishing up breakfast as therapy entered the room Grooming: Wash/dry hands;Set up;Sitting   Upper Body Bathing: Set up;Sitting   Lower Body Bathing: Min guard;With adaptive equipment;Sitting/lateral leans   Upper Body Dressing : Sitting;Set up   Lower Body Dressing: Sit to/from stand;Maximal assistance Lower Body Dressing Details (indicate cue type and reason): Pt able to don/doff sock on RLE, will need assist for pants/underwear/items that require BUE Toilet Transfer: Minimal assistance;Cueing for sequencing;Stand-pivot;BSC;RW Toilet Transfer Details (indicate cue type and reason): min A for balance Toileting- Clothing Manipulation and Hygiene: Sit to/from stand;Total assistance         General ADL Comments: Pt adjusting to new balance     Vision Vision Assessment?: No apparent visual deficits   Perception     Praxis      Pertinent Vitals/Pain Pain Assessment: No/denies pain     Hand Dominance Right   Extremity/Trunk Assessment Upper Extremity Assessment Upper Extremity Assessment: Overall WFL for tasks assessed   Lower Extremity Assessment Lower Extremity Assessment: LLE deficits/detail LLE Deficits / Details: new BKA   Cervical / Trunk Assessment Cervical / Trunk Assessment: Normal   Communication Communication Communication: No difficulties   Cognition Arousal/Alertness: Awake/alert Behavior During Therapy: WFL for tasks assessed/performed Overall Cognitive Status: Within Functional Limits for tasks  assessed                     General Comments       Exercises       Shoulder Instructions      Home Living Family/patient  expects to be discharged to:: Private residence Living Arrangements: Spouse/significant other Available Help at Discharge: Family;Available 24 hours/day Type of Home: House Home Access: Ramped entrance     Home Layout: One level     Bathroom Shower/Tub: Occupational psychologist: Handicapped height Bathroom Accessibility: Yes   Home Equipment: Environmental consultant - 4 wheels;Walker - 2 wheels;Bedside commode;Shower seat;Grab bars - tub/shower;Hand held shower head;Adaptive equipment Adaptive Equipment: Reacher        Prior Functioning/Environment Level of Independence: Independent with assistive device(s)        Comments: was using RW for mobility, driving, and keeping up daily routine        OT Problem List: Impaired balance (sitting and/or standing);Decreased safety awareness;Decreased knowledge of use of DME or AE;Decreased activity tolerance   OT Treatment/Interventions: Self-care/ADL training;DME and/or AE instruction;Energy conservation;Patient/family education;Balance training    OT Goals(Current goals can be found in the care plan section) Acute Rehab OT Goals Patient Stated Goal: to be independent again OT Goal Formulation: With patient Time For Goal Achievement: 03/02/16 Potential to Achieve Goals: Good ADL Goals Pt Will Perform Lower Body Bathing: with supervision;sitting/lateral leans;with adaptive equipment;with caregiver independent in assisting Pt Will Perform Lower Body Dressing: with min guard assist;with caregiver independent in assisting;with adaptive equipment;sit to/from stand Pt Will Transfer to Toilet: with supervision;stand pivot transfer;bedside commode (with caregiver independent in assisting; with RW) Pt Will Perform Toileting - Clothing Manipulation and hygiene: with min assist;sit to/from stand;with caregiver independent in assisting;with adaptive equipment Pt Will Perform Tub/Shower Transfer: Shower transfer;with min assist;with caregiver independent  in assisting;ambulating;rolling walker;shower seat  OT Frequency: Min 2X/week   Barriers to D/C:            Co-evaluation PT/OT/SLP Co-Evaluation/Treatment: Yes Reason for Co-Treatment: For patient/therapist safety;To address functional/ADL transfers PT goals addressed during session: Mobility/safety with mobility OT goals addressed during session: ADL's and self-care      End of Session Equipment Utilized During Treatment: Gait belt;Rolling walker Nurse Communication: Mobility status;Weight bearing status;Other (comment) (in room with Pt)  Activity Tolerance: Patient tolerated treatment well Patient left: in chair;with call bell/phone within reach;with nursing/sitter in room   Time: 0821-0904 OT Time Calculation (min): 43 min Charges:  OT General Charges $OT Visit: 1 Procedure OT Evaluation $OT Eval Moderate Complexity: 1 Procedure OT Treatments $Self Care/Home Management : 8-22 mins G-Codes:    Merri Ray Makalia Bare Mar 06, 2016, 9:40 AM  Hulda Humphrey OTR/L [phone]

## 2016-02-24 NOTE — Progress Notes (Signed)
Subjective: Patient stable pain controlled wound VAC in place   Objective: Vital signs in last 24 hours: Temp:  [97 F (36.1 C)-98.6 F (37 C)] 98.6 F (37 C) (02/17 0410) Pulse Rate:  [78-89] 83 (02/17 0908) Resp:  [9-19] 16 (02/17 0033) BP: (108-145)/(49-89) 115/49 (02/17 0908) SpO2:  [95 %-97 %] 96 % (02/17 0410)  Intake/Output from previous day: 02/16 0701 - 02/17 0700 In: 1439.2 [P.O.:360; I.V.:1079.2] Out: 701 [Urine:550; Drains:100; Stool:1; Blood:50] Intake/Output this shift: No intake/output data recorded.  Exam:  Compartment soft  Labs:  Recent Labs  02/23/16 1100  HGB 13.2    Recent Labs  02/23/16 1100  WBC 11.5*  RBC 4.66  HCT 39.2  PLT 243    Recent Labs  02/23/16 1100  NA 138  K 4.1  CL 105  CO2 24  BUN 23*  CREATININE 1.27*  GLUCOSE 186*  CALCIUM 9.7   No results for input(s): LABPT, INR in the last 72 hours.  Assessment/Plan: Plan discharge for Monday or Tuesday.  Okay to be out of bed to chair   [employer] 02/24/2016, 11:12 AM

## 2016-02-24 NOTE — Evaluation (Signed)
Physical Therapy Evaluation Patient Details Name: [name] MRN: [id number] DOB: 1950/05/24 Today's Date: 02/24/2016   History of Present Illness  Pt is a 66 yo male admitted on 02/23/16 for left BKA due to charcot rocker foot with chronic heal ulcer and osteomyelitis. PMH significant for DM with neuropathy, prostate CA, HLD, kidney stones, glaucoma, mononeuritis, venous insufficiency.    Clinical Impression  Pt is POD 1 and moving well with therapy. Performed bed mobs and transfers at a Min A level this session for safety. Prior to admission, pt was independent with AD and living with his significant other in a single level home. Reports this person will be available to assist when he returns home. Pt will benefit from HHPT upon DC and a WC to improve mobility. Pt will benefit from continued PT services to address below deficits before DC to next venue of care.     Follow Up Recommendations Home health PT;Supervision for mobility/OOB    Equipment Recommendations  Wheelchair cushion (measurements PT);Wheelchair (measurements PT);Other (comment) (elevating leg rests)    Recommendations for Other Services       Precautions / Restrictions Precautions Precautions: Fall Precaution Comments: new L BKA Restrictions Weight Bearing Restrictions: Yes LLE Weight Bearing: Non weight bearing      Mobility  Bed Mobility Overal bed mobility: Modified Independent             General bed mobility comments: Able to get EOB   Transfers Overall transfer level: Needs assistance Equipment used: Rolling walker (2 wheeled) Transfers: Stand Pivot Transfers;Sit to/from Stand Sit to Stand: Min assist Stand pivot transfers: Min assist       General transfer comment: Min A for balance  Ambulation/Gait             General Gait Details: Did not attempt this session  Stairs            Wheelchair Mobility    Modified Rankin (Stroke Patients Only)       Balance Overall  balance assessment: Needs assistance Sitting-balance support: Bilateral upper extremity supported;Feet supported Sitting balance-Leahy Scale: Fair Sitting balance - Comments: sitting EOB with no back support   Standing balance support: Bilateral upper extremity supported;During functional activity Standing balance-Leahy Scale: Poor Standing balance comment: gait belt very helpful                             Pertinent Vitals/Pain Pain Assessment: No/denies pain    Home Living Family/patient expects to be discharged to:: Private residence Living Arrangements: Spouse/significant other Available Help at Discharge: Family;Available 24 hours/day Type of Home: House Home Access: Ramped entrance     Home Layout: One level Home Equipment: Walker - 4 wheels;Walker - 2 wheels;Bedside commode;Shower seat;Grab bars - tub/shower;Hand held shower head;Adaptive equipment      Prior Function Level of Independence: Independent with assistive device(s)         Comments: was using RW for mobility, driving, and keeping up daily routine     Hand Dominance   Dominant Hand: Right    Extremity/Trunk Assessment   Upper Extremity Assessment Upper Extremity Assessment: Defer to OT evaluation    Lower Extremity Assessment Lower Extremity Assessment: LLE deficits/detail LLE Deficits / Details: pt with at least 4/5 RLE and 3/5 gross mobility LLE    Cervical / Trunk Assessment Cervical / Trunk Assessment: Normal  Communication   Communication: No difficulties  Cognition Arousal/Alertness: Awake/alert Behavior During Therapy: [facility]  for tasks assessed/performed Overall Cognitive Status: Within Functional Limits for tasks assessed                      General Comments      Exercises     Assessment/Plan    PT Assessment Patient needs continued PT services  PT Problem List Decreased strength;Decreased activity tolerance;Decreased balance;Decreased mobility;Pain           PT Treatment Interventions DME instruction;Gait training;Functional mobility training;Therapeutic activities;Therapeutic exercise;Balance training;Patient/family education;Wheelchair mobility training    PT Goals (Current goals can be found in the Care Plan section)  Acute Rehab PT Goals Patient Stated Goal: to be independent again PT Goal Formulation: With patient Time For Goal Achievement: 03/02/16 Potential to Achieve Goals: Good    Frequency Min 5X/week   Barriers to discharge        Co-evaluation PT/OT/SLP Co-Evaluation/Treatment: Yes Reason for Co-Treatment: For patient/therapist safety;To address functional/ADL transfers PT goals addressed during session: Mobility/safety with mobility         End of Session Equipment Utilized During Treatment: Gait belt Activity Tolerance: Patient tolerated treatment well Patient left: in chair;with call bell/phone within reach Nurse Communication: Mobility status         Time: 0822-0904 PT Time Calculation (min) (ACUTE ONLY): 42 min   Charges:   PT Evaluation $PT Eval Moderate Complexity: 1 Procedure     PT G Codes:        Scheryl Marten PT, DPT  [phone]  02/24/2016, 12:51 PM

## 2016-02-24 NOTE — Progress Notes (Signed)
Clinical Social Work  CSW received referral to assist with SNF placement. Per chart review, PT recommends HH. CSW is signing off but available if needed.  Sindy Messing, [place] Weekend Coverage [phone]

## 2016-02-25 ENCOUNTER — Encounter (HOSPITAL_COMMUNITY): Payer: Self-pay | Admitting: *Deleted

## 2016-02-25 LAB — GLUCOSE, CAPILLARY
GLUCOSE-CAPILLARY: 138 mg/dL — AB (ref 65–99)
GLUCOSE-CAPILLARY: 199 mg/dL — AB (ref 65–99)
GLUCOSE-CAPILLARY: 203 mg/dL — AB (ref 65–99)
Glucose-Capillary: 160 mg/dL — ABNORMAL HIGH (ref 65–99)

## 2016-02-25 NOTE — Progress Notes (Signed)
Subjective: Patient stable.  Sitting in chair.  Below-knee amputation stump wrapped   Objective: Vital signs in last 24 hours: Temp:  [98.2 F (36.8 C)-99.5 F (37.5 C)] 98.5 F (36.9 C) (02/18 0637) Pulse Rate:  [78-89] 79 (02/18 1000) Resp:  [16-18] 17 (02/18 0637) BP: (103-153)/(54-88) 117/59 (02/18 1000) SpO2:  [95 %-99 %] 95 % (02/18 0637)  Intake/Output from previous day: 02/17 0701 - 02/18 0700 In: 716 [P.O.:716] Out: 860 [Urine:850; Drains:10] Intake/Output this shift: Total I/O In: 360 [P.O.:360] Out: 600 [Urine:600]  Exam:  Compartment soft  Labs:  Recent Labs  02/23/16 1100  HGB 13.2    Recent Labs  02/23/16 1100  WBC 11.5*  RBC 4.66  HCT 39.2  PLT 243    Recent Labs  02/23/16 1100  NA 138  K 4.1  CL 105  CO2 24  BUN 23*  CREATININE 1.27*  GLUCOSE 186*  CALCIUM 9.7   No results for input(s): LABPT, INR in the last 72 hours.  Assessment/Plan:  wound VAC in place - about 20 mL of drainage.  May need this for discharge home which will likely be tomorrow   G Alphonzo Severance 02/25/2016, 11:12 AM

## 2016-02-26 LAB — GLUCOSE, CAPILLARY
GLUCOSE-CAPILLARY: 177 mg/dL — AB (ref 65–99)
Glucose-Capillary: 144 mg/dL — ABNORMAL HIGH (ref 65–99)

## 2016-02-26 MED ORDER — OXYCODONE-ACETAMINOPHEN 5-325 MG PO TABS: 1.0000 | ORAL_TABLET | ORAL | 0 refills | Status: DC | PRN

## 2016-02-26 NOTE — Care Management Obs Status (Deleted)
[place] NOTIFICATION   Patient Details  Name: [name] MRN: [id number] Date of Birth: [birth year]   Medicare Observation Status Notification Given:  Yes    Lacretia Leigh, RN 02/26/2016, 9:40 AM

## 2016-02-26 NOTE — Progress Notes (Addendum)
Wound vac d/c per MD order. 4x4s and ace wrap applied. Incision approximated with staples-cdi.   Pt aware that he will pick up all equipment from the [facility] store per CM.   [place], Jerry Caras

## 2016-02-26 NOTE — Progress Notes (Signed)
Occupational Therapy Treatment Patient Details Name: CHANNER BIELAWSKI MRN: [id number] DOB: 04-Oct-1950 Today's Date: 02/26/2016    History of present illness Pt is a 66 yo male admitted on 02/23/16 for left BKA due to charcot rocker foot with chronic heal ulcer and osteomyelitis. PMH significant for DM with neuropathy, prostate CA, HLD, kidney stones, glaucoma, mononeuritis, venous insufficiency.     OT comments  Pt making good progress towards OT goals this session. OT able to address Pt and daughter concerns about WC level transfers, and practice balance compensatory strategies for grooming (use the 3 in 1 or chair with arm rests). Shower transfer discussed at length, Pt and daughter feel good about it. Pt and daughter very pleasant and grateful at end of session. Please see ADL section below for further information on session. Pt at adequate level for dc from OT perspective.   Follow Up Recommendations  No OT follow up;Supervision/Assistance - 24 hour (initially)    Equipment Recommendations  None recommended by OT    Recommendations for Other Services      Precautions / Restrictions Precautions Precautions: Fall Precaution Comments: new L BKA Restrictions Weight Bearing Restrictions: Yes LLE Weight Bearing: Non weight bearing       Mobility Bed Mobility               General bed mobility comments: Pt sitting OOB in recliner upon entering room  Transfers Overall transfer level: Needs assistance Equipment used: Rolling walker (2 wheeled) Transfers: Sit to/from Stand Sit to Stand: Min guard         General transfer comment: improved balance this session    Balance Overall balance assessment: Needs assistance Sitting-balance support: Bilateral upper extremity supported;Feet supported Sitting balance-Leahy Scale: Fair     Standing balance support: Bilateral upper extremity supported;During functional activity Standing balance-Leahy Scale: Poor                     ADL Overall ADL's : Needs assistance/impaired     Grooming: Wash/dry hands;Oral care;Modified independent;Sitting Grooming Details (indicate cue type and reason): edcuated Pt on using 3 in 1 (or chair with arm rests) to assist with sink level grooming to compensate for balance deficits Upper Body Bathing: Supervision/ safety;Sitting   Lower Body Bathing: Supervison/ safety;With caregiver independent assisting;Sitting/lateral leans Lower Body Bathing Details (indicate cue type and reason): new shower chair, and grab bars in walk in shower Upper Body Dressing : Modified independent;Sitting Upper Body Dressing Details (indicate cue type and reason): Pt dressed when OT entered the room, Pt reported independent Lower Body Dressing: Supervision/safety;With caregiver independent assisting;Bed level Lower Body Dressing Details (indicate cue type and reason): Pt fully dressed when OT entered the room. Pt reported that he dressed himself in bed with no assist. Daughter reports that she will be available to assit if needed             Functional mobility during ADLs: Min guard;Rolling walker General ADL Comments: Pt adjusting to new balance      Vision                     Perception     Praxis      Cognition   Behavior During Therapy: [facility] for tasks assessed/performed Overall Cognitive Status: Within Functional Limits for tasks assessed                  General Comments: daughter in room with Pt      Exercises  Shoulder Instructions       General Comments      Pertinent Vitals/ Pain       Pain Assessment: No/denies pain  Home Living                                          Prior Functioning/Environment              Frequency  Min 2X/week        Progress Toward Goals  OT Goals(current goals can now be found in the care plan section)  Progress towards OT goals: Progressing toward goals  Acute Rehab OT Goals Patient  Stated Goal: to be independent again OT Goal Formulation: With patient Time For Goal Achievement: 03/02/16 Potential to Achieve Goals: Good  Plan Discharge plan remains appropriate    Co-evaluation                 End of Session Equipment Utilized During Treatment: Gait belt;Rolling walker;Other (comment) (wound vac)  OT Visit Diagnosis: Other abnormalities of gait and mobility (R26.89)   Activity Tolerance Patient tolerated treatment well   Patient Left in chair;with call bell/phone within reach;with family/visitor present   Nurse Communication Mobility status;Precautions        Time: [id number] OT Time Calculation (min): 16 min  Charges: OT General Charges $OT Visit: 1 Procedure OT Treatments $Self Care/Home Management : 8-22 mins  Hulda Humphrey OTR/L [place] 02/26/2016, 10:52 AM

## 2016-02-26 NOTE — Care Management Note (Signed)
Case Management Note  Patient Details  Name: [name] MRN: [id number] Date of Birth: October 03, 1950  Subjective/Objective:     Adm w left below knee amputation               Action/Plan: lives at home w fam, pcp dr [name]   Expected Discharge Date:  02/26/16               Expected Discharge Plan:  [place]  In-House Referral:     Discharge planning Services  CM Consult  Post Acute Care Choice:  Durable Medical Equipment, Home Health Choice offered to:  Patient  DME Arranged:  Youth worker wheelchair with seat cushion, Walker rolling, Shower stool, Other see comment DME Agency:  [place]:  PT, OT [place] Agency:  [place]  Status of Service:  Completed, signed off  If discussed at [place] of Stay Meetings, dates discussed:    Additional Comments:needs w/c w cushion-tub stool w back-rolling walker, hhpt and hhot. Went over list of hhc agencies and no pref. Ref to [facility] w [facility] for eq and hhc. Pt for dc today. Spoke w dr [name] about filling out face to face and he states he will do face to face.   Lacretia Leigh, RN 02/26/2016, 9:24 AM

## 2016-02-26 NOTE — Progress Notes (Signed)
Discharge instructions (including medications) discussed with and copy provided to patient/caregiver 

## 2016-02-26 NOTE — Discharge Summary (Signed)
Discharge Diagnoses:  Active Problems:   Below knee amputation status, left [facility]   Surgeries: Procedure(s): Left Below Knee Amputation on 02/23/2016    Consultants:  none  Discharged Condition: Improved  Hospital Course: [name] is an 66 y.o. male who was admitted 02/23/2016 with a chief complaint of osteomyelitis ulceration left foot, with a final diagnosis of Osteomyelitis Left Foot.  Patient was brought to the operating room on 02/23/2016 and underwent Procedure(s): Left Below Knee Amputation.    Patient was given perioperative antibiotics: Anti-infectives    Start     Dose/Rate Route Frequency Ordered Stop   02/23/16 2000  ceFAZolin (ANCEF) IVPB 2g/100 mL premix     2 g 200 mL/hr over 30 Minutes Intravenous Every 6 hours 02/23/16 1553 02/24/16 0931   02/23/16 1300  ceFAZolin (ANCEF) 3 g in dextrose 5 % 50 mL IVPB     3 g 130 mL/hr over 30 Minutes Intravenous To Short Stay 02/22/16 0753 02/23/16 1409    .  Patient was given sequential compression devices, early ambulation, and aspirin for DVT prophylaxis.  Recent vital signs: Patient Vitals for the past 24 hrs:  BP Temp Temp src Pulse Resp SpO2  02/25/16 2048 (!) 151/67 98.5 F (36.9 C) Oral 86 16 96 %  02/25/16 1345 137/62 98.1 F (36.7 C) Oral 78 17 95 %  02/25/16 1000 (!) 117/59 - - 79 - -  .  Recent laboratory studies: No results found.  Discharge Medications:   Allergies as of 02/26/2016      Reactions   Atorvastatin Other (See Comments)   REACTION: myalgias   Ezetimibe Other (See Comments)   Body ache   Simvastatin Other (See Comments)   Body ache      Medication List    STOP taking these medications   doxycycline 100 MG tablet Commonly known as:  VIBRA-TABS   silver sulfADIAZINE 1 % cream Commonly known as:  SILVADENE     TAKE these medications   aspirin 325 MG tablet Take 325 mg by mouth daily.   carvedilol 25 MG tablet Commonly known as:  COREG TAKE 1 TABLET TWICE DAILY   FREESTYLE  INSULINX TEST test strip Generic drug:  glucose blood 1 each by Other route See admin instructions. Check blood sugar 3 times daily.   Insulin Pen Needle 31G X 5 MM Misc Commonly known as:  B-D UF III MINI PEN NEEDLES USE TO INJECT INSULIN 4 TIMES DAILY AS INSTRUCTED FOR DIABETES   losartan-hydrochlorothiazide 50-12.5 MG tablet Commonly known as:  HYZAAR TAKE 1 TABLET EVERY DAY   metFORMIN 1000 MG tablet Commonly known as:  GLUCOPHAGE TAKE 1 TABLET TWICE DAILY   naproxen sodium 220 MG tablet Commonly known as:  ANAPROX Take 440 mg by mouth 2 (two) times daily as needed (for pain.). For pain   NOVOLOG FLEXPEN 100 UNIT/ML FlexPen Generic drug:  insulin aspart INJECT  30  TO  35 UNITS SUBCUTANEOUSLY THREE TIMES DAILY WITH MEALS What changed:  See the new instructions.   oxyCODONE-acetaminophen 5-325 MG tablet Commonly known as:  ROXICET Take 1 tablet by mouth every 4 (four) hours as needed for severe pain.   potassium citrate 10 MEQ (1080 MG) SR tablet Commonly known as:  UROCIT-K Take 10 mEq by mouth 2 (two) times daily.   PROBIOTIC DAILY PO Take 1 capsule by mouth daily.   TOUJEO SOLOSTAR 300 UNIT/ML Sopn Generic drug:  Insulin Glargine INJECT 60 UNITS INTO THE SKIN 2 (TWO) TIMES  DAILY.   triamcinolone 0.025 % cream Commonly known as:  KENALOG APPLY 1 APPLICATION TOPICALLY 3 (THREE) TIMES DAILY AS NEEDED.       Diagnostic Studies: Xr Foot Complete Left  Result Date: 02/20/2016 3 view radiographs left foot show some lytic changes at the Charcot rocker-bottom deformity no definite osteomyelitis.   Patient benefited maximally from their hospital stay and there were no complications.     Disposition: 01-Home or Self Care Discharge Instructions    Call MD / Call 911    Complete by:  As directed    If you experience chest pain or shortness of breath, CALL 911 and be transported to the hospital emergency room.  If you develope a fever above 101 F, pus (white  drainage) or increased drainage or redness at the wound, or calf pain, call your surgeon's office.   Constipation Prevention    Complete by:  As directed    Drink plenty of fluids.  Prune juice may be helpful.  You may use a stool softener, such as Colace (over the counter) 100 mg twice a day.  Use MiraLax (over the counter) for constipation as needed.   Diet - low sodium heart healthy    Complete by:  As directed    Increase activity slowly as tolerated    Complete by:  As directed      Follow-up Information    [name], MD Follow up in 1 week(s).   Specialty:  Orthopedic Surgery Contact information: [address] [place] [address] [phone]            Signed: [name] 02/26/2016, 6:47 AM

## 2016-02-26 NOTE — Progress Notes (Signed)
Responded to consult for post-op pt w/ below-knee amputation. Provided emotional/spiritual support and prayer to him and family in rm: his wife and daughter. He was in good spirits -- to be discharged to day. They were all appreciative, especially of prayer. Chaplain available for f/u.   02/26/16 1300  Clinical Encounter Type  Visited With Patient and family together  Visit Type Initial;Psychological support;Spiritual support;Social support;Post-op  Referral From Nurse  Spiritual Encounters  Spiritual Needs Prayer;Emotional  Stress Factors  Patient Stress Factors Health changes;Loss of control  Family Stress Factors Family relationships;Health changes;Loss of control   Gerrit Heck, Chaplain

## 2016-02-26 NOTE — Progress Notes (Signed)
Physical Therapy Treatment Patient Details Name: [name] MRN: [id number] DOB: 1950/06/25 Today's Date: 02/26/2016    History of Present Illness Pt is a 66 yo male admitted on 02/23/16 for left BKA due to charcot rocker foot with chronic heal ulcer and osteomyelitis. PMH significant for DM with neuropathy, prostate CA, HLD, kidney stones, glaucoma, mononeuritis, venous insufficiency.      PT Comments    Patient is making good progress with PT.  From a mobility standpoint anticipate patient will be ready for DC home when medically ready.      Follow Up Recommendations  Home health PT;Supervision for mobility/OOB     Equipment Recommendations  Wheelchair cushion (measurements PT);Wheelchair (measurements PT);Other (comment) (elevating leg rests)    Recommendations for Other Services       Precautions / Restrictions Precautions Precautions: Fall Precaution Comments: new L BKA Restrictions Weight Bearing Restrictions: Yes LLE Weight Bearing: Non weight bearing    Mobility  Bed Mobility Overal bed mobility: Modified Independent             General bed mobility comments: increased time/effort; no rails and HOB flat  Transfers Overall transfer level: Needs assistance Equipment used: Rolling walker (2 wheeled) Transfers: Sit to/from Stand Sit to Stand: Min guard         General transfer comment: min guard for safety; carry over of safe hand placement  Ambulation/Gait Ambulation/Gait assistance: Min guard Ambulation Distance (Feet): 16 Feet Assistive device: Rolling walker (2 wheeled) Gait Pattern/deviations: Step-to pattern     General Gait Details: pt with steady step to pattern; one instance of unsteadiness with first turn however able to recover on his own   Stairs            Wheelchair Mobility    Modified Rankin (Stroke Patients Only)       Balance Overall balance assessment: Needs assistance Sitting-balance support: Bilateral upper  extremity supported;Feet supported Sitting balance-Leahy Scale: Good     Standing balance support: Bilateral upper extremity supported;During functional activity Standing balance-Leahy Scale: Poor                      Cognition Arousal/Alertness: Awake/alert Behavior During Therapy: WFL for tasks assessed/performed Overall Cognitive Status: Within Functional Limits for tasks assessed                 General Comments: daughter in room with Pt    Exercises Amputee Exercises Quad Sets: AROM;Both;10 reps Towel Squeeze: AROM;10 reps Hip ABduction/ADduction: AROM;Left;10 reps Hip Flexion/Marching: AROM;Left;10 reps Knee Extension: AROM;Left;10 reps;Seated    General Comments General comments (skin integrity, edema, etc.): daughter present for session; HEP handout given      Pertinent Vitals/Pain Pain Assessment: No/denies pain    Home Living                      Prior Function            PT Goals (current goals can now be found in the care plan section) Acute Rehab PT Goals Patient Stated Goal: to be independent again PT Goal Formulation: With patient Time For Goal Achievement: 03/02/16 Potential to Achieve Goals: Good Progress towards PT goals: Progressing toward goals    Frequency    Min 5X/week      PT Plan Current plan remains appropriate    Co-evaluation             End of Session Equipment Utilized During Treatment: Gait belt Activity  Tolerance: Patient tolerated treatment well Patient left: in chair;with call bell/phone within reach;with family/visitor present Nurse Communication: Mobility status       Time: [id number] PT Time Calculation (min) (ACUTE ONLY): 33 min  Charges:  $Gait Training: 8-22 mins $Therapeutic Exercise: 8-22 mins                    G Codes:       [place] April, PTA Pager: [phone]   02/26/2016, 1:04 PM

## 2016-02-27 ENCOUNTER — Other Ambulatory Visit: Payer: Self-pay | Admitting: Internal Medicine

## 2016-02-27 DIAGNOSIS — Z5181 Encounter for therapeutic drug level monitoring: Secondary | ICD-10-CM | POA: Diagnosis not present

## 2016-02-27 DIAGNOSIS — Z794 Long term (current) use of insulin: Secondary | ICD-10-CM | POA: Diagnosis not present

## 2016-02-27 DIAGNOSIS — E11610 Type 2 diabetes mellitus with diabetic neuropathic arthropathy: Secondary | ICD-10-CM | POA: Diagnosis not present

## 2016-02-27 DIAGNOSIS — E669 Obesity, unspecified: Secondary | ICD-10-CM | POA: Diagnosis not present

## 2016-02-27 DIAGNOSIS — Z4781 Encounter for orthopedic aftercare following surgical amputation: Secondary | ICD-10-CM | POA: Diagnosis not present

## 2016-02-27 DIAGNOSIS — Z89512 Acquired absence of left leg below knee: Secondary | ICD-10-CM | POA: Diagnosis not present

## 2016-02-28 DIAGNOSIS — Z89512 Acquired absence of left leg below knee: Secondary | ICD-10-CM | POA: Diagnosis not present

## 2016-02-28 DIAGNOSIS — Z794 Long term (current) use of insulin: Secondary | ICD-10-CM | POA: Diagnosis not present

## 2016-02-28 DIAGNOSIS — Z4781 Encounter for orthopedic aftercare following surgical amputation: Secondary | ICD-10-CM | POA: Diagnosis not present

## 2016-02-28 DIAGNOSIS — Z5181 Encounter for therapeutic drug level monitoring: Secondary | ICD-10-CM | POA: Diagnosis not present

## 2016-02-28 DIAGNOSIS — E669 Obesity, unspecified: Secondary | ICD-10-CM | POA: Diagnosis not present

## 2016-02-28 DIAGNOSIS — E11610 Type 2 diabetes mellitus with diabetic neuropathic arthropathy: Secondary | ICD-10-CM | POA: Diagnosis not present

## 2016-02-29 ENCOUNTER — Ambulatory Visit (INDEPENDENT_AMBULATORY_CARE_PROVIDER_SITE_OTHER): Payer: Medicare Other | Admitting: Orthopedic Surgery

## 2016-02-29 DIAGNOSIS — IMO0002 Reserved for concepts with insufficient information to code with codable children: Secondary | ICD-10-CM

## 2016-02-29 DIAGNOSIS — Z89512 Acquired absence of left leg below knee: Secondary | ICD-10-CM

## 2016-02-29 NOTE — Progress Notes (Signed)
Office Visit Note   Patient: [name]           Date of Birth: Jul 03, 1950           MRN: [id number] Visit Date: 02/29/2016              Requested by: [name], MD [place], [place] [address] PCP: Viviana Simpler, MD  No chief complaint on file.   HPI: The patient is a 66 year old gentleman who presents 1 week status post left below the knee amputation. [facility] doing [facility]. They have removed the surgical dressing and have been looking at it daily. Presents with dry dressing and Ace wrap today.    Assessment & Plan: Visit Diagnoses:  1. Below knee amputation status, left (HCC)     Plan: Begin daily Dial soap cleansing. Apply dry dressing and Ace wrap. May wear the shrinker for compression. Elevate for swelling follow-up in office in 2 more weeks.  Follow-Up Instructions: Return in about 2 weeks (around 03/14/2016).   Ortho Exam Incision well approximated with staples. There is scant bloody drainage. Minimal swelling no gaping noted drainage no odor no sign of infection   Imaging: No results found.  Orders:  No orders of the defined types were placed in this encounter.  No orders of the defined types were placed in this encounter.    Procedures: No procedures performed  Clinical Data: No additional findings.  Subjective: Review of Systems  Constitutional: Negative for chills and fever.    Objective: Vital Signs: There were no vitals taken for this visit.  Specialty Comments:  No specialty comments available.  PMFS History: Patient Active Problem List   Diagnosis Date Noted  . Below knee amputation status, left ([place]) 02/23/2016  . Type 2 diabetes mellitus with diabetic polyneuropathy, with long-term current use of insulin ([place]) 03/29/2015  . Advance directive discussed with patient 02/07/2014  . Normocytic anemia 04/01/2013  . Diabetic Charcot foot ([place]) 04/01/2013  . Status post  amputation of toe of left foot ([place]) 01/19/2013  . BMI 40.0-44.9, adult ([place]) 12/30/2012  . Subacute osteomyelitis, left ankle and foot ([place]) 12/30/2012  . Routine general medical examination at a health care facility 12/25/2010  . HEMORRHOIDS-INTERNAL 11/22/2009  . PERSONAL HX COLONIC POLYPS 11/22/2009  . VENOUS INSUFFICIENCY 06/29/2008  . Osteoarthritis, multiple sites 04/03/2007  . ERECTILE DYSFUNCTION, ORGANIC 06/13/2006  . Hyperlipemia 06/11/2006  . GLAUCOMA 06/11/2006  . Coronary atherosclerosis of native coronary artery 06/11/2006  . Obstructive sleep apnea 06/11/2006   Past Medical History:  Diagnosis Date  . Coronary atherosclerosis of unspecified type of vessel, native or graft   . Diabetes mellitus without complication ([place])   . Hemorrhage of rectum and anus   . History of kidney stones   . Impotence of organic origin   . Internal hemorrhoids without mention of complication   . Malignant neoplasm of prostate ([place])   . Mononeuritis of unspecified site   . Osteoarthrosis, unspecified whether generalized or localized, unspecified site   . Other and unspecified hyperlipidemia   . Personal history of colonic polyps   . Personal history of diabetic foot ulcer    saw wound center, resolved 05/08/2010  . Personal history of gallstones   . Routine general medical examination at a health care facility   . Type II or unspecified type diabetes mellitus with neurological manifestations, not stated as uncontrolled(250.60)   . Unspecified glaucoma(365.9)   .  Unspecified sleep apnea    cpcp  . Unspecified venous (peripheral) insufficiency     Family History  Problem Relation Age of Onset  . Lung cancer Father   . Multiple sclerosis Mother   . Heart attack      paternal aunts and uncles  . Peripheral vascular disease Maternal Grandfather     several amputations    Past Surgical History:  Procedure Laterality Date  . AMPUTATION Left 12/30/2012   Procedure: AMPUTATION RAY ;   Surgeon: Meredith Pel, MD;  Location: [facility];  Service: Orthopedics;  Laterality: Left;  LEFT GREAT TOE RAY AMPUTATION  . AMPUTATION Left 02/23/2016   Procedure: Left Below Knee Amputation;  Surgeon: [name], MD;  Location: [facility];  Service: Orthopedics;  Laterality: Left;  . APPENDECTOMY    . BASAL CELL CARCINOMA EXCISION  2/16   left forearm  . CARDIAC CATHETERIZATION  1998   Negative  . CATARACT EXTRACTION Right 2017   then lid surgery  . CORONARY ARTERY BYPASS GRAFT  09/2005   Post op AFIB  . EYE SURGERY    . FOOT BONE EXCISION Left 11/03/2013   DR DUDA   . I&D EXTREMITY Left 11/03/2013   Procedure: Left Foot Partial Bone Excision Cuboid and Medial Cuneiform, Wound Closures;  Surgeon: [name], MD;  Location: [facility];  Service: Orthopedics;  Laterality: Left;  . INSERTION PROSTATE RADIATION SEED  2009   RT and seeds for prostate cancer  . KIDNEY STONE SURGERY  04/1993  . RCA stents  04/2003   EF 55%  . RETINAL DETACHMENT SURGERY  2002-2003  . thrombosed vein  1993   Right leg   Social History   Occupational History  . Heavy equipment mechanic--retired    Social History Main Topics  . Smoking status: Former Smoker    Packs/day: 2.00    Years: 35.00    Types: Cigars, Cigarettes    Quit date: 01/07/2001  . Smokeless tobacco: Never Used  . Alcohol use Yes     Comment: rare  . Drug use: No  . Sexual activity: Yes    Partners: Female

## 2016-03-01 DIAGNOSIS — E11610 Type 2 diabetes mellitus with diabetic neuropathic arthropathy: Secondary | ICD-10-CM | POA: Diagnosis not present

## 2016-03-01 DIAGNOSIS — Z4781 Encounter for orthopedic aftercare following surgical amputation: Secondary | ICD-10-CM | POA: Diagnosis not present

## 2016-03-01 DIAGNOSIS — Z89512 Acquired absence of left leg below knee: Secondary | ICD-10-CM | POA: Diagnosis not present

## 2016-03-01 DIAGNOSIS — Z5181 Encounter for therapeutic drug level monitoring: Secondary | ICD-10-CM | POA: Diagnosis not present

## 2016-03-01 DIAGNOSIS — E669 Obesity, unspecified: Secondary | ICD-10-CM | POA: Diagnosis not present

## 2016-03-01 DIAGNOSIS — Z794 Long term (current) use of insulin: Secondary | ICD-10-CM | POA: Diagnosis not present

## 2016-03-04 ENCOUNTER — Telehealth (INDEPENDENT_AMBULATORY_CARE_PROVIDER_SITE_OTHER): Payer: Self-pay | Admitting: Orthopedic Surgery

## 2016-03-04 DIAGNOSIS — E669 Obesity, unspecified: Secondary | ICD-10-CM | POA: Diagnosis not present

## 2016-03-04 DIAGNOSIS — Z89512 Acquired absence of left leg below knee: Secondary | ICD-10-CM | POA: Diagnosis not present

## 2016-03-04 DIAGNOSIS — E11610 Type 2 diabetes mellitus with diabetic neuropathic arthropathy: Secondary | ICD-10-CM | POA: Diagnosis not present

## 2016-03-04 DIAGNOSIS — Z5181 Encounter for therapeutic drug level monitoring: Secondary | ICD-10-CM | POA: Diagnosis not present

## 2016-03-04 DIAGNOSIS — Z794 Long term (current) use of insulin: Secondary | ICD-10-CM | POA: Diagnosis not present

## 2016-03-04 DIAGNOSIS — Z4781 Encounter for orthopedic aftercare following surgical amputation: Secondary | ICD-10-CM | POA: Diagnosis not present

## 2016-03-04 NOTE — Telephone Encounter (Signed)
Phys therapist called to inform us that pt incision looks good but pt has a new water blisters. She wanted to make [place] aware and if there's something she needs to do let her know.  Jobe Gibbon [phone]

## 2016-03-05 ENCOUNTER — Encounter (INDEPENDENT_AMBULATORY_CARE_PROVIDER_SITE_OTHER): Payer: Self-pay | Admitting: Family

## 2016-03-05 ENCOUNTER — Ambulatory Visit (INDEPENDENT_AMBULATORY_CARE_PROVIDER_SITE_OTHER): Payer: Medicare Other | Admitting: Orthopedic Surgery

## 2016-03-05 ENCOUNTER — Ambulatory Visit (INDEPENDENT_AMBULATORY_CARE_PROVIDER_SITE_OTHER): Payer: Medicare Other | Admitting: Internal Medicine

## 2016-03-05 ENCOUNTER — Encounter: Payer: Self-pay | Admitting: Internal Medicine

## 2016-03-05 VITALS — BP 118/74 | HR 87 | Wt 286.0 lb

## 2016-03-05 DIAGNOSIS — I2510 Atherosclerotic heart disease of native coronary artery without angina pectoris: Secondary | ICD-10-CM

## 2016-03-05 DIAGNOSIS — E1142 Type 2 diabetes mellitus with diabetic polyneuropathy: Secondary | ICD-10-CM | POA: Diagnosis not present

## 2016-03-05 DIAGNOSIS — Z89512 Acquired absence of left leg below knee: Secondary | ICD-10-CM

## 2016-03-05 DIAGNOSIS — Z794 Long term (current) use of insulin: Secondary | ICD-10-CM

## 2016-03-05 NOTE — Progress Notes (Signed)
Patient ID: [name], male   DOB: 03-09-1950, 66 y.o.   MRN: [id number]  HPI: [name] is a 66 y.o.-year-old male, returning for f/u for DM2, dx 2008, insulin-dependent since dx, uncontrolled, with complications (peripheral neuropathy, CAD-status post CABG in 2008, PVD, Charcot foot, history of diabetic foot ulcer, history of retinal detachment in 2002-3). Last visit 3 mo ago. 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 Also Part D - [employer].   He had a L BKA (02/23/2016) b/c he had a long h/o ulcer on his Charcot foot >> developed osteomyelitis.  Last hemoglobin A1c was: Lab Results  Component Value Date   HGBA1C 6.1 02/14/2016   HGBA1C 7.7 12/04/2015   HGBA1C 8.0 08/31/2015   He did not follow my insulin dose instructions after last visit (he forgot we changed the doses!): Pt is on a regimen of:  x 1 mo  - Toujeo 60 units 2x a day >> 50 units daily - Humalog at mealtime:  90-130: 25 units >130: 35 units  Pt checks his sugars 1-3x a day and they are better- reviewed sugar log: - am:152-193, 210, 257 >> 148-196, 208 >> 131, 142-240 (Thanksgiving) >> 62-135, 176 - before lunch:  134, 170 >> 187 >> 138-201 >> n/c >> 146-240 >> 69, 104-154, 207 - before dinner: 1 150-211, 227 >> 130--177, 197 >> 148-256 >> n/c >> 59, 77-135, 165 - bedtime:  n/c >> 244 >> n/c >> 194-310 >> 117, 175-236 >> n/c No lows. Lowest: 131 >> 59 x1 Highest sugar:240 >> 207  Pt's meals are: - Breakfast: bacon, eggs, tomato sandwich (sometimes no bread) - Lunch: meat + 2 veggies (no potatoes) - Dinner: meat + 2 veggies - Snacks: 1 a day - 2x a week, icecream  Uses Splenda in tea and coffee.  - + mild CKD, last BUN/creatinine:  Lab Results  Component Value Date   BUN 23 (H) 02/23/2016   CREATININE 1.27 (H) 02/23/2016  On Losartan. - last set of lipids: Lab Results  Component Value Date   CHOL 125 02/14/2016   HDL 26.80 (L) 02/14/2016   LDLCALC 71 02/14/2016   LDLDIRECT 72.0  02/13/2015   TRIG 135.0 02/14/2016   CHOLHDL 5 02/14/2016  Not on a statin as intolerant. - last eye exam was 01/12/2016. No DR. Dr [name].   He had a detached retina in 03/2015 >> had Laser sx (his 3rd). He had cataract sx (R) 06/2015. - + numbness and tingling in his feet. No sensation in his feet.   He had amputation of his big toe in 12/30/2012 2/2 infection >> osteomyelitis. He continued to have problems with ulcers on his left foot, in the setting of Charcot joint. He had a left BKA recently as mentioned above.  He also has a history of Prostate cancer, s/p 25 RxTx, then radioactive seeds. Also, hyperlipidemia, hypertension, sleep apnea, osteoarthrosis.   ROS: Constitutional: no weight loss/gain, no fatigue, no subjective hyperthermia/hypothermia Eyes: no blurry vision, no xerophthalmia ENT: no sore throat, no nodules palpated in throat, no dysphagia/odynophagia, no hoarseness Cardiovascular: no CP/SOB/palpitations/no leg swelling Respiratory: no cough/SOB Gastrointestinal: no N/V/D/no C Musculoskeletal: no muscle/no joint aches; + L BKA >> no pain Skin: no rashes Neurological: no tremors/numbness/tingling/dizziness  I reviewed pt's medications, allergies, PMH, social hx, family hx, and changes were documented in the history of present illness. Otherwise, unchanged from my initial visit note.  PE: BP 118/74 (BP Location: Left Arm, Patient Position: Sitting)  Pulse 87   Wt 286 lb (129.7 kg)   SpO2 97%   BMI 36.72 kg/m  Body mass index is 36.72 kg/m.  Wt Readings from Last 3 Encounters:  03/05/16 286 lb (129.7 kg)  02/23/16 (!) 302 lb (137 kg)  02/20/16 (!) 302 lb (137 kg)   Constitutional: obese, in NAD, ruddy complexion Eyes: PERRLA, EOMI, no exophthalmos ENT: moist mucous membranes, no thyromegaly, no cervical lymphadenopathy; large neck Cardiovascular: RRR, No MRG, + L BKA and right lower extremity edema  Respiratory: CTA B Gastrointestinal: abdomen soft, NT,  ND, BS+ Musculoskeletal: no deformities, strength intact in all 4 Skin: moist, warm, stasis dermatitis in R leg  ASSESSMENT: 1. DM2, insulin-dependent, uncontrolled, with complications - peripheral neuropathy - CAD-status post stent in 2005, then 5v CABG in 2008 - PVD - history of diabetic foot ulcer - Charcot foot - L - h/o amputation of his big toe in 12/2012 2/2 osteomyelitis and L BKA 01/2016 - history of retinal detachment in 2002-3  PLAN:  1. Patient uncontrolled DM2 on basal -bolus insulin regimenand now off metformin. He mentions that he stopped metformin as his sugars were dropping after he started the very strict diet, with 500 cal daily. He also skipped some of the insulin doses and whenever he took it he took lower doses. He is now off this diet, but starting a less strict one. We will decrease his insulin doses but add a half maximum dose of metformin back, as he complains of diarrhea with 2000 mg at one time. I advised him to let me know if he has any more lows, in that case we need to back off the insulin doses even more.  - we reviewed together his last HbA1c obtained earlier this month, which was wonderful, at 6.1% - I advised him to:  Patient Instructions  Please restart: - Metformin 1000 mg with dinner  Please decrease: - Toujeo to 40 units at bedtime - Humalog:   20 units before a smaller meal  25 units before a larger meal  Add a Humalog Sliding scale: - 150-175: + 3 unit  - 176-200: + 5 units  - > 200: + 7 units    Please return in 2 months with your sugar log (after 05/08/2016).   - continue checking sugars at different times of the day - check 3 times a day, rotating checks - advised for yearly eye exams >> up to date - Return to clinic in 2 months with sugar log   Martin Kingdom, MD PhD [facility] Endocrinology

## 2016-03-05 NOTE — Telephone Encounter (Signed)
I called left voicemail for patient to advise him to come in today to see Erin to evaluate leg and this new blistering. I called and spoke with Stacy to advise this as well. She is going to try to contact patient to have him come in today.

## 2016-03-05 NOTE — Patient Instructions (Signed)
Please restart: - Metformin 1000 mg with dinner  Please decrease: - Toujeo to 40 units at bedtime - Humalog:   20 units before a smaller meal  25 units before a larger meal  Add a Humalog Sliding scale: - 150-175: + 3 unit  - 176-200: + 5 units  - > 200: + 7 units    Please return in 2 months with your sugar log (after 05/08/2016).

## 2016-03-05 NOTE — Progress Notes (Signed)
Office Visit Note   Patient: [name]           Date of Birth: [birth year]           MRN: [id number] Visit Date: 03/05/2016              Requested by: [name], MD [place], [place] [address] PCP: Viviana Simpler, MD  Chief Complaint  Patient presents with  . Left Knee - Wound Check    02/23/16 LBKA    HPI: Patient is a 66 y.o male who presents today for post operative check of left transtibial amputation. He is 11 days post op. His home health physical therapist had notified the office of new blistering end of residual limb. There is yellow drainage from blister, and bloody drainage from incision. Staples are intact. There is redness and mild warmth. [name], RT    Assessment & Plan: Visit Diagnoses:  1. Status post below knee amputation of left lower extremity ([place])     Plan: Patient was given a prescription for biotech for a prosthesis and a stump shrinker he will wear this finger directly against the skin washed the leg with soap and water daily follow-up in 2 weeks.  Follow-Up Instructions: Return in about 2 weeks (around 03/19/2016).   Ortho Exam Examination there is a swelling blister that has resolved. There is no redness no cellulitis no odor is very small amount of serosanguineous drainage. There is no signs of infection no signs of wound dehiscence. The stump shrinker was reapplied directly to the skin.  Imaging: No results found.  Orders:  No orders of the defined types were placed in this encounter.  No orders of the defined types were placed in this encounter.    Procedures: No procedures performed  Clinical Data: No additional findings.  Subjective: Review of Systems  Objective: Vital Signs: There were no vitals taken for this visit.  Specialty Comments:  No specialty comments available.  PMFS History: Patient Active Problem List   Diagnosis Date Noted  . Status post below knee amputation of left lower  extremity ([place]) 02/23/2016  . Type 2 diabetes mellitus with diabetic polyneuropathy, with long-term current use of insulin ([place]) 03/29/2015  . Advance directive discussed with patient 02/07/2014  . Normocytic anemia 04/01/2013  . Diabetic Charcot foot ([place]) 04/01/2013  . Status post amputation of toe of left foot ([place]) 01/19/2013  . BMI 40.0-44.9, adult ([place]) 12/30/2012  . Subacute osteomyelitis, left ankle and foot ([place]) 12/30/2012  . Routine general medical examination at a health care facility 12/25/2010  . HEMORRHOIDS-INTERNAL 11/22/2009  . PERSONAL HX COLONIC POLYPS 11/22/2009  . VENOUS INSUFFICIENCY 06/29/2008  . Osteoarthritis, multiple sites 04/03/2007  . ERECTILE DYSFUNCTION, ORGANIC 06/13/2006  . Hyperlipemia 06/11/2006  . GLAUCOMA 06/11/2006  . Coronary atherosclerosis of native coronary artery 06/11/2006  . Obstructive sleep apnea 06/11/2006   Past Medical History:  Diagnosis Date  . Coronary atherosclerosis of unspecified type of vessel, native or graft   . Diabetes mellitus without complication ([place])   . Hemorrhage of rectum and anus   . History of kidney stones   . Impotence of organic origin   . Internal hemorrhoids without mention of complication   . Malignant neoplasm of prostate ([place])   . Mononeuritis of unspecified site   . Osteoarthrosis, unspecified whether generalized or localized, unspecified site   . Other and unspecified hyperlipidemia   . Personal history of colonic polyps   .  Personal history of diabetic foot ulcer    saw wound center, resolved 05/08/2010  . Personal history of gallstones   . Routine general medical examination at a health care facility   . Type II or unspecified type diabetes mellitus with neurological manifestations, not stated as uncontrolled(250.60)   . Unspecified glaucoma(365.9)   . Unspecified sleep apnea    cpcp  . Unspecified venous (peripheral) insufficiency     Family History  Problem Relation Age of Onset  . Lung  cancer Father   . Multiple sclerosis Mother   . Heart attack      paternal aunts and uncles  . Peripheral vascular disease Maternal Grandfather     several amputations    Past Surgical History:  Procedure Laterality Date  . AMPUTATION Left 12/30/2012   Procedure: AMPUTATION RAY ;  Surgeon: Meredith Pel, MD;  Location: [facility];  Service: Orthopedics;  Laterality: Left;  LEFT GREAT TOE RAY AMPUTATION  . AMPUTATION Left 02/23/2016   Procedure: Left Below Knee Amputation;  Surgeon: [name], MD;  Location: [place];  Service: Orthopedics;  Laterality: Left;  . APPENDECTOMY    . BASAL CELL CARCINOMA EXCISION  2/16   left forearm  . CARDIAC CATHETERIZATION  1998   Negative  . CATARACT EXTRACTION Right 2017   then lid surgery  . CORONARY ARTERY BYPASS GRAFT  09/2005   Post op AFIB  . EYE SURGERY    . FOOT BONE EXCISION Left 11/03/2013   DR DUDA   . I&D EXTREMITY Left 11/03/2013   Procedure: Left Foot Partial Bone Excision Cuboid and Medial Cuneiform, Wound Closures;  Surgeon: [name], MD;  Location: [facility];  Service: Orthopedics;  Laterality: Left;  . INSERTION PROSTATE RADIATION SEED  2009   RT and seeds for prostate cancer  . KIDNEY STONE SURGERY  04/1993  . RCA stents  04/2003   EF 55%  . RETINAL DETACHMENT SURGERY  2002-2003  . thrombosed vein  1993   Right leg   Social History   Occupational History  . Heavy equipment mechanic--retired    Social History Main Topics  . Smoking status: Former Smoker    Packs/day: 2.00    Years: 35.00    Types: Cigars, Cigarettes    Quit date: 01/07/2001  . Smokeless tobacco: Never Used  . Alcohol use Yes     Comment: rare  . Drug use: No  . Sexual activity: Yes    Partners: Female

## 2016-03-06 ENCOUNTER — Ambulatory Visit (INDEPENDENT_AMBULATORY_CARE_PROVIDER_SITE_OTHER): Payer: Medicare Other | Admitting: Orthopedic Surgery

## 2016-03-06 DIAGNOSIS — Z89512 Acquired absence of left leg below knee: Secondary | ICD-10-CM | POA: Diagnosis not present

## 2016-03-06 DIAGNOSIS — Z794 Long term (current) use of insulin: Secondary | ICD-10-CM | POA: Diagnosis not present

## 2016-03-06 DIAGNOSIS — E669 Obesity, unspecified: Secondary | ICD-10-CM | POA: Diagnosis not present

## 2016-03-06 DIAGNOSIS — Z4781 Encounter for orthopedic aftercare following surgical amputation: Secondary | ICD-10-CM | POA: Diagnosis not present

## 2016-03-06 DIAGNOSIS — E11610 Type 2 diabetes mellitus with diabetic neuropathic arthropathy: Secondary | ICD-10-CM | POA: Diagnosis not present

## 2016-03-06 DIAGNOSIS — Z5181 Encounter for therapeutic drug level monitoring: Secondary | ICD-10-CM | POA: Diagnosis not present

## 2016-03-08 DIAGNOSIS — Z4781 Encounter for orthopedic aftercare following surgical amputation: Secondary | ICD-10-CM | POA: Diagnosis not present

## 2016-03-08 DIAGNOSIS — Z794 Long term (current) use of insulin: Secondary | ICD-10-CM | POA: Diagnosis not present

## 2016-03-08 DIAGNOSIS — Z89512 Acquired absence of left leg below knee: Secondary | ICD-10-CM | POA: Diagnosis not present

## 2016-03-08 DIAGNOSIS — E11610 Type 2 diabetes mellitus with diabetic neuropathic arthropathy: Secondary | ICD-10-CM | POA: Diagnosis not present

## 2016-03-08 DIAGNOSIS — Z5181 Encounter for therapeutic drug level monitoring: Secondary | ICD-10-CM | POA: Diagnosis not present

## 2016-03-08 DIAGNOSIS — E669 Obesity, unspecified: Secondary | ICD-10-CM | POA: Diagnosis not present

## 2016-03-11 DIAGNOSIS — Z4781 Encounter for orthopedic aftercare following surgical amputation: Secondary | ICD-10-CM | POA: Diagnosis not present

## 2016-03-11 DIAGNOSIS — Z89512 Acquired absence of left leg below knee: Secondary | ICD-10-CM | POA: Diagnosis not present

## 2016-03-11 DIAGNOSIS — E11610 Type 2 diabetes mellitus with diabetic neuropathic arthropathy: Secondary | ICD-10-CM | POA: Diagnosis not present

## 2016-03-11 DIAGNOSIS — Z794 Long term (current) use of insulin: Secondary | ICD-10-CM | POA: Diagnosis not present

## 2016-03-11 DIAGNOSIS — H02052 Trichiasis without entropian right lower eyelid: Secondary | ICD-10-CM | POA: Diagnosis not present

## 2016-03-11 DIAGNOSIS — Z5181 Encounter for therapeutic drug level monitoring: Secondary | ICD-10-CM | POA: Diagnosis not present

## 2016-03-11 DIAGNOSIS — E669 Obesity, unspecified: Secondary | ICD-10-CM | POA: Diagnosis not present

## 2016-03-12 DIAGNOSIS — Z794 Long term (current) use of insulin: Secondary | ICD-10-CM | POA: Diagnosis not present

## 2016-03-12 DIAGNOSIS — Z4781 Encounter for orthopedic aftercare following surgical amputation: Secondary | ICD-10-CM | POA: Diagnosis not present

## 2016-03-12 DIAGNOSIS — E669 Obesity, unspecified: Secondary | ICD-10-CM | POA: Diagnosis not present

## 2016-03-12 DIAGNOSIS — E11610 Type 2 diabetes mellitus with diabetic neuropathic arthropathy: Secondary | ICD-10-CM | POA: Diagnosis not present

## 2016-03-12 DIAGNOSIS — Z5181 Encounter for therapeutic drug level monitoring: Secondary | ICD-10-CM | POA: Diagnosis not present

## 2016-03-12 DIAGNOSIS — Z89512 Acquired absence of left leg below knee: Secondary | ICD-10-CM | POA: Diagnosis not present

## 2016-03-13 ENCOUNTER — Telehealth (INDEPENDENT_AMBULATORY_CARE_PROVIDER_SITE_OTHER): Payer: Self-pay | Admitting: Orthopedic Surgery

## 2016-03-13 DIAGNOSIS — Z794 Long term (current) use of insulin: Secondary | ICD-10-CM | POA: Diagnosis not present

## 2016-03-13 DIAGNOSIS — Z5181 Encounter for therapeutic drug level monitoring: Secondary | ICD-10-CM | POA: Diagnosis not present

## 2016-03-13 DIAGNOSIS — E669 Obesity, unspecified: Secondary | ICD-10-CM | POA: Diagnosis not present

## 2016-03-13 DIAGNOSIS — E11610 Type 2 diabetes mellitus with diabetic neuropathic arthropathy: Secondary | ICD-10-CM | POA: Diagnosis not present

## 2016-03-13 DIAGNOSIS — Z4781 Encounter for orthopedic aftercare following surgical amputation: Secondary | ICD-10-CM | POA: Diagnosis not present

## 2016-03-13 DIAGNOSIS — Z89512 Acquired absence of left leg below knee: Secondary | ICD-10-CM | POA: Diagnosis not present

## 2016-03-13 NOTE — Telephone Encounter (Signed)
Ester, Nurse with [facility] wanted to request a verbal for 2 more visits to complete pt's plan of care.   [phone]

## 2016-03-13 NOTE — Telephone Encounter (Signed)
Called and lm on vm to advise verbal ok for two additional visits pt is s/p a bka

## 2016-03-14 ENCOUNTER — Ambulatory Visit (INDEPENDENT_AMBULATORY_CARE_PROVIDER_SITE_OTHER): Payer: Medicare Other | Admitting: Orthopedic Surgery

## 2016-03-15 DIAGNOSIS — E669 Obesity, unspecified: Secondary | ICD-10-CM | POA: Diagnosis not present

## 2016-03-15 DIAGNOSIS — Z4781 Encounter for orthopedic aftercare following surgical amputation: Secondary | ICD-10-CM | POA: Diagnosis not present

## 2016-03-15 DIAGNOSIS — Z794 Long term (current) use of insulin: Secondary | ICD-10-CM | POA: Diagnosis not present

## 2016-03-15 DIAGNOSIS — Z89512 Acquired absence of left leg below knee: Secondary | ICD-10-CM | POA: Diagnosis not present

## 2016-03-15 DIAGNOSIS — E11610 Type 2 diabetes mellitus with diabetic neuropathic arthropathy: Secondary | ICD-10-CM | POA: Diagnosis not present

## 2016-03-15 DIAGNOSIS — Z5181 Encounter for therapeutic drug level monitoring: Secondary | ICD-10-CM | POA: Diagnosis not present

## 2016-03-18 ENCOUNTER — Ambulatory Visit (INDEPENDENT_AMBULATORY_CARE_PROVIDER_SITE_OTHER): Payer: Medicare Other | Admitting: Orthopedic Surgery

## 2016-03-18 DIAGNOSIS — Z5181 Encounter for therapeutic drug level monitoring: Secondary | ICD-10-CM | POA: Diagnosis not present

## 2016-03-18 DIAGNOSIS — Z89512 Acquired absence of left leg below knee: Secondary | ICD-10-CM | POA: Diagnosis not present

## 2016-03-18 DIAGNOSIS — Z794 Long term (current) use of insulin: Secondary | ICD-10-CM | POA: Diagnosis not present

## 2016-03-18 DIAGNOSIS — Z4781 Encounter for orthopedic aftercare following surgical amputation: Secondary | ICD-10-CM | POA: Diagnosis not present

## 2016-03-18 DIAGNOSIS — E669 Obesity, unspecified: Secondary | ICD-10-CM | POA: Diagnosis not present

## 2016-03-18 DIAGNOSIS — E11610 Type 2 diabetes mellitus with diabetic neuropathic arthropathy: Secondary | ICD-10-CM | POA: Diagnosis not present

## 2016-03-19 ENCOUNTER — Encounter (INDEPENDENT_AMBULATORY_CARE_PROVIDER_SITE_OTHER): Payer: Self-pay | Admitting: Orthopedic Surgery

## 2016-03-19 ENCOUNTER — Ambulatory Visit (INDEPENDENT_AMBULATORY_CARE_PROVIDER_SITE_OTHER): Payer: Medicare Other | Admitting: Orthopedic Surgery

## 2016-03-19 DIAGNOSIS — Z89512 Acquired absence of left leg below knee: Secondary | ICD-10-CM

## 2016-03-19 NOTE — Progress Notes (Signed)
Office Visit Note   Patient: [name]           Date of Birth: May 12, 1950           MRN: [id number] Visit Date: 03/19/2016              Requested by: [name], MD [place], [place] [address] PCP: Viviana Simpler, MD  Chief Complaint  Patient presents with  . Left Knee - Follow-up    Left Below Knee Amputation     HPI: Patient is a 66 y.o male who presents for follow up for left transtibial amputation. He is approximately 3 weeks 4 days post op. He is doing well overall. Previous blister has resolved uneventfully. There is no drainage. He no longer is taking pain medication. He has some soreness medially. He is wearing shrinker daily with staples intact. [name], RT    Assessment & Plan: Visit Diagnoses:  1. Status post below knee amputation of left lower extremity ([facility])     Plan: Harvest the staples today.  Patient will followed by a tech for a new stump shrinker is currently in a 3 XL and needs a 2 XL.  Follow-Up Instructions: Return in about 3 weeks (around 04/09/2016).   Ortho Exam On examination the incision is healing quite nicely there is no redness no synovitis no drainage no signs of infection there is no open ulcers. ROS: Negative for fever or chills. Imaging: No results found.  Labs: Lab Results  Component Value Date   HGBA1C 6.1 02/14/2016   HGBA1C 7.7 12/04/2015   HGBA1C 8.0 08/31/2015   ESRSEDRATE 35 (H) 03/31/2013   CRP 5.3 (H) 03/31/2013   REPTSTATUS 01/02/2013 FINAL 12/30/2012   GRAMSTAIN  12/30/2012    FEW WBC PRESENT, PREDOMINANTLY PMN RARE SQUAMOUS EPITHELIAL CELLS PRESENT ABUNDANT GRAM NEGATIVE RODS MODERATE GRAM POSITIVE COCCI IN PAIRS IN CLUSTERS   CULT  12/30/2012    MULTIPLE ORGANISMS PRESENT, NONE PREDOMINANT NO STAPHYLOCOCCUS AUREUS ISOLATED NO GROUP A STREP (S.PYOGENES) ISOLATED Performed at [facility] 12/15/2012   LABORGA PSEUDOMONAS AERUGINOSA  12/15/2012    Orders:  No orders of the defined types were placed in this encounter.  No orders of the defined types were placed in this encounter.    Procedures: No procedures performed  Clinical Data: No additional findings.  Subjective: Review of Systems  Objective: Vital Signs: There were no vitals taken for this visit.  Specialty Comments:  No specialty comments available.  PMFS History: Patient Active Problem List   Diagnosis Date Noted  . Status post below knee amputation of left lower extremity ([place]) 02/23/2016  . Type 2 diabetes mellitus with diabetic polyneuropathy, with long-term current use of insulin ([place]) 03/29/2015  . Advance directive discussed with patient 02/07/2014  . Normocytic anemia 04/01/2013  . Diabetic Charcot foot ([place]) 04/01/2013  . Status post amputation of toe of left foot ([place]) 01/19/2013  . BMI 40.0-44.9, adult ([place]) 12/30/2012  . Subacute osteomyelitis, left ankle and foot ([place]) 12/30/2012  . Routine general medical examination at a health care facility 12/25/2010  . HEMORRHOIDS-INTERNAL 11/22/2009  . PERSONAL HX COLONIC POLYPS 11/22/2009  . VENOUS INSUFFICIENCY 06/29/2008  . Osteoarthritis, multiple sites 04/03/2007  . ERECTILE DYSFUNCTION, ORGANIC 06/13/2006  . Hyperlipemia 06/11/2006  . GLAUCOMA 06/11/2006  . Coronary atherosclerosis of native coronary artery 06/11/2006  . Obstructive sleep apnea 06/11/2006   Past Medical History:  Diagnosis Date  .  Coronary atherosclerosis of unspecified type of vessel, native or graft   . Diabetes mellitus without complication ([place])   . Hemorrhage of rectum and anus   . History of kidney stones   . Impotence of organic origin   . Internal hemorrhoids without mention of complication   . Malignant neoplasm of prostate ([place])   . Mononeuritis of unspecified site   . Osteoarthrosis, unspecified whether generalized or localized, unspecified site   . Other and unspecified hyperlipidemia   .  Personal history of colonic polyps   . Personal history of diabetic foot ulcer    saw wound center, resolved 05/08/2010  . Personal history of gallstones   . Routine general medical examination at a health care facility   . Type II or unspecified type diabetes mellitus with neurological manifestations, not stated as uncontrolled(250.60)   . Unspecified glaucoma(365.9)   . Unspecified sleep apnea    cpcp  . Unspecified venous (peripheral) insufficiency     Family History  Problem Relation Age of Onset  . Lung cancer Father   . Multiple sclerosis Mother   . Heart attack      paternal aunts and uncles  . Peripheral vascular disease Maternal Grandfather     several amputations    Past Surgical History:  Procedure Laterality Date  . AMPUTATION Left 12/30/2012   Procedure: AMPUTATION RAY ;  Surgeon: Meredith Pel, MD;  [facility];  Service: Orthopedics;  Laterality: Left;  LEFT GREAT TOE RAY AMPUTATION  . AMPUTATION Left 02/23/2016   Procedure: Left Below Knee Amputation;  Surgeon: [name], MD;  Location: [facility];  Service: Orthopedics;  Laterality: Left;  . APPENDECTOMY    . BASAL CELL CARCINOMA EXCISION  2/16   left forearm  . CARDIAC CATHETERIZATION  1998   Negative  . CATARACT EXTRACTION Right 2017   then lid surgery  . CORONARY ARTERY BYPASS GRAFT  09/2005   Post op AFIB  . EYE SURGERY    . FOOT BONE EXCISION Left 11/03/2013   DR DUDA   . I&D EXTREMITY Left 11/03/2013   Procedure: Left Foot Partial Bone Excision Cuboid and Medial Cuneiform, Wound Closures;  Surgeon: [name], MD;  Location: [facility];  Service: Orthopedics;  Laterality: Left;  . INSERTION PROSTATE RADIATION SEED  2009   RT and seeds for prostate cancer  . KIDNEY STONE SURGERY  04/1993  . RCA stents  04/2003   EF 55%  . RETINAL DETACHMENT SURGERY  2002-2003  . thrombosed vein  1993   Right leg   Social History   Occupational History  . Heavy equipment mechanic--retired    Social History  Main Topics  . Smoking status: Former Smoker    Packs/day: 2.00    Years: 35.00    Types: Cigars, Cigarettes    Quit date: 01/07/2001  . Smokeless tobacco: Never Used  . Alcohol use Yes     Comment: rare  . Drug use: No  . Sexual activity: Yes    Partners: Female

## 2016-03-20 DIAGNOSIS — Z89512 Acquired absence of left leg below knee: Secondary | ICD-10-CM | POA: Diagnosis not present

## 2016-03-20 DIAGNOSIS — Z4781 Encounter for orthopedic aftercare following surgical amputation: Secondary | ICD-10-CM | POA: Diagnosis not present

## 2016-03-20 DIAGNOSIS — E11610 Type 2 diabetes mellitus with diabetic neuropathic arthropathy: Secondary | ICD-10-CM | POA: Diagnosis not present

## 2016-03-20 DIAGNOSIS — E669 Obesity, unspecified: Secondary | ICD-10-CM | POA: Diagnosis not present

## 2016-03-20 DIAGNOSIS — Z794 Long term (current) use of insulin: Secondary | ICD-10-CM | POA: Diagnosis not present

## 2016-03-20 DIAGNOSIS — Z5181 Encounter for therapeutic drug level monitoring: Secondary | ICD-10-CM | POA: Diagnosis not present

## 2016-03-21 DIAGNOSIS — Z89512 Acquired absence of left leg below knee: Secondary | ICD-10-CM | POA: Diagnosis not present

## 2016-03-21 DIAGNOSIS — Z4781 Encounter for orthopedic aftercare following surgical amputation: Secondary | ICD-10-CM | POA: Diagnosis not present

## 2016-03-21 DIAGNOSIS — E669 Obesity, unspecified: Secondary | ICD-10-CM | POA: Diagnosis not present

## 2016-03-21 DIAGNOSIS — E11610 Type 2 diabetes mellitus with diabetic neuropathic arthropathy: Secondary | ICD-10-CM | POA: Diagnosis not present

## 2016-03-21 DIAGNOSIS — Z5181 Encounter for therapeutic drug level monitoring: Secondary | ICD-10-CM | POA: Diagnosis not present

## 2016-03-21 DIAGNOSIS — Z794 Long term (current) use of insulin: Secondary | ICD-10-CM | POA: Diagnosis not present

## 2016-03-25 DIAGNOSIS — E669 Obesity, unspecified: Secondary | ICD-10-CM | POA: Diagnosis not present

## 2016-03-25 DIAGNOSIS — Z4781 Encounter for orthopedic aftercare following surgical amputation: Secondary | ICD-10-CM | POA: Diagnosis not present

## 2016-03-25 DIAGNOSIS — Z794 Long term (current) use of insulin: Secondary | ICD-10-CM | POA: Diagnosis not present

## 2016-03-25 DIAGNOSIS — Z89512 Acquired absence of left leg below knee: Secondary | ICD-10-CM | POA: Diagnosis not present

## 2016-03-25 DIAGNOSIS — Z5181 Encounter for therapeutic drug level monitoring: Secondary | ICD-10-CM | POA: Diagnosis not present

## 2016-03-25 DIAGNOSIS — E11610 Type 2 diabetes mellitus with diabetic neuropathic arthropathy: Secondary | ICD-10-CM | POA: Diagnosis not present

## 2016-03-26 ENCOUNTER — Ambulatory Visit (INDEPENDENT_AMBULATORY_CARE_PROVIDER_SITE_OTHER): Payer: Medicare Other | Admitting: Orthopedic Surgery

## 2016-03-26 ENCOUNTER — Telehealth (INDEPENDENT_AMBULATORY_CARE_PROVIDER_SITE_OTHER): Payer: Self-pay | Admitting: Orthopedic Surgery

## 2016-03-26 DIAGNOSIS — Z89512 Acquired absence of left leg below knee: Secondary | ICD-10-CM

## 2016-03-26 DIAGNOSIS — Z794 Long term (current) use of insulin: Secondary | ICD-10-CM | POA: Diagnosis not present

## 2016-03-26 DIAGNOSIS — Z5181 Encounter for therapeutic drug level monitoring: Secondary | ICD-10-CM | POA: Diagnosis not present

## 2016-03-26 DIAGNOSIS — Z4781 Encounter for orthopedic aftercare following surgical amputation: Secondary | ICD-10-CM | POA: Diagnosis not present

## 2016-03-26 DIAGNOSIS — E11610 Type 2 diabetes mellitus with diabetic neuropathic arthropathy: Secondary | ICD-10-CM | POA: Diagnosis not present

## 2016-03-26 DIAGNOSIS — E669 Obesity, unspecified: Secondary | ICD-10-CM | POA: Diagnosis not present

## 2016-03-26 MED ORDER — DOXYCYCLINE HYCLATE 100 MG PO TABS: 100.0000 mg | ORAL_TABLET | Freq: Two times a day (BID) | ORAL | 0 refills | Status: DC

## 2016-03-26 NOTE — Telephone Encounter (Signed)
PHYS THERAPIST STACEY DAVID IS CONCERNED ABOUT PT WOUND, AND WANTS TO KNOW WHAT YOU WANT TO DO AS FAR AS DRESSING IT.  [phone]

## 2016-03-26 NOTE — Telephone Encounter (Signed)
Pt is in office today and will give instruction at visit.

## 2016-03-26 NOTE — Progress Notes (Signed)
Office Visit Note   Patient: [name]           Date of Birth: 03-30-1950           MRN: [id number] Visit Date: 03/26/2016              Requested by: [name], MD [place], [place] [address] PCP: Viviana Simpler, MD  Chief Complaint  Patient presents with  . Left Leg - Follow-up    Left BKA 02/23/16 4 weeks 4 days post op.     OBS:JGGEZMO states she's noticed some increasing redness and a little bit of bloody drainage laterally over the amputation. He is currently wearing the medical compression stump shrinker. HPI  Assessment & Plan: Visit Diagnoses:  1. Status post below knee amputation of left lower extremity ([place])     Plan: We'll start patient on doxycycline continue with dialysis of cleansing the granulation tissue was touched with silver nitrate follow-up as scheduled in 2 weeks.  Follow-Up Instructions: Return in about 2 weeks (around 04/09/2016).   Ortho Exam Examination there is no purulence there is some mild cellulitis medially and laterally however there is no tenderness to palpation there is no drainage no purulence. ROS: Complete review of systems negative no fever or chills. Imaging: No results found.  Labs: Lab Results  Component Value Date   HGBA1C 6.1 02/14/2016   HGBA1C 7.7 12/04/2015   HGBA1C 8.0 08/31/2015   ESRSEDRATE 35 (H) 03/31/2013   CRP 5.3 (H) 03/31/2013   REPTSTATUS 01/02/2013 FINAL 12/30/2012   GRAMSTAIN  12/30/2012    FEW WBC PRESENT, PREDOMINANTLY PMN RARE SQUAMOUS EPITHELIAL CELLS PRESENT ABUNDANT GRAM NEGATIVE RODS MODERATE GRAM POSITIVE COCCI IN PAIRS IN CLUSTERS   CULT  12/30/2012    MULTIPLE ORGANISMS PRESENT, NONE PREDOMINANT NO STAPHYLOCOCCUS AUREUS ISOLATED NO GROUP A STREP (S.PYOGENES) ISOLATED Performed at [facility] 12/15/2012   LABORGA PSEUDOMONAS AERUGINOSA 12/15/2012    Orders:  No orders of the defined types were placed in this  encounter.  Meds ordered this encounter  Medications  . doxycycline (VIBRA-TABS) 100 MG tablet    Sig: Take 1 tablet (100 mg total) by mouth 2 (two) times daily.    Dispense:  60 tablet    Refill:  0     Procedures: No procedures performed  Clinical Data: No additional findings.  Subjective: Review of Systems  Objective: Vital Signs: There were no vitals taken for this visit.  Specialty Comments:  No specialty comments available.  PMFS History: Patient Active Problem List   Diagnosis Date Noted  . Status post below knee amputation of left lower extremity ([place]) 02/23/2016  . Type 2 diabetes mellitus with diabetic polyneuropathy, with long-term current use of insulin ([place]) 03/29/2015  . Advance directive discussed with patient 02/07/2014  . Normocytic anemia 04/01/2013  . Diabetic Charcot foot ([place]) 04/01/2013  . Status post amputation of toe of left foot ([place]) 01/19/2013  . BMI 40.0-44.9, adult ([place]) 12/30/2012  . Subacute osteomyelitis, left ankle and foot ([place]) 12/30/2012  . Routine general medical examination at a health care facility 12/25/2010  . HEMORRHOIDS-INTERNAL 11/22/2009  . PERSONAL HX COLONIC POLYPS 11/22/2009  . VENOUS INSUFFICIENCY 06/29/2008  . Osteoarthritis, multiple sites 04/03/2007  . ERECTILE DYSFUNCTION, ORGANIC 06/13/2006  . Hyperlipemia 06/11/2006  . GLAUCOMA 06/11/2006  . Coronary atherosclerosis of native coronary artery 06/11/2006  . Obstructive sleep apnea 06/11/2006   Past Medical History:  Diagnosis Date  .  Coronary atherosclerosis of unspecified type of vessel, native or graft   . Diabetes mellitus without complication ([place])   . Hemorrhage of rectum and anus   . History of kidney stones   . Impotence of organic origin   . Internal hemorrhoids without mention of complication   . Malignant neoplasm of prostate ([place])   . Mononeuritis of unspecified site   . Osteoarthrosis, unspecified whether generalized or localized, unspecified  site   . Other and unspecified hyperlipidemia   . Personal history of colonic polyps   . Personal history of diabetic foot ulcer    saw wound center, resolved 05/08/2010  . Personal history of gallstones   . Routine general medical examination at a health care facility   . Type II or unspecified type diabetes mellitus with neurological manifestations, not stated as uncontrolled(250.60)   . Unspecified glaucoma(365.9)   . Unspecified sleep apnea    cpcp  . Unspecified venous (peripheral) insufficiency     Family History  Problem Relation Age of Onset  . Lung cancer Father   . Multiple sclerosis Mother   . Heart attack      paternal aunts and uncles  . Peripheral vascular disease Maternal Grandfather     several amputations    Past Surgical History:  Procedure Laterality Date  . AMPUTATION Left 12/30/2012   Procedure: AMPUTATION RAY ;  Surgeon: Meredith Pel, MD;  [facility];  Service: Orthopedics;  Laterality: Left;  LEFT GREAT TOE RAY AMPUTATION  . AMPUTATION Left 02/23/2016   Procedure: Left Below Knee Amputation;  Surgeon: [name], MD;  Location: [facility];  Service: Orthopedics;  Laterality: Left;  . APPENDECTOMY    . BASAL CELL CARCINOMA EXCISION  2/16   left forearm  . CARDIAC CATHETERIZATION  1998   Negative  . CATARACT EXTRACTION Right 2017   then lid surgery  . CORONARY ARTERY BYPASS GRAFT  09/2005   Post op AFIB  . EYE SURGERY    . FOOT BONE EXCISION Left 11/03/2013   DR Heyli Min   . I&D EXTREMITY Left 11/03/2013   Procedure: Left Foot Partial Bone Excision Cuboid and Medial Cuneiform, Wound Closures;  Surgeon: [name], MD;  Location: [facility];  Service: Orthopedics;  Laterality: Left;  . INSERTION PROSTATE RADIATION SEED  2009   RT and seeds for prostate cancer  . KIDNEY STONE SURGERY  04/1993  . RCA stents  04/2003   EF 55%  . RETINAL DETACHMENT SURGERY  2002-2003  . thrombosed vein  1993   Right leg   Social History   Occupational History  .  Heavy equipment mechanic--retired    Social History Main Topics  . Smoking status: Former Smoker    Packs/day: 2.00    Years: 35.00    Types: Cigars, Cigarettes    Quit date: 01/07/2001  . Smokeless tobacco: Never Used  . Alcohol use Yes     Comment: rare  . Drug use: No  . Sexual activity: Yes    Partners: Female

## 2016-04-02 ENCOUNTER — Ambulatory Visit (INDEPENDENT_AMBULATORY_CARE_PROVIDER_SITE_OTHER): Payer: Medicare Other | Admitting: Internal Medicine

## 2016-04-02 ENCOUNTER — Encounter: Payer: Self-pay | Admitting: Internal Medicine

## 2016-04-02 VITALS — BP 114/80 | HR 78 | Temp 97.9°F | Wt 293.0 lb

## 2016-04-02 DIAGNOSIS — I2510 Atherosclerotic heart disease of native coronary artery without angina pectoris: Secondary | ICD-10-CM | POA: Diagnosis not present

## 2016-04-02 DIAGNOSIS — Z89512 Acquired absence of left leg below knee: Secondary | ICD-10-CM

## 2016-04-02 NOTE — Progress Notes (Signed)
Subjective:    Patient ID: [name], male    DOB: December 20, 1950, 66 y.o.   MRN: [id number]  HPI Here for hospital follow up With girlfriend--she is living with him now to give him assistance Had left BKA last month by Dr [name] He had been having such chronic problems with the foot--he is not having an emotional problem Is on antibiotic from Dr [name] now--has mild redness  Healing fairly well Still with one healing area Continues to work with smaller compression socks Has had visits with prosthetic person--waiting till it is completely healed  Transfers without help Using walker on ramp and in house--can't do steps Getting home PT/OT still  Current Outpatient Prescriptions on File Prior to Visit  Medication Sig Dispense Refill  . aspirin 325 MG tablet Take 325 mg by mouth daily.      . B-D UF III MINI PEN NEEDLES 31G X 5 MM MISC USE TO INJECT INSULIN 4 TIMES DAILY AS INSTRUCTED FOR DIABETES 360 each 2  . carvedilol (COREG) 25 MG tablet TAKE 1 TABLET TWICE DAILY 180 tablet 3  . doxycycline (VIBRA-TABS) 100 MG tablet Take 1 tablet (100 mg total) by mouth 2 (two) times daily. 60 tablet 0  . erythromycin ophthalmic ointment     . FREESTYLE INSULINX TEST test strip 1 each by Other route See admin instructions. Check blood sugar 3 times daily.    Marland Kitchen losartan-hydrochlorothiazide (HYZAAR) 50-12.5 MG tablet TAKE 1 TABLET EVERY DAY 90 tablet 3  . metFORMIN (GLUCOPHAGE) 1000 MG tablet TAKE 1 TABLET TWICE DAILY 180 tablet 1  . naproxen sodium (ANAPROX) 220 MG tablet Take 440 mg by mouth 2 (two) times daily as needed (for pain.). For pain    . NOVOLOG FLEXPEN 100 UNIT/ML FlexPen INJECT  30  TO  35 UNITS SUBCUTANEOUSLY THREE TIMES DAILY WITH MEALS (Patient taking differently: INJECT  25 UNITS SUBCUTANEOUSLY THREE TIMES DAILY WITH MEALS) 90 mL 1  . oxyCODONE-acetaminophen (ROXICET) 5-325 MG tablet Take 1 tablet by mouth every 4 (four) hours as needed for severe pain. 60 tablet 0  . potassium  citrate (UROCIT-K) 10 MEQ (1080 MG) SR tablet Take 10 mEq by mouth 2 (two) times daily.   12  . Probiotic Product (PROBIOTIC DAILY PO) Take 1 capsule by mouth daily.    . TOUJEO SOLOSTAR 300 UNIT/ML SOPN INJECT 60 UNITS INTO THE SKIN 2 (TWO) TIMES DAILY. 24 pen 1  . triamcinolone (KENALOG) 0.025 % cream APPLY 1 APPLICATION TOPICALLY 3 (THREE) TIMES DAILY AS NEEDED. 30 g 3   No current facility-administered medications on file prior to visit.     Allergies  Allergen Reactions  . [name] Other (See Comments)    REACTION: myalgias  . [name] Other (See Comments)    Body ache  . [name] Other (See Comments)    Body ache    Past Medical History:  Diagnosis Date  . Coronary atherosclerosis of unspecified type of vessel, native or graft   . Diabetes mellitus without complication ([place])   . Hemorrhage of rectum and anus   . History of kidney stones   . Impotence of organic origin   . Internal hemorrhoids without mention of complication   . Malignant neoplasm of prostate ([place])   . Mononeuritis of unspecified site   . Osteoarthrosis, unspecified whether generalized or localized, unspecified site   . Other and unspecified hyperlipidemia   . Personal history of colonic polyps   . Personal history of diabetic foot ulcer  saw wound center, resolved 05/08/2010  . Personal history of gallstones   . Routine general medical examination at a health care facility   . Type II or unspecified type diabetes mellitus with neurological manifestations, not stated as uncontrolled(250.60)   . Unspecified glaucoma(365.9)   . Unspecified sleep apnea    cpcp  . Unspecified venous (peripheral) insufficiency     Past Surgical History:  Procedure Laterality Date  . AMPUTATION Left 12/30/2012   Procedure: AMPUTATION RAY ;  Surgeon: Martin Pel, [name];  [facility];  Service: Orthopedics;  Laterality: Left;  LEFT GREAT TOE RAY AMPUTATION  . AMPUTATION Left 02/23/2016   Procedure: Left  Below Knee Amputation;  Surgeon: Martin Minion, [name];  Location: [facility];  Service: Orthopedics;  Laterality: Left;  . APPENDECTOMY    . BASAL CELL CARCINOMA EXCISION  2/16   left forearm  . CARDIAC CATHETERIZATION  1998   Negative  . CATARACT EXTRACTION Right 2017   then lid surgery  . CORONARY ARTERY BYPASS GRAFT  09/2005   Post op AFIB  . EYE SURGERY    . FOOT BONE EXCISION Left 11/03/2013   DR [name]   . I&D EXTREMITY Left 11/03/2013   Procedure: Left Foot Partial Bone Excision Cuboid and Medial Cuneiform, Wound Closures;  Surgeon: Martin Minion, [name];  Location: [facility];  Service: Orthopedics;  Laterality: Left;  . INSERTION PROSTATE RADIATION SEED  2009   RT and seeds for prostate cancer  . KIDNEY STONE SURGERY  04/1993  . RCA stents  04/2003   EF 55%  . RETINAL DETACHMENT SURGERY  2002-2003  . thrombosed vein  1993   Right leg    Family History  Problem Relation Age of Onset  . Lung cancer Father   . Multiple sclerosis Mother   . Heart attack      paternal aunts and uncles  . Peripheral vascular disease Maternal Grandfather     several amputations    Social History   Social History  . Marital status: Widowed    Spouse name: N/A  . Number of children: 3  . Years of education: N/A   Occupational History  . Heavy equipment mechanic--retired    Social History Main Topics  . Smoking status: Former Smoker    Packs/day: 2.00    Years: 35.00    Types: Cigars, Cigarettes    Quit date: 01/07/2001  . Smokeless tobacco: Never Used  . Alcohol use Yes     Comment: rare  . Drug use: No  . Sexual activity: Yes    Partners: Female   Other Topics Concern  . Not on file   Social History Narrative   Has living will   Daughter [name] is [facility] care POA    Would accept resuscitation attempts--but no prolonged artificial ventilation   No tube feeds if cognitively unaware   Review of Systems  Diabetes well controlled--sees Dr [name] BP has been fine Appetite is  good Sleeping well     Objective:   Physical Exam  Constitutional: No distress.  Skin:  Multiple eschar at amputations site Slight redness that doesn't look infectious at both medial and lateral eschars Not much edema  Psychiatric: He has a normal mood and affect. His behavior is normal.          Assessment & Plan:

## 2016-04-02 NOTE — Progress Notes (Signed)
Pre visit review using our clinic review tool, if applicable. No additional management support is needed unless otherwise documented below in the visit note. 

## 2016-04-02 NOTE — Assessment & Plan Note (Signed)
Doing well Ongoing compression efforts and working with prosthetic person No infection now Doing well functionally and emotionally with it

## 2016-04-04 DIAGNOSIS — E11610 Type 2 diabetes mellitus with diabetic neuropathic arthropathy: Secondary | ICD-10-CM | POA: Diagnosis not present

## 2016-04-04 DIAGNOSIS — Z4781 Encounter for orthopedic aftercare following surgical amputation: Secondary | ICD-10-CM | POA: Diagnosis not present

## 2016-04-04 DIAGNOSIS — Z5181 Encounter for therapeutic drug level monitoring: Secondary | ICD-10-CM | POA: Diagnosis not present

## 2016-04-04 DIAGNOSIS — Z89512 Acquired absence of left leg below knee: Secondary | ICD-10-CM | POA: Diagnosis not present

## 2016-04-04 DIAGNOSIS — Z794 Long term (current) use of insulin: Secondary | ICD-10-CM | POA: Diagnosis not present

## 2016-04-04 DIAGNOSIS — E669 Obesity, unspecified: Secondary | ICD-10-CM | POA: Diagnosis not present

## 2016-04-09 ENCOUNTER — Ambulatory Visit (INDEPENDENT_AMBULATORY_CARE_PROVIDER_SITE_OTHER): Payer: Medicare Other | Admitting: Orthopedic Surgery

## 2016-04-09 ENCOUNTER — Encounter (INDEPENDENT_AMBULATORY_CARE_PROVIDER_SITE_OTHER): Payer: Self-pay | Admitting: Orthopedic Surgery

## 2016-04-09 DIAGNOSIS — Z794 Long term (current) use of insulin: Secondary | ICD-10-CM

## 2016-04-09 DIAGNOSIS — E1142 Type 2 diabetes mellitus with diabetic polyneuropathy: Secondary | ICD-10-CM

## 2016-04-09 DIAGNOSIS — Z89512 Acquired absence of left leg below knee: Secondary | ICD-10-CM

## 2016-04-09 NOTE — Progress Notes (Signed)
Office Visit Note   Patient: [name]           Date of Birth: December 03, 1950           MRN: [id number] Visit Date: 04/09/2016              Requested by: [name], MD [place], [place] [address] PCP: Viviana Simpler, MD  Chief Complaint  Patient presents with  . Left Leg - Routine Post Op      HPI: Patient status post left transtibial amputation. He is still on his oral antibiotics he is using the 3XL shrinker and is going to follow up this week with [employer] for prosthetic evaluation. Patient states that he did fall once adequate wheelchair.  Assessment & Plan: Visit Diagnoses:  1. Type 2 diabetes mellitus with diabetic polyneuropathy, with long-term current use of insulin ([place])   2. Status post below knee amputation of left lower extremity ([place])     Plan: Discontinue his antibiotics at this time continue Dial soap cleansing start skin moisturizing with either lanolin or shea butter    Follow-Up Instructions: Return in about 4 weeks (around 05/07/2016).   Ortho Exam  Patient is alert, oriented, no adenopathy, well-dressed, normal affect, normal respiratory effort. Patient is a Investment banker, operational in a wheelchair. Examination the incision is healing nicely there is a few scabs there is some dry skin there is no cellulitis no drainage no tenderness to palpation there is good consolidation of the residual limb. The 3 XL shrinker is fitting loosely.  Imaging: No results found.  Labs: Lab Results  Component Value Date   HGBA1C 6.1 02/14/2016   HGBA1C 7.7 12/04/2015   HGBA1C 8.0 08/31/2015   ESRSEDRATE 35 (H) 03/31/2013   CRP 5.3 (H) 03/31/2013   REPTSTATUS 01/02/2013 FINAL 12/30/2012   GRAMSTAIN  12/30/2012    FEW WBC PRESENT, PREDOMINANTLY PMN RARE SQUAMOUS EPITHELIAL CELLS PRESENT ABUNDANT GRAM NEGATIVE RODS MODERATE GRAM POSITIVE COCCI IN PAIRS IN CLUSTERS   CULT  12/30/2012    MULTIPLE ORGANISMS PRESENT, NONE PREDOMINANT NO STAPHYLOCOCCUS AUREUS  ISOLATED NO GROUP A STREP (S.PYOGENES) ISOLATED Performed at [facility] 12/15/2012   LABORGA PSEUDOMONAS AERUGINOSA 12/15/2012    Orders:  No orders of the defined types were placed in this encounter.  No orders of the defined types were placed in this encounter.    Procedures: No procedures performed  Clinical Data: No additional findings.  ROS:  All other systems negative, except as noted in the HPI. Review of Systems  Objective: Vital Signs: There were no vitals taken for this visit.  Specialty Comments:  No specialty comments available.  PMFS History: Patient Active Problem List   Diagnosis Date Noted  . Status post below knee amputation of left lower extremity ([place]) 02/23/2016  . Type 2 diabetes mellitus with diabetic polyneuropathy, with long-term current use of insulin ([place]) 03/29/2015  . Advance directive discussed with patient 02/07/2014  . Normocytic anemia 04/01/2013  . Diabetic Charcot foot ([place]) 04/01/2013  . BMI 40.0-44.9, adult ([place]) 12/30/2012  . Routine general medical examination at a health care facility 12/25/2010  . HEMORRHOIDS-INTERNAL 11/22/2009  . PERSONAL HX COLONIC POLYPS 11/22/2009  . VENOUS INSUFFICIENCY 06/29/2008  . Osteoarthritis, multiple sites 04/03/2007  . ERECTILE DYSFUNCTION, ORGANIC 06/13/2006  . Hyperlipemia 06/11/2006  . GLAUCOMA 06/11/2006  . Coronary atherosclerosis of native coronary artery 06/11/2006  . Obstructive sleep apnea 06/11/2006   Past Medical History:  Diagnosis Date  . Coronary atherosclerosis of unspecified type of vessel, native or graft   . Diabetes mellitus without complication ([place])   . Hemorrhage of rectum and anus   . History of kidney stones   . Impotence of organic origin   . Internal hemorrhoids without mention of complication   . Malignant neoplasm of prostate ([place])   . Mononeuritis of unspecified site   . Osteoarthrosis, unspecified whether generalized or  localized, unspecified site   . Other and unspecified hyperlipidemia   . Personal history of colonic polyps   . Personal history of diabetic foot ulcer    saw wound center, resolved 05/08/2010  . Personal history of gallstones   . Routine general medical examination at a health care facility   . Type II or unspecified type diabetes mellitus with neurological manifestations, not stated as uncontrolled(250.60)   . Unspecified glaucoma(365.9)   . Unspecified sleep apnea    cpcp  . Unspecified venous (peripheral) insufficiency     Family History  Problem Relation Age of Onset  . Lung cancer Father   . Multiple sclerosis Mother   . Heart attack      paternal aunts and uncles  . Peripheral vascular disease Maternal Grandfather     several amputations    Past Surgical History:  Procedure Laterality Date  . AMPUTATION Left 12/30/2012   Procedure: AMPUTATION RAY ;  Surgeon: Meredith Pel, MD;  [facility];  Service: Orthopedics;  Laterality: Left;  LEFT GREAT TOE RAY AMPUTATION  . AMPUTATION Left 02/23/2016   Procedure: Left Below Knee Amputation;  Surgeon: [name], MD;  Location: [facility];  Service: Orthopedics;  Laterality: Left;  . APPENDECTOMY    . BASAL CELL CARCINOMA EXCISION  2/16   left forearm  . CARDIAC CATHETERIZATION  1998   Negative  . CATARACT EXTRACTION Right 2017   then lid surgery  . CORONARY ARTERY BYPASS GRAFT  09/2005   Post op AFIB  . EYE SURGERY    . FOOT BONE EXCISION Left 11/03/2013   DR Keontae Levingston   . I&D EXTREMITY Left 11/03/2013   Procedure: Left Foot Partial Bone Excision Cuboid and Medial Cuneiform, Wound Closures;  Surgeon: [name], MD;  Location: [facility];  Service: Orthopedics;  Laterality: Left;  . INSERTION PROSTATE RADIATION SEED  2009   RT and seeds for prostate cancer  . KIDNEY STONE SURGERY  04/1993  . RCA stents  04/2003   EF 55%  . RETINAL DETACHMENT SURGERY  2002-2003  . thrombosed vein  1993   Right leg   Social History    Occupational History  . Heavy equipment mechanic--retired    Social History Main Topics  . Smoking status: Former Smoker    Packs/day: 2.00    Years: 35.00    Types: Cigars, Cigarettes    Quit date: 01/07/2001  . Smokeless tobacco: Never Used  . Alcohol use Yes     Comment: rare  . Drug use: No  . Sexual activity: Yes    Partners: Female

## 2016-04-10 ENCOUNTER — Other Ambulatory Visit: Payer: Self-pay | Admitting: Internal Medicine

## 2016-04-11 DIAGNOSIS — Z794 Long term (current) use of insulin: Secondary | ICD-10-CM | POA: Diagnosis not present

## 2016-04-11 DIAGNOSIS — Z5181 Encounter for therapeutic drug level monitoring: Secondary | ICD-10-CM | POA: Diagnosis not present

## 2016-04-11 DIAGNOSIS — Z89512 Acquired absence of left leg below knee: Secondary | ICD-10-CM | POA: Diagnosis not present

## 2016-04-11 DIAGNOSIS — E669 Obesity, unspecified: Secondary | ICD-10-CM | POA: Diagnosis not present

## 2016-04-11 DIAGNOSIS — Z4781 Encounter for orthopedic aftercare following surgical amputation: Secondary | ICD-10-CM | POA: Diagnosis not present

## 2016-04-11 DIAGNOSIS — E11610 Type 2 diabetes mellitus with diabetic neuropathic arthropathy: Secondary | ICD-10-CM | POA: Diagnosis not present

## 2016-05-07 ENCOUNTER — Telehealth (INDEPENDENT_AMBULATORY_CARE_PROVIDER_SITE_OTHER): Payer: Self-pay | Admitting: Orthopedic Surgery

## 2016-05-07 ENCOUNTER — Encounter (INDEPENDENT_AMBULATORY_CARE_PROVIDER_SITE_OTHER): Payer: Self-pay | Admitting: Orthopedic Surgery

## 2016-05-07 ENCOUNTER — Ambulatory Visit (INDEPENDENT_AMBULATORY_CARE_PROVIDER_SITE_OTHER): Payer: Medicare Other | Admitting: Orthopedic Surgery

## 2016-05-07 DIAGNOSIS — L97511 Non-pressure chronic ulcer of other part of right foot limited to breakdown of skin: Secondary | ICD-10-CM

## 2016-05-07 DIAGNOSIS — Z89512 Acquired absence of left leg below knee: Secondary | ICD-10-CM

## 2016-05-07 NOTE — Progress Notes (Signed)
Post-Op Visit Note   Patient: [name]           Date of Birth: [birth year]           MRN: [id number] Visit Date: 05/07/2016 PCP: Viviana Simpler, MD  Chief Complaint:  Chief Complaint  Patient presents with  . Right Great Toe - Wound Check  . Left Leg - Follow-up    LT BKA    HPI:  The patient is a 66 year old gentleman who presents today status post left below the knee amputation on February 16. He's been following with [employer] for fabrication of this prosthesis. States he has also been working with [facility] neuro rehabilitation. He has been casted for his prosthetic. There are a few small areas that have yet to heal. Also complaining of a new ulceration to his right great toe. States that he come out of his bathroom tile getting out of the shower. Has been doing Neosporin dressing changes daily. Is wearing a TED hose on the right lower extremity    Ortho Exam Left below the knee amputation is well consolidated. There are 3 remaining areas that are covered with eschar these are just 1 cm in diameter there is no depth there is no straining erythema noted drainage no odor no sign of infection the right great toe plantar aspect there is a ulceration this is 15 mm in diameter there is beefy granulation tissue in the wound bed there is no depth no drainage no surrounding erythema odor or sign of infection  Visit Diagnoses:  1. Status post below knee amputation of left lower extremity ([place])   2. Skin ulcer of right great toe, limited to breakdown of skin ([place])     Plan: Continue with physical therapy. We'll provide an order for gait training. We'll follow-up in office in 2 months. May call or return should any concerns arise.  Follow-Up Instructions: Return in about 2 months (around 07/07/2016).   Imaging: No results found.  Orders:  No orders of the defined types were placed in this encounter.  No orders of the defined types were placed in this encounter.    PMFS  History: Patient Active Problem List   Diagnosis Date Noted  . Status post below knee amputation of left lower extremity ([place]) 02/23/2016  . Type 2 diabetes mellitus with diabetic polyneuropathy, with long-term current use of insulin ([place]) 03/29/2015  . Advance directive discussed with patient 02/07/2014  . Normocytic anemia 04/01/2013  . Diabetic Charcot foot ([place]) 04/01/2013  . BMI 40.0-44.9, adult ([place]) 12/30/2012  . Routine general medical examination at a health care facility 12/25/2010  . HEMORRHOIDS-INTERNAL 11/22/2009  . PERSONAL HX COLONIC POLYPS 11/22/2009  . VENOUS INSUFFICIENCY 06/29/2008  . Osteoarthritis, multiple sites 04/03/2007  . ERECTILE DYSFUNCTION, ORGANIC 06/13/2006  . Hyperlipemia 06/11/2006  . GLAUCOMA 06/11/2006  . Coronary atherosclerosis of native coronary artery 06/11/2006  . Obstructive sleep apnea 06/11/2006   Past Medical History:  Diagnosis Date  . Coronary atherosclerosis of unspecified type of vessel, native or graft   . Diabetes mellitus without complication ([place])   . Hemorrhage of rectum and anus   . History of kidney stones   . Impotence of organic origin   . Internal hemorrhoids without mention of complication   . Malignant neoplasm of prostate ([place])   . Mononeuritis of unspecified site   . Osteoarthrosis, unspecified whether generalized or localized, unspecified site   . Other and unspecified hyperlipidemia   . Personal history of colonic polyps   .  Personal history of diabetic foot ulcer    saw wound center, resolved 05/08/2010  . Personal history of gallstones   . Routine general medical examination at a health care facility   . Type II or unspecified type diabetes mellitus with neurological manifestations, not stated as uncontrolled(250.60)   . Unspecified glaucoma(365.9)   . Unspecified sleep apnea    cpcp  . Unspecified venous (peripheral) insufficiency     Family History  Problem Relation Age of Onset  . Lung cancer Father   .  Multiple sclerosis Mother   . Heart attack      paternal aunts and uncles  . Peripheral vascular disease Maternal Grandfather     several amputations    Past Surgical History:  Procedure Laterality Date  . AMPUTATION Left 12/30/2012   Procedure: AMPUTATION RAY ;  Surgeon: Meredith Pel, MD;  [facility];  Service: Orthopedics;  Laterality: Left;  LEFT GREAT TOE RAY AMPUTATION  . AMPUTATION Left 02/23/2016   Procedure: Left Below Knee Amputation;  Surgeon: [name], MD;  Location: [facility];  Service: Orthopedics;  Laterality: Left;  . APPENDECTOMY    . BASAL CELL CARCINOMA EXCISION  2/16   left forearm  . CARDIAC CATHETERIZATION  1998   Negative  . CATARACT EXTRACTION Right 2017   then lid surgery  . CORONARY ARTERY BYPASS GRAFT  09/2005   Post op AFIB  . EYE SURGERY    . FOOT BONE EXCISION Left 11/03/2013   DR DUDA   . I&D EXTREMITY Left 11/03/2013   Procedure: Left Foot Partial Bone Excision Cuboid and Medial Cuneiform, Wound Closures;  Surgeon: [name], MD;  Location: [facility];  Service: Orthopedics;  Laterality: Left;  . INSERTION PROSTATE RADIATION SEED  2009   RT and seeds for prostate cancer  . KIDNEY STONE SURGERY  04/1993  . RCA stents  04/2003   EF 55%  . RETINAL DETACHMENT SURGERY  2002-2003  . thrombosed vein  1993   Right leg   Social History   Occupational History  . Heavy equipment mechanic--retired    Social History Main Topics  . Smoking status: Former Smoker    Packs/day: 2.00    Years: 35.00    Types: Cigars, Cigarettes    Quit date: 01/07/2001  . Smokeless tobacco: Never Used  . Alcohol use Yes     Comment: rare  . Drug use: No  . Sexual activity: Yes    Partners: Female

## 2016-05-07 NOTE — Telephone Encounter (Signed)
Faxed to [place]. And placed order for [facility]. [phone] is phone number.

## 2016-05-07 NOTE — Telephone Encounter (Signed)
[employer] needs order for phys therapy for gate training please.  [phone]

## 2016-05-10 DIAGNOSIS — N481 Balanitis: Secondary | ICD-10-CM | POA: Diagnosis not present

## 2016-05-13 ENCOUNTER — Encounter: Payer: Self-pay | Admitting: Internal Medicine

## 2016-05-13 ENCOUNTER — Ambulatory Visit (INDEPENDENT_AMBULATORY_CARE_PROVIDER_SITE_OTHER): Payer: Medicare Other | Admitting: Internal Medicine

## 2016-05-13 VITALS — BP 140/88 | HR 77

## 2016-05-13 DIAGNOSIS — Z794 Long term (current) use of insulin: Secondary | ICD-10-CM | POA: Diagnosis not present

## 2016-05-13 DIAGNOSIS — E1142 Type 2 diabetes mellitus with diabetic polyneuropathy: Secondary | ICD-10-CM | POA: Diagnosis not present

## 2016-05-13 LAB — POCT GLYCOSYLATED HEMOGLOBIN (HGB A1C): Hemoglobin A1C: 7.3

## 2016-05-13 MED ORDER — INSULIN GLARGINE 300 UNIT/ML ~~LOC~~ SOPN: 50.0000 [IU] | PEN_INJECTOR | Freq: Two times a day (BID) | SUBCUTANEOUS | 3 refills | Status: DC

## 2016-05-13 MED ORDER — GLUCOSE BLOOD VI STRP: 1.0000 | ORAL_STRIP | 5 refills | Status: DC

## 2016-05-13 NOTE — Patient Instructions (Addendum)
Please continue: - Metformin 1000 mg with dinner - Toujeo 50 units at bedtime (try to decrease back to 40 units if you start back on the diet) - Humalog:   30 units before a smaller meal (please decrease to 20 units if you start back on the diet)  35 units before a larger meal (please decrease to 25 units if you start back on the diet) Try to take 10-15 units Humalog before lunch (may not need this if you start the diet) - Humalog Sliding scale: - 150-175: + 3 unit  - 176-200: + 5 units  - > 200: + 7 units    Please return in 3 months with your sugar log.

## 2016-05-13 NOTE — Progress Notes (Signed)
Patient ID: [name], male   DOB: 01-16-1950, 66 y.o.   MRN: [id number]  HPI: [name] is a 66 y.o.-year-old male, returning for f/u for DM2, dx 2008, insulin-dependent since dx, uncontrolled, with complications (peripheral neuropathy, CAD-status post CABG in 2008, PVD, Charcot foot, history of diabetic foot ulcer, history of retinal detachment in 2002-3). Last visit 3 mo ago. He has [employer], [employer] and supplemental insurance - [employer]. Also Part D - [employer].   He is off his diet >> stopped it after his L BKA>  Last hemoglobin A1c was: Lab Results  Component Value Date   HGBA1C 6.1 02/14/2016   HGBA1C 7.7 12/04/2015   HGBA1C 8.0 08/31/2015   Pt was on a regimen of:  x 1 mo  - Toujeo 60 units 2x a day >> 50 units daily - Humalog at mealtime:  90-130: 25 units >130: 35 units  At last visit, we decreased Toujeo, added back Metformin and added a Humalog SSI >> since then, as sugars were higher >> He increased back toujeo, also increased his Humalog and did not use the sliding scale: - Metformin 1000 mg with dinner - Toujeo 50 units at bedtime - Humalog - skips lunch insulin, but eats a light lunch  30 units before a smaller meal  35 units before a larger meal   Pt checks his sugars 1-3x a day and they are worse - reviewed sugar log: - am: 148-196, 208 >> 131, 142-240 (Thanksgiving) >> 62-135, 176 >> 122-196, 304 - before lunch:   138-201 >> n/c >> 146-240 >> 69, 104-154, 207 >> n/c - before dinner: 130--177, 197 >> 148-256 >> n/c >> 59, 77-135, 165 >> 126, 141-184, 211 - bedtime:  n/c >> 244 >> n/c >> 194-310 >> 117, 175-236 >> n/c No lows. Lowest: 131 >> 59 x1 >> 122 Highest sugar:240 >> 207 >> 304  Pt's meals are: - Breakfast: bacon, eggs, tomato sandwich (sometimes no bread) - Lunch: PB crackers - Dinner: meat + 2 veggies - Snacks: 1 a day - 2x a week, icecream  Uses Splenda in tea and coffee.  - + CKD, last BUN/creatinine:  Lab Results  Component Value Date   BUN  23 (H) 02/23/2016   CREATININE 1.27 (H) 02/23/2016  On Losartan. - last set of lipids: Lab Results  Component Value Date   CHOL 125 02/14/2016   HDL 26.80 (L) 02/14/2016   LDLCALC 71 02/14/2016   LDLDIRECT 72.0 02/13/2015   TRIG 135.0 02/14/2016   CHOLHDL 5 02/14/2016  He is statin-intolerant. - last eye exam was 01/12/2016. No DR. Dr Claudean Kinds.   He had a detached retina in 03/2015 >> had Laser sx (his 3rd). He had cataract sx (R) 06/2015. - He does not have sensation in his feet.  He had amputation of his big toe in 12/30/2012 2/2 infection >> osteomyelitis. He continued to have problems with ulcers on his left foot, in the setting of Charcot joint. He had a L BKA (02/23/2016) b/c he had a long h/o ulcer on his Charcot foot >> developed osteomyelitis.  He also has a history of Prostate cancer, s/p 25 RxTx, then radioactive seeds. Also, hyperlipidemia, hypertension, sleep apnea, osteoarthrosis.   ROS: Constitutional: no weight gain/no weight loss, no fatigue, no subjective hyperthermia, no subjective hypothermia Eyes: no blurry vision, no xerophthalmia ENT: no sore throat, no nodules palpated in throat, no dysphagia, no odynophagia, no hoarseness Cardiovascular: no CP/no SOB/no palpitations/no leg swelling Respiratory: no cough/no SOB/no  wheezing Gastrointestinal: no N/no V/no D/no C/no acid reflux Musculoskeletal: no muscle aches/no joint aches Skin: no rashes, no hair loss Neurological: no tremors/no numbness/no tingling/no dizziness  I reviewed pt's medications, allergies, PMH, social hx, family hx, and changes were documented in the history of present illness. Otherwise, unchanged from my initial visit note.  PE: BP 140/88 (BP Location: Left Arm, Patient Position: Sitting)   Pulse 77   SpO2 97%  There is no height or weight on file to calculate BMI. Could not be weighed as could not stand.  Wt Readings from Last 3 Encounters:  04/02/16 293 lb (132.9 kg)  03/05/16 286 lb  (129.7 kg)  02/23/16 (!) 302 lb (137 kg)   Constitutional: overweight, in NAD, in wheelchair Eyes: PERRLA, EOMI, no exophthalmos ENT: moist mucous membranes, no thyromegaly, no cervical lymphadenopathy Cardiovascular: RRR, No MRG Respiratory: CTA B Gastrointestinal: abdomen soft, NT, ND, BS+ Musculoskeletal: strength intact in all 4; + L BKA and right lower extremity edema  Skin: moist, warm, no rashes; stasis dermatitis in R leg Neurological: no tremor with outstretched hands, DTR normal in all 4   ASSESSMENT: 1. DM2, insulin-dependent, uncontrolled, with complications - peripheral neuropathy - CAD-status post stent in 2005, then 5v CABG in 2008 - PVD - history of diabetic foot ulcer - Charcot foot - L - h/o amputation of his big toe in 12/2012 2/2 osteomyelitis and L BKA 01/2016 - history of retinal detachment in 2002-3  PLAN:  1. Patient uncontrolled DM2 on basal -bolus insulin regimen + added back metformin at last visit. His HbA1c was 6.1% 3 months ago, however, before that time, he was on a strict diet with 500 cal daily, and then relaxed a little, but still paying much attention to his diet. We had to reduce his long-acting insulin because of occasional lows. We also added back metformin. However, after last visit, he came off his diet completely and now his sugars are higher, despite increasing toujeo and Humalog and also, as mentioned above, adding metformin. He is planning to restart a less strict diet, which I strongly encouraged him to do. In this case, I only advised him to cover his lunch with Humalog, but did not change the other medication doses. We did discuss about the need to reduce the doses of insulin if he restarts his diet. He will also be fitted for his prosthesis next week and I hope that he can be more active afterwards. - I advised him to:  Patient Instructions  Please continue: - Metformin 1000 mg with dinner - Toujeo 50 units at bedtime (try to decrease back  to 40 units if you start back on the diet) - Humalog:   30 units before a smaller meal (please decrease to 20 units if you start back on the diet)  35 units before a larger meal (please decrease to 25 units if you start back on the diet) Try to take 10-15 units Humalog before lunch (may not need this if you start the diet) - Humalog Sliding scale: - 150-175: + 3 unit  - 176-200: + 5 units  - > 200: + 7 units    Please return in 3 months with your sugar log.  - today, HbA1c is 7.3%  (higher) - continue checking sugars at different times of the day - check 1x a day, rotating checks - advised for yearly eye exams >> he is UTD - Return to clinic in 3 mo with sugar log   Philemon Kingdom,  MD PhD [facility] Endocrinology

## 2016-05-15 ENCOUNTER — Ambulatory Visit: Payer: Medicare Other | Attending: Orthopedic Surgery | Admitting: Physical Therapy

## 2016-05-15 DIAGNOSIS — R2681 Unsteadiness on feet: Secondary | ICD-10-CM | POA: Insufficient documentation

## 2016-05-15 DIAGNOSIS — M25662 Stiffness of left knee, not elsewhere classified: Secondary | ICD-10-CM | POA: Insufficient documentation

## 2016-05-15 DIAGNOSIS — R2689 Other abnormalities of gait and mobility: Secondary | ICD-10-CM

## 2016-05-15 DIAGNOSIS — M6281 Muscle weakness (generalized): Secondary | ICD-10-CM | POA: Insufficient documentation

## 2016-05-15 DIAGNOSIS — Z9181 History of falling: Secondary | ICD-10-CM | POA: Insufficient documentation

## 2016-05-15 DIAGNOSIS — M25661 Stiffness of right knee, not elsewhere classified: Secondary | ICD-10-CM | POA: Insufficient documentation

## 2016-05-15 NOTE — Therapy (Signed)
[facility] [address] [place], [place], [address] Phone: [phone]   Fax:  [phone]  Patient Details  Name: [name] MRN: [id number] Date of Birth: July 24, 1950 Referring Provider:  [name], MD  Encounter Date: 05/15/2016  Patient had 9:30 PT evaluation appointment and 1:00 appointment with prosthetist to receive his first prosthesis. PT rescheduled evaluation until he receives his prosthesis.   Jamey Reas PT, DPT 05/15/2016, 4:19 PM  [facility] [address] [place], [place], [address] Phone: [phone]   Fax:  [phone]

## 2016-05-16 ENCOUNTER — Encounter: Payer: Self-pay | Admitting: Physical Therapy

## 2016-05-16 ENCOUNTER — Ambulatory Visit: Payer: Medicare Other | Admitting: Physical Therapy

## 2016-05-16 DIAGNOSIS — R2689 Other abnormalities of gait and mobility: Secondary | ICD-10-CM

## 2016-05-16 DIAGNOSIS — Z9181 History of falling: Secondary | ICD-10-CM

## 2016-05-16 DIAGNOSIS — M25662 Stiffness of left knee, not elsewhere classified: Secondary | ICD-10-CM | POA: Diagnosis not present

## 2016-05-16 DIAGNOSIS — M25661 Stiffness of right knee, not elsewhere classified: Secondary | ICD-10-CM

## 2016-05-16 DIAGNOSIS — R2681 Unsteadiness on feet: Secondary | ICD-10-CM | POA: Diagnosis not present

## 2016-05-16 DIAGNOSIS — M6281 Muscle weakness (generalized): Secondary | ICD-10-CM

## 2016-05-16 NOTE — Therapy (Signed)
[facility] [address] [place], [place], [address] Phone: [phone]   Fax:  [phone]  Physical Therapy Evaluation  Patient Details  Name: [name] MRN: [id number] Date of Birth: 1950/09/26 Referring Provider: Dondra Prader NP   Encounter Date: 05/16/2016      PT End of Session - 05/16/16 1622    Visit Number 1   Number of Visits 17   Date for PT Re-Evaluation 07/12/16   Authorization Type Medicare & G-codes every 10th visit    PT Start Time 1100   PT Stop Time 1151   PT Time Calculation (min) 51 min   Equipment Utilized During Treatment Gait belt   Activity Tolerance Patient tolerated treatment well   Behavior During Therapy [facility] for tasks assessed/performed      Past Medical History:  Diagnosis Date  . Coronary atherosclerosis of unspecified type of vessel, native or graft   . Diabetes mellitus without complication ([place])   . Hemorrhage of rectum and anus   . History of kidney stones   . Impotence of organic origin   . Internal hemorrhoids without mention of complication   . Malignant neoplasm of prostate ([place])   . Mononeuritis of unspecified site   . Osteoarthrosis, unspecified whether generalized or localized, unspecified site   . Other and unspecified hyperlipidemia   . Personal history of colonic polyps   . Personal history of diabetic foot ulcer    saw wound center, resolved 05/08/2010  . Personal history of gallstones   . Routine general medical examination at a health care facility   . Type II or unspecified type diabetes mellitus with neurological manifestations, not stated as uncontrolled(250.60)   . Unspecified glaucoma(365.9)   . Unspecified sleep apnea    cpcp  . Unspecified venous (peripheral) insufficiency     Past Surgical History:  Procedure Laterality Date  . AMPUTATION Left 12/30/2012   Procedure: AMPUTATION RAY ;  Surgeon: Meredith Pel, MD;  Location: [facility];  Service:  Orthopedics;  Laterality: Left;  LEFT GREAT TOE RAY AMPUTATION  . AMPUTATION Left 02/23/2016   Procedure: Left Below Knee Amputation;  Surgeon: [name], MD;  Location: [facility];  Service: Orthopedics;  Laterality: Left;  . APPENDECTOMY    . BASAL CELL CARCINOMA EXCISION  2/16   left forearm  . CARDIAC CATHETERIZATION  1998   Negative  . CATARACT EXTRACTION Right 2017   then lid surgery  . CORONARY ARTERY BYPASS GRAFT  09/2005   Post op AFIB  . EYE SURGERY    . FOOT BONE EXCISION Left 11/03/2013   DR DUDA   . I&D EXTREMITY Left 11/03/2013   Procedure: Left Foot Partial Bone Excision Cuboid and Medial Cuneiform, Wound Closures;  Surgeon: [name], MD;  Location: [facility];  Service: Orthopedics;  Laterality: Left;  . INSERTION PROSTATE RADIATION SEED  2009   RT and seeds for prostate cancer  . KIDNEY STONE SURGERY  04/1993  . RCA stents  04/2003   EF 55%  . RETINAL DETACHMENT SURGERY  2002-2003  . thrombosed vein  1993   Right leg    There were no vitals filed for this visit.       Subjective Assessment - 05/16/16 1106    Subjective Patient is a 66 year old male presenting to PT in a wheelchair with his prosthesis in his lap s/p L transtibial amputation (02/23/2016) due to a Charcot deformity. Prior to his L transtibial amputation,  the patient had a L ray amputation (12/30/2012) due to diabetic complications. Since his L transtibial amputation, the patient has received HHPT. Prior to the L transtibial amputation, the patient was ambulating with a RW due to diabetic wounds. The patient received his prosthesis yesterday (05/15/2016), and reports he has not worn his prosthesis, yet. The patient would like to return to working in his workshop in the basement of his home, and walking with more ease.    Patient is accompained by: Family member  fiancee    Pertinent History L Transtibial Amputation (02/23/2016), coronary atherosclerosis, DM type 2 (insulin dependent; diagnosed 2008), malignant  neoplasm of prostate, colonic polyps, glaucoma, sleep apnea, venous insufficiency, HTN, L BKA (02/23/2016) due to Charcot deformity, L ray amputation (12/30/2012), OA, peripheral neuropathy, CABG (2008), retinal detachment   Limitations Lifting;Standing;Walking;House hold activities   Patient Stated Goals work in his workshop (in basement of home), walking    Currently in Pain? No/denies            [facility] PT Assessment - 05/16/16 1100      Assessment   Medical Diagnosis L transtibial amputation    Referring Provider Dondra Prader NP    Onset Date/Surgical Date 05/15/16  Prosthesis delivery   Hand Dominance Right   Prior Therapy HHPT      Precautions   Precautions Fall     Balance Screen   Has the patient fallen in the past 6 months Yes   How many times? 1  forgot to lock wheelchair. no injuries.    Has the patient had a decrease in activity level because of a fear of falling?  Yes   Is the patient reluctant to leave their home because of a fear of falling?  No     Home Environment   Living Environment Private residence   Living Arrangements Spouse/significant other   Type of [place] entrance   [place] Two level  basement - workshop    Alternate Level Stairs-Number of Steps 15 steps or a long ramp to get into basement from outside  to get to workshop in basement    Alternate [place] - 2 wheels;Walker - 4 wheels;Wheelchair - manual;Cane - single point;Tub bench;Grab bars - tub/shower  bariatric RW - 2-wheels      Prior Function   Level of Independence Independent with household mobility with device  for 2+years due to wounds   Leisure working in workshop in basement at home      Observation/Other Assessments   Focus on Therapeutic Outcomes (FOTO)  36.11 Functional Status   Activities of Balance Confidence Scale (ABC Scale)  12.5%   Fear Avoidance Belief Questionnaire (FABQ)  33 (10)     Posture/Postural  Control   Posture/Postural Control Postural limitations   Postural Limitations Rounded Shoulders;Forward head;Posterior pelvic tilt;Flexed trunk;Weight shift right     ROM / Strength   AROM / PROM / Strength PROM;Strength     PROM   Overall PROM Comments Bilateral hips appear to have flexion contracture   PROM Assessment Site Knee   Right/Left Knee Right;Left   Right Knee Extension -10   Left Knee Extension -11     Strength   Overall Strength Within functional limits for tasks performed   Overall Strength Comments Gross, normal strength in BLEs for hip flexion, knee extension/flexion and L ankle DF      Transfers   Transfers Sit to Stand;Stand to  Sit   Sit to Stand 4: Min assist;With upper extremity assist;With armrests;From chair/3-in-1  to RW, requires UE support on armrests and stabilizes on RW   Stand to Sit 4: Min guard;With upper extremity assist;With armrests;To chair/3-in-1  from RW,requires UE support on armrests and stabilizes on RW     Ambulation/Gait   Ambulation/Gait Yes   Ambulation/Gait Assistance 4: Min assist  with RW    Ambulation/Gait Assistance Details excessive weight bearing on UEs on RW   Ambulation Distance (Feet) 55 Feet   Assistive device Rolling walker;Prosthesis   Gait Pattern Decreased step length - right;Decreased stance time - left;Decreased stride length;Decreased weight shift to left;Trunk flexed;Abducted - left;Trunk rotated posteriorly on right;Step-to pattern;Decreased hip/knee flexion - left;Left hip hike;Left circumduction;Left flexed knee in stance;Antalgic;Lateral hip instability   Ambulation Surface Level;Indoor   Gait velocity 0.52ft/s     Standardized Balance Assessment   Standardized Balance Assessment Berg Balance Test     Berg Balance Test   Sit to Stand Needs minimal aid to stand or to stabilize   Standing Unsupported Needs several tries to stand 30 seconds unsupported   Sitting with Back Unsupported but Feet Supported on Floor  or Stool Able to sit safely and securely 2 minutes   Stand to Sit Uses backs of legs against chair to control descent   Transfers Able to transfer safely, definite need of hands   Standing Unsupported with Eyes Closed Able to stand 3 seconds   Standing Ubsupported with Feet Together Needs help to attain position and unable to hold for 15 seconds   From Standing, Reach Forward with Outstretched Arm Reaches forward but needs supervision   From Standing Position, Pick up Object from Floor Unable to try/needs assist to keep balance   From Standing Position, Turn to Look Behind Over each Shoulder Needs assist to keep from losing balance and falling   Turn 360 Degrees Needs assistance while turning   Standing Unsupported, Alternately Place Feet on Step/Stool Needs assistance to keep from falling or unable to try   Standing Unsupported, One Foot in [employer] balance while stepping or standing   Standing on One Leg Unable to try or needs assist to prevent fall   Total Score 14         Prosthetics Assessment - 05/16/16 1100      Prosthetics   Prosthetic Care Dependent with Skin check;Residual limb care;Care of non-amputated limb;Prosthetic cleaning;Ply sock cleaning;Correct ply sock adjustment;Proper wear schedule/adjustment;Proper weight-bearing schedule/adjustment   Donning prosthesis  Mod assist   Doffing prosthesis  Mod assist   Current prosthetic wear tolerance (days/week)  not worn 0 of 1 day since delivery   Current prosthetic wear tolerance (#hours/day)  no wear since delivery    Current prosthetic weight-bearing tolerance (hours/day)  tolerated 5 minutes standing with partial weight on prosthesis without c/o pain or discomfort in limb or back.    Residual limb condition  3 wounds noted: 1cm x 1/2 cm; 1/2cm x 7mm; 83mm round. All 3 of these wounds are along the incision. Most medial wound on incision appears that it may be due to a suture. Scabbing wound noted on tibial tuberosity with  redness. Normal temperature. Minimal hair growth & shiny skin. Dry skin noted especially distal limb, and PT educated on maintaining proper moisture. Redness at distal limb.     K code/activity level with prosthetic use  K2, limited community mobility  [facility] Adult PT Treatment/Exercise - 05/16/16 1100      Prosthetics   Prosthetic Care Comments  Patient received prosthesis yesterday (05/16/2016), and has not worn the prosthesis, yet.    Edema non-pitting edema    Education Provided Skin check;Residual limb care;Prosthetic cleaning;Ply sock cleaning;Correct ply sock adjustment;Proper Donning;Proper Doffing;Proper wear schedule/adjustment;Proper weight-bearing schedule/adjustment   Person(s) Educated Patient;Spouse   Education Method Explanation;Tactile cues;Demonstration;Verbal cues   Education Method Verbalized understanding;Tactile cues required;Verbal cues required;Needs further instruction   Donning Prosthesis Moderate assist   Doffing Prosthesis Moderate assist                PT Education - 05/16/16 1144    Education provided Yes   Education Details plan of care; instructed patient to ONLY stand when he has support in front of him; no walking unless with fiancee with gait belt    Person(s) Educated Patient;Caregiver(s)  fiancee    Methods Explanation   Comprehension Verbalized understanding          PT Short Term Goals - 05/16/16 1631      PT SHORT TERM GOAL #1   Title Patient will verbalize understanding of proper cleaning & demonstrates proper donning & doffing of prosthesis. (TARGET DATE: 06/14/2016)    Time 4   Period Weeks   Status New     PT SHORT TERM GOAL #2   Title Patient report he is wearing the prosthesis > or = 10 hrs total /day without increased skin breakdown. (TARGET DATE: 06/14/2016)    Time 4   Period Weeks   Status New     PT SHORT TERM GOAL #3   Title Patient will demonstrate ability to ambulate 150' on level, indoor  surfaces with RW and prosthesis with supervision. (TARGET DATE: 06/14/2016)    Time 4   Period Weeks   Status New     PT SHORT TERM GOAL #4   Title Patient demonstrates standing balance with rolling walker & prosthesis reaching 10" anteriorly, picks up object off floor and manages pants for toileting. (TARGET DATE: 06/14/2016)    Time 4   Period Weeks   Status New     PT SHORT TERM GOAL #5   Title Patient will verbalize understanding and return demonstration for initial HEP. (TARGET DATE: 06/14/2016)    Time 4   Period Weeks   Status New     Additional Short Term Goals   Additional Short Term Goals Yes     PT SHORT TERM GOAL #6   Title Patient negotiates stairs (2 rails), ramp & curb with RW & prosthesis with minimal assist to indicate a decrease in his risk of falling. (TARGET DATE: 06/14/2016)    Time 4   Period Weeks   Status New           PT Long Term Goals - 05/16/16 1635      PT LONG TERM GOAL #1   Title Patient demonstrates & verbalizes proper prosthetic care to enable safe use of prostheis without skin issues or pain.  (Target Date: 07/12/2016)   Time 8   Period Weeks   Status New     PT LONG TERM GOAL #2   Title Patient will score >/= 30/56 on the Berg Balance Test to indicate a decrease in his risk of falling. (TARGET DATE: 07/12/2016)    Time 8   Period Weeks   Status New     PT LONG TERM GOAL #3   Title PT will perform TUG  and LTG will be set. (TARGET DATE: 07/12/2016)    Time 8   Period Weeks   Status New     PT LONG TERM GOAL #4   Title Patient ambulates 250' with rolling walker & prosthesis modified independent to enable community mobility.  (Target Date: 07/12/2016)   Time 8   Period Weeks   Status New     PT LONG TERM GOAL #5   Title Patient negotiates ramps, curbs with RW & stairs with 2 rails or 1 rail / AD with prosthesis modified independent to enable community access.  (Target Date: 07/12/2016)   Time 8   Period Weeks   Status New     Additional Long  Term Goals   Additional Long Term Goals Yes     PT LONG TERM GOAL #6   Title Patient tolerates wear of prosthesis >90% of awake hours without skin issues or limb pain to enable function throughout day. (TARGET DATE: 07/12/2016)    Time 8   Period Weeks   Status New               Plan - 05/16/16 1629    Clinical Impression Statement Patient is a 66 year old male presenting to OPPT neuro for a moderate complexity PT evaluation due to a L transtibial amputation on 02/23/2016 due to a Charcot foot deformity. The following deficits were noted during the patient's exam: dependence in prosthetic care which increases risk of skin issues with prosthesis wear, Pt has limited wear of prosthesis which limits function during his awake hours. He recieved his first prosthesis 1 day prior to evaluation but he has not worn it since delivery due to dependency. He has decreased weight shift onto LLE with improper posture negatively impacting balance & standing endurance. He has decreased knowledge of safety and use of DME including prosthesis. The patient's Berg Balance score of 14/56 indicates high fall risk and dependency in ADLs  His gait velocity of 0.14ft/s indicate he is at a high risk for falls and limited household ambulator. His gait is dependent requiring assist and excessive UE support on RW with gait deviations indicating fall risk.  PatientPatient would benefit from skilled PT to address these impairments and functional limitations to maximize functional mobility independence and reduce falls risk. Patient's plan of care is moderate complexity.     Rehab Potential Good   Clinical Impairments Affecting Rehab Potential coronary atherosclerosis, DM type 2 (insulin dependent; diagnosed 2008), malignant neoplasm of prostate, colonic polyps, glaucoma, sleep apnea, venous insufficiency, HTN, L BKA (02/23/2016) due to Charcot deformity, L ray amputation (12/30/2012), OA, peripheral neuropathy, CABG (2008),  retinal detachment   PT Frequency 2x / week   PT Duration 8 weeks   PT Treatment/Interventions ADLs/Self Care Home Management;Neuromuscular re-education;Balance training;Therapeutic exercise;Therapeutic activities;Functional mobility training;Stair training;Gait training;DME Instruction;Patient/family education;Prosthetic Training;Passive range of motion   PT Next Visit Plan TUG & set LTG, review prosthetic care, instruct in HEP for midline & balance at sink, prosthetic gait with RW   Consulted and Agree with Plan of Care Patient;Family member/caregiver  fiancee       Patient will benefit from skilled therapeutic intervention in order to improve the following deficits and impairments:  Abnormal gait, Decreased activity tolerance, Decreased balance, Decreased coordination, Decreased safety awareness, Decreased range of motion, Decreased mobility, Decreased knowledge of use of DME, Decreased endurance, Decreased strength, Difficulty walking, Impaired flexibility, Improper body mechanics, Postural dysfunction, Prosthetic Dependency  Visit Diagnosis: Other abnormalities of gait and mobility  Unsteadiness on feet  History of falling  Muscle weakness (generalized)  Stiffness of left knee, not elsewhere classified  Stiffness of right knee, not elsewhere classified      G-Codes - 06/14/16 May 05, 2242    Functional Assessment Tool Used (Outpatient Only) Patient has not worn prosthesis since delivery 1 day prior to evaluation. Patient has wounds & fragile skin condition. Pateint is dependent in all aspects of prosthetic care.    Functional Limitation Self care   Self Care Current Status [phone]) 100 percent impaired, limited or restricted   Self Care Goal Status ([id number]) At least 1 percent but less than 20 percent impaired, limited or restricted       Problem List Patient Active Problem List   Diagnosis Date Noted  . Status post below knee amputation of left lower extremity ([place]) 02/23/2016  .  Type 2 diabetes mellitus with diabetic polyneuropathy, with long-term current use of insulin ([place]) 03/29/2015  . Advance directive discussed with patient 02/07/2014  . Normocytic anemia 04/01/2013  . Diabetic Charcot foot ([place]) 04/01/2013  . BMI 40.0-44.9, adult ([place]) 12/30/2012  . Routine general medical examination at a health care facility 12/25/2010  . HEMORRHOIDS-INTERNAL 11/22/2009  . PERSONAL HX COLONIC POLYPS 11/22/2009  . VENOUS INSUFFICIENCY 06/29/2008  . Osteoarthritis, multiple sites 04/03/2007  . ERECTILE DYSFUNCTION, ORGANIC 06/13/2006  . Hyperlipemia 06/11/2006  . GLAUCOMA 06/11/2006  . Coronary atherosclerosis of native coronary artery 06/11/2006  . Obstructive sleep apnea 06/11/2006   Arelia Sneddon, SPT 2016/06/14, 4:48 PM  Jamey Reas, PT, DPT  June 14, 2016, 10:46 PM  [facility] [address] [place], [place], [address] Phone: [phone]   Fax:  [phone]  Name: [name] MRN: [id number] Date of Birth: 1950/02/04

## 2016-05-20 ENCOUNTER — Encounter: Payer: Self-pay | Admitting: Physical Therapy

## 2016-05-20 ENCOUNTER — Ambulatory Visit: Payer: Medicare Other | Admitting: Physical Therapy

## 2016-05-20 DIAGNOSIS — M25661 Stiffness of right knee, not elsewhere classified: Secondary | ICD-10-CM

## 2016-05-20 DIAGNOSIS — M25662 Stiffness of left knee, not elsewhere classified: Secondary | ICD-10-CM | POA: Diagnosis not present

## 2016-05-20 DIAGNOSIS — Z9181 History of falling: Secondary | ICD-10-CM | POA: Diagnosis not present

## 2016-05-20 DIAGNOSIS — R2689 Other abnormalities of gait and mobility: Secondary | ICD-10-CM

## 2016-05-20 DIAGNOSIS — M6281 Muscle weakness (generalized): Secondary | ICD-10-CM

## 2016-05-20 DIAGNOSIS — R2681 Unsteadiness on feet: Secondary | ICD-10-CM | POA: Diagnosis not present

## 2016-05-20 NOTE — Therapy (Signed)
[facility] [address] [place], [place], [address] Phone: [phone]   Fax:  [phone]  Physical Therapy Treatment  Patient Details  Name: [name] MRN: [id number] Date of Birth: 12-04-1950 Referring Provider: Dondra Prader NP   Encounter Date: 05/20/2016      PT End of Session - 05/20/16 1304    Visit Number 2   Number of Visits 17   Date for PT Re-Evaluation 07/12/16   Authorization Type Medicare & G-codes every 10th visit    PT Start Time 1150   PT Stop Time 1235   PT Time Calculation (min) 45 min   Equipment Utilized During Treatment Gait belt   Activity Tolerance Patient tolerated treatment well   Behavior During Therapy [facility] for tasks assessed/performed      Past Medical History:  Diagnosis Date  . Coronary atherosclerosis of unspecified type of vessel, native or graft   . Diabetes mellitus without complication ([place])   . Hemorrhage of rectum and anus   . History of kidney stones   . Impotence of organic origin   . Internal hemorrhoids without mention of complication   . Malignant neoplasm of prostate ([place])   . Mononeuritis of unspecified site   . Osteoarthrosis, unspecified whether generalized or localized, unspecified site   . Other and unspecified hyperlipidemia   . Personal history of colonic polyps   . Personal history of diabetic foot ulcer    saw wound center, resolved 05/08/2010  . Personal history of gallstones   . Routine general medical examination at a health care facility   . Type II or unspecified type diabetes mellitus with neurological manifestations, not stated as uncontrolled(250.60)   . Unspecified glaucoma(365.9)   . Unspecified sleep apnea    cpcp  . Unspecified venous (peripheral) insufficiency     Past Surgical History:  Procedure Laterality Date  . AMPUTATION Left 12/30/2012   Procedure: AMPUTATION RAY ;  Surgeon: Meredith Pel, MD;  Location: [facility];  Service:  Orthopedics;  Laterality: Left;  LEFT GREAT TOE RAY AMPUTATION  . AMPUTATION Left 02/23/2016   Procedure: Left Below Knee Amputation;  Surgeon: [name], MD;  Location: [facility];  Service: Orthopedics;  Laterality: Left;  . APPENDECTOMY    . BASAL CELL CARCINOMA EXCISION  2/16   left forearm  . CARDIAC CATHETERIZATION  1998   Negative  . CATARACT EXTRACTION Right 2017   then lid surgery  . CORONARY ARTERY BYPASS GRAFT  09/2005   Post op AFIB  . EYE SURGERY    . FOOT BONE EXCISION Left 11/03/2013   DR DUDA   . I&D EXTREMITY Left 11/03/2013   Procedure: Left Foot Partial Bone Excision Cuboid and Medial Cuneiform, Wound Closures;  Surgeon: [name], MD;  Location: [facility];  Service: Orthopedics;  Laterality: Left;  . INSERTION PROSTATE RADIATION SEED  2009   RT and seeds for prostate cancer  . KIDNEY STONE SURGERY  04/1993  . RCA stents  04/2003   EF 55%  . RETINAL DETACHMENT SURGERY  2002-2003  . thrombosed vein  1993   Right leg    There were no vitals filed for this visit.      Subjective Assessment - 05/20/16 1155    Subjective Pt returns after evaluation last week.  Reports he is wearing prosthesis 2hr, 2x/day and stands only when donning prosthesis.  Reports when he doffs prosthesis he can pour "water" out of it due to sweating.  Fiance inspects skin after doffing, no issues to report.   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 No/denies                          [facility] Adult PT Treatment/Exercise - 05/20/16 1252      Transfers   Transfers Sit to Stand;Stand to Sit   Sit to Stand 4: Min guard   Stand to Sit 4: Min guard     Ambulation/Gait   Ambulation/Gait Yes   Ambulation/Gait Assistance 4: Min assist   Ambulation/Gait Assistance Details Verbal cues required for wider BOS, increased step length and stance time on LLE, increased weight shift to LLE vs leaning to L during stance.     Ambulation Distance (Feet) 115 Feet   Assistive device Rolling walker;Prosthesis   Gait Pattern Step-to pattern;Step-through pattern;Decreased step length - right;Decreased step length - left;Decreased stance time - left;Decreased stride length;Decreased weight shift to left;Trendelenburg;Lateral trunk lean to left;Narrow base of support   Ambulation Surface Level;Indoor   Gait Comments Pt reporting feeling as if socket was pressing his knee back into extreme extension.  No recurvatum noted.     Prosthetics   Prosthetic Care Comments  Pt has been experiencing increased perspiration during 2 hours of wear time and can pour "liquid" out of liner when doffing.  Patting limb dry and wiping liner dry.     Current prosthetic wear tolerance (days/week)  2hr, 2x/day   Current prosthetic weight-bearing tolerance (hours/day)  >5 minutes with decreased pain with 8 ply socks   Edema minimal pitting edema distal limb   Residual limb condition  No new wounds noted; skin shiny and smooth at distal limb   Education Provided Skin check;Residual limb care;Prosthetic cleaning;Ply sock cleaning;Correct ply sock adjustment;Proper Donning;Proper weight-bearing schedule/adjustment   Person(s) Educated Patient;Other (comment)  fiance   Education Method Explanation;Demonstration;Verbal cues   Education Method Verbalized understanding;Returned demonstration   Donning Prosthesis Minimal assist   Doffing Prosthesis Supervision                 PT Education - 05/20/16 1301    Education provided Yes   Education Details see prosthetics education section + continued focus on stretching L hamstring mm in sitting with prosthesis doffed.  Educated on use of sweat band on upper thigh under liner, anti-perspirant on LLE to manage perspiration and possible need for prescription DrySol.  Discussed use of mirror for skin checks and check placement of socks   Person(s) Educated Patient;Caregiver(s)  fiance   Methods Explanation;Demonstration   Comprehension Need further instruction          PT Short Term Goals - 05/16/16 1631      PT SHORT TERM GOAL #1   Title Patient will verbalize understanding of proper cleaning & demonstrates proper donning & doffing of prosthesis. (TARGET DATE: 06/14/2016)    Time 4   Period Weeks   Status New     PT SHORT TERM GOAL #2   Title Patient report he is wearing the prosthesis > or =  10 hrs total /day without increased skin breakdown. (TARGET DATE: 06/14/2016)    Time 4   Period Weeks   Status New     PT SHORT TERM GOAL #3   Title Patient will demonstrate ability to ambulate 150' on level, indoor surfaces with RW and prosthesis with supervision. (TARGET DATE: 06/14/2016)    Time 4   Period Weeks   Status New     PT SHORT TERM GOAL #4   Title Patient demonstrates standing balance with rolling walker & prosthesis reaching 10" anteriorly, picks up object off floor and manages pants for toileting. (TARGET DATE: 06/14/2016)    Time 4   Period Weeks   Status New     PT SHORT TERM GOAL #5   Title Patient will verbalize understanding and return demonstration for initial HEP. (TARGET DATE: 06/14/2016)    Time 4   Period Weeks   Status New     Additional Short Term Goals   Additional Short Term Goals Yes     PT SHORT TERM GOAL #6   Title Patient negotiates stairs (2 rails), ramp & curb with RW & prosthesis with minimal assist to indicate a decrease in his risk of falling. (TARGET DATE: 06/14/2016)    Time 4    Period Weeks   Status New           PT Long Term Goals - 05/16/16 1635      PT LONG TERM GOAL #1   Title Patient demonstrates & verbalizes proper prosthetic care to enable safe use of prostheis without skin issues or pain.  (Target Date: 07/12/2016)   Time 8   Period Weeks   Status New     PT LONG TERM GOAL #2   Title Patient will score >/= 30/56 on the Berg Balance Test to indicate a decrease in his risk of falling. (TARGET DATE: 07/12/2016)    Time 8   Period Weeks   Status New     PT LONG TERM GOAL #3   Title PT will perform TUG and LTG will be set. (TARGET DATE: 07/12/2016)    Time 8   Period Weeks   Status New     PT LONG TERM GOAL #4   Title Patient ambulates 250' with rolling walker & prosthesis modified independent to enable community mobility.  (Target Date: 07/12/2016)   Time 8   Period Weeks   Status New     PT LONG TERM GOAL #5   Title Patient negotiates ramps, curbs with RW & stairs with 2 rails or 1 rail / AD with prosthesis modified independent to enable community access.  (Target Date: 07/12/2016)   Time 8   Period Weeks   Status New     Additional Long Term Goals   Additional Long Term Goals Yes     PT LONG TERM GOAL #6   Title Patient tolerates wear of prosthesis >90% of awake hours without skin issues or limb pain to enable function throughout day. (TARGET DATE: 07/12/2016)    Time 8   Period Weeks   Status New               Plan - 05/20/16 1305    Clinical Impression Statement Treatment session today with increased focus on continuing education regarding management of perspiration, skin care, care of prosthesis, proper fit and increase from 5 ply to 8 ply socks for improved fit and positioning of limb in socket (pt sitting too deep in socket with 5  ply).  Also performed gait assessment and gait sequence training with prosthesis with 8 ply socks with pt tolerating further distances but will continue to require education and training before he will  be safe enough to perform gait at home with fiance assistance.  Will continue to progress and will asses TUG at next visit.   Rehab Potential Good   Clinical Impairments Affecting Rehab Potential coronary atherosclerosis, DM type 2 (insulin dependent; diagnosed 2008), malignant neoplasm of prostate, colonic polyps, glaucoma, sleep apnea, venous insufficiency, HTN, L BKA (02/23/2016) due to Charcot deformity, L ray amputation (12/30/2012), OA, peripheral neuropathy, CABG (2008), retinal detachment   PT Frequency 2x / week   PT Duration 8 weeks   PT Treatment/Interventions ADLs/Self Care Home Management;Neuromuscular re-education;Balance training;Therapeutic exercise;Therapeutic activities;Functional mobility training;Stair training;Gait training;DME Instruction;Patient/family education;Prosthetic Training;Passive range of motion   PT Next Visit Plan TUG & set LTG, review recommendations for perspiration management and wear time, instruct in HEP for midline & balance at sink, prosthetic gait with RW   Consulted and Agree with Plan of Care Patient;Family member/caregiver   Family Member Consulted Fiance      Patient will benefit from skilled therapeutic intervention in order to improve the following deficits and impairments:  Abnormal gait, Decreased activity tolerance, Decreased balance, Decreased coordination, Decreased safety awareness, Decreased range of motion, Decreased mobility, Decreased knowledge of use of DME, Decreased endurance, Decreased strength, Difficulty walking, Impaired flexibility, Improper body mechanics, Postural dysfunction, Prosthetic Dependency  Visit Diagnosis: Other abnormalities of gait and mobility  Unsteadiness on feet  History of falling  Muscle weakness (generalized)  Stiffness of left knee, not elsewhere classified  Stiffness of right knee, not elsewhere classified     Problem List Patient Active Problem List   Diagnosis Date Noted  . Status post below  knee amputation of left lower extremity ([place]) 02/23/2016  . Type 2 diabetes mellitus with diabetic polyneuropathy, with long-term current use of insulin ([place]) 03/29/2015  . Advance directive discussed with patient 02/07/2014  . Normocytic anemia 04/01/2013  . Diabetic Charcot foot ([place]) 04/01/2013  . BMI 40.0-44.9, adult ([place]) 12/30/2012  . Routine general medical examination at a health care facility 12/25/2010  . HEMORRHOIDS-INTERNAL 11/22/2009  . PERSONAL HX COLONIC POLYPS 11/22/2009  . VENOUS INSUFFICIENCY 06/29/2008  . Osteoarthritis, multiple sites 04/03/2007  . ERECTILE DYSFUNCTION, ORGANIC 06/13/2006  . Hyperlipemia 06/11/2006  . GLAUCOMA 06/11/2006  . Coronary atherosclerosis of native coronary artery 06/11/2006  . Obstructive sleep apnea 06/11/2006    Raylene Everts, PT, DPT 05/20/16    1:11 PM    [facility] [address] [place], [place], [address] Phone: [phone]   Fax:  [phone]  Name: CASEN PRYOR MRN: [id number] Date of Birth: 10/15/1950

## 2016-05-23 ENCOUNTER — Encounter: Payer: Self-pay | Admitting: Physical Therapy

## 2016-05-23 ENCOUNTER — Ambulatory Visit: Payer: Medicare Other | Admitting: Physical Therapy

## 2016-05-23 DIAGNOSIS — Z9181 History of falling: Secondary | ICD-10-CM

## 2016-05-23 DIAGNOSIS — R2689 Other abnormalities of gait and mobility: Secondary | ICD-10-CM | POA: Diagnosis not present

## 2016-05-23 DIAGNOSIS — M6281 Muscle weakness (generalized): Secondary | ICD-10-CM

## 2016-05-23 DIAGNOSIS — M25662 Stiffness of left knee, not elsewhere classified: Secondary | ICD-10-CM | POA: Diagnosis not present

## 2016-05-23 DIAGNOSIS — R2681 Unsteadiness on feet: Secondary | ICD-10-CM | POA: Diagnosis not present

## 2016-05-23 DIAGNOSIS — M25661 Stiffness of right knee, not elsewhere classified: Secondary | ICD-10-CM | POA: Diagnosis not present

## 2016-05-23 NOTE — Patient Instructions (Signed)

## 2016-05-24 NOTE — Therapy (Signed)
[facility] [address] [place], [place], [address] Phone: [phone]   Fax:  [phone]  Physical Therapy Treatment  Patient Details  Name: [name] MRN: [id number] Date of Birth: Nov 08, 1950 Referring Provider: Dondra Prader NP   Encounter Date: 05/23/2016   05/23/16 1028  PT Visits / Re-Eval  Visit Number 3  Number of Visits 17  Date for PT Re-Evaluation 07/12/16  Authorization  Authorization Type Medicare & G-codes every 10th visit   PT Time Calculation  PT Start Time 1020  PT Stop Time 1105  PT Time Calculation (min) 45 min  PT - End of Session  Equipment Utilized During Treatment Gait belt  Activity Tolerance Patient tolerated treatment well  Behavior During Therapy [facility] for tasks assessed/performed     Past Medical History:  Diagnosis Date  . Coronary atherosclerosis of unspecified type of vessel, native or graft   . Diabetes mellitus without complication ([place])   . Hemorrhage of rectum and anus   . History of kidney stones   . Impotence of organic origin   . Internal hemorrhoids without mention of complication   . Malignant neoplasm of prostate ([place])   . Mononeuritis of unspecified site   . Osteoarthrosis, unspecified whether generalized or localized, unspecified site   . Other and unspecified hyperlipidemia   . Personal history of colonic polyps   . Personal history of diabetic foot ulcer    saw wound center, resolved 05/08/2010  . Personal history of gallstones   . Routine general medical examination at a health care facility   . Type II or unspecified type diabetes mellitus with neurological manifestations, not stated as uncontrolled(250.60)   . Unspecified glaucoma(365.9)   . Unspecified sleep apnea    cpcp  . Unspecified venous (peripheral) insufficiency     Past Surgical History:  Procedure Laterality Date  . AMPUTATION Left 12/30/2012   Procedure: AMPUTATION RAY ;  Surgeon: Meredith Pel, MD;  Location: [facility];  Service: Orthopedics;  Laterality: Left;  LEFT GREAT TOE RAY AMPUTATION  . AMPUTATION Left 02/23/2016   Procedure: Left Below Knee Amputation;  Surgeon: [name], MD;  Location: [facility];  Service: Orthopedics;  Laterality: Left;  . APPENDECTOMY    . BASAL CELL CARCINOMA EXCISION  2/16   left forearm  . CARDIAC CATHETERIZATION  1998   Negative  . CATARACT EXTRACTION Right 2017   then lid surgery  . CORONARY ARTERY BYPASS GRAFT  09/2005   Post op AFIB  . EYE SURGERY    . FOOT BONE EXCISION Left 11/03/2013   DR DUDA   . I&D EXTREMITY Left 11/03/2013   Procedure: Left Foot Partial Bone Excision Cuboid and Medial Cuneiform, Wound Closures;  Surgeon: [name], MD;  Location: [facility];  Service: Orthopedics;  Laterality: Left;  . INSERTION PROSTATE RADIATION SEED  2009   RT and seeds for prostate cancer  . KIDNEY STONE SURGERY  04/1993  . RCA stents  04/2003   EF 55%  . RETINAL DETACHMENT SURGERY  2002-2003  . thrombosed vein  1993   Right leg    There were no vitals filed for this visit.   05/23/16 1025  Symptoms/Limitations  Subjective No new complaints. Walking some at home with fiance. Reports the sweating is better with use of barrier sock.   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 No/denies      05/23/16 1029  Transfers  Transfers Sit to Stand;Stand to Sit  Sit to Stand 4: Min guard;With upper extremity assist;From chair/3-in-1;With armrests  Sit to Stand  Details Verbal cues for sequencing;Verbal cues for technique;Verbal cues for safe use of DME/AE  Stand to Sit 4: Min guard;With upper extremity assist;To chair/3-in-1;With armrests  Stand to Sit Details (indicate cue type and reason) Verbal cues for sequencing;Verbal cues for technique;Verbal cues for safe use of DME/AE  Ambulation/Gait  Ambulation/Gait Yes  Ambulation/Gait Assistance 4: Min guard  Ambulation/Gait Assistance Details cues for posture, step length, weight shifitng and to increase base of support  Ambulation Distance (Feet) 85 Feet (x2)  Assistive device Rolling walker;Prosthesis  Gait Pattern Step-through pattern;Decreased stride length;Decreased step length - right;Decreased stance time - left;Decreased weight shift to left;Right flexed knee in stance;Trunk flexed;Narrow base of support  Ambulation Surface Level;Indoor  Prosthetics  Prosthetic Care Comments  Instructed pt to increase wear time to 3 hours   Current prosthetic wear tolerance (days/week)  daily  Current prosthetic wear tolerance (#hours/day)  2 hrs 2x day  Residual limb condition  wounds from eval appear to be healing well, no new issues.   Education Provided Proper wear schedule/adjustment;Proper weight-bearing schedule/adjustment;Residual limb care  Person(s) Educated Patient;Other (comment) (fiance)  Education Method Explanation;Demonstration;Verbal cues  Education Method Verbalized understanding;Returned demonstration;Verbal cues required  Donning Prosthesis 5  Doffing Prosthesis 5          PT Education - 05/23/16 1107    Education provided Yes   Education Details sink HEP   Person(s) Educated Patient;Other (comment)  fiance   Methods Explanation;Demonstration;Verbal cues;Handout   Comprehension Verbalized understanding;Returned demonstration;Verbal cues required;Need further instruction          PT Short Term Goals - 05/16/16 1631      PT SHORT TERM GOAL #1   Title Patient will verbalize  understanding of proper cleaning & demonstrates proper donning & doffing of prosthesis. (TARGET DATE: 06/14/2016)    Time 4   Period Weeks   Status New     PT SHORT TERM GOAL #2   Title Patient report he is wearing the prosthesis > or = 10 hrs total /day without increased skin breakdown. (TARGET DATE: 06/14/2016)    Time 4   Period Weeks   Status New     PT SHORT TERM GOAL #3   Title Patient will demonstrate ability to ambulate 150' on level, indoor surfaces with RW and prosthesis with supervision. (TARGET DATE: 06/14/2016)    Time 4   Period Weeks   Status New     PT SHORT TERM GOAL #4   Title Patient demonstrates standing balance with rolling walker & prosthesis reaching 10" anteriorly, picks up object off floor and manages pants for toileting. (TARGET DATE: 06/14/2016)    Time 4   Period Weeks   Status New     PT SHORT TERM GOAL #5   Title Patient will verbalize understanding and return demonstration for initial HEP. (TARGET DATE: 06/14/2016)    Time 4   Period Weeks   Status New     Additional Short Term Goals   Additional Short Term Goals Yes     PT  SHORT TERM GOAL #6   Title Patient negotiates stairs (2 rails), ramp & curb with RW & prosthesis with minimal assist to indicate a decrease in his risk of falling. (TARGET DATE: 06/14/2016)    Time 4   Period Weeks   Status New           PT Long Term Goals - 05/16/16 1635      PT LONG TERM GOAL #1   Title Patient demonstrates & verbalizes proper prosthetic care to enable safe use of prostheis without skin issues or pain.  (Target Date: 07/12/2016)   Time 8   Period Weeks   Status New     PT LONG TERM GOAL #2   Title Patient will score >/= 30/56 on the Berg Balance Test to indicate a decrease in his risk of falling. (TARGET DATE: 07/12/2016)    Time 8   Period Weeks   Status New     PT LONG TERM GOAL #3   Title PT will perform TUG and LTG will be set. (TARGET DATE: 07/12/2016)    Time 8   Period Weeks   Status New     PT  LONG TERM GOAL #4   Title Patient ambulates 250' with rolling walker & prosthesis modified independent to enable community mobility.  (Target Date: 07/12/2016)   Time 8   Period Weeks   Status New     PT LONG TERM GOAL #5   Title Patient negotiates ramps, curbs with RW & stairs with 2 rails or 1 rail / AD with prosthesis modified independent to enable community access.  (Target Date: 07/12/2016)   Time 8   Period Weeks   Status New     Additional Long Term Goals   Additional Long Term Goals Yes     PT LONG TERM GOAL #6   Title Patient tolerates wear of prosthesis >90% of awake hours without skin issues or limb pain to enable function throughout day. (TARGET DATE: 07/12/2016)    Time 8   Period Weeks   Status New        05/23/16 1028  Plan  Clinical Impression Statement Today's skilled session continued to address prosthetic management, gait and initiated sink balance/proprioception HEP. Pt is making steady progress, more stable with gait today with RW. Pt cleared for short walks in home with RW/prosthesis when fiance is nearby for safety. Pt should benefit from continued PT to progress toward umet goals.  Pt will benefit from skilled therapeutic intervention in order to improve on the following deficits Abnormal gait;Decreased activity tolerance;Decreased balance;Decreased coordination;Decreased safety awareness;Decreased range of motion;Decreased mobility;Decreased knowledge of use of DME;Decreased endurance;Decreased strength;Difficulty walking;Impaired flexibility;Improper body mechanics;Postural dysfunction;Prosthetic Dependency  Rehab Potential Good  Clinical Impairments Affecting Rehab Potential coronary atherosclerosis, DM type 2 (insulin dependent; diagnosed 2008), malignant neoplasm of prostate, colonic polyps, glaucoma, sleep apnea, venous insufficiency, HTN, L BKA (02/23/2016) due to Charcot deformity, L ray amputation (12/30/2012), OA, peripheral neuropathy, CABG (2008), retinal  detachment  PT Frequency 2x / week  PT Duration 8 weeks  PT Treatment/Interventions ADLs/Self Care Home Management;Neuromuscular re-education;Balance training;Therapeutic exercise;Therapeutic activities;Functional mobility training;Stair training;Gait training;DME Instruction;Patient/family education;Prosthetic Training;Passive range of motion  PT Next Visit Plan TUG & set LTG, review recommendations for perspiration management and wear time, prosthetic gait with RW work on distance and correction of gait deviations  Consulted and Agree with Plan of Care Patient;Family member/caregiver  Family Member Consulted Fiance       Patient will benefit from skilled therapeutic intervention  in order to improve the following deficits and impairments:  Abnormal gait, Decreased activity tolerance, Decreased balance, Decreased coordination, Decreased safety awareness, Decreased range of motion, Decreased mobility, Decreased knowledge of use of DME, Decreased endurance, Decreased strength, Difficulty walking, Impaired flexibility, Improper body mechanics, Postural dysfunction, Prosthetic Dependency  Visit Diagnosis: Other abnormalities of gait and mobility  Unsteadiness on feet  History of falling  Muscle weakness (generalized)     Problem List Patient Active Problem List   Diagnosis Date Noted  . Status post below knee amputation of left lower extremity ([place]) 02/23/2016  . Type 2 diabetes mellitus with diabetic polyneuropathy, with long-term current use of insulin ([place]) 03/29/2015  . Advance directive discussed with patient 02/07/2014  . Normocytic anemia 04/01/2013  . Diabetic Charcot foot ([place]) 04/01/2013  . BMI 40.0-44.9, adult ([place]) 12/30/2012  . Routine general medical examination at a health care facility 12/25/2010  . HEMORRHOIDS-INTERNAL 11/22/2009  . PERSONAL HX COLONIC POLYPS 11/22/2009  . VENOUS INSUFFICIENCY 06/29/2008  . Osteoarthritis, multiple sites 04/03/2007  . ERECTILE  DYSFUNCTION, ORGANIC 06/13/2006  . Hyperlipemia 06/11/2006  . GLAUCOMA 06/11/2006  . Coronary atherosclerosis of native coronary artery 06/11/2006  . Obstructive sleep apnea 06/11/2006    Willow Ora, PTA, [facility] [address], [place],  [address] [phone] 05/24/16, 6:51 PM   Name: [name] MRN: [id number] Date of Birth: February 26, 1950

## 2016-05-27 ENCOUNTER — Ambulatory Visit: Payer: Medicare Other | Admitting: Physical Therapy

## 2016-05-27 DIAGNOSIS — Z9181 History of falling: Secondary | ICD-10-CM

## 2016-05-27 DIAGNOSIS — R2681 Unsteadiness on feet: Secondary | ICD-10-CM | POA: Diagnosis not present

## 2016-05-27 DIAGNOSIS — M25662 Stiffness of left knee, not elsewhere classified: Secondary | ICD-10-CM | POA: Diagnosis not present

## 2016-05-27 DIAGNOSIS — R2689 Other abnormalities of gait and mobility: Secondary | ICD-10-CM

## 2016-05-27 DIAGNOSIS — M25661 Stiffness of right knee, not elsewhere classified: Secondary | ICD-10-CM

## 2016-05-27 DIAGNOSIS — M6281 Muscle weakness (generalized): Secondary | ICD-10-CM | POA: Diagnosis not present

## 2016-05-28 ENCOUNTER — Encounter: Payer: Self-pay | Admitting: Physical Therapy

## 2016-05-28 NOTE — Therapy (Signed)
[facility] [address] [place], [place], [address] Phone: [phone]   Fax:  [phone]  Physical Therapy Treatment  Patient Details  Name: [name] MRN: [id number] Date of Birth: May 04, 1950 Referring Provider: Dondra Prader NP   Encounter Date: 05/27/2016      PT End of Session - 05/28/16 0750    Visit Number 4   Number of Visits 17   Date for PT Re-Evaluation 07/12/16   Authorization Type Medicare & G-codes every 10th visit    PT Start Time 1447   PT Stop Time 1536   PT Time Calculation (min) 49 min   Equipment Utilized During Treatment Gait belt   Activity Tolerance Patient tolerated treatment well   Behavior During Therapy [facility] for tasks assessed/performed      Past Medical History:  Diagnosis Date  . Coronary atherosclerosis of unspecified type of vessel, native or graft   . Diabetes mellitus without complication ([place])   . Hemorrhage of rectum and anus   . History of kidney stones   . Impotence of organic origin   . Internal hemorrhoids without mention of complication   . Malignant neoplasm of prostate ([place])   . Mononeuritis of unspecified site   . Osteoarthrosis, unspecified whether generalized or localized, unspecified site   . Other and unspecified hyperlipidemia   . Personal history of colonic polyps   . Personal history of diabetic foot ulcer    saw wound center, resolved 05/08/2010  . Personal history of gallstones   . Routine general medical examination at a health care facility   . Type II or unspecified type diabetes mellitus with neurological manifestations, not stated as uncontrolled(250.60)   . Unspecified glaucoma(365.9)   . Unspecified sleep apnea    cpcp  . Unspecified venous (peripheral) insufficiency     Past Surgical History:  Procedure Laterality Date  . AMPUTATION Left 12/30/2012   Procedure: AMPUTATION RAY ;  Surgeon: Meredith Pel, MD;  Location: [facility];  Service:  Orthopedics;  Laterality: Left;  LEFT GREAT TOE RAY AMPUTATION  . AMPUTATION Left 02/23/2016   Procedure: Left Below Knee Amputation;  Surgeon: [name], MD;  Location: [facility];  Service: Orthopedics;  Laterality: Left;  . APPENDECTOMY    . BASAL CELL CARCINOMA EXCISION  2/16   left forearm  . CARDIAC CATHETERIZATION  1998   Negative  . CATARACT EXTRACTION Right 2017   then lid surgery  . CORONARY ARTERY BYPASS GRAFT  09/2005   Post op AFIB  . EYE SURGERY    . FOOT BONE EXCISION Left 11/03/2013   DR DUDA   . I&D EXTREMITY Left 11/03/2013   Procedure: Left Foot Partial Bone Excision Cuboid and Medial Cuneiform, Wound Closures;  Surgeon: [name], MD;  Location: [facility];  Service: Orthopedics;  Laterality: Left;  . INSERTION PROSTATE RADIATION SEED  2009   RT and seeds for prostate cancer  . KIDNEY STONE SURGERY  04/1993  . RCA stents  04/2003   EF 55%  . RETINAL DETACHMENT SURGERY  2002-2003  . thrombosed vein  1993   Right leg    There were no vitals filed for this visit.      Subjective Assessment - 05/27/16 1447    Subjective Went to prosthetist today and had adjustments made to socket and pylon.  Reports pylon was lengthened to decrease leg length discrepency and reduce lateral trunk lean, angle of pylon was adjusted to shift COG posterior;  pads added to inner socket at medial and lateral tibia.  Pt currently wearing 3 ply sock and reports feeling very loose in socket.     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 No/denies            [facility] PT Assessment - 05/27/16 1520      Standardized Balance Assessment   Standardized Balance Assessment Timed Up and Go Test     Timed Up and Go Test   TUG Normal TUG   Normal TUG (seconds) 16.78  3rd repetition; close supervision with turns   TUG Comments >19 indicative of high falls risk                     [facility] Adult PT Treatment/Exercise - 05/27/16 1447      Ambulation/Gait   Ramp 4: Min assist   Ramp Details (indicate cue type and reason) x 3 reps with focus on safe technique with RW and safe stepping technique and increased upright posture when ascending and descending ramp     Prosthetics   Prosthetic Care Comments  Pt noted to be rubbing limb dry with cloth; instructed to pat limb dry to avoid damage to skin.  Continues to wear sweat band around upper thigh which was noted to be very wet.  Recommended pt cut a second band and switch band out mid day.  Also instructed in use of anti-perspirant on limb (avoiding open areas) to further control perspiration.  Instructed pt to return to 8-9 ply socks due to loose fit, rubbing and small blister on anterior limb.  Recommended pt continue with 3 hour wear time for a few more days to assess tolerance to adjustments made to prosthesis.   Current prosthetic wear tolerance (days/week)  daily   Current prosthetic wear tolerance (#hours/day)  3 hours on, 2 hours off   Edema none noted   Residual limb condition  new small blister, skin not intact on anterior tibia.  Covered with tegaderm and instructed pt and fiance on when to remove and recover blister   Education Provided Skin check;Residual limb care;Correct ply sock adjustment;Proper wear schedule/adjustment   Person(s) Educated Patient;Other (comment)  fiance   Education Method Demonstration;Explanation   Education Method Verbalized understanding;Returned demonstration   Donning Prosthesis  Modified independent (device/increased time)   Doffing Prosthesis Modified independent (device/increased time)                PT Education - 05/28/16 0749    Education provided Yes   Education Details see prosthetics section   Person(s) Educated Patient;Other (comment)   Methods Explanation;Demonstration   Comprehension Verbalized understanding;Returned demonstration          PT Short Term Goals - 05/16/16 1631      PT SHORT TERM GOAL #1   Title Patient will verbalize understanding of proper cleaning & demonstrates proper donning & doffing of prosthesis. (TARGET DATE: 06/14/2016)    Time 4   Period Weeks   Status New     PT SHORT TERM GOAL #2   Title Patient  report he is wearing the prosthesis > or = 10 hrs total /day without increased skin breakdown. (TARGET DATE: 06/14/2016)    Time 4   Period Weeks   Status New     PT SHORT TERM GOAL #3   Title Patient will demonstrate ability to ambulate 150' on level, indoor surfaces with RW and prosthesis with supervision. (TARGET DATE: 06/14/2016)    Time 4   Period Weeks   Status New     PT SHORT TERM GOAL #4   Title Patient demonstrates standing balance with rolling walker & prosthesis reaching 10" anteriorly, picks up object off floor and manages pants for toileting. (TARGET DATE: 06/14/2016)    Time 4   Period Weeks   Status New     PT SHORT TERM GOAL #5   Title Patient will verbalize understanding and return demonstration for initial HEP. (TARGET DATE: 06/14/2016)    Time 4   Period Weeks   Status New     Additional Short Term Goals   Additional Short Term Goals Yes     PT SHORT TERM GOAL #6   Title Patient negotiates stairs (2 rails), ramp & curb with RW & prosthesis with minimal assist to indicate a decrease in his risk of falling. (TARGET DATE: 06/14/2016)    Time 4   Period Weeks   Status New           PT Long Term Goals - 05/27/16 1536      PT LONG TERM GOAL #1   Title Patient demonstrates & verbalizes  proper prosthetic care to enable safe use of prostheis without skin issues or pain.  (Target Date: 07/12/2016)   Time 8   Period Weeks   Status On-going     PT LONG TERM GOAL #2   Title Patient will score >/= 30/56 on the Berg Balance Test to indicate a decrease in his risk of falling. (TARGET DATE: 07/12/2016)    Time 8   Period Weeks   Status On-going     PT LONG TERM GOAL #3   Title Pt will improve TUG to < or = 13.5 requiring distant supervision during sit > stand and R/L turns (TARGET DATE: 07/12/2016)    Time 8   Period Weeks   Status New     PT LONG TERM GOAL #4   Title Patient ambulates 250' with rolling walker & prosthesis modified independent to enable community mobility.  (Target Date: 07/12/2016)   Time 8   Period Weeks   Status On-going     PT LONG TERM GOAL #5   Title Patient negotiates ramps, curbs with RW & stairs with 2 rails or 1 rail / AD with prosthesis modified independent to enable community access.  (Target Date: 07/12/2016)   Time 8   Period Weeks   Status On-going     PT LONG TERM GOAL #6   Title Patient tolerates wear of prosthesis >90% of awake hours without skin issues or limb pain to enable function throughout day. (TARGET DATE: 07/12/2016)    Time 8   Period Weeks   Status On-going               Plan - 05/28/16 0750    Clinical Impression Statement Treatment session today with majority of focus on continued education regarding perspiration management, appropriate sock ply to improve fit and decrease rubbing/friction, skin care, gait training on ramp and assessment of falls risk with TUG.  TUG time of 16.78 seconds is  above cut off score for high falls risk but pt did demonstrate instability and decreased balance during initial sit> stand and during R and L turns-required close supervision.  LTG set.  Will continue to progress towards LTG.   Rehab Potential Good   Clinical Impairments Affecting Rehab Potential coronary atherosclerosis, DM type 2  (insulin dependent; diagnosed 2008), malignant neoplasm of prostate, colonic polyps, glaucoma, sleep apnea, venous insufficiency, HTN, L BKA (02/23/2016) due to Charcot deformity, L ray amputation (12/30/2012), OA, peripheral neuropathy, CABG (2008), retinal detachment   PT Treatment/Interventions ADLs/Self Care Home Management;Neuromuscular re-education;Balance training;Therapeutic exercise;Therapeutic activities;Functional mobility training;Stair training;Gait training;DME Instruction;Patient/family education;Prosthetic Training;Passive range of motion   PT Next Visit Plan prosthetic gait with RW work on distance and correction of gait deviations, ramp negotiation, L and R turns, variety of surface, curb negotiation, standing balance with decreased UE support   Consulted and Agree with Plan of Care Patient;Family member/caregiver   Family Member Consulted Fiance      Patient will benefit from skilled therapeutic intervention in order to improve the following deficits and impairments:  Abnormal gait, Decreased activity tolerance, Decreased balance, Decreased coordination, Decreased safety awareness, Decreased range of motion, Decreased mobility, Decreased knowledge of use of DME, Decreased endurance, Decreased strength, Difficulty walking, Impaired flexibility, Improper body mechanics, Postural dysfunction, Prosthetic Dependency  Visit Diagnosis: Other abnormalities of gait and mobility  Unsteadiness on feet  History of falling  Muscle weakness (generalized)  Stiffness of left knee, not elsewhere classified  Stiffness of right knee, not elsewhere classified     Problem List Patient Active Problem List   Diagnosis Date Noted  . Status post below knee amputation of left lower extremity ([place]) 02/23/2016  . Type 2 diabetes mellitus with diabetic polyneuropathy, with long-term current use of insulin ([place]) 03/29/2015  . Advance directive discussed with patient 02/07/2014  . Normocytic  anemia 04/01/2013  . Diabetic Charcot foot ([place]) 04/01/2013  . BMI 40.0-44.9, adult ([place]) 12/30/2012  . Routine general medical examination at a health care facility 12/25/2010  . HEMORRHOIDS-INTERNAL 11/22/2009  . PERSONAL HX COLONIC POLYPS 11/22/2009  . VENOUS INSUFFICIENCY 06/29/2008  . Osteoarthritis, multiple sites 04/03/2007  . ERECTILE DYSFUNCTION, ORGANIC 06/13/2006  . Hyperlipemia 06/11/2006  . GLAUCOMA 06/11/2006  . Coronary atherosclerosis of native coronary artery 06/11/2006  . Obstructive sleep apnea 06/11/2006   Raylene Everts, PT, DPT 05/28/16    7:58 AM    [facility] [address] [place], [place], [address] Phone: [phone]   Fax:  [phone]  Name: [name] MRN: [id number] Date of Birth: May 11, 1950

## 2016-05-30 ENCOUNTER — Ambulatory Visit: Payer: Medicare Other | Admitting: Physical Therapy

## 2016-05-30 ENCOUNTER — Encounter: Payer: Self-pay | Admitting: Physical Therapy

## 2016-05-30 DIAGNOSIS — R2689 Other abnormalities of gait and mobility: Secondary | ICD-10-CM

## 2016-05-30 DIAGNOSIS — R2681 Unsteadiness on feet: Secondary | ICD-10-CM

## 2016-05-30 DIAGNOSIS — M25661 Stiffness of right knee, not elsewhere classified: Secondary | ICD-10-CM | POA: Diagnosis not present

## 2016-05-30 DIAGNOSIS — M25662 Stiffness of left knee, not elsewhere classified: Secondary | ICD-10-CM | POA: Diagnosis not present

## 2016-05-30 DIAGNOSIS — M6281 Muscle weakness (generalized): Secondary | ICD-10-CM | POA: Diagnosis not present

## 2016-05-30 DIAGNOSIS — Z9181 History of falling: Secondary | ICD-10-CM

## 2016-05-30 NOTE — Therapy (Signed)
[facility] [address] [place], [place], [address] Phone: [phone]   Fax:  [phone]  Physical Therapy Treatment  Patient Details  Name: [name] MRN: [id number] Date of Birth: [birth year] Referring Provider: Dondra Prader NP   Encounter Date: 05/30/2016      PT End of Session - 05/30/16 0955    Visit Number 5   Number of Visits 17   Date for PT Re-Evaluation 07/12/16   Authorization Type Medicare & G-codes every 10th visit    PT Start Time 0930   PT Stop Time 0955   PT Time Calculation (min) 25 min   Activity Tolerance Patient tolerated treatment well   Behavior During Therapy [facility] for tasks assessed/performed      Past Medical History:  Diagnosis Date  . Coronary atherosclerosis of unspecified type of vessel, native or graft   . Diabetes mellitus without complication ([place])   . Hemorrhage of rectum and anus   . History of kidney stones   . Impotence of organic origin   . Internal hemorrhoids without mention of complication   . Malignant neoplasm of prostate ([place])   . Mononeuritis of unspecified site   . Osteoarthrosis, unspecified whether generalized or localized, unspecified site   . Other and unspecified hyperlipidemia   . Personal history of colonic polyps   . Personal history of diabetic foot ulcer    saw wound center, resolved 05/08/2010  . Personal history of gallstones   . Routine general medical examination at a health care facility   . Type II or unspecified type diabetes mellitus with neurological manifestations, not stated as uncontrolled(250.60)   . Unspecified glaucoma(365.9)   . Unspecified sleep apnea    cpcp  . Unspecified venous (peripheral) insufficiency     Past Surgical History:  Procedure Laterality Date  . AMPUTATION Left 12/30/2012   Procedure: AMPUTATION RAY ;  Surgeon: Meredith Pel, MD;  Location[facility];  Service: Orthopedics;  Laterality: Left;  LEFT GREAT TOE  RAY AMPUTATION  . AMPUTATION Left 02/23/2016   Procedure: Left Below Knee Amputation;  Surgeon: [name], MD;  Location: [facility];  Service: Orthopedics;  Laterality: Left;  . APPENDECTOMY    . BASAL CELL CARCINOMA EXCISION  2/16   left forearm  . CARDIAC CATHETERIZATION  1998   Negative  . CATARACT EXTRACTION Right 2017   then lid surgery  . CORONARY ARTERY BYPASS GRAFT  09/2005   Post op AFIB  . EYE SURGERY    . FOOT BONE EXCISION Left 11/03/2013   DR DUDA   . I&D EXTREMITY Left 11/03/2013   Procedure: Left Foot Partial Bone Excision Cuboid and Medial Cuneiform, Wound Closures;  Surgeon: [name], MD;  Location: [facility];  Service: Orthopedics;  Laterality: Left;  . INSERTION PROSTATE RADIATION SEED  2009   RT and seeds for prostate cancer  . KIDNEY STONE SURGERY  04/1993  . RCA stents  04/2003   EF 55%  . RETINAL DETACHMENT SURGERY  2002-2003  . thrombosed vein  1993   Right leg    There were no vitals filed for this visit.      Subjective Assessment - 05/30/16 0938    Subjective Patient presented to PT today in manual wheelchair with prosthesis in his lap due to reports of a large, new blister on distal end of residual limb.    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 No/denies                         [facility] Adult PT Treatment/Exercise - 05/30/16 0930      Ambulation/Gait   Ramp --     Self-Care   Self-Care Other Self-Care Comments   Other  Self-Care Comments  PT educated patient on proper leg elevation, wound care management for R hallux, and signs of infection.      Prosthetics   Prosthetic Care Comments  Patient reports using antiperspirant on residual limb. Due to large blister, PT educated patient to not don liner or prosthesis. PT instructed patient to wear shrinker sock.  PT instructed patient to not pop the blister.   Current prosthetic wear tolerance (days/week)  currently not wearing prosthesis due to new, large blister on distal residual limb   Current prosthetic wear tolerance (#hours/day)  currently not wearing prosthesis due to new, large blister on distal residual limb   Edema none noted   Residual limb condition  New (first noted on 05/29/2016), large blister on distal end of L residual limb. Fluid is visible within large blister. Wound on distal tibia just superior to incision appears to be healing well, and scab is forming. R foot: wound inferior/lateral hallux from cutting foot on broken tile in bathroom approximately 2-3 weeks ago. Patient reports the wound has not gotten worse, but is has not shown signs of healing, either. Wound on top of hallux due to patient scratching the top of his toe when trying to remove bandaid approximately 3 days ago. Wound is not becoming worse, but is also not showing signs of healing.      Education Provided Skin check;Residual limb care;Proper wear schedule/adjustment   Person(s) Educated Patient;Spouse   Education Method Explanation;Demonstration;Tactile cues;Verbal cues   Education Method Verbalized understanding;Verbal cues required;Needs further instruction                PT Education - 05/30/16 0939    Education provided Yes   Education Details see prosthetics section    Person(s) Educated Patient;Spouse   Methods Explanation;Demonstration   Comprehension Verbalized understanding;Need further instruction          PT Short Term Goals - 05/16/16 1631      PT  SHORT TERM GOAL #1   Title Patient will verbalize understanding of proper cleaning & demonstrates proper donning & doffing of prosthesis. (TARGET DATE: 06/14/2016)    Time 4   Period Weeks   Status New     PT SHORT TERM GOAL #2   Title Patient report he is wearing the prosthesis > or = 10 hrs total /day without increased skin breakdown. (TARGET DATE: 06/14/2016)    Time 4   Period Weeks   Status New     PT SHORT TERM GOAL #3   Title Patient will demonstrate ability to ambulate 150' on level, indoor surfaces with RW and prosthesis with supervision. (TARGET DATE: 06/14/2016)    Time 4   Period Weeks   Status New     PT SHORT TERM GOAL #4   Title Patient  demonstrates standing balance with rolling walker & prosthesis reaching 10" anteriorly, picks up object off floor and manages pants for toileting. (TARGET DATE: 06/14/2016)    Time 4   Period Weeks   Status New     PT SHORT TERM GOAL #5   Title Patient will verbalize understanding and return demonstration for initial HEP. (TARGET DATE: 06/14/2016)    Time 4   Period Weeks   Status New     Additional Short Term Goals   Additional Short Term Goals Yes     PT SHORT TERM GOAL #6   Title Patient negotiates stairs (2 rails), ramp & curb with RW & prosthesis with minimal assist to indicate a decrease in his risk of falling. (TARGET DATE: 06/14/2016)    Time 4   Period Weeks   Status New           PT Long Term Goals - 05/27/16 1536      PT LONG TERM GOAL #1   Title Patient demonstrates & verbalizes proper prosthetic care to enable safe use of prostheis without skin issues or pain.  (Target Date: 07/12/2016)   Time 8   Period Weeks   Status On-going     PT LONG TERM GOAL #2   Title Patient will score >/= 30/56 on the Berg Balance Test to indicate a decrease in his risk of falling. (TARGET DATE: 07/12/2016)    Time 8   Period Weeks   Status On-going     PT LONG TERM GOAL #3   Title Pt will improve TUG to < or = 13.5 requiring distant  supervision during sit > stand and R/L turns (TARGET DATE: 07/12/2016)    Time 8   Period Weeks   Status New     PT LONG TERM GOAL #4   Title Patient ambulates 250' with rolling walker & prosthesis modified independent to enable community mobility.  (Target Date: 07/12/2016)   Time 8   Period Weeks   Status On-going     PT LONG TERM GOAL #5   Title Patient negotiates ramps, curbs with RW & stairs with 2 rails or 1 rail / AD with prosthesis modified independent to enable community access.  (Target Date: 07/12/2016)   Time 8   Period Weeks   Status On-going     PT LONG TERM GOAL #6   Title Patient tolerates wear of prosthesis >90% of awake hours without skin issues or limb pain to enable function throughout day. (TARGET DATE: 07/12/2016)    Time 8   Period Weeks   Status On-going               Plan - 05/30/16 1518    Clinical Impression Statement Today's skilled PT session focused on addressing wound care education as it relates to prosthetic wear, and self-care due to a wound on his R foot. Today's PT session was limited due to patient's inability to don his prosthesis due to large blister on distal residual limb. PT provided education related to wound care. PT will continue to monitor wound and treat as appropriate.     Rehab Potential Good   Clinical Impairments Affecting Rehab Potential coronary atherosclerosis, DM type 2 (insulin dependent; diagnosed 2008), malignant neoplasm of prostate, colonic polyps, glaucoma, sleep apnea, venous insufficiency, HTN, L BKA (02/23/2016) due to Charcot deformity, L ray amputation (12/30/2012), OA, peripheral neuropathy, CABG (2008), retinal detachment   PT Treatment/Interventions ADLs/Self Care Home Management;Neuromuscular re-education;Balance training;Therapeutic exercise;Therapeutic activities;Functional mobility training;Stair training;Gait training;DME  Instruction;Patient/family education;Prosthetic Training;Passive range of motion   PT Next  Visit Plan monitor and assess status of wound on residual limb; advance prosthetic gait as appropriate based on status of patient's wounds    Consulted and Agree with Plan of Care Patient;Family member/caregiver   Family Member Consulted Fiance      Patient will benefit from skilled therapeutic intervention in order to improve the following deficits and impairments:  Abnormal gait, Decreased activity tolerance, Decreased balance, Decreased coordination, Decreased safety awareness, Decreased range of motion, Decreased mobility, Decreased knowledge of use of DME, Decreased endurance, Decreased strength, Difficulty walking, Impaired flexibility, Improper body mechanics, Postural dysfunction, Prosthetic Dependency  Visit Diagnosis: Other abnormalities of gait and mobility  Unsteadiness on feet  History of falling  Muscle weakness (generalized)     Problem List Patient Active Problem List   Diagnosis Date Noted  . Status post below knee amputation of left lower extremity ([place]) 02/23/2016  . Type 2 diabetes mellitus with diabetic polyneuropathy, with long-term current use of insulin ([place]) 03/29/2015  . Advance directive discussed with patient 02/07/2014  . Normocytic anemia 04/01/2013  . Diabetic Charcot foot ([place]) 04/01/2013  . BMI 40.0-44.9, adult ([place]) 12/30/2012  . Routine general medical examination at a health care facility 12/25/2010  . HEMORRHOIDS-INTERNAL 11/22/2009  . PERSONAL HX COLONIC POLYPS 11/22/2009  . VENOUS INSUFFICIENCY 06/29/2008  . Osteoarthritis, multiple sites 04/03/2007  . ERECTILE DYSFUNCTION, ORGANIC 06/13/2006  . Hyperlipemia 06/11/2006  . GLAUCOMA 06/11/2006  . Coronary atherosclerosis of native coronary artery 06/11/2006  . Obstructive sleep apnea 06/11/2006    Arelia Sneddon, SPT  05/30/2016, 3:20 PM  [facility] [address] [place], [place], [address] Phone: [phone]   Fax:   [phone]  Name: [name] MRN: [id number] Date of Birth: [birth year]

## 2016-05-31 ENCOUNTER — Telehealth (INDEPENDENT_AMBULATORY_CARE_PROVIDER_SITE_OTHER): Payer: Self-pay | Admitting: Radiology

## 2016-05-31 MED ORDER — MUPIROCIN 2 % EX OINT: TOPICAL_OINTMENT | CUTANEOUS | 0 refills | Status: DC

## 2016-05-31 MED ORDER — DOXYCYCLINE HYCLATE 100 MG PO TABS: 100.0000 mg | ORAL_TABLET | Freq: Two times a day (BID) | ORAL | 0 refills | Status: DC

## 2016-05-31 NOTE — Telephone Encounter (Signed)
Patient has been wearing prosthetic and developed blister on stump. Would like to know what he should do and if there is something that he can put on it?

## 2016-05-31 NOTE — Telephone Encounter (Signed)
Per Dondra Prader, NP Call in Doxycycline 100mg  1 po bid #28 Also call in Mupirocin ointment Make patient appt for Tuesday.   I called and advised. Scripts will be called to pharmacy. [facility]. Patient worked in to Dr. Jess Barters schedule at 3:00pm Tues 5/29

## 2016-06-04 ENCOUNTER — Ambulatory Visit (INDEPENDENT_AMBULATORY_CARE_PROVIDER_SITE_OTHER): Payer: Medicare Other | Admitting: Orthopedic Surgery

## 2016-06-04 ENCOUNTER — Other Ambulatory Visit: Payer: Self-pay

## 2016-06-04 ENCOUNTER — Telehealth: Payer: Self-pay | Admitting: Internal Medicine

## 2016-06-04 DIAGNOSIS — Z794 Long term (current) use of insulin: Secondary | ICD-10-CM | POA: Diagnosis not present

## 2016-06-04 DIAGNOSIS — L97221 Non-pressure chronic ulcer of left calf limited to breakdown of skin: Secondary | ICD-10-CM | POA: Diagnosis not present

## 2016-06-04 DIAGNOSIS — I87331 Chronic venous hypertension (idiopathic) with ulcer and inflammation of right lower extremity: Secondary | ICD-10-CM | POA: Insufficient documentation

## 2016-06-04 DIAGNOSIS — I87321 Chronic venous hypertension (idiopathic) with inflammation of right lower extremity: Secondary | ICD-10-CM

## 2016-06-04 DIAGNOSIS — E1142 Type 2 diabetes mellitus with diabetic polyneuropathy: Secondary | ICD-10-CM | POA: Diagnosis not present

## 2016-06-04 DIAGNOSIS — I2510 Atherosclerotic heart disease of native coronary artery without angina pectoris: Secondary | ICD-10-CM

## 2016-06-04 DIAGNOSIS — L97919 Non-pressure chronic ulcer of unspecified part of right lower leg with unspecified severity: Secondary | ICD-10-CM

## 2016-06-04 MED ORDER — GLUCOSE BLOOD VI STRP
1.0000 | ORAL_STRIP | 5 refills | Status: DC
Start: 2016-06-04 — End: 2016-06-13

## 2016-06-04 NOTE — Telephone Encounter (Signed)
Patient states that [name] will not send test strips. He needs glucose blood (FREESTYLE INSULINX TEST) test strip [[id number]]   [facility]

## 2016-06-04 NOTE — Telephone Encounter (Signed)
Submitted

## 2016-06-04 NOTE — Progress Notes (Signed)
Office Visit Note   Patient: [name]           Date of Birth: October 14, 1950           MRN: [id number] Visit Date: 06/04/2016              Requested by: [name], MD [address] [place], [place] [address] PCP: [name], MD  Chief Complaint  Patient presents with  . Left Leg - Follow-up    02/23/16 L BKA new distal blister residual limb       HPI: Patient presents with increasing drainage in bearing ulcer left transtibial amputation as well as an ulcer on the right great toe. Patient has been using Bactroban dressing changes and has been on doxycycline.  Assessment & Plan: Visit Diagnoses:  1. Non-pressure chronic ulcer of left calf, limited to breakdown of skin ([place])   2. Type 2 diabetes mellitus with diabetic polyneuropathy, with long-term current use of insulin (HCC)     Plan: Recommended discontinuing the doxycycline. We'll have him use Silvadene on the wound plus 4 x 4 pluses stump shrinker recommended elevation. Continue the Band-Aid and the right great toe. Discontinue therapy until the ulcer is healed. Discontinue use of the prosthesis until the ulcer is healed.  Follow-Up Instructions: Return in about 2 weeks (around 06/18/2016).   Ortho Exam  Patient is alert, oriented, no adenopathy, well-dressed, normal affect, normal respiratory effort. On examination patient has a superficial in bearing ulcer on the left transtibial amputation. The ulcer is 5 cm in diameter 0.1 mm deep. There is no cellulitis there is no depth there is no tunneling there is clear drainage. No signs of infection. Examination of the great toe he has a 3 mm ulcer which also has no depth. There is no cellulitis in the right foot. He does have venous stasis changes in the right leg and recommended that he wear the compression stocking at all times.  Imaging: No results found.  Labs: Lab Results  Component Value Date   HGBA1C 7.3 05/13/2016   HGBA1C 6.1 02/14/2016   HGBA1C 7.7 12/04/2015   ESRSEDRATE 35 (H) 03/31/2013   CRP 5.3 (H) 03/31/2013   REPTSTATUS 01/02/2013 FINAL 12/30/2012   GRAMSTAIN  12/30/2012    FEW WBC PRESENT, PREDOMINANTLY PMN RARE SQUAMOUS EPITHELIAL CELLS PRESENT ABUNDANT GRAM NEGATIVE RODS MODERATE GRAM POSITIVE COCCI IN PAIRS IN CLUSTERS   CULT  12/30/2012    MULTIPLE ORGANISMS PRESENT, NONE PREDOMINANT NO STAPHYLOCOCCUS AUREUS ISOLATED NO GROUP A STREP (S.PYOGENES) ISOLATED Performed at [facility] 12/15/2012   LABORGA PSEUDOMONAS AERUGINOSA 12/15/2012    Orders:  No orders of the defined types were placed in this encounter.  No orders of the defined types were placed in this encounter.    Procedures: No procedures performed  Clinical Data: No additional findings.  ROS:  All other systems negative, except as noted in the HPI. Review of Systems  Objective: Vital Signs: There were no vitals taken for this visit.  Specialty Comments:  No specialty comments available.  PMFS History: Patient Active Problem List   Diagnosis Date Noted  . Non-pressure chronic ulcer of left calf, limited to breakdown of skin ([place]) 06/04/2016  . Status post below knee amputation of left lower extremity ([place]) 02/23/2016  . Type 2 diabetes mellitus with diabetic polyneuropathy, with long-term current use of insulin ([place]) 03/29/2015  . Advance directive discussed with patient 02/07/2014  . Normocytic  anemia 04/01/2013  . Diabetic Charcot foot ([place]) 04/01/2013  . BMI 40.0-44.9, adult ([place]) 12/30/2012  . Routine general medical examination at a health care facility 12/25/2010  . HEMORRHOIDS-INTERNAL 11/22/2009  . PERSONAL HX COLONIC POLYPS 11/22/2009  . VENOUS INSUFFICIENCY 06/29/2008  . Osteoarthritis, multiple sites 04/03/2007  . ERECTILE DYSFUNCTION, ORGANIC 06/13/2006  . Hyperlipemia 06/11/2006  . GLAUCOMA 06/11/2006  . Coronary atherosclerosis of native coronary artery 06/11/2006  .  Obstructive sleep apnea 06/11/2006   Past Medical History:  Diagnosis Date  . Coronary atherosclerosis of unspecified type of vessel, native or graft   . Diabetes mellitus without complication ([place])   . Hemorrhage of rectum and anus   . History of kidney stones   . Impotence of organic origin   . Internal hemorrhoids without mention of complication   . Malignant neoplasm of prostate ([place])   . Mononeuritis of unspecified site   . Osteoarthrosis, unspecified whether generalized or localized, unspecified site   . Other and unspecified hyperlipidemia   . Personal history of colonic polyps   . Personal history of diabetic foot ulcer    saw wound center, resolved 05/08/2010  . Personal history of gallstones   . Routine general medical examination at a health care facility   . Type II or unspecified type diabetes mellitus with neurological manifestations, not stated as uncontrolled(250.60)   . Unspecified glaucoma(365.9)   . Unspecified sleep apnea    cpcp  . Unspecified venous (peripheral) insufficiency     Family History  Problem Relation Age of Onset  . Lung cancer Father   . Multiple sclerosis Mother   . Heart attack Unknown        paternal aunts and uncles  . Peripheral vascular disease Maternal Grandfather        several amputations    Past Surgical History:  Procedure Laterality Date  . AMPUTATION Left 12/30/2012   Procedure: AMPUTATION RAY ;  Surgeon: Meredith Pel, MD;  Location: [facility];  Service: Orthopedics;  Laterality: Left;  LEFT GREAT TOE RAY AMPUTATION  . AMPUTATION Left 02/23/2016   Procedure: Left Below Knee Amputation;  Surgeon: [name], MD;  Location: [facility];  Service: Orthopedics;  Laterality: Left;  . APPENDECTOMY    . BASAL CELL CARCINOMA EXCISION  2/16   left forearm  . CARDIAC CATHETERIZATION  1998   Negative  . CATARACT EXTRACTION Right 2017   then lid surgery  . CORONARY ARTERY BYPASS GRAFT  09/2005   Post op AFIB  . EYE SURGERY    . FOOT  BONE EXCISION Left 11/03/2013   DR Darius Fillingim   . I&D EXTREMITY Left 11/03/2013   Procedure: Left Foot Partial Bone Excision Cuboid and Medial Cuneiform, Wound Closures;  Surgeon: [name], MD;  Location: [facility];  Service: Orthopedics;  Laterality: Left;  . INSERTION PROSTATE RADIATION SEED  2009   RT and seeds for prostate cancer  . KIDNEY STONE SURGERY  04/1993  . RCA stents  04/2003   EF 55%  . RETINAL DETACHMENT SURGERY  2002-2003  . thrombosed vein  1993   Right leg   Social History   Occupational History  . Heavy equipment mechanic--retired    Social History Main Topics  . Smoking status: Former Smoker    Packs/day: 2.00    Years: 35.00    Types: Cigars, Cigarettes    Quit date: 01/07/2001  . Smokeless tobacco: Never Used  . Alcohol use Yes     Comment: rare  .  Drug use: No  . Sexual activity: Yes    Partners: Female

## 2016-06-05 ENCOUNTER — Encounter: Payer: Medicare Other | Admitting: Physical Therapy

## 2016-06-07 ENCOUNTER — Encounter: Payer: Medicare Other | Admitting: Physical Therapy

## 2016-06-07 NOTE — Addendum Note (Signed)
Addendum  created 06/07/16 1139 by Rica Koyanagi, MD   Sign clinical note

## 2016-06-10 ENCOUNTER — Telehealth: Payer: Self-pay | Admitting: Internal Medicine

## 2016-06-10 NOTE — Telephone Encounter (Signed)
Rep faxing Drug therapy alert.  Thank you,  -LL

## 2016-06-11 ENCOUNTER — Encounter: Payer: Medicare Other | Admitting: Physical Therapy

## 2016-06-13 ENCOUNTER — Telehealth: Payer: Self-pay | Admitting: Internal Medicine

## 2016-06-13 ENCOUNTER — Other Ambulatory Visit: Payer: Self-pay

## 2016-06-13 ENCOUNTER — Encounter: Payer: Medicare Other | Admitting: Physical Therapy

## 2016-06-13 MED ORDER — GLUCOSE BLOOD VI STRP
ORAL_STRIP | 5 refills | Status: DC
Start: 2016-06-13 — End: 2016-06-17

## 2016-06-13 MED ORDER — ACCU-CHEK GUIDE W/DEVICE KIT: 1.0000 | PACK | Freq: Every day | 0 refills | Status: DC

## 2016-06-13 MED ORDER — ACCU-CHEK FASTCLIX LANCETS MISC: 5 refills | Status: DC

## 2016-06-13 NOTE — Telephone Encounter (Signed)
Patient's wife called to advise that "Accucheck", "TruTest" and "Prodigy" would be covered by medicare.

## 2016-06-13 NOTE — Telephone Encounter (Signed)
Patients wife called to advise that glucose blood (FREESTYLE INSULINX TEST) test strip [[id number]] is no longer being covered by medicare,. They are asking that a new alternative be submitted. I also advised that they call medicare to ask what is on the formulary

## 2016-06-13 NOTE — Telephone Encounter (Signed)
Submitted in accu chek meter, strips and lancets to pharamcy.

## 2016-06-14 NOTE — Telephone Encounter (Signed)
Pt called stating his diabetes testing supplies were not available [facility]. After looking at Pts chart it looks like supply request was sent to [facility]. Pt is [facility] Thanks. BET   [id number], [place] [facility] [place] [phone] (Phone) [phone] (Fax)

## 2016-06-17 ENCOUNTER — Other Ambulatory Visit: Payer: Self-pay

## 2016-06-17 ENCOUNTER — Encounter: Payer: Medicare Other | Admitting: Physical Therapy

## 2016-06-17 MED ORDER — ACCU-CHEK FASTCLIX LANCETS MISC: 5 refills | Status: DC

## 2016-06-17 MED ORDER — GLUCOSE BLOOD VI STRP: ORAL_STRIP | 5 refills | Status: DC

## 2016-06-17 NOTE — Telephone Encounter (Signed)
[facility]

## 2016-06-18 DIAGNOSIS — H02052 Trichiasis without entropian right lower eyelid: Secondary | ICD-10-CM | POA: Diagnosis not present

## 2016-06-19 ENCOUNTER — Ambulatory Visit (INDEPENDENT_AMBULATORY_CARE_PROVIDER_SITE_OTHER): Payer: Medicare Other | Admitting: Orthopedic Surgery

## 2016-06-19 ENCOUNTER — Encounter (INDEPENDENT_AMBULATORY_CARE_PROVIDER_SITE_OTHER): Payer: Self-pay | Admitting: Orthopedic Surgery

## 2016-06-19 DIAGNOSIS — Z89512 Acquired absence of left leg below knee: Secondary | ICD-10-CM | POA: Diagnosis not present

## 2016-06-19 DIAGNOSIS — Z794 Long term (current) use of insulin: Secondary | ICD-10-CM

## 2016-06-19 DIAGNOSIS — E1142 Type 2 diabetes mellitus with diabetic polyneuropathy: Secondary | ICD-10-CM | POA: Diagnosis not present

## 2016-06-19 DIAGNOSIS — L97221 Non-pressure chronic ulcer of left calf limited to breakdown of skin: Secondary | ICD-10-CM

## 2016-06-19 DIAGNOSIS — I2510 Atherosclerotic heart disease of native coronary artery without angina pectoris: Secondary | ICD-10-CM

## 2016-06-19 NOTE — Progress Notes (Signed)
Office Visit Note   Patient: [name]           Date of Birth: [birth year]           MRN: [id number] Visit Date: 06/19/2016              Requested by: [name], MD [address] [place], [place] [address] PCP: [name], MD  Chief Complaint  Patient presents with  . Left Leg - Follow-up      HPI: Patient presents in follow-up for ulcer left transtibial amputation. Patient also has venous stasis swelling in the right leg and has cramping in his legs at night. He states the cramping has been better with using coconut water.  Assessment & Plan: Visit Diagnoses:  1. Non-pressure chronic ulcer of left calf, limited to breakdown of skin ([place])   2. History of left below knee amputation ([place])   3. Type 2 diabetes mellitus with diabetic polyneuropathy, with long-term current use of insulin ([place])     Plan: Recommend that he get another pair of the stump shrinker as so he can change the some shrinker twice a day. He does have increased drainage. Recommend wearing a 15-20 mm compression stocking on the right leg. Recommended about 3 ounces of coconut water a day.  Follow-Up Instructions: Return in about 3 weeks (around 07/10/2016).   Ortho Exam  Patient is alert, oriented, no adenopathy, well-dressed, normal affect, normal respiratory effort. Patient is ambulating in a wheelchair. Recommended he continue with his holding on physical therapy. Examination the ulcer on the residual limb looks a lot better. His possibly 3 cm in diameter there is less drainage. Patient states the wound started looking much better when using the medical compression shrinker. There is no cellulitis no odor no signs of infection. Patient has pitting edema in the right lower extremity.  Imaging: No results found.  Labs: Lab Results  Component Value Date   HGBA1C 7.3 05/13/2016   HGBA1C 6.1 02/14/2016   HGBA1C 7.7 12/04/2015   ESRSEDRATE 35 (H) 03/31/2013   CRP 5.3 (H) 03/31/2013   REPTSTATUS 01/02/2013 FINAL 12/30/2012   GRAMSTAIN  12/30/2012    FEW WBC PRESENT, PREDOMINANTLY PMN RARE SQUAMOUS EPITHELIAL CELLS PRESENT ABUNDANT GRAM NEGATIVE RODS MODERATE GRAM POSITIVE COCCI IN PAIRS IN CLUSTERS   CULT  12/30/2012    MULTIPLE ORGANISMS PRESENT, NONE PREDOMINANT NO STAPHYLOCOCCUS AUREUS ISOLATED NO GROUP A STREP (S.PYOGENES) ISOLATED Performed at [facility] 12/15/2012   LABORGA PSEUDOMONAS AERUGINOSA 12/15/2012    Orders:  No orders of the defined types were placed in this encounter.  No orders of the defined types were placed in this encounter.    Procedures: No procedures performed  Clinical Data: No additional findings.  ROS:  All other systems negative, except as noted in the HPI. Review of Systems  Objective: Vital Signs: There were no vitals taken for this visit.  Specialty Comments:  No specialty comments available.  PMFS History: Patient Active Problem List   Diagnosis Date Noted  . Non-pressure chronic ulcer of left calf, limited to breakdown of skin ([place]) 06/04/2016  . Idiopathic chronic venous hypertension of right lower extremity with inflammation 06/04/2016  . Status post below knee amputation of left lower extremity ([place]) 02/23/2016  . Type 2 diabetes mellitus with diabetic polyneuropathy, with long-term current use of insulin ([place]) 03/29/2015  . Advance directive discussed with patient 02/07/2014  . Normocytic anemia 04/01/2013  .  Diabetic Charcot foot ([place]) 04/01/2013  . BMI 40.0-44.9, adult ([place]) 12/30/2012  . Routine general medical examination at a health care facility 12/25/2010  . HEMORRHOIDS-INTERNAL 11/22/2009  . PERSONAL HX COLONIC POLYPS 11/22/2009  . VENOUS INSUFFICIENCY 06/29/2008  . Osteoarthritis, multiple sites 04/03/2007  . ERECTILE DYSFUNCTION, ORGANIC 06/13/2006  . Hyperlipemia 06/11/2006  . GLAUCOMA 06/11/2006  . Coronary atherosclerosis of native coronary artery  06/11/2006  . Obstructive sleep apnea 06/11/2006   Past Medical History:  Diagnosis Date  . Coronary atherosclerosis of unspecified type of vessel, native or graft   . Diabetes mellitus without complication ([place])   . Hemorrhage of rectum and anus   . History of kidney stones   . Impotence of organic origin   . Internal hemorrhoids without mention of complication   . Malignant neoplasm of prostate ([place])   . Mononeuritis of unspecified site   . Osteoarthrosis, unspecified whether generalized or localized, unspecified site   . Other and unspecified hyperlipidemia   . Personal history of colonic polyps   . Personal history of diabetic foot ulcer    saw wound center, resolved 05/08/2010  . Personal history of gallstones   . Routine general medical examination at a health care facility   . Type II or unspecified type diabetes mellitus with neurological manifestations, not stated as uncontrolled(250.60)   . Unspecified glaucoma(365.9)   . Unspecified sleep apnea    cpcp  . Unspecified venous (peripheral) insufficiency     Family History  Problem Relation Age of Onset  . Lung cancer Father   . Multiple sclerosis Mother   . Heart attack Unknown        paternal aunts and uncles  . Peripheral vascular disease Maternal Grandfather        several amputations    Past Surgical History:  Procedure Laterality Date  . AMPUTATION Left 12/30/2012   Procedure: AMPUTATION RAY ;  Surgeon: Meredith Pel, MD;  [facility];  Service: Orthopedics;  Laterality: Left;  LEFT GREAT TOE RAY AMPUTATION  . AMPUTATION Left 02/23/2016   Procedure: Left Below Knee Amputation;  Surgeon: [name], MD;  Location: [facility];  Service: Orthopedics;  Laterality: Left;  . APPENDECTOMY    . BASAL CELL CARCINOMA EXCISION  2/16   left forearm  . CARDIAC CATHETERIZATION  1998   Negative  . CATARACT EXTRACTION Right 2017   then lid surgery  . CORONARY ARTERY BYPASS GRAFT  09/2005   Post op AFIB  . EYE  SURGERY    . FOOT BONE EXCISION Left 11/03/2013   DR Jenney Brester   . I&D EXTREMITY Left 11/03/2013   Procedure: Left Foot Partial Bone Excision Cuboid and Medial Cuneiform, Wound Closures;  Surgeon: [name], MD;  Location: [facility];  Service: Orthopedics;  Laterality: Left;  . INSERTION PROSTATE RADIATION SEED  2009   RT and seeds for prostate cancer  . KIDNEY STONE SURGERY  04/1993  . RCA stents  04/2003   EF 55%  . RETINAL DETACHMENT SURGERY  2002-2003  . thrombosed vein  1993   Right leg   Social History   Occupational History  . Heavy equipment mechanic--retired    Social History Main Topics  . Smoking status: Former Smoker    Packs/day: 2.00    Years: 35.00    Types: Cigars, Cigarettes    Quit date: 01/07/2001  . Smokeless tobacco: Never Used  . Alcohol use Yes     Comment: rare  . Drug use:  No  . Sexual activity: Yes    Partners: Female

## 2016-06-20 ENCOUNTER — Encounter: Payer: Medicare Other | Admitting: Physical Therapy

## 2016-06-24 ENCOUNTER — Encounter: Payer: Medicare Other | Admitting: Physical Therapy

## 2016-06-25 ENCOUNTER — Other Ambulatory Visit: Payer: Self-pay | Admitting: Internal Medicine

## 2016-06-27 ENCOUNTER — Encounter: Payer: Medicare Other | Admitting: Physical Therapy

## 2016-06-28 ENCOUNTER — Other Ambulatory Visit: Payer: Self-pay

## 2016-06-28 MED ORDER — GLUCOSE BLOOD VI STRP: ORAL_STRIP | 5 refills | Status: DC

## 2016-06-28 MED ORDER — ACCU-CHEK FASTCLIX LANCETS MISC: 5 refills | Status: DC

## 2016-06-28 NOTE — Telephone Encounter (Signed)
[facility] [name].  Diabetic testing supplies.  Thank you,  -LL

## 2016-06-28 NOTE — Telephone Encounter (Signed)
[facility] [place].

## 2016-07-01 ENCOUNTER — Encounter: Payer: Medicare Other | Admitting: Physical Therapy

## 2016-07-03 ENCOUNTER — Other Ambulatory Visit: Payer: Self-pay

## 2016-07-03 ENCOUNTER — Telehealth: Payer: Self-pay | Admitting: Internal Medicine

## 2016-07-03 MED ORDER — ACCU-CHEK FASTCLIX LANCETS MISC: 5 refills | Status: DC

## 2016-07-03 MED ORDER — GLUCOSE BLOOD VI STRP: ORAL_STRIP | 5 refills | Status: DC

## 2016-07-03 NOTE — Telephone Encounter (Signed)
Submitted

## 2016-07-03 NOTE — Telephone Encounter (Signed)
[facility] to advise that [facility] b reg's need to be follow for accucheck guide lancets and test strips. Please resubmit with dx in the sig

## 2016-07-04 ENCOUNTER — Encounter: Payer: Medicare Other | Admitting: Physical Therapy

## 2016-07-05 ENCOUNTER — Other Ambulatory Visit: Payer: Self-pay

## 2016-07-05 MED ORDER — ACCU-CHEK GUIDE W/DEVICE KIT: 1.0000 | PACK | Freq: Three times a day (TID) | 1 refills | Status: DC

## 2016-07-05 MED ORDER — ACCU-CHEK GUIDE W/DEVICE KIT: 1.0000 | PACK | Freq: Every day | 1 refills | Status: DC

## 2016-07-08 ENCOUNTER — Encounter: Payer: Medicare Other | Admitting: Physical Therapy

## 2016-07-08 ENCOUNTER — Ambulatory Visit (INDEPENDENT_AMBULATORY_CARE_PROVIDER_SITE_OTHER): Payer: Medicare Other | Admitting: Orthopedic Surgery

## 2016-07-08 DIAGNOSIS — Z89512 Acquired absence of left leg below knee: Secondary | ICD-10-CM | POA: Diagnosis not present

## 2016-07-08 DIAGNOSIS — L97221 Non-pressure chronic ulcer of left calf limited to breakdown of skin: Secondary | ICD-10-CM

## 2016-07-08 DIAGNOSIS — I2510 Atherosclerotic heart disease of native coronary artery without angina pectoris: Secondary | ICD-10-CM

## 2016-07-08 NOTE — Progress Notes (Signed)
Office Visit Note   Patient: [name]           Date of Birth: 10-Sep-1950           MRN: [id number] Visit Date: 07/08/2016              Requested by: [name], MD [address] [place], [place] [address] PCP: [name], MD  Chief Complaint  Patient presents with  . Left Leg - Follow-up    Blistering resolving  . Right Foot - Follow-up    Right great toe blister, no drainage. Callus. Denies fever or chills.      HPI: Patient presents in follow-up for ulcer left transtibial amputation. Patient also has venous stasis swelling in the right leg with traumatic ulcer anteriorly. Is wearing a Ted hose on the right a Vive shrinker on the left. Has compression garments for the RLE, states are too tight.   Assessment & Plan: Visit Diagnoses:  1. Non-pressure chronic ulcer of left calf, limited to breakdown of skin ([place])   2. Status post below knee amputation of left lower extremity ([place])     Plan: Recommend that he continue with the vive stump shrinker daily with direct skin contact. Recommend wearing a 15-20 mm compression stocking on the right leg. Apply antibacterial ointment daily to traumatic ulcer with bandaid. Wear compression garment to RLE>  Follow-Up Instructions: Return in about 3 weeks (around 07/29/2016).   Ortho Exam  Patient is alert, oriented, no adenopathy, well-dressed, normal affect, normal respiratory effort. Patient is ambulating in a wheelchair. Recommended he continue with his holding on physical therapy. Examination the ulcer on the residual limb looks a lot better. This is now just 6 mm in diameter, graunulation tissue in wound bed. there is less drainage. There is no cellulitis no odor no signs of infection. The right lower extremity has trace edema. There is a 1 cm in diameter traumatic ulceration to right anterior shin. Granulation tissue in wound bed. No drainage. No surrounding erythema or edema. No odor.   Imaging: No results  found.  Labs: Lab Results  Component Value Date   HGBA1C 7.3 05/13/2016   HGBA1C 6.1 02/14/2016   HGBA1C 7.7 12/04/2015   ESRSEDRATE 35 (H) 03/31/2013   CRP 5.3 (H) 03/31/2013   REPTSTATUS 01/02/2013 FINAL 12/30/2012   GRAMSTAIN  12/30/2012    FEW WBC PRESENT, PREDOMINANTLY PMN RARE SQUAMOUS EPITHELIAL CELLS PRESENT ABUNDANT GRAM NEGATIVE RODS MODERATE GRAM POSITIVE COCCI IN PAIRS IN CLUSTERS   CULT  12/30/2012    MULTIPLE ORGANISMS PRESENT, NONE PREDOMINANT NO STAPHYLOCOCCUS AUREUS ISOLATED NO GROUP A STREP (S.PYOGENES) ISOLATED Performed at [facility] 12/15/2012   LABORGA PSEUDOMONAS AERUGINOSA 12/15/2012    Orders:  No orders of the defined types were placed in this encounter.  No orders of the defined types were placed in this encounter.    Procedures: No procedures performed  Clinical Data: No additional findings.  ROS:  All other systems negative, except as noted in the HPI. Review of Systems  Constitutional: Negative for chills and fever.  Skin: Positive for wound. Negative for color change.    Objective: Vital Signs: There were no vitals taken for this visit.  Specialty Comments:  No specialty comments available.  PMFS History: Patient Active Problem List   Diagnosis Date Noted  . Non-pressure chronic ulcer of left calf, limited to breakdown of skin ([place]) 06/04/2016  . Idiopathic chronic venous hypertension  of right lower extremity with inflammation 06/04/2016  . Status post below knee amputation of left lower extremity ([place]) 02/23/2016  . Type 2 diabetes mellitus with diabetic polyneuropathy, with long-term current use of insulin ([place]) 03/29/2015  . Advance directive discussed with patient 02/07/2014  . Normocytic anemia 04/01/2013  . Diabetic Charcot foot ([place]) 04/01/2013  . BMI 40.0-44.9, adult ([place]) 12/30/2012  . Routine general medical examination at a health care facility 12/25/2010  .  HEMORRHOIDS-INTERNAL 11/22/2009  . PERSONAL HX COLONIC POLYPS 11/22/2009  . VENOUS INSUFFICIENCY 06/29/2008  . Osteoarthritis, multiple sites 04/03/2007  . ERECTILE DYSFUNCTION, ORGANIC 06/13/2006  . Hyperlipemia 06/11/2006  . GLAUCOMA 06/11/2006  . Coronary atherosclerosis of native coronary artery 06/11/2006  . Obstructive sleep apnea 06/11/2006   Past Medical History:  Diagnosis Date  . Coronary atherosclerosis of unspecified type of vessel, native or graft   . Diabetes mellitus without complication ([place])   . Hemorrhage of rectum and anus   . History of kidney stones   . Impotence of organic origin   . Internal hemorrhoids without mention of complication   . Malignant neoplasm of prostate ([place])   . Mononeuritis of unspecified site   . Osteoarthrosis, unspecified whether generalized or localized, unspecified site   . Other and unspecified hyperlipidemia   . Personal history of colonic polyps   . Personal history of diabetic foot ulcer    saw wound center, resolved 05/08/2010  . Personal history of gallstones   . Routine general medical examination at a health care facility   . Type II or unspecified type diabetes mellitus with neurological manifestations, not stated as uncontrolled(250.60)   . Unspecified glaucoma(365.9)   . Unspecified sleep apnea    cpcp  . Unspecified venous (peripheral) insufficiency     Family History  Problem Relation Age of Onset  . Lung cancer Father   . Multiple sclerosis Mother   . Heart attack Unknown        paternal aunts and uncles  . Peripheral vascular disease Maternal Grandfather        several amputations    Past Surgical History:  Procedure Laterality Date  . AMPUTATION Left 12/30/2012   Procedure: AMPUTATION RAY ;  Surgeon: Meredith Pel, MD;  [facility];  Service: Orthopedics;  Laterality: Left;  LEFT GREAT TOE RAY AMPUTATION  . AMPUTATION Left 02/23/2016   Procedure: Left Below Knee Amputation;  Surgeon: [name], MD;   Location: [facility];  Service: Orthopedics;  Laterality: Left;  . APPENDECTOMY    . BASAL CELL CARCINOMA EXCISION  2/16   left forearm  . CARDIAC CATHETERIZATION  1998   Negative  . CATARACT EXTRACTION Right 2017   then lid surgery  . CORONARY ARTERY BYPASS GRAFT  09/2005   Post op AFIB  . EYE SURGERY    . FOOT BONE EXCISION Left 11/03/2013   DR DUDA   . I&D EXTREMITY Left 11/03/2013   Procedure: Left Foot Partial Bone Excision Cuboid and Medial Cuneiform, Wound Closures;  Surgeon: [name], MD;  Location: [facility];  Service: Orthopedics;  Laterality: Left;  . INSERTION PROSTATE RADIATION SEED  2009   RT and seeds for prostate cancer  . KIDNEY STONE SURGERY  04/1993  . RCA stents  04/2003   EF 55%  . RETINAL DETACHMENT SURGERY  2002-2003  . thrombosed vein  1993   Right leg   Social History   Occupational History  . Heavy equipment mechanic--retired    Social  History Main Topics  . Smoking status: Former Smoker    Packs/day: 2.00    Years: 35.00    Types: Cigars, Cigarettes    Quit date: 01/07/2001  . Smokeless tobacco: Never Used  . Alcohol use Yes     Comment: rare  . Drug use: No  . Sexual activity: Yes    Partners: Female

## 2016-07-09 ENCOUNTER — Other Ambulatory Visit: Payer: Self-pay | Admitting: Internal Medicine

## 2016-07-09 ENCOUNTER — Other Ambulatory Visit: Payer: Self-pay

## 2016-07-09 MED ORDER — ACCU-CHEK GUIDE W/DEVICE KIT: 1.0000 | PACK | Freq: Three times a day (TID) | 1 refills | Status: DC

## 2016-07-11 ENCOUNTER — Ambulatory Visit (INDEPENDENT_AMBULATORY_CARE_PROVIDER_SITE_OTHER): Payer: Medicare Other | Admitting: Orthopedic Surgery

## 2016-07-11 ENCOUNTER — Ambulatory Visit: Payer: Medicare Other | Admitting: Physical Therapy

## 2016-07-17 DIAGNOSIS — R3121 Asymptomatic microscopic hematuria: Secondary | ICD-10-CM | POA: Diagnosis not present

## 2016-07-17 DIAGNOSIS — C61 Malignant neoplasm of prostate: Secondary | ICD-10-CM | POA: Diagnosis not present

## 2016-07-17 DIAGNOSIS — N481 Balanitis: Secondary | ICD-10-CM | POA: Diagnosis not present

## 2016-07-24 DIAGNOSIS — R3121 Asymptomatic microscopic hematuria: Secondary | ICD-10-CM | POA: Diagnosis not present

## 2016-07-24 DIAGNOSIS — N200 Calculus of kidney: Secondary | ICD-10-CM | POA: Diagnosis not present

## 2016-07-24 DIAGNOSIS — K5730 Diverticulosis of large intestine without perforation or abscess without bleeding: Secondary | ICD-10-CM | POA: Diagnosis not present

## 2016-07-29 ENCOUNTER — Ambulatory Visit (INDEPENDENT_AMBULATORY_CARE_PROVIDER_SITE_OTHER): Payer: Medicare Other | Admitting: Orthopedic Surgery

## 2016-07-29 ENCOUNTER — Encounter (INDEPENDENT_AMBULATORY_CARE_PROVIDER_SITE_OTHER): Payer: Self-pay | Admitting: Orthopedic Surgery

## 2016-07-29 DIAGNOSIS — E1142 Type 2 diabetes mellitus with diabetic polyneuropathy: Secondary | ICD-10-CM

## 2016-07-29 DIAGNOSIS — Z794 Long term (current) use of insulin: Secondary | ICD-10-CM | POA: Diagnosis not present

## 2016-07-29 DIAGNOSIS — I2510 Atherosclerotic heart disease of native coronary artery without angina pectoris: Secondary | ICD-10-CM

## 2016-07-29 DIAGNOSIS — Z89512 Acquired absence of left leg below knee: Secondary | ICD-10-CM | POA: Diagnosis not present

## 2016-07-29 DIAGNOSIS — I87321 Chronic venous hypertension (idiopathic) with inflammation of right lower extremity: Secondary | ICD-10-CM

## 2016-07-29 DIAGNOSIS — L97221 Non-pressure chronic ulcer of left calf limited to breakdown of skin: Secondary | ICD-10-CM

## 2016-07-29 NOTE — Progress Notes (Signed)
Office Visit Note   Patient: [name]           Date of Birth: [birth year]           MRN: [id number] Visit Date: 07/29/2016              Requested by: [name], MD [address] [place], [place] [address] PCP: [name], MD  Chief Complaint  Patient presents with  . Right Foot - Follow-up  . Left Leg - Follow-up    LT BKA      HPI: Patient presents in follow-up for ulcer left transtibial amputation. Patient also has venous stasis swelling in the right leg with traumatic ulcer anteriorly. Is wearing a regular sock on the right with direct skin contact, states does apply silvadene at night, a Vive shrinker on the left.    Assessment & Plan: Visit Diagnoses:  1. Idiopathic chronic venous hypertension of right lower extremity with inflammation   2. Status post below knee amputation of left lower extremity ([place])   3. Type 2 diabetes mellitus with diabetic polyneuropathy, with long-term current use of insulin ([place])   4. Non-pressure chronic ulcer of left calf, limited to breakdown of skin ([place])     Plan: Recommend that he continue with the vive stump shrinker daily with direct skin contact. Recommend wearing a 15-20 mm compression stocking on the right leg. Apply antibacterial ointment daily to traumatic ulcer with bandaid. Wear compression garment to RLE. Discussed can buy new compression garments at [employer] [employer] and [facility]. Stay out of prosthesis until healed.  Follow-Up Instructions: Return in about 3 weeks (around 08/19/2016).   Ortho Exam  Patient is alert, oriented, no adenopathy, well-dressed, normal affect, normal respiratory effort. Patient is ambulating in a wheelchair. Recommended he continue with his holding on physical therapy. Examination the ulcer on the residual limb looks a lot better. This is now just 5 mm x 1 mm. Does have new ulceration posterior to this, is 80mm x 3 mm. Granulation tissue in wound bed. No  drainage or surrounding erythema. The right lower extremity has trace edema. There is are 2 two cm in diameter traumatic ulceration to right anterior shin. Granulation tissue in wound bed. No drainage. No surrounding erythema or edema. No odor.   Imaging: No results found.  Labs: Lab Results  Component Value Date   HGBA1C 7.3 05/13/2016   HGBA1C 6.1 02/14/2016   HGBA1C 7.7 12/04/2015   ESRSEDRATE 35 (H) 03/31/2013   CRP 5.3 (H) 03/31/2013   REPTSTATUS 01/02/2013 FINAL 12/30/2012   GRAMSTAIN  12/30/2012    FEW WBC PRESENT, PREDOMINANTLY PMN RARE SQUAMOUS EPITHELIAL CELLS PRESENT ABUNDANT GRAM NEGATIVE RODS MODERATE GRAM POSITIVE COCCI IN PAIRS IN CLUSTERS   CULT  12/30/2012    MULTIPLE ORGANISMS PRESENT, NONE PREDOMINANT NO STAPHYLOCOCCUS AUREUS ISOLATED NO GROUP A STREP (S.PYOGENES) ISOLATED Performed at [facility] 12/15/2012   LABORGA PSEUDOMONAS AERUGINOSA 12/15/2012    Orders:  No orders of the defined types were placed in this encounter.  No orders of the defined types were placed in this encounter.    Procedures: No procedures performed  Clinical Data: No additional findings.  ROS:  All other systems negative, except as noted in the HPI. Review of Systems  Constitutional: Negative for chills and fever.  Skin: Positive for wound. Negative for color change.    Objective: Vital Signs: There were no vitals taken for this  visit.  Specialty Comments:  No specialty comments available.  PMFS History: Patient Active Problem List   Diagnosis Date Noted  . Non-pressure chronic ulcer of left calf, limited to breakdown of skin ([place]) 06/04/2016  . Idiopathic chronic venous hypertension of right lower extremity with inflammation 06/04/2016  . Status post below knee amputation of left lower extremity ([place]) 02/23/2016  . Type 2 diabetes mellitus with diabetic polyneuropathy, with long-term current use of insulin ([place]) 03/29/2015    . Advance directive discussed with patient 02/07/2014  . Normocytic anemia 04/01/2013  . Diabetic Charcot foot ([place]) 04/01/2013  . BMI 40.0-44.9, adult ([place]) 12/30/2012  . Routine general medical examination at a health care facility 12/25/2010  . HEMORRHOIDS-INTERNAL 11/22/2009  . PERSONAL HX COLONIC POLYPS 11/22/2009  . VENOUS INSUFFICIENCY 06/29/2008  . Osteoarthritis, multiple sites 04/03/2007  . ERECTILE DYSFUNCTION, ORGANIC 06/13/2006  . Hyperlipemia 06/11/2006  . GLAUCOMA 06/11/2006  . Coronary atherosclerosis of native coronary artery 06/11/2006  . Obstructive sleep apnea 06/11/2006   Past Medical History:  Diagnosis Date  . Coronary atherosclerosis of unspecified type of vessel, native or graft   . Diabetes mellitus without complication ([place])   . Hemorrhage of rectum and anus   . History of kidney stones   . Impotence of organic origin   . Internal hemorrhoids without mention of complication   . Malignant neoplasm of prostate ([place])   . Mononeuritis of unspecified site   . Osteoarthrosis, unspecified whether generalized or localized, unspecified site   . Other and unspecified hyperlipidemia   . Personal history of colonic polyps   . Personal history of diabetic foot ulcer    saw wound center, resolved 05/08/2010  . Personal history of gallstones   . Routine general medical examination at a health care facility   . Type II or unspecified type diabetes mellitus with neurological manifestations, not stated as uncontrolled(250.60)   . Unspecified glaucoma(365.9)   . Unspecified sleep apnea    cpcp  . Unspecified venous (peripheral) insufficiency     Family History  Problem Relation Age of Onset  . Lung cancer Father   . Multiple sclerosis Mother   . Heart attack Unknown        paternal aunts and uncles  . Peripheral vascular disease Maternal Grandfather        several amputations    Past Surgical History:  Procedure Laterality Date  . AMPUTATION Left 12/30/2012    Procedure: AMPUTATION RAY ;  Surgeon: Meredith Pel, MD;  Location: [facility];  Service: Orthopedics;  Laterality: Left;  LEFT GREAT TOE RAY AMPUTATION  . AMPUTATION Left 02/23/2016   Procedure: Left Below Knee Amputation;  Surgeon: [name], MD;  Location: [facility];  Service: Orthopedics;  Laterality: Left;  . APPENDECTOMY    . BASAL CELL CARCINOMA EXCISION  2/16   left forearm  . CARDIAC CATHETERIZATION  1998   Negative  . CATARACT EXTRACTION Right 2017   then lid surgery  . CORONARY ARTERY BYPASS GRAFT  09/2005   Post op AFIB  . EYE SURGERY    . FOOT BONE EXCISION Left 11/03/2013   DR DUDA   . I&D EXTREMITY Left 11/03/2013   Procedure: Left Foot Partial Bone Excision Cuboid and Medial Cuneiform, Wound Closures;  Surgeon: [name], MD;  Location: [facility];  Service: Orthopedics;  Laterality: Left;  . INSERTION PROSTATE RADIATION SEED  2009   RT and seeds for prostate cancer  . KIDNEY STONE SURGERY  04/1993  .  RCA stents  04/2003   EF 55%  . RETINAL DETACHMENT SURGERY  2002-2003  . thrombosed vein  1993   Right leg   Social History   Occupational History  . Heavy equipment mechanic--retired    Social History Main Topics  . Smoking status: Former Smoker    Packs/day: 2.00    Years: 35.00    Types: Cigars, Cigarettes    Quit date: 01/07/2001  . Smokeless tobacco: Never Used  . Alcohol use Yes     Comment: rare  . Drug use: No  . Sexual activity: Yes    Partners: Female

## 2016-07-30 DIAGNOSIS — H02052 Trichiasis without entropian right lower eyelid: Secondary | ICD-10-CM | POA: Diagnosis not present

## 2016-08-19 ENCOUNTER — Ambulatory Visit (INDEPENDENT_AMBULATORY_CARE_PROVIDER_SITE_OTHER): Payer: Medicare Other | Admitting: Family

## 2016-08-19 DIAGNOSIS — Z89512 Acquired absence of left leg below knee: Secondary | ICD-10-CM

## 2016-08-19 DIAGNOSIS — L97221 Non-pressure chronic ulcer of left calf limited to breakdown of skin: Secondary | ICD-10-CM

## 2016-08-19 DIAGNOSIS — I2510 Atherosclerotic heart disease of native coronary artery without angina pectoris: Secondary | ICD-10-CM

## 2016-08-19 DIAGNOSIS — I87321 Chronic venous hypertension (idiopathic) with inflammation of right lower extremity: Secondary | ICD-10-CM | POA: Diagnosis not present

## 2016-08-19 NOTE — Progress Notes (Signed)
Office Visit Note   Patient: [name]           Date of Birth: 1950-10-29           MRN: [id number] Visit Date: 08/19/2016              Requested by: [name], MD [place],  [address] PCP: [name], MD  No chief complaint on file.     HPI: Patient presents in follow-up for ulcer to left transtibial amputation. Patient also has venous stasis swelling in the right leg with traumatic ulcer anteriorly. Is wearing a compression sock on the right with direct skin contact, states does apply silvadene at night, a Vive shrinker on the left.    Assessment & Plan: Visit Diagnoses:  1. Idiopathic chronic venous hypertension of right lower extremity with inflammation   2. Non-pressure chronic ulcer of left calf, limited to breakdown of skin ([place])   3. Status post below knee amputation of left lower extremity ([place])     Plan: Recommend that he continue with the vive stump shrinker daily with direct skin contact. Recommend wearing a 15-20 mm compression stocking on the right leg. Apply antibacterial ointment daily to traumatic ulcer with bandaid. Wear compression garment to RLE.  Stay out of prosthesis until healed.  Follow-Up Instructions: Return in about 4 weeks (around 09/16/2016).   Ortho Exam  Patient is alert, oriented, no adenopathy, well-dressed, normal affect, normal respiratory effort.therapy. tient is ambulating in a wheelchair. Examination the residual limb has a new ulceration distally. This is 4 cm in diameter, filled in with granulation tissue. No depth. No drainage. No surrounding erythema, The right lower extremity has trace edema. There is 1 ulcer that is now 15 mm in diameter traumatic ulceration to right anterior shin. Granulation tissue in wound bed. No drainage. No surrounding erythema or edema. No odor.   Imaging: No results found.  Labs: Lab Results  Component Value Date   HGBA1C 7.3 05/13/2016   HGBA1C 6.1 02/14/2016     HGBA1C 7.7 12/04/2015   ESRSEDRATE 35 (H) 03/31/2013   CRP 5.3 (H) 03/31/2013   REPTSTATUS 01/02/2013 FINAL 12/30/2012   GRAMSTAIN  12/30/2012    FEW WBC PRESENT, PREDOMINANTLY PMN RARE SQUAMOUS EPITHELIAL CELLS PRESENT ABUNDANT GRAM NEGATIVE RODS MODERATE GRAM POSITIVE COCCI IN PAIRS IN CLUSTERS   CULT  12/30/2012    MULTIPLE ORGANISMS PRESENT, NONE PREDOMINANT NO STAPHYLOCOCCUS AUREUS ISOLATED NO GROUP A STREP (S.PYOGENES) ISOLATED Performed at [facility] 12/15/2012   LABORGA PSEUDOMONAS AERUGINOSA 12/15/2012    Orders:  No orders of the defined types were placed in this encounter.  No orders of the defined types were placed in this encounter.    Procedures: No procedures performed  Clinical Data: No additional findings.  ROS:  All other systems negative, except as noted in the HPI. Review of Systems  Constitutional: Negative for chills and fever.  Skin: Positive for wound. Negative for color change.    Objective: Vital Signs: There were no vitals taken for this visit.  Specialty Comments:  No specialty comments available.  PMFS History: Patient Active Problem List   Diagnosis Date Noted  . Non-pressure chronic ulcer of left calf, limited to breakdown of skin ([place]) 06/04/2016  . Idiopathic chronic venous hypertension of right lower extremity with inflammation 06/04/2016  . Status post below knee amputation of left lower extremity ([place]) 02/23/2016  . Type 2 diabetes mellitus  with diabetic polyneuropathy, with long-term current use of insulin ([place]) 03/29/2015  . Advance directive discussed with patient 02/07/2014  . Normocytic anemia 04/01/2013  . Diabetic Charcot foot ([place]) 04/01/2013  . BMI 40.0-44.9, adult ([place]) 12/30/2012  . Routine general medical examination at a health care facility 12/25/2010  . HEMORRHOIDS-INTERNAL 11/22/2009  . PERSONAL HX COLONIC POLYPS 11/22/2009  . VENOUS INSUFFICIENCY 06/29/2008  .  Osteoarthritis, multiple sites 04/03/2007  . ERECTILE DYSFUNCTION, ORGANIC 06/13/2006  . Hyperlipemia 06/11/2006  . GLAUCOMA 06/11/2006  . Coronary atherosclerosis of native coronary artery 06/11/2006  . Obstructive sleep apnea 06/11/2006   Past Medical History:  Diagnosis Date  . Coronary atherosclerosis of unspecified type of vessel, native or graft   . Diabetes mellitus without complication ([place])   . Hemorrhage of rectum and anus   . History of kidney stones   . Impotence of organic origin   . Internal hemorrhoids without mention of complication   . Malignant neoplasm of prostate ([place])   . Mononeuritis of unspecified site   . Osteoarthrosis, unspecified whether generalized or localized, unspecified site   . Other and unspecified hyperlipidemia   . Personal history of colonic polyps   . Personal history of diabetic foot ulcer    saw wound center, resolved 05/08/2010  . Personal history of gallstones   . Routine general medical examination at a health care facility   . Type II or unspecified type diabetes mellitus with neurological manifestations, not stated as uncontrolled(250.60)   . Unspecified glaucoma(365.9)   . Unspecified sleep apnea    cpcp  . Unspecified venous (peripheral) insufficiency     Family History  Problem Relation Age of Onset  . Lung cancer Father   . Multiple sclerosis Mother   . Heart attack Unknown        paternal aunts and uncles  . Peripheral vascular disease Maternal Grandfather        several amputations    Past Surgical History:  Procedure Laterality Date  . AMPUTATION Left 12/30/2012   Procedure: AMPUTATION RAY ;  Surgeon: Meredith Pel, MD;  [facility];  Service: Orthopedics;  Laterality: Left;  LEFT GREAT TOE RAY AMPUTATION  . AMPUTATION Left 02/23/2016   Procedure: Left Below Knee Amputation;  Surgeon: [name], MD;  Location: [facility];  Service: Orthopedics;  Laterality: Left;  . APPENDECTOMY    . BASAL CELL CARCINOMA EXCISION   2/16   left forearm  . CARDIAC CATHETERIZATION  1998   Negative  . CATARACT EXTRACTION Right 2017   then lid surgery  . CORONARY ARTERY BYPASS GRAFT  09/2005   Post op AFIB  . EYE SURGERY    . FOOT BONE EXCISION Left 11/03/2013   DR DUDA   . I&D EXTREMITY Left 11/03/2013   Procedure: Left Foot Partial Bone Excision Cuboid and Medial Cuneiform, Wound Closures;  Surgeon: [name], MD;  Location: [facility];  Service: Orthopedics;  Laterality: Left;  . INSERTION PROSTATE RADIATION SEED  2009   RT and seeds for prostate cancer  . KIDNEY STONE SURGERY  04/1993  . RCA stents  04/2003   EF 55%  . RETINAL DETACHMENT SURGERY  2002-2003  . thrombosed vein  1993   Right leg   Social History   Occupational History  . Heavy equipment mechanic--retired    Social History Main Topics  . Smoking status: Former Smoker    Packs/day: 2.00    Years: 35.00    Types: Cigars, Cigarettes  Quit date: 01/07/2001  . Smokeless tobacco: Never Used  . Alcohol use Yes     Comment: rare  . Drug use: No  . Sexual activity: Yes    Partners: Female

## 2016-08-27 ENCOUNTER — Ambulatory Visit: Payer: Medicare Other | Admitting: Internal Medicine

## 2016-09-16 ENCOUNTER — Other Ambulatory Visit: Payer: Self-pay | Admitting: Internal Medicine

## 2016-09-16 ENCOUNTER — Encounter (INDEPENDENT_AMBULATORY_CARE_PROVIDER_SITE_OTHER): Payer: Self-pay | Admitting: Orthopedic Surgery

## 2016-09-16 ENCOUNTER — Ambulatory Visit (INDEPENDENT_AMBULATORY_CARE_PROVIDER_SITE_OTHER): Payer: Medicare Other | Admitting: Orthopedic Surgery

## 2016-09-16 DIAGNOSIS — Z89512 Acquired absence of left leg below knee: Secondary | ICD-10-CM

## 2016-09-16 DIAGNOSIS — I2510 Atherosclerotic heart disease of native coronary artery without angina pectoris: Secondary | ICD-10-CM | POA: Diagnosis not present

## 2016-09-16 DIAGNOSIS — I87321 Chronic venous hypertension (idiopathic) with inflammation of right lower extremity: Secondary | ICD-10-CM

## 2016-09-16 DIAGNOSIS — L97221 Non-pressure chronic ulcer of left calf limited to breakdown of skin: Secondary | ICD-10-CM | POA: Diagnosis not present

## 2016-09-16 NOTE — Progress Notes (Signed)
Office Visit Note   Patient: [name]           Date of Birth: April 15, 1950           MRN: [id number] Visit Date: 09/16/2016              Requested by: [name], MD [address] [place], [place] [address] PCP: [name], MD  Chief Complaint  Patient presents with  . Left Leg - Follow-up      HPI: Patient is a 66 year old gentleman who is status post a left transtibial imitation. Patient was ambulating in his prosthesis when he developed a blister and ulcer. Patient feels like the wound is improving he is occasionally wearing the 3 X L compression stockings states that the 2 XL compression stocking is tight.   Assessment & Plan: Visit Diagnoses:  1. Idiopathic chronic venous hypertension of right lower extremity with inflammation   2. Non-pressure chronic ulcer of left calf, limited to breakdown of skin ([place])   3. History of left below knee amputation ([place])     Plan: Recommended that he continue with the compression stocking. Follow up in 4 weeks. They are to wash off the fibrinous exudative tissue.  Follow-Up Instructions: Return in about 4 weeks (around 10/14/2016).   Ortho Exam  Patient is alert, oriented, no adenopathy, well-dressed, normal affect, normal respiratory effort. Examination there is a thin layer of fibrinous exudative tissue over the superficial blisters left leg. There is one blistered area which has excellent granulation tissue which is 5 mm in diameter 0.1 mm deep the second wound is 15 mm in diameter 35 mm in length and 0.1 mm deep. The fibula is exudative tissue was removed there is excellent granulation tissue there is no redness no cellulitis no signs of infection.  Imaging: No results found. No images are attached to the encounter.  Labs: Lab Results  Component Value Date   HGBA1C 7.3 05/13/2016   HGBA1C 6.1 02/14/2016   HGBA1C 7.7 12/04/2015   ESRSEDRATE 35 (H) 03/31/2013   CRP 5.3 (H) 03/31/2013   REPTSTATUS  01/02/2013 FINAL 12/30/2012   GRAMSTAIN  12/30/2012    FEW WBC PRESENT, PREDOMINANTLY PMN RARE SQUAMOUS EPITHELIAL CELLS PRESENT ABUNDANT GRAM NEGATIVE RODS MODERATE GRAM POSITIVE COCCI IN PAIRS IN CLUSTERS   CULT  12/30/2012    MULTIPLE ORGANISMS PRESENT, NONE PREDOMINANT NO STAPHYLOCOCCUS AUREUS ISOLATED NO GROUP A STREP (S.PYOGENES) ISOLATED Performed at [facility] 12/15/2012   LABORGA PSEUDOMONAS AERUGINOSA 12/15/2012    Orders:  No orders of the defined types were placed in this encounter.  No orders of the defined types were placed in this encounter.    Procedures: No procedures performed  Clinical Data: No additional findings.  ROS:  All other systems negative, except as noted in the HPI. Review of Systems  Objective: Vital Signs: There were no vitals taken for this visit.  Specialty Comments:  No specialty comments available.  PMFS History: Patient Active Problem List   Diagnosis Date Noted  . History of left below knee amputation ([place]) 09/16/2016  . Non-pressure chronic ulcer of left calf, limited to breakdown of skin ([place]) 06/04/2016  . Idiopathic chronic venous hypertension of right lower extremity with inflammation 06/04/2016  . Status post below knee amputation of left lower extremity ([place]) 02/23/2016  . Type 2 diabetes mellitus with diabetic polyneuropathy, with long-term current use of insulin ([place]) 03/29/2015  . Advance directive discussed with  patient 02/07/2014  . Normocytic anemia 04/01/2013  . Diabetic Charcot foot ([place]) 04/01/2013  . BMI 40.0-44.9, adult ([place]) 12/30/2012  . Routine general medical examination at a health care facility 12/25/2010  . HEMORRHOIDS-INTERNAL 11/22/2009  . PERSONAL HX COLONIC POLYPS 11/22/2009  . VENOUS INSUFFICIENCY 06/29/2008  . Osteoarthritis, multiple sites 04/03/2007  . ERECTILE DYSFUNCTION, ORGANIC 06/13/2006  . Hyperlipemia 06/11/2006  . GLAUCOMA 06/11/2006  .  Coronary atherosclerosis of native coronary artery 06/11/2006  . Obstructive sleep apnea 06/11/2006   Past Medical History:  Diagnosis Date  . Coronary atherosclerosis of unspecified type of vessel, native or graft   . Diabetes mellitus without complication ([place])   . Hemorrhage of rectum and anus   . History of kidney stones   . Impotence of organic origin   . Internal hemorrhoids without mention of complication   . Malignant neoplasm of prostate ([place])   . Mononeuritis of unspecified site   . Osteoarthrosis, unspecified whether generalized or localized, unspecified site   . Other and unspecified hyperlipidemia   . Personal history of colonic polyps   . Personal history of diabetic foot ulcer    saw wound center, resolved 05/08/2010  . Personal history of gallstones   . Routine general medical examination at a health care facility   . Type II or unspecified type diabetes mellitus with neurological manifestations, not stated as uncontrolled(250.60)   . Unspecified glaucoma(365.9)   . Unspecified sleep apnea    cpcp  . Unspecified venous (peripheral) insufficiency     Family History  Problem Relation Age of Onset  . Lung cancer Father   . Multiple sclerosis Mother   . Heart attack Unknown        paternal aunts and uncles  . Peripheral vascular disease Maternal Grandfather        several amputations    Past Surgical History:  Procedure Laterality Date  . AMPUTATION Left 12/30/2012   Procedure: AMPUTATION RAY ;  Surgeon: Meredith Pel, MD;  [facility];  Service: Orthopedics;  Laterality: Left;  LEFT GREAT TOE RAY AMPUTATION  . AMPUTATION Left 02/23/2016   Procedure: Left Below Knee Amputation;  Surgeon: [name], MD;  Location: [facility];  Service: Orthopedics;  Laterality: Left;  . APPENDECTOMY    . BASAL CELL CARCINOMA EXCISION  2/16   left forearm  . CARDIAC CATHETERIZATION  1998   Negative  . CATARACT EXTRACTION Right 2017   then lid surgery  . CORONARY ARTERY  BYPASS GRAFT  09/2005   Post op AFIB  . EYE SURGERY    . FOOT BONE EXCISION Left 11/03/2013   DR Sydna Brodowski   . I&D EXTREMITY Left 11/03/2013   Procedure: Left Foot Partial Bone Excision Cuboid and Medial Cuneiform, Wound Closures;  Surgeon: [name], MD;  Location: [facility];  Service: Orthopedics;  Laterality: Left;  . INSERTION PROSTATE RADIATION SEED  2009   RT and seeds for prostate cancer  . KIDNEY STONE SURGERY  04/1993  . RCA stents  04/2003   EF 55%  . RETINAL DETACHMENT SURGERY  2002-2003  . thrombosed vein  1993   Right leg   Social History   Occupational History  . Heavy equipment mechanic--retired    Social History Main Topics  . Smoking status: Former Smoker    Packs/day: 2.00    Years: 35.00    Types: Cigars, Cigarettes    Quit date: 01/07/2001  . Smokeless tobacco: Never Used  . Alcohol use Yes  Comment: rare  . Drug use: No  . Sexual activity: Yes    Partners: Female

## 2016-10-08 DIAGNOSIS — H02055 Trichiasis without entropian left lower eyelid: Secondary | ICD-10-CM | POA: Diagnosis not present

## 2016-10-08 DIAGNOSIS — H02052 Trichiasis without entropian right lower eyelid: Secondary | ICD-10-CM | POA: Diagnosis not present

## 2016-10-11 ENCOUNTER — Ambulatory Visit (INDEPENDENT_AMBULATORY_CARE_PROVIDER_SITE_OTHER): Payer: Medicare Other | Admitting: Family

## 2016-10-11 ENCOUNTER — Encounter (INDEPENDENT_AMBULATORY_CARE_PROVIDER_SITE_OTHER): Payer: Self-pay | Admitting: Family

## 2016-10-11 DIAGNOSIS — E1142 Type 2 diabetes mellitus with diabetic polyneuropathy: Secondary | ICD-10-CM

## 2016-10-11 DIAGNOSIS — I2510 Atherosclerotic heart disease of native coronary artery without angina pectoris: Secondary | ICD-10-CM

## 2016-10-11 DIAGNOSIS — Z89512 Acquired absence of left leg below knee: Secondary | ICD-10-CM | POA: Diagnosis not present

## 2016-10-11 DIAGNOSIS — Z794 Long term (current) use of insulin: Secondary | ICD-10-CM

## 2016-10-11 DIAGNOSIS — L97221 Non-pressure chronic ulcer of left calf limited to breakdown of skin: Secondary | ICD-10-CM

## 2016-10-11 DIAGNOSIS — L97511 Non-pressure chronic ulcer of other part of right foot limited to breakdown of skin: Secondary | ICD-10-CM | POA: Diagnosis not present

## 2016-10-11 NOTE — Progress Notes (Signed)
Office Visit Note   Patient: [name]           Date of Birth: [birth year]           MRN: [id number] Visit Date: 10/11/2016              Requested by: [name], MD [address] [place], [place] [address] PCP: [name], MD  Chief Complaint  Patient presents with  . Right 5th Toe - Open Wound  . Left Leg - Open Wound    L BKA end bearing wound       HPI: Patient is a 66 year old gentleman who is status post a left transtibial amputation. Patient has had ulceration to residual limb for several weeks. Has not been in prosthesis. Has been wearing a Vive stump shrinker daily with direct skin contact. Does not complain of excessive drainage. Today presents for initial evaluation of a right 5th toe ulcer, wife has cut out his [place] over the toe to offload the area. States notices he walks on the "outside of his right foot" states has always done so, notices the foot rolls laterally out with ambulation, concerned for adding pressure to the lateral toe ulcer.   Assessment & Plan: Visit Diagnoses:  1. Status post below knee amputation of left lower extremity ([place])   2. Non-pressure chronic ulcer of left calf, limited to breakdown of skin ([place])   3. Type 2 diabetes mellitus with diabetic polyneuropathy, with long-term current use of insulin ([place])   4. Ulcer of toe of right foot, limited to breakdown of skin ([place])     Plan: Recommended that he continue with the compression stocking to left BKA. They are to wash off the fibrinous exudative tissue. Continue the offloading [place] to right foot. Begin silvadene dressing changes. Did provided felt padding to build up the lateral column in right [place].   Follow-Up Instructions: Return in about 2 weeks (around 10/25/2016).   Ortho Exam  Patient is alert, oriented, no adenopathy, well-dressed, normal affect, normal respiratory effort. Examination there is a thin layer of fibrinous exudative tissue over the superficial  blisters left leg. The wound is 12 mm in width x 50 mm in length and 0.1 mm deep. The exudative tissue was removed there is excellent granulation tissue there is no redness no cellulitis no signs of infection. Right foot 5th toe has a 7 mm in diameter ulceration this is 1 mm deep. 50% exudate, 50% granulation. No drainage. No surrounding erythema or sign of infection.  Imaging: No results found. No images are attached to the encounter.  Labs: Lab Results  Component Value Date   HGBA1C 7.3 05/13/2016   HGBA1C 6.1 02/14/2016   HGBA1C 7.7 12/04/2015   ESRSEDRATE 35 (H) 03/31/2013   CRP 5.3 (H) 03/31/2013   REPTSTATUS 01/02/2013 FINAL 12/30/2012   GRAMSTAIN  12/30/2012    FEW WBC PRESENT, PREDOMINANTLY PMN RARE SQUAMOUS EPITHELIAL CELLS PRESENT ABUNDANT GRAM NEGATIVE RODS MODERATE GRAM POSITIVE COCCI IN PAIRS IN CLUSTERS   CULT  12/30/2012    MULTIPLE ORGANISMS PRESENT, NONE PREDOMINANT NO STAPHYLOCOCCUS AUREUS ISOLATED NO GROUP A STREP (S.PYOGENES) ISOLATED Performed at [facility] 12/15/2012   LABORGA PSEUDOMONAS AERUGINOSA 12/15/2012    Orders:  No orders of the defined types were placed in this encounter.  No orders of the defined types were placed in this encounter.    Procedures: No procedures performed  Clinical Data: No additional findings.  ROS:  All other systems negative, except as noted in the HPI. Review of Systems  Constitutional: Negative for chills and fever.  Cardiovascular: Positive for leg swelling.  Skin: Positive for wound. Negative for color change.    Objective: Vital Signs: There were no vitals taken for this visit.  Specialty Comments:  No specialty comments available.  PMFS History: Patient Active Problem List   Diagnosis Date Noted  . Ulcer of toe of right foot, limited to breakdown of skin ([place]) 10/11/2016  . Non-pressure chronic ulcer of left calf, limited to breakdown of skin ([place]) 06/04/2016    . Idiopathic chronic venous hypertension of right lower extremity with inflammation 06/04/2016  . Status post below knee amputation of left lower extremity ([place]) 02/23/2016  . Type 2 diabetes mellitus with diabetic polyneuropathy, with long-term current use of insulin ([place]) 03/29/2015  . Advance directive discussed with patient 02/07/2014  . Normocytic anemia 04/01/2013  . Diabetic Charcot foot ([place]) 04/01/2013  . BMI 40.0-44.9, adult ([place]) 12/30/2012  . Routine general medical examination at a health care facility 12/25/2010  . HEMORRHOIDS-INTERNAL 11/22/2009  . PERSONAL HX COLONIC POLYPS 11/22/2009  . VENOUS INSUFFICIENCY 06/29/2008  . Osteoarthritis, multiple sites 04/03/2007  . ERECTILE DYSFUNCTION, ORGANIC 06/13/2006  . Hyperlipemia 06/11/2006  . GLAUCOMA 06/11/2006  . Coronary atherosclerosis of native coronary artery 06/11/2006  . Obstructive sleep apnea 06/11/2006   Past Medical History:  Diagnosis Date  . Coronary atherosclerosis of unspecified type of vessel, native or graft   . Diabetes mellitus without complication ([place])   . Hemorrhage of rectum and anus   . History of kidney stones   . Impotence of organic origin   . Internal hemorrhoids without mention of complication   . Malignant neoplasm of prostate ([place])   . Mononeuritis of unspecified site   . Osteoarthrosis, unspecified whether generalized or localized, unspecified site   . Other and unspecified hyperlipidemia   . Personal history of colonic polyps   . Personal history of diabetic foot ulcer    saw wound center, resolved 05/08/2010  . Personal history of gallstones   . Routine general medical examination at a health care facility   . Type II or unspecified type diabetes mellitus with neurological manifestations, not stated as uncontrolled(250.60)   . Unspecified glaucoma(365.9)   . Unspecified sleep apnea    cpcp  . Unspecified venous (peripheral) insufficiency     Family History  Problem Relation Age of  Onset  . Lung cancer Father   . Multiple sclerosis Mother   . Heart attack Unknown        paternal aunts and uncles  . Peripheral vascular disease Maternal Grandfather        several amputations    Past Surgical History:  Procedure Laterality Date  . AMPUTATION Left 12/30/2012   Procedure: AMPUTATION RAY ;  Surgeon: Meredith Pel, MD;  Location: [facility];  Service: Orthopedics;  Laterality: Left;  LEFT GREAT TOE RAY AMPUTATION  . AMPUTATION Left 02/23/2016   Procedure: Left Below Knee Amputation;  Surgeon: [name], MD;  Location: [facility];  Service: Orthopedics;  Laterality: Left;  . APPENDECTOMY    . BASAL CELL CARCINOMA EXCISION  2/16   left forearm  . CARDIAC CATHETERIZATION  1998   Negative  . CATARACT EXTRACTION Right 2017   then lid surgery  . CORONARY ARTERY BYPASS GRAFT  09/2005   Post op AFIB  . EYE SURGERY    . FOOT BONE EXCISION Left 11/03/2013  DR DUDA   . I&D EXTREMITY Left 11/03/2013   Procedure: Left Foot Partial Bone Excision Cuboid and Medial Cuneiform, Wound Closures;  Surgeon: [name], MD;  Location: [facility];  Service: Orthopedics;  Laterality: Left;  . INSERTION PROSTATE RADIATION SEED  2009   RT and seeds for prostate cancer  . KIDNEY STONE SURGERY  04/1993  . RCA stents  04/2003   EF 55%  . RETINAL DETACHMENT SURGERY  2002-2003  . thrombosed vein  1993   Right leg   Social History   Occupational History  . Heavy equipment mechanic--retired    Social History Main Topics  . Smoking status: Former Smoker    Packs/day: 2.00    Years: 35.00    Types: Cigars, Cigarettes    Quit date: 01/07/2001  . Smokeless tobacco: Never Used  . Alcohol use Yes     Comment: rare  . Drug use: No  . Sexual activity: Yes    Partners: Female

## 2016-10-17 ENCOUNTER — Ambulatory Visit (INDEPENDENT_AMBULATORY_CARE_PROVIDER_SITE_OTHER): Payer: Medicare Other | Admitting: Orthopedic Surgery

## 2016-10-17 ENCOUNTER — Encounter: Payer: Self-pay | Admitting: Physical Therapy

## 2016-10-17 NOTE — Therapy (Signed)
[facility] [address] [place], [place], [address] Phone: [phone]   Fax:  [phone]  Patient Details  Name: [name] MRN: [id number] Date of Birth: 1950-04-13 Referring Provider:  Dondra Prader, NP  Encounter Date: 10/17/2016  PHYSICAL THERAPY DISCHARGE SUMMARY  Visits from Start of Care: 5  Current functional level related to goals / functional outcomes: Patient developed wound on limb that required PT to stop. Patient did not return so unknown level of function at discharge.    Remaining deficits: Patient developed wound on limb that required PT to stop. Patient did not return so unknown level of function at discharge.      Education / Equipment: Prosthetic care Plan: Patient agrees to discharge.  Patient goals were not met. Patient is being discharged due to a change in medical status.  ?????        Allah Reason PT, DPT 10/17/2016, 10:45 AM  [facility] [address] [place], [place], [address] Phone: [phone]   Fax:  [phone]

## 2016-10-24 ENCOUNTER — Ambulatory Visit (INDEPENDENT_AMBULATORY_CARE_PROVIDER_SITE_OTHER): Payer: Medicare Other | Admitting: Orthopedic Surgery

## 2016-10-30 ENCOUNTER — Encounter (INDEPENDENT_AMBULATORY_CARE_PROVIDER_SITE_OTHER): Payer: Self-pay | Admitting: Orthopedic Surgery

## 2016-10-30 ENCOUNTER — Ambulatory Visit (INDEPENDENT_AMBULATORY_CARE_PROVIDER_SITE_OTHER): Payer: Medicare Other | Admitting: Orthopedic Surgery

## 2016-10-30 DIAGNOSIS — L97511 Non-pressure chronic ulcer of other part of right foot limited to breakdown of skin: Secondary | ICD-10-CM | POA: Diagnosis not present

## 2016-10-30 DIAGNOSIS — Z89512 Acquired absence of left leg below knee: Secondary | ICD-10-CM | POA: Diagnosis not present

## 2016-10-30 DIAGNOSIS — L97221 Non-pressure chronic ulcer of left calf limited to breakdown of skin: Secondary | ICD-10-CM

## 2016-10-30 DIAGNOSIS — I2510 Atherosclerotic heart disease of native coronary artery without angina pectoris: Secondary | ICD-10-CM

## 2016-10-30 NOTE — Progress Notes (Signed)
Office Visit Note   Patient: [name]           Date of Birth: May 12, 1950           MRN: [id number] Visit Date: 10/30/2016              Requested by: [name], MD [address] [place], [place] [address] PCP: [name], MD  Chief Complaint  Patient presents with  . Left Knee - Follow-up, Wound Check  . Right Foot - Follow-up, Wound Check      HPI: Patient is a 66 year old gentleman diabetic insensate neuropathy left transtibial amputation with a ulcer on the residual limb on the left with a ulcer on the right little toe.  Assessment & Plan: Visit Diagnoses:  1. Status post below knee amputation of left lower extremity ([place])   2. Non-pressure chronic ulcer of left calf, limited to breakdown of skin ([place])   3. Ulcer of toe of right foot, limited to breakdown of skin ([place])     Plan: Continue with the pressure loading with the [place] and Silvadene dressing changes to the right little toe daily.  Continue with the compression stocking for the left transtibial amputation.  Follow-Up Instructions: Return in about 3 weeks (around 11/20/2016).   Ortho Exam  Patient is alert, oriented, no adenopathy, well-dressed, normal affect, normal respiratory effort. Mentation patient's ulcer on the left transtibial amputation is healing quite well it is approximately 7 mm in width and 3 cm in length 0.1 mm deep with healthy granulation tissue there is no depth there is no hyper granulation tissue no cellulitis the leg is cool to the touch with no ischemic changes.  There is a right foot he has an ulcer over the lateral aspect of the PIP joint of the right little toe.  After informed consent a 15 blade knife was used to debride the skin and soft tissue back to bleeding viable granulation tissue.  The ulcer is 7 mm in diameter 1 mm deep.  This was touched with silver nitrate Band-Aid and Iodosorb was applied.  Imaging: No results found. No images are attached to the  encounter.  Labs: Lab Results  Component Value Date   HGBA1C 7.3 05/13/2016   HGBA1C 6.1 02/14/2016   HGBA1C 7.7 12/04/2015   ESRSEDRATE 35 (H) 03/31/2013   CRP 5.3 (H) 03/31/2013   REPTSTATUS 01/02/2013 FINAL 12/30/2012   GRAMSTAIN  12/30/2012    FEW WBC PRESENT, PREDOMINANTLY PMN RARE SQUAMOUS EPITHELIAL CELLS PRESENT ABUNDANT GRAM NEGATIVE RODS MODERATE GRAM POSITIVE COCCI IN PAIRS IN CLUSTERS   CULT  12/30/2012    MULTIPLE ORGANISMS PRESENT, NONE PREDOMINANT NO STAPHYLOCOCCUS AUREUS ISOLATED NO GROUP A STREP (S.PYOGENES) ISOLATED Performed at [facility] 12/15/2012   LABORGA PSEUDOMONAS AERUGINOSA 12/15/2012    Orders:  No orders of the defined types were placed in this encounter.  No orders of the defined types were placed in this encounter.    Procedures: No procedures performed  Clinical Data: No additional findings.  ROS:  All other systems negative, except as noted in the HPI. Review of Systems  Objective: Vital Signs: There were no vitals taken for this visit.  Specialty Comments:  No specialty comments available.  PMFS History: Patient Active Problem List   Diagnosis Date Noted  . Ulcer of toe of right foot, limited to breakdown of skin ([place]) 10/11/2016  . Non-pressure chronic ulcer of left calf, limited to breakdown  of skin ([place]) 06/04/2016  . Idiopathic chronic venous hypertension of right lower extremity with inflammation 06/04/2016  . Status post below knee amputation of left lower extremity ([place]) 02/23/2016  . Type 2 diabetes mellitus with diabetic polyneuropathy, with long-term current use of insulin ([place]) 03/29/2015  . Advance directive discussed with patient 02/07/2014  . Normocytic anemia 04/01/2013  . Diabetic Charcot foot ([place]) 04/01/2013  . BMI 40.0-44.9, adult ([place]) 12/30/2012  . Routine general medical examination at a health care facility 12/25/2010  . HEMORRHOIDS-INTERNAL 11/22/2009  .  PERSONAL HX COLONIC POLYPS 11/22/2009  . VENOUS INSUFFICIENCY 06/29/2008  . Osteoarthritis, multiple sites 04/03/2007  . ERECTILE DYSFUNCTION, ORGANIC 06/13/2006  . Hyperlipemia 06/11/2006  . GLAUCOMA 06/11/2006  . Coronary atherosclerosis of native coronary artery 06/11/2006  . Obstructive sleep apnea 06/11/2006   Past Medical History:  Diagnosis Date  . Coronary atherosclerosis of unspecified type of vessel, native or graft   . Diabetes mellitus without complication ([place])   . Hemorrhage of rectum and anus   . History of kidney stones   . Impotence of organic origin   . Internal hemorrhoids without mention of complication   . Malignant neoplasm of prostate ([place])   . Mononeuritis of unspecified site   . Osteoarthrosis, unspecified whether generalized or localized, unspecified site   . Other and unspecified hyperlipidemia   . Personal history of colonic polyps   . Personal history of diabetic foot ulcer    saw wound center, resolved 05/08/2010  . Personal history of gallstones   . Routine general medical examination at a health care facility   . Type II or unspecified type diabetes mellitus with neurological manifestations, not stated as uncontrolled(250.60)   . Unspecified glaucoma(365.9)   . Unspecified sleep apnea    cpcp  . Unspecified venous (peripheral) insufficiency     Family History  Problem Relation Age of Onset  . Lung cancer Father   . Multiple sclerosis Mother   . Heart attack Unknown        paternal aunts and uncles  . Peripheral vascular disease Maternal Grandfather        several amputations    Past Surgical History:  Procedure Laterality Date  . AMPUTATION Left 12/30/2012   Procedure: AMPUTATION RAY ;  Surgeon: Meredith Pel, MD;  Location: [facility];  Service: Orthopedics;  Laterality: Left;  LEFT GREAT TOE RAY AMPUTATION  . AMPUTATION Left 02/23/2016   Procedure: Left Below Knee Amputation;  Surgeon: [name], MD;  Location: [facility];  Service:  Orthopedics;  Laterality: Left;  . APPENDECTOMY    . BASAL CELL CARCINOMA EXCISION  2/16   left forearm  . CARDIAC CATHETERIZATION  1998   Negative  . CATARACT EXTRACTION Right 2017   then lid surgery  . CORONARY ARTERY BYPASS GRAFT  09/2005   Post op AFIB  . EYE SURGERY    . FOOT BONE EXCISION Left 11/03/2013   DR DUDA   . I&D EXTREMITY Left 11/03/2013   Procedure: Left Foot Partial Bone Excision Cuboid and Medial Cuneiform, Wound Closures;  Surgeon: [name], MD;  Location: [facility];  Service: Orthopedics;  Laterality: Left;  . INSERTION PROSTATE RADIATION SEED  2009   RT and seeds for prostate cancer  . KIDNEY STONE SURGERY  04/1993  . RCA stents  04/2003   EF 55%  . RETINAL DETACHMENT SURGERY  2002-2003  . thrombosed vein  1993   Right leg   Social History   Occupational  History  . Heavy equipment mechanic--retired    Social History Main Topics  . Smoking status: Former Smoker    Packs/day: 2.00    Years: 35.00    Types: Cigars, Cigarettes    Quit date: 01/07/2001  . Smokeless tobacco: Never Used  . Alcohol use Yes     Comment: rare  . Drug use: No  . Sexual activity: Yes    Partners: Female

## 2016-11-01 ENCOUNTER — Encounter (HOSPITAL_COMMUNITY): Payer: Self-pay | Admitting: *Deleted

## 2016-11-01 ENCOUNTER — Emergency Department (HOSPITAL_COMMUNITY)
Admission: EM | Admit: 2016-11-01 | Discharge: 2016-11-01 | Disposition: A | Discharge status: Home / Self Care | Payer: Medicare Other | Attending: Emergency Medicine | Admitting: Emergency Medicine

## 2016-11-01 DIAGNOSIS — Z794 Long term (current) use of insulin: Secondary | ICD-10-CM | POA: Insufficient documentation

## 2016-11-01 DIAGNOSIS — R1030 Lower abdominal pain, unspecified: Secondary | ICD-10-CM | POA: Diagnosis not present

## 2016-11-01 DIAGNOSIS — I2510 Atherosclerotic heart disease of native coronary artery without angina pectoris: Secondary | ICD-10-CM | POA: Insufficient documentation

## 2016-11-01 DIAGNOSIS — R339 Retention of urine, unspecified: Secondary | ICD-10-CM | POA: Diagnosis not present

## 2016-11-01 DIAGNOSIS — Z466 Encounter for fitting and adjustment of urinary device: Secondary | ICD-10-CM | POA: Diagnosis not present

## 2016-11-01 DIAGNOSIS — Z87891 Personal history of nicotine dependence: Secondary | ICD-10-CM | POA: Diagnosis not present

## 2016-11-01 DIAGNOSIS — R338 Other retention of urine: Secondary | ICD-10-CM | POA: Diagnosis not present

## 2016-11-01 DIAGNOSIS — E1142 Type 2 diabetes mellitus with diabetic polyneuropathy: Secondary | ICD-10-CM | POA: Insufficient documentation

## 2016-11-01 DIAGNOSIS — Z79899 Other long term (current) drug therapy: Secondary | ICD-10-CM | POA: Insufficient documentation

## 2016-11-01 LAB — COMPREHENSIVE METABOLIC PANEL
ALBUMIN: 4.4 g/dL (ref 3.5–5.0)
ALT: 17 U/L (ref 17–63)
AST: 21 U/L (ref 15–41)
Alkaline Phosphatase: 54 U/L (ref 38–126)
Anion gap: 13 (ref 5–15)
BILIRUBIN TOTAL: 1.1 mg/dL (ref 0.3–1.2)
BUN: 37 mg/dL — AB (ref 6–20)
CO2: 24 mmol/L (ref 22–32)
Calcium: 9.9 mg/dL (ref 8.9–10.3)
Chloride: 105 mmol/L (ref 101–111)
Creatinine, Ser: 1.57 mg/dL — ABNORMAL HIGH (ref 0.61–1.24)
GFR calc Af Amer: 51 mL/min — ABNORMAL LOW (ref >60)
GFR calc non Af Amer: 44 mL/min — ABNORMAL LOW (ref >60)
GLUCOSE: 119 mg/dL — AB (ref 65–99)
POTASSIUM: 4.3 mmol/L (ref 3.5–5.1)
SODIUM: 142 mmol/L (ref 135–145)
TOTAL PROTEIN: 8.3 g/dL — AB (ref 6.5–8.1)

## 2016-11-01 LAB — URINALYSIS, DIPSTICK ONLY
Bilirubin Urine: NEGATIVE
Glucose, UA: NEGATIVE mg/dL
Ketones, ur: 5 mg/dL — AB
Nitrite: NEGATIVE
PROTEIN: NEGATIVE mg/dL
Specific Gravity, Urine: 1.017 (ref 1.005–1.030)
pH: 5 (ref 5.0–8.0)

## 2016-11-01 LAB — CBC WITH DIFFERENTIAL/PLATELET
BASOS PCT: 1 %
Basophils Absolute: 0.1 10*3/uL (ref 0.0–0.1)
EOS ABS: 0.6 10*3/uL (ref 0.0–0.7)
Eosinophils Relative: 4 %
HCT: 45.7 % (ref 39.0–52.0)
HEMOGLOBIN: 15.9 g/dL (ref 13.0–17.0)
Lymphocytes Relative: 10 %
Lymphs Abs: 1.6 10*3/uL (ref 0.7–4.0)
MCH: 29.9 pg (ref 26.0–34.0)
MCHC: 34.8 g/dL (ref 30.0–36.0)
MCV: 86.1 fL (ref 78.0–100.0)
Monocytes Absolute: 0.7 10*3/uL (ref 0.1–1.0)
Monocytes Relative: 4 %
Neutro Abs: 14.3 10*3/uL — ABNORMAL HIGH (ref 1.7–7.7)
Neutrophils Relative %: 83 %
Platelets: 311 10*3/uL (ref 150–400)
RBC: 5.31 MIL/uL (ref 4.22–5.81)
RDW: 16.2 % — ABNORMAL HIGH (ref 11.5–15.5)
WBC: 17.4 10*3/uL — AB (ref 4.0–10.5)

## 2016-11-01 LAB — I-STAT CHEM 8, ED
BUN: 37 mg/dL — AB (ref 6–20)
CHLORIDE: 106 mmol/L (ref 101–111)
Calcium, Ion: 1.17 mmol/L (ref 1.15–1.40)
Creatinine, Ser: 1.6 mg/dL — ABNORMAL HIGH (ref 0.61–1.24)
Glucose, Bld: 119 mg/dL — ABNORMAL HIGH (ref 65–99)
HEMATOCRIT: 46 % (ref 39.0–52.0)
Hemoglobin: 15.6 g/dL (ref 13.0–17.0)
Potassium: 4.3 mmol/L (ref 3.5–5.1)
SODIUM: 140 mmol/L (ref 135–145)
TCO2: 26 mmol/L (ref 22–32)

## 2016-11-01 MED ORDER — HYDROMORPHONE HCL 1 MG/ML IJ SOLN
1.0000 mg | Freq: Once | INTRAMUSCULAR | Status: AC
  Administered 2016-11-01: 1 mg via INTRAVENOUS
  Filled 2016-11-01: qty 1

## 2016-11-01 MED ORDER — TAMSULOSIN HCL 0.4 MG PO CAPS: 0.4000 mg | ORAL_CAPSULE | Freq: Every day | ORAL | 0 refills | Status: AC

## 2016-11-01 MED ORDER — ONDANSETRON HCL 4 MG/2ML IJ SOLN
4.0000 mg | Freq: Once | INTRAMUSCULAR | Status: AC
  Administered 2016-11-01: 4 mg via INTRAVENOUS
  Filled 2016-11-01: qty 2

## 2016-11-01 NOTE — ED Notes (Signed)
Pt bladder scan is 991mL

## 2016-11-01 NOTE — ED Notes (Signed)
Urologist at bedside.

## 2016-11-01 NOTE — ED Provider Notes (Signed)
[facility] DEPT Provider Note   CSN: [id number] Arrival date & time: 11/01/16  1614     History   Chief Complaint Chief Complaint  Patient presents with  . Urinary Retention    HPI [name] is a 66 y.o. male.  Patient states that he cannot urinate.  Is been over 24 hours since he has gone   The history is provided by the patient. No language interpreter was used.  Abdominal Pain   This is a new problem. The current episode started yesterday. The problem occurs constantly. The problem has not changed since onset.The pain is associated with an unknown factor. The pain is located in the suprapubic region. The quality of the pain is aching. The pain is at a severity of 6/10. The pain is moderate. Pertinent negatives include anorexia, diarrhea, frequency, hematuria and headaches. Nothing aggravates the symptoms. Nothing relieves the symptoms.    Past Medical History:  Diagnosis Date  . Coronary atherosclerosis of unspecified type of vessel, native or graft   . Diabetes mellitus without complication ([place])   . Hemorrhage of rectum and anus   . History of kidney stones   . Impotence of organic origin   . Internal hemorrhoids without mention of complication   . Malignant neoplasm of prostate ([facility])   . Mononeuritis of unspecified site   . Osteoarthrosis, unspecified whether generalized or localized, unspecified site   . Other and unspecified hyperlipidemia   . Personal history of colonic polyps   . Personal history of diabetic foot ulcer    saw wound center, resolved 05/08/2010  . Personal history of gallstones   . Routine general medical examination at a health care facility   . Type II or unspecified type diabetes mellitus with neurological manifestations, not stated as uncontrolled(250.60)   . Unspecified glaucoma(365.9)   . Unspecified sleep apnea    cpcp  . Unspecified venous (peripheral) insufficiency     Patient Active Problem List   Diagnosis Date Noted  . Ulcer of toe of right foot, limited to breakdown of skin ([place]) 10/11/2016  . Non-pressure chronic ulcer of left calf, limited to breakdown of skin ([place]) 06/04/2016  . Idiopathic chronic venous hypertension of right lower extremity with inflammation 06/04/2016  . Status post below knee amputation of left lower extremity ([place]) 02/23/2016  . Type 2 diabetes mellitus with diabetic polyneuropathy, with long-term current use of insulin ([place]) 03/29/2015  . Advance directive discussed with patient 02/07/2014  . Normocytic anemia 04/01/2013  . Diabetic Charcot foot ([place]) 04/01/2013  . BMI 40.0-44.9, adult ([place]) 12/30/2012  . Routine general medical examination at a health care facility 12/25/2010  . HEMORRHOIDS-INTERNAL 11/22/2009  . PERSONAL HX COLONIC POLYPS 11/22/2009  . VENOUS INSUFFICIENCY 06/29/2008  . Osteoarthritis, multiple sites 04/03/2007  . ERECTILE DYSFUNCTION, ORGANIC 06/13/2006  . Hyperlipemia 06/11/2006  . GLAUCOMA 06/11/2006  . Coronary atherosclerosis of native coronary artery 06/11/2006  . Obstructive sleep apnea 06/11/2006    Past Surgical History:  Procedure Laterality Date  . AMPUTATION Left 12/30/2012   Procedure: AMPUTATION RAY ;  Surgeon: Meredith Pel, MD;  Location: [facility];  Service: Orthopedics;  Laterality: Left;  LEFT GREAT TOE RAY AMPUTATION  . AMPUTATION Left 02/23/2016   Procedure: Left Below Knee Amputation;  Surgeon: [name], MD;  Location: [facility];  Service: Orthopedics;  Laterality: Left;  . APPENDECTOMY    . BASAL CELL CARCINOMA EXCISION  2/16   left forearm  . CARDIAC CATHETERIZATION  1998   Negative  . CATARACT EXTRACTION Right 2017   then lid surgery  . CORONARY ARTERY BYPASS GRAFT  09/2005   Post op AFIB  . EYE SURGERY    . FOOT BONE EXCISION Left 11/03/2013   DR DUDA   . I&D EXTREMITY Left 11/03/2013   Procedure: Left Foot Partial Bone Excision Cuboid and Medial Cuneiform, Wound Closures;  Surgeon: Nadara Mustard, MD;  Location: [facility];  Service: Orthopedics;  Laterality: Left;  . INSERTION PROSTATE RADIATION SEED  2009   RT and seeds for prostate cancer  . KIDNEY STONE SURGERY  04/1993  . RCA stents  04/2003   EF 55%  . RETINAL DETACHMENT SURGERY  2002-2003  . thrombosed vein  1993   Right leg       Home Medications    Prior to Admission medications   Medication Sig Start Date End Date Taking? Authorizing Provider  aspirin 325 MG tablet Take 325 mg by mouth daily.     Yes [provider]  carvedilol (COREG) 25 MG tablet TAKE 1 TABLET TWICE DAILY Patient taking differently: TAKE 1 TABLET PO TWICE DAILY 02/20/16  Yes Karie Schwalbe, MD  losartan-hydrochlorothiazide (HYZAAR) 50-12.5 MG tablet TAKE 1 TABLET EVERY DAY 02/20/16  Yes Karie Schwalbe, MD  metFORMIN (GLUCOPHAGE) 1000 MG tablet TAKE 1 TABLET TWICE DAILY Patient taking differently: TAKE 1 TABLET PO QHS 07/09/16  Yes Carlus Pavlov, MD  naproxen sodium (ANAPROX) 220 MG tablet Take 440 mg by mouth 2 (two) times daily as needed (for pain.). For pain   Yes [provider]  NOVOLOG FLEXPEN 100 UNIT/ML FlexPen INJECT  30  TO  35 UNITS SUBCUTANEOUSLY THREE TIMES DAILY WITH MEALS Patient taking differently: INJECT  24  TO  30 UNITS SUBCUTANEOUSLY THREE TIMES DAILY WITH MEALS 04/10/16  Yes Carlus Pavlov, MD  potassium citrate (UROCIT-K) 10 MEQ (1080 MG) SR tablet Take 10 mEq by mouth 2 (two) times daily.  03/30/14  Yes [provider]  Probiotic Product (PROBIOTIC DAILY PO) Take 1 capsule by mouth daily.   Yes [provider]  TOUJEO SOLOSTAR 300 UNIT/ML SOPN INJECT 60 UNITS INTO THE SKIN 2 (TWO) TIMES DAILY. Patient taking differently: INJECT 60 UNITS INTO THE SKIN QHS 06/25/16  Yes Carlus Pavlov, MD  ACCU-CHEK FASTCLIX LANCETS MISC Use to check sugar 3 times daily dx- E11.42 07/03/16   Carlus Pavlov, MD  B-D UF III MINI PEN NEEDLES 31G X 5 MM MISC USE TO INJECT INSULIN FOUR TIMES DAILY AS  INSTRUCTED (FOR DIABETES) 09/16/16   Carlus Pavlov, MD  Blood Glucose Monitoring Suppl (ACCU-CHEK GUIDE) w/Device KIT 1 kit by Other route 3 (three) times daily. Use to check blood sugars 3 times daily, Dx Code E11.42 07/09/16   Carlus Pavlov, MD  glucose blood (ACCU-CHEK GUIDE) test strip Use as instructed to check sugar 3 times daily dx- E11.42 07/03/16   Carlus Pavlov, MD  mupirocin ointment (BACTROBAN) 2 % Apply to wound daily Patient taking differently: Apply 1 application topically daily as needed (wound healing). Apply to wound daily 05/31/16   Adonis Huguenin, NP  oxyCODONE-acetaminophen (ROXICET) 5-325 MG tablet Take 1 tablet by mouth every 4 (four) hours as needed for severe pain. Patient not taking: Reported on 11/01/2016 02/26/16   Nadara Mustard, MD  tamsulosin (FLOMAX) 0.4 MG CAPS capsule Take 1 capsule (0.4 mg total) by mouth daily. 11/01/16 12/01/16  Bethann Berkshire, MD  triamcinolone (KENALOG) 0.025 % cream APPLY 1  APPLICATION TOPICALLY 3 (THREE) TIMES DAILY AS NEEDED. Patient taking differently: APPLY 1 APPLICATION TOPICALLY 3 (THREE) TIMES DAILY AS NEEDED FOR DRY SKIN AND RASH 02/22/14   [name], MD    Family History Family History  Problem Relation Age of Onset  . Lung cancer Father   . Multiple sclerosis Mother   . Heart attack Unknown        paternal aunts and uncles  . Peripheral vascular disease Maternal Grandfather        several amputations    Social History Social History  Substance Use Topics  . Smoking status: Former Smoker    Packs/day: 2.00    Years: 35.00    Types: Cigars, Cigarettes    Quit date: 01/07/2001  . Smokeless tobacco: Never Used  . Alcohol use Yes     Comment: rare     Allergies   Atorvastatin; Ezetimibe; and Simvastatin   Review of Systems Review of Systems  Constitutional: Negative for appetite change and fatigue.  HENT: Negative for congestion, ear discharge and sinus pressure.   Eyes: Negative for discharge.    Respiratory: Negative for cough.   Cardiovascular: Negative for chest pain.  Gastrointestinal: Positive for abdominal pain. Negative for anorexia and diarrhea.  Genitourinary: Negative for frequency and hematuria.  Musculoskeletal: Negative for back pain.  Skin: Negative for rash.  Neurological: Negative for seizures and headaches.  Psychiatric/Behavioral: Negative for hallucinations.     Physical Exam Updated Vital Signs BP (!) 155/91 (BP Location: Left Arm)   Pulse 98   Temp 98 F (36.7 C) (Oral)   Resp 18   SpO2 93%   Physical Exam  Constitutional: He is oriented to person, place, and time. He appears well-developed.  HENT:  Head: Normocephalic.  Eyes: Conjunctivae and EOM are normal. No scleral icterus.  Neck: Neck supple. No thyromegaly present.  Cardiovascular: Normal rate and regular rhythm.  Exam reveals no gallop and no friction rub.   No murmur heard. Pulmonary/Chest: No stridor. He has no wheezes. He has no rales. He exhibits no tenderness.  Abdominal: He exhibits distension. There is tenderness. There is no rebound.  Musculoskeletal: Normal range of motion. He exhibits no edema.  Lymphadenopathy:    He has no cervical adenopathy.  Neurological: He is oriented to person, place, and time. He exhibits normal muscle tone. Coordination normal.  Skin: No rash noted. No erythema.  Psychiatric: He has a normal mood and affect. His behavior is normal.     ED Treatments / Results  Labs (all labs ordered are listed, but only abnormal results are displayed) Labs Reviewed  CBC WITH DIFFERENTIAL/PLATELET - Abnormal; Notable for the following:       Result Value   WBC 17.4 (*)    RDW 16.2 (*)    Neutro Abs 14.3 (*)    All other components within normal limits  COMPREHENSIVE METABOLIC PANEL - Abnormal; Notable for the following:    Glucose, Bld 119 (*)    BUN 37 (*)    Creatinine, Ser 1.57 (*)    Total Protein 8.3 (*)    GFR calc non Af Amer 44 (*)    GFR calc Af  Amer 51 (*)    All other components within normal limits  URINALYSIS, DIPSTICK ONLY - Abnormal; Notable for the following:    Hgb urine dipstick MODERATE (*)    Ketones, ur 5 (*)    Leukocytes, UA SMALL (*)    All other components within normal limits  I-STAT CHEM 8, ED - Abnormal; Notable for the following:    BUN 37 (*)    Creatinine, Ser 1.60 (*)    Glucose, Bld 119 (*)    All other components within normal limits    EKG  EKG Interpretation None       Radiology No results found.  Procedures Procedures (including critical care time)  Medications Ordered in ED Medications  HYDROmorphone (DILAUDID) injection 1 mg (1 mg Intravenous Given 11/01/16 1754)  ondansetron (ZOFRAN) injection 4 mg (4 mg Intravenous Given 11/01/16 1754)     Initial Impression / Assessment and Plan / ED Course  I have reviewed the triage vital signs and the nursing notes.  Pertinent labs & imaging results that were available during my care of the patient were reviewed by me and considered in my medical decision making (see chart for details).    Patient with urinary retention.  Urology needed to come in and put a Foley catheter in.  Patient will be placed on Flomax and follow-up with urology   Final Clinical Impressions(s) / ED Diagnoses   Final diagnoses:  Urinary retention    New Prescriptions New Prescriptions   TAMSULOSIN (FLOMAX) 0.4 MG CAPS CAPSULE    Take 1 capsule (0.4 mg total) by mouth daily.     Milton Ferguson, MD 11/01/16 2112

## 2016-11-01 NOTE — Consult Note (Signed)
Urology Consult   Physician requesting consult: Dr. Roderic Palau Reason for consult: Urinary retention  History of Present Illness: [name] is a 66 y.o. with a history of prostate cancer treated with brachytherapy several years ago.  He also has an extensive history of nephrolithiasis and passes stones on a regular basis.  He presents to the [facility] ED 2/2 progressively worsening suprapubic pain and the inability to void  for the past 48 hours.  Bladder scan performed in the ED showed ~1000 mL within his bladder.  He denies a prior history of urinary retention or urethral stricture disease and is not currently on medications to assist voiding.  The ED team attempted to place a Foley catheter, but were unsuccessful.  Urology was consulted to assess the patient and for catheter placement.  Past Medical History:  Diagnosis Date  . Coronary atherosclerosis of unspecified type of vessel, native or graft   . Diabetes mellitus without complication ([place])   . Hemorrhage of rectum and anus   . History of kidney stones   . Impotence of organic origin   . Internal hemorrhoids without mention of complication   . Malignant neoplasm of prostate ([place])   . Mononeuritis of unspecified site   . Osteoarthrosis, unspecified whether generalized or localized, unspecified site   . Other and unspecified hyperlipidemia   . Personal history of colonic polyps   . Personal history of diabetic foot ulcer    saw wound center, resolved 05/08/2010  . Personal history of gallstones   . Routine general medical examination at a health care facility   . Type II or unspecified type diabetes mellitus with neurological manifestations, not stated as uncontrolled(250.60)   . Unspecified glaucoma(365.9)   . Unspecified sleep apnea    cpcp  . Unspecified venous (peripheral) insufficiency     Past Surgical History:  Procedure Laterality Date  . AMPUTATION Left 12/30/2012   Procedure: AMPUTATION RAY ;  Surgeon: Meredith Pel, MD;   Location: [facility];  Service: Orthopedics;  Laterality: Left;  LEFT GREAT TOE RAY AMPUTATION  . AMPUTATION Left 02/23/2016   Procedure: Left Below Knee Amputation;  Surgeon: [name], MD;  Location: [facility];  Service: Orthopedics;  Laterality: Left;  . APPENDECTOMY    . BASAL CELL CARCINOMA EXCISION  2/16   left forearm  . CARDIAC CATHETERIZATION  1998   Negative  . CATARACT EXTRACTION Right 2017   then lid surgery  . CORONARY ARTERY BYPASS GRAFT  09/2005   Post op AFIB  . EYE SURGERY    . FOOT BONE EXCISION Left 11/03/2013   DR DUDA   . I&D EXTREMITY Left 11/03/2013   Procedure: Left Foot Partial Bone Excision Cuboid and Medial Cuneiform, Wound Closures;  Surgeon: [name], MD;  Location: [facility];  Service: Orthopedics;  Laterality: Left;  . INSERTION PROSTATE RADIATION SEED  2009   RT and seeds for prostate cancer  . KIDNEY STONE SURGERY  04/1993  . RCA stents  04/2003   EF 55%  . RETINAL DETACHMENT SURGERY  2002-2003  . thrombosed vein  1993   Right leg    Current Hospital Medications:  Home Meds:  Current Meds  Medication Sig  . aspirin 325 MG tablet Take 325 mg by mouth daily.    . carvedilol (COREG) 25 MG tablet TAKE 1 TABLET TWICE DAILY (Patient taking differently: TAKE 1 TABLET PO TWICE DAILY)  . losartan-hydrochlorothiazide (HYZAAR) 50-12.5 MG tablet TAKE 1 TABLET EVERY DAY  .  metFORMIN (GLUCOPHAGE) 1000 MG tablet TAKE 1 TABLET TWICE DAILY (Patient taking differently: TAKE 1 TABLET PO QHS)  . naproxen sodium (ANAPROX) 220 MG tablet Take 440 mg by mouth 2 (two) times daily as needed (for pain.). For pain  . NOVOLOG FLEXPEN 100 UNIT/ML FlexPen INJECT  30  TO  35 UNITS SUBCUTANEOUSLY THREE TIMES DAILY WITH MEALS (Patient taking differently: INJECT  24  TO  30 UNITS SUBCUTANEOUSLY THREE TIMES DAILY WITH MEALS)  . potassium citrate (UROCIT-K) 10 MEQ (1080 MG) SR tablet Take 10 mEq by mouth 2 (two) times daily.   . Probiotic Product (PROBIOTIC DAILY PO) Take 1 capsule by  mouth daily.  . TOUJEO SOLOSTAR 300 UNIT/ML SOPN INJECT 60 UNITS INTO THE SKIN 2 (TWO) TIMES DAILY. (Patient taking differently: INJECT 60 UNITS INTO THE SKIN QHS)    Scheduled Meds: Continuous Infusions: PRN Meds:.  Allergies:  Allergies  Allergen Reactions  . Atorvastatin Other (See Comments)    REACTION: myalgias  . Ezetimibe Other (See Comments)    Body ache  . Simvastatin Other (See Comments)    Body ache    Family History  Problem Relation Age of Onset  . Lung cancer Father   . Multiple sclerosis Mother   . Heart attack Unknown        paternal aunts and uncles  . Peripheral vascular disease Maternal Grandfather        several amputations    Social History:  reports that he quit smoking about 15 years ago. His smoking use included Cigars and Cigarettes. He has a 70.00 pack-year smoking history. He has never used smokeless tobacco. He reports that he drinks alcohol. He reports that he does not use drugs.  ROS: A complete review of systems was performed.  All systems are negative except for pertinent findings as noted.  Physical Exam:  Vital signs in last 24 hours: Temp:  [98 F (36.7 C)] 98 F (36.7 C) (10/26 1654) Pulse Rate:  [98] 98 (10/26 1654) Resp:  [18] 18 (10/26 1654) BP: (155)/(91) 155/91 (10/26 1654) SpO2:  [93 %] 93 % (10/26 1654) Constitutional:  Alert and oriented, No acute distress Cardiovascular: Regular rate and rhythm, No JVD Respiratory: Normal respiratory effort, Lungs clear bilaterally GI: Abdomen is soft, nontender, nondistended, no abdominal masses GU: No CVA tenderness.  The penis is uncircumcised with no blood at the meatus.  No evidence of meatal stenosis.  Both testicle are descended and demonstrate no masses, nodules or tenderness to palpation  Lymphatic: No lymphadenopathy Neurologic: Grossly intact, no focal deficits Psychiatric: Normal mood and affect Ext: s/p left BKA  Laboratory Data:   Recent Labs  11/01/16 1756  11/01/16 1802  WBC 17.4*  --   HGB 15.9 15.6  HCT 45.7 46.0  PLT 311  --      Recent Labs  11/01/16 1756 11/01/16 1802  NA 142 140  K 4.3 4.3  CL 105 106  GLUCOSE 119* 119*  BUN 37* 37*  CALCIUM 9.9  --   CREATININE 1.57* 1.60*     Results for orders placed or performed during the hospital encounter of 11/01/16 (from the past 24 hour(s))  CBC with Differential/Platelet     Status: Abnormal   Collection Time: 11/01/16  5:56 PM  Result Value Ref Range   WBC 17.4 (H) 4.0 - 10.5 K/uL   RBC 5.31 4.22 - 5.81 MIL/uL   Hemoglobin 15.9 13.0 - 17.0 g/dL   HCT 45.7 39.0 - 52.0 %  MCV 86.1 78.0 - 100.0 fL   MCH 29.9 26.0 - 34.0 pg   MCHC 34.8 30.0 - 36.0 g/dL   RDW 16.2 (H) 11.5 - 15.5 %   Platelets 311 150 - 400 K/uL   Neutrophils Relative % 83 %   Neutro Abs 14.3 (H) 1.7 - 7.7 K/uL   Lymphocytes Relative 10 %   Lymphs Abs 1.6 0.7 - 4.0 K/uL   Monocytes Relative 4 %   Monocytes Absolute 0.7 0.1 - 1.0 K/uL   Eosinophils Relative 4 %   Eosinophils Absolute 0.6 0.0 - 0.7 K/uL   Basophils Relative 1 %   Basophils Absolute 0.1 0.0 - 0.1 K/uL  Comprehensive metabolic panel     Status: Abnormal   Collection Time: 11/01/16  5:56 PM  Result Value Ref Range   Sodium 142 135 - 145 mmol/L   Potassium 4.3 3.5 - 5.1 mmol/L   Chloride 105 101 - 111 mmol/L   CO2 24 22 - 32 mmol/L   Glucose, Bld 119 (H) 65 - 99 mg/dL   BUN 37 (H) 6 - 20 mg/dL   Creatinine, Ser 1.57 (H) 0.61 - 1.24 mg/dL   Calcium 9.9 8.9 - 10.3 mg/dL   Total Protein 8.3 (H) 6.5 - 8.1 g/dL   Albumin 4.4 3.5 - 5.0 g/dL   AST 21 15 - 41 U/L   ALT 17 17 - 63 U/L   Alkaline Phosphatase 54 38 - 126 U/L   Total Bilirubin 1.1 0.3 - 1.2 mg/dL   GFR calc non Af Amer 44 (L) >60 mL/min   GFR calc Af Amer 51 (L) >60 mL/min   Anion gap 13 5 - 15  I-stat chem 8, ed     Status: Abnormal   Collection Time: 11/01/16  6:02 PM  Result Value Ref Range   Sodium 140 135 - 145 mmol/L   Potassium 4.3 3.5 - 5.1 mmol/L   Chloride  106 101 - 111 mmol/L   BUN 37 (H) 6 - 20 mg/dL   Creatinine, Ser 1.60 (H) 0.61 - 1.24 mg/dL   Glucose, Bld 119 (H) 65 - 99 mg/dL   Calcium, Ion 1.17 1.15 - 1.40 mmol/L   TCO2 26 22 - 32 mmol/L   Hemoglobin 15.6 13.0 - 17.0 g/dL   HCT 46.0 39.0 - 52.0 %   No results found for this or any previous visit (from the past 240 hour(s)).  Renal Function:  Recent Labs  11/01/16 1756 11/01/16 1802  CREATININE 1.57* 1.60*   CrCl cannot be calculated (Unknown ideal weight.).  Radiologic Imaging: No results found.  I independently reviewed the above imaging studies.  Impression/Recommendation Acute urinary retention History of prostate cancer, s/p brachytherapy History of nephrolithiasis Type II DM Peripheral vascular disease  -An 54 French Coude catheter was placed with minimal resistance and with return of 1200 mL of clear-yellow urine.  Recommend keeping the catheter in place, starting the patient on once daily tamsulosin and follow-up in office next week for a voiding trial.  Conception Oms Matt Delpizzo, MD 11/01/2016, 7:52 PM

## 2016-11-01 NOTE — Procedures (Signed)
Procedure: Foley catheter placement Pre-procedure dx: Urinary retention Post-procedure dx: Same  Description of procedure:  The patient's genitals were prepped and draped in the usual fashion.  An 60 French Coude catheter was placed with minimal resistance and with return of 1200 mL of clear-yellow urine.  The catheter balloon was inflated with 10 mL of sterile water and placed to gravity drainage.  The patient tolerated the procedure without any complications or discomfort.  Plan: Keep Foley in place, start tamsulosin once daily and follow-up with [facility] next week for a voiding trial.  Ellison Hughs, MD

## 2016-11-01 NOTE — Discharge Instructions (Signed)
Follow-up with your urologist next week °

## 2016-11-01 NOTE — ED Triage Notes (Signed)
Pt states he has been unable to urinate for the past 2-3 days. Pt states he has a kidney stone that may be obstructing his urine. Pt states he was told to come to ED for catheter insertion.

## 2016-11-03 LAB — URINE CULTURE: CULTURE: NO GROWTH

## 2016-11-04 ENCOUNTER — Other Ambulatory Visit: Payer: Self-pay | Admitting: Internal Medicine

## 2016-11-05 ENCOUNTER — Ambulatory Visit (INDEPENDENT_AMBULATORY_CARE_PROVIDER_SITE_OTHER): Payer: Medicare Other | Admitting: Internal Medicine

## 2016-11-05 ENCOUNTER — Encounter: Payer: Self-pay | Admitting: Internal Medicine

## 2016-11-05 VITALS — BP 138/88 | HR 85 | Wt 273.0 lb

## 2016-11-05 DIAGNOSIS — E1142 Type 2 diabetes mellitus with diabetic polyneuropathy: Secondary | ICD-10-CM | POA: Diagnosis not present

## 2016-11-05 DIAGNOSIS — Z794 Long term (current) use of insulin: Secondary | ICD-10-CM | POA: Diagnosis not present

## 2016-11-05 DIAGNOSIS — I2510 Atherosclerotic heart disease of native coronary artery without angina pectoris: Secondary | ICD-10-CM | POA: Diagnosis not present

## 2016-11-05 DIAGNOSIS — Z23 Encounter for immunization: Secondary | ICD-10-CM | POA: Diagnosis not present

## 2016-11-05 DIAGNOSIS — E669 Obesity, unspecified: Secondary | ICD-10-CM

## 2016-11-05 LAB — POCT GLYCOSYLATED HEMOGLOBIN (HGB A1C): Hemoglobin A1C: 5.7

## 2016-11-05 NOTE — Patient Instructions (Addendum)
Please continue: - Metformin 1000 mg with dinner - Toujeo 60 units at bedtime - Humalog 20-24 units.   Please return in 4  months with your sugar log.

## 2016-11-05 NOTE — Addendum Note (Signed)
Addended by: Caprice Beaver T on: 11/05/2016 10:39 AM   Modules accepted: Orders

## 2016-11-05 NOTE — Progress Notes (Signed)
Patient ID: [name], male   DOB: 1950/10/07, 66 y.o.   MRN: [id number]  HPI: [name] is a 66 y.o.-year-old male, returning for f/u for DM2, dx 2008, insulin-dependent since dx, uncontrolled, with complications (peripheral neuropathy, CAD-status post CABG in 2008, PVD, Charcot foot, history of diabetic foot ulcer, history of retinal detachment in 2002-3). Last visit 6 mo ago. 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   He just had a kidney stone >> developed obstructive sxs after he passed it >> now has a catheter. Sugars higher in 10/2016.  He started back on his previous diet after our last visit (1200 cal/day). He lost 20 pounds since last visit and his sugars improved dramatically.  Last hemoglobin A1c was: Lab Results  Component Value Date   HGBA1C 7.3 05/13/2016   HGBA1C 6.1 02/14/2016   HGBA1C 7.7 12/04/2015   Pt is on a regimen of: - Metformin 1000 mg with dinner - Toujeo 60 units at bedtime - Humalog 20-24 units.  Pt checks his sugars 1-3x a day: - am:  62-135, 176 >> 122-196, 304 >> 101-145, 153 - before lunch: 69, 104-154, 207 >> n/c >> 56,  - before dinner: 59, 77-135, 165 >> 126, 141-184, 211 >> 62-132 - bedtime: 194-310 >> 117, 175-236 >> n/c >> 68-109, 136, 254, 293 Lowest: 131 >> 59 x1 >> 122 >> 56 x1 (lunchtime). Highest sugar:240 >> 207 >> 304 >> 293 (pain).  Pt's meals are now: - Breakfast: bacon, eggs, tomato sandwich (sometimes no bread) >> coffee + fruit - Lunch: PB crackers >> baked fish or chicken + veggies - Dinner: meat + 2 veggies  >> same as lunch - Snacks: 1 a day - 2x a week, icecream  >> fruit Uses Splenda in tea and coffee.  - He has CKD, last BUN/creatinine:  Lab Results  Component Value Date   BUN 37 (H) 11/01/2016   CREATININE 1.60 (H) 11/01/2016  On Losartan - last set of lipids: Lab Results  Component Value Date   CHOL 125 02/14/2016   HDL 26.80 (L) 02/14/2016   LDLCALC 71 02/14/2016    LDLDIRECT 72.0 02/13/2015   TRIG 135.0 02/14/2016   CHOLHDL 5 02/14/2016  He is statin-intolerant. - last eye exam was 01/2016 >> No DR. Dr Claudean Kinds.   He had a detached retina in 03/2015 >> had Laser sx (his 3rd). He had cataract sx (R) 06/2015. - No sensation in his feet.  He had amputation of his big toe in 12/30/2012 2/2 infection >> osteomyelitis. He continued to have problems with ulcers on his left foot, in the setting of Charcot joint. He had a L BKA (02/23/2016) b/c he had a long h/o ulcer on his Charcot foot >> developed osteomyelitis.  He also has a history of Prostate cancer, s/p 25 RxTx, then radioactive seeds. Also, hyperlipidemia, hypertension, sleep apnea, osteoarthrosis.   ROS: Constitutional: no weight gain/no weight loss, no fatigue, no subjective hyperthermia, no subjective hypothermia Eyes: no blurry vision, no xerophthalmia ENT: no sore throat, no nodules palpated in throat, no dysphagia, no odynophagia, no hoarseness Cardiovascular: no CP/no SOB/no palpitations/no leg swelling Respiratory: no cough/no SOB/no wheezing Gastrointestinal: no N/no V/no D/no C/no acid reflux Musculoskeletal: no muscle aches/no joint aches Skin: no rashes, no hair loss Neurological: no tremors/no numbness/no tingling/no dizziness  I reviewed pt's medications, allergies, PMH, social hx, family hx, and changes were documented in the history of present illness. Otherwise, unchanged  from my initial visit note.  PE: BP 138/88 (BP Location: Left Arm, Patient Position: Sitting)   Pulse 85   Wt 273 lb (123.8 kg)   SpO2 97%   BMI 35.05 kg/m  Body mass index is 35.05 kg/m.   Wt Readings from Last 3 Encounters:  11/05/16 273 lb (123.8 kg)  04/02/16 293 lb (132.9 kg)  03/05/16 286 lb (129.7 kg)   Constitutional: overweight, in NAD Eyes: PERRLA, EOMI, no exophthalmos ENT: moist mucous membranes, no thyromegaly, no cervical lymphadenopathy Cardiovascular: RRR, No MRG Respiratory: CTA  B Gastrointestinal: abdomen soft, NT, ND, BS+ Musculoskeletal: strength intact in all 4,  + L BKA and right lower extremity edema  Skin: moist, warm, no rashes Neurological: no tremor with outstretched hands, DTR normal in all 4   ASSESSMENT: 1. DM2, insulin-dependent, uncontrolled, with complications - peripheral neuropathy - CAD-status post stent in 2005, then 5v CABG in 2008 - PVD - history of diabetic foot ulcer - Charcot foot - L - h/o amputation of his big toe in 12/2012 2/2 osteomyelitis and L BKA 01/2016 - history of retinal detachment in 2002-3  2. Obesity class 2 BMI Classification:  < 18.5 underweight   18.5-24.9 normal weight   25.0-29.9 overweight   30.0-34.9 class I obesity   35.0-39.9 class II obesity   ? 40.0 class III obesity   PLAN:  1. Patient uncontrolled DM2, on basal-bolus insulin regimen, now on the good diet regimen with increased fruit and vegetables. He tries to aim for 1200 cal a day and noticed that he lost 20 pounds in the last 6 months and almost all sugars are at goal. His sugars were higher during his nephrolithiasis episodes due to pain. He tells me that he could not eat during that period but his sugars still increased to 200s. He changed his Humalog doses to take lower amounts before his meals and increase his toujeo and is now taking a regimen of approximately 50-50% basal-bolus insulin. We will continue this regimen for now. At next visit, depending on his sugars, we may start decreasing his insulin doses. - I advised him to:  Patient Instructions  Please continue: - Metformin 1000 mg with dinner - Toujeo 60 units at bedtime - Humalog 20-24 units.   Please return in 4  months with your sugar log.  - today, HbA1c is 5.7% (wonderful) - continue checking sugars at different times of the day - check 3x a day, rotating checks - advised for yearly eye exams >> he is UTD - + flu shot today - Return to clinic in 4 mo with sugar log   2.  Obesity class 2 - doing great on his more plant-based diet >> congratulated him for the 20 lb-weight loss! He plans to continue.  Martin Kingdom, MD PhD [facility] Endocrinology

## 2016-11-07 DIAGNOSIS — R339 Retention of urine, unspecified: Secondary | ICD-10-CM | POA: Diagnosis not present

## 2016-11-07 DIAGNOSIS — N210 Calculus in bladder: Secondary | ICD-10-CM | POA: Diagnosis not present

## 2016-11-08 DIAGNOSIS — Z8546 Personal history of malignant neoplasm of prostate: Secondary | ICD-10-CM | POA: Diagnosis not present

## 2016-11-08 DIAGNOSIS — K8020 Calculus of gallbladder without cholecystitis without obstruction: Secondary | ICD-10-CM | POA: Diagnosis not present

## 2016-11-08 DIAGNOSIS — R8271 Bacteriuria: Secondary | ICD-10-CM | POA: Diagnosis not present

## 2016-11-08 DIAGNOSIS — R339 Retention of urine, unspecified: Secondary | ICD-10-CM | POA: Diagnosis not present

## 2016-11-13 DIAGNOSIS — Z87442 Personal history of urinary calculi: Secondary | ICD-10-CM | POA: Diagnosis not present

## 2016-11-21 ENCOUNTER — Ambulatory Visit (INDEPENDENT_AMBULATORY_CARE_PROVIDER_SITE_OTHER): Payer: Medicare Other | Admitting: Orthopedic Surgery

## 2016-11-21 ENCOUNTER — Encounter (INDEPENDENT_AMBULATORY_CARE_PROVIDER_SITE_OTHER): Payer: Self-pay | Admitting: Orthopedic Surgery

## 2016-11-21 DIAGNOSIS — Z89512 Acquired absence of left leg below knee: Secondary | ICD-10-CM

## 2016-11-21 DIAGNOSIS — L97511 Non-pressure chronic ulcer of other part of right foot limited to breakdown of skin: Secondary | ICD-10-CM

## 2016-11-21 DIAGNOSIS — I2510 Atherosclerotic heart disease of native coronary artery without angina pectoris: Secondary | ICD-10-CM

## 2016-11-21 DIAGNOSIS — Z794 Long term (current) use of insulin: Secondary | ICD-10-CM | POA: Diagnosis not present

## 2016-11-21 DIAGNOSIS — E1142 Type 2 diabetes mellitus with diabetic polyneuropathy: Secondary | ICD-10-CM | POA: Diagnosis not present

## 2016-11-21 DIAGNOSIS — L97221 Non-pressure chronic ulcer of left calf limited to breakdown of skin: Secondary | ICD-10-CM | POA: Diagnosis not present

## 2016-11-21 NOTE — Progress Notes (Signed)
Office Visit Note   Patient: [name]           Date of Birth: 1950-10-29           MRN: [id number] Visit Date: 11/21/2016              Requested by: [name], MD [place], [place] [id number] PCP: [name], MD  No chief complaint on file.     HPI: Patient is a 66 year old gentleman seen in follow up for diabetic insensate neuropathy left transtibial amputation with a ulcer on the residual limb on the left and an ulcer on the right little toe.  Assessment & Plan: Visit Diagnoses:  1. Ulcer of toe of right foot, limited to breakdown of skin ([place])   2. Non-pressure chronic ulcer of left calf, limited to breakdown of skin ([place])   3. Type 2 diabetes mellitus with diabetic polyneuropathy, with long-term current use of insulin (HCC)   4. Status post below knee amputation of left lower extremity (HCC)     Plan: Continue with the pressure loading with the Faraci and Silvadene dressing changes to the right little toe daily.  Continue with the compression shrinker for the left transtibial amputation.  Follow-Up Instructions: Return in about 4 weeks (around 12/19/2016).   Ortho Exam  Patient is alert, oriented, no adenopathy, well-dressed, normal affect, normal respiratory effort. On examination of patient's ulcer on the left transtibial amputation is healing quite well it is approximately 7 mm in width and 4 cm in length with healthy granulation tissue. Has filled in about 2 mm since last exam. there is no depth there is no hyper granulation tissue no cellulitis. no ischemic changes.  To the right foot he has an ulcer over the lateral aspect of the PIP joint of the right little toe. The ulcer is 5 mm in diameter 1 mm deep. Band-Aid and Iodosorb was applied. Has granulation in wound bed.   Imaging: No results found. No images are attached to the encounter.  Labs: Lab Results  Component Value Date   HGBA1C 5.7 11/05/2016   HGBA1C 7.3 05/13/2016     HGBA1C 6.1 02/14/2016   ESRSEDRATE 35 (H) 03/31/2013   CRP 5.3 (H) 03/31/2013   REPTSTATUS 11/03/2016 FINAL 11/01/2016   GRAMSTAIN  12/30/2012    FEW WBC PRESENT, PREDOMINANTLY PMN RARE SQUAMOUS EPITHELIAL CELLS PRESENT ABUNDANT GRAM NEGATIVE RODS MODERATE GRAM POSITIVE COCCI IN PAIRS IN CLUSTERS   CULT  11/01/2016    NO GROWTH Performed at [facility], [place] [address], [place], [place] [address]    [place] OXYTOCA 12/15/2012   LABORGA PSEUDOMONAS AERUGINOSA 12/15/2012    Orders:  No orders of the defined types were placed in this encounter.  No orders of the defined types were placed in this encounter.    Procedures: No procedures performed  Clinical Data: No additional findings.  ROS:  All other systems negative, except as noted in the HPI. Review of Systems  Constitutional: Negative for chills and fever.  Skin: Positive for wound. Negative for color change and rash.    Objective: Vital Signs: There were no vitals taken for this visit.  Specialty Comments:  No specialty comments available.  PMFS History: Patient Active Problem List   Diagnosis Date Noted  . Obesity, Class II, BMI 35-39.9 11/05/2016  . Ulcer of toe of right foot, limited to breakdown of skin ([place]) 10/11/2016  . Non-pressure chronic ulcer of left calf,  limited to breakdown of skin ([place]) 06/04/2016  . Idiopathic chronic venous hypertension of right lower extremity with inflammation 06/04/2016  . Status post below knee amputation of left lower extremity ([place]) 02/23/2016  . Type 2 diabetes mellitus with diabetic polyneuropathy, with long-term current use of insulin ([place]) 03/29/2015  . Advance directive discussed with patient 02/07/2014  . Normocytic anemia 04/01/2013  . Diabetic Charcot foot ([place]) 04/01/2013  . Routine general medical examination at a health care facility 12/25/2010  . HEMORRHOIDS-INTERNAL 11/22/2009  . PERSONAL HX COLONIC POLYPS 11/22/2009  . VENOUS  INSUFFICIENCY 06/29/2008  . Osteoarthritis, multiple sites 04/03/2007  . ERECTILE DYSFUNCTION, ORGANIC 06/13/2006  . Hyperlipemia 06/11/2006  . GLAUCOMA 06/11/2006  . Coronary atherosclerosis of native coronary artery 06/11/2006  . Obstructive sleep apnea 06/11/2006   Past Medical History:  Diagnosis Date  . Coronary atherosclerosis of unspecified type of vessel, native or graft   . Diabetes mellitus without complication ([place])   . Hemorrhage of rectum and anus   . History of kidney stones   . Impotence of organic origin   . Internal hemorrhoids without mention of complication   . Malignant neoplasm of prostate ([place])   . Mononeuritis of unspecified site   . Osteoarthrosis, unspecified whether generalized or localized, unspecified site   . Other and unspecified hyperlipidemia   . Personal history of colonic polyps   . Personal history of diabetic foot ulcer    saw wound center, resolved 05/08/2010  . Personal history of gallstones   . Routine general medical examination at a health care facility   . Type II or unspecified type diabetes mellitus with neurological manifestations, not stated as uncontrolled(250.60)   . Unspecified glaucoma(365.9)   . Unspecified sleep apnea    cpcp  . Unspecified venous (peripheral) insufficiency     Family History  Problem Relation Age of Onset  . Lung cancer Father   . Multiple sclerosis Mother   . Heart attack Unknown        paternal aunts and uncles  . Peripheral vascular disease Maternal Grandfather        several amputations    Past Surgical History:  Procedure Laterality Date  . AMPUTATION Left 12/30/2012   Procedure: AMPUTATION RAY ;  Surgeon: Meredith Pel, MD;  Location: [facility];  Service: Orthopedics;  Laterality: Left;  LEFT GREAT TOE RAY AMPUTATION  . AMPUTATION Left 02/23/2016   Procedure: Left Below Knee Amputation;  Surgeon: [name], MD;  Location: [facility];  Service: Orthopedics;  Laterality: Left;  . APPENDECTOMY    .  BASAL CELL CARCINOMA EXCISION  2/16   left forearm  . CARDIAC CATHETERIZATION  1998   Negative  . CATARACT EXTRACTION Right 2017   then lid surgery  . CORONARY ARTERY BYPASS GRAFT  09/2005   Post op AFIB  . EYE SURGERY    . FOOT BONE EXCISION Left 11/03/2013   DR DUDA   . I&D EXTREMITY Left 11/03/2013   Procedure: Left Foot Partial Bone Excision Cuboid and Medial Cuneiform, Wound Closures;  Surgeon: [name], MD;  Location: [facility];  Service: Orthopedics;  Laterality: Left;  . INSERTION PROSTATE RADIATION SEED  2009   RT and seeds for prostate cancer  . KIDNEY STONE SURGERY  04/1993  . RCA stents  04/2003   EF 55%  . RETINAL DETACHMENT SURGERY  2002-2003  . thrombosed vein  1993   Right leg   Social History   Occupational History  . Occupation:  Heavy equipment mechanic--retired  Tobacco Use  . Smoking status: Former Smoker    Packs/day: 2.00    Years: 35.00    Pack years: 70.00    Types: Cigars, Cigarettes    Last attempt to quit: 01/07/2001    Years since quitting: 15.8  . Smokeless tobacco: Never Used  Substance and Sexual Activity  . Alcohol use: Yes    Comment: rare  . Drug use: No  . Sexual activity: Yes    Partners: Female

## 2016-11-22 MED ORDER — NITROGLYCERIN 0.2 MG/HR TD PT24: 0.2000 mg | MEDICATED_PATCH | Freq: Every day | TRANSDERMAL | 12 refills | Status: DC

## 2016-11-22 NOTE — Addendum Note (Signed)
Addended by: Dondra Prader R on: 11/22/2016 08:42 AM   Modules accepted: Orders

## 2016-12-02 ENCOUNTER — Other Ambulatory Visit: Payer: Self-pay | Admitting: Internal Medicine

## 2016-12-19 ENCOUNTER — Encounter (INDEPENDENT_AMBULATORY_CARE_PROVIDER_SITE_OTHER): Payer: Self-pay | Admitting: Orthopedic Surgery

## 2016-12-19 ENCOUNTER — Telehealth: Payer: Self-pay | Admitting: Internal Medicine

## 2016-12-19 ENCOUNTER — Ambulatory Visit (INDEPENDENT_AMBULATORY_CARE_PROVIDER_SITE_OTHER): Payer: Medicare Other | Admitting: Orthopedic Surgery

## 2016-12-19 DIAGNOSIS — L97221 Non-pressure chronic ulcer of left calf limited to breakdown of skin: Secondary | ICD-10-CM | POA: Diagnosis not present

## 2016-12-19 DIAGNOSIS — I2510 Atherosclerotic heart disease of native coronary artery without angina pectoris: Secondary | ICD-10-CM

## 2016-12-19 DIAGNOSIS — L97919 Non-pressure chronic ulcer of unspecified part of right lower leg with unspecified severity: Secondary | ICD-10-CM | POA: Diagnosis not present

## 2016-12-19 DIAGNOSIS — I87331 Chronic venous hypertension (idiopathic) with ulcer and inflammation of right lower extremity: Secondary | ICD-10-CM

## 2016-12-19 DIAGNOSIS — L97511 Non-pressure chronic ulcer of other part of right foot limited to breakdown of skin: Secondary | ICD-10-CM

## 2016-12-19 NOTE — Telephone Encounter (Signed)
We do not do the statin therapy, if fax received it has been disregarded per Dr. Arman Filter request

## 2016-12-19 NOTE — Telephone Encounter (Signed)
[employer] called to verify a fax they sent to Dr [name] re: recommendation for statin therapy, lower cardiovascular risk. Please contact them at ph# [phone] for status.

## 2016-12-19 NOTE — Progress Notes (Signed)
Office Visit Note   Patient: [name]           Date of Birth: [birth year]           MRN: [id number] Visit Date: 12/19/2016              Requested by: [name], MD [address] [place], [place] [address] PCP: [name], MD  Chief Complaint  Patient presents with  . Left Leg - Wound Check    L BKA end bearing ulceration  . Right Foot - Wound Check      HPI: Patient is a 66 year old gentleman who presents for 3 separate issues #1 ulceration with infection right little toe #2 acute venous stasis insufficiency ulcer right leg #3 an bearing ulcer left transtibial amputation.  Patient feels like everything is improving slowly.  He is unsure where the venous ulcer came from.  Assessment & Plan: Visit Diagnoses:  1. Ulcer of toe of right foot, limited to breakdown of skin ([place])   2. Non-pressure chronic ulcer of left calf, limited to breakdown of skin ([place])   3. Idiopathic chronic venous hypertension of right lower extremity with ulcer and inflammation (HCC)     Plan: Recommended he wear compression stockings for the right lower extremity continue with the stump shrinker on the left he is to wear the double extra-large shrinker.  Continue with his extra-depth Basher wear for the superficial ulcer right little toe.  Follow-Up Instructions: Return in about 3 weeks (around 01/09/2017).   Ortho Exam  Patient is alert, oriented, no adenopathy, well-dressed, normal affect, normal respiratory effort. Examination the N bearing ulcer on the left transtibial amputation is healing well this is approximately 5 mm in diameter and 5 cm in length there is no redness no cellulitis no drainage no odor no signs of infection.  He has good hair growth on his legs no pain with range of motion of the knee.  Examination of the right lower extremity the cellulitis and ulcer is resolving on the little toe the ulcer is 3 mm in diameter 1 mm deep this does not probe to bone or tendon.  He  has a new venous stasis ulcer over the tibial crest this is approximately junction of the middle and distal third medially.  The ulcer is 2-1/2 cm in diameter and 2.1 mm deep with healthy granulation tissue is venous stasis changes pitting edema no cellulitis no odor no signs of infection.  Imaging: No results found. No images are attached to the encounter.  Labs: Lab Results  Component Value Date   HGBA1C 5.7 11/05/2016   HGBA1C 7.3 05/13/2016   HGBA1C 6.1 02/14/2016   ESRSEDRATE 35 (H) 03/31/2013   CRP 5.3 (H) 03/31/2013   REPTSTATUS 11/03/2016 FINAL 11/01/2016   GRAMSTAIN  12/30/2012    FEW WBC PRESENT, PREDOMINANTLY PMN RARE SQUAMOUS EPITHELIAL CELLS PRESENT ABUNDANT GRAM NEGATIVE RODS MODERATE GRAM POSITIVE COCCI IN PAIRS IN CLUSTERS   CULT  11/01/2016    NO GROWTH Performed at [facility], [place] [address], [place], [place] [address]    [place] OXYTOCA 12/15/2012   LABORGA PSEUDOMONAS AERUGINOSA 12/15/2012    @LABSALLVALUES (HGBA1)@  There is no height or weight on file to calculate BMI.  Orders:  No orders of the defined types were placed in this encounter.  No orders of the defined types were placed in this encounter.    Procedures: No procedures performed  Clinical Data: No additional findings.  ROS:  All other systems negative, except as noted in the HPI. Review of Systems  Objective: Vital Signs: There were no vitals taken for this visit.  Specialty Comments:  No specialty comments available.  PMFS History: Patient Active Problem List   Diagnosis Date Noted  . Obesity, Class II, BMI 35-39.9 11/05/2016  . Ulcer of toe of right foot, limited to breakdown of skin ([place]) 10/11/2016  . Non-pressure chronic ulcer of left calf, limited to breakdown of skin ([place]) 06/04/2016  . Idiopathic chronic venous hypertension of right lower extremity with ulcer and inflammation ([place]) 06/04/2016  . Status post below knee amputation of left lower  extremity ([place]) 02/23/2016  . Type 2 diabetes mellitus with diabetic polyneuropathy, with long-term current use of insulin ([place]) 03/29/2015  . Advance directive discussed with patient 02/07/2014  . Normocytic anemia 04/01/2013  . Diabetic Charcot foot ([place]) 04/01/2013  . Routine general medical examination at a health care facility 12/25/2010  . HEMORRHOIDS-INTERNAL 11/22/2009  . PERSONAL HX COLONIC POLYPS 11/22/2009  . VENOUS INSUFFICIENCY 06/29/2008  . Osteoarthritis, multiple sites 04/03/2007  . ERECTILE DYSFUNCTION, ORGANIC 06/13/2006  . Hyperlipemia 06/11/2006  . GLAUCOMA 06/11/2006  . Coronary atherosclerosis of native coronary artery 06/11/2006  . Obstructive sleep apnea 06/11/2006   Past Medical History:  Diagnosis Date  . Coronary atherosclerosis of unspecified type of vessel, native or graft   . Diabetes mellitus without complication ([place])   . Hemorrhage of rectum and anus   . History of kidney stones   . Impotence of organic origin   . Internal hemorrhoids without mention of complication   . Malignant neoplasm of prostate ([place])   . Mononeuritis of unspecified site   . Osteoarthrosis, unspecified whether generalized or localized, unspecified site   . Other and unspecified hyperlipidemia   . Personal history of colonic polyps   . Personal history of diabetic foot ulcer    saw wound center, resolved 05/08/2010  . Personal history of gallstones   . Routine general medical examination at a health care facility   . Type II or unspecified type diabetes mellitus with neurological manifestations, not stated as uncontrolled(250.60)   . Unspecified glaucoma(365.9)   . Unspecified sleep apnea    cpcp  . Unspecified venous (peripheral) insufficiency     Family History  Problem Relation Age of Onset  . Lung cancer Father   . Multiple sclerosis Mother   . Heart attack Unknown        paternal aunts and uncles  . Peripheral vascular disease Maternal Grandfather        several  amputations    Past Surgical History:  Procedure Laterality Date  . AMPUTATION Left 12/30/2012   Procedure: AMPUTATION RAY ;  Surgeon: Meredith Pel, MD;  [facility];  Service: Orthopedics;  Laterality: Left;  LEFT GREAT TOE RAY AMPUTATION  . AMPUTATION Left 02/23/2016   Procedure: Left Below Knee Amputation;  Surgeon: [name], MD;  Location: [facility];  Service: Orthopedics;  Laterality: Left;  . APPENDECTOMY    . BASAL CELL CARCINOMA EXCISION  2/16   left forearm  . CARDIAC CATHETERIZATION  1998   Negative  . CATARACT EXTRACTION Right 2017   then lid surgery  . CORONARY ARTERY BYPASS GRAFT  09/2005   Post op AFIB  . EYE SURGERY    . FOOT BONE EXCISION Left 11/03/2013   DR Terrin Imparato   . I&D EXTREMITY Left 11/03/2013   Procedure: Left Foot Partial Bone Excision Cuboid and  Medial Cuneiform, Wound Closures;  Surgeon: [name], MD;  Location: [facility];  Service: Orthopedics;  Laterality: Left;  . INSERTION PROSTATE RADIATION SEED  2009   RT and seeds for prostate cancer  . KIDNEY STONE SURGERY  04/1993  . RCA stents  04/2003   EF 55%  . RETINAL DETACHMENT SURGERY  2002-2003  . thrombosed vein  1993   Right leg   Social History   Occupational History  . Occupation: Heavy equipment mechanic--retired  Tobacco Use  . Smoking status: Former Smoker    Packs/day: 2.00    Years: 35.00    Pack years: 70.00    Types: Cigars, Cigarettes    Last attempt to quit: 01/07/2001    Years since quitting: 15.9  . Smokeless tobacco: Never Used  Substance and Sexual Activity  . Alcohol use: Yes    Comment: rare  . Drug use: No  . Sexual activity: Yes    Partners: Female

## 2016-12-20 ENCOUNTER — Encounter (INDEPENDENT_AMBULATORY_CARE_PROVIDER_SITE_OTHER): Payer: Self-pay | Admitting: Orthopedic Surgery

## 2017-01-09 ENCOUNTER — Encounter (INDEPENDENT_AMBULATORY_CARE_PROVIDER_SITE_OTHER): Payer: Self-pay | Admitting: Orthopedic Surgery

## 2017-01-09 ENCOUNTER — Ambulatory Visit (INDEPENDENT_AMBULATORY_CARE_PROVIDER_SITE_OTHER): Payer: Medicare Other | Admitting: Orthopedic Surgery

## 2017-01-09 DIAGNOSIS — I87331 Chronic venous hypertension (idiopathic) with ulcer and inflammation of right lower extremity: Secondary | ICD-10-CM

## 2017-01-09 DIAGNOSIS — L97221 Non-pressure chronic ulcer of left calf limited to breakdown of skin: Secondary | ICD-10-CM | POA: Diagnosis not present

## 2017-01-09 DIAGNOSIS — L97511 Non-pressure chronic ulcer of other part of right foot limited to breakdown of skin: Secondary | ICD-10-CM | POA: Diagnosis not present

## 2017-01-09 DIAGNOSIS — L97919 Non-pressure chronic ulcer of unspecified part of right lower leg with unspecified severity: Secondary | ICD-10-CM | POA: Diagnosis not present

## 2017-01-09 DIAGNOSIS — Z89512 Acquired absence of left leg below knee: Secondary | ICD-10-CM

## 2017-01-09 NOTE — Progress Notes (Addendum)
Office Visit Note   Patient: [name]           Date of Birth: [birth year]           MRN: [id number] Visit Date: 01/09/2017              Requested by: [name], MD [address] [place], [place] [address] PCP: [name], MD  Chief Complaint  Patient presents with  . Left Leg - Wound Check      HPI: Patient presents in follow-up for venous stasis swelling both lower extremities with a left transtibial amputation with venous stasis ulcer on the N bearing aspect of the residual limb a venous stasis ulcer on the medial aspect of the right calf and an ulcer on the right little toe.  Patient states that he has been using Silvadene on the wound has not always been wearing his compression stockings states that the 2 extra-large stump shrinker on the left can only last for about 2 hours and has been wearing the 3 extra-large shrinker on the left.  Assessment & Plan: Visit Diagnoses:  1. Ulcer of toe of right foot, limited to breakdown of skin ([place])   2. Idiopathic chronic venous hypertension of right lower extremity with ulcer and inflammation (H[facility])   3. Non-pressure chronic ulcer of left calf, limited to breakdown of skin ([place])   4. History of left below knee amputation ([place])     Plan: Recommended he wear the knee 3 extra-large shrinker on the left and wear the 2 extra-large shrinker for at least 2 hours a day to help decrease the swelling of the left lower extremity patient was given a prescription for 15-20 mm of compression knee-high stocking for the right lower extremity at [facility].  Patient is given a prescription for an extra large stocking for the right.  His calf measures 42 cm in circumference.  He is to wear the sock around the clock not use the Silvadene ointment this is macerating the skin.  Patient may discontinue the nitroglycerin patch the ulcer on the little toe has resolved.  Follow-Up Instructions: Return in about 4 weeks (around 02/06/2017).     Ortho Exam  Patient is alert, oriented, no adenopathy, well-dressed, normal affect, normal respiratory effort. Examination patient is ambulating in a wheelchair.  He has a palpable dorsalis pedis pulse on the right the ulcer on the little toe is healed the scab was removed the ulcer does not probe to bone or tendon.  He does have a 1 cm venous stasis ulcer on the right calf there is no cellulitis no drainage no odor no signs of infection he has brawny skin color change the leg with pitting edema up to the tibial tubercle.  Semination the left transtibial amputation he has good hair growth there is superficial epithelialization around the wound edges on the residual limb there is no odor no drainage no cellulitis no signs of infection there is no exposed bone or tendon.  There is significant venous stasis swelling in the left leg.  Imaging: No results found. No images are attached to the encounter.  Labs: Lab Results  Component Value Date   HGBA1C 5.7 11/05/2016   HGBA1C 7.3 05/13/2016   HGBA1C 6.1 02/14/2016   ESRSEDRATE 35 (H) 03/31/2013   CRP 5.3 (H) 03/31/2013   REPTSTATUS 11/03/2016 FINAL 11/01/2016   GRAMSTAIN  12/30/2012    FEW WBC PRESENT, PREDOMINANTLY PMN RARE SQUAMOUS EPITHELIAL CELLS PRESENT ABUNDANT  GRAM NEGATIVE RODS MODERATE GRAM POSITIVE COCCI IN PAIRS IN CLUSTERS   CULT  11/01/2016    NO GROWTH Performed at [facility], [place] [address], [place], [place] [address]    [place] OXYTOCA 12/15/2012   [place] PSEUDOMONAS AERUGINOSA 12/15/2012    @LABSALLVALUES (HGBA1)@  There is no height or weight on file to calculate BMI.  Orders:  No orders of the defined types were placed in this encounter.  No orders of the defined types were placed in this encounter.    Procedures: No procedures performed  Clinical Data: No additional findings.  ROS:  All other systems negative, except as noted in the HPI. Review of Systems  Objective: Vital  Signs: There were no vitals taken for this visit.  Specialty Comments:  No specialty comments available.  PMFS History: Patient Active Problem List   Diagnosis Date Noted  . Obesity, Class II, BMI 35-39.9 11/05/2016  . Ulcer of toe of right foot, limited to breakdown of skin ([place]) 10/11/2016  . Non-pressure chronic ulcer of left calf, limited to breakdown of skin ([place]) 06/04/2016  . Idiopathic chronic venous hypertension of right lower extremity with ulcer and inflammation ([place]) 06/04/2016  . Status post below knee amputation of left lower extremity ([place]) 02/23/2016  . Type 2 diabetes mellitus with diabetic polyneuropathy, with long-term current use of insulin ([place]) 03/29/2015  . Advance directive discussed with patient 02/07/2014  . Normocytic anemia 04/01/2013  . Diabetic Charcot foot ([place]) 04/01/2013  . Routine general medical examination at a health care facility 12/25/2010  . HEMORRHOIDS-INTERNAL 11/22/2009  . PERSONAL HX COLONIC POLYPS 11/22/2009  . VENOUS INSUFFICIENCY 06/29/2008  . Osteoarthritis, multiple sites 04/03/2007  . ERECTILE DYSFUNCTION, ORGANIC 06/13/2006  . Hyperlipemia 06/11/2006  . GLAUCOMA 06/11/2006  . Coronary atherosclerosis of native coronary artery 06/11/2006  . Obstructive sleep apnea 06/11/2006   Past Medical History:  Diagnosis Date  . Coronary atherosclerosis of unspecified type of vessel, native or graft   . Diabetes mellitus without complication ([place])   . Hemorrhage of rectum and anus   . History of kidney stones   . Impotence of organic origin   . Internal hemorrhoids without mention of complication   . Malignant neoplasm of prostate ([place])   . Mononeuritis of unspecified site   . Osteoarthrosis, unspecified whether generalized or localized, unspecified site   . Other and unspecified hyperlipidemia   . Personal history of colonic polyps   . Personal history of diabetic foot ulcer    saw wound center, resolved 05/08/2010  . Personal history of  gallstones   . Routine general medical examination at a health care facility   . Type II or unspecified type diabetes mellitus with neurological manifestations, not stated as uncontrolled(250.60)   . Unspecified glaucoma(365.9)   . Unspecified sleep apnea    cpcp  . Unspecified venous (peripheral) insufficiency     Family History  Problem Relation Age of Onset  . Lung cancer Father   . Multiple sclerosis Mother   . Heart attack Unknown        paternal aunts and uncles  . Peripheral vascular disease Maternal Grandfather        several amputations    Past Surgical History:  Procedure Laterality Date  . AMPUTATION Left 12/30/2012   Procedure: AMPUTATION RAY ;  Surgeon: Meredith Pel, MD;  Location: [facility];  Service: Orthopedics;  Laterality: Left;  LEFT GREAT TOE RAY AMPUTATION  . AMPUTATION Left 02/23/2016   Procedure: Left  Below Knee Amputation;  Surgeon: [name], MD;  Location: [facility];  Service: Orthopedics;  Laterality: Left;  . APPENDECTOMY    . BASAL CELL CARCINOMA EXCISION  2/16   left forearm  . CARDIAC CATHETERIZATION  1998   Negative  . CATARACT EXTRACTION Right 2017   then lid surgery  . CORONARY ARTERY BYPASS GRAFT  09/2005   Post op AFIB  . EYE SURGERY    . FOOT BONE EXCISION Left 11/03/2013   DR DUDA   . I&D EXTREMITY Left 11/03/2013   Procedure: Left Foot Partial Bone Excision Cuboid and Medial Cuneiform, Wound Closures;  Surgeon: [name], MD;  Location: [facility];  Service: Orthopedics;  Laterality: Left;  . INSERTION PROSTATE RADIATION SEED  2009   RT and seeds for prostate cancer  . KIDNEY STONE SURGERY  04/1993  . RCA stents  04/2003   EF 55%  . RETINAL DETACHMENT SURGERY  2002-2003  . thrombosed vein  1993   Right leg   Social History   Occupational History  . Occupation: Heavy equipment mechanic--retired  Tobacco Use  . Smoking status: Former Smoker    Packs/day: 2.00    Years: 35.00    Pack years: 70.00    Types: Cigars, Cigarettes     Last attempt to quit: 01/07/2001    Years since quitting: 16.0  . Smokeless tobacco: Never Used  Substance and Sexual Activity  . Alcohol use: Yes    Comment: rare  . Drug use: No  . Sexual activity: Yes    Partners: Female

## 2017-01-13 DIAGNOSIS — H02055 Trichiasis without entropian left lower eyelid: Secondary | ICD-10-CM | POA: Diagnosis not present

## 2017-02-11 DIAGNOSIS — H02051 Trichiasis without entropian right upper eyelid: Secondary | ICD-10-CM | POA: Diagnosis not present

## 2017-02-11 DIAGNOSIS — H0289 Other specified disorders of eyelid: Secondary | ICD-10-CM | POA: Diagnosis not present

## 2017-02-11 DIAGNOSIS — H02052 Trichiasis without entropian right lower eyelid: Secondary | ICD-10-CM | POA: Diagnosis not present

## 2017-02-12 ENCOUNTER — Ambulatory Visit (INDEPENDENT_AMBULATORY_CARE_PROVIDER_SITE_OTHER): Payer: Medicare Other | Admitting: Orthopedic Surgery

## 2017-02-19 ENCOUNTER — Encounter: Payer: Self-pay | Admitting: Internal Medicine

## 2017-02-19 ENCOUNTER — Ambulatory Visit (INDEPENDENT_AMBULATORY_CARE_PROVIDER_SITE_OTHER): Payer: Medicare Other | Admitting: Internal Medicine

## 2017-02-19 VITALS — BP 110/72 | HR 78 | Temp 97.7°F | Wt 286.0 lb

## 2017-02-19 DIAGNOSIS — Z794 Long term (current) use of insulin: Secondary | ICD-10-CM | POA: Diagnosis not present

## 2017-02-19 DIAGNOSIS — I2510 Atherosclerotic heart disease of native coronary artery without angina pectoris: Secondary | ICD-10-CM | POA: Diagnosis not present

## 2017-02-19 DIAGNOSIS — N183 Chronic kidney disease, stage 3 unspecified: Secondary | ICD-10-CM

## 2017-02-19 DIAGNOSIS — Z7189 Other specified counseling: Secondary | ICD-10-CM | POA: Diagnosis not present

## 2017-02-19 DIAGNOSIS — Z1211 Encounter for screening for malignant neoplasm of colon: Secondary | ICD-10-CM

## 2017-02-19 DIAGNOSIS — E1142 Type 2 diabetes mellitus with diabetic polyneuropathy: Secondary | ICD-10-CM | POA: Diagnosis not present

## 2017-02-19 DIAGNOSIS — I739 Peripheral vascular disease, unspecified: Secondary | ICD-10-CM

## 2017-02-19 DIAGNOSIS — I70261 Atherosclerosis of native arteries of extremities with gangrene, right leg: Secondary | ICD-10-CM | POA: Insufficient documentation

## 2017-02-19 DIAGNOSIS — Z0000 Encounter for general adult medical examination without abnormal findings: Secondary | ICD-10-CM

## 2017-02-19 LAB — LIPID PANEL
CHOLESTEROL: 107 mg/dL (ref 0–200)
HDL: 26.4 mg/dL — AB (ref >39.00)
LDL Cholesterol: 52 mg/dL (ref 0–99)
NonHDL: 80.77
TRIGLYCERIDES: 143 mg/dL (ref 0.0–149.0)
Total CHOL/HDL Ratio: 4
VLDL: 28.6 mg/dL (ref 0.0–40.0)

## 2017-02-19 LAB — RENAL FUNCTION PANEL
Albumin: 4.1 g/dL (ref 3.5–5.2)
BUN: 18 mg/dL (ref 6–23)
CO2: 29 mEq/L (ref 19–32)
CREATININE: 1.11 mg/dL (ref 0.40–1.50)
Calcium: 9.6 mg/dL (ref 8.4–10.5)
Chloride: 103 mEq/L (ref 96–112)
GFR: 69.86 mL/min (ref >60.00)
Glucose, Bld: 143 mg/dL — ABNORMAL HIGH (ref 70–99)
PHOSPHORUS: 3.8 mg/dL (ref 2.3–4.6)
Potassium: 5 mEq/L (ref 3.5–5.1)
Sodium: 139 mEq/L (ref 135–145)

## 2017-02-19 LAB — HM DIABETES FOOT EXAM

## 2017-02-19 NOTE — Assessment & Plan Note (Signed)
Lab Results  Component Value Date   HGBA1C 5.7 11/05/2016   Good control without hypoglycemia Vascular, renal, neuro complications

## 2017-02-19 NOTE — Progress Notes (Signed)
Hearing Screening Comments: Has high pitch hearing loss. Does not wear hearing aids Vision Screening Comments: January 2019

## 2017-02-19 NOTE — Assessment & Plan Note (Signed)
No current symptoms

## 2017-02-19 NOTE — Assessment & Plan Note (Signed)
On losartan Asked him to stop or limit NSAID---discussed tylenol

## 2017-02-19 NOTE — Progress Notes (Signed)
Subjective:    Patient ID: [name], male    DOB: [birth year], 67 y.o.   MRN: [id number]  HPI Here with girlfriend for Medicare wellness visit and follow up of chronic health conditions Reviewed form and advanced directives Reviewed other doctors Past smoker No alcohol Just does some leg exercise Hard getting out with the amputation Does own ADLs---helps some with housework Vision is okay Poor hearing---high frequency. Not ready for hearing aides No falls No depression or anhedonia No apparent memory problems  Has to have eyelid surgery soon Okay to hold the aspirin for a week before this  Continues under Dr Jess Barters care Ongoing stump and right foot ulcers  Has prosthesis---can't wear it due to blister and subsequent small ulcer area Right foot mostly better (laterally on 5th toe). Now has cut out side of sneaker Better with nitroglycerin patches on foot  Diabetes control remains good Continues with Dr [name] No hypoglycemic reactions Not much sensation in foot at all Known vascular complications CKD 3 also----- discussed aleve (he uses once every 2-3 weeks). Asked him to try tylenol instead  No chest pain  No SOB No dizziness or syncope  Current Outpatient Medications on File Prior to Visit  Medication Sig Dispense Refill  . ACCU-CHEK FASTCLIX LANCETS MISC Use to check sugar 3 times daily dx- E11.42 300 each 5  . aspirin 325 MG tablet Take 325 mg by mouth daily.      . B-D UF III MINI PEN NEEDLES 31G X 5 MM MISC USE TO INJECT INSULIN FOUR TIMES DAILY AS INSTRUCTED (FOR DIABETES) 360 each 2  . Blood Glucose Monitoring Suppl (ACCU-CHEK GUIDE) w/Device KIT 1 kit by Other route 3 (three) times daily. Use to check blood sugars 3 times daily, Dx Code E11.42 1 kit 1  . carvedilol (COREG) 25 MG tablet TAKE 1 TABLET TWICE DAILY (Patient taking differently: TAKE 1 TABLET PO TWICE DAILY) 180 tablet 3  . glucose blood (ACCU-CHEK GUIDE) test strip Use as instructed to check  sugar 3 times daily dx- E11.42 300 each 5  . losartan-hydrochlorothiazide (HYZAAR) 50-12.5 MG tablet TAKE 1 TABLET EVERY DAY 90 tablet 3  . metFORMIN (GLUCOPHAGE) 1000 MG tablet TAKE 1 TABLET TWICE DAILY (Patient taking differently: TAKE 1 TABLET Daily) 180 tablet 1  . mupirocin ointment (BACTROBAN) 2 % Apply to wound daily (Patient taking differently: Apply 1 application topically daily as needed (wound healing). Apply to wound daily) 22 g 0  . naproxen sodium (ANAPROX) 220 MG tablet Take 440 mg by mouth 2 (two) times daily as needed (for pain.). For pain    . nitroGLYCERIN (NITRODUR - DOSED IN MG/24 HR) 0.2 mg/hr patch Place 1 patch (0.2 mg total) daily onto the skin. 30 patch 12  . NOVOLOG FLEXPEN 100 UNIT/ML FlexPen INJECT  30  TO  35 UNITS SUBCUTANEOUSLY THREE TIMES DAILY WITH MEALS (Patient taking differently: INJECT  24  TO  30 UNITS SUBCUTANEOUSLY THREE TIMES DAILY WITH MEALS) 90 mL 1  . potassium citrate (UROCIT-K) 10 MEQ (1080 MG) SR tablet Take 20 mEq by mouth 2 (two) times daily.   12  . Probiotic Product (PROBIOTIC DAILY PO) Take 1 capsule by mouth daily.    . TOUJEO SOLOSTAR 300 UNIT/ML SOPN INJECT 60 UNITS INTO THE SKIN TWICE DAILY (Patient taking differently: INJECT 60 UNITS INTO THE SKIN DAILY) 24 pen 1  . triamcinolone (KENALOG) 0.025 % cream APPLY 1 APPLICATION TOPICALLY 3 (THREE) TIMES DAILY AS NEEDED. (Patient taking differently:  APPLY 1 APPLICATION TOPICALLY 3 (THREE) TIMES DAILY AS NEEDED FOR DRY SKIN AND RASH) 30 g 3   No current facility-administered medications on file prior to visit.     Allergies  Allergen Reactions  . Atorvastatin Other (See Comments)    REACTION: myalgias  . Ezetimibe Other (See Comments)    Body ache  . Simvastatin Other (See Comments)    Body ache    Past Medical History:  Diagnosis Date  . Coronary atherosclerosis of unspecified type of vessel, native or graft   . Diabetes mellitus without complication ([place])   . Hemorrhage of rectum and  anus   . History of kidney stones   . Impotence of organic origin   . Internal hemorrhoids without mention of complication   . Malignant neoplasm of prostate ([place])   . Mononeuritis of unspecified site   . Osteoarthrosis, unspecified whether generalized or localized, unspecified site   . Other and unspecified hyperlipidemia   . Personal history of colonic polyps   . Personal history of diabetic foot ulcer    saw wound center, resolved 05/08/2010  . Personal history of gallstones   . Routine general medical examination at a health care facility   . Type II or unspecified type diabetes mellitus with neurological manifestations, not stated as uncontrolled(250.60)   . Unspecified glaucoma(365.9)   . Unspecified sleep apnea    cpcp  . Unspecified venous (peripheral) insufficiency     Past Surgical History:  Procedure Laterality Date  . AMPUTATION Left 12/30/2012   Procedure: AMPUTATION RAY ;  Surgeon: Meredith Pel, MD;  [facility];  Service: Orthopedics;  Laterality: Left;  LEFT GREAT TOE RAY AMPUTATION  . AMPUTATION Left 02/23/2016   Procedure: Left Below Knee Amputation;  Surgeon: [name], MD;  Location: [facility];  Service: Orthopedics;  Laterality: Left;  . APPENDECTOMY    . BASAL CELL CARCINOMA EXCISION  2/16   left forearm  . CARDIAC CATHETERIZATION  1998   Negative  . CATARACT EXTRACTION Right 2017   then lid surgery  . CORONARY ARTERY BYPASS GRAFT  09/2005   Post op AFIB  . EYE SURGERY    . FOOT BONE EXCISION Left 11/03/2013   DR DUDA   . I&D EXTREMITY Left 11/03/2013   Procedure: Left Foot Partial Bone Excision Cuboid and Medial Cuneiform, Wound Closures;  Surgeon: [name], MD;  Location: [facility];  Service: Orthopedics;  Laterality: Left;  . INSERTION PROSTATE RADIATION SEED  2009   RT and seeds for prostate cancer  . KIDNEY STONE SURGERY  04/1993  . RCA stents  04/2003   EF 55%  . RETINAL DETACHMENT SURGERY  2002-2003  . thrombosed vein  1993   Right leg     Family History  Problem Relation Age of Onset  . Lung cancer Father   . Multiple sclerosis Mother   . Heart attack Unknown        paternal aunts and uncles  . Peripheral vascular disease Maternal Grandfather        several amputations    Social History   Socioeconomic History  . Marital status: Widowed    Spouse name: Not on file  . Number of children: 3  . Years of education: Not on file  . Highest education level: Not on file  Social Needs  . Financial resource strain: Not on file  . Food insecurity - worry: Not on file  . Food insecurity - inability: Not on file  .  Transportation needs - medical: Not on file  . Transportation needs - non-medical: Not on file  Occupational History  . Occupation: Heavy equipment mechanic--retired  Tobacco Use  . Smoking status: Former Smoker    Packs/day: 2.00    Years: 35.00    Pack years: 70.00    Types: Cigars, Cigarettes    Last attempt to quit: 01/07/2001    Years since quitting: 16.1  . Smokeless tobacco: Never Used  Substance and Sexual Activity  . Alcohol use: Yes    Comment: rare  . Drug use: No  . Sexual activity: Yes    Partners: Female  Other Topics Concern  . Not on file  Social History Narrative   Has living will   Daughter [name] is [place] health care POA    Would accept resuscitation attempts--but no prolonged artificial ventilation   No tube feeds if cognitively unaware   Review of Systems Appetite is "too good" Weight back up some Sleeps well Wears seat belt Teeth okay---doesn't see dentist Knee/shoulder pain and some in hands (has cold feeling in tips of fingers---?neuropathy) No heartburn or dysphagia Bowels okay. No blood Voids okay--did have retention in October (after passing 12 kidney stones). Foley for 1 week---then out and voiding okay. Potassium citrate doubled. No other skin issues Does bruise easily    Objective:   Physical Exam  Constitutional: He is oriented to person, place, and  time. No distress.  HENT:  Mouth/Throat: Oropharynx is clear and moist. No oropharyngeal exudate.  Neck: No thyromegaly present.  Cardiovascular: Normal rate, regular rhythm and normal heart sounds. Exam reveals no gallop.  No murmur heard. No pulse on right  Pulmonary/Chest: Effort normal and breath sounds normal. No respiratory distress. He has no wheezes. He has no rales.  Abdominal: Soft. He exhibits no distension. There is no tenderness. There is no rebound and no guarding.  Musculoskeletal: He exhibits no tenderness.  No sig edema  Lymphadenopathy:    He has no cervical adenopathy.  Neurological: He is alert and oriented to person, place, and time.  President-- "Daisy Floro, Obama, Bush" [phone] [id number] Recall 3/3  ~no sensation in foot  Skin:  Stasis changes in right calf Scabbed ulcer right 5th toe Scabbed area on base of left stump          Assessment & Plan:

## 2017-02-19 NOTE — Assessment & Plan Note (Signed)
I have personally reviewed the Medicare Annual Wellness questionnaire and have noted 1. The patient's medical and social history 2. Their use of alcohol, tobacco or illicit drugs 3. Their current medications and supplements 4. The patient's functional ability including ADL's, fall risks, home safety risks and hearing or visual             impairment. 5. Diet and physical activities 6. Evidence for depression or mood disorders  The patients weight, height, BMI and visual acuity have been recorded in the chart I have made referrals, counseling and provided education to the patient based review of the above and I have provided the pt with a written personalized care plan for preventive services.  I have provided you with a copy of your personalized plan for preventive services. Please take the time to review along with your updated medication list.  Yearly flu vaccine Consider shingrix when available Recent PSA Prefers no colon--will do FIT Discussed fitness

## 2017-02-19 NOTE — Assessment & Plan Note (Signed)
BMI over 35 and multiple comorbidities Discussed fitness/weight loss

## 2017-02-19 NOTE — Assessment & Plan Note (Signed)
See social history 

## 2017-02-19 NOTE — Assessment & Plan Note (Signed)
S/P left BKA Ongoing issues on right

## 2017-02-24 ENCOUNTER — Encounter (INDEPENDENT_AMBULATORY_CARE_PROVIDER_SITE_OTHER): Payer: Self-pay | Admitting: Orthopedic Surgery

## 2017-02-24 ENCOUNTER — Ambulatory Visit (INDEPENDENT_AMBULATORY_CARE_PROVIDER_SITE_OTHER): Payer: Medicare Other | Admitting: Orthopedic Surgery

## 2017-02-24 VITALS — Ht 74.0 in | Wt 286.0 lb

## 2017-02-24 DIAGNOSIS — I872 Venous insufficiency (chronic) (peripheral): Secondary | ICD-10-CM | POA: Diagnosis not present

## 2017-02-24 DIAGNOSIS — I87331 Chronic venous hypertension (idiopathic) with ulcer and inflammation of right lower extremity: Secondary | ICD-10-CM

## 2017-02-24 DIAGNOSIS — L97919 Non-pressure chronic ulcer of unspecified part of right lower leg with unspecified severity: Secondary | ICD-10-CM | POA: Diagnosis not present

## 2017-02-24 DIAGNOSIS — L97221 Non-pressure chronic ulcer of left calf limited to breakdown of skin: Secondary | ICD-10-CM

## 2017-02-24 DIAGNOSIS — I2510 Atherosclerotic heart disease of native coronary artery without angina pectoris: Secondary | ICD-10-CM

## 2017-02-24 DIAGNOSIS — S91209A Unspecified open wound of unspecified toe(s) with damage to nail, initial encounter: Secondary | ICD-10-CM

## 2017-02-24 DIAGNOSIS — Z89512 Acquired absence of left leg below knee: Secondary | ICD-10-CM

## 2017-02-24 DIAGNOSIS — B351 Tinea unguium: Secondary | ICD-10-CM

## 2017-02-24 MED ORDER — SILVER SULFADIAZINE 1 % EX CREA: 1.0000 "application " | TOPICAL_CREAM | Freq: Every day | CUTANEOUS | 0 refills | Status: DC

## 2017-02-24 NOTE — Progress Notes (Signed)
Office Visit Note   Patient: [name]           Date of Birth: 09-23-1950           MRN: [id number] Visit Date: 02/24/2017              Requested by: [name], MD [address] [place], [place] [address] PCP: [name], MD  Chief Complaint  Patient presents with  . Left Leg - Follow-up, Wound Check  . Right Foot - Follow-up, Wound Check, Nail Problem      HPI: Patient presents in follow-up for venous stasis swelling both lower extremities with a left transtibial amputation with venous stasis ulcer on the end bearing aspect of the residual limb a venous stasis ulcer on the medial aspect of the right calf and an ulcer on the right little toe. Today has avulsed his right 3rd toe nail just since showering prior to appointment. Patient states that he has been using Silvadene on the wound about half the time. On other days wears shrinker or compression sock with no ointment.   Assessment & Plan: Visit Diagnoses:  No diagnosis found.  Plan: Continue compression garments. May use Silvadene ointment, but please monitor for maceration.  Vaseline gauze dressing applied to 3rd toenail bed. Given remainder of vaseline gauze for dressing changes.  Follow-Up Instructions: No Follow-up on file.   Ortho Exam  Patient is alert, oriented, no adenopathy, well-dressed, normal affect, normal respiratory effort. Examination patient is ambulating in a wheelchair.  He has a palpable dorsalis pedis pulse on the right the ulcer on the little toe is healed. The 3rd toenail is avulsed, underlying nailbed bloody. No erythema or swelling. No sign of infection.  He does have a 1 cm venous stasis ulcer on the right calf, this is well healed after removal of scab. there is no cellulitis no drainage no odor no signs of infection he has brawny skin color change the leg with pitting edema up to the tibial tubercle.  Examination the left transtibial amputation he has good hair growth there is  superficial epithelialization around the wound edges on the residual limb. Is now 5 cm x 11 mm in size. there is no odor no drainage no cellulitis no signs of infection there is no exposed bone or tendon.  There is significant venous stasis swelling in the left leg.  Imaging: No results found. No images are attached to the encounter.  Labs: Lab Results  Component Value Date   HGBA1C 5.7 11/05/2016   HGBA1C 7.3 05/13/2016   HGBA1C 6.1 02/14/2016   ESRSEDRATE 35 (H) 03/31/2013   CRP 5.3 (H) 03/31/2013   REPTSTATUS 11/03/2016 FINAL 11/01/2016   GRAMSTAIN  12/30/2012    FEW WBC PRESENT, PREDOMINANTLY PMN RARE SQUAMOUS EPITHELIAL CELLS PRESENT ABUNDANT GRAM NEGATIVE RODS MODERATE GRAM POSITIVE COCCI IN PAIRS IN CLUSTERS   CULT  11/01/2016    NO GROWTH Performed at [facility], [place] [address] [place], [place] [address]    [place] OXYTOCA 12/15/2012   LABORGA PSEUDOMONAS AERUGINOSA 12/15/2012    @LABSALLVALUES (HGBA1)@  Body mass index is 36.72 kg/m.  Orders:  No orders of the defined types were placed in this encounter.  No orders of the defined types were placed in this encounter.    Procedures: No procedures performed  Clinical Data: No additional findings.  ROS:  All other systems negative, except as noted in the HPI. Review of Systems  Constitutional: Negative  for chills and fever.  Cardiovascular: Positive for leg swelling.  Skin: Positive for wound. Negative for color change.    Objective: Vital Signs: Ht 6\' 2"  (1.88 m)   Wt 286 lb (129.7 kg)   BMI 36.72 kg/m   Specialty Comments:  No specialty comments available.  PMFS History: Patient Active Problem List   Diagnosis Date Noted  . PAD (peripheral artery disease) ([place]) 02/19/2017  . Chronic renal disease, stage III ([place]) 02/19/2017  . Morbid obesity ([place]) 11/05/2016  . Ulcer of toe of right foot, limited to breakdown of skin ([place]) 10/11/2016  . Non-pressure chronic ulcer of left  calf, limited to breakdown of skin ([place]) 06/04/2016  . Idiopathic chronic venous hypertension of right lower extremity with ulcer and inflammation ([place]) 06/04/2016  . Status post below knee amputation of left lower extremity ([place]) 02/23/2016  . Type 2 diabetes mellitus with diabetic polyneuropathy, with long-term current use of insulin ([place]) 03/29/2015  . Advance directive discussed with patient 02/07/2014  . Normocytic anemia 04/01/2013  . Diabetic Charcot foot ([place]) 04/01/2013  . Routine general medical examination at a health care facility 12/25/2010  . HEMORRHOIDS-INTERNAL 11/22/2009  . PERSONAL HX COLONIC POLYPS 11/22/2009  . VENOUS INSUFFICIENCY 06/29/2008  . Osteoarthritis, multiple sites 04/03/2007  . ERECTILE DYSFUNCTION, ORGANIC 06/13/2006  . Hyperlipemia 06/11/2006  . GLAUCOMA 06/11/2006  . Coronary atherosclerosis of native coronary artery 06/11/2006  . Obstructive sleep apnea 06/11/2006   Past Medical History:  Diagnosis Date  . Coronary atherosclerosis of unspecified type of vessel, native or graft   . Diabetes mellitus without complication ([place])   . Hemorrhage of rectum and anus   . History of kidney stones   . Impotence of organic origin   . Internal hemorrhoids without mention of complication   . Malignant neoplasm of prostate ([place])   . Mononeuritis of unspecified site   . Osteoarthrosis, unspecified whether generalized or localized, unspecified site   . Other and unspecified hyperlipidemia   . Personal history of colonic polyps   . Personal history of diabetic foot ulcer    saw wound center, resolved 05/08/2010  . Personal history of gallstones   . Routine general medical examination at a health care facility   . Type II or unspecified type diabetes mellitus with neurological manifestations, not stated as uncontrolled(250.60)   . Unspecified glaucoma(365.9)   . Unspecified sleep apnea    cpcp  . Unspecified venous (peripheral) insufficiency     Family History    Problem Relation Age of Onset  . Lung cancer Father   . Multiple sclerosis Mother   . Heart attack Unknown        paternal aunts and uncles  . Peripheral vascular disease Maternal Grandfather        several amputations    Past Surgical History:  Procedure Laterality Date  . AMPUTATION Left 12/30/2012   Procedure: AMPUTATION RAY ;  Surgeon: Meredith Pel, MD;  Location: [facility];  Service: Orthopedics;  Laterality: Left;  LEFT GREAT TOE RAY AMPUTATION  . AMPUTATION Left 02/23/2016   Procedure: Left Below Knee Amputation;  Surgeon: [name], MD;  Location: [facility];  Service: Orthopedics;  Laterality: Left;  . APPENDECTOMY    . BASAL CELL CARCINOMA EXCISION  2/16   left forearm  . CARDIAC CATHETERIZATION  1998   Negative  . CATARACT EXTRACTION Right 2017   then lid surgery  . CORONARY ARTERY BYPASS GRAFT  09/2005   Post op AFIB  .  EYE SURGERY    . FOOT BONE EXCISION Left 11/03/2013   DR DUDA   . I&D EXTREMITY Left 11/03/2013   Procedure: Left Foot Partial Bone Excision Cuboid and Medial Cuneiform, Wound Closures;  Surgeon: [name], MD;  Location: [facility];  Service: Orthopedics;  Laterality: Left;  . INSERTION PROSTATE RADIATION SEED  2009   RT and seeds for prostate cancer  . KIDNEY STONE SURGERY  04/1993  . RCA stents  04/2003   EF 55%  . RETINAL DETACHMENT SURGERY  2002-2003  . thrombosed vein  1993   Right leg   Social History   Occupational History  . Occupation: Heavy equipment mechanic--retired  Tobacco Use  . Smoking status: Former Smoker    Packs/day: 2.00    Years: 35.00    Pack years: 70.00    Types: Cigars, Cigarettes    Last attempt to quit: 01/07/2001    Years since quitting: 16.1  . Smokeless tobacco: Never Used  Substance and Sexual Activity  . Alcohol use: Yes    Comment: rare  . Drug use: No  . Sexual activity: Yes    Partners: Female

## 2017-02-26 ENCOUNTER — Other Ambulatory Visit (INDEPENDENT_AMBULATORY_CARE_PROVIDER_SITE_OTHER): Payer: Medicare Other

## 2017-02-26 DIAGNOSIS — Z1211 Encounter for screening for malignant neoplasm of colon: Secondary | ICD-10-CM

## 2017-02-26 LAB — FECAL OCCULT BLOOD, IMMUNOCHEMICAL: Fecal Occult Bld: NEGATIVE

## 2017-03-06 ENCOUNTER — Ambulatory Visit (INDEPENDENT_AMBULATORY_CARE_PROVIDER_SITE_OTHER): Payer: Medicare Other | Admitting: Internal Medicine

## 2017-03-06 ENCOUNTER — Encounter: Payer: Self-pay | Admitting: Internal Medicine

## 2017-03-06 VITALS — BP 136/80 | HR 82 | Temp 97.4°F | Resp 16 | Ht 74.0 in | Wt 281.0 lb

## 2017-03-06 DIAGNOSIS — E785 Hyperlipidemia, unspecified: Secondary | ICD-10-CM

## 2017-03-06 DIAGNOSIS — I2510 Atherosclerotic heart disease of native coronary artery without angina pectoris: Secondary | ICD-10-CM

## 2017-03-06 DIAGNOSIS — Z794 Long term (current) use of insulin: Secondary | ICD-10-CM

## 2017-03-06 DIAGNOSIS — E1142 Type 2 diabetes mellitus with diabetic polyneuropathy: Secondary | ICD-10-CM | POA: Diagnosis not present

## 2017-03-06 LAB — POCT GLYCOSYLATED HEMOGLOBIN (HGB A1C): Hemoglobin A1C: 6.3

## 2017-03-06 MED ORDER — METFORMIN HCL 1000 MG PO TABS: 1000.0000 mg | ORAL_TABLET | Freq: Every day | ORAL | 3 refills | Status: DC

## 2017-03-06 MED ORDER — INSULIN GLARGINE 300 UNIT/ML ~~LOC~~ SOPN: 60.0000 [IU] | PEN_INJECTOR | Freq: Every day | SUBCUTANEOUS | 3 refills | Status: DC

## 2017-03-06 NOTE — Patient Instructions (Addendum)
Please continue: - Metformin 1000 mg with dinner - Toujeo 60 units at bedtime - Humalog 24-30 units before meals   Please return in 4 months with your sugar log.

## 2017-03-06 NOTE — Progress Notes (Signed)
Patient ID: [name], male   DOB: 1950-03-10, 67 y.o.   MRN: [id number]  HPI: [name] is a 67 y.o.-year-old male, returning for f/u for DM2, dx 2008, insulin-dependent since dx, uncontrolled, with complications (peripheral neuropathy, CAD-status post CABG in 2008, PVD, Charcot foot, history of diabetic foot ulcer, history of retinal detachment in 2002-3). Last visit 4 mo ago. He is here with his wife who offers part of the hx, especially regarding his diet and CBGs. He has Production designer, theatre/television/film and supplemental insurance - [place]. Also Part D - [employer].    He just restarted on his diet (more plant-based ) 2 weeks ago >> sugars better.  Last hemoglobin A1c was: Lab Results  Component Value Date   HGBA1C 5.7 11/05/2016   HGBA1C 7.3 05/13/2016   HGBA1C 6.1 02/14/2016   Pt is on a regimen of: - Metformin 1000 mg with dinner - Toujeo 60 units at bedtime - Humalog 20-24 >> 24-30 units before meals  Pt checks his sugars 1-3x a day: - am: 101-145, 153 >> 106-147, 160 (candy), 180 (grapes) - before lunch: 69, 104-154, 207 >> n/c >> 56 >> n/c - before dinner: 126, 141-184, 211 >> 62-132 >> 109-161 - bedtime:   68-109, 136, 254, 293 >> 96-160 Lowest: 131 >> 59 x1 >> 122 >> 56 x1 (lunchtime) >> 96. Highest sugar:240 >> 207 >> 304 >> 293 (pain) >> 180.  Pt's meals are now: - Breakfast: bacon, eggs, tomato sandwich (sometimes no bread) >> coffee + fruit - Lunch: PB crackers >> baked fish or chicken + veggies - Dinner: meat + 2 veggies  >> same as lunch - Snacks: 1 a day - 2x a week, icecream  >> fruit Uses Splenda in tea and coffee.  - + CKD, last BUN/creatinine:  Lab Results  Component Value Date   BUN 18 02/19/2017   CREATININE 1.11 02/19/2017  On losartan. - last set of lipids: Lab Results  Component Value Date   CHOL 107 02/19/2017   HDL 26.40 (L) 02/19/2017   LDLCALC 52 02/19/2017   LDLDIRECT 72.0 02/13/2015   TRIG 143.0 02/19/2017   CHOLHDL 4 02/19/2017  He is statin  intolerant. - last eye exam was 01/2017: No DR .  Dr. Claudean Kinds.  He had a detached retina in 03/2015 >> had Laser sx (his 3rd). He had cataract sx (R) 06/2015. - No sensation in his feet.  He had amputation of his big toe in 12/30/2012 2/2 infection >> osteomyelitis. He continued to have problems with ulcers on his left foot, in the setting of Charcot joint. He had a L BKA (02/23/2016) b/c he had a long h/o ulcer on his Charcot foot >> developed osteomyelitis. He now has his prosthesis off ans he developed a blister on his stump.  He also has a history of Prostate cancer, s/p 25 RxTx, then radioactive seeds. Also, HTN, OSA, OA.  ROS: Constitutional: no weight gain/no weight loss, no fatigue, no subjective hyperthermia, no subjective hypothermia Eyes: no blurry vision, no xerophthalmia ENT: no sore throat, no nodules palpated in throat, no dysphagia, no odynophagia, no hoarseness Cardiovascular: no CP/no SOB/no palpitations/no leg swelling Respiratory: no cough/no SOB/no wheezing Gastrointestinal: no N/no V/no D/no C/no acid reflux Musculoskeletal: no muscle aches/no joint aches Skin: no rashes, no hair loss Neurological: no tremors/no numbness/no tingling/no dizziness  I reviewed pt's medications, allergies, PMH, social hx, family hx, and changes were documented in the history of present illness. Otherwise, unchanged from my initial  visit note.  PE: BP 136/80   Pulse 82   Temp (!) 97.4 F (36.3 C) (Oral)   Resp 16   Ht 6\' 2"  (1.88 m)   Wt 281 lb (127.5 kg)   SpO2 99%   BMI 36.08 kg/m  Body mass index is 36.08 kg/m.   Wt Readings from Last 3 Encounters:  03/06/17 281 lb (127.5 kg)  02/24/17 286 lb (129.7 kg)  02/19/17 286 lb (129.7 kg)   Constitutional: overweight, in NAD Eyes: PERRLA, EOMI, no exophthalmos ENT: moist mucous membranes, no thyromegaly, no cervical lymphadenopathy Cardiovascular: RRR, No MRG Respiratory: CTA B Gastrointestinal: abdomen soft, NT, ND,  BS+ Musculoskeletal: strength intact in all 4, + L BKA and right lower extremity edema  Skin: moist, warm, no rashes Neurological: no tremor with outstretched hands, DTR normal in all 4   ASSESSMENT: 1. DM2, insulin-dependent, uncontrolled, with complications - peripheral neuropathy - CAD-status post stent in 2005, then 5v CABG in 2008 - PVD - history of diabetic foot ulcer - Charcot foot - L - h/o amputation of his big toe in 12/2012 2/2 osteomyelitis and L BKA 01/2016 - history of retinal detachment in 2002-3  2. Obesity class 2 BMI Classification:  < 18.5 underweight   18.5-24.9 normal weight   25.0-29.9 overweight   30.0-34.9 class I obesity   35.0-39.9 class II obesity   ? 40.0 class III obesity   3. HL  PLAN:  1. Patient with previously uncontrolled type 2 diabetes, on basal-bolus insulin regimen, with better control after he started to eat a more plant-based diet.  He lost 20 pounds before her last visit but gained 10 back during the holidays. - his sugars also increased during the Holidays >> 140-160s before meals, but they started to decrease after he restarted his diet 2 weeks ago. He is determined to continue on his diet >> discussed the need to decrease Humalog doses as his sugars decrease further. - I advised him to:  Patient Instructions  Please continue: - Metformin 1000 mg with dinner - Toujeo 60 units at bedtime - Humalog 20-24 units before meals   Please return in 4 months with your sugar log.  - today, HbA1c is 6.3% (higher, but still very good) - continue checking sugars at different times of the day - check 3x a day, rotating checks - advised for yearly eye exams >> he is UTD - Return to clinic in 3 mo with sugar log   2. Obesity class 2 -She initially lost 20 pounds on a more plant-based diet, and gained  A net 10 since last visit.  He lost 5 pounds in the last week.  3. HL -Latest lipids from 02/2017 were reviewed: LDL improved further -  He is not on a statin as he is intolerant  Philemon Kingdom, MD PhD [facility]

## 2017-03-24 ENCOUNTER — Ambulatory Visit (INDEPENDENT_AMBULATORY_CARE_PROVIDER_SITE_OTHER): Payer: Medicare Other | Admitting: Orthopedic Surgery

## 2017-04-01 DIAGNOSIS — H0289 Other specified disorders of eyelid: Secondary | ICD-10-CM | POA: Diagnosis not present

## 2017-04-09 ENCOUNTER — Ambulatory Visit (INDEPENDENT_AMBULATORY_CARE_PROVIDER_SITE_OTHER): Payer: Medicare Other | Admitting: Orthopedic Surgery

## 2017-04-09 DIAGNOSIS — T8189XA Other complications of procedures, not elsewhere classified, initial encounter: Secondary | ICD-10-CM | POA: Diagnosis not present

## 2017-04-09 DIAGNOSIS — H0289 Other specified disorders of eyelid: Secondary | ICD-10-CM | POA: Diagnosis not present

## 2017-04-14 DIAGNOSIS — H0289 Other specified disorders of eyelid: Secondary | ICD-10-CM | POA: Diagnosis not present

## 2017-04-14 DIAGNOSIS — S01101D Unspecified open wound of right eyelid and periocular area, subsequent encounter: Secondary | ICD-10-CM | POA: Diagnosis not present

## 2017-04-16 ENCOUNTER — Ambulatory Visit (INDEPENDENT_AMBULATORY_CARE_PROVIDER_SITE_OTHER): Payer: Medicare Other | Admitting: Orthopedic Surgery

## 2017-04-16 DIAGNOSIS — I700 Atherosclerosis of aorta: Secondary | ICD-10-CM | POA: Diagnosis not present

## 2017-04-16 DIAGNOSIS — S01101A Unspecified open wound of right eyelid and periocular area, initial encounter: Secondary | ICD-10-CM | POA: Diagnosis not present

## 2017-04-16 DIAGNOSIS — T86828 Other complications of skin graft (allograft) (autograft): Secondary | ICD-10-CM | POA: Diagnosis not present

## 2017-04-16 DIAGNOSIS — Z951 Presence of aortocoronary bypass graft: Secondary | ICD-10-CM | POA: Diagnosis not present

## 2017-04-16 DIAGNOSIS — I96 Gangrene, not elsewhere classified: Secondary | ICD-10-CM | POA: Diagnosis not present

## 2017-04-21 ENCOUNTER — Other Ambulatory Visit: Payer: Self-pay | Admitting: Internal Medicine

## 2017-04-21 DIAGNOSIS — T86828 Other complications of skin graft (allograft) (autograft): Secondary | ICD-10-CM | POA: Diagnosis not present

## 2017-04-21 DIAGNOSIS — I96 Gangrene, not elsewhere classified: Secondary | ICD-10-CM | POA: Diagnosis not present

## 2017-04-22 DIAGNOSIS — I96 Gangrene, not elsewhere classified: Secondary | ICD-10-CM | POA: Diagnosis not present

## 2017-04-22 DIAGNOSIS — T86828 Other complications of skin graft (allograft) (autograft): Secondary | ICD-10-CM | POA: Diagnosis not present

## 2017-04-23 ENCOUNTER — Ambulatory Visit (INDEPENDENT_AMBULATORY_CARE_PROVIDER_SITE_OTHER): Payer: Medicare Other | Admitting: Orthopedic Surgery

## 2017-04-23 DIAGNOSIS — T86828 Other complications of skin graft (allograft) (autograft): Secondary | ICD-10-CM | POA: Diagnosis not present

## 2017-04-23 DIAGNOSIS — I96 Gangrene, not elsewhere classified: Secondary | ICD-10-CM | POA: Diagnosis not present

## 2017-04-24 DIAGNOSIS — I96 Gangrene, not elsewhere classified: Secondary | ICD-10-CM | POA: Diagnosis not present

## 2017-04-24 DIAGNOSIS — T86828 Other complications of skin graft (allograft) (autograft): Secondary | ICD-10-CM | POA: Diagnosis not present

## 2017-04-28 DIAGNOSIS — T86828 Other complications of skin graft (allograft) (autograft): Secondary | ICD-10-CM | POA: Diagnosis not present

## 2017-04-28 DIAGNOSIS — I96 Gangrene, not elsewhere classified: Secondary | ICD-10-CM | POA: Diagnosis not present

## 2017-04-29 DIAGNOSIS — I96 Gangrene, not elsewhere classified: Secondary | ICD-10-CM | POA: Diagnosis not present

## 2017-04-29 DIAGNOSIS — T86828 Other complications of skin graft (allograft) (autograft): Secondary | ICD-10-CM | POA: Diagnosis not present

## 2017-04-30 DIAGNOSIS — I96 Gangrene, not elsewhere classified: Secondary | ICD-10-CM | POA: Diagnosis not present

## 2017-04-30 DIAGNOSIS — T86828 Other complications of skin graft (allograft) (autograft): Secondary | ICD-10-CM | POA: Diagnosis not present

## 2017-04-30 DIAGNOSIS — H0289 Other specified disorders of eyelid: Secondary | ICD-10-CM | POA: Diagnosis not present

## 2017-05-01 DIAGNOSIS — T86828 Other complications of skin graft (allograft) (autograft): Secondary | ICD-10-CM | POA: Diagnosis not present

## 2017-05-01 DIAGNOSIS — I96 Gangrene, not elsewhere classified: Secondary | ICD-10-CM | POA: Diagnosis not present

## 2017-05-02 DIAGNOSIS — I96 Gangrene, not elsewhere classified: Secondary | ICD-10-CM | POA: Diagnosis not present

## 2017-05-02 DIAGNOSIS — T86828 Other complications of skin graft (allograft) (autograft): Secondary | ICD-10-CM | POA: Diagnosis not present

## 2017-05-05 ENCOUNTER — Ambulatory Visit (INDEPENDENT_AMBULATORY_CARE_PROVIDER_SITE_OTHER): Payer: Medicare Other | Admitting: Orthopedic Surgery

## 2017-05-05 ENCOUNTER — Encounter (INDEPENDENT_AMBULATORY_CARE_PROVIDER_SITE_OTHER): Payer: Self-pay | Admitting: Orthopedic Surgery

## 2017-05-05 DIAGNOSIS — I2510 Atherosclerotic heart disease of native coronary artery without angina pectoris: Secondary | ICD-10-CM | POA: Diagnosis not present

## 2017-05-05 DIAGNOSIS — Z89512 Acquired absence of left leg below knee: Secondary | ICD-10-CM | POA: Diagnosis not present

## 2017-05-05 DIAGNOSIS — T86828 Other complications of skin graft (allograft) (autograft): Secondary | ICD-10-CM | POA: Diagnosis not present

## 2017-05-05 DIAGNOSIS — I96 Gangrene, not elsewhere classified: Secondary | ICD-10-CM | POA: Diagnosis not present

## 2017-05-05 MED ORDER — PENTOXIFYLLINE ER 400 MG PO TBCR: 400.0000 mg | EXTENDED_RELEASE_TABLET | Freq: Three times a day (TID) | ORAL | 3 refills | Status: DC

## 2017-05-05 NOTE — Progress Notes (Signed)
Office Visit Note   Patient: [name]           Date of Birth: Jul 10, 1950           MRN: [id number] Visit Date: 05/05/2017              Requested by: [name], MD [address] [place], [place] [address] PCP: [name], MD  Chief Complaint  Patient presents with  . Left Leg - Follow-up      HPI: Patient is a 67 year old gentleman who presents in follow-up for a blister ulcer over the left residual limb.  Patient states that he initially was wearing the liner he had increased sweating and maceration and a large blister has been out of his leg now for a prolonged period of time.  Patient states he is currently going to hyperbaric oxygen therapy due to a nonhealing wound from his surgery x3.  Assessment & Plan: Visit Diagnoses:  1. Status post below knee amputation of left lower extremity ([place])     Plan: Recommend that he wear the stump shrinker at all times follow-up with biotech to have his prosthetic fitted and adjust.  Always wear the stump shrinker against the skin silicone liner on top suggested start wearing the prosthesis but not weightbearing reevaluation in 4 weeks to initiate weightbearing.  Follow-Up Instructions: Return in about 1 month (around 06/02/2017).   Ortho Exam  Patient is alert, oriented, no adenopathy, well-dressed, normal affect, normal respiratory effort. Examination the ulcer is healed his leg is cold he has good hair growth halfway down the tibia.  There is no redness no cellulitis no drainage no signs of infection.  Patient does have significant microcirculatory disease.  We will start him on [place] to see if this will improve his microcirculation.  He states he currently only takes an aspirin.  Imaging: No results found. No images are attached to the encounter.  Labs: Lab Results  Component Value Date   HGBA1C 6.3 03/06/2017   HGBA1C 5.7 11/05/2016   HGBA1C 7.3 05/13/2016   ESRSEDRATE 35 (H) 03/31/2013   CRP 5.3  (H) 03/31/2013   REPTSTATUS 11/03/2016 FINAL 11/01/2016   GRAMSTAIN  12/30/2012    FEW WBC PRESENT, PREDOMINANTLY PMN RARE SQUAMOUS EPITHELIAL CELLS PRESENT ABUNDANT GRAM NEGATIVE RODS MODERATE GRAM POSITIVE COCCI IN PAIRS IN CLUSTERS   CULT  11/01/2016    NO GROWTH Performed at [facility], [place] [address], [place], [place] [address]    [place] OXYTOCA 12/15/2012   LABORGA PSEUDOMONAS AERUGINOSA 12/15/2012    @LABSALLVALUES (HGBA1)@  There is no height or weight on file to calculate BMI.  Orders:  No orders of the defined types were placed in this encounter.  Meds ordered this encounter  Medications  . pentoxifylline (TRENTAL) 400 MG CR tablet    Sig: Take 1 tablet (400 mg total) by mouth 3 (three) times daily with meals.    Dispense:  90 tablet    Refill:  3     Procedures: No procedures performed  Clinical Data: No additional findings.  ROS:  All other systems negative, except as noted in the HPI. Review of Systems  Objective: Vital Signs: There were no vitals taken for this visit.  Specialty Comments:  No specialty comments available.  PMFS History: Patient Active Problem List   Diagnosis Date Noted  . PAD (peripheral artery disease) ([place]) 02/19/2017  . Chronic renal disease, stage III ([place]) 02/19/2017  . Morbid obesity (  [place]) 11/05/2016  . Ulcer of toe of right foot, limited to breakdown of skin ([place]) 10/11/2016  . Non-pressure chronic ulcer of left calf, limited to breakdown of skin ([place]) 06/04/2016  . Idiopathic chronic venous hypertension of right lower extremity with ulcer and inflammation ([place]) 06/04/2016  . Status post below knee amputation of left lower extremity ([place]) 02/23/2016  . Type 2 diabetes mellitus with diabetic polyneuropathy, with long-term current use of insulin ([place]) 03/29/2015  . Advance directive discussed with patient 02/07/2014  . Normocytic anemia 04/01/2013  . Diabetic Charcot foot ([place]) 04/01/2013  . Routine  general medical examination at a health care facility 12/25/2010  . HEMORRHOIDS-INTERNAL 11/22/2009  . PERSONAL HX COLONIC POLYPS 11/22/2009  . Venous (peripheral) insufficiency 06/29/2008  . Osteoarthritis, multiple sites 04/03/2007  . ERECTILE DYSFUNCTION, ORGANIC 06/13/2006  . Hyperlipemia 06/11/2006  . GLAUCOMA 06/11/2006  . Coronary atherosclerosis of native coronary artery 06/11/2006  . Obstructive sleep apnea 06/11/2006   Past Medical History:  Diagnosis Date  . Coronary atherosclerosis of unspecified type of vessel, native or graft   . Diabetes mellitus without complication ([place])   . Hemorrhage of rectum and anus   . History of kidney stones   . Impotence of organic origin   . Internal hemorrhoids without mention of complication   . Malignant neoplasm of prostate ([place])   . Mononeuritis of unspecified site   . Osteoarthrosis, unspecified whether generalized or localized, unspecified site   . Other and unspecified hyperlipidemia   . Personal history of colonic polyps   . Personal history of diabetic foot ulcer    saw wound center, resolved 05/08/2010  . Personal history of gallstones   . Routine general medical examination at a health care facility   . Type II or unspecified type diabetes mellitus with neurological manifestations, not stated as uncontrolled(250.60)   . Unspecified glaucoma(365.9)   . Unspecified sleep apnea    cpcp  . Unspecified venous (peripheral) insufficiency     Family History  Problem Relation Age of Onset  . Lung cancer Father   . Multiple sclerosis Mother   . Heart attack Unknown        paternal aunts and uncles  . Peripheral vascular disease Maternal Grandfather        several amputations    Past Surgical History:  Procedure Laterality Date  . AMPUTATION Left 12/30/2012   Procedure: AMPUTATION RAY ;  Surgeon: Meredith Pel, MD;  [facility];  Service: Orthopedics;  Laterality: Left;  LEFT GREAT TOE RAY AMPUTATION  . AMPUTATION  Left 02/23/2016   Procedure: Left Below Knee Amputation;  Surgeon: [name], MD;  Location: [facility];  Service: Orthopedics;  Laterality: Left;  . APPENDECTOMY    . BASAL CELL CARCINOMA EXCISION  2/16   left forearm  . CARDIAC CATHETERIZATION  1998   Negative  . CATARACT EXTRACTION Right 2017   then lid surgery  . CORONARY ARTERY BYPASS GRAFT  09/2005   Post op AFIB  . EYE SURGERY    . FOOT BONE EXCISION Left 11/03/2013   DR Estelle Skibicki   . I&D EXTREMITY Left 11/03/2013   Procedure: Left Foot Partial Bone Excision Cuboid and Medial Cuneiform, Wound Closures;  Surgeon: [name], MD;  Location: [facility];  Service: Orthopedics;  Laterality: Left;  . INSERTION PROSTATE RADIATION SEED  2009   RT and seeds for prostate cancer  . KIDNEY STONE SURGERY  04/1993  . RCA stents  04/2003   EF  55%  . RETINAL DETACHMENT SURGERY  2002-2003  . thrombosed vein  1993   Right leg   Social History   Occupational History  . Occupation: Heavy equipment mechanic--retired  Tobacco Use  . Smoking status: Former Smoker    Packs/day: 2.00    Years: 35.00    Pack years: 70.00    Types: Cigars, Cigarettes    Last attempt to quit: 01/07/2001    Years since quitting: 16.3  . Smokeless tobacco: Never Used  Substance and Sexual Activity  . Alcohol use: Yes    Comment: rare  . Drug use: No  . Sexual activity: Yes    Partners: Female

## 2017-05-06 DIAGNOSIS — I96 Gangrene, not elsewhere classified: Secondary | ICD-10-CM | POA: Diagnosis not present

## 2017-05-06 DIAGNOSIS — T86828 Other complications of skin graft (allograft) (autograft): Secondary | ICD-10-CM | POA: Diagnosis not present

## 2017-05-07 DIAGNOSIS — T86828 Other complications of skin graft (allograft) (autograft): Secondary | ICD-10-CM | POA: Diagnosis not present

## 2017-05-07 DIAGNOSIS — I96 Gangrene, not elsewhere classified: Secondary | ICD-10-CM | POA: Diagnosis not present

## 2017-05-08 DIAGNOSIS — T86828 Other complications of skin graft (allograft) (autograft): Secondary | ICD-10-CM | POA: Diagnosis not present

## 2017-05-08 DIAGNOSIS — I96 Gangrene, not elsewhere classified: Secondary | ICD-10-CM | POA: Diagnosis not present

## 2017-05-09 DIAGNOSIS — T86828 Other complications of skin graft (allograft) (autograft): Secondary | ICD-10-CM | POA: Diagnosis not present

## 2017-05-09 DIAGNOSIS — I96 Gangrene, not elsewhere classified: Secondary | ICD-10-CM | POA: Diagnosis not present

## 2017-05-12 DIAGNOSIS — T86828 Other complications of skin graft (allograft) (autograft): Secondary | ICD-10-CM | POA: Diagnosis not present

## 2017-05-12 DIAGNOSIS — I96 Gangrene, not elsewhere classified: Secondary | ICD-10-CM | POA: Diagnosis not present

## 2017-05-13 DIAGNOSIS — T86828 Other complications of skin graft (allograft) (autograft): Secondary | ICD-10-CM | POA: Diagnosis not present

## 2017-05-13 DIAGNOSIS — I96 Gangrene, not elsewhere classified: Secondary | ICD-10-CM | POA: Diagnosis not present

## 2017-05-14 DIAGNOSIS — T86828 Other complications of skin graft (allograft) (autograft): Secondary | ICD-10-CM | POA: Diagnosis not present

## 2017-05-14 DIAGNOSIS — I96 Gangrene, not elsewhere classified: Secondary | ICD-10-CM | POA: Diagnosis not present

## 2017-05-15 DIAGNOSIS — T86828 Other complications of skin graft (allograft) (autograft): Secondary | ICD-10-CM | POA: Diagnosis not present

## 2017-05-15 DIAGNOSIS — I96 Gangrene, not elsewhere classified: Secondary | ICD-10-CM | POA: Diagnosis not present

## 2017-05-16 DIAGNOSIS — I96 Gangrene, not elsewhere classified: Secondary | ICD-10-CM | POA: Diagnosis not present

## 2017-05-16 DIAGNOSIS — T86828 Other complications of skin graft (allograft) (autograft): Secondary | ICD-10-CM | POA: Diagnosis not present

## 2017-05-19 DIAGNOSIS — I96 Gangrene, not elsewhere classified: Secondary | ICD-10-CM | POA: Diagnosis not present

## 2017-05-19 DIAGNOSIS — T86828 Other complications of skin graft (allograft) (autograft): Secondary | ICD-10-CM | POA: Diagnosis not present

## 2017-05-27 ENCOUNTER — Other Ambulatory Visit: Payer: Self-pay | Admitting: Internal Medicine

## 2017-06-05 ENCOUNTER — Ambulatory Visit (INDEPENDENT_AMBULATORY_CARE_PROVIDER_SITE_OTHER): Payer: Medicare Other | Admitting: Orthopedic Surgery

## 2017-06-06 ENCOUNTER — Ambulatory Visit: Payer: Medicare Other | Admitting: Internal Medicine

## 2017-06-10 ENCOUNTER — Encounter (INDEPENDENT_AMBULATORY_CARE_PROVIDER_SITE_OTHER): Payer: Self-pay | Admitting: Orthopedic Surgery

## 2017-06-10 ENCOUNTER — Ambulatory Visit (INDEPENDENT_AMBULATORY_CARE_PROVIDER_SITE_OTHER): Payer: Medicare Other | Admitting: Orthopedic Surgery

## 2017-06-10 DIAGNOSIS — I2510 Atherosclerotic heart disease of native coronary artery without angina pectoris: Secondary | ICD-10-CM

## 2017-06-10 DIAGNOSIS — Z89512 Acquired absence of left leg below knee: Secondary | ICD-10-CM

## 2017-06-10 DIAGNOSIS — L97221 Non-pressure chronic ulcer of left calf limited to breakdown of skin: Secondary | ICD-10-CM

## 2017-06-10 NOTE — Progress Notes (Signed)
Office Visit Note   Patient: [name]           Date of Birth: 1950-04-10           MRN: [id number] Visit Date: 06/10/2017              Requested by: [name], MD [place], [place] [address] PCP: [name], MD  No chief complaint on file.     HPI: Patient is a 67 year old gentleman who presents in follow-up for N bearing ulcer left transtibial amputation.  Patient states he is using soap and water to clean the wound.  Patient states that when he used the [place] his glucometer did not read his glucose level.  Assessment & Plan: Visit Diagnoses:  1. Status post below knee amputation of left lower extremity ([place])   2. Non-pressure chronic ulcer of left calf, limited to breakdown of skin ([place])     Plan: Patient will use a half a nitroglycerin patch to help improve these and very ulcers on the left transtibial amputation he will continue wearing his medical compression stocking.  Patient states he is going to be out of town in [place] and will follow-up when he returns.  Follow-Up Instructions: Return in about 2 months (around 08/10/2017).   Ortho Exam  Patient is alert, oriented, no adenopathy, well-dressed, normal affect, normal respiratory effort. Examination patient has good hair growth on the residual limb he still has swelling.  There are 2 superficial and very ulcers that are about 5 mm in diameter 0.1 mm deep with no cellulitis no odor no drainage no signs of infection.  Imaging: No results found. No images are attached to the encounter.  Labs: Lab Results  Component Value Date   HGBA1C 6.3 03/06/2017   HGBA1C 5.7 11/05/2016   HGBA1C 7.3 05/13/2016   ESRSEDRATE 35 (H) 03/31/2013   CRP 5.3 (H) 03/31/2013   REPTSTATUS 11/03/2016 FINAL 11/01/2016   GRAMSTAIN  12/30/2012    FEW WBC PRESENT, PREDOMINANTLY PMN RARE SQUAMOUS EPITHELIAL CELLS PRESENT ABUNDANT GRAM NEGATIVE RODS MODERATE GRAM POSITIVE COCCI IN PAIRS IN  CLUSTERS   CULT  11/01/2016    NO GROWTH Performed at [facility], [place] [address], [place],  [address]    [place] OXYTOCA 12/15/2012   [place] PSEUDOMONAS AERUGINOSA 12/15/2012     Lab Results  Component Value Date   ALBUMIN 4.1 02/19/2017   ALBUMIN 4.4 11/01/2016   ALBUMIN 4.3 02/14/2016    There is no height or weight on file to calculate BMI.  Orders:  No orders of the defined types were placed in this encounter.  No orders of the defined types were placed in this encounter.    Procedures: No procedures performed  Clinical Data: No additional findings.  ROS:  All other systems negative, except as noted in the HPI. Review of Systems  Objective: Vital Signs: There were no vitals taken for this visit.  Specialty Comments:  No specialty comments available.  PMFS History: Patient Active Problem List   Diagnosis Date Noted  . PAD (peripheral artery disease) ([place]) 02/19/2017  . Chronic renal disease, stage III ([place]) 02/19/2017  . Morbid obesity ([place]) 11/05/2016  . Ulcer of toe of right foot, limited to breakdown of skin ([place]) 10/11/2016  . Non-pressure chronic ulcer of left calf, limited to breakdown of skin ([place]) 06/04/2016  . Idiopathic chronic venous hypertension of right lower extremity with ulcer and inflammation ([place]) 06/04/2016  .  Status post below knee amputation of left lower extremity ([place]) 02/23/2016  . Type 2 diabetes mellitus with diabetic polyneuropathy, with long-term current use of insulin ([place]) 03/29/2015  . Advance directive discussed with patient 02/07/2014  . Normocytic anemia 04/01/2013  . Diabetic Charcot foot ([place]) 04/01/2013  . Routine general medical examination at a health care facility 12/25/2010  . HEMORRHOIDS-INTERNAL 11/22/2009  . PERSONAL HX COLONIC POLYPS 11/22/2009  . Venous (peripheral) insufficiency 06/29/2008  . Osteoarthritis, multiple sites 04/03/2007  . ERECTILE DYSFUNCTION, ORGANIC 06/13/2006  .  Hyperlipemia 06/11/2006  . GLAUCOMA 06/11/2006  . Coronary atherosclerosis of native coronary artery 06/11/2006  . Obstructive sleep apnea 06/11/2006   Past Medical History:  Diagnosis Date  . Coronary atherosclerosis of unspecified type of vessel, native or graft   . Diabetes mellitus without complication ([place])   . Hemorrhage of rectum and anus   . History of kidney stones   . Impotence of organic origin   . Internal hemorrhoids without mention of complication   . Malignant neoplasm of prostate ([place])   . Mononeuritis of unspecified site   . Osteoarthrosis, unspecified whether generalized or localized, unspecified site   . Other and unspecified hyperlipidemia   . Personal history of colonic polyps   . Personal history of diabetic foot ulcer    saw wound center, resolved 05/08/2010  . Personal history of gallstones   . Routine general medical examination at a health care facility   . Type II or unspecified type diabetes mellitus with neurological manifestations, not stated as uncontrolled(250.60)   . Unspecified glaucoma(365.9)   . Unspecified sleep apnea    cpcp  . Unspecified venous (peripheral) insufficiency     Family History  Problem Relation Age of Onset  . Lung cancer Father   . Multiple sclerosis Mother   . Heart attack Unknown        paternal aunts and uncles  . Peripheral vascular disease Maternal Grandfather        several amputations    Past Surgical History:  Procedure Laterality Date  . AMPUTATION Left 12/30/2012   Procedure: AMPUTATION RAY ;  Surgeon: Meredith Pel, MD;  Location: [facility];  Service: Orthopedics;  Laterality: Left;  LEFT GREAT TOE RAY AMPUTATION  . AMPUTATION Left 02/23/2016   Procedure: Left Below Knee Amputation;  Surgeon: [name], MD;  Location: [facility];  Service: Orthopedics;  Laterality: Left;  . APPENDECTOMY    . BASAL CELL CARCINOMA EXCISION  2/16   left forearm  . CARDIAC CATHETERIZATION  1998   Negative  . CATARACT EXTRACTION  Right 2017   then lid surgery  . CORONARY ARTERY BYPASS GRAFT  09/2005   Post op AFIB  . EYE SURGERY    . FOOT BONE EXCISION Left 11/03/2013   DR Pearl Bents   . I&D EXTREMITY Left 11/03/2013   Procedure: Left Foot Partial Bone Excision Cuboid and Medial Cuneiform, Wound Closures;  Surgeon: [name], MD;  Location: [facility];  Service: Orthopedics;  Laterality: Left;  . INSERTION PROSTATE RADIATION SEED  2009   RT and seeds for prostate cancer  . KIDNEY STONE SURGERY  04/1993  . RCA stents  04/2003   EF 55%  . RETINAL DETACHMENT SURGERY  2002-2003  . thrombosed vein  1993   Right leg   Social History   Occupational History  . Occupation: Heavy equipment mechanic--retired  Tobacco Use  . Smoking status: Former Smoker    Packs/day: 2.00    Years:  35.00    Pack years: 70.00    Types: Cigars, Cigarettes    Last attempt to quit: 01/07/2001    Years since quitting: 16.4  . Smokeless tobacco: Never Used  Substance and Sexual Activity  . Alcohol use: Yes    Comment: rare  . Drug use: No  . Sexual activity: Yes    Partners: Female

## 2017-06-11 ENCOUNTER — Ambulatory Visit (INDEPENDENT_AMBULATORY_CARE_PROVIDER_SITE_OTHER): Payer: Medicare Other | Admitting: Orthopedic Surgery

## 2017-07-02 DIAGNOSIS — H02211 Cicatricial lagophthalmos right upper eyelid: Secondary | ICD-10-CM | POA: Diagnosis not present

## 2017-07-04 ENCOUNTER — Other Ambulatory Visit: Payer: Self-pay | Admitting: Internal Medicine

## 2017-07-18 DIAGNOSIS — R339 Retention of urine, unspecified: Secondary | ICD-10-CM | POA: Diagnosis not present

## 2017-07-18 DIAGNOSIS — Z87442 Personal history of urinary calculi: Secondary | ICD-10-CM | POA: Diagnosis not present

## 2017-07-18 DIAGNOSIS — C61 Malignant neoplasm of prostate: Secondary | ICD-10-CM | POA: Diagnosis not present

## 2017-08-05 ENCOUNTER — Ambulatory Visit (INDEPENDENT_AMBULATORY_CARE_PROVIDER_SITE_OTHER): Payer: Medicare Other | Admitting: Internal Medicine

## 2017-08-05 ENCOUNTER — Encounter: Payer: Self-pay | Admitting: Internal Medicine

## 2017-08-05 VITALS — BP 118/84 | HR 89 | Ht 74.0 in | Wt 290.0 lb

## 2017-08-05 DIAGNOSIS — E782 Mixed hyperlipidemia: Secondary | ICD-10-CM | POA: Diagnosis not present

## 2017-08-05 DIAGNOSIS — I2510 Atherosclerotic heart disease of native coronary artery without angina pectoris: Secondary | ICD-10-CM | POA: Diagnosis not present

## 2017-08-05 DIAGNOSIS — Z794 Long term (current) use of insulin: Secondary | ICD-10-CM | POA: Diagnosis not present

## 2017-08-05 DIAGNOSIS — E1142 Type 2 diabetes mellitus with diabetic polyneuropathy: Secondary | ICD-10-CM | POA: Diagnosis not present

## 2017-08-05 LAB — POCT GLYCOSYLATED HEMOGLOBIN (HGB A1C): HEMOGLOBIN A1C: 7.3 % — AB (ref 4.0–5.6)

## 2017-08-05 NOTE — Patient Instructions (Addendum)
Please continue: - Metformin 1000 mg with dinner - Toujeo 60 units at bedtime - Humalog 20-24 units before meals  May increase Toujeo to 70 units if sugars do not improve after you start your diet.  Please return in 4 months with your sugar log.

## 2017-08-05 NOTE — Addendum Note (Signed)
Addended by: Drucilla Schmidt on: 08/05/2017 02:11 PM   Modules accepted: Orders

## 2017-08-05 NOTE — Progress Notes (Signed)
Patient ID: [name], male   DOB: September 10, 1950, 67 y.o.   MRN: [id number]  HPI: [name] is a 67 y.o.-year-old male, returning for f/u for DM2, dx 2008, insulin-dependent since dx, uncontrolled, with complications (peripheral neuropathy, CAD-status post CABG in 2008, PVD, Charcot foot, history of diabetic foot ulcer, history of retinal detachment in 2002-3). Last visit 5 mo ago. He is here with his wife who offers part of the hx, especially regarding his diet and CBGs. 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 Also Part D - [employer].   He has skin flap necrosis developed since last visit.  He had 3 eyelid surgeries >> had to have hyperbaric treatments >> sugars started to drop during treatments >> started to decrease the insulin doses but not enough >> started to eat more.  He gained weight and his sugars are worse.  Last HbA1c was slightly higher, but still excellent: Lab Results  Component Value Date   HGBA1C 6.3 03/06/2017   HGBA1C 5.7 11/05/2016   HGBA1C 7.3 05/13/2016   Pt is on a regimen of: - Metformin 1000 mg with dinner - Toujeo 60 units at bedtime - Humalog 20-24 units before meals  Pt checks his sugars 1-3x a day: - am: 106-147, 160 (candy), 180 (grapes) >> 150-190 - before lunch: 69, 104-154, 207 >> n/c >> 56 >> n/c - before dinner: 126, 141-184, 211 >> 62-132 >> 109-161 >> n/c - bedtime:   68-109, 136, 254, 293 >> 96-160 >> n/c Lowest: 56 x1 (lunchtime) >> 96 >> 110. Hypogly awareness at 100.  Highest sugar: 293 (pain) >> 180 >> 200.  Patient meals are: - Breakfast: bacon, eggs, tomato sandwich (sometimes no bread) >> coffee + fruit - Lunch: PB crackers >> baked fish or chicken + veggies - Dinner: meat + 2 veggies  >> same as lunch - Snacks: 1 a day - 2x a week, icecream  >> fruit Uses Splenda in tea and coffee.  - He has CKD, last BUN/creatinine:  Lab Results  Component Value Date   BUN 18 02/19/2017   CREATININE 1.11 02/19/2017  On  losartan. - + HL; last set of lipids: Lab Results  Component Value Date   CHOL 107 02/19/2017   HDL 26.40 (L) 02/19/2017   LDLCALC 52 02/19/2017   LDLDIRECT 72.0 02/13/2015   TRIG 143.0 02/19/2017   CHOLHDL 4 02/19/2017  He is statin intolerant - last eye exam was 01/2017: No DR .  Dr. Claudean Kinds.  He had a detached retina in 03/2015 >> had Laser sx (his 3rd). He had cataract sx (R) 06/2015. - No sensation in his feet.  He had amputation of his big toe in 12/30/2012 2/2 infection >> osteomyelitis. He continued to have problems with ulcers on his left foot, in the setting of Charcot joint. He had a L BKA (02/23/2016) b/c he had a long h/o ulcer on his Charcot foot >> developed osteomyelitis. He now has his prosthesis off ans he developed a blister on his stump.  He also has a history of prostate cancer , s/p 25 RxTx, then radioactive seeds. Also, HTN, OSA, OA.  ROS: Constitutional: + weight gain/no weight loss, no fatigue, no subjective hyperthermia, no subjective hypothermia Eyes: no blurry vision, no xerophthalmia ENT: no sore throat, no nodules palpated in throat, no dysphagia, no odynophagia, no hoarseness Cardiovascular: no CP/no SOB/no palpitations/no leg swelling Respiratory: no cough/no SOB/no wheezing Gastrointestinal: no N/no V/no D/no C/no acid reflux Musculoskeletal: no muscle  aches/no joint aches Skin: no rashes, no hair loss Neurological: + tremors/no numbness/no tingling/no dizziness  I reviewed pt's medications, allergies, PMH, social hx, family hx, and changes were documented in the history of present illness. Otherwise, unchanged from my initial visit note.  Past Medical History:  Diagnosis Date  . Coronary atherosclerosis of unspecified type of vessel, native or graft   . Diabetes mellitus without complication ([place])   . Hemorrhage of rectum and anus   . History of kidney stones   . Impotence of organic origin   . Internal hemorrhoids without mention of  complication   . Malignant neoplasm of prostate ([place])   . Mononeuritis of unspecified site   . Osteoarthrosis, unspecified whether generalized or localized, unspecified site   . Other and unspecified hyperlipidemia   . Personal history of colonic polyps   . Personal history of diabetic foot ulcer    saw wound center, resolved 05/08/2010  . Personal history of gallstones   . Routine general medical examination at a health care facility   . Type II or unspecified type diabetes mellitus with neurological manifestations, not stated as uncontrolled(250.60)   . Unspecified glaucoma(365.9)   . Unspecified sleep apnea    cpcp  . Unspecified venous (peripheral) insufficiency    Past Surgical History:  Procedure Laterality Date  . AMPUTATION Left 12/30/2012   Procedure: AMPUTATION RAY ;  Surgeon: Meredith Pel, MD;  [facility];  Service: Orthopedics;  Laterality: Left;  LEFT GREAT TOE RAY AMPUTATION  . AMPUTATION Left 02/23/2016   Procedure: Left Below Knee Amputation;  Surgeon: [name], MD;  Location: [facility];  Service: Orthopedics;  Laterality: Left;  . APPENDECTOMY    . BASAL CELL CARCINOMA EXCISION  2/16   left forearm  . CARDIAC CATHETERIZATION  1998   Negative  . CATARACT EXTRACTION Right 2017   then lid surgery  . CORONARY ARTERY BYPASS GRAFT  09/2005   Post op AFIB  . EYE SURGERY    . FOOT BONE EXCISION Left 11/03/2013   DR DUDA   . I&D EXTREMITY Left 11/03/2013   Procedure: Left Foot Partial Bone Excision Cuboid and Medial Cuneiform, Wound Closures;  Surgeon: [name], MD;  Location: [facility];  Service: Orthopedics;  Laterality: Left;  . INSERTION PROSTATE RADIATION SEED  2009   RT and seeds for prostate cancer  . KIDNEY STONE SURGERY  04/1993  . RCA stents  04/2003   EF 55%  . RETINAL DETACHMENT SURGERY  2002-2003  . thrombosed vein  1993   Right leg   Social History   Socioeconomic History  . Marital status: Widowed    Spouse name: Not on file  . Number  of children: 3  . Years of education: Not on file  . Highest education level: Not on file  Occupational History  . Occupation: Heavy equipment mechanic--retired  Social Needs  . Financial resource strain: Not on file  . Food insecurity:    Worry: Not on file    Inability: Not on file  . Transportation needs:    Medical: Not on file    Non-medical: Not on file  Tobacco Use  . Smoking status: Former Smoker    Packs/day: 2.00    Years: 35.00    Pack years: 70.00    Types: Cigars, Cigarettes    Last attempt to quit: 01/07/2001    Years since quitting: 16.5  . Smokeless tobacco: Never Used  Substance and Sexual Activity  .  Alcohol use: Yes    Comment: rare  . Drug use: No  . Sexual activity: Yes    Partners: Female  Lifestyle  . Physical activity:    Days per week: Not on file    Minutes per session: Not on file  . Stress: Not on file  Relationships  . Social connections:    Talks on phone: Not on file    Gets together: Not on file    Attends religious service: Not on file    Active member of club or organization: Not on file    Attends meetings of clubs or organizations: Not on file    Relationship status: Not on file  . Intimate partner violence:    Fear of current or ex partner: Not on file    Emotionally abused: Not on file    Physically abused: Not on file    Forced sexual activity: Not on file  Other Topics Concern  . Not on file  Social History Narrative   Has living will   Daughter [name] is [facility] health care POA    Would accept resuscitation attempts--but no prolonged artificial ventilation   No tube feeds if cognitively unaware   Current Outpatient Medications on File Prior to Visit  Medication Sig Dispense Refill  . ACCU-CHEK FASTCLIX LANCETS MISC Use to check sugar 3 times daily dx- E11.42 300 each 5  . aspirin 325 MG tablet Take 325 mg by mouth daily.      . B-D UF III MINI PEN NEEDLES 31G X 5 MM MISC USE TO INJECT INSULIN FOUR TIMES DAILY AS  INSTRUCTED (FOR DIABETES) 360 each 2  . Blood Glucose Monitoring Suppl (ACCU-CHEK GUIDE) w/Device KIT 1 kit by Other route 3 (three) times daily. Use to check blood sugars 3 times daily, Dx Code E11.42 1 kit 1  . carvedilol (COREG) 25 MG tablet TAKE 1 TABLET TWICE DAILY 180 tablet 3  . glucose blood (ACCU-CHEK GUIDE) test strip Use as instructed to check sugar 3 times daily dx- E11.42 300 each 5  . Insulin Glargine (TOUJEO SOLOSTAR) 300 UNIT/ML SOPN Inject 60 Units into the skin daily. 18 pen 3  . losartan-hydrochlorothiazide (HYZAAR) 50-12.5 MG tablet TAKE 1 TABLET EVERY DAY 90 tablet 3  . metFORMIN (GLUCOPHAGE) 1000 MG tablet Take 1 tablet (1,000 mg total) by mouth daily. 90 tablet 3  . mupirocin ointment (BACTROBAN) 2 % Apply to wound daily (Patient taking differently: Apply 1 application topically daily as needed (wound healing). Apply to wound daily) 22 g 0  . naproxen sodium (ANAPROX) 220 MG tablet Take 440 mg by mouth 2 (two) times daily as needed (for pain.). For pain    . nitroGLYCERIN (NITRODUR - DOSED IN MG/24 HR) 0.2 mg/hr patch Place 1 patch (0.2 mg total) daily onto the skin. 30 patch 12  . NOVOLOG FLEXPEN 100 UNIT/ML FlexPen INJECT  30  TO  35 UNITS SUBCUTANEOUSLY THREE TIMES DAILY WITH MEALS (Patient taking differently: INJECT  24  TO  30 UNITS SUBCUTANEOUSLY THREE TIMES DAILY WITH MEALS) 90 mL 1  . pentoxifylline (TRENTAL) 400 MG CR tablet Take 1 tablet (400 mg total) by mouth 3 (three) times daily with meals. 90 tablet 3  . potassium citrate (UROCIT-K) 10 MEQ (1080 MG) SR tablet Take 20 mEq by mouth 2 (two) times daily.   12  . Probiotic Product (PROBIOTIC DAILY PO) Take 1 capsule by mouth daily.    . silver sulfADIAZINE (SILVADENE) 1 % cream Apply 1  application topically daily. 50 g 0  . triamcinolone (KENALOG) 0.025 % cream APPLY 1 APPLICATION TOPICALLY 3 (THREE) TIMES DAILY AS NEEDED. (Patient taking differently: APPLY 1 APPLICATION TOPICALLY 3 (THREE) TIMES DAILY AS NEEDED FOR  DRY SKIN AND RASH) 30 g 3   No current facility-administered medications on file prior to visit.    Allergies  Allergen Reactions  . Atorvastatin Other (See Comments)    REACTION: myalgias  . Ezetimibe Other (See Comments)    Body ache  . Simvastatin Other (See Comments)    Body ache   Family History  Problem Relation Age of Onset  . Lung cancer Father   . Multiple sclerosis Mother   . Heart attack Unknown        paternal aunts and uncles  . Peripheral vascular disease Maternal Grandfather        several amputations    PE: BP 118/84   Pulse 89   Ht '6\' 2"'$  (1.88 m)   Wt 290 lb (131.5 kg)   SpO2 94%   BMI 37.23 kg/m  Body mass index is 37.23 kg/m.   Wt Readings from Last 3 Encounters:  08/05/17 290 lb (131.5 kg)  03/06/17 281 lb (127.5 kg)  02/24/17 286 lb (129.7 kg)   Constitutional: overweight, in NAD Eyes: PERRLA, EOMI, no exophthalmos ENT: moist mucous membranes, no thyromegaly, no cervical lymphadenopathy Cardiovascular: RRR, No MRG, + R BKA with R LE edema Respiratory: CTA B Gastrointestinal: abdomen soft, NT, ND, BS+ Musculoskeletal: no deformities, strength intact in all 4 Skin: moist, warm, no rashes Neurological: + L tremor with outstretched hands, DTR normal in all 4   ASSESSMENT: 1. DM2, insulin-dependent, uncontrolled, with complications - peripheral neuropathy - CAD-status post stent in 2005, then 5v CABG in 2008 - PVD - history of diabetic foot ulcer - Charcot foot - L - h/o amputation of his big toe in 12/2012 2/2 osteomyelitis and L BKA 01/2016 - history of retinal detachment in 2002-3  2. Obesity class 2 BMI Classification:  < 18.5 underweight   18.5-24.9 normal weight   25.0-29.9 overweight   30.0-34.9 class I obesity   35.0-39.9 class II obesity   ? 40.0 class III obesity   3. HL  PLAN:  1. Patient with previously uncontrolled type 2 diabetes, on basal/bolus insulin regimen and metformin, with better control after he  started to eat a more plant-based diet.  He lost 20 pounds initially but gained 10 back during the holidays.  His diabetes control improved initially, but they increase during the holidays.  However, 2 weeks before last visit, he restarted his plant-based diet.  He was determined to continue.  His HbA1c was very good, at 6.3%. - Since last visit, he started to relax his diet at the time of his hyperbaric treatments, and sugars are now higher and he also gained weight.  However, now that his eyelid wound has healed, he can resume his previous diet, which he is prepared to do.  He would want does not to change his regimen at this visit a so that he can continue to work on his diet.  We discussed that if his sugars do not decrease after improving his diet, he can increase Toujeo to 70 units - I advised him to:  Patient Instructions  Please continue: - Metformin 1000 mg with dinner - Toujeo 60 units at bedtime - Humalog 20-24 units before meals  May increase Toujeo to 70 units if sugars do not improve after you start  your diet.  Please return in 4 months with your sugar log.   - today, HbA1c is 7.3% (higher) - continue checking sugars at different times of the day - check 3x a day, rotating checks - advised for yearly eye exams >> he is UTD - Return to clinic in 4 mo with sugar log   2. Obesity class 2 - He initially lost 20 pounds on a more plant-based diet >> he gained weight since last visit (9 pounds), however, he is determined to get back on his diet  3. HL - Reviewed latest lipid panel from 02/2017: LDL improved further Lab Results  Component Value Date   CHOL 107 02/19/2017   HDL 26.40 (L) 02/19/2017   LDLCALC 52 02/19/2017   LDLDIRECT 72.0 02/13/2015   TRIG 143.0 02/19/2017   CHOLHDL 4 02/19/2017  - He is intolerant to statins  Philemon Kingdom, MD PhD [facility]

## 2017-08-07 ENCOUNTER — Ambulatory Visit (INDEPENDENT_AMBULATORY_CARE_PROVIDER_SITE_OTHER): Payer: Medicare Other | Admitting: Orthopedic Surgery

## 2017-08-07 DIAGNOSIS — L97919 Non-pressure chronic ulcer of unspecified part of right lower leg with unspecified severity: Secondary | ICD-10-CM

## 2017-08-07 DIAGNOSIS — I87331 Chronic venous hypertension (idiopathic) with ulcer and inflammation of right lower extremity: Secondary | ICD-10-CM | POA: Diagnosis not present

## 2017-08-07 DIAGNOSIS — I2510 Atherosclerotic heart disease of native coronary artery without angina pectoris: Secondary | ICD-10-CM | POA: Diagnosis not present

## 2017-08-07 DIAGNOSIS — Z89512 Acquired absence of left leg below knee: Secondary | ICD-10-CM

## 2017-08-07 MED ORDER — DOXYCYCLINE HYCLATE 100 MG PO TABS: 100.0000 mg | ORAL_TABLET | Freq: Two times a day (BID) | ORAL | 0 refills | Status: DC

## 2017-08-10 ENCOUNTER — Encounter (INDEPENDENT_AMBULATORY_CARE_PROVIDER_SITE_OTHER): Payer: Self-pay | Admitting: Orthopedic Surgery

## 2017-08-10 NOTE — Progress Notes (Signed)
Office Visit Note   Patient: [name]           Date of Birth: [birth year]           MRN: [id number] Visit Date: 08/07/2017              Requested by: [name], MD [place], [place] [address] PCP: [name], MD  Chief Complaint  Patient presents with  . Left Leg - Routine Post Op      HPI: Patient is a 67 year old gentleman who presents with an acute abrasion to his left transtibial amputation.  He states that he was doing well he fell off his kneeling scooter feels like either from the fall or from try to get up from the ground he sustained the abrasion to the transtibial amputation on the left.  Assessment & Plan: Visit Diagnoses:  1. Status post below knee amputation of left lower extremity ([place])   2. Idiopathic chronic venous hypertension of right lower extremity with ulcer and inflammation (HCC)     Plan: Patient will resume using his medical compression stocking over the abrasion on the left transtibial amputation he will not use his prosthesis.  A prescription was called in for doxycycline recommended probiotics.  Follow-Up Instructions: Return in about 2 weeks (around 08/21/2017).   Ortho Exam  Patient is alert, oriented, no adenopathy, well-dressed, normal affect, normal respiratory effort. Examination patient is ambulating in a wheelchair.  He has good dorsalis pedis pulse on the right.  His right calf measures 43 cm in circumference and he will wear the extra-large compression stocking.  Examination of the left leg he has a large abrasion over the residual limb with fibrinous exudative tissue over the wound.  There is redness there is no odor is clear drainage.  Imaging: No results found.   Labs: Lab Results  Component Value Date   HGBA1C 7.3 (A) 08/05/2017   HGBA1C 6.3 03/06/2017   HGBA1C 5.7 11/05/2016   ESRSEDRATE 35 (H) 03/31/2013   CRP 5.3 (H) 03/31/2013   REPTSTATUS 11/03/2016 FINAL 11/01/2016   GRAMSTAIN   12/30/2012    FEW WBC PRESENT, PREDOMINANTLY PMN RARE SQUAMOUS EPITHELIAL CELLS PRESENT ABUNDANT GRAM NEGATIVE RODS MODERATE GRAM POSITIVE COCCI IN PAIRS IN CLUSTERS   CULT  11/01/2016    NO GROWTH Performed at [facility], [place] [address] [place], [place] [address]    [place] OXYTOCA 12/15/2012   LABORGA PSEUDOMONAS AERUGINOSA 12/15/2012     Lab Results  Component Value Date   ALBUMIN 4.1 02/19/2017   ALBUMIN 4.4 11/01/2016   ALBUMIN 4.3 02/14/2016    There is no height or weight on file to calculate BMI.  Orders:  No orders of the defined types were placed in this encounter.  Meds ordered this encounter  Medications  . doxycycline (VIBRA-TABS) 100 MG tablet    Sig: Take 1 tablet (100 mg total) by mouth 2 (two) times daily.    Dispense:  30 tablet    Refill:  0     Procedures: No procedures performed  Clinical Data: No additional findings.  ROS:  All other systems negative, except as noted in the HPI. Review of Systems  Objective: Vital Signs: There were no vitals taken for this visit.  Specialty Comments:  No specialty comments available.  PMFS History: Patient Active Problem List   Diagnosis Date Noted  . PAD (peripheral artery disease) ([place]) 02/19/2017  . Chronic renal disease, stage  III ([place]) 02/19/2017  . Morbid obesity ([place]) 11/05/2016  . Ulcer of toe of right foot, limited to breakdown of skin ([place]) 10/11/2016  . Non-pressure chronic ulcer of left calf, limited to breakdown of skin ([place]) 06/04/2016  . Idiopathic chronic venous hypertension of right lower extremity with ulcer and inflammation ([place]) 06/04/2016  . Status post below knee amputation of left lower extremity ([place]) 02/23/2016  . Type 2 diabetes mellitus with diabetic polyneuropathy, with long-term current use of insulin ([place]) 03/29/2015  . Advance directive discussed with patient 02/07/2014  . Normocytic anemia 04/01/2013  . Diabetic Charcot foot ([place]) 04/01/2013  .  Routine general medical examination at a health care facility 12/25/2010  . HEMORRHOIDS-INTERNAL 11/22/2009  . PERSONAL HX COLONIC POLYPS 11/22/2009  . Venous (peripheral) insufficiency 06/29/2008  . Osteoarthritis, multiple sites 04/03/2007  . ERECTILE DYSFUNCTION, ORGANIC 06/13/2006  . Hyperlipemia 06/11/2006  . GLAUCOMA 06/11/2006  . Coronary atherosclerosis of native coronary artery 06/11/2006  . Obstructive sleep apnea 06/11/2006   Past Medical History:  Diagnosis Date  . Coronary atherosclerosis of unspecified type of vessel, native or graft   . Diabetes mellitus without complication ([place])   . Hemorrhage of rectum and anus   . History of kidney stones   . Impotence of organic origin   . Internal hemorrhoids without mention of complication   . Malignant neoplasm of prostate ([place])   . Mononeuritis of unspecified site   . Osteoarthrosis, unspecified whether generalized or localized, unspecified site   . Other and unspecified hyperlipidemia   . Personal history of colonic polyps   . Personal history of diabetic foot ulcer    saw wound center, resolved 05/08/2010  . Personal history of gallstones   . Routine general medical examination at a health care facility   . Type II or unspecified type diabetes mellitus with neurological manifestations, not stated as uncontrolled(250.60)   . Unspecified glaucoma(365.9)   . Unspecified sleep apnea    cpcp  . Unspecified venous (peripheral) insufficiency     Family History  Problem Relation Age of Onset  . Lung cancer Father   . Multiple sclerosis Mother   . Heart attack Unknown        paternal aunts and uncles  . Peripheral vascular disease Maternal Grandfather        several amputations    Past Surgical History:  Procedure Laterality Date  . AMPUTATION Left 12/30/2012   Procedure: AMPUTATION RAY ;  Surgeon: Meredith Pel, MD;  Location: [facility];  Service: Orthopedics;  Laterality: Left;  LEFT GREAT TOE RAY AMPUTATION  .  AMPUTATION Left 02/23/2016   Procedure: Left Below Knee Amputation;  Surgeon: [name], MD;  Location: [facility];  Service: Orthopedics;  Laterality: Left;  . APPENDECTOMY    . BASAL CELL CARCINOMA EXCISION  2/16   left forearm  . CARDIAC CATHETERIZATION  1998   Negative  . CATARACT EXTRACTION Right 2017   then lid surgery  . CORONARY ARTERY BYPASS GRAFT  09/2005   Post op AFIB  . EYE SURGERY    . FOOT BONE EXCISION Left 11/03/2013   DR Mckenzie Toruno   . I&D EXTREMITY Left 11/03/2013   Procedure: Left Foot Partial Bone Excision Cuboid and Medial Cuneiform, Wound Closures;  Surgeon: [name], MD;  Location: [facility];  Service: Orthopedics;  Laterality: Left;  . INSERTION PROSTATE RADIATION SEED  2009   RT and seeds for prostate cancer  . KIDNEY STONE SURGERY  04/1993  .  RCA stents  04/2003   EF 55%  . RETINAL DETACHMENT SURGERY  2002-2003  . thrombosed vein  1993   Right leg   Social History   Occupational History  . Occupation: Heavy equipment mechanic--retired  Tobacco Use  . Smoking status: Former Smoker    Packs/day: 2.00    Years: 35.00    Pack years: 70.00    Types: Cigars, Cigarettes    Last attempt to quit: 01/07/2001    Years since quitting: 16.6  . Smokeless tobacco: Never Used  Substance and Sexual Activity  . Alcohol use: Yes    Comment: rare  . Drug use: No  . Sexual activity: Yes    Partners: Female

## 2017-08-21 ENCOUNTER — Ambulatory Visit (INDEPENDENT_AMBULATORY_CARE_PROVIDER_SITE_OTHER): Payer: Medicare Other | Admitting: Orthopedic Surgery

## 2017-08-21 ENCOUNTER — Encounter (INDEPENDENT_AMBULATORY_CARE_PROVIDER_SITE_OTHER): Payer: Self-pay | Admitting: Orthopedic Surgery

## 2017-08-21 DIAGNOSIS — I87331 Chronic venous hypertension (idiopathic) with ulcer and inflammation of right lower extremity: Secondary | ICD-10-CM

## 2017-08-21 DIAGNOSIS — L97919 Non-pressure chronic ulcer of unspecified part of right lower leg with unspecified severity: Secondary | ICD-10-CM | POA: Diagnosis not present

## 2017-08-21 DIAGNOSIS — Z89512 Acquired absence of left leg below knee: Secondary | ICD-10-CM | POA: Diagnosis not present

## 2017-08-21 DIAGNOSIS — L97221 Non-pressure chronic ulcer of left calf limited to breakdown of skin: Secondary | ICD-10-CM | POA: Diagnosis not present

## 2017-08-21 DIAGNOSIS — I2510 Atherosclerotic heart disease of native coronary artery without angina pectoris: Secondary | ICD-10-CM | POA: Diagnosis not present

## 2017-08-21 MED ORDER — SULFAMETHOXAZOLE-TRIMETHOPRIM 800-160 MG PO TABS: 1.0000 | ORAL_TABLET | Freq: Two times a day (BID) | ORAL | 0 refills | Status: DC

## 2017-08-21 NOTE — Progress Notes (Signed)
Office Visit Note   Patient: [name]           Date of Birth: 12-04-1950           MRN: [id number] Visit Date: 08/21/2017              Requested by: [name], MD [place], [place] [address] PCP: [name], MD  Chief Complaint  Patient presents with  . Left Leg - Follow-up      HPI: Patient is a 67 year old gentleman left transtibial amputation he fell sustained a blister he has been undergoing compression wraps he has been on doxycycline.  Assessment & Plan: Visit Diagnoses: No diagnosis found.  Plan: We will change him to Bactrim DS he will discontinue the doxycycline he is still on his probiotics he has compression stockings for the right lower extremity we will probably the probiotics 4 x 4 Ace wrap and the stump shrinker on the left.  Patient will use Silvadene plus dry dressing Ace wrap and stump shrinker once he is at home.  Reevaluate in 1 week.  Follow-Up Instructions: No follow-ups on file.   Ortho Exam  Patient is alert, oriented, no adenopathy, well-dressed, normal affect, normal respiratory effort. Examination patient's wound is more tender than it was previously concern for possible breakthrough infection and will change from Doxy to Bactrim DS.  The wound has good epithelialization around the wound edges there is still some swelling there is some dry flaky skin.  No ascending cellulitis no odor no drainage  Imaging: No results found. No images are attached to the encounter.  Labs: Lab Results  Component Value Date   HGBA1C 7.3 (A) 08/05/2017   HGBA1C 6.3 03/06/2017   HGBA1C 5.7 11/05/2016   ESRSEDRATE 35 (H) 03/31/2013   CRP 5.3 (H) 03/31/2013   REPTSTATUS 11/03/2016 FINAL 11/01/2016   GRAMSTAIN  12/30/2012    FEW WBC PRESENT, PREDOMINANTLY PMN RARE SQUAMOUS EPITHELIAL CELLS PRESENT ABUNDANT GRAM NEGATIVE RODS MODERATE GRAM POSITIVE COCCI IN PAIRS IN CLUSTERS   CULT  11/01/2016    NO GROWTH Performed at  [facility], [place] [address] [place], [place] [address]    [place] OXYTOCA 12/15/2012   LABORGA PSEUDOMONAS AERUGINOSA 12/15/2012     Lab Results  Component Value Date   ALBUMIN 4.1 02/19/2017   ALBUMIN 4.4 11/01/2016   ALBUMIN 4.3 02/14/2016    There is no height or weight on file to calculate BMI.  Orders:  No orders of the defined types were placed in this encounter.  No orders of the defined types were placed in this encounter.    Procedures: No procedures performed  Clinical Data: No additional findings.  ROS:  All other systems negative, except as noted in the HPI. Review of Systems  Objective: Vital Signs: There were no vitals taken for this visit.  Specialty Comments:  No specialty comments available.  PMFS History: Patient Active Problem List   Diagnosis Date Noted  . PAD (peripheral artery disease) ([place]) 02/19/2017  . Chronic renal disease, stage III ([place]) 02/19/2017  . Morbid obesity ([place]) 11/05/2016  . Ulcer of toe of right foot, limited to breakdown of skin ([place]) 10/11/2016  . Non-pressure chronic ulcer of left calf, limited to breakdown of skin ([place]) 06/04/2016  . Idiopathic chronic venous hypertension of right lower extremity with ulcer and inflammation ([place]) 06/04/2016  . Status post below knee amputation of left lower extremity ([place]) 02/23/2016  . Type  2 diabetes mellitus with diabetic polyneuropathy, with long-term current use of insulin ([place]) 03/29/2015  . Advance directive discussed with patient 02/07/2014  . Normocytic anemia 04/01/2013  . Diabetic Charcot foot ([place]) 04/01/2013  . Routine general medical examination at a health care facility 12/25/2010  . HEMORRHOIDS-INTERNAL 11/22/2009  . PERSONAL HX COLONIC POLYPS 11/22/2009  . Venous (peripheral) insufficiency 06/29/2008  . Osteoarthritis, multiple sites 04/03/2007  . ERECTILE DYSFUNCTION, ORGANIC 06/13/2006  . Hyperlipemia 06/11/2006  . GLAUCOMA 06/11/2006  .  Coronary atherosclerosis of native coronary artery 06/11/2006  . Obstructive sleep apnea 06/11/2006   Past Medical History:  Diagnosis Date  . Coronary atherosclerosis of unspecified type of vessel, native or graft   . Diabetes mellitus without complication ([place])   . Hemorrhage of rectum and anus   . History of kidney stones   . Impotence of organic origin   . Internal hemorrhoids without mention of complication   . Malignant neoplasm of prostate ([place])   . Mononeuritis of unspecified site   . Osteoarthrosis, unspecified whether generalized or localized, unspecified site   . Other and unspecified hyperlipidemia   . Personal history of colonic polyps   . Personal history of diabetic foot ulcer    saw wound center, resolved 05/08/2010  . Personal history of gallstones   . Routine general medical examination at a health care facility   . Type II or unspecified type diabetes mellitus with neurological manifestations, not stated as uncontrolled(250.60)   . Unspecified glaucoma(365.9)   . Unspecified sleep apnea    cpcp  . Unspecified venous (peripheral) insufficiency     Family History  Problem Relation Age of Onset  . Lung cancer Father   . Multiple sclerosis Mother   . Heart attack Unknown        paternal aunts and uncles  . Peripheral vascular disease Maternal Grandfather        several amputations    Past Surgical History:  Procedure Laterality Date  . AMPUTATION Left 12/30/2012   Procedure: AMPUTATION RAY ;  Surgeon: Meredith Pel, MD;  [facility];  Service: Orthopedics;  Laterality: Left;  LEFT GREAT TOE RAY AMPUTATION  . AMPUTATION Left 02/23/2016   Procedure: Left Below Knee Amputation;  Surgeon: [name], MD;  Location: [facility];  Service: Orthopedics;  Laterality: Left;  . APPENDECTOMY    . BASAL CELL CARCINOMA EXCISION  2/16   left forearm  . CARDIAC CATHETERIZATION  1998   Negative  . CATARACT EXTRACTION Right 2017   then lid surgery  . CORONARY ARTERY  BYPASS GRAFT  09/2005   Post op AFIB  . EYE SURGERY    . FOOT BONE EXCISION Left 11/03/2013   DR Verlin Duke   . I&D EXTREMITY Left 11/03/2013   Procedure: Left Foot Partial Bone Excision Cuboid and Medial Cuneiform, Wound Closures;  Surgeon: [name], MD;  Location: [facility];  Service: Orthopedics;  Laterality: Left;  . INSERTION PROSTATE RADIATION SEED  2009   RT and seeds for prostate cancer  . KIDNEY STONE SURGERY  04/1993  . RCA stents  04/2003   EF 55%  . RETINAL DETACHMENT SURGERY  2002-2003  . thrombosed vein  1993   Right leg   Social History   Occupational History  . Occupation: Heavy equipment mechanic--retired  Tobacco Use  . Smoking status: Former Smoker    Packs/day: 2.00    Years: 35.00    Pack years: 70.00    Types: Cigars, Cigarettes  Last attempt to quit: 01/07/2001    Years since quitting: 16.6  . Smokeless tobacco: Never Used  Substance and Sexual Activity  . Alcohol use: Yes    Comment: rare  . Drug use: No  . Sexual activity: Yes    Partners: Female

## 2017-09-01 ENCOUNTER — Ambulatory Visit (INDEPENDENT_AMBULATORY_CARE_PROVIDER_SITE_OTHER): Payer: Medicare Other | Admitting: Orthopedic Surgery

## 2017-09-01 ENCOUNTER — Encounter (INDEPENDENT_AMBULATORY_CARE_PROVIDER_SITE_OTHER): Payer: Self-pay | Admitting: Orthopedic Surgery

## 2017-09-01 DIAGNOSIS — I2510 Atherosclerotic heart disease of native coronary artery without angina pectoris: Secondary | ICD-10-CM

## 2017-09-01 DIAGNOSIS — Z89512 Acquired absence of left leg below knee: Secondary | ICD-10-CM | POA: Diagnosis not present

## 2017-09-01 MED ORDER — SILVER SULFADIAZINE 1 % EX CREA: 1.0000 "application " | TOPICAL_CREAM | Freq: Every day | CUTANEOUS | 3 refills | Status: DC

## 2017-09-01 NOTE — Progress Notes (Signed)
Office Visit Note   Patient: [name]           Date of Birth: [birth year]           MRN: [id number] Visit Date: 09/01/2017              Requested by: [name], MD [place], [place] [address] PCP: [name], MD  Chief Complaint  Patient presents with  . Left Leg - Wound Check    LT BKA- doing good      HPI: Patient is a 67 year old gentleman left transtibial amputation who has completed 1 week of Bactrim DS and is currently using Silvadene dressing changes he has used probiotics orally and topically probiotics weekly.  Assessment & Plan: Visit Diagnoses:  1. Status post below knee amputation of left lower extremity ([place])     Plan: Continue with current treatment prescription called in for Silvadene continue oral probiotics topical probiotics weekly continue with compression.  Follow-Up Instructions: Return in about 2 weeks (around 09/15/2017).   Ortho Exam  Patient is alert, oriented, no adenopathy, well-dressed, normal affect, normal respiratory effort. Examination the wound has good granulation tissue the wound is 25 x 35 mm and 1 mm deep.  There is no cellulitis no odor no drainage no signs of infection.  Imaging: No results found. No images are attached to the encounter.  Labs: Lab Results  Component Value Date   HGBA1C 7.3 (A) 08/05/2017   HGBA1C 6.3 03/06/2017   HGBA1C 5.7 11/05/2016   ESRSEDRATE 35 (H) 03/31/2013   CRP 5.3 (H) 03/31/2013   REPTSTATUS 11/03/2016 FINAL 11/01/2016   GRAMSTAIN  12/30/2012    FEW WBC PRESENT, PREDOMINANTLY PMN RARE SQUAMOUS EPITHELIAL CELLS PRESENT ABUNDANT GRAM NEGATIVE RODS MODERATE GRAM POSITIVE COCCI IN PAIRS IN CLUSTERS   CULT  11/01/2016    NO GROWTH Performed at [facility], [place] [address], [place], [place] [address]    [place] OXYTOCA 12/15/2012   LABORGA PSEUDOMONAS AERUGINOSA 12/15/2012     Lab Results  Component Value Date   ALBUMIN 4.1 02/19/2017     ALBUMIN 4.4 11/01/2016   ALBUMIN 4.3 02/14/2016    There is no height or weight on file to calculate BMI.  Orders:  No orders of the defined types were placed in this encounter.  No orders of the defined types were placed in this encounter.    Procedures: No procedures performed  Clinical Data: No additional findings.  ROS:  All other systems negative, except as noted in the HPI. Review of Systems  Objective: Vital Signs: There were no vitals taken for this visit.  Specialty Comments:  No specialty comments available.  PMFS History: Patient Active Problem List   Diagnosis Date Noted  . PAD (peripheral artery disease) ([place]) 02/19/2017  . Chronic renal disease, stage III ([place]) 02/19/2017  . Morbid obesity ([place]) 11/05/2016  . Ulcer of toe of right foot, limited to breakdown of skin ([place]) 10/11/2016  . Non-pressure chronic ulcer of left calf, limited to breakdown of skin ([place]) 06/04/2016  . Idiopathic chronic venous hypertension of right lower extremity with ulcer and inflammation ([place]) 06/04/2016  . Status post below knee amputation of left lower extremity ([place]) 02/23/2016  . Type 2 diabetes mellitus with diabetic polyneuropathy, with long-term current use of insulin ([place]) 03/29/2015  . Advance directive discussed with patient 02/07/2014  . Normocytic anemia 04/01/2013  . Diabetic Charcot foot ([place]) 04/01/2013  . Routine general  medical examination at a health care facility 12/25/2010  . HEMORRHOIDS-INTERNAL 11/22/2009  . PERSONAL HX COLONIC POLYPS 11/22/2009  . Venous (peripheral) insufficiency 06/29/2008  . Osteoarthritis, multiple sites 04/03/2007  . ERECTILE DYSFUNCTION, ORGANIC 06/13/2006  . Hyperlipemia 06/11/2006  . GLAUCOMA 06/11/2006  . Coronary atherosclerosis of native coronary artery 06/11/2006  . Obstructive sleep apnea 06/11/2006   Past Medical History:  Diagnosis Date  . Coronary atherosclerosis of unspecified type of vessel, native or graft   .  Diabetes mellitus without complication ([place])   . Hemorrhage of rectum and anus   . History of kidney stones   . Impotence of organic origin   . Internal hemorrhoids without mention of complication   . Malignant neoplasm of prostate ([place])   . Mononeuritis of unspecified site   . Osteoarthrosis, unspecified whether generalized or localized, unspecified site   . Other and unspecified hyperlipidemia   . Personal history of colonic polyps   . Personal history of diabetic foot ulcer    saw wound center, resolved 05/08/2010  . Personal history of gallstones   . Routine general medical examination at a health care facility   . Type II or unspecified type diabetes mellitus with neurological manifestations, not stated as uncontrolled(250.60)   . Unspecified glaucoma(365.9)   . Unspecified sleep apnea    cpcp  . Unspecified venous (peripheral) insufficiency     Family History  Problem Relation Age of Onset  . Lung cancer Father   . Multiple sclerosis Mother   . Heart attack Unknown        paternal aunts and uncles  . Peripheral vascular disease Maternal Grandfather        several amputations    Past Surgical History:  Procedure Laterality Date  . AMPUTATION Left 12/30/2012   Procedure: AMPUTATION RAY ;  Surgeon: Meredith Pel, MD;  [facility];  Service: Orthopedics;  Laterality: Left;  LEFT GREAT TOE RAY AMPUTATION  . AMPUTATION Left 02/23/2016   Procedure: Left Below Knee Amputation;  Surgeon: [name], MD;  Location: [facility];  Service: Orthopedics;  Laterality: Left;  . APPENDECTOMY    . BASAL CELL CARCINOMA EXCISION  2/16   left forearm  . CARDIAC CATHETERIZATION  1998   Negative  . CATARACT EXTRACTION Right 2017   then lid surgery  . CORONARY ARTERY BYPASS GRAFT  09/2005   Post op AFIB  . EYE SURGERY    . FOOT BONE EXCISION Left 11/03/2013   DR DUDA   . I&D EXTREMITY Left 11/03/2013   Procedure: Left Foot Partial Bone Excision Cuboid and Medial Cuneiform, Wound  Closures;  Surgeon: [name], MD;  Location: [facility];  Service: Orthopedics;  Laterality: Left;  . INSERTION PROSTATE RADIATION SEED  2009   RT and seeds for prostate cancer  . KIDNEY STONE SURGERY  04/1993  . RCA stents  04/2003   EF 55%  . RETINAL DETACHMENT SURGERY  2002-2003  . thrombosed vein  1993   Right leg   Social History   Occupational History  . Occupation: Heavy equipment mechanic--retired  Tobacco Use  . Smoking status: Former Smoker    Packs/day: 2.00    Years: 35.00    Pack years: 70.00    Types: Cigars, Cigarettes    Last attempt to quit: 01/07/2001    Years since quitting: 16.6  . Smokeless tobacco: Never Used  Substance and Sexual Activity  . Alcohol use: Yes    Comment: rare  . Drug  use: No  . Sexual activity: Yes    Partners: Female

## 2017-09-23 ENCOUNTER — Encounter (INDEPENDENT_AMBULATORY_CARE_PROVIDER_SITE_OTHER): Payer: Self-pay | Admitting: Orthopedic Surgery

## 2017-09-23 ENCOUNTER — Ambulatory Visit (INDEPENDENT_AMBULATORY_CARE_PROVIDER_SITE_OTHER): Payer: Medicare Other | Admitting: Orthopedic Surgery

## 2017-09-23 VITALS — Ht 74.0 in | Wt 290.0 lb

## 2017-09-23 DIAGNOSIS — Z89512 Acquired absence of left leg below knee: Secondary | ICD-10-CM

## 2017-09-23 DIAGNOSIS — I2510 Atherosclerotic heart disease of native coronary artery without angina pectoris: Secondary | ICD-10-CM

## 2017-09-23 DIAGNOSIS — L97221 Non-pressure chronic ulcer of left calf limited to breakdown of skin: Secondary | ICD-10-CM | POA: Diagnosis not present

## 2017-09-23 NOTE — Progress Notes (Signed)
Office Visit Note   Patient: [name]           Date of Birth: 31-Aug-1950           MRN: [id number] Visit Date: 09/23/2017              Requested by: [name], MD [place], [place] [address] PCP: [name], MD  Chief Complaint  Patient presents with  . Left Foot - Routine Post Op, Follow-up    Quite pleased with the improvement in the wound healing.  HPI: Patient is a 67 year old gentleman status post left transtibial amputation who developed blisters and ulcers from his prosthesis from [place].  He is currently working on wound care with compression he has just completed 2 weeks course of antibiotics he is using probiotics on the wound once a week currently wearing a dry dressing and the stump shrinker.  Patient  Assessment & Plan: Visit Diagnoses:  1. Status post below knee amputation of left lower extremity ([place])   2. Non-pressure chronic ulcer of left calf, limited to breakdown of skin ([place])     Plan: Continue with current care we will discontinue the antibiotics continue with the probiotics to the wound topically once a week continue with soap and water cleansing dry dressing change compression stump shrinker.  Follow-Up Instructions: Return in about 3 weeks (around 10/14/2017).   Ortho Exam  Patient is alert, oriented, no adenopathy, well-dressed, normal affect, normal respiratory effort. Examination patient has venous stasis changes in the left leg the ulcers have improved significantly the proximal ulcer is 3 cm in diameter 0.1 mm deep with healthy centimeters in diameter 1 mm deep also with 100% healthy beefy granulation tissue there is been rapid epithelialization of the wound.  There is no redness no cellulitis no odor no signs of infection.  Imaging: No results found. No images are attached to the encounter.  Labs: Lab Results  Component Value Date   HGBA1C 7.3 (A) 08/05/2017   HGBA1C 6.3 03/06/2017   HGBA1C 5.7  11/05/2016   ESRSEDRATE 35 (H) 03/31/2013   CRP 5.3 (H) 03/31/2013   REPTSTATUS 11/03/2016 FINAL 11/01/2016   GRAMSTAIN  12/30/2012    FEW WBC PRESENT, PREDOMINANTLY PMN RARE SQUAMOUS EPITHELIAL CELLS PRESENT ABUNDANT GRAM NEGATIVE RODS MODERATE GRAM POSITIVE COCCI IN PAIRS IN CLUSTERS   CULT  11/01/2016    NO GROWTH Performed at [facility], [place] [address] [place], [place] [address]    [place] OXYTOCA 12/15/2012   LABORGA PSEUDOMONAS AERUGINOSA 12/15/2012     Lab Results  Component Value Date   ALBUMIN 4.1 02/19/2017   ALBUMIN 4.4 11/01/2016   ALBUMIN 4.3 02/14/2016    Body mass index is 37.23 kg/m.  Orders:  No orders of the defined types were placed in this encounter.  No orders of the defined types were placed in this encounter.    Procedures: No procedures performed  Clinical Data: No additional findings.  ROS:  All other systems negative, except as noted in the HPI. Review of Systems  Objective: Vital Signs: Ht 6\' 2"  (1.88 m)   Wt 290 lb (131.5 kg)   BMI 37.23 kg/m   Specialty Comments:  No specialty comments available.  PMFS History: Patient Active Problem List   Diagnosis Date Noted  . PAD (peripheral artery disease) ([place]) 02/19/2017  . Chronic renal disease, stage III ([place]) 02/19/2017  . Morbid obesity ([place]) 11/05/2016  . Ulcer of  toe of right foot, limited to breakdown of skin ([place]) 10/11/2016  . Non-pressure chronic ulcer of left calf, limited to breakdown of skin ([place]) 06/04/2016  . Idiopathic chronic venous hypertension of right lower extremity with ulcer and inflammation ([place]) 06/04/2016  . Status post below knee amputation of left lower extremity ([place]) 02/23/2016  . Type 2 diabetes mellitus with diabetic polyneuropathy, with long-term current use of insulin ([place]) 03/29/2015  . Advance directive discussed with patient 02/07/2014  . Normocytic anemia 04/01/2013  . Diabetic Charcot foot ([place]) 04/01/2013  . Routine  general medical examination at a health care facility 12/25/2010  . HEMORRHOIDS-INTERNAL 11/22/2009  . PERSONAL HX COLONIC POLYPS 11/22/2009  . Venous (peripheral) insufficiency 06/29/2008  . Osteoarthritis, multiple sites 04/03/2007  . ERECTILE DYSFUNCTION, ORGANIC 06/13/2006  . Hyperlipemia 06/11/2006  . GLAUCOMA 06/11/2006  . Coronary atherosclerosis of native coronary artery 06/11/2006  . Obstructive sleep apnea 06/11/2006   Past Medical History:  Diagnosis Date  . Coronary atherosclerosis of unspecified type of vessel, native or graft   . Diabetes mellitus without complication ([place])   . Hemorrhage of rectum and anus   . History of kidney stones   . Impotence of organic origin   . Internal hemorrhoids without mention of complication   . Malignant neoplasm of prostate ([place])   . Mononeuritis of unspecified site   . Osteoarthrosis, unspecified whether generalized or localized, unspecified site   . Other and unspecified hyperlipidemia   . Personal history of colonic polyps   . Personal history of diabetic foot ulcer    saw wound center, resolved 05/08/2010  . Personal history of gallstones   . Routine general medical examination at a health care facility   . Type II or unspecified type diabetes mellitus with neurological manifestations, not stated as uncontrolled(250.60)   . Unspecified glaucoma(365.9)   . Unspecified sleep apnea    cpcp  . Unspecified venous (peripheral) insufficiency     Family History  Problem Relation Age of Onset  . Lung cancer Father   . Multiple sclerosis Mother   . Heart attack Unknown        paternal aunts and uncles  . Peripheral vascular disease Maternal Grandfather        several amputations    Past Surgical History:  Procedure Laterality Date  . AMPUTATION Left 12/30/2012   Procedure: AMPUTATION RAY ;  Surgeon: Meredith Pel, MD;  [facility];  Service: Orthopedics;  Laterality: Left;  LEFT GREAT TOE RAY AMPUTATION  . AMPUTATION  Left 02/23/2016   Procedure: Left Below Knee Amputation;  Surgeon: [name], MD;  Location: [facility];  Service: Orthopedics;  Laterality: Left;  . APPENDECTOMY    . BASAL CELL CARCINOMA EXCISION  2/16   left forearm  . CARDIAC CATHETERIZATION  1998   Negative  . CATARACT EXTRACTION Right 2017   then lid surgery  . CORONARY ARTERY BYPASS GRAFT  09/2005   Post op AFIB  . EYE SURGERY    . FOOT BONE EXCISION Left 11/03/2013   DR DUDA   . I&D EXTREMITY Left 11/03/2013   Procedure: Left Foot Partial Bone Excision Cuboid and Medial Cuneiform, Wound Closures;  Surgeon: [name], MD;  Location: [facility];  Service: Orthopedics;  Laterality: Left;  . INSERTION PROSTATE RADIATION SEED  2009   RT and seeds for prostate cancer  . KIDNEY STONE SURGERY  04/1993  . RCA stents  04/2003   EF 55%  . RETINAL DETACHMENT SURGERY  2002-2003  . thrombosed vein  1993   Right leg   Social History   Occupational History  . Occupation: Heavy equipment mechanic--retired  Tobacco Use  . Smoking status: Former Smoker    Packs/day: 2.00    Years: 35.00    Pack years: 70.00    Types: Cigars, Cigarettes    Last attempt to quit: 01/07/2001    Years since quitting: 16.7  . Smokeless tobacco: Never Used  Substance and Sexual Activity  . Alcohol use: Yes    Comment: rare  . Drug use: No  . Sexual activity: Yes    Partners: Female

## 2017-10-16 ENCOUNTER — Ambulatory Visit (INDEPENDENT_AMBULATORY_CARE_PROVIDER_SITE_OTHER): Payer: Medicare Other | Admitting: Physician Assistant

## 2017-10-16 ENCOUNTER — Encounter (INDEPENDENT_AMBULATORY_CARE_PROVIDER_SITE_OTHER): Payer: Self-pay | Admitting: Orthopedic Surgery

## 2017-10-16 VITALS — Ht 74.0 in | Wt 290.0 lb

## 2017-10-16 DIAGNOSIS — E1142 Type 2 diabetes mellitus with diabetic polyneuropathy: Secondary | ICD-10-CM

## 2017-10-16 DIAGNOSIS — I87331 Chronic venous hypertension (idiopathic) with ulcer and inflammation of right lower extremity: Secondary | ICD-10-CM

## 2017-10-16 DIAGNOSIS — Z89512 Acquired absence of left leg below knee: Secondary | ICD-10-CM | POA: Diagnosis not present

## 2017-10-16 DIAGNOSIS — L97919 Non-pressure chronic ulcer of unspecified part of right lower leg with unspecified severity: Secondary | ICD-10-CM | POA: Diagnosis not present

## 2017-10-16 DIAGNOSIS — Z794 Long term (current) use of insulin: Secondary | ICD-10-CM | POA: Diagnosis not present

## 2017-10-16 DIAGNOSIS — I2510 Atherosclerotic heart disease of native coronary artery without angina pectoris: Secondary | ICD-10-CM

## 2017-10-16 NOTE — Progress Notes (Signed)
Office Visit Note   Patient: [name]           Date of Birth: 06-Jun-1950           MRN: [id number] Visit Date: 10/16/2017              Requested by: [name], MD [address] [place], [place] [address] PCP: [name], MD  Chief Complaint  Patient presents with  . Left Leg - Routine Post Op    S/p BKA left leg      HPI: The patient is a 67 year old male who seen for follow-up following his left BKA.  He has developed wounds over the tibia and distal stump.  They have been washing and drying daily and using probiotics topically to the area once a week.  They have also been applying Silvadene cream on the other days.  He reports that since last week he has had more pain over the proximal ulcer over the tibia.  They have been using a stump shrinker stocking.  He normally wears silver compression stocking over the right lower extremity and does also have a superficial wound over the right anterior calf.  Assessment & Plan: Visit Diagnoses:  1. History of left below knee amputation ([place])   2. Idiopathic chronic venous hypertension of right lower extremity with ulcer and inflammation (HCC)   3. Type 2 diabetes mellitus with diabetic polyneuropathy, with long-term current use of insulin (HCC)     Plan: I recommended he continue to wash and dry the wounds over the left transtibial amputation and the right calf and then apply silver compression stockings to both the right lower leg and the left transtibial stump.  They are going to discontinue Silvadene at this time as he is having more moisture from the wounds.  Continue offloading with no pressure over the left BKA amputation as much as possible.  We have also recommended whey protein supplementation twice daily.  He will follow-up in 3 weeks.  Follow-Up Instructions: Return in about 3 weeks (around 11/06/2017).   Ortho Exam  Patient is alert, oriented, no adenopathy, well-dressed, normal affect, normal  respiratory effort. He presents at the wheelchair level.  The wounds over the left transtibial stump include 2 superficial ulcers anteriorly 1 over the tibia 1 slightly medial to this and then one small ulcer over the distal area.  All are very superficial. The right anterior calf has a very superficial ulcer which is 3 x 1 x 0.1 cm with serous drainage.  No signs of cellulitis in either leg.  There is edema of both the right lower extremity and the distal left BKA stump. Imaging: No results found. No images are attached to the encounter.  Labs: Lab Results  Component Value Date   HGBA1C 7.3 (A) 08/05/2017   HGBA1C 6.3 03/06/2017   HGBA1C 5.7 11/05/2016   ESRSEDRATE 35 (H) 03/31/2013   CRP 5.3 (H) 03/31/2013   REPTSTATUS 11/03/2016 FINAL 11/01/2016   GRAMSTAIN  12/30/2012    FEW WBC PRESENT, PREDOMINANTLY PMN RARE SQUAMOUS EPITHELIAL CELLS PRESENT ABUNDANT GRAM NEGATIVE RODS MODERATE GRAM POSITIVE COCCI IN PAIRS IN CLUSTERS   CULT  11/01/2016    NO GROWTH Performed at [facility], [place] [address] [place], [place] [address]    [place] OXYTOCA 12/15/2012   LABORGA PSEUDOMONAS AERUGINOSA 12/15/2012     Lab Results  Component Value Date   ALBUMIN 4.1 02/19/2017   ALBUMIN 4.4 11/01/2016  ALBUMIN 4.3 02/14/2016    Body mass index is 37.23 kg/m.  Orders:  No orders of the defined types were placed in this encounter.  No orders of the defined types were placed in this encounter.    Procedures: No procedures performed  Clinical Data: No additional findings.  ROS:  All other systems negative, except as noted in the HPI. Review of Systems  Objective: Vital Signs: Ht 6\' 2"  (1.88 m)   Wt 290 lb (131.5 kg)   BMI 37.23 kg/m   Specialty Comments:  No specialty comments available.  PMFS History: Patient Active Problem List   Diagnosis Date Noted  . PAD (peripheral artery disease) ([place]) 02/19/2017  . Chronic renal disease, stage III ([place])  02/19/2017  . Morbid obesity ([place]) 11/05/2016  . Ulcer of toe of right foot, limited to breakdown of skin ([place]) 10/11/2016  . Non-pressure chronic ulcer of left calf, limited to breakdown of skin ([place]) 06/04/2016  . Idiopathic chronic venous hypertension of right lower extremity with ulcer and inflammation ([place]) 06/04/2016  . Status post below knee amputation of left lower extremity 02/23/2016  . Type 2 diabetes mellitus with diabetic polyneuropathy, with long-term current use of insulin ([place]) 03/29/2015  . Advance directive discussed with patient 02/07/2014  . Normocytic anemia 04/01/2013  . Diabetic Charcot foot ([place]) 04/01/2013  . Routine general medical examination at a health care facility 12/25/2010  . HEMORRHOIDS-INTERNAL 11/22/2009  . PERSONAL HX COLONIC POLYPS 11/22/2009  . Venous (peripheral) insufficiency 06/29/2008  . Osteoarthritis, multiple sites 04/03/2007  . ERECTILE DYSFUNCTION, ORGANIC 06/13/2006  . Hyperlipemia 06/11/2006  . GLAUCOMA 06/11/2006  . Coronary atherosclerosis of native coronary artery 06/11/2006  . Obstructive sleep apnea 06/11/2006   Past Medical History:  Diagnosis Date  . Coronary atherosclerosis of unspecified type of vessel, native or graft   . Diabetes mellitus without complication ([place])   . Hemorrhage of rectum and anus   . History of kidney stones   . Impotence of organic origin   . Internal hemorrhoids without mention of complication   . Malignant neoplasm of prostate ([place])   . Mononeuritis of unspecified site   . Osteoarthrosis, unspecified whether generalized or localized, unspecified site   . Other and unspecified hyperlipidemia   . Personal history of colonic polyps   . Personal history of diabetic foot ulcer    saw wound center, resolved 05/08/2010  . Personal history of gallstones   . Routine general medical examination at a health care facility   . Type II or unspecified type diabetes mellitus with neurological manifestations, not  stated as uncontrolled(250.60)   . Unspecified glaucoma(365.9)   . Unspecified sleep apnea    cpcp  . Unspecified venous (peripheral) insufficiency     Family History  Problem Relation Age of Onset  . Lung cancer Father   . Multiple sclerosis Mother   . Heart attack Unknown        paternal aunts and uncles  . Peripheral vascular disease Maternal Grandfather        several amputations    Past Surgical History:  Procedure Laterality Date  . AMPUTATION Left 12/30/2012   Procedure: AMPUTATION RAY ;  Surgeon: Meredith Pel, MD;  [facility];  Service: Orthopedics;  Laterality: Left;  LEFT GREAT TOE RAY AMPUTATION  . AMPUTATION Left 02/23/2016   Procedure: Left Below Knee Amputation;  Surgeon: [name], MD;  Location: [facility];  Service: Orthopedics;  Laterality: Left;  . APPENDECTOMY    .  BASAL CELL CARCINOMA EXCISION  2/16   left forearm  . CARDIAC CATHETERIZATION  1998   Negative  . CATARACT EXTRACTION Right 2017   then lid surgery  . CORONARY ARTERY BYPASS GRAFT  09/2005   Post op AFIB  . EYE SURGERY    . FOOT BONE EXCISION Left 11/03/2013   DR DUDA   . I&D EXTREMITY Left 11/03/2013   Procedure: Left Foot Partial Bone Excision Cuboid and Medial Cuneiform, Wound Closures;  Surgeon: [name], MD;  Location: [facility];  Service: Orthopedics;  Laterality: Left;  . INSERTION PROSTATE RADIATION SEED  2009   RT and seeds for prostate cancer  . KIDNEY STONE SURGERY  04/1993  . RCA stents  04/2003   EF 55%  . RETINAL DETACHMENT SURGERY  2002-2003  . thrombosed vein  1993   Right leg   Social History   Occupational History  . Occupation: Heavy equipment mechanic--retired  Tobacco Use  . Smoking status: Former Smoker    Packs/day: 2.00    Years: 35.00    Pack years: 70.00    Types: Cigars, Cigarettes    Last attempt to quit: 01/07/2001    Years since quitting: 16.7  . Smokeless tobacco: Never Used  Substance and Sexual Activity  . Alcohol use: Yes    Comment: rare   . Drug use: No  . Sexual activity: Yes    Partners: Female

## 2017-11-05 DIAGNOSIS — H02209 Unspecified lagophthalmos unspecified eye, unspecified eyelid: Secondary | ICD-10-CM | POA: Diagnosis not present

## 2017-11-05 DIAGNOSIS — H11231 Symblepharon, right eye: Secondary | ICD-10-CM | POA: Diagnosis not present

## 2017-11-06 ENCOUNTER — Encounter (INDEPENDENT_AMBULATORY_CARE_PROVIDER_SITE_OTHER): Payer: Self-pay | Admitting: Orthopedic Surgery

## 2017-11-06 ENCOUNTER — Ambulatory Visit (INDEPENDENT_AMBULATORY_CARE_PROVIDER_SITE_OTHER): Payer: Medicare Other | Admitting: Physician Assistant

## 2017-11-06 VITALS — Ht 74.0 in | Wt 290.0 lb

## 2017-11-06 DIAGNOSIS — E1142 Type 2 diabetes mellitus with diabetic polyneuropathy: Secondary | ICD-10-CM

## 2017-11-06 DIAGNOSIS — I87331 Chronic venous hypertension (idiopathic) with ulcer and inflammation of right lower extremity: Secondary | ICD-10-CM | POA: Diagnosis not present

## 2017-11-06 DIAGNOSIS — L97919 Non-pressure chronic ulcer of unspecified part of right lower leg with unspecified severity: Secondary | ICD-10-CM | POA: Diagnosis not present

## 2017-11-06 DIAGNOSIS — Z89512 Acquired absence of left leg below knee: Secondary | ICD-10-CM | POA: Diagnosis not present

## 2017-11-06 DIAGNOSIS — I2510 Atherosclerotic heart disease of native coronary artery without angina pectoris: Secondary | ICD-10-CM

## 2017-11-06 DIAGNOSIS — Z794 Long term (current) use of insulin: Secondary | ICD-10-CM | POA: Diagnosis not present

## 2017-11-06 NOTE — Progress Notes (Signed)
Office Visit Note   Patient: [name]           Date of Birth: 1950/02/11           MRN: [id number] Visit Date: 11/06/2017              Requested by: [name], MD [address] [place], [place] [address] PCP: [name], MD  Chief Complaint  Patient presents with  . Left Leg - Follow-up    BKA of left leg  . Right Foot - Wound Check      HPI: The patient is a 67 yo male here for follow up of left transtibial amputation ulcer and ulcers of his right lower leg. He reports that after his last visit, an blister formed on the right 5th toe and then the blister opened and left an ulcer. He has been wearing a compression stocking. They cut out the Sadik on the right to keep pressure off the ulcer. He has been eating protein bars and we discussed that he should use whey protein powder to keep the carbohydrates low in his protein supplement. He does take a daily probiotic.   Assessment & Plan: Visit Diagnoses:  1. History of left below knee amputation ([place])   2. Idiopathic chronic venous hypertension of right lower extremity with ulcer and inflammation (HCC)   3. Type 2 diabetes mellitus with diabetic polyneuropathy, with long-term current use of insulin (HCC)     Plan: Silver alginate to the right foot and right calf ulcers and then a Dynaflex layered compression dressing was applied. Continue silver shrinker stocking to the left BKA. Whey protein supplement. Elevate the leg as much as possible.Follow up in 1 week.   Follow-Up Instructions: Return in about 1 week (around 11/13/2017).   Ortho Exam  Patient is alert, oriented, no adenopathy, well-dressed, normal affect, normal respiratory effort. The right lower leg - edema is increased. New ulcer over the right 5th dorsal toe is 10 x 3 mm, ulcers over the right calf, anteriorly 66mm diameter, medially 2 mm diameter. Pitting edema and erythema noted without cellulitis. Palpable dorsalis pedis and posterior  tibialis pulses.  Left transtibial amputation with superficial ulcers x 3- anterior over the tibia 10 mm diameter, distal residual limb 3 mm diameter, 10 mm medial stump. No cellulitis or other signs of infection.   Imaging: No results found. No images are attached to the encounter.  Labs: Lab Results  Component Value Date   HGBA1C 7.3 (A) 08/05/2017   HGBA1C 6.3 03/06/2017   HGBA1C 5.7 11/05/2016   ESRSEDRATE 35 (H) 03/31/2013   CRP 5.3 (H) 03/31/2013   REPTSTATUS 11/03/2016 FINAL 11/01/2016   GRAMSTAIN  12/30/2012    FEW WBC PRESENT, PREDOMINANTLY PMN RARE SQUAMOUS EPITHELIAL CELLS PRESENT ABUNDANT GRAM NEGATIVE RODS MODERATE GRAM POSITIVE COCCI IN PAIRS IN CLUSTERS   CULT  11/01/2016    NO GROWTH Performed at [facility], [place] [address] [place], [place] [address]    [place] OXYTOCA 12/15/2012   LABORGA PSEUDOMONAS AERUGINOSA 12/15/2012     Lab Results  Component Value Date   ALBUMIN 4.1 02/19/2017   ALBUMIN 4.4 11/01/2016   ALBUMIN 4.3 02/14/2016    Body mass index is 37.23 kg/m.  Orders:  No orders of the defined types were placed in this encounter.  No orders of the defined types were placed in this encounter.    Procedures: No procedures performed  Clinical Data: No additional  findings.  ROS:  All other systems negative, except as noted in the HPI. Review of Systems  Objective: Vital Signs: Ht 6\' 2"  (1.88 m)   Wt 290 lb (131.5 kg)   BMI 37.23 kg/m   Specialty Comments:  No specialty comments available.  PMFS History: Patient Active Problem List   Diagnosis Date Noted  . PAD (peripheral artery disease) ([place]) 02/19/2017  . Chronic renal disease, stage III ([place]) 02/19/2017  . Morbid obesity ([place]) 11/05/2016  . Ulcer of toe of right foot, limited to breakdown of skin ([place]) 10/11/2016  . Non-pressure chronic ulcer of left calf, limited to breakdown of skin ([place]) 06/04/2016  . Idiopathic chronic venous hypertension of right  lower extremity with ulcer and inflammation ([place]) 06/04/2016  . Status post below knee amputation of left lower extremity 02/23/2016  . Type 2 diabetes mellitus with diabetic polyneuropathy, with long-term current use of insulin ([place]) 03/29/2015  . Advance directive discussed with patient 02/07/2014  . Normocytic anemia 04/01/2013  . Diabetic Charcot foot ([place]) 04/01/2013  . Routine general medical examination at a health care facility 12/25/2010  . HEMORRHOIDS-INTERNAL 11/22/2009  . PERSONAL HX COLONIC POLYPS 11/22/2009  . Venous (peripheral) insufficiency 06/29/2008  . Osteoarthritis, multiple sites 04/03/2007  . ERECTILE DYSFUNCTION, ORGANIC 06/13/2006  . Hyperlipemia 06/11/2006  . GLAUCOMA 06/11/2006  . Coronary atherosclerosis of native coronary artery 06/11/2006  . Obstructive sleep apnea 06/11/2006   Past Medical History:  Diagnosis Date  . Coronary atherosclerosis of unspecified type of vessel, native or graft   . Diabetes mellitus without complication ([place])   . Hemorrhage of rectum and anus   . History of kidney stones   . Impotence of organic origin   . Internal hemorrhoids without mention of complication   . Malignant neoplasm of prostate ([place])   . Mononeuritis of unspecified site   . Osteoarthrosis, unspecified whether generalized or localized, unspecified site   . Other and unspecified hyperlipidemia   . Personal history of colonic polyps   . Personal history of diabetic foot ulcer    saw wound center, resolved 05/08/2010  . Personal history of gallstones   . Routine general medical examination at a health care facility   . Type II or unspecified type diabetes mellitus with neurological manifestations, not stated as uncontrolled(250.60)   . Unspecified glaucoma(365.9)   . Unspecified sleep apnea    cpcp  . Unspecified venous (peripheral) insufficiency     Family History  Problem Relation Age of Onset  . Lung cancer Father   . Multiple sclerosis Mother   . Heart  attack Unknown        paternal aunts and uncles  . Peripheral vascular disease Maternal Grandfather        several amputations    Past Surgical History:  Procedure Laterality Date  . AMPUTATION Left 12/30/2012   Procedure: AMPUTATION RAY ;  Surgeon: Meredith Pel, MD;  [facility];  Service: Orthopedics;  Laterality: Left;  LEFT GREAT TOE RAY AMPUTATION  . AMPUTATION Left 02/23/2016   Procedure: Left Below Knee Amputation;  Surgeon: [name], MD;  Location: [facility];  Service: Orthopedics;  Laterality: Left;  . APPENDECTOMY    . BASAL CELL CARCINOMA EXCISION  2/16   left forearm  . CARDIAC CATHETERIZATION  1998   Negative  . CATARACT EXTRACTION Right 2017   then lid surgery  . CORONARY ARTERY BYPASS GRAFT  09/2005   Post op AFIB  . EYE SURGERY    .  FOOT BONE EXCISION Left 11/03/2013   DR DUDA   . I&D EXTREMITY Left 11/03/2013   Procedure: Left Foot Partial Bone Excision Cuboid and Medial Cuneiform, Wound Closures;  Surgeon: [name], MD;  Location: [facility];  Service: Orthopedics;  Laterality: Left;  . INSERTION PROSTATE RADIATION SEED  2009   RT and seeds for prostate cancer  . KIDNEY STONE SURGERY  04/1993  . RCA stents  04/2003   EF 55%  . RETINAL DETACHMENT SURGERY  2002-2003  . thrombosed vein  1993   Right leg   Social History   Occupational History  . Occupation: Heavy equipment mechanic--retired  Tobacco Use  . Smoking status: Former Smoker    Packs/day: 2.00    Years: 35.00    Pack years: 70.00    Types: Cigars, Cigarettes    Last attempt to quit: 01/07/2001    Years since quitting: 16.8  . Smokeless tobacco: Never Used  Substance and Sexual Activity  . Alcohol use: Yes    Comment: rare  . Drug use: No  . Sexual activity: Yes    Partners: Female

## 2017-11-11 ENCOUNTER — Telehealth: Payer: Self-pay | Admitting: Internal Medicine

## 2017-11-11 MED ORDER — INSULIN ASPART 100 UNIT/ML FLEXPEN: PEN_INJECTOR | SUBCUTANEOUS | 1 refills | Status: DC

## 2017-11-11 NOTE — Telephone Encounter (Signed)
Patient requests RX for Novalog Flex Pen sent to [facility] in [place] asap. Patient is running out of medication. Patient normally gets Rx through mail in pharmacy but they told patient his Rx has no refills, therefore he needs [facility] to [facility] [place].

## 2017-11-11 NOTE — Telephone Encounter (Signed)
RX sent

## 2017-11-12 ENCOUNTER — Encounter (INDEPENDENT_AMBULATORY_CARE_PROVIDER_SITE_OTHER): Payer: Self-pay | Admitting: Family

## 2017-11-12 ENCOUNTER — Ambulatory Visit (INDEPENDENT_AMBULATORY_CARE_PROVIDER_SITE_OTHER): Payer: Medicare Other | Admitting: Family

## 2017-11-12 VITALS — Ht 74.0 in | Wt 290.0 lb

## 2017-11-12 DIAGNOSIS — I2510 Atherosclerotic heart disease of native coronary artery without angina pectoris: Secondary | ICD-10-CM

## 2017-11-12 DIAGNOSIS — Z89512 Acquired absence of left leg below knee: Secondary | ICD-10-CM | POA: Diagnosis not present

## 2017-11-12 DIAGNOSIS — L97221 Non-pressure chronic ulcer of left calf limited to breakdown of skin: Secondary | ICD-10-CM | POA: Diagnosis not present

## 2017-11-12 DIAGNOSIS — Z794 Long term (current) use of insulin: Secondary | ICD-10-CM | POA: Diagnosis not present

## 2017-11-12 DIAGNOSIS — L97511 Non-pressure chronic ulcer of other part of right foot limited to breakdown of skin: Secondary | ICD-10-CM

## 2017-11-12 DIAGNOSIS — L97919 Non-pressure chronic ulcer of unspecified part of right lower leg with unspecified severity: Secondary | ICD-10-CM

## 2017-11-12 DIAGNOSIS — E1142 Type 2 diabetes mellitus with diabetic polyneuropathy: Secondary | ICD-10-CM

## 2017-11-12 DIAGNOSIS — I87331 Chronic venous hypertension (idiopathic) with ulcer and inflammation of right lower extremity: Secondary | ICD-10-CM

## 2017-11-12 NOTE — Progress Notes (Signed)
Office Visit Note   Patient: [name]           Date of Birth: 07/08/1950           MRN: [id number] Visit Date: 11/12/2017              Requested by: [name], MD [address] [place], [place] [address] PCP: [name], MD  Chief Complaint  Patient presents with  . Left Leg - Wound Check, Routine Post Op  . Right Leg - Wound Check      HPI: The patient is a 67 yo male seen today in follow-up for ulcerations to his left below the knee amputation residual limb as well as ulcerations to his right lower leg.  Also has ulcer to the dorsum of his fifth toe on the right.  Has been in a Dynaflex wrap for the last week.  This has rolled down about mid shin.   Assessment & Plan: Visit Diagnoses:  1. Non-pressure chronic ulcer of left calf, limited to breakdown of skin ([place])   2. Ulcer of toe of right foot, limited to breakdown of skin ([place])   3. Type 2 diabetes mellitus with diabetic polyneuropathy, with long-term current use of insulin ([place])   4. Idiopathic chronic venous hypertension of right lower extremity with ulcer and inflammation (HCC)   5. Left below-knee amputee [facility])     Plan: Silver alginate to the right calf ulcers and then a unna compression dressing was applied.  Silvadene dressing to left fifth toe ulceration.  Will also apply a compression wrap to his left residual limb with silver cell over the ulcerations.  Elevate the leg as much as possible.Follow up in 1 week.   Follow-Up Instructions: Return in about 5 days (around 11/17/2017).   Ortho Exam  Patient is alert, oriented, no adenopathy, well-dressed, normal affect, normal respiratory effort. The right lower leg with pitting edema indentations from the wrap which has rolled down.  ulcer over the right 5th dorsal toe is 10 x 3 mm, now covered in eschar. ulcers over the right calf, anteriorly 10 mm diameter, medially 10 mm diameter.  Bleeding granulation tissue in both wound beds.  Pitting  edema without cellulitis. Palpable dorsalis pedis and posterior tibialis pulses.  Left transtibial amputation with superficial ulcers x 2- anterior over the tibia 2 cm diameter, distal residual limb 10 mm.  Both with serosanguineous drainage.  No odor.  No cellulitis or other signs of infection.   Imaging: No results found. No images are attached to the encounter.  Labs: Lab Results  Component Value Date   HGBA1C 7.3 (A) 08/05/2017   HGBA1C 6.3 03/06/2017   HGBA1C 5.7 11/05/2016   ESRSEDRATE 35 (H) 03/31/2013   CRP 5.3 (H) 03/31/2013   REPTSTATUS 11/03/2016 FINAL 11/01/2016   GRAMSTAIN  12/30/2012    FEW WBC PRESENT, PREDOMINANTLY PMN RARE SQUAMOUS EPITHELIAL CELLS PRESENT ABUNDANT GRAM NEGATIVE RODS MODERATE GRAM POSITIVE COCCI IN PAIRS IN CLUSTERS   CULT  11/01/2016    NO GROWTH Performed at [facility], [place] [address] [place], [place] [address]    [place] OXYTOCA 12/15/2012   LABORGA PSEUDOMONAS AERUGINOSA 12/15/2012     Lab Results  Component Value Date   ALBUMIN 4.1 02/19/2017   ALBUMIN 4.4 11/01/2016   ALBUMIN 4.3 02/14/2016    Body mass index is 37.23 kg/m.  Orders:  No orders of the defined types were placed in this encounter.  No orders of the defined types were placed in this encounter.    Procedures: No procedures performed  Clinical Data: No additional findings.  ROS:  All other systems negative, except as noted in the HPI. Review of Systems  Constitutional: Negative for chills and fever.  Cardiovascular: Positive for leg swelling.  Skin: Positive for wound. Negative for color change.    Objective: Vital Signs: Ht 6\' 2"  (1.88 m)   Wt 290 lb (131.5 kg)   BMI 37.23 kg/m   Specialty Comments:  No specialty comments available.  PMFS History: Patient Active Problem List   Diagnosis Date Noted  . PAD (peripheral artery disease) ([place]) 02/19/2017  . Chronic renal disease, stage III ([place]) 02/19/2017  . Morbid obesity  ([place]) 11/05/2016  . Ulcer of toe of right foot, limited to breakdown of skin ([place]) 10/11/2016  . Non-pressure chronic ulcer of left calf, limited to breakdown of skin ([place]) 06/04/2016  . Idiopathic chronic venous hypertension of right lower extremity with ulcer and inflammation ([place]) 06/04/2016  . Left below-knee amputee ([place]) 02/23/2016  . Type 2 diabetes mellitus with diabetic polyneuropathy, with long-term current use of insulin ([place]) 03/29/2015  . Advance directive discussed with patient 02/07/2014  . Normocytic anemia 04/01/2013  . Diabetic Charcot foot ([place]) 04/01/2013  . Routine general medical examination at a health care facility 12/25/2010  . HEMORRHOIDS-INTERNAL 11/22/2009  . PERSONAL HX COLONIC POLYPS 11/22/2009  . Venous (peripheral) insufficiency 06/29/2008  . Osteoarthritis, multiple sites 04/03/2007  . ERECTILE DYSFUNCTION, ORGANIC 06/13/2006  . Hyperlipemia 06/11/2006  . GLAUCOMA 06/11/2006  . Coronary atherosclerosis of native coronary artery 06/11/2006  . Obstructive sleep apnea 06/11/2006   Past Medical History:  Diagnosis Date  . Coronary atherosclerosis of unspecified type of vessel, native or graft   . Diabetes mellitus without complication ([place])   . Hemorrhage of rectum and anus   . History of kidney stones   . Impotence of organic origin   . Internal hemorrhoids without mention of complication   . Malignant neoplasm of prostate ([place])   . Mononeuritis of unspecified site   . Osteoarthrosis, unspecified whether generalized or localized, unspecified site   . Other and unspecified hyperlipidemia   . Personal history of colonic polyps   . Personal history of diabetic foot ulcer    saw wound center, resolved 05/08/2010  . Personal history of gallstones   . Routine general medical examination at a health care facility   . Type II or unspecified type diabetes mellitus with neurological manifestations, not stated as uncontrolled(250.60)   . Unspecified  glaucoma(365.9)   . Unspecified sleep apnea    cpcp  . Unspecified venous (peripheral) insufficiency     Family History  Problem Relation Age of Onset  . Lung cancer Father   . Multiple sclerosis Mother   . Heart attack Unknown        paternal aunts and uncles  . Peripheral vascular disease Maternal Grandfather        several amputations    Past Surgical History:  Procedure Laterality Date  . AMPUTATION Left 12/30/2012   Procedure: AMPUTATION RAY ;  Surgeon: Meredith Pel, MD;  Location[facility];  Service: Orthopedics;  Laterality: Left;  LEFT GREAT TOE RAY AMPUTATION  . AMPUTATION Left 02/23/2016   Procedure: Left Below Knee Amputation;  Surgeon: [name], MD;  Location: [place];  Service: Orthopedics;  Laterality: Left;  . APPENDECTOMY    . BASAL CELL CARCINOMA EXCISION  2/16  left forearm  . CARDIAC CATHETERIZATION  1998   Negative  . CATARACT EXTRACTION Right 2017   then lid surgery  . CORONARY ARTERY BYPASS GRAFT  09/2005   Post op AFIB  . EYE SURGERY    . FOOT BONE EXCISION Left 11/03/2013   DR DUDA   . I&D EXTREMITY Left 11/03/2013   Procedure: Left Foot Partial Bone Excision Cuboid and Medial Cuneiform, Wound Closures;  Surgeon: [name], MD;  Location: [place];  Service: Orthopedics;  Laterality: Left;  . INSERTION PROSTATE RADIATION SEED  2009   RT and seeds for prostate cancer  . KIDNEY STONE SURGERY  04/1993  . RCA stents  04/2003   EF 55%  . RETINAL DETACHMENT SURGERY  2002-2003  . thrombosed vein  1993   Right leg   Social History   Occupational History  . Occupation: Heavy equipment mechanic--retired  Tobacco Use  . Smoking status: Former Smoker    Packs/day: 2.00    Years: 35.00    Pack years: 70.00    Types: Cigars, Cigarettes    Last attempt to quit: 01/07/2001    Years since quitting: 16.8  . Smokeless tobacco: Never Used  Substance and Sexual Activity  . Alcohol use: Yes    Comment: rare  . Drug use: No  . Sexual activity: Yes     Partners: Female

## 2017-11-17 ENCOUNTER — Ambulatory Visit (INDEPENDENT_AMBULATORY_CARE_PROVIDER_SITE_OTHER): Payer: Medicare Other | Admitting: Physician Assistant

## 2017-11-17 ENCOUNTER — Encounter (INDEPENDENT_AMBULATORY_CARE_PROVIDER_SITE_OTHER): Payer: Self-pay | Admitting: Orthopedic Surgery

## 2017-11-17 ENCOUNTER — Other Ambulatory Visit: Payer: Self-pay | Admitting: Internal Medicine

## 2017-11-17 VITALS — Ht 74.0 in | Wt 290.0 lb

## 2017-11-17 DIAGNOSIS — I87331 Chronic venous hypertension (idiopathic) with ulcer and inflammation of right lower extremity: Secondary | ICD-10-CM

## 2017-11-17 DIAGNOSIS — I2510 Atherosclerotic heart disease of native coronary artery without angina pectoris: Secondary | ICD-10-CM

## 2017-11-17 DIAGNOSIS — L97919 Non-pressure chronic ulcer of unspecified part of right lower leg with unspecified severity: Secondary | ICD-10-CM

## 2017-11-17 DIAGNOSIS — Z794 Long term (current) use of insulin: Secondary | ICD-10-CM | POA: Diagnosis not present

## 2017-11-17 DIAGNOSIS — L97221 Non-pressure chronic ulcer of left calf limited to breakdown of skin: Secondary | ICD-10-CM

## 2017-11-17 DIAGNOSIS — Z89512 Acquired absence of left leg below knee: Secondary | ICD-10-CM

## 2017-11-17 DIAGNOSIS — E1142 Type 2 diabetes mellitus with diabetic polyneuropathy: Secondary | ICD-10-CM | POA: Diagnosis not present

## 2017-11-19 ENCOUNTER — Encounter (INDEPENDENT_AMBULATORY_CARE_PROVIDER_SITE_OTHER): Payer: Self-pay | Admitting: Physician Assistant

## 2017-11-19 NOTE — Progress Notes (Signed)
Office Visit Note   Patient: [name]           Date of Birth: Sep 12, 1950           MRN: [id number] Visit Date: 11/17/2017              Requested by: [name], MD [id number] [place], [place] [address] PCP: [name], MD  Chief Complaint  Patient presents with  . Right Leg - Wound Check      HPI: The patient is a 67 year old male here for follow-up of his left transtibial amputation ulcer as well as ulcers of his right lower extremity including ulcers of his right calf and an ulcer over the dorsum of his fifth toe on the right.  His layered compression wraps stayed up better over the past week.  He has started some protein supplementation.  He reports improvement in his pain with the compression wrapping.    Assessment & Plan: Visit Diagnoses:  1. Non-pressure chronic ulcer of left calf, limited to breakdown of skin ([place])   2. Type 2 diabetes mellitus with diabetic polyneuropathy, with long-term current use of insulin ([place])   3. Idiopathic chronic venous hypertension of right lower extremity with ulcer and inflammation (HCC)   4. History of left below knee amputation ([place])     Plan: Improvement noted following application of a paste and Dynaflex compression wraps and we will continue these over the next week.  We are going to use some silver alginate to the wound beds themselves.  The Unna paste and Dynaflex wraps will be put on both of the lower extremities.  Recommend continued elevation.  Protein supplementation.  Follow-up in 1 week.  Follow-Up Instructions: Return in about 1 week (around 11/24/2017).   Ortho Exam  Patient is alert, oriented, no adenopathy, well-dressed, normal affect, normal respiratory effort. The right lower extremity calf ulcers show improvements with the compression wraps.  He does have some periwound irritation and maceration from drainage and we are going to use some silver nitrate with this compression wrap.  There are  no signs of extensive cellulitis or infection. The left transtibial amputation ulcers x3 are improved.  The proximal wound over the tibial tuberosity is stable, the wound over the distal stump has nearly resolved and the medial wound is improving as well.  There are no signs of cellulitis or infection.  His edema in both lower extremities is markedly improved. Imaging: No results found.   Labs: Lab Results  Component Value Date   HGBA1C 7.3 (A) 08/05/2017   HGBA1C 6.3 03/06/2017   HGBA1C 5.7 11/05/2016   ESRSEDRATE 35 (H) 03/31/2013   CRP 5.3 (H) 03/31/2013   REPTSTATUS 11/03/2016 FINAL 11/01/2016   GRAMSTAIN  12/30/2012    FEW WBC PRESENT, PREDOMINANTLY PMN RARE SQUAMOUS EPITHELIAL CELLS PRESENT ABUNDANT GRAM NEGATIVE RODS MODERATE GRAM POSITIVE COCCI IN PAIRS IN CLUSTERS   CULT  11/01/2016    NO GROWTH Performed at [facility], [place] [address], [place], [place] [address]    [place] OXYTOCA 12/15/2012   LABORGA PSEUDOMONAS AERUGINOSA 12/15/2012     Lab Results  Component Value Date   ALBUMIN 4.1 02/19/2017   ALBUMIN 4.4 11/01/2016   ALBUMIN 4.3 02/14/2016    Body mass index is 37.23 kg/m.  Orders:  No orders of the defined types were placed in this encounter.  No orders of the defined types were placed in this encounter.  Procedures: No procedures performed  Clinical Data: No additional findings.  ROS:  All other systems negative, except as noted in the HPI. Review of Systems  Objective: Vital Signs: Ht 6\' 2"  (1.88 m)   Wt 290 lb (131.5 kg)   BMI 37.23 kg/m   Specialty Comments:  No specialty comments available.  PMFS History: Patient Active Problem List   Diagnosis Date Noted  . PAD (peripheral artery disease) ([place]) 02/19/2017  . Chronic renal disease, stage III ([place]) 02/19/2017  . Morbid obesity ([place]) 11/05/2016  . Ulcer of toe of right foot, limited to breakdown of skin ([place]) 10/11/2016  . Non-pressure chronic ulcer of  left calf, limited to breakdown of skin ([place]) 06/04/2016  . Idiopathic chronic venous hypertension of right lower extremity with ulcer and inflammation ([place]) 06/04/2016  . Left below-knee amputee ([place]) 02/23/2016  . Type 2 diabetes mellitus with diabetic polyneuropathy, with long-term current use of insulin ([place]) 03/29/2015  . Advance directive discussed with patient 02/07/2014  . Normocytic anemia 04/01/2013  . Diabetic Charcot foot ([place]) 04/01/2013  . Routine general medical examination at a health care facility 12/25/2010  . HEMORRHOIDS-INTERNAL 11/22/2009  . PERSONAL HX COLONIC POLYPS 11/22/2009  . Venous (peripheral) insufficiency 06/29/2008  . Osteoarthritis, multiple sites 04/03/2007  . ERECTILE DYSFUNCTION, ORGANIC 06/13/2006  . Hyperlipemia 06/11/2006  . GLAUCOMA 06/11/2006  . Coronary atherosclerosis of native coronary artery 06/11/2006  . Obstructive sleep apnea 06/11/2006   Past Medical History:  Diagnosis Date  . Coronary atherosclerosis of unspecified type of vessel, native or graft   . Diabetes mellitus without complication ([place])   . Hemorrhage of rectum and anus   . History of kidney stones   . Impotence of organic origin   . Internal hemorrhoids without mention of complication   . Malignant neoplasm of prostate ([place])   . Mononeuritis of unspecified site   . Osteoarthrosis, unspecified whether generalized or localized, unspecified site   . Other and unspecified hyperlipidemia   . Personal history of colonic polyps   . Personal history of diabetic foot ulcer    saw wound center, resolved 05/08/2010  . Personal history of gallstones   . Routine general medical examination at a health care facility   . Type II or unspecified type diabetes mellitus with neurological manifestations, not stated as uncontrolled(250.60)   . Unspecified glaucoma(365.9)   . Unspecified sleep apnea    cpcp  . Unspecified venous (peripheral) insufficiency     Family History  Problem  Relation Age of Onset  . Lung cancer Father   . Multiple sclerosis Mother   . Heart attack Unknown        paternal aunts and uncles  . Peripheral vascular disease Maternal Grandfather        several amputations    Past Surgical History:  Procedure Laterality Date  . AMPUTATION Left 12/30/2012   Procedure: AMPUTATION RAY ;  Surgeon: Meredith Pel, MD;  [facility];  Service: Orthopedics;  Laterality: Left;  LEFT GREAT TOE RAY AMPUTATION  . AMPUTATION Left 02/23/2016   Procedure: Left Below Knee Amputation;  Surgeon: [name], MD;  Location: [place];  Service: Orthopedics;  Laterality: Left;  . APPENDECTOMY    . BASAL CELL CARCINOMA EXCISION  2/16   left forearm  . CARDIAC CATHETERIZATION  1998   Negative  . CATARACT EXTRACTION Right 2017   then lid surgery  . CORONARY ARTERY BYPASS GRAFT  09/2005   Post op AFIB  . EYE  SURGERY    . FOOT BONE EXCISION Left 11/03/2013   DR DUDA   . I&D EXTREMITY Left 11/03/2013   Procedure: Left Foot Partial Bone Excision Cuboid and Medial Cuneiform, Wound Closures;  Surgeon: [name], MD;  Location: [place];  Service: Orthopedics;  Laterality: Left;  . INSERTION PROSTATE RADIATION SEED  2009   RT and seeds for prostate cancer  . KIDNEY STONE SURGERY  04/1993  . RCA stents  04/2003   EF 55%  . RETINAL DETACHMENT SURGERY  2002-2003  . thrombosed vein  1993   Right leg   Social History   Occupational History  . Occupation: Heavy equipment mechanic--retired  Tobacco Use  . Smoking status: Former Smoker    Packs/day: 2.00    Years: 35.00    Pack years: 70.00    Types: Cigars, Cigarettes    Last attempt to quit: 01/07/2001    Years since quitting: 16.8  . Smokeless tobacco: Never Used  Substance and Sexual Activity  . Alcohol use: Yes    Comment: rare  . Drug use: No  . Sexual activity: Yes    Partners: Female

## 2017-11-24 ENCOUNTER — Ambulatory Visit (INDEPENDENT_AMBULATORY_CARE_PROVIDER_SITE_OTHER): Payer: Medicare Other | Admitting: Physician Assistant

## 2017-11-24 ENCOUNTER — Encounter (INDEPENDENT_AMBULATORY_CARE_PROVIDER_SITE_OTHER): Payer: Self-pay | Admitting: Physician Assistant

## 2017-11-24 VITALS — Ht 74.0 in | Wt 290.0 lb

## 2017-11-24 DIAGNOSIS — Z89512 Acquired absence of left leg below knee: Secondary | ICD-10-CM

## 2017-11-24 DIAGNOSIS — Z794 Long term (current) use of insulin: Secondary | ICD-10-CM | POA: Diagnosis not present

## 2017-11-24 DIAGNOSIS — E1142 Type 2 diabetes mellitus with diabetic polyneuropathy: Secondary | ICD-10-CM | POA: Diagnosis not present

## 2017-11-24 DIAGNOSIS — I87331 Chronic venous hypertension (idiopathic) with ulcer and inflammation of right lower extremity: Secondary | ICD-10-CM | POA: Diagnosis not present

## 2017-11-24 DIAGNOSIS — L97919 Non-pressure chronic ulcer of unspecified part of right lower leg with unspecified severity: Secondary | ICD-10-CM | POA: Diagnosis not present

## 2017-11-24 DIAGNOSIS — L97221 Non-pressure chronic ulcer of left calf limited to breakdown of skin: Secondary | ICD-10-CM

## 2017-11-24 MED ORDER — DOXYCYCLINE HYCLATE 100 MG PO TABS: 100.0000 mg | ORAL_TABLET | Freq: Two times a day (BID) | ORAL | 0 refills | Status: DC

## 2017-11-24 NOTE — Progress Notes (Signed)
Office Visit Note   Patient: [name]           Date of Birth: [birth year]           MRN: [id number] Visit Date: 11/24/2017              Requested by: [name], MD [address] [place], [place] [address] PCP: [name], MD  Chief Complaint  Patient presents with  . Right Leg - Follow-up  . Left Leg - Follow-up    Left BKA bilateral unna dynaflex dressings.       HPI: The patient is a 67 year old male who is seen for follow-up for his ulcerations of his left transtibial amputation and right lower extremity ulcers and right fifth toe ulcers.  We have been treating him with Unna boots and Dynaflex layered compression and he and his wife report more odor coming from the wounds over the past 3 days or so.  He reports some burning discomfort over the right great toe.  Assessment & Plan: Visit Diagnoses:  1. Left below-knee amputee ([place])   2. Idiopathic chronic venous hypertension of right lower extremity with ulcer and inflammation (HCC)   3. Type 2 diabetes mellitus with diabetic polyneuropathy, with long-term current use of insulin ([place])   4. Non-pressure chronic ulcer of left calf, limited to breakdown of skin ([place])     Plan: We are going to begin doxycycline 100 mg p.o. twice daily for the next 14 days.  Also recommend he resume showering using Dial soap and dry the ulcers of both lower legs then apply Silvadene cream 4 x 4 gauze and Ace wrapping.  Also recommend he continue protein supplementation.  Recommend he continue strict elevation is much as possible over the next several days with his lower legs higher than the level of his heart.  He will follow-up on Thursday or sooner should he have further difficulties in the interim.  Follow-Up Instructions: Return in about 3 days (around 11/27/2017).   Ortho Exam  Patient is alert, oriented, no adenopathy, well-dressed, normal affect, normal respiratory effort. The right lower extremity wrap had slid  down a little bit and he has some increased superficial skin breakdown in a circumferential pattern where the wrap was.  The right fifth toe blister has opened and there is some drainage but the tissue underneath is pink and there are no signs of cellulitis currently.  He has good pedal pulses in the right lower extremity. The left residual limb anterior stump ulcer has increased slough and gray-black necrotic tissue which was sharply debrided.  The distal stump wound appears stable.  There is some new superficial breakdown where the wrap terminated over the proximal stump under the knee as well as several reddened pustule appearing areas over the left residual limb.  There is increased edema of the distal stump with some erythema.  Imaging: No results found. No images are attached to the encounter.  Labs: Lab Results  Component Value Date   HGBA1C 7.3 (A) 08/05/2017   HGBA1C 6.3 03/06/2017   HGBA1C 5.7 11/05/2016   ESRSEDRATE 35 (H) 03/31/2013   CRP 5.3 (H) 03/31/2013   REPTSTATUS 11/03/2016 FINAL 11/01/2016   GRAMSTAIN  12/30/2012    FEW WBC PRESENT, PREDOMINANTLY PMN RARE SQUAMOUS EPITHELIAL CELLS PRESENT ABUNDANT GRAM NEGATIVE RODS MODERATE GRAM POSITIVE COCCI IN PAIRS IN CLUSTERS   CULT  11/01/2016    NO GROWTH Performed at [facility], [place]  [address] [place],  [address]    [place] OXYTOCA 12/15/2012   LABORGA PSEUDOMONAS AERUGINOSA 12/15/2012     Lab Results  Component Value Date   ALBUMIN 4.1 02/19/2017   ALBUMIN 4.4 11/01/2016   ALBUMIN 4.3 02/14/2016    Body mass index is 37.23 kg/m.  Orders:  No orders of the defined types were placed in this encounter.  Meds ordered this encounter  Medications  . doxycycline (VIBRA-TABS) 100 MG tablet    Sig: Take 1 tablet (100 mg total) by mouth 2 (two) times daily.    Dispense:  30 tablet    Refill:  0     Procedures: No procedures performed  Clinical Data: No additional  findings.  ROS:  All other systems negative, except as noted in the HPI. Review of Systems  Objective: Vital Signs: Ht 6\' 2"  (1.88 m)   Wt 290 lb (131.5 kg)   BMI 37.23 kg/m   Specialty Comments:  No specialty comments available.  PMFS History: Patient Active Problem List   Diagnosis Date Noted  . PAD (peripheral artery disease) ([place]) 02/19/2017  . Chronic renal disease, stage III ([place]) 02/19/2017  . Morbid obesity ([place]) 11/05/2016  . Ulcer of toe of right foot, limited to breakdown of skin ([place]) 10/11/2016  . Non-pressure chronic ulcer of left calf, limited to breakdown of skin ([place]) 06/04/2016  . Idiopathic chronic venous hypertension of right lower extremity with ulcer and inflammation ([place]) 06/04/2016  . Left below-knee amputee ([place]) 02/23/2016  . Type 2 diabetes mellitus with diabetic polyneuropathy, with long-term current use of insulin ([place]) 03/29/2015  . Advance directive discussed with patient 02/07/2014  . Normocytic anemia 04/01/2013  . Diabetic Charcot foot ([place]) 04/01/2013  . Routine general medical examination at a health care facility 12/25/2010  . HEMORRHOIDS-INTERNAL 11/22/2009  . PERSONAL HX COLONIC POLYPS 11/22/2009  . Venous (peripheral) insufficiency 06/29/2008  . Osteoarthritis, multiple sites 04/03/2007  . ERECTILE DYSFUNCTION, ORGANIC 06/13/2006  . Hyperlipemia 06/11/2006  . GLAUCOMA 06/11/2006  . Coronary atherosclerosis of native coronary artery 06/11/2006  . Obstructive sleep apnea 06/11/2006   Past Medical History:  Diagnosis Date  . Coronary atherosclerosis of unspecified type of vessel, native or graft   . Diabetes mellitus without complication ([place])   . Hemorrhage of rectum and anus   . History of kidney stones   . Impotence of organic origin   . Internal hemorrhoids without mention of complication   . Malignant neoplasm of prostate ([place])   . Mononeuritis of unspecified site   . Osteoarthrosis, unspecified whether generalized or  localized, unspecified site   . Other and unspecified hyperlipidemia   . Personal history of colonic polyps   . Personal history of diabetic foot ulcer    saw wound center, resolved 05/08/2010  . Personal history of gallstones   . Routine general medical examination at a health care facility   . Type II or unspecified type diabetes mellitus with neurological manifestations, not stated as uncontrolled(250.60)   . Unspecified glaucoma(365.9)   . Unspecified sleep apnea    cpcp  . Unspecified venous (peripheral) insufficiency     Family History  Problem Relation Age of Onset  . Lung cancer Father   . Multiple sclerosis Mother   . Heart attack Unknown        paternal aunts and uncles  . Peripheral vascular disease Maternal Grandfather        several amputations    Past Surgical History:  Procedure Laterality Date  .  AMPUTATION Left 12/30/2012   Procedure: AMPUTATION RAY ;  Surgeon: Meredith Pel, MD;  [facility];  Service: Orthopedics;  Laterality: Left;  LEFT GREAT TOE RAY AMPUTATION  . AMPUTATION Left 02/23/2016   Procedure: Left Below Knee Amputation;  Surgeon: [name], MD;  Location: [facility];  Service: Orthopedics;  Laterality: Left;  . APPENDECTOMY    . BASAL CELL CARCINOMA EXCISION  2/16   left forearm  . CARDIAC CATHETERIZATION  1998   Negative  . CATARACT EXTRACTION Right 2017   then lid surgery  . CORONARY ARTERY BYPASS GRAFT  09/2005   Post op AFIB  . EYE SURGERY    . FOOT BONE EXCISION Left 11/03/2013   DR DUDA   . I&D EXTREMITY Left 11/03/2013   Procedure: Left Foot Partial Bone Excision Cuboid and Medial Cuneiform, Wound Closures;  Surgeon: [name], MD;  Location: [facility];  Service: Orthopedics;  Laterality: Left;  . INSERTION PROSTATE RADIATION SEED  2009   RT and seeds for prostate cancer  . KIDNEY STONE SURGERY  04/1993  . RCA stents  04/2003   EF 55%  . RETINAL DETACHMENT SURGERY  2002-2003  . thrombosed vein  1993   Right leg   Social  History   Occupational History  . Occupation: Heavy equipment mechanic--retired  Tobacco Use  . Smoking status: Former Smoker    Packs/day: 2.00    Years: 35.00    Pack years: 70.00    Types: Cigars, Cigarettes    Last attempt to quit: 01/07/2001    Years since quitting: 16.8  . Smokeless tobacco: Never Used  Substance and Sexual Activity  . Alcohol use: Yes    Comment: rare  . Drug use: No  . Sexual activity: Yes    Partners: Female

## 2017-11-27 ENCOUNTER — Encounter (INDEPENDENT_AMBULATORY_CARE_PROVIDER_SITE_OTHER): Payer: Self-pay | Admitting: Orthopedic Surgery

## 2017-11-27 ENCOUNTER — Ambulatory Visit (INDEPENDENT_AMBULATORY_CARE_PROVIDER_SITE_OTHER): Payer: Medicare Other | Admitting: Physician Assistant

## 2017-11-27 VITALS — Ht 74.0 in | Wt 290.0 lb

## 2017-11-27 DIAGNOSIS — Z794 Long term (current) use of insulin: Secondary | ICD-10-CM | POA: Diagnosis not present

## 2017-11-27 DIAGNOSIS — E1142 Type 2 diabetes mellitus with diabetic polyneuropathy: Secondary | ICD-10-CM

## 2017-11-27 DIAGNOSIS — L97919 Non-pressure chronic ulcer of unspecified part of right lower leg with unspecified severity: Secondary | ICD-10-CM | POA: Diagnosis not present

## 2017-11-27 DIAGNOSIS — Z89512 Acquired absence of left leg below knee: Secondary | ICD-10-CM

## 2017-11-27 DIAGNOSIS — I87331 Chronic venous hypertension (idiopathic) with ulcer and inflammation of right lower extremity: Secondary | ICD-10-CM | POA: Diagnosis not present

## 2017-11-27 DIAGNOSIS — I2510 Atherosclerotic heart disease of native coronary artery without angina pectoris: Secondary | ICD-10-CM

## 2017-11-27 NOTE — Progress Notes (Signed)
Office Visit Note   Patient: [name]           Date of Birth: Apr 13, 1950           MRN: [id number] Visit Date: 11/27/2017              Requested by: [name], MD [address] [place], [place] [address] PCP: [name], MD  Chief Complaint  Patient presents with  . Right Foot - Follow-up    5th toe ulcer  . Left Leg - Follow-up    BKA ulcer      HPI: The patient is a 67 yo male who is seen for follow up of ulcers of his left transtibial amputation and right lower extremity ulcers and right fifth toe ulcer.  He was started on doxycycline 100 mg twice daily earlier in the week and is using Silvadene for his dressing changes daily.  He has been utilizing Dial soap and water when showering and getting the areas wet daily.  Overall they were looking better and he reports they are significantly less painful.  He is having some bloody drainage.  Assessment & Plan: Visit Diagnoses:  1. Left below-knee amputee ([place])   2. Idiopathic chronic venous hypertension of right lower extremity with ulcer and inflammation (HCC)   3. Type 2 diabetes mellitus with diabetic polyneuropathy, with long-term current use of insulin ([place])     Plan: He is can continue on doxycycline 100 mg p.o. twice daily.  Continue Silvadene to the right lower leg ulcers over the anterior calf and the right fifth toe daily after showering with Dial soap and cover with gauze and Ace wrapping.  We are to start some Iodosorb to the left anterior transtibial amputation ulcers daily after cleaning with Dial soap and water.  He will continue to utilize gauze and Ace wrap to the below the knee amputation for compression daily.  Continue to elevate the extremities as much as possible, continue protein supplementation.  He is also going to start a multivitamin as well as vitamin C 500 mg p.o. twice daily and zinc tablets 1 twice daily.  He is can follow-up here next Tuesday.  Follow-Up Instructions: Return  in about 5 days (around 12/02/2017).   Ortho Exam  Patient is alert, oriented, no adenopathy, well-dressed, normal affect, normal respiratory effort. The right lower extremity anterior ulceration is smaller and now very superficial.  There are no signs of infection.  The right fifth toe ulcer is improved with improved pink tissue and does not track any deeper. The left anterior tibial wound has some bloody drainage still some yellow slough over the wound bed.  The distal wounds are smaller but he does have edema of the distal residual limb and some erythema but this is somewhat improved and he is less tender to palpation today.  Imaging: No results found. No images are attached to the encounter.  Labs: Lab Results  Component Value Date   HGBA1C 7.3 (A) 08/05/2017   HGBA1C 6.3 03/06/2017   HGBA1C 5.7 11/05/2016   ESRSEDRATE 35 (H) 03/31/2013   CRP 5.3 (H) 03/31/2013   REPTSTATUS 11/03/2016 FINAL 11/01/2016   GRAMSTAIN  12/30/2012    FEW WBC PRESENT, PREDOMINANTLY PMN RARE SQUAMOUS EPITHELIAL CELLS PRESENT ABUNDANT GRAM NEGATIVE RODS MODERATE GRAM POSITIVE COCCI IN PAIRS IN CLUSTERS   CULT  11/01/2016    NO GROWTH Performed at [facility], [place] [address] [place], [place] [address]  LABORGA KLEBSIELLA OXYTOCA 12/15/2012   LABORGA PSEUDOMONAS AERUGINOSA 12/15/2012     Lab Results  Component Value Date   ALBUMIN 4.1 02/19/2017   ALBUMIN 4.4 11/01/2016   ALBUMIN 4.3 02/14/2016    Body mass index is 37.23 kg/m.  Orders:  No orders of the defined types were placed in this encounter.  No orders of the defined types were placed in this encounter.    Procedures: No procedures performed  Clinical Data: No additional findings.  ROS:  All other systems negative, except as noted in the HPI. Review of Systems  Objective: Vital Signs: Ht 6\' 2"  (1.88 m)   Wt 290 lb (131.5 kg)   BMI 37.23 kg/m   Specialty Comments:  No specialty comments available.  PMFS  History: Patient Active Problem List   Diagnosis Date Noted  . PAD (peripheral artery disease) ([place]) 02/19/2017  . Chronic renal disease, stage III ([place]) 02/19/2017  . Morbid obesity ([place]) 11/05/2016  . Ulcer of toe of right foot, limited to breakdown of skin ([place]) 10/11/2016  . Non-pressure chronic ulcer of left calf, limited to breakdown of skin ([place]) 06/04/2016  . Idiopathic chronic venous hypertension of right lower extremity with ulcer and inflammation ([place]) 06/04/2016  . Left below-knee amputee ([place]) 02/23/2016  . Type 2 diabetes mellitus with diabetic polyneuropathy, with long-term current use of insulin ([place]) 03/29/2015  . Advance directive discussed with patient 02/07/2014  . Normocytic anemia 04/01/2013  . Diabetic Charcot foot ([place]) 04/01/2013  . Routine general medical examination at a health care facility 12/25/2010  . HEMORRHOIDS-INTERNAL 11/22/2009  . PERSONAL HX COLONIC POLYPS 11/22/2009  . Venous (peripheral) insufficiency 06/29/2008  . Osteoarthritis, multiple sites 04/03/2007  . ERECTILE DYSFUNCTION, ORGANIC 06/13/2006  . Hyperlipemia 06/11/2006  . GLAUCOMA 06/11/2006  . Coronary atherosclerosis of native coronary artery 06/11/2006  . Obstructive sleep apnea 06/11/2006   Past Medical History:  Diagnosis Date  . Coronary atherosclerosis of unspecified type of vessel, native or graft   . Diabetes mellitus without complication ([place])   . Hemorrhage of rectum and anus   . History of kidney stones   . Impotence of organic origin   . Internal hemorrhoids without mention of complication   . Malignant neoplasm of prostate ([place])   . Mononeuritis of unspecified site   . Osteoarthrosis, unspecified whether generalized or localized, unspecified site   . Other and unspecified hyperlipidemia   . Personal history of colonic polyps   . Personal history of diabetic foot ulcer    saw wound center, resolved 05/08/2010  . Personal history of gallstones   . Routine general medical  examination at a health care facility   . Type II or unspecified type diabetes mellitus with neurological manifestations, not stated as uncontrolled(250.60)   . Unspecified glaucoma(365.9)   . Unspecified sleep apnea    cpcp  . Unspecified venous (peripheral) insufficiency     Family History  Problem Relation Age of Onset  . Lung cancer Father   . Multiple sclerosis Mother   . Heart attack Unknown        paternal aunts and uncles  . Peripheral vascular disease Maternal Grandfather        several amputations    Past Surgical History:  Procedure Laterality Date  . AMPUTATION Left 12/30/2012   Procedure: AMPUTATION RAY ;  Surgeon: Meredith Pel, MD;  Location: [facility];  Service: Orthopedics;  Laterality: Left;  LEFT GREAT TOE RAY AMPUTATION  . AMPUTATION Left 02/23/2016   Procedure:  Left Below Knee Amputation;  Surgeon: [name], MD;  Location: [facility];  Service: Orthopedics;  Laterality: Left;  . APPENDECTOMY    . BASAL CELL CARCINOMA EXCISION  2/16   left forearm  . CARDIAC CATHETERIZATION  1998   Negative  . CATARACT EXTRACTION Right 2017   then lid surgery  . CORONARY ARTERY BYPASS GRAFT  09/2005   Post op AFIB  . EYE SURGERY    . FOOT BONE EXCISION Left 11/03/2013   DR DUDA   . I&D EXTREMITY Left 11/03/2013   Procedure: Left Foot Partial Bone Excision Cuboid and Medial Cuneiform, Wound Closures;  Surgeon: [name], MD;  Location: [facility];  Service: Orthopedics;  Laterality: Left;  . INSERTION PROSTATE RADIATION SEED  2009   RT and seeds for prostate cancer  . KIDNEY STONE SURGERY  04/1993  . RCA stents  04/2003   EF 55%  . RETINAL DETACHMENT SURGERY  2002-2003  . thrombosed vein  1993   Right leg   Social History   Occupational History  . Occupation: Heavy equipment mechanic--retired  Tobacco Use  . Smoking status: Former Smoker    Packs/day: 2.00    Years: 35.00    Pack years: 70.00    Types: Cigars, Cigarettes    Last attempt to quit: 01/07/2001     Years since quitting: 16.8  . Smokeless tobacco: Never Used  Substance and Sexual Activity  . Alcohol use: Yes    Comment: rare  . Drug use: No  . Sexual activity: Yes    Partners: Female

## 2017-12-02 ENCOUNTER — Ambulatory Visit (INDEPENDENT_AMBULATORY_CARE_PROVIDER_SITE_OTHER): Payer: Medicare Other | Admitting: Physician Assistant

## 2017-12-02 ENCOUNTER — Encounter (INDEPENDENT_AMBULATORY_CARE_PROVIDER_SITE_OTHER): Payer: Self-pay | Admitting: Physician Assistant

## 2017-12-02 VITALS — Ht 74.0 in | Wt 290.0 lb

## 2017-12-02 DIAGNOSIS — Z794 Long term (current) use of insulin: Secondary | ICD-10-CM

## 2017-12-02 DIAGNOSIS — E1142 Type 2 diabetes mellitus with diabetic polyneuropathy: Secondary | ICD-10-CM | POA: Diagnosis not present

## 2017-12-02 DIAGNOSIS — Z89512 Acquired absence of left leg below knee: Secondary | ICD-10-CM

## 2017-12-02 DIAGNOSIS — I87331 Chronic venous hypertension (idiopathic) with ulcer and inflammation of right lower extremity: Secondary | ICD-10-CM | POA: Diagnosis not present

## 2017-12-02 DIAGNOSIS — L97919 Non-pressure chronic ulcer of unspecified part of right lower leg with unspecified severity: Secondary | ICD-10-CM | POA: Diagnosis not present

## 2017-12-02 DIAGNOSIS — L97221 Non-pressure chronic ulcer of left calf limited to breakdown of skin: Secondary | ICD-10-CM

## 2017-12-02 NOTE — Progress Notes (Signed)
Office Visit Note   Patient: [name]           Date of Birth: 08-24-1950           MRN: [id number] Visit Date: 12/02/2017              Requested by: [name], MD [address] [place], [place] [address] PCP: [name], MD  Chief Complaint  Patient presents with  . Left Leg - Follow-up    BKA ulceration   . Right Foot - Follow-up    Ulcer 5th toe      HPI: The patient is a 67 year old male who is here for follow-up of his ulcers of his left transtibial amputation and right lower extremity calf and right fifth toe ulcers.  He has been on a course of doxycycline.  He is utilizing Silvadene to the right lower extremity and Iodosorb gel to the transtibial ulcers on the left.  They are looking markedly better today.  He is taking a protein supplement as well as a multivitamin, vitamin C and zinc on a daily basis.  Assessment & Plan: Visit Diagnoses:  1. Left below-knee amputee ([place])   2. Idiopathic chronic venous hypertension of right lower extremity with ulcer and inflammation (HCC)   3. Type 2 diabetes mellitus with diabetic polyneuropathy, with long-term current use of insulin ([place])   4. Non-pressure chronic ulcer of left calf, limited to breakdown of skin ([place])     Plan: He is going to continue showering daily with Dial soap to both extremities and then apply Silvadene cream to the right lower extremity and right fifth toe ulcers, and then Iodosorb gel to the transtibial amputation ulcers cover both areas with gauze and Ace wrap daily to twice daily if draining.  He is going to continue to elevate his extremities as much as possible.  Continue protein supplementation and vitamins as previously outlined.  He will follow-up in about 10 days.  Follow-Up Instructions: Return in about 10 days (around 12/12/2017).   Ortho Exam  Patient is alert, oriented, no adenopathy, well-dressed, normal affect, normal respiratory effort. The right lower extremity ulcer  over the anterior calf is markedly improved and nearly healed.  The right fifth toe dorsal ulcer has continued to improve and has excellent pink granulation no signs of infection or cellulitis.  His edema remains much improved. The left transtibial anterior ulcer over the distal tibia is much improved today.  There is less drainage, improved granulation and decreased slough.  The distal residual limb ulcer x2 have nearly healed and have excellent granulation.  The erythema of the distal residual limb is improved as is the edema today.  Imaging: No results found. No images are attached to the encounter.  Labs: Lab Results  Component Value Date   HGBA1C 7.3 (A) 08/05/2017   HGBA1C 6.3 03/06/2017   HGBA1C 5.7 11/05/2016   ESRSEDRATE 35 (H) 03/31/2013   CRP 5.3 (H) 03/31/2013   REPTSTATUS 11/03/2016 FINAL 11/01/2016   GRAMSTAIN  12/30/2012    FEW WBC PRESENT, PREDOMINANTLY PMN RARE SQUAMOUS EPITHELIAL CELLS PRESENT ABUNDANT GRAM NEGATIVE RODS MODERATE GRAM POSITIVE COCCI IN PAIRS IN CLUSTERS   CULT  11/01/2016    NO GROWTH Performed at [facility], [place] [address] [place], [place] [address]    LABORGA KLEBSIELLA OXYTOCA 12/15/2012   LABORGA PSEUDOMONAS AERUGINOSA 12/15/2012     Lab Results  Component Value Date   ALBUMIN 4.1 02/19/2017  ALBUMIN 4.4 11/01/2016   ALBUMIN 4.3 02/14/2016    Body mass index is 37.23 kg/m.  Orders:  No orders of the defined types were placed in this encounter.  No orders of the defined types were placed in this encounter.    Procedures: No procedures performed  Clinical Data: No additional findings.  ROS:  All other systems negative, except as noted in the HPI. Review of Systems  Objective: Vital Signs: Ht 6\' 2"  (1.88 m)   Wt 290 lb (131.5 kg)   BMI 37.23 kg/m   Specialty Comments:  No specialty comments available.  PMFS History: Patient Active Problem List   Diagnosis Date Noted  . PAD (peripheral artery disease)  ([place]) 02/19/2017  . Chronic renal disease, stage III ([place]) 02/19/2017  . Morbid obesity ([place]) 11/05/2016  . Ulcer of toe of right foot, limited to breakdown of skin ([place]) 10/11/2016  . Non-pressure chronic ulcer of left calf, limited to breakdown of skin ([place]) 06/04/2016  . Idiopathic chronic venous hypertension of right lower extremity with ulcer and inflammation ([place]) 06/04/2016  . Left below-knee amputee ([place]) 02/23/2016  . Type 2 diabetes mellitus with diabetic polyneuropathy, with long-term current use of insulin ([place]) 03/29/2015  . Advance directive discussed with patient 02/07/2014  . Normocytic anemia 04/01/2013  . Diabetic Charcot foot ([place]) 04/01/2013  . Routine general medical examination at a health care facility 12/25/2010  . HEMORRHOIDS-INTERNAL 11/22/2009  . PERSONAL HX COLONIC POLYPS 11/22/2009  . Venous (peripheral) insufficiency 06/29/2008  . Osteoarthritis, multiple sites 04/03/2007  . ERECTILE DYSFUNCTION, ORGANIC 06/13/2006  . Hyperlipemia 06/11/2006  . GLAUCOMA 06/11/2006  . Coronary atherosclerosis of native coronary artery 06/11/2006  . Obstructive sleep apnea 06/11/2006   Past Medical History:  Diagnosis Date  . Coronary atherosclerosis of unspecified type of vessel, native or graft   . Diabetes mellitus without complication ([place])   . Hemorrhage of rectum and anus   . History of kidney stones   . Impotence of organic origin   . Internal hemorrhoids without mention of complication   . Malignant neoplasm of prostate ([place])   . Mononeuritis of unspecified site   . Osteoarthrosis, unspecified whether generalized or localized, unspecified site   . Other and unspecified hyperlipidemia   . Personal history of colonic polyps   . Personal history of diabetic foot ulcer    saw wound center, resolved 05/08/2010  . Personal history of gallstones   . Routine general medical examination at a health care facility   . Type II or unspecified type diabetes mellitus with  neurological manifestations, not stated as uncontrolled(250.60)   . Unspecified glaucoma(365.9)   . Unspecified sleep apnea    cpcp  . Unspecified venous (peripheral) insufficiency     Family History  Problem Relation Age of Onset  . Lung cancer Father   . Multiple sclerosis Mother   . Heart attack Unknown        paternal aunts and uncles  . Peripheral vascular disease Maternal Grandfather        several amputations    Past Surgical History:  Procedure Laterality Date  . AMPUTATION Left 12/30/2012   Procedure: AMPUTATION RAY ;  Surgeon: Meredith Pel, MD;  [facility];  Service: Orthopedics;  Laterality: Left;  LEFT GREAT TOE RAY AMPUTATION  . AMPUTATION Left 02/23/2016   Procedure: Left Below Knee Amputation;  Surgeon: [name], MD;  Location: [facility];  Service: Orthopedics;  Laterality: Left;  . APPENDECTOMY    .  BASAL CELL CARCINOMA EXCISION  2/16   left forearm  . CARDIAC CATHETERIZATION  1998   Negative  . CATARACT EXTRACTION Right 2017   then lid surgery  . CORONARY ARTERY BYPASS GRAFT  09/2005   Post op AFIB  . EYE SURGERY    . FOOT BONE EXCISION Left 11/03/2013   DR DUDA   . I&D EXTREMITY Left 11/03/2013   Procedure: Left Foot Partial Bone Excision Cuboid and Medial Cuneiform, Wound Closures;  Surgeon: [name], MD;  Location: [facility];  Service: Orthopedics;  Laterality: Left;  . INSERTION PROSTATE RADIATION SEED  2009   RT and seeds for prostate cancer  . KIDNEY STONE SURGERY  04/1993  . RCA stents  04/2003   EF 55%  . RETINAL DETACHMENT SURGERY  2002-2003  . thrombosed vein  1993   Right leg   Social History   Occupational History  . Occupation: Heavy equipment mechanic--retired  Tobacco Use  . Smoking status: Former Smoker    Packs/day: 2.00    Years: 35.00    Pack years: 70.00    Types: Cigars, Cigarettes    Last attempt to quit: 01/07/2001    Years since quitting: 16.9  . Smokeless tobacco: Never Used  Substance and Sexual Activity  .  Alcohol use: Yes    Comment: rare  . Drug use: No  . Sexual activity: Yes    Partners: Female

## 2017-12-07 ENCOUNTER — Other Ambulatory Visit (INDEPENDENT_AMBULATORY_CARE_PROVIDER_SITE_OTHER): Payer: Self-pay | Admitting: Family

## 2017-12-11 ENCOUNTER — Encounter (INDEPENDENT_AMBULATORY_CARE_PROVIDER_SITE_OTHER): Payer: Self-pay | Admitting: Physician Assistant

## 2017-12-11 ENCOUNTER — Ambulatory Visit (INDEPENDENT_AMBULATORY_CARE_PROVIDER_SITE_OTHER): Payer: Medicare Other | Admitting: Physician Assistant

## 2017-12-11 VITALS — Ht 74.0 in | Wt 290.0 lb

## 2017-12-11 DIAGNOSIS — I87331 Chronic venous hypertension (idiopathic) with ulcer and inflammation of right lower extremity: Secondary | ICD-10-CM | POA: Diagnosis not present

## 2017-12-11 DIAGNOSIS — Z89512 Acquired absence of left leg below knee: Secondary | ICD-10-CM | POA: Diagnosis not present

## 2017-12-11 DIAGNOSIS — E1142 Type 2 diabetes mellitus with diabetic polyneuropathy: Secondary | ICD-10-CM

## 2017-12-11 DIAGNOSIS — L97221 Non-pressure chronic ulcer of left calf limited to breakdown of skin: Secondary | ICD-10-CM

## 2017-12-11 DIAGNOSIS — Z794 Long term (current) use of insulin: Secondary | ICD-10-CM

## 2017-12-11 DIAGNOSIS — L97919 Non-pressure chronic ulcer of unspecified part of right lower leg with unspecified severity: Secondary | ICD-10-CM

## 2017-12-11 NOTE — Progress Notes (Signed)
Office Visit Note   Patient: [name]           Date of Birth: 1950/07/03           MRN: [id number] Visit Date: 12/11/2017              Requested by: [name], MD [address] [place], [place] [address] PCP: [name], MD  Chief Complaint  Patient presents with  . Left Leg - Routine Post Op    BKA      HPI: The patient is a 67 yo male here for follow up of his ulcers of his left transtibial amputation and right lower extremity calf and fifth toe ulcers. He has completed a course of Doxycycline. He has been using iodosorb gel to the open areas and they are continuing to improve. He is feeling much better and taking protein supplements and vitamins.   Assessment & Plan: Visit Diagnoses:  1. Left below-knee amputee ([place])   2. Idiopathic chronic venous hypertension of right lower extremity with ulcer and inflammation (HCC)   3. Type 2 diabetes mellitus with diabetic polyneuropathy, with long-term current use of insulin ([place])   4. Non-pressure chronic ulcer of left calf, limited to breakdown of skin (HCC)     Plan: Continue iodosorb gel to the open areas daily after showering with dial soap. He is using a vive stump shrinker stocking to the left transtibial amputation and is going to obtain a larger 2 XL vive silver compression sock to the right leg daily except for his showers. Follow up in 2 weeks.   Follow-Up Instructions: Return in about 2 weeks (around 12/25/2017).   Ortho Exam  Patient is alert, oriented, no adenopathy, well-dressed, normal affect, normal respiratory effort. The right foot ulcer is much improved and granulating in well. No signs of cellulitis or infection. There is edema of the right calf> foot and we recommended he wear a silver compression sock.  The ulcers of the distal residual limb are all improving, the distal most 2 ulcers have nearly healed. The ulcer over the tibia continues to improve with increased pink granulation and  coverage over the tibia. No signs of infection or cellulitis.   Imaging: No results found.    Labs: Lab Results  Component Value Date   HGBA1C 7.3 (A) 08/05/2017   HGBA1C 6.3 03/06/2017   HGBA1C 5.7 11/05/2016   ESRSEDRATE 35 (H) 03/31/2013   CRP 5.3 (H) 03/31/2013   REPTSTATUS 11/03/2016 FINAL 11/01/2016   GRAMSTAIN  12/30/2012    FEW WBC PRESENT, PREDOMINANTLY PMN RARE SQUAMOUS EPITHELIAL CELLS PRESENT ABUNDANT GRAM NEGATIVE RODS MODERATE GRAM POSITIVE COCCI IN PAIRS IN CLUSTERS   CULT  11/01/2016    NO GROWTH Performed at [facility], [place] [address] [place], [place] [address]    [place] OXYTOCA 12/15/2012   LABORGA PSEUDOMONAS AERUGINOSA 12/15/2012     Lab Results  Component Value Date   ALBUMIN 4.1 02/19/2017   ALBUMIN 4.4 11/01/2016   ALBUMIN 4.3 02/14/2016    Body mass index is 37.23 kg/m.  Orders:  No orders of the defined types were placed in this encounter.  No orders of the defined types were placed in this encounter.    Procedures: No procedures performed  Clinical Data: No additional findings.  ROS:  All other systems negative, except as noted in the HPI. Review of Systems  Objective: Vital Signs: Ht 6\' 2"  (4.19 m)   Wt 290  lb (131.5 kg)   BMI 37.23 kg/m   Specialty Comments:  No specialty comments available.  PMFS History: Patient Active Problem List   Diagnosis Date Noted  . PAD (peripheral artery disease) ([place]) 02/19/2017  . Chronic renal disease, stage III ([place]) 02/19/2017  . Morbid obesity ([place]) 11/05/2016  . Ulcer of toe of right foot, limited to breakdown of skin ([place]) 10/11/2016  . Non-pressure chronic ulcer of left calf, limited to breakdown of skin ([place]) 06/04/2016  . Idiopathic chronic venous hypertension of right lower extremity with ulcer and inflammation ([place]) 06/04/2016  . Left below-knee amputee ([place]) 02/23/2016  . Type 2 diabetes mellitus with diabetic polyneuropathy, with long-term current  use of insulin ([place]) 03/29/2015  . Advance directive discussed with patient 02/07/2014  . Normocytic anemia 04/01/2013  . Diabetic Charcot foot ([place]) 04/01/2013  . Routine general medical examination at a health care facility 12/25/2010  . HEMORRHOIDS-INTERNAL 11/22/2009  . PERSONAL HX COLONIC POLYPS 11/22/2009  . Venous (peripheral) insufficiency 06/29/2008  . Osteoarthritis, multiple sites 04/03/2007  . ERECTILE DYSFUNCTION, ORGANIC 06/13/2006  . Hyperlipemia 06/11/2006  . GLAUCOMA 06/11/2006  . Coronary atherosclerosis of native coronary artery 06/11/2006  . Obstructive sleep apnea 06/11/2006   Past Medical History:  Diagnosis Date  . Coronary atherosclerosis of unspecified type of vessel, native or graft   . Diabetes mellitus without complication ([place])   . Hemorrhage of rectum and anus   . History of kidney stones   . Impotence of organic origin   . Internal hemorrhoids without mention of complication   . Malignant neoplasm of prostate ([place])   . Mononeuritis of unspecified site   . Osteoarthrosis, unspecified whether generalized or localized, unspecified site   . Other and unspecified hyperlipidemia   . Personal history of colonic polyps   . Personal history of diabetic foot ulcer    saw wound center, resolved 05/08/2010  . Personal history of gallstones   . Routine general medical examination at a health care facility   . Type II or unspecified type diabetes mellitus with neurological manifestations, not stated as uncontrolled(250.60)   . Unspecified glaucoma(365.9)   . Unspecified sleep apnea    cpcp  . Unspecified venous (peripheral) insufficiency     Family History  Problem Relation Age of Onset  . Lung cancer Father   . Multiple sclerosis Mother   . Heart attack Unknown        paternal aunts and uncles  . Peripheral vascular disease Maternal Grandfather        several amputations    Past Surgical History:  Procedure Laterality Date  . AMPUTATION Left 12/30/2012    Procedure: AMPUTATION RAY ;  Surgeon: Meredith Pel, MD;  [facility];  Service: Orthopedics;  Laterality: Left;  LEFT GREAT TOE RAY AMPUTATION  . AMPUTATION Left 02/23/2016   Procedure: Left Below Knee Amputation;  Surgeon: [name], MD;  Location: [facility];  Service: Orthopedics;  Laterality: Left;  . APPENDECTOMY    . BASAL CELL CARCINOMA EXCISION  2/16   left forearm  . CARDIAC CATHETERIZATION  1998   Negative  . CATARACT EXTRACTION Right 2017   then lid surgery  . CORONARY ARTERY BYPASS GRAFT  09/2005   Post op AFIB  . EYE SURGERY    . FOOT BONE EXCISION Left 11/03/2013   DR DUDA   . I&D EXTREMITY Left 11/03/2013   Procedure: Left Foot Partial Bone Excision Cuboid and Medial Cuneiform, Wound Closures;  Surgeon: Meridee Score  V, MD;  Location: [facility];  Service: Orthopedics;  Laterality: Left;  . INSERTION PROSTATE RADIATION SEED  2009   RT and seeds for prostate cancer  . KIDNEY STONE SURGERY  04/1993  . RCA stents  04/2003   EF 55%  . RETINAL DETACHMENT SURGERY  2002-2003  . thrombosed vein  1993   Right leg   Social History   Occupational History  . Occupation: Heavy equipment mechanic--retired  Tobacco Use  . Smoking status: Former Smoker    Packs/day: 2.00    Years: 35.00    Pack years: 70.00    Types: Cigars, Cigarettes    Last attempt to quit: 01/07/2001    Years since quitting: 16.9  . Smokeless tobacco: Never Used  Substance and Sexual Activity  . Alcohol use: Yes    Comment: rare  . Drug use: No  . Sexual activity: Yes    Partners: Female

## 2017-12-12 ENCOUNTER — Encounter: Payer: Self-pay | Admitting: Internal Medicine

## 2017-12-12 ENCOUNTER — Ambulatory Visit (INDEPENDENT_AMBULATORY_CARE_PROVIDER_SITE_OTHER): Payer: Medicare Other | Admitting: Internal Medicine

## 2017-12-12 VITALS — BP 130/70 | HR 82 | Ht 74.0 in

## 2017-12-12 DIAGNOSIS — E1142 Type 2 diabetes mellitus with diabetic polyneuropathy: Secondary | ICD-10-CM | POA: Diagnosis not present

## 2017-12-12 DIAGNOSIS — E782 Mixed hyperlipidemia: Secondary | ICD-10-CM

## 2017-12-12 DIAGNOSIS — Z794 Long term (current) use of insulin: Secondary | ICD-10-CM

## 2017-12-12 DIAGNOSIS — I2510 Atherosclerotic heart disease of native coronary artery without angina pectoris: Secondary | ICD-10-CM

## 2017-12-12 DIAGNOSIS — Z23 Encounter for immunization: Secondary | ICD-10-CM

## 2017-12-12 LAB — POCT GLYCOSYLATED HEMOGLOBIN (HGB A1C): HEMOGLOBIN A1C: 6.4 % — AB (ref 4.0–5.6)

## 2017-12-12 NOTE — Patient Instructions (Addendum)
Please continue: - Metformin 1000 mg at bedtime - Toujeo 60 units at bedtime - Humalog 25-30 (do not use 36 units) units before meals  Please return in 4 months with your sugar log.

## 2017-12-12 NOTE — Progress Notes (Signed)
Patient ID: [name], male   DOB: 14-Feb-1950, 67 y.o.   MRN: [id number]  HPI: [name] is a 67 y.o.-year-old male, returning for f/u for DM2, dx 2008, insulin-dependent since dx, uncontrolled, with complications (peripheral neuropathy, CAD-status post CABG in 2008, PVD, Charcot foot, history of diabetic foot ulcer, history of retinal detachment in 2002-3). Last visit 4 mo ago.  He has Production designer, theatre/television/film and supplemental insurance - [place]. Also Part D - [employer].   He had skin flap necrosis develop since last visit.  He had 3 eye surgeries and had to have very treatments.  Sugars were higher during the treatment so the HbA1c had increased at last visit.  Now all healed.  Sugars have improved.  He has a L lower leg ulcer >> now healing. On tid protein shakes, Zn nd vit C 2x a day. He has 3 ulcers on his L stump, also >> improving. Seeing Dr. [name].  Latest HbA1c was slightly higher: Lab Results  Component Value Date   HGBA1C 7.3 (A) 08/05/2017   HGBA1C 6.3 03/06/2017   HGBA1C 5.7 11/05/2016   Pt is on a regimen of: - Metformin 1000 mg with dinner - Toujeo 60 units at bedtime - Humalog 20-24  >> 25-30 (36) units before meals  Pt checks his sugars 1-3 times a day -improved: - am: 106-147, 160 (candy), 180 (grapes) >> 150-190 >> 87, 130-150 - before lunch: 69, 104-154, 207 >> n/c >> 56 >> n/c - before dinner: 126, 141-184, 211 >> 62-132 >> 109-161 >> n/c >> 130-160 - bedtime:   68-109, 136, 254, 293 >> 96-160 >> n/c >> 110-130 Lowest: 56 x1 (lunchtime) >> 96 >> 110 >> 80s. Hypogly awareness at 100.  Highest sugar: 200 >> 180 (Tx-giving).  Patient meals are: - Breakfast: bacon, eggs, tomato sandwich (sometimes no bread) >> coffee + fruit - Lunch: PB crackers >> baked fish or chicken + veggies - Dinner: meat + 2 veggies  >> same as lunch - Snacks: 1 a day - 2x a week, icecream  >> fruit Uses Splenda in tea and coffee.  -+ CKD, last BUN/creatinine:  Lab Results  Component Value Date    BUN 18 02/19/2017   CREATININE 1.11 02/19/2017  On losartan. -+ HL; last set of lipids: Lab Results  Component Value Date   CHOL 107 02/19/2017   HDL 26.40 (L) 02/19/2017   LDLCALC 52 02/19/2017   LDLDIRECT 72.0 02/13/2015   TRIG 143.0 02/19/2017   CHOLHDL 4 02/19/2017  He is statin intolerant. - last eye exam was 01/2017: No DR.  Dr. Claudean Kinds.  He had a detached retina in 03/2015 >> had Laser sx (his 3rd).  He had right cataract surgery 06/2015. -He has no sensation in his feet.  He had amputation of his big toe in 12/30/2012 2/2 infection >> osteomyelitis. He continued to have problems with ulcers on his left foot, in the setting of Charcot joint. He had a L BKA (02/23/2016) b/c he had a long h/o ulcer on his Charcot foot >> developed osteomyelitis.  Now prosthesis is off due to his ulcers.   He also has a history of prostate cancer , s/p 25 RxTx, then radioactive seeds. Also, HTN, OSA, OA.  ROS: Constitutional: no weight gain/no weight loss, no fatigue, no subjective hyperthermia, no subjective hypothermia Eyes: no blurry vision, no xerophthalmia ENT: no sore throat, no nodules palpated in neck, no dysphagia, no odynophagia, no hoarseness Cardiovascular: no CP/no SOB/no palpitations/no leg swelling  Respiratory: no cough/no SOB/no wheezing Gastrointestinal: no N/no V/no D/no C/no acid reflux Musculoskeletal: no muscle aches/no joint aches Skin: no rashes, no hair loss, + skin ulcers bilateral legs Neurological: + tremors/no numbness/no tingling/no dizziness  I reviewed pt's medications, allergies, PMH, social hx, family hx, and changes were documented in the history of present illness. Otherwise, unchanged from my initial visit note.   Past Medical History:  Diagnosis Date  . Coronary atherosclerosis of unspecified type of vessel, native or graft   . Diabetes mellitus without complication ([place])   . Hemorrhage of rectum and anus   . History of kidney stones   . Impotence  of organic origin   . Internal hemorrhoids without mention of complication   . Malignant neoplasm of prostate ([place])   . Mononeuritis of unspecified site   . Osteoarthrosis, unspecified whether generalized or localized, unspecified site   . Other and unspecified hyperlipidemia   . Personal history of colonic polyps   . Personal history of diabetic foot ulcer    saw wound center, resolved 05/08/2010  . Personal history of gallstones   . Routine general medical examination at a health care facility   . Type II or unspecified type diabetes mellitus with neurological manifestations, not stated as uncontrolled(250.60)   . Unspecified glaucoma(365.9)   . Unspecified sleep apnea    cpcp  . Unspecified venous (peripheral) insufficiency    Past Surgical History:  Procedure Laterality Date  . AMPUTATION Left 12/30/2012   Procedure: AMPUTATION RAY ;  Surgeon: Meredith Pel, MD;  Location: [facility];  Service: Orthopedics;  Laterality: Left;  LEFT GREAT TOE RAY AMPUTATION  . AMPUTATION Left 02/23/2016   Procedure: Left Below Knee Amputation;  Surgeon: [name], MD;  Location: [facility];  Service: Orthopedics;  Laterality: Left;  . APPENDECTOMY    . BASAL CELL CARCINOMA EXCISION  2/16   left forearm  . CARDIAC CATHETERIZATION  1998   Negative  . CATARACT EXTRACTION Right 2017   then lid surgery  . CORONARY ARTERY BYPASS GRAFT  09/2005   Post op AFIB  . EYE SURGERY    . FOOT BONE EXCISION Left 11/03/2013   DR DUDA   . I&D EXTREMITY Left 11/03/2013   Procedure: Left Foot Partial Bone Excision Cuboid and Medial Cuneiform, Wound Closures;  Surgeon: [name], MD;  Location: [facility];  Service: Orthopedics;  Laterality: Left;  . INSERTION PROSTATE RADIATION SEED  2009   RT and seeds for prostate cancer  . KIDNEY STONE SURGERY  04/1993  . RCA stents  04/2003   EF 55%  . RETINAL DETACHMENT SURGERY  2002-2003  . thrombosed vein  1993   Right leg   Social History   Socioeconomic History  .  Marital status: Widowed    Spouse name: Not on file  . Number of children: 3  . Years of education: Not on file  . Highest education level: Not on file  Occupational History  . Occupation: Heavy equipment mechanic--retired  Social Needs  . Financial resource strain: Not on file  . Food insecurity:    Worry: Not on file    Inability: Not on file  . Transportation needs:    Medical: Not on file    Non-medical: Not on file  Tobacco Use  . Smoking status: Former Smoker    Packs/day: 2.00    Years: 35.00    Pack years: 70.00    Types: Cigars, Cigarettes    Last attempt to  quit: 01/07/2001    Years since quitting: 16.9  . Smokeless tobacco: Never Used  Substance and Sexual Activity  . Alcohol use: Yes    Comment: rare  . Drug use: No  . Sexual activity: Yes    Partners: Female  Lifestyle  . Physical activity:    Days per week: Not on file    Minutes per session: Not on file  . Stress: Not on file  Relationships  . Social connections:    Talks on phone: Not on file    Gets together: Not on file    Attends religious service: Not on file    Active member of club or organization: Not on file    Attends meetings of clubs or organizations: Not on file    Relationship status: Not on file  . Intimate partner violence:    Fear of current or ex partner: Not on file    Emotionally abused: Not on file    Physically abused: Not on file    Forced sexual activity: Not on file  Other Topics Concern  . Not on file  Social History Narrative   Has living will   Daughter [name] is [facility] care POA    Would accept resuscitation attempts--but no prolonged artificial ventilation   No tube feeds if cognitively unaware   Current Outpatient Medications on File Prior to Visit  Medication Sig Dispense Refill  . ACCU-CHEK FASTCLIX LANCETS MISC Use to check sugar 3 times daily dx- E11.42 300 each 5  . aspirin 325 MG tablet Take 325 mg by mouth daily.      . Blood Glucose Monitoring  Suppl (ACCU-CHEK GUIDE) w/Device KIT 1 kit by Other route 3 (three) times daily. Use to check blood sugars 3 times daily, Dx Code E11.42 1 kit 1  . carvedilol (COREG) 25 MG tablet TAKE 1 TABLET TWICE DAILY 180 tablet 3  . doxycycline (VIBRA-TABS) 100 MG tablet Take 1 tablet (100 mg total) by mouth 2 (two) times daily. 30 tablet 0  . DROPLET PEN NEEDLES 31G X 5 MM MISC USE TO INJECT INSULIN FOUR TIMES DAILY AS INSTRUCTED (FOR DIABETES) 400 each 0  . glucose blood (ACCU-CHEK GUIDE) test strip Use as instructed to check sugar 3 times daily dx- E11.42 300 each 5  . insulin aspart (NOVOLOG FLEXPEN) 100 UNIT/ML FlexPen INJECT  30  TO  35 UNITS SUBCUTANEOUSLY THREE TIMES DAILY WITH MEALS 90 mL 1  . Insulin Glargine (TOUJEO SOLOSTAR) 300 UNIT/ML SOPN Inject 60 Units into the skin daily. 18 pen 3  . losartan-hydrochlorothiazide (HYZAAR) 50-12.5 MG tablet TAKE 1 TABLET EVERY DAY 90 tablet 3  . metFORMIN (GLUCOPHAGE) 1000 MG tablet Take 1 tablet (1,000 mg total) by mouth daily. 90 tablet 3  . mupirocin ointment (BACTROBAN) 2 % Apply to wound daily (Patient taking differently: Apply 1 application topically daily as needed (wound healing). Apply to wound daily) 22 g 0  . naproxen sodium (ANAPROX) 220 MG tablet Take 440 mg by mouth 2 (two) times daily as needed (for pain.). For pain    . nitroGLYCERIN (NITRODUR - DOSED IN MG/24 HR) 0.2 mg/hr patch PLACE 1 PATCH (0.2 MG TOTAL) DAILY ONTO THE SKIN. 90 patch 4  . pentoxifylline (TRENTAL) 400 MG CR tablet Take 1 tablet (400 mg total) by mouth 3 (three) times daily with meals. 90 tablet 3  . potassium citrate (UROCIT-K) 10 MEQ (1080 MG) SR tablet Take 20 mEq by mouth 2 (two) times daily.  12  . Probiotic Product (PROBIOTIC DAILY PO) Take 1 capsule by mouth daily.    . silver sulfADIAZINE (SILVADENE) 1 % cream Apply 1 application topically daily. 50 g 0  . silver sulfADIAZINE (SILVADENE) 1 % cream Apply 1 application topically daily. Apply to affected area daily plus  dry dressing 400 g 3  . sulfamethoxazole-trimethoprim (BACTRIM DS,SEPTRA DS) 800-160 MG tablet Take 1 tablet by mouth 2 (two) times daily. 40 tablet 0  . triamcinolone (KENALOG) 0.025 % cream APPLY 1 APPLICATION TOPICALLY 3 (THREE) TIMES DAILY AS NEEDED. (Patient taking differently: APPLY 1 APPLICATION TOPICALLY 3 (THREE) TIMES DAILY AS NEEDED FOR DRY SKIN AND RASH) 30 g 3   No current facility-administered medications on file prior to visit.    Allergies  Allergen Reactions  . Atorvastatin Other (See Comments)    REACTION: myalgias  . Ezetimibe Other (See Comments)    Body ache  . Simvastatin Other (See Comments)    Body ache   Family History  Problem Relation Age of Onset  . Lung cancer Father   . Multiple sclerosis Mother   . Heart attack Unknown        paternal aunts and uncles  . Peripheral vascular disease Maternal Grandfather        several amputations    PE: BP 130/70   Pulse 82   Ht '6\' 2"'$  (1.88 m)   SpO2 98%   BMI 37.23 kg/m  Body mass index is 37.23 kg/m.   Wt Readings from Last 3 Encounters:  12/11/17 290 lb (131.5 kg)  12/02/17 290 lb (131.5 kg)  11/27/17 290 lb (131.5 kg)   Constitutional: overweight, in NAD Eyes: PERRLA, EOMI, no exophthalmos ENT: moist mucous membranes, no thyromegaly, no cervical lymphadenopathy Cardiovascular: RRR, No MRG, + right BKA with right lower extremity edema Respiratory: CTA B Gastrointestinal: abdomen soft, NT, ND, BS+ Musculoskeletal: no deformities, strength intact in all 4 Skin: moist, warm, no rashes Neurological: + L tremor with outstretched hands, DTR normal in all 4  ASSESSMENT: 1. DM2, insulin-dependent, uncontrolled, with complications - peripheral neuropathy - CAD-status post stent in 2005, then 5v CABG in 2008 - PVD - history of diabetic foot ulcer - Charcot foot - L - h/o amputation of his big toe in 12/2012 2/2 osteomyelitis and L BKA 01/2016 - history of retinal detachment in 2002-3  2. Obesity class  2 BMI Classification:  < 18.5 underweight   18.5-24.9 normal weight   25.0-29.9 overweight   30.0-34.9 class I obesity   35.0-39.9 class II obesity   ? 40.0 class III obesity   3. HL  PLAN:  1. Patient with previously uncontrolled type 2 diabetes, on basal-bolus insulin regimen and metformin, with better control after he started to eat a more plant-based diet.  He initially lost 20 pounds, but gained some of them back.  At last visit, he restarted a plant-based diet and was determined to continue.  However, he had a setback due to developing skin flap necrosis and having to have hyperbaric treatments.  At last visit, HbA1c was higher, at 7.3%.  At that time, since he was just restarting on the plant-based diet, I advised him that he could increase the Toujeo by 10 more units if sugars did not improve after starting back on the diet.  We did not make any other changes. -At this visit, sugars have improved, but they are above target before meals.  I believe that this is due to his protein shakes.  As  he may have lower blood sugars in the morning after he takes his long-acting insulin at night, I did not advise him to increase the Toujeo dose.  However, since he is occasionally taking a high dose of Humalog before meals and he may end up dropping sugars after meals, I advised him to stay with a lower doses, 25 to 30 units before meals.  He is also planning to start eating healthier meals and we discussed about reducing the dose of NovoLog when he starts. - I advised him to:  Patient Instructions  Please continue: - Metformin 1000 mg at bedtime - Toujeo 60 units at bedtime - Humalog 25-30 units before meals  Please return in 4 months with your sugar log.   - today, HbA1c is 6.4% (lower) - continue checking sugars at different times of the day - check 3-4x a day, rotating checks - advised for yearly eye exams >> he is UTD - Return to clinic in 4 mo with sugar log   2. Obesity class  2 -She initially lost 20 pounds on a more plant-based diet.  At last visit, he had gained 9 pounds but he stabilized afterwards.  3. HL - Reviewed latest lipid panel from 02/2017: LDL now at goal, HDL low Lab Results  Component Value Date   CHOL 107 02/19/2017   HDL 26.40 (L) 02/19/2017   LDLCALC 52 02/19/2017   LDLDIRECT 72.0 02/13/2015   TRIG 143.0 02/19/2017   CHOLHDL 4 02/19/2017  -He is intolerant to statins.  Philemon Kingdom, MD PhD [facility] Endocrinology

## 2017-12-13 ENCOUNTER — Emergency Department (HOSPITAL_COMMUNITY): Payer: Medicare Other

## 2017-12-13 ENCOUNTER — Other Ambulatory Visit: Payer: Self-pay

## 2017-12-13 ENCOUNTER — Emergency Department (HOSPITAL_COMMUNITY)
Admission: EM | Admit: 2017-12-13 | Discharge: 2017-12-14 | Disposition: A | Discharge status: Home / Self Care | Payer: Medicare Other | Attending: Emergency Medicine | Admitting: Emergency Medicine

## 2017-12-13 ENCOUNTER — Encounter (HOSPITAL_COMMUNITY): Payer: Self-pay | Admitting: Pharmacy Technician

## 2017-12-13 DIAGNOSIS — Z87442 Personal history of urinary calculi: Secondary | ICD-10-CM | POA: Insufficient documentation

## 2017-12-13 DIAGNOSIS — R1031 Right lower quadrant pain: Secondary | ICD-10-CM | POA: Insufficient documentation

## 2017-12-13 DIAGNOSIS — R112 Nausea with vomiting, unspecified: Secondary | ICD-10-CM | POA: Diagnosis not present

## 2017-12-13 DIAGNOSIS — E119 Type 2 diabetes mellitus without complications: Secondary | ICD-10-CM | POA: Insufficient documentation

## 2017-12-13 DIAGNOSIS — N200 Calculus of kidney: Secondary | ICD-10-CM

## 2017-12-13 DIAGNOSIS — Z79899 Other long term (current) drug therapy: Secondary | ICD-10-CM | POA: Diagnosis not present

## 2017-12-13 DIAGNOSIS — Z87891 Personal history of nicotine dependence: Secondary | ICD-10-CM | POA: Diagnosis not present

## 2017-12-13 DIAGNOSIS — Z8546 Personal history of malignant neoplasm of prostate: Secondary | ICD-10-CM | POA: Insufficient documentation

## 2017-12-13 DIAGNOSIS — I2510 Atherosclerotic heart disease of native coronary artery without angina pectoris: Secondary | ICD-10-CM | POA: Diagnosis not present

## 2017-12-13 DIAGNOSIS — N289 Disorder of kidney and ureter, unspecified: Secondary | ICD-10-CM | POA: Insufficient documentation

## 2017-12-13 DIAGNOSIS — K8020 Calculus of gallbladder without cholecystitis without obstruction: Secondary | ICD-10-CM | POA: Diagnosis not present

## 2017-12-13 DIAGNOSIS — Z794 Long term (current) use of insulin: Secondary | ICD-10-CM | POA: Insufficient documentation

## 2017-12-13 DIAGNOSIS — N10 Acute tubulo-interstitial nephritis: Secondary | ICD-10-CM | POA: Insufficient documentation

## 2017-12-13 MED ORDER — MORPHINE SULFATE (PF) 4 MG/ML IV SOLN
4.0000 mg | Freq: Once | INTRAVENOUS | Status: AC
  Administered 2017-12-14: 4 mg via INTRAVENOUS
  Filled 2017-12-13: qty 1

## 2017-12-13 MED ORDER — ONDANSETRON HCL 4 MG/2ML IJ SOLN
4.0000 mg | Freq: Once | INTRAMUSCULAR | Status: AC
  Administered 2017-12-14: 4 mg via INTRAVENOUS
  Filled 2017-12-13: qty 2

## 2017-12-13 NOTE — ED Notes (Signed)
Nurse will start IV and draw labs. 

## 2017-12-13 NOTE — ED Triage Notes (Signed)
In progress

## 2017-12-13 NOTE — ED Provider Notes (Signed)
[facility] EMERGENCY DEPARTMENT Provider Note   CSN: [id number] Arrival date & time: 12/13/17  2311     History   Chief Complaint No chief complaint on file.   HPI [name] is a 67 y.o. male.  The history is provided by the patient.  He has history of diabetes, hyperlipidemia, coronary artery disease and comes in with right suprapubic pain which she feels is likely to be a kidney stone.  Pain started 2 days ago and is associated with nausea and vomiting.  He rates his pain at 8/10.  It is somewhat better if he is able to urinate, but is only able to urinate a small amount at a time.  Nothing makes it worse.  He has had subjective fever as well as chills and sweats.  He has not taken anything for his pain.  Past Medical History:  Diagnosis Date  . Coronary atherosclerosis of unspecified type of vessel, native or graft   . Diabetes mellitus without complication ([place])   . Hemorrhage of rectum and anus   . History of kidney stones   . Impotence of organic origin   . Internal hemorrhoids without mention of complication   . Malignant neoplasm of prostate ([place])   . Mononeuritis of unspecified site   . Osteoarthrosis, unspecified whether generalized or localized, unspecified site   . Other and unspecified hyperlipidemia   . Personal history of colonic polyps   . Personal history of diabetic foot ulcer    saw wound center, resolved 05/08/2010  . Personal history of gallstones   . Routine general medical examination at a health care facility   . Type II or unspecified type diabetes mellitus with neurological manifestations, not stated as uncontrolled(250.60)   . Unspecified glaucoma(365.9)   . Unspecified sleep apnea    cpcp  . Unspecified venous (peripheral) insufficiency     Patient Active Problem List   Diagnosis Date Noted  . PAD (peripheral artery disease) ([place]) 02/19/2017  . Chronic renal disease, stage III ([place]) 02/19/2017  . Morbid obesity ([place])  11/05/2016  . Ulcer of toe of right foot, limited to breakdown of skin ([place]) 10/11/2016  . Non-pressure chronic ulcer of left calf, limited to breakdown of skin ([place]) 06/04/2016  . Idiopathic chronic venous hypertension of right lower extremity with ulcer and inflammation ([place]) 06/04/2016  . Left below-knee amputee ([place]) 02/23/2016  . Type 2 diabetes mellitus with diabetic polyneuropathy, with long-term current use of insulin ([place]) 03/29/2015  . Advance directive discussed with patient 02/07/2014  . Normocytic anemia 04/01/2013  . Diabetic Charcot foot ([place]) 04/01/2013  . Routine general medical examination at a health care facility 12/25/2010  . HEMORRHOIDS-INTERNAL 11/22/2009  . PERSONAL HX COLONIC POLYPS 11/22/2009  . Venous (peripheral) insufficiency 06/29/2008  . Osteoarthritis, multiple sites 04/03/2007  . ERECTILE DYSFUNCTION, ORGANIC 06/13/2006  . Hyperlipemia 06/11/2006  . GLAUCOMA 06/11/2006  . Coronary atherosclerosis of native coronary artery 06/11/2006  . Obstructive sleep apnea 06/11/2006    Past Surgical History:  Procedure Laterality Date  . AMPUTATION Left 12/30/2012   Procedure: AMPUTATION RAY ;  Surgeon: Meredith Pel, MD;  Location: [facility];  Service: Orthopedics;  Laterality: Left;  LEFT GREAT TOE RAY AMPUTATION  . AMPUTATION Left 02/23/2016   Procedure: Left Below Knee Amputation;  Surgeon: [name], MD;  Location: [facility];  Service: Orthopedics;  Laterality: Left;  . APPENDECTOMY    . BASAL CELL CARCINOMA EXCISION  2/16   left forearm  .  CARDIAC CATHETERIZATION  1998   Negative  . CATARACT EXTRACTION Right 2017   then lid surgery  . CORONARY ARTERY BYPASS GRAFT  09/2005   Post op AFIB  . EYE SURGERY    . FOOT BONE EXCISION Left 11/03/2013   DR DUDA   . I&D EXTREMITY Left 11/03/2013   Procedure: Left Foot Partial Bone Excision Cuboid and Medial Cuneiform, Wound Closures;  Surgeon: [name], MD;  Location: [facility];  Service: Orthopedics;   Laterality: Left;  . INSERTION PROSTATE RADIATION SEED  2009   RT and seeds for prostate cancer  . KIDNEY STONE SURGERY  04/1993  . RCA stents  04/2003   EF 55%  . RETINAL DETACHMENT SURGERY  2002-2003  . thrombosed vein  1993   Right leg        Home Medications    Prior to Admission medications   Medication Sig Start Date End Date Taking? Authorizing Provider  ACCU-CHEK FASTCLIX LANCETS MISC Use to check sugar 3 times daily dx- E11.42 07/03/16   Philemon Kingdom, MD  aspirin 325 MG tablet Take 325 mg by mouth daily.      [provider]  Blood Glucose Monitoring Suppl (ACCU-CHEK GUIDE) w/Device KIT 1 kit by Other route 3 (three) times daily. Use to check blood sugars 3 times daily, Dx Code F02.77 07/09/16   Philemon Kingdom, MD  carvedilol (COREG) 25 MG tablet TAKE 1 TABLET TWICE DAILY 05/28/17   [name], MD  doxycycline (VIBRA-TABS) 100 MG tablet Take 1 tablet (100 mg total) by mouth 2 (two) times daily. 11/24/17   Rayburn, Neta Mends, PA-C  DROPLET PEN NEEDLES 31G X 5 MM MISC USE TO INJECT INSULIN FOUR TIMES DAILY AS INSTRUCTED (FOR DIABETES) 11/18/17   Philemon Kingdom, MD  glucose blood (ACCU-CHEK GUIDE) test strip Use as instructed to check sugar 3 times daily dx- E11.42 07/03/16   Philemon Kingdom, MD  insulin aspart (NOVOLOG FLEXPEN) 100 UNIT/ML FlexPen INJECT&nbsp;&nbsp;30&nbsp;&nbsp;TO&nbsp;&nbsp;35 UNITS SUBCUTANEOUSLY THREE TIMES DAILY WITH MEALS 11/11/17   Philemon Kingdom, MD  Insulin Glargine (TOUJEO SOLOSTAR) 300 UNIT/ML SOPN Inject 60 Units into the skin daily. 03/06/17   Philemon Kingdom, MD  losartan-hydrochlorothiazide [facility]) 50-12.5 MG tablet TAKE 1 TABLET EVERY DAY 07/04/17   Viviana Simpler I, MD  metFORMIN (GLUCOPHAGE) 1000 MG tablet Take 1 tablet (1,000 mg total) by mouth daily. 03/06/17   Philemon Kingdom, MD  mupirocin ointment (BACTROBAN) 2 % Apply to wound daily Patient taking differently: Apply 1 application topically daily as needed  (wound healing). Apply to wound daily 05/31/16   Suzan Slick, NP  naproxen sodium (ANAPROX) 220 MG tablet Take 440 mg by mouth 2 (two) times daily as needed (for pain.). For pain    [provider]  nitroGLYCERIN (NITRODUR - DOSED IN MG/24 HR) 0.2 mg/hr patch PLACE 1 PATCH (0.2 MG TOTAL) DAILY ONTO THE SKIN. 12/08/17   [name], MD  pentoxifylline (TRENTAL) 400 MG CR tablet Take 1 tablet (400 mg total) by mouth 3 (three) times daily with meals. 05/05/17   [name], MD  potassium citrate (UROCIT-K) 10 MEQ (1080 MG) SR tablet Take 20 mEq by mouth 2 (two) times daily.  03/30/14   [provider]  Probiotic Product (PROBIOTIC DAILY PO) Take 1 capsule by mouth daily.    [provider]  silver sulfADIAZINE (SILVADENE) 1 % cream Apply 1 application topically daily. 02/24/17   Suzan Slick, NP  silver sulfADIAZINE (SILVADENE) 1 % cream  Apply 1 application topically daily. Apply to affected area daily plus dry dressing 09/01/17   [name], MD  sulfamethoxazole-trimethoprim (BACTRIM DS,SEPTRA DS) 800-160 MG tablet Take 1 tablet by mouth 2 (two) times daily. 08/21/17   [name], MD  triamcinolone (KENALOG) 0.025 % cream APPLY 1 APPLICATION TOPICALLY 3 (THREE) TIMES DAILY AS NEEDED. Patient taking differently: APPLY 1 APPLICATION TOPICALLY 3 (THREE) TIMES DAILY AS NEEDED FOR DRY SKIN AND RASH 02/22/14   [name], MD    Family History Family History  Problem Relation Age of Onset  . Lung cancer Father   . Multiple sclerosis Mother   . Heart attack Unknown        paternal aunts and uncles  . Peripheral vascular disease Maternal Grandfather        several amputations    Social History Social History   Tobacco Use  . Smoking status: Former Smoker    Packs/day: 2.00    Years: 35.00    Pack years: 70.00    Types: Cigars, Cigarettes    Last attempt to quit: 01/07/2001    Years since quitting: 16.9  . Smokeless tobacco: Never Used  Substance Use  Topics  . Alcohol use: Yes    Comment: rare  . Drug use: No     Allergies   Atorvastatin; Ezetimibe; and Simvastatin   Review of Systems Review of Systems  All other systems reviewed and are negative.    Physical Exam Updated Vital Signs BP 138/77   Pulse (!) 106   Temp 99 F (37.2 C) (Oral)   Resp 18   SpO2 97%   Physical Exam  Nursing note and vitals reviewed.  67 year old male, resting comfortably and in no acute distress. Vital signs are significant for slight increased heart rate. Oxygen saturation is 97%, which is normal. Head is normocephalic and atraumatic. PERRLA, EOMI. Oropharynx is clear. Neck is nontender and supple without adenopathy or JVD. Back is nontender in the midline.  There is mild right CVA tenderness. Lungs are clear without rales, wheezes, or rhonchi. Chest is nontender. Heart has regular rate and rhythm without murmur. Abdomen is soft, flat, with mild right suprapubic tenderness laterally.  There are no masses or hepatosplenomegaly and peristalsis is hypoactive. Extremities: Left below the knee amputation, otherwise normal. Skin is warm and dry without rash. Neurologic: Mental status is normal, cranial nerves are intact, there are no motor or sensory deficits.  ED Treatments / Results  Labs (all labs ordered are listed, but only abnormal results are displayed) Labs Reviewed  COMPREHENSIVE METABOLIC PANEL - Abnormal; Notable for the following components:      Result Value   Sodium 133 (*)    Glucose, Bld 171 (*)    BUN 31 (*)    Creatinine, Ser 1.33 (*)    AST 14 (*)    Total Bilirubin 1.3 (*)    GFR calc non Af Amer 54 (*)    All other components within normal limits  CBC WITH DIFFERENTIAL/PLATELET - Abnormal; Notable for the following components:   WBC 15.1 (*)    RDW 17.0 (*)    Neutro Abs 12.9 (*)    Monocytes Absolute 1.1 (*)    Abs Immature Granulocytes 0.20 (*)    All other components within normal limits  URINALYSIS,  ROUTINE W REFLEX MICROSCOPIC - Abnormal; Notable for the following components:   Ketones, ur 5 (*)    All other components within normal limits  LIPASE,  BLOOD    Radiology Ct Renal Stone Study  Result Date: 12/13/2017 CLINICAL DATA:  Right groin/flank pain.  Decreased urination. EXAM: CT ABDOMEN AND PELVIS WITHOUT CONTRAST TECHNIQUE: Multidetector CT imaging of the abdomen and pelvis was performed following the standard protocol without IV contrast. COMPARISON:  11/08/2016.  09/13/2014. FINDINGS: Lower chest: 2 mm pulmonary nodule posterior right lower lobe (15/4). 2 mm nodule seen in the left lower lobe on 10/04. both of these nodules were present on a scan from 09/13/2014 consistent with benign process such as scarring. Hepatobiliary: The liver shows diffusely decreased attenuation suggesting steatosis. No focal abnormality in the liver on this study without intravenous contrast. Tiny calcified gallstones evident. No intrahepatic or extrahepatic biliary dilation. Pancreas: No focal mass lesion. No dilatation of the main duct. No intraparenchymal cyst. No peripancreatic edema. Spleen: No splenomegaly. No focal mass lesion. Adrenals/Urinary Tract: 13 mm right adrenal nodule stable since 09/13/2014. 2 cm nodule left adrenal gland also stable since 2016 and with low attenuation suggesting adenoma or myelolipoma. 1 mm nonobstructing stone seen in the lower pole the right kidney (coronal image 78/6). No stones identified in the right ureter. No secondary changes in the right kidney or ureter. 4-5 mm nonobstructing stone is seen in the lower pole the left kidney (coronal 73/6). 5-6 mm interpolar left renal stone seen on 81/6. No left ureteral stone. No secondary changes in the left kidney or ureter. No bladder stone. Stomach/Bowel: Stomach is nondistended. No gastric wall thickening. No evidence of outlet obstruction. Duodenum is normally positioned as is the ligament of Treitz. Large duodenal diverticulum  evident. No small bowel wall thickening. No small bowel dilatation. The terminal ileum is normal. The appendix is not visualized, but there is no edema or inflammation in the region of the cecum. No gross colonic mass. No colonic wall thickening. Diverticuli are seen scattered along the entire length of the colon without CT findings of diverticulitis. Vascular/Lymphatic: There is abdominal aortic atherosclerosis without aneurysm. There is no gastrohepatic or hepatoduodenal ligament lymphadenopathy. No intraperitoneal or retroperitoneal lymphadenopathy. No pelvic sidewall lymphadenopathy. Upper normal lymph nodes are seen in the right common femoral region, presumably reactive. These are only minimally progressed compared to 11/08/2016. Reproductive: Brachytherapy seeds noted in the prostate gland. Other: No intraperitoneal free fluid. Musculoskeletal: Bilateral groin hernias contain only fat. Degenerative changes are noted in the hips, right greater than left. No worrisome lytic or sclerotic osseous abnormality. IMPRESSION: 1. Bilateral nonobstructing nephrolithiasis. No ureteral or bladder stones. No secondary changes are seen in either kidney or ureter. 2. Cholelithiasis. 3. Bilateral small groin hernias contain only fat. 4.  Aortic Atherosclerois (ICD10-170.0) Electronically Signed   By: Misty Stanley M.D.   On: 12/13/2017 23:55    Procedures Procedures   Medications Ordered in ED Medications  doxycycline (VIBRA-TABS) tablet 100 mg (has no administration in time range)  morphine 4 MG/ML injection 4 mg (4 mg Intravenous Given 12/14/17 0002)  ondansetron (ZOFRAN) injection 4 mg (4 mg Intravenous Given 12/14/17 0002)     Initial Impression / Assessment and Plan / ED Course  I have reviewed the triage vital signs and the nursing notes.  Pertinent labs & imaging results that were available during my care of the patient were reviewed by me and considered in my medical decision making (see chart for  details).  Right flank and right lower quadrant pain consistent with urolithiasis.  Consider urinary tract infection, diverticulitis, appendicitis.  Old records are reviewed, and I see no relevant past visits,  no prior abdominal CT scans.  He is sent for CT renal stone protocol which does show bilateral nephrolithiasis but no evidence of ureterolithiasis.  Incidental finding of cholelithiasis.  Urine does not show evidence of infection, WBC is mildly elevated.  Renal insufficiency is present, but actually improved over baseline.  He had excellent relief of pain with above-noted medications.  Will treat for possible occult UTI with course of doxycycline.  Also given prescription for small number of hydrocodone-acetaminophen as well as ondansetron.  Recommended follow-up with PCP in 2 days, return if symptoms worsen.  Final Clinical Impressions(s) / ED Diagnoses   Final diagnoses:  RLQ abdominal pain  Renal insufficiency  Nephrolithiasis    ED Discharge Orders         Ordered    doxycycline (VIBRAMYCIN) 100 MG capsule  2 times daily     12/14/17 0140    ondansetron (ZOFRAN) 4 MG tablet  Every 6 hours PRN     12/14/17 0140    HYDROcodone-acetaminophen (NORCO) 5-325 MG tablet  Every 4 hours PRN     12/14/17 3361           Delora Fuel, MD 22/44/97 [phone]

## 2017-12-13 NOTE — ED Triage Notes (Signed)
Pt POV with reports of R groin/flank pain onset yesterday. Pt reports decreased urination as well. Hx kidney stones, states this feels the same. Denies blood in urine.

## 2017-12-14 DIAGNOSIS — R1031 Right lower quadrant pain: Secondary | ICD-10-CM | POA: Diagnosis not present

## 2017-12-14 LAB — CBC WITH DIFFERENTIAL/PLATELET
Abs Immature Granulocytes: 0.2 10*3/uL — ABNORMAL HIGH (ref 0.00–0.07)
Basophils Absolute: 0.1 10*3/uL (ref 0.0–0.1)
Basophils Relative: 0 %
Eosinophils Absolute: 0.1 10*3/uL (ref 0.0–0.5)
Eosinophils Relative: 1 %
HCT: 40.4 % (ref 39.0–52.0)
HEMOGLOBIN: 13 g/dL (ref 13.0–17.0)
Immature Granulocytes: 1 %
LYMPHS PCT: 5 %
Lymphs Abs: 0.7 10*3/uL (ref 0.7–4.0)
MCH: 27.3 pg (ref 26.0–34.0)
MCHC: 32.2 g/dL (ref 30.0–36.0)
MCV: 84.9 fL (ref 80.0–100.0)
Monocytes Absolute: 1.1 10*3/uL — ABNORMAL HIGH (ref 0.1–1.0)
Monocytes Relative: 7 %
Neutro Abs: 12.9 10*3/uL — ABNORMAL HIGH (ref 1.7–7.7)
Neutrophils Relative %: 86 %
Platelets: 226 10*3/uL (ref 150–400)
RBC: 4.76 MIL/uL (ref 4.22–5.81)
RDW: 17 % — ABNORMAL HIGH (ref 11.5–15.5)
WBC: 15.1 10*3/uL — ABNORMAL HIGH (ref 4.0–10.5)
nRBC: 0 % (ref 0.0–0.2)

## 2017-12-14 LAB — URINALYSIS, ROUTINE W REFLEX MICROSCOPIC
BILIRUBIN URINE: NEGATIVE
Glucose, UA: NEGATIVE mg/dL
HGB URINE DIPSTICK: NEGATIVE
Ketones, ur: 5 mg/dL — AB
Leukocytes, UA: NEGATIVE
Nitrite: NEGATIVE
Protein, ur: NEGATIVE mg/dL
Specific Gravity, Urine: 1.021 (ref 1.005–1.030)
pH: 5 (ref 5.0–8.0)

## 2017-12-14 LAB — COMPREHENSIVE METABOLIC PANEL
ALT: 15 U/L (ref 0–44)
AST: 14 U/L — ABNORMAL LOW (ref 15–41)
Albumin: 3.5 g/dL (ref 3.5–5.0)
Alkaline Phosphatase: 39 U/L (ref 38–126)
Anion gap: 12 (ref 5–15)
BUN: 31 mg/dL — ABNORMAL HIGH (ref 8–23)
CO2: 23 mmol/L (ref 22–32)
Calcium: 9 mg/dL (ref 8.9–10.3)
Chloride: 98 mmol/L (ref 98–111)
Creatinine, Ser: 1.33 mg/dL — ABNORMAL HIGH (ref 0.61–1.24)
GFR calc Af Amer: >60 mL/min (ref >60)
GFR calc non Af Amer: 54 mL/min — ABNORMAL LOW (ref >60)
Glucose, Bld: 171 mg/dL — ABNORMAL HIGH (ref 70–99)
Potassium: 4.4 mmol/L (ref 3.5–5.1)
Sodium: 133 mmol/L — ABNORMAL LOW (ref 135–145)
Total Bilirubin: 1.3 mg/dL — ABNORMAL HIGH (ref 0.3–1.2)
Total Protein: 6.7 g/dL (ref 6.5–8.1)

## 2017-12-14 LAB — LIPASE, BLOOD: Lipase: 26 U/L (ref 11–51)

## 2017-12-14 MED ORDER — DOXYCYCLINE HYCLATE 100 MG PO CAPS: 100.0000 mg | ORAL_CAPSULE | Freq: Two times a day (BID) | ORAL | 0 refills | Status: DC

## 2017-12-14 MED ORDER — DOXYCYCLINE HYCLATE 100 MG PO TABS
100.0000 mg | ORAL_TABLET | Freq: Once | ORAL | Status: AC
  Administered 2017-12-14: 100 mg via ORAL
  Filled 2017-12-14: qty 1

## 2017-12-14 MED ORDER — HYDROCODONE-ACETAMINOPHEN 5-325 MG PO TABS: 1.0000 | ORAL_TABLET | ORAL | 0 refills | Status: DC | PRN

## 2017-12-14 MED ORDER — ONDANSETRON HCL 4 MG PO TABS: 4.0000 mg | ORAL_TABLET | Freq: Four times a day (QID) | ORAL | 0 refills | Status: DC | PRN

## 2017-12-14 MED ORDER — TAMSULOSIN HCL 0.4 MG PO CAPS: 0.4000 mg | ORAL_CAPSULE | Freq: Every day | ORAL | 0 refills | Status: DC

## 2017-12-14 NOTE — Discharge Instructions (Addendum)
Return if pain is getting worse, or if you start running a high fever.

## 2017-12-14 NOTE — ED Notes (Signed)
Patient verbalizes understanding of discharge instructions. Opportunity for questioning and answers were provided. Armband removed by staff, pt discharged from ED.  

## 2017-12-14 NOTE — ED Notes (Signed)
Pearline Cables tube/UC sent to lab with UA.

## 2017-12-16 ENCOUNTER — Telehealth: Payer: Self-pay

## 2017-12-16 NOTE — Telephone Encounter (Signed)
Left a message on VM per DPR asking how pt was feeling after recent ER visit.  If pt calls back, please note. Thanks

## 2017-12-18 NOTE — Telephone Encounter (Signed)
Please call him I don't see any evidence of infection and the antibiotic he got can cause stomach upset. If he is still on the doxy, have him stop it. There are kidney stones, but not blocking. Might need to see urologist if ongoing pain (but not sure that is the problem). If the pain continues off the antibiotic, he will need to be seen

## 2017-12-18 NOTE — Telephone Encounter (Signed)
Pt states they put him on a antibiotic but he is still having abdominal pain. Pt is wondering if he needs to be seen.

## 2017-12-18 NOTE — Telephone Encounter (Signed)
Spoke to pt. He will stop the antibiotic and if he continues to have issues he will let us know.

## 2017-12-20 ENCOUNTER — Emergency Department (HOSPITAL_COMMUNITY): Payer: Medicare Other

## 2017-12-20 ENCOUNTER — Encounter (HOSPITAL_COMMUNITY): Payer: Self-pay | Admitting: Emergency Medicine

## 2017-12-20 ENCOUNTER — Other Ambulatory Visit: Payer: Self-pay

## 2017-12-20 ENCOUNTER — Inpatient Hospital Stay (HOSPITAL_COMMUNITY)
Admission: EM | Admit: 2017-12-20 | Discharge: 2017-12-26 | DRG: 475 | Disposition: A | Discharge status: Rehabilitation Facility | Payer: Medicare Other | Attending: Family Medicine | Admitting: Family Medicine

## 2017-12-20 DIAGNOSIS — Z8719 Personal history of other diseases of the digestive system: Secondary | ICD-10-CM | POA: Diagnosis not present

## 2017-12-20 DIAGNOSIS — N183 Chronic kidney disease, stage 3 unspecified: Secondary | ICD-10-CM | POA: Diagnosis present

## 2017-12-20 DIAGNOSIS — K219 Gastro-esophageal reflux disease without esophagitis: Secondary | ICD-10-CM | POA: Diagnosis present

## 2017-12-20 DIAGNOSIS — M869 Osteomyelitis, unspecified: Secondary | ICD-10-CM | POA: Diagnosis not present

## 2017-12-20 DIAGNOSIS — Z85828 Personal history of other malignant neoplasm of skin: Secondary | ICD-10-CM | POA: Diagnosis not present

## 2017-12-20 DIAGNOSIS — L02416 Cutaneous abscess of left lower limb: Secondary | ICD-10-CM

## 2017-12-20 DIAGNOSIS — Z8546 Personal history of malignant neoplasm of prostate: Secondary | ICD-10-CM

## 2017-12-20 DIAGNOSIS — E1141 Type 2 diabetes mellitus with diabetic mononeuropathy: Secondary | ICD-10-CM | POA: Diagnosis present

## 2017-12-20 DIAGNOSIS — I2510 Atherosclerotic heart disease of native coronary artery without angina pectoris: Secondary | ICD-10-CM | POA: Diagnosis not present

## 2017-12-20 DIAGNOSIS — G4730 Sleep apnea, unspecified: Secondary | ICD-10-CM | POA: Diagnosis present

## 2017-12-20 DIAGNOSIS — Z87891 Personal history of nicotine dependence: Secondary | ICD-10-CM | POA: Diagnosis not present

## 2017-12-20 DIAGNOSIS — G4733 Obstructive sleep apnea (adult) (pediatric): Secondary | ICD-10-CM | POA: Diagnosis not present

## 2017-12-20 DIAGNOSIS — I129 Hypertensive chronic kidney disease with stage 1 through stage 4 chronic kidney disease, or unspecified chronic kidney disease: Secondary | ICD-10-CM | POA: Diagnosis present

## 2017-12-20 DIAGNOSIS — L03119 Cellulitis of unspecified part of limb: Secondary | ICD-10-CM | POA: Diagnosis present

## 2017-12-20 DIAGNOSIS — M86271 Subacute osteomyelitis, right ankle and foot: Secondary | ICD-10-CM | POA: Diagnosis present

## 2017-12-20 DIAGNOSIS — Z89421 Acquired absence of other right toe(s): Secondary | ICD-10-CM | POA: Diagnosis not present

## 2017-12-20 DIAGNOSIS — Z6836 Body mass index (BMI) 36.0-36.9, adult: Secondary | ICD-10-CM | POA: Diagnosis not present

## 2017-12-20 DIAGNOSIS — E1169 Type 2 diabetes mellitus with other specified complication: Secondary | ICD-10-CM | POA: Diagnosis not present

## 2017-12-20 DIAGNOSIS — S78112A Complete traumatic amputation at level between left hip and knee, initial encounter: Secondary | ICD-10-CM | POA: Diagnosis not present

## 2017-12-20 DIAGNOSIS — G8918 Other acute postprocedural pain: Secondary | ICD-10-CM

## 2017-12-20 DIAGNOSIS — Z794 Long term (current) use of insulin: Secondary | ICD-10-CM

## 2017-12-20 DIAGNOSIS — Z951 Presence of aortocoronary bypass graft: Secondary | ICD-10-CM

## 2017-12-20 DIAGNOSIS — Z89612 Acquired absence of left leg above knee: Secondary | ICD-10-CM | POA: Diagnosis not present

## 2017-12-20 DIAGNOSIS — I872 Venous insufficiency (chronic) (peripheral): Secondary | ICD-10-CM | POA: Diagnosis not present

## 2017-12-20 DIAGNOSIS — I10 Essential (primary) hypertension: Secondary | ICD-10-CM

## 2017-12-20 DIAGNOSIS — Z79891 Long term (current) use of opiate analgesic: Secondary | ICD-10-CM

## 2017-12-20 DIAGNOSIS — Y835 Amputation of limb(s) as the cause of abnormal reaction of the patient, or of later complication, without mention of misadventure at the time of the procedure: Secondary | ICD-10-CM | POA: Diagnosis present

## 2017-12-20 DIAGNOSIS — E7849 Other hyperlipidemia: Secondary | ICD-10-CM | POA: Diagnosis present

## 2017-12-20 DIAGNOSIS — E1040 Type 1 diabetes mellitus with diabetic neuropathy, unspecified: Secondary | ICD-10-CM | POA: Diagnosis not present

## 2017-12-20 DIAGNOSIS — Z89512 Acquired absence of left leg below knee: Secondary | ICD-10-CM | POA: Diagnosis not present

## 2017-12-20 DIAGNOSIS — Z79899 Other long term (current) drug therapy: Secondary | ICD-10-CM

## 2017-12-20 DIAGNOSIS — E1165 Type 2 diabetes mellitus with hyperglycemia: Secondary | ICD-10-CM

## 2017-12-20 DIAGNOSIS — D62 Acute posthemorrhagic anemia: Secondary | ICD-10-CM

## 2017-12-20 DIAGNOSIS — Z789 Other specified health status: Secondary | ICD-10-CM

## 2017-12-20 DIAGNOSIS — N179 Acute kidney failure, unspecified: Secondary | ICD-10-CM | POA: Diagnosis present

## 2017-12-20 DIAGNOSIS — N400 Enlarged prostate without lower urinary tract symptoms: Secondary | ICD-10-CM | POA: Diagnosis present

## 2017-12-20 DIAGNOSIS — L03116 Cellulitis of left lower limb: Secondary | ICD-10-CM

## 2017-12-20 DIAGNOSIS — S91301A Unspecified open wound, right foot, initial encounter: Secondary | ICD-10-CM | POA: Diagnosis not present

## 2017-12-20 DIAGNOSIS — E782 Mixed hyperlipidemia: Secondary | ICD-10-CM | POA: Diagnosis not present

## 2017-12-20 DIAGNOSIS — E1142 Type 2 diabetes mellitus with diabetic polyneuropathy: Secondary | ICD-10-CM | POA: Diagnosis not present

## 2017-12-20 DIAGNOSIS — I70261 Atherosclerosis of native arteries of extremities with gangrene, right leg: Secondary | ICD-10-CM | POA: Diagnosis present

## 2017-12-20 DIAGNOSIS — R0989 Other specified symptoms and signs involving the circulatory and respiratory systems: Secondary | ICD-10-CM | POA: Diagnosis not present

## 2017-12-20 DIAGNOSIS — E1151 Type 2 diabetes mellitus with diabetic peripheral angiopathy without gangrene: Secondary | ICD-10-CM | POA: Diagnosis present

## 2017-12-20 DIAGNOSIS — Z4781 Encounter for orthopedic aftercare following surgical amputation: Secondary | ICD-10-CM | POA: Diagnosis present

## 2017-12-20 DIAGNOSIS — Z6835 Body mass index (BMI) 35.0-35.9, adult: Secondary | ICD-10-CM | POA: Diagnosis not present

## 2017-12-20 DIAGNOSIS — Z888 Allergy status to other drugs, medicaments and biological substances status: Secondary | ICD-10-CM

## 2017-12-20 DIAGNOSIS — Z7982 Long term (current) use of aspirin: Secondary | ICD-10-CM

## 2017-12-20 DIAGNOSIS — Z801 Family history of malignant neoplasm of trachea, bronchus and lung: Secondary | ICD-10-CM

## 2017-12-20 DIAGNOSIS — Z87442 Personal history of urinary calculi: Secondary | ICD-10-CM

## 2017-12-20 DIAGNOSIS — E1149 Type 2 diabetes mellitus with other diabetic neurological complication: Secondary | ICD-10-CM | POA: Diagnosis present

## 2017-12-20 DIAGNOSIS — R7309 Other abnormal glucose: Secondary | ICD-10-CM | POA: Diagnosis not present

## 2017-12-20 DIAGNOSIS — R600 Localized edema: Secondary | ICD-10-CM | POA: Diagnosis not present

## 2017-12-20 DIAGNOSIS — E11621 Type 2 diabetes mellitus with foot ulcer: Secondary | ICD-10-CM | POA: Diagnosis present

## 2017-12-20 DIAGNOSIS — M159 Polyosteoarthritis, unspecified: Secondary | ICD-10-CM | POA: Diagnosis present

## 2017-12-20 DIAGNOSIS — M7989 Other specified soft tissue disorders: Secondary | ICD-10-CM | POA: Diagnosis not present

## 2017-12-20 DIAGNOSIS — E662 Morbid (severe) obesity with alveolar hypoventilation: Secondary | ICD-10-CM | POA: Diagnosis present

## 2017-12-20 DIAGNOSIS — K5901 Slow transit constipation: Secondary | ICD-10-CM | POA: Diagnosis not present

## 2017-12-20 DIAGNOSIS — T8744 Infection of amputation stump, left lower extremity: Principal | ICD-10-CM | POA: Diagnosis present

## 2017-12-20 DIAGNOSIS — L89899 Pressure ulcer of other site, unspecified stage: Secondary | ICD-10-CM | POA: Diagnosis not present

## 2017-12-20 DIAGNOSIS — M868X6 Other osteomyelitis, lower leg: Secondary | ICD-10-CM | POA: Diagnosis present

## 2017-12-20 DIAGNOSIS — L97519 Non-pressure chronic ulcer of other part of right foot with unspecified severity: Secondary | ICD-10-CM | POA: Diagnosis present

## 2017-12-20 DIAGNOSIS — Z820 Family history of epilepsy and other diseases of the nervous system: Secondary | ICD-10-CM

## 2017-12-20 DIAGNOSIS — E1122 Type 2 diabetes mellitus with diabetic chronic kidney disease: Secondary | ICD-10-CM | POA: Diagnosis present

## 2017-12-20 DIAGNOSIS — R509 Fever, unspecified: Secondary | ICD-10-CM | POA: Diagnosis not present

## 2017-12-20 DIAGNOSIS — M150 Primary generalized (osteo)arthritis: Secondary | ICD-10-CM | POA: Diagnosis not present

## 2017-12-20 DIAGNOSIS — Z8249 Family history of ischemic heart disease and other diseases of the circulatory system: Secondary | ICD-10-CM

## 2017-12-20 DIAGNOSIS — E46 Unspecified protein-calorie malnutrition: Secondary | ICD-10-CM | POA: Diagnosis not present

## 2017-12-20 DIAGNOSIS — I739 Peripheral vascular disease, unspecified: Secondary | ICD-10-CM | POA: Diagnosis not present

## 2017-12-20 DIAGNOSIS — E1159 Type 2 diabetes mellitus with other circulatory complications: Secondary | ICD-10-CM | POA: Diagnosis not present

## 2017-12-20 DIAGNOSIS — K5900 Constipation, unspecified: Secondary | ICD-10-CM | POA: Diagnosis present

## 2017-12-20 DIAGNOSIS — I70262 Atherosclerosis of native arteries of extremities with gangrene, left leg: Secondary | ICD-10-CM | POA: Diagnosis not present

## 2017-12-20 DIAGNOSIS — E785 Hyperlipidemia, unspecified: Secondary | ICD-10-CM | POA: Diagnosis present

## 2017-12-20 HISTORY — DX: Cellulitis of left lower limb: L03.116

## 2017-12-20 HISTORY — DX: Cutaneous abscess of left lower limb: L02.416

## 2017-12-20 LAB — CBC WITH DIFFERENTIAL/PLATELET
Abs Immature Granulocytes: 0.78 10*3/uL — ABNORMAL HIGH (ref 0.00–0.07)
Basophils Absolute: 0.1 10*3/uL (ref 0.0–0.1)
Basophils Relative: 1 %
EOS PCT: 3 %
Eosinophils Absolute: 0.4 10*3/uL (ref 0.0–0.5)
HCT: 43.9 % (ref 39.0–52.0)
Hemoglobin: 13.3 g/dL (ref 13.0–17.0)
Immature Granulocytes: 5 %
Lymphocytes Relative: 7 %
Lymphs Abs: 1 10*3/uL (ref 0.7–4.0)
MCH: 26.2 pg (ref 26.0–34.0)
MCHC: 30.3 g/dL (ref 30.0–36.0)
MCV: 86.4 fL (ref 80.0–100.0)
MONOS PCT: 6 %
Monocytes Absolute: 0.9 10*3/uL (ref 0.1–1.0)
Neutro Abs: 12 10*3/uL — ABNORMAL HIGH (ref 1.7–7.7)
Neutrophils Relative %: 78 %
Platelets: 366 10*3/uL (ref 150–400)
RBC: 5.08 MIL/uL (ref 4.22–5.81)
RDW: 16.1 % — ABNORMAL HIGH (ref 11.5–15.5)
WBC: 15.2 10*3/uL — ABNORMAL HIGH (ref 4.0–10.5)
nRBC: 0 % (ref 0.0–0.2)

## 2017-12-20 LAB — BASIC METABOLIC PANEL
Anion gap: 12 (ref 5–15)
BUN: 25 mg/dL — AB (ref 8–23)
CO2: 25 mmol/L (ref 22–32)
Calcium: 9.2 mg/dL (ref 8.9–10.3)
Chloride: 101 mmol/L (ref 98–111)
Creatinine, Ser: 1.22 mg/dL (ref 0.61–1.24)
GFR calc Af Amer: >60 mL/min (ref >60)
GFR calc non Af Amer: >60 mL/min (ref >60)
Glucose, Bld: 182 mg/dL — ABNORMAL HIGH (ref 70–99)
Potassium: 4.7 mmol/L (ref 3.5–5.1)
Sodium: 138 mmol/L (ref 135–145)

## 2017-12-20 LAB — CBC
HCT: 41.9 % (ref 39.0–52.0)
Hemoglobin: 13.2 g/dL (ref 13.0–17.0)
MCH: 26.6 pg (ref 26.0–34.0)
MCHC: 31.5 g/dL (ref 30.0–36.0)
MCV: 84.5 fL (ref 80.0–100.0)
Platelets: 347 10*3/uL (ref 150–400)
RBC: 4.96 MIL/uL (ref 4.22–5.81)
RDW: 16.1 % — ABNORMAL HIGH (ref 11.5–15.5)
WBC: 14.3 10*3/uL — ABNORMAL HIGH (ref 4.0–10.5)
nRBC: 0 % (ref 0.0–0.2)

## 2017-12-20 LAB — GLUCOSE, CAPILLARY: GLUCOSE-CAPILLARY: 143 mg/dL — AB (ref 70–99)

## 2017-12-20 LAB — CREATININE, SERUM
Creatinine, Ser: 1.16 mg/dL (ref 0.61–1.24)
GFR calc Af Amer: >60 mL/min (ref >60)
GFR calc non Af Amer: >60 mL/min (ref >60)

## 2017-12-20 LAB — I-STAT CG4 LACTIC ACID, ED: Lactic Acid, Venous: 1.36 mmol/L (ref 0.5–1.9)

## 2017-12-20 LAB — CBG MONITORING, ED: GLUCOSE-CAPILLARY: 149 mg/dL — AB (ref 70–99)

## 2017-12-20 MED ORDER — ASPIRIN 325 MG PO TABS
325.0000 mg | ORAL_TABLET | Freq: Every day | ORAL | Status: DC
  Administered 2017-12-20 – 2017-12-26 (×6): 325 mg via ORAL
  Filled 2017-12-20 (×6): qty 1

## 2017-12-20 MED ORDER — SODIUM CHLORIDE 0.9% FLUSH: 3.0000 mL | INTRAVENOUS | Status: DC | PRN

## 2017-12-20 MED ORDER — PIPERACILLIN-TAZOBACTAM 3.375 G IVPB 30 MIN
3.3750 g | Freq: Once | INTRAVENOUS | Status: AC
  Administered 2017-12-20: 3.375 g via INTRAVENOUS
  Filled 2017-12-20: qty 50

## 2017-12-20 MED ORDER — VANCOMYCIN HCL IN DEXTROSE 1-5 GM/200ML-% IV SOLN
1000.0000 mg | Freq: Once | INTRAVENOUS | Status: AC
  Administered 2017-12-20: 1000 mg via INTRAVENOUS
  Filled 2017-12-20: qty 200

## 2017-12-20 MED ORDER — SODIUM CHLORIDE 0.9% FLUSH
3.0000 mL | Freq: Two times a day (BID) | INTRAVENOUS | Status: DC
  Administered 2017-12-21 – 2017-12-26 (×8): 3 mL via INTRAVENOUS

## 2017-12-20 MED ORDER — SODIUM CHLORIDE 0.9 % IV SOLN: 250.0000 mL | INTRAVENOUS | Status: DC | PRN

## 2017-12-20 MED ORDER — ENOXAPARIN SODIUM 40 MG/0.4ML ~~LOC~~ SOLN
40.0000 mg | SUBCUTANEOUS | Status: DC
  Administered 2017-12-20 – 2017-12-25 (×6): 40 mg via SUBCUTANEOUS
  Filled 2017-12-20 (×7): qty 0.4

## 2017-12-20 MED ORDER — INSULIN ASPART 100 UNIT/ML ~~LOC~~ SOLN
0.0000 [IU] | Freq: Three times a day (TID) | SUBCUTANEOUS | Status: DC
  Administered 2017-12-20: 2 [IU] via SUBCUTANEOUS
  Administered 2017-12-21: 8 [IU] via SUBCUTANEOUS
  Administered 2017-12-21: 2 [IU] via SUBCUTANEOUS
  Administered 2017-12-22 (×3): 3 [IU] via SUBCUTANEOUS
  Administered 2017-12-23: 5 [IU] via SUBCUTANEOUS
  Administered 2017-12-23 (×2): 3 [IU] via SUBCUTANEOUS
  Administered 2017-12-24: 5 [IU] via SUBCUTANEOUS
  Administered 2017-12-24: 3 [IU] via SUBCUTANEOUS
  Administered 2017-12-25: 11 [IU] via SUBCUTANEOUS
  Administered 2017-12-25: 5 [IU] via SUBCUTANEOUS
  Administered 2017-12-25: 8 [IU] via SUBCUTANEOUS
  Administered 2017-12-26: 2 [IU] via SUBCUTANEOUS
  Administered 2017-12-26: 5 [IU] via SUBCUTANEOUS

## 2017-12-20 MED ORDER — HYDROCODONE-ACETAMINOPHEN 5-325 MG PO TABS
1.0000 | ORAL_TABLET | ORAL | Status: DC | PRN
  Administered 2017-12-22 – 2017-12-25 (×2): 1 via ORAL
  Filled 2017-12-20 (×3): qty 1

## 2017-12-20 NOTE — Plan of Care (Signed)

## 2017-12-20 NOTE — Plan of Care (Signed)

## 2017-12-20 NOTE — ED Triage Notes (Signed)
Patient to ED c/o infection to L leg (L BKA). Patient reports increased erythema and new purulent drainage as well as fevers overnight.

## 2017-12-20 NOTE — H&P (Signed)
History and Physical  ROMYN [name] PPI:[id number] DOB: Dec 13, 1950 DOA: 12/20/2017  Referring physician:Kendrick, Cecilie Kicks, PA-C  PCP: [name], MD  Outpatient Specialists:  Patient coming from: Home & is able to ambulate   Chief Complaint: Wound infection on the left foot stump  HPI: [name] is a 67 y.o. male with medical history significant for diabetes hypertension hyperlipidemia left below-knee amputation with chronic stump infection that got worse they have been treating it since November 15 with oral antibiotic as outpatient he had some doxycycline he has had about 2 courses of antibiotics.  He did not get any better he had quickly noted increased pain and redness in the left foot stump he said he had chills fever and was sweating.   ED Course: He was started on vancomycin and Zosyn also IV fluid rehydration was given  Review of Systems: . Pt complains of fever chills pain and redness to the left lower extremity  Pt denies any diarrhea chest pain headache abdominal pain.  Review of systems are otherwise negative   Past Medical History:  Diagnosis Date  . Coronary atherosclerosis of unspecified type of vessel, native or graft   . Diabetes mellitus without complication ([place])   . Hemorrhage of rectum and anus   . History of kidney stones   . Impotence of organic origin   . Internal hemorrhoids without mention of complication   . Malignant neoplasm of prostate ([place])   . Mononeuritis of unspecified site   . Osteoarthrosis, unspecified whether generalized or localized, unspecified site   . Other and unspecified hyperlipidemia   . Personal history of colonic polyps   . Personal history of diabetic foot ulcer    saw wound center, resolved 05/08/2010  . Personal history of gallstones   . Routine general medical examination at a health care facility   . Type II or unspecified type diabetes mellitus with neurological manifestations, not stated as uncontrolled(250.60)    . Unspecified glaucoma(365.9)   . Unspecified sleep apnea    cpcp  . Unspecified venous (peripheral) insufficiency    Past Surgical History:  Procedure Laterality Date  . AMPUTATION Left 12/30/2012   Procedure: AMPUTATION RAY ;  Surgeon: Meredith Pel, MD;  Location: [facility];  Service: Orthopedics;  Laterality: Left;  LEFT GREAT TOE RAY AMPUTATION  . AMPUTATION Left 02/23/2016   Procedure: Left Below Knee Amputation;  Surgeon: [name], MD;  Location: [place];  Service: Orthopedics;  Laterality: Left;  . APPENDECTOMY    . BASAL CELL CARCINOMA EXCISION  2/16   left forearm  . CARDIAC CATHETERIZATION  1998   Negative  . CATARACT EXTRACTION Right 2017   then lid surgery  . CORONARY ARTERY BYPASS GRAFT  09/2005   Post op AFIB  . EYE SURGERY    . FOOT BONE EXCISION Left 11/03/2013   DR DUDA   . I&D EXTREMITY Left 11/03/2013   Procedure: Left Foot Partial Bone Excision Cuboid and Medial Cuneiform, Wound Closures;  Surgeon: [name], MD;  Location: [place];  Service: Orthopedics;  Laterality: Left;  . INSERTION PROSTATE RADIATION SEED  2009   RT and seeds for prostate cancer  . KIDNEY STONE SURGERY  04/1993  . RCA stents  04/2003   EF 55%  . RETINAL DETACHMENT SURGERY  2002-2003  . thrombosed vein  1993   Right leg    Social History:  reports that he quit smoking about 16 years ago. His smoking use included  cigars and cigarettes. He has a 70.00 pack-year smoking history. He has never used smokeless tobacco. He reports current alcohol use. He reports that he does not use drugs.   Allergies  Allergen Reactions  . Atorvastatin Other (See Comments)    REACTION: myalgias  . Ezetimibe Other (See Comments)    Body ache  . Simvastatin Other (See Comments)    Body ache    Family History  Problem Relation Age of Onset  . Lung cancer Father   . Multiple sclerosis Mother   . Heart attack Other        paternal aunts and uncles  . Peripheral vascular disease Maternal  Grandfather        several amputations      Prior to Admission medications   Medication Sig Start Date End Date Taking? Authorizing Provider  Amino Acids-Protein Hydrolys (FEEDING SUPPLEMENT, PRO-STAT SUGAR FREE 64,) LIQD Take 30 mLs by mouth 3 (three) times daily with meals.   Yes [provider]  aspirin 325 MG tablet Take 325 mg by mouth daily.     Yes [provider]  carvedilol (COREG) 25 MG tablet TAKE 1 TABLET TWICE DAILY Patient taking differently: Take 25 mg by mouth 2 (two) times daily with a meal.  05/28/17  Yes [name], MD  doxycycline (VIBRAMYCIN) 100 MG capsule Take 1 capsule (100 mg total) by mouth 2 (two) times daily. Patient taking differently: Take 100 mg by mouth 2 (two) times daily. 14 day supply 50/5/69  Yes Delora Fuel, MD  HYDROcodone-acetaminophen [facility]) 5-325 MG tablet Take 1 tablet by mouth every 4 (four) hours as needed for moderate pain. 79/4/80  Yes Delora Fuel, MD  insulin aspart (NOVOLOG FLEXPEN) 100 UNIT/ML FlexPen INJECT&nbsp;&nbsp;30&nbsp;&nbsp;TO&nbsp;&nbsp;35 UNITS SUBCUTANEOUSLY THREE TIMES DAILY WITH MEALS Patient taking differently: Inject 35 Units into the skin 3 (three) times daily with meals.  11/11/17  Yes Philemon Kingdom, MD  Insulin Glargine (TOUJEO SOLOSTAR) 300 UNIT/ML SOPN Inject 60 Units into the skin daily. 03/06/17  Yes Philemon Kingdom, MD  losartan-hydrochlorothiazide (HYZAAR) 50-12.5 MG tablet TAKE 1 TABLET EVERY DAY Patient taking differently: Take 1 tablet by mouth daily.  07/04/17  Yes [name], MD  metFORMIN (GLUCOPHAGE) 1000 MG tablet Take 1 tablet (1,000 mg total) by mouth daily. Patient taking differently: Take 1,000 mg by mouth every evening.  03/06/17  Yes Philemon Kingdom, MD  mupirocin ointment (BACTROBAN) 2 % Apply to wound daily Patient taking differently: Apply 1 application topically daily as needed (wound healing). Apply to wound daily 05/31/16  Yes Dondra Prader R, NP  nitroGLYCERIN  (NITRODUR - DOSED IN MG/24 HR) 0.2 mg/hr patch PLACE 1 PATCH (0.2 MG TOTAL) DAILY ONTO THE SKIN. Patient taking differently: Place 0.2 mg onto the skin daily. On left leg 12/08/17  Yes [name], MD  potassium citrate (UROCIT-K) 10 MEQ (1080 MG) SR tablet Take 20 mEq by mouth 2 (two) times daily.  03/30/14  Yes [provider]  Probiotic Product (PROBIOTIC DAILY PO) Take 1 capsule by mouth daily.   Yes [provider]  tamsulosin (FLOMAX) 0.4 MG CAPS capsule Take 1 capsule (0.4 mg total) by mouth daily. 16/5/53  Yes Delora Fuel, MD  triamcinolone (KENALOG) 0.025 % cream APPLY 1 APPLICATION TOPICALLY 3 (THREE) TIMES DAILY AS NEEDED. Patient taking differently: Apply 1 application topically 3 (three) times daily as needed (rash on back).  02/22/14  Yes [name], MD  vitamin C (ASCORBIC ACID) 500 MG tablet Take 500  mg by mouth 2 (two) times daily.   Yes [provider]  zinc gluconate 50 MG tablet Take 50 mg by mouth daily.   Yes [provider]  ACCU-CHEK FASTCLIX LANCETS MISC Use to check sugar 3 times daily dx- E11.42 07/03/16   Philemon Kingdom, MD  Blood Glucose Monitoring Suppl (ACCU-CHEK GUIDE) w/Device KIT 1 kit by Other route 3 (three) times daily. Use to check blood sugars 3 times daily, Dx Code G99.24 07/09/16   Philemon Kingdom, MD  DROPLET PEN NEEDLES 31G X 5 MM MISC USE TO INJECT INSULIN FOUR TIMES DAILY AS INSTRUCTED (FOR DIABETES) 11/18/17   Philemon Kingdom, MD  glucose blood (ACCU-CHEK GUIDE) test strip Use as instructed to check sugar 3 times daily dx- E11.42 07/03/16   Philemon Kingdom, MD  ondansetron (ZOFRAN) 4 MG tablet Take 1 tablet (4 mg total) by mouth every 6 (six) hours as needed for nausea or vomiting. Patient not taking: Reported on 26/83/4196 22/2/97   Delora Fuel, MD  pentoxifylline (TRENTAL) 400 MG CR tablet Take 1 tablet (400 mg total) by mouth 3 (three) times daily with meals. Patient not taking: Reported on 12/20/2017  05/05/17   [name], MD    Physical Exam: BP 131/68   Pulse 80   Temp (!) 97.5 F (36.4 C) (Oral)   Resp 18   SpO2 98%   Exam:  . General: 67 y.o. year-old male well developed well nourished in no acute distress.  Alert and oriented x3. . Cardiovascular: Regular rate and rhythm with no rubs or gallops.  No thyromegaly or JVD noted.   Marland Kitchen Respiratory: Clear to auscultation with no wheezes or rales. Good inspiratory effort. . Abdomen: Soft nontender nondistended with normal bowel sounds x4 quadrants. . Musculoskeletal: No lower extremity edema.  Palpable pulses on the right dorsalis pedis, left BKA with cellulitis on the stump multiple ulcer on the stump also redness and warmth on the inner aspect of the left knee and left lower inner thigh.  Hard eschar on the right foot laterally and on the fifth right toe laterally . Skin: No ulcerative lesions noted or rashes, . Psychiatry: Mood is appropriate for condition and setting           Labs on Admission:  Basic Metabolic Panel: Recent Labs  Lab 12/14/17 0006 12/20/17 1014  NA 133* 138  K 4.4 4.7  CL 98 101  CO2 23 25  GLUCOSE 171* 182*  BUN 31* 25*  CREATININE 1.33* 1.22  CALCIUM 9.0 9.2   Liver Function Tests: Recent Labs  Lab 12/14/17 0006  AST 14*  ALT 15  ALKPHOS 39  BILITOT 1.3*  PROT 6.7  ALBUMIN 3.5   Recent Labs  Lab 12/14/17 0006  LIPASE 26   No results for input(s): AMMONIA in the last 168 hours. CBC: Recent Labs  Lab 12/14/17 0006 12/20/17 1014  WBC 15.1* 15.2*  NEUTROABS 12.9* 12.0*  HGB 13.0 13.3  HCT 40.4 43.9  MCV 84.9 86.4  PLT 226 366   Cardiac Enzymes: No results for input(s): CKTOTAL, CKMB, CKMBINDEX, TROPONINI in the last 168 hours.  BNP (last 3 results) No results for input(s): BNP in the last 8760 hours.  ProBNP (last 3 results) No results for input(s): PROBNP in the last 8760 hours.  CBG: No results for input(s): GLUCAP in the last 168 hours.  Radiological Exams on  Admission: Dg Knee Complete 4 Views Left  Result Date: 12/20/2017 CLINICAL DATA:  LEFT lower extremity infection,  purulent range and fever overnight, increased erythema, prior BKA EXAM: LEFT KNEE - COMPLETE 4+ VIEW COMPARISON:  None FINDINGS: Post BKA. Osseous demineralization. Joint space narrowing LEFT knee. No acute fracture, dislocation, or bone destruction. Soft tissue swelling at stump. No definite soft tissue gas. Scattered atherosclerotic calcifications. IMPRESSION: Osseous mineralization LEFT knee with degenerative changes and prior LEFT BKA. Soft tissue swelling at stump without acute osseous abnormalities. Electronically Signed   By: Lavonia Dana M.D.   On: 12/20/2017 11:50   Dg Foot Complete Right  Result Date: 12/20/2017 CLINICAL DATA:  Wound on lateral side of RIGHT foot, diabetes mellitus EXAM: RIGHT FOOT COMPLETE - 3+ VIEW COMPARISON:  None FINDINGS: Osseous demineralization. Joint spaces preserved. Soft tissue wound with small amount of gas identified at the lateral margin of the fifth MTP joint. No fracture, dislocation, or bone destruction. Plantar and Achilles insertion calcaneal spur formation. IMPRESSION: No acute osseous abnormalities. Electronically Signed   By: Lavonia Dana M.D.   On: 12/20/2017 11:52    EKG: Independently reviewed.  None  Assessment/Plan Present on Admission: . Hyperlipemia . Coronary atherosclerosis of native coronary artery . Venous (peripheral) insufficiency . Osteoarthritis, multiple sites . Obstructive sleep apnea . Left below-knee amputee ([place]) . PAD (peripheral artery disease) ([place]) . Chronic renal disease, stage III ([place]) . Cellulitis of extremity  Principal Problem:   Cellulitis of extremity Active Problems:   Hyperlipemia   Coronary atherosclerosis of native coronary artery   Venous (peripheral) insufficiency   Osteoarthritis, multiple sites   Obstructive sleep apnea   Type 2 diabetes mellitus with diabetic polyneuropathy, with  long-term current use of insulin ([facility])   Left below-knee amputee ([place])   PAD (peripheral artery disease) ([place])   Chronic renal disease, stage III ([place])   1.  Cellulitis of the left stump without osteomyelitis failed outpatient antibiotics.  Patient was started on Zosyn and vancomycin in the ED will continue that.  2.  Leukocytosis most likely secondary to the cellulitis continue antibiotics  3.  Type 2 diabetes mellitus slightly uncontrolled we will continue sliding scale and insulin  4.  Lower extremity neuropathy secondary to type 2 diabetes mellitus  5.  Chronic kidney disease stage III IV hydration gently   Severity of Illness: The appropriate patient status for this patient is INPATIENT. Inpatient status is judged to be reasonable and necessary in order to provide the required intensity of service to ensure the patient's safety. The patient's presenting symptoms, physical exam findings, and initial radiographic and laboratory data in the context of their chronic comorbidities is felt to place them at high risk for further clinical deterioration. Furthermore, it is not anticipated that the patient will be medically stable for discharge from the hospital within 2 midnights of admission. The following factors support the patient status of inpatient.   " The patient's presenting symptoms include cellulitis of left stump limb threatening. " The worrisome physical exam findings include multiple ulcers cellulitis erythema of the stump and chronic ulcer on the right. " The initial radiographic and laboratory data are worrisome because of no osteomyelitis but leukocytosis.  Patient failed outpatient antibiotic treatment " The chronic co-morbidities include diabetes mellitus.   * I certify that at the point of admission it is my clinical judgment that the patient will require inpatient hospital care spanning beyond 2 midnights from the point of admission due to high intensity of service, high  risk for further deterioration and high frequency of surveillance required.*Patient failed outpatient antibiotic treatment requiring inpatient  antibiotics IV    DVT prophylaxis: Lovenox  Code Status: Full  Family Communication: Daughter [name] at bedside  Disposition Plan: Home when stable  Consults called: None  Admission status: Inpatient for IV antibiotics    Cristal Deer MD [facility] Pager [phone]  If 7PM-7AM, please contact night-coverage [email or web address] Password [facility]  12/20/2017, 2:24 PM

## 2017-12-20 NOTE — ED Provider Notes (Signed)
[facility] Provider Note   CSN: [id number] Arrival date & time: 12/20/17  0915     History   Chief Complaint Chief Complaint  Patient presents with  . Wound Infection    HPI [name] is a 67 y.o. male.  HPI  66 year old male, with past medical history of diabetes, HTN, HLD, left below-knee amputation, presents with a wound to the left stump.  Patient states he has a chronic ulcer to the anterior aspect of the left stump.  Yesterday he started noting discharge from his chronic ulcer and to the medial aspect of the left stump.  He also notes increased redness and pain.  He states a subjective fever last night with associated night sweats.  He denies any nausea, vomiting, fevers, chills, cough, sputum production.  He denies any chest pain, shortness of breath.  He has been on doxycycline for the last 7 days for UTI.  He states he has been compliant with the doxycycline.  Past Medical History:  Diagnosis Date  . Coronary atherosclerosis of unspecified type of vessel, native or graft   . Diabetes mellitus without complication ([place])   . Hemorrhage of rectum and anus   . History of kidney stones   . Impotence of organic origin   . Internal hemorrhoids without mention of complication   . Malignant neoplasm of prostate ([place])   . Mononeuritis of unspecified site   . Osteoarthrosis, unspecified whether generalized or localized, unspecified site   . Other and unspecified hyperlipidemia   . Personal history of colonic polyps   . Personal history of diabetic foot ulcer    saw wound center, resolved 05/08/2010  . Personal history of gallstones   . Routine general medical examination at a health care facility   . Type II or unspecified type diabetes mellitus with neurological manifestations, not stated as uncontrolled(250.60)   . Unspecified glaucoma(365.9)   . Unspecified sleep apnea    cpcp  . Unspecified venous (peripheral) insufficiency      Patient Active Problem List   Diagnosis Date Noted  . PAD (peripheral artery disease) ([place]) 02/19/2017  . Chronic renal disease, stage III ([place]) 02/19/2017  . Morbid obesity ([place]) 11/05/2016  . Ulcer of toe of right foot, limited to breakdown of skin ([place]) 10/11/2016  . Non-pressure chronic ulcer of left calf, limited to breakdown of skin ([place]) 06/04/2016  . Idiopathic chronic venous hypertension of right lower extremity with ulcer and inflammation ([place]) 06/04/2016  . Left below-knee amputee ([place]) 02/23/2016  . Type 2 diabetes mellitus with diabetic polyneuropathy, with long-term current use of insulin ([place]) 03/29/2015  . Advance directive discussed with patient 02/07/2014  . Normocytic anemia 04/01/2013  . Diabetic Charcot foot ([place]) 04/01/2013  . Routine general medical examination at a health care facility 12/25/2010  . HEMORRHOIDS-INTERNAL 11/22/2009  . PERSONAL HX COLONIC POLYPS 11/22/2009  . Venous (peripheral) insufficiency 06/29/2008  . Osteoarthritis, multiple sites 04/03/2007  . ERECTILE DYSFUNCTION, ORGANIC 06/13/2006  . Hyperlipemia 06/11/2006  . GLAUCOMA 06/11/2006  . Coronary atherosclerosis of native coronary artery 06/11/2006  . Obstructive sleep apnea 06/11/2006    Past Surgical History:  Procedure Laterality Date  . AMPUTATION Left 12/30/2012   Procedure: AMPUTATION RAY ;  Surgeon: Meredith Pel, MD;  Location: [facility];  Service: Orthopedics;  Laterality: Left;  LEFT GREAT TOE RAY AMPUTATION  . AMPUTATION Left 02/23/2016   Procedure: Left Below Knee Amputation;  Surgeon: [name], MD;  Location: [facility];  Service: Orthopedics;  Laterality: Left;  . APPENDECTOMY    . BASAL CELL CARCINOMA EXCISION  2/16   left forearm  . CARDIAC CATHETERIZATION  1998   Negative  . CATARACT EXTRACTION Right 2017   then lid surgery  . CORONARY ARTERY BYPASS GRAFT  09/2005   Post op AFIB  . EYE SURGERY    . FOOT BONE EXCISION Left 11/03/2013   DR DUDA   . I&D  EXTREMITY Left 11/03/2013   Procedure: Left Foot Partial Bone Excision Cuboid and Medial Cuneiform, Wound Closures;  Surgeon: [name], MD;  Location: [facility];  Service: Orthopedics;  Laterality: Left;  . INSERTION PROSTATE RADIATION SEED  2009   RT and seeds for prostate cancer  . KIDNEY STONE SURGERY  04/1993  . RCA stents  04/2003   EF 55%  . RETINAL DETACHMENT SURGERY  2002-2003  . thrombosed vein  1993   Right leg        Home Medications    Prior to Admission medications   Medication Sig Start Date End Date Taking? Authorizing Provider  ACCU-CHEK FASTCLIX LANCETS MISC Use to check sugar 3 times daily dx- E11.42 07/03/16   Philemon Kingdom, MD  aspirin 325 MG tablet Take 325 mg by mouth daily.      [provider]  Blood Glucose Monitoring Suppl (ACCU-CHEK GUIDE) w/Device KIT 1 kit by Other route 3 (three) times daily. Use to check blood sugars 3 times daily, Dx Code K44.01 07/09/16   Philemon Kingdom, MD  carvedilol (COREG) 25 MG tablet TAKE 1 TABLET TWICE DAILY 05/28/17   [name], MD  doxycycline (VIBRAMYCIN) 100 MG capsule Take 1 capsule (100 mg total) by mouth 2 (two) times daily. 02/14/23   Delora Fuel, MD  DROPLET PEN NEEDLES 31G X 5 MM MISC USE TO INJECT INSULIN FOUR TIMES DAILY AS INSTRUCTED (FOR DIABETES) 11/18/17   Philemon Kingdom, MD  glucose blood (ACCU-CHEK GUIDE) test strip Use as instructed to check sugar 3 times daily dx- E11.42 07/03/16   Philemon Kingdom, MD  HYDROcodone-acetaminophen (NORCO) 5-325 MG tablet Take 1 tablet by mouth every 4 (four) hours as needed for moderate pain. 36/6/44   Delora Fuel, MD  insulin aspart (NOVOLOG FLEXPEN) 100 UNIT/ML FlexPen INJECT&nbsp;&nbsp;30&nbsp;&nbsp;TO&nbsp;&nbsp;35 UNITS SUBCUTANEOUSLY THREE TIMES DAILY WITH MEALS 11/11/17   Philemon Kingdom, MD  Insulin Glargine (TOUJEO SOLOSTAR) 300 UNIT/ML SOPN Inject 60 Units into the skin daily. 03/06/17   Philemon Kingdom, MD  losartan-hydrochlorothiazide [facility])  50-12.5 MG tablet TAKE 1 TABLET EVERY DAY 07/04/17   Viviana Simpler I, MD  metFORMIN (GLUCOPHAGE) 1000 MG tablet Take 1 tablet (1,000 mg total) by mouth daily. 03/06/17   Philemon Kingdom, MD  mupirocin ointment (BACTROBAN) 2 % Apply to wound daily Patient taking differently: Apply 1 application topically daily as needed (wound healing). Apply to wound daily 05/31/16   Suzan Slick, NP  nitroGLYCERIN (NITRODUR - DOSED IN MG/24 HR) 0.2 mg/hr patch PLACE 1 PATCH (0.2 MG TOTAL) DAILY ONTO THE SKIN. 12/08/17   [name], MD  ondansetron (ZOFRAN) 4 MG tablet Take 1 tablet (4 mg total) by mouth every 6 (six) hours as needed for nausea or vomiting. 03/10/72   Delora Fuel, MD  pentoxifylline (TRENTAL) 400 MG CR tablet Take 1 tablet (400 mg total) by mouth 3 (three) times daily with meals. 05/05/17   [name], MD  potassium citrate (UROCIT-K) 10 MEQ (1080 MG) SR tablet Take 20 mEq by mouth 2 (two) times daily.  03/30/14   [provider]  Probiotic Product (PROBIOTIC DAILY PO) Take 1 capsule by mouth daily.    [provider]  tamsulosin (FLOMAX) 0.4 MG CAPS capsule Take 1 capsule (0.4 mg total) by mouth daily. 29/9/37   Delora Fuel, MD  triamcinolone (KENALOG) 0.025 % cream APPLY 1 APPLICATION TOPICALLY 3 (THREE) TIMES DAILY AS NEEDED. Patient taking differently: APPLY 1 APPLICATION TOPICALLY 3 (THREE) TIMES DAILY AS NEEDED FOR DRY SKIN AND RASH 02/22/14   [name], MD    Family History Family History  Problem Relation Age of Onset  . Lung cancer Father   . Multiple sclerosis Mother   . Heart attack Other        paternal aunts and uncles  . Peripheral vascular disease Maternal Grandfather        several amputations    Social History Social History   Tobacco Use  . Smoking status: Former Smoker    Packs/day: 2.00    Years: 35.00    Pack years: 70.00    Types: Cigars, Cigarettes    Last attempt to quit: 01/07/2001    Years since quitting: 16.9  . Smokeless  tobacco: Never Used  Substance Use Topics  . Alcohol use: Yes    Comment: rare  . Drug use: No     Allergies   Atorvastatin; Ezetimibe; and Simvastatin   Review of Systems Review of Systems  Constitutional: Positive for fever (subjective). Negative for chills.  HENT: Negative for rhinorrhea and sore throat.   Eyes: Negative for visual disturbance.  Respiratory: Negative for cough and shortness of breath.   Cardiovascular: Negative for chest pain and leg swelling.  Gastrointestinal: Negative for abdominal pain, diarrhea, nausea and vomiting.  Genitourinary: Negative for dysuria, frequency and urgency.  Musculoskeletal: Positive for arthralgias (left stump pain). Negative for joint swelling and neck pain.  Skin: Positive for wound (left stump). Negative for rash.  Neurological: Negative for syncope and numbness.  All other systems reviewed and are negative.    Physical Exam Updated Vital Signs BP (!) 147/52 (BP Location: Left Arm)   Pulse 85   Temp (!) 97.5 F (36.4 C) (Oral)   Resp 18   SpO2 97%   Physical Exam Vitals signs and nursing note reviewed.  Constitutional:      Appearance: He is well-developed.  HENT:     Head: Normocephalic and atraumatic.  Eyes:     Conjunctiva/sclera: Conjunctivae normal.  Neck:     Musculoskeletal: Neck supple.  Cardiovascular:     Rate and Rhythm: Normal rate and regular rhythm.     Heart sounds: Normal heart sounds. No murmur.  Pulmonary:     Effort: Pulmonary effort is normal. No respiratory distress.     Breath sounds: Normal breath sounds. No wheezing or rales.  Abdominal:     General: Bowel sounds are normal. There is no distension.     Palpations: Abdomen is soft.     Tenderness: There is no abdominal tenderness.  Musculoskeletal: Normal range of motion.        General: No tenderness or deformity.       Legs:  Skin:    General: Skin is warm and dry.     Findings: No erythema or rash.  Neurological:     Mental  Status: He is alert and oriented to person, place, and time.  Psychiatric:        Behavior: Behavior normal.      ED Treatments / Results  Labs (all labs ordered are listed, but only abnormal results are displayed) Labs Reviewed  CBC WITH DIFFERENTIAL/PLATELET - Abnormal; Notable for the following components:      Result Value   WBC 15.2 (*)    RDW 16.1 (*)    Neutro Abs 12.0 (*)    Abs Immature Granulocytes 0.78 (*)    All other components within normal limits  CULTURE, BLOOD (ROUTINE X 2)  CULTURE, BLOOD (ROUTINE X 2)  BASIC METABOLIC PANEL  I-STAT CG4 LACTIC ACID, ED    EKG None  Radiology No results found.  Procedures Procedures (including critical care time)  Medications Ordered in ED Medications  vancomycin (VANCOCIN) IVPB 1000 mg/200 mL premix (has no administration in time range)  piperacillin-tazobactam (ZOSYN) IVPB 3.375 g (3.375 g Intravenous New Bag/Given 12/20/17 1054)     Initial Impression / Assessment and Plan / ED Course  I have reviewed the triage vital signs and the nursing notes.  Pertinent labs & imaging results that were available during my care of the patient were reviewed by me and considered in my medical decision making (see chart for details).     Patient resting comfortably in bed, no acute distress, nontoxic, non-lethargic.  Vital signs stable.  Patient not tachycardic, afebrile.  He notes a subjective fever.  He has noted cellulitis of the left BKA a sending on pharyngitis.  Patient has been on doxycycline however redness has worsened.  Patient given Vanco Zosyn in the ED.  No evidence of osteomyelitis on x-ray.  Will discuss admission with the hospitalist.  Discussed case with attending, Dr. Darl Householder, was agreeable with plan.  Final Clinical Impressions(s) / ED Diagnoses   Final diagnoses:  None    ED Discharge Orders    None       Etter Sjogren, PA-C 12/21/17 1150    Drenda Freeze, MD 12/23/17 [phone]

## 2017-12-21 ENCOUNTER — Inpatient Hospital Stay (HOSPITAL_COMMUNITY): Payer: Medicare Other

## 2017-12-21 DIAGNOSIS — Z789 Other specified health status: Secondary | ICD-10-CM

## 2017-12-21 DIAGNOSIS — T8744 Infection of amputation stump, left lower extremity: Secondary | ICD-10-CM

## 2017-12-21 LAB — BASIC METABOLIC PANEL
Anion gap: 13 (ref 5–15)
BUN: 21 mg/dL (ref 8–23)
CO2: 23 mmol/L (ref 22–32)
Calcium: 8.8 mg/dL — ABNORMAL LOW (ref 8.9–10.3)
Chloride: 102 mmol/L (ref 98–111)
Creatinine, Ser: 1.13 mg/dL (ref 0.61–1.24)
GFR calc Af Amer: >60 mL/min (ref >60)
GFR calc non Af Amer: >60 mL/min (ref >60)
GLUCOSE: 153 mg/dL — AB (ref 70–99)
Potassium: 4.2 mmol/L (ref 3.5–5.1)
Sodium: 138 mmol/L (ref 135–145)

## 2017-12-21 LAB — HIV ANTIBODY (ROUTINE TESTING W REFLEX): HIV Screen 4th Generation wRfx: NONREACTIVE

## 2017-12-21 LAB — GLUCOSE, CAPILLARY
Glucose-Capillary: 137 mg/dL — ABNORMAL HIGH (ref 70–99)
Glucose-Capillary: 251 mg/dL — ABNORMAL HIGH (ref 70–99)

## 2017-12-21 LAB — CBC
HCT: 40 % (ref 39.0–52.0)
Hemoglobin: 12.3 g/dL — ABNORMAL LOW (ref 13.0–17.0)
MCH: 26.2 pg (ref 26.0–34.0)
MCHC: 30.8 g/dL (ref 30.0–36.0)
MCV: 85.1 fL (ref 80.0–100.0)
Platelets: 334 10*3/uL (ref 150–400)
RBC: 4.7 MIL/uL (ref 4.22–5.81)
RDW: 16.1 % — ABNORMAL HIGH (ref 11.5–15.5)
WBC: 12.4 10*3/uL — ABNORMAL HIGH (ref 4.0–10.5)
nRBC: 0 % (ref 0.0–0.2)

## 2017-12-21 MED ORDER — MUPIROCIN 2 % EX OINT: 1.0000 "application " | TOPICAL_OINTMENT | Freq: Every day | CUTANEOUS | Status: DC | PRN

## 2017-12-21 MED ORDER — CARVEDILOL 25 MG PO TABS
25.0000 mg | ORAL_TABLET | Freq: Two times a day (BID) | ORAL | Status: DC
  Administered 2017-12-21 – 2017-12-26 (×11): 25 mg via ORAL
  Filled 2017-12-21 (×11): qty 1

## 2017-12-21 MED ORDER — VANCOMYCIN HCL IN DEXTROSE 1-5 GM/200ML-% IV SOLN
1000.0000 mg | Freq: Two times a day (BID) | INTRAVENOUS | Status: DC
  Filled 2017-12-21 (×2): qty 200

## 2017-12-21 MED ORDER — INSULIN GLARGINE 100 UNIT/ML ~~LOC~~ SOLN
30.0000 [IU] | Freq: Every day | SUBCUTANEOUS | Status: DC
  Administered 2017-12-21 – 2017-12-23 (×3): 30 [IU] via SUBCUTANEOUS
  Administered 2017-12-24: 12 [IU] via SUBCUTANEOUS
  Administered 2017-12-25: 30 [IU] via SUBCUTANEOUS
  Filled 2017-12-21 (×5): qty 0.3

## 2017-12-21 MED ORDER — MUPIROCIN 2 % EX OINT
1.0000 "application " | TOPICAL_OINTMENT | Freq: Every day | CUTANEOUS | Status: DC
  Administered 2017-12-21 – 2017-12-26 (×4): 1 via TOPICAL
  Filled 2017-12-21 (×2): qty 22

## 2017-12-21 MED ORDER — GADOBUTROL 1 MMOL/ML IV SOLN: 10.0000 mL | Freq: Once | INTRAVENOUS | Status: DC | PRN

## 2017-12-21 MED ORDER — VANCOMYCIN HCL IN DEXTROSE 1-5 GM/200ML-% IV SOLN
1000.0000 mg | Freq: Two times a day (BID) | INTRAVENOUS | Status: AC
  Administered 2017-12-22 – 2017-12-25 (×8): 1000 mg via INTRAVENOUS
  Filled 2017-12-21 (×8): qty 200

## 2017-12-21 MED ORDER — PIPERACILLIN-TAZOBACTAM 3.375 G IVPB
3.3750 g | Freq: Three times a day (TID) | INTRAVENOUS | Status: AC
  Administered 2017-12-21 – 2017-12-25 (×13): 3.375 g via INTRAVENOUS
  Filled 2017-12-21 (×14): qty 50

## 2017-12-21 MED ORDER — GADOBUTROL 1 MMOL/ML IV SOLN
10.0000 mL | Freq: Once | INTRAVENOUS | Status: AC | PRN
  Administered 2017-12-21: 10 mL via INTRAVENOUS

## 2017-12-21 NOTE — Progress Notes (Signed)
Pt left unit en route to MRI. IV ABX stopped per radiology request for IV access. Explained to pt and family.

## 2017-12-21 NOTE — Consult Note (Signed)
Reason for Consult:infected left BKA Referring Physician: hospitalist service  [name] is an 67 y.o. male.  HPI: 67 yo male with hx of IDDM associated with PVD and neuropathy admitted with an infected left BKA stump. He first noted drainage 2 days ago without injury. Three years S/P left BKA wearing a prosthesis.ALso has had recent Rx for blisters right foot which are stable.  Past Medical History:  Diagnosis Date  . Coronary atherosclerosis of unspecified type of vessel, native or graft   . Diabetes mellitus without complication ([place])   . Hemorrhage of rectum and anus   . History of kidney stones   . Impotence of organic origin   . Internal hemorrhoids without mention of complication   . Malignant neoplasm of prostate ([place])   . Mononeuritis of unspecified site   . Osteoarthrosis, unspecified whether generalized or localized, unspecified site   . Other and unspecified hyperlipidemia   . Personal history of colonic polyps   . Personal history of diabetic foot ulcer    saw wound center, resolved 05/08/2010  . Personal history of gallstones   . Routine general medical examination at a health care facility   . Type II or unspecified type diabetes mellitus with neurological manifestations, not stated as uncontrolled(250.60)   . Unspecified glaucoma(365.9)   . Unspecified sleep apnea    cpcp  . Unspecified venous (peripheral) insufficiency     Past Surgical History:  Procedure Laterality Date  . AMPUTATION Left 12/30/2012   Procedure: AMPUTATION RAY ;  Surgeon: Meredith Pel, MD;  Location: [facility];  Service: Orthopedics;  Laterality: Left;  LEFT GREAT TOE RAY AMPUTATION  . AMPUTATION Left 02/23/2016   Procedure: Left Below Knee Amputation;  Surgeon: [name], MD;  Location: [facility];  Service: Orthopedics;  Laterality: Left;  . APPENDECTOMY    . BASAL CELL CARCINOMA EXCISION  2/16   left forearm  . CARDIAC CATHETERIZATION  1998   Negative  . CATARACT EXTRACTION Right 2017    then lid surgery  . CORONARY ARTERY BYPASS GRAFT  09/2005   Post op AFIB  . EYE SURGERY    . FOOT BONE EXCISION Left 11/03/2013   DR DUDA   . I&D EXTREMITY Left 11/03/2013   Procedure: Left Foot Partial Bone Excision Cuboid and Medial Cuneiform, Wound Closures;  Surgeon: [name], MD;  Location: [facility];  Service: Orthopedics;  Laterality: Left;  . INSERTION PROSTATE RADIATION SEED  2009   RT and seeds for prostate cancer  . KIDNEY STONE SURGERY  04/1993  . RCA stents  04/2003   EF 55%  . RETINAL DETACHMENT SURGERY  2002-2003  . thrombosed vein  1993   Right leg    Family History  Problem Relation Age of Onset  . Lung cancer Father   . Multiple sclerosis Mother   . Heart attack Other        paternal aunts and uncles  . Peripheral vascular disease Maternal Grandfather        several amputations    Social History:  reports that he quit smoking about 16 years ago. His smoking use included cigars and cigarettes. He has a 70.00 pack-year smoking history. He has never used smokeless tobacco. He reports current alcohol use. He reports that he does not use drugs.  Allergies:  Allergies  Allergen Reactions  . Atorvastatin Other (See Comments)    REACTION: myalgias  . Ezetimibe Other (See Comments)    Body ache  .  Simvastatin Other (See Comments)    Body ache      Results for orders placed or performed during the hospital encounter of 12/20/17 (from the past 48 hour(s))  CBC with Differential     Status: Abnormal   Collection Time: 12/20/17 10:14 AM  Result Value Ref Range   WBC 15.2 (H) 4.0 - 10.5 K/uL   RBC 5.08 4.22 - 5.81 MIL/uL   Hemoglobin 13.3 13.0 - 17.0 g/dL   HCT 43.9 39.0 - 52.0 %   MCV 86.4 80.0 - 100.0 fL   MCH 26.2 26.0 - 34.0 pg   MCHC 30.3 30.0 - 36.0 g/dL   RDW 16.1 (H) 11.5 - 15.5 %   Platelets 366 150 - 400 K/uL   nRBC 0.0 0.0 - 0.2 %   Neutrophils Relative % 78 %   Neutro Abs 12.0 (H) 1.7 - 7.7 K/uL   Lymphocytes Relative 7 %   Lymphs Abs 1.0  0.7 - 4.0 K/uL   Monocytes Relative 6 %   Monocytes Absolute 0.9 0.1 - 1.0 K/uL   Eosinophils Relative 3 %   Eosinophils Absolute 0.4 0.0 - 0.5 K/uL   Basophils Relative 1 %   Basophils Absolute 0.1 0.0 - 0.1 K/uL   Immature Granulocytes 5 %   Abs Immature Granulocytes 0.78 (H) 0.00 - 0.07 K/uL    Comment: Performed at [facility], [address] [place], [place] [address]  Basic metabolic panel     Status: Abnormal   Collection Time: 12/20/17 10:14 AM  Result Value Ref Range   Sodium 138 135 - 145 mmol/L   Potassium 4.7 3.5 - 5.1 mmol/L   Chloride 101 98 - 111 mmol/L   CO2 25 22 - 32 mmol/L   Glucose, Bld 182 (H) 70 - 99 mg/dL   BUN 25 (H) 8 - 23 mg/dL   Creatinine, Ser 1.22 0.61 - 1.24 mg/dL   Calcium 9.2 8.9 - 10.3 mg/dL   GFR calc non Af Amer >60 >60 mL/min   GFR calc Af Amer >60 >60 mL/min   Anion gap 12 5 - 15    Comment: Performed at [facility], [place] [address], [place], [place] [address]  I-Stat CG4 Lactic Acid, ED     Status: None   Collection Time: 12/20/17 10:23 AM  Result Value Ref Range   Lactic Acid, Venous 1.36 0.5 - 1.9 mmol/L  CBG monitoring, ED     Status: Abnormal   Collection Time: 12/20/17  3:54 PM  Result Value Ref Range   Glucose-Capillary 149 (H) 70 - 99 mg/dL  HIV antibody (Routine Testing)     Status: None   Collection Time: 12/20/17  6:11 PM  Result Value Ref Range   HIV Screen 4th Generation wRfx Non Reactive Non Reactive    Comment: (NOTE) Performed At: [facility] [place], [place] [id number] Rush Farmer MD QQ:[id number]   CBC     Status: Abnormal   Collection Time: 12/20/17  6:11 PM  Result Value Ref Range   WBC 14.3 (H) 4.0 - 10.5 K/uL   RBC 4.96 4.22 - 5.81 MIL/uL   Hemoglobin 13.2 13.0 - 17.0 g/dL   HCT 41.9 39.0 - 52.0 %   MCV 84.5 80.0 - 100.0 fL   MCH 26.6 26.0 - 34.0 pg   MCHC 31.5 30.0 - 36.0 g/dL   RDW 16.1 (H) 11.5 - 15.5 %   Platelets 347 150 - 400 K/uL  nRBC 0.0 0.0 - 0.2 %     Comment: Performed at [facility], [place] [address], [place], [place] [address]  Creatinine, serum     Status: None   Collection Time: 12/20/17  6:11 PM  Result Value Ref Range   Creatinine, Ser 1.16 0.61 - 1.24 mg/dL   GFR calc non Af Amer >60 >60 mL/min   GFR calc Af Amer >60 >60 mL/min    Comment: Performed at [facility] [address], [place], [place] [address]  Glucose, capillary     Status: Abnormal   Collection Time: 12/20/17  9:28 PM  Result Value Ref Range   Glucose-Capillary 143 (H) 70 - 99 mg/dL   Comment 1 Document in Chart   Basic metabolic panel     Status: Abnormal   Collection Time: 12/21/17  4:17 AM  Result Value Ref Range   Sodium 138 135 - 145 mmol/L   Potassium 4.2 3.5 - 5.1 mmol/L   Chloride 102 98 - 111 mmol/L   CO2 23 22 - 32 mmol/L   Glucose, Bld 153 (H) 70 - 99 mg/dL   BUN 21 8 - 23 mg/dL   Creatinine, Ser 1.13 0.61 - 1.24 mg/dL   Calcium 8.8 (L) 8.9 - 10.3 mg/dL   GFR calc non Af Amer >60 >60 mL/min   GFR calc Af Amer >60 >60 mL/min   Anion gap 13 5 - 15    Comment: Performed at [facility], [place] [address] [place], [place] [address]  CBC     Status: Abnormal   Collection Time: 12/21/17  4:17 AM  Result Value Ref Range   WBC 12.4 (H) 4.0 - 10.5 K/uL   RBC 4.70 4.22 - 5.81 MIL/uL   Hemoglobin 12.3 (L) 13.0 - 17.0 g/dL   HCT 40.0 39.0 - 52.0 %   MCV 85.1 80.0 - 100.0 fL   MCH 26.2 26.0 - 34.0 pg   MCHC 30.8 30.0 - 36.0 g/dL   RDW 16.1 (H) 11.5 - 15.5 %   Platelets 334 150 - 400 K/uL   nRBC 0.0 0.0 - 0.2 %    Comment: Performed at [facility], [place] [address], [place], [place] [address]  Glucose, capillary     Status: Abnormal   Collection Time: 12/21/17  6:47 AM  Result Value Ref Range   Glucose-Capillary 137 (H) 70 - 99 mg/dL   Comment 1 Document in Chart     Dg Knee Complete 4 Views Left  Result Date: 12/20/2017 CLINICAL DATA:  LEFT lower extremity infection, purulent range and fever overnight, increased  erythema, prior BKA EXAM: LEFT KNEE - COMPLETE 4+ VIEW COMPARISON:  None FINDINGS: Post BKA. Osseous demineralization. Joint space narrowing LEFT knee. No acute fracture, dislocation, or bone destruction. Soft tissue swelling at stump. No definite soft tissue gas. Scattered atherosclerotic calcifications. IMPRESSION: Osseous mineralization LEFT knee with degenerative changes and prior LEFT BKA. Soft tissue swelling at stump without acute osseous abnormalities. Electronically Signed   By: Lavonia Dana M.D.   On: 12/20/2017 11:50   Dg Foot Complete Right  Result Date: 12/20/2017 CLINICAL DATA:  Wound on lateral side of RIGHT foot, diabetes mellitus EXAM: RIGHT FOOT COMPLETE - 3+ VIEW COMPARISON:  None FINDINGS: Osseous demineralization. Joint spaces preserved. Soft tissue wound with small amount of gas identified at the lateral margin of the fifth MTP joint. No fracture, dislocation, or bone destruction. Plantar and Achilles insertion calcaneal spur formation. IMPRESSION:  No acute osseous abnormalities. Electronically Signed   By: Lavonia Dana M.D.   On: 12/20/2017 11:52    ROS Blood pressure (!) 142/66, pulse 91, temperature 99.1 F (37.3 C), temperature source Oral, resp. rate 12, height 6\' 2"  (1.88 m), weight 128.1 kg, SpO2 97 %. Physical Examawake, alert and oriented X 3. Comfortable sitting in chair, afebrile. Dressing removed from left BKA with reddened stump and gross purulence from 4 mm sinus medially along the incision. Pus cultured. Dressing reapplied. No ascending lympangitis or painful knee or thigh. Right foot with non infected ulcers dorso laterally at base of 4th and 5th toes  Assessment/Plan:redress left BKA and right foot: Await MRI right foot and left BKA. Presently stable. Will need further intervention of left BKA with revision based on MRI. MRI   Garald Balding 12/21/2017, 9:01 AM

## 2017-12-21 NOTE — Progress Notes (Signed)
Patient placed self on CPAP machine.  RT assistance not needed at this time.

## 2017-12-21 NOTE — Progress Notes (Signed)
Pharmacy Antibiotic Note  [name] is a 67 y.o. male admitted on 12/20/2017 with cellulitis.  Pharmacy has been consulted for vancomycin and Zosyn dosing. Patient has a hx of chronic stump infection and has been recently treated with doxycycline outpatient. WBC 12.4. CrCl ~88 ml/min. No vancomycin loading dose given in ED.   Plan: Vancomycin 1000 mg IV Q 12 hrs. Goal AUC 400-550. Expected AUC: 514.8 SCr used: 1.13 Zosyn 3.375g IV q8h (4 hour infusion).  Monitor renal fxn, cultures, clinical status, levels prn  Height: 6\' 2"  (188 cm) Weight: 282 lb 6.6 oz (128.1 kg) IBW/kg (Calculated) : 82.2  Temp (24hrs), Avg:98.4 F (36.9 C), Min:97.8 F (36.6 C), Max:99.1 F (37.3 C)  Recent Labs  Lab 12/20/17 1014 12/20/17 1023 12/20/17 1811 12/21/17 0417  WBC 15.2*  --  14.3* 12.4*  CREATININE 1.22  --  1.16 1.13  LATICACIDVEN  --  1.36  --   --     Estimated Creatinine Clearance: 87.8 mL/min (by C-G formula based on SCr of 1.13 mg/dL).    Allergies  Allergen Reactions  . Atorvastatin Other (See Comments)    REACTION: myalgias  . Ezetimibe Other (See Comments)    Body ache  . Simvastatin Other (See Comments)    Body ache    Antimicrobials this admission: Zosyn 12/14 >>  vanc 12/14 >>   Dose adjustments this admission:   Microbiology results: 12/14 BCx:   Thank you for allowing pharmacy to be a part of this patient's care.  Harrietta Guardian, PharmD PGY1 Pharmacy Resident 12/21/2017    11:56 AM Please check AMION for all [facility] numbers

## 2017-12-21 NOTE — Progress Notes (Signed)
PROGRESS NOTE  [name] MWN:[id number] DOB: 14-Dec-1950 DOA: 12/20/2017 PCP: [name], MD   LOS: 1 day   Brief Narrative / Interim history: Pleasant 67 year old male with history of diabetes, hypertension, hyperlipidemia, diabetic foot ulcer status post left below the knee amputation few years ago with chronic stump infection on antibiotics for the last several weeks and followed closely as an outpatient presents to the hospital on 12/14 with pain and redness in the left foot stump, as well as subjective fever and chills and generalized sweats.  Subjective: -Feels better this morning, denies any fever or chills.  No chest pain, no shortness of breath.  Assessment & Plan: Principal Problem:   Cellulitis of extremity Active Problems:   Hyperlipemia   Coronary atherosclerosis of native coronary artery   Venous (peripheral) insufficiency   Osteoarthritis, multiple sites   Obstructive sleep apnea   Type 2 diabetes mellitus with diabetic polyneuropathy, with long-term current use of insulin (HCC)   Left below-knee amputee (HCC)   PAD (peripheral artery disease) (HCC)   Chronic renal disease, stage III (HCC)   Cellulitis and abscess of left lower extremity   Principal Problem Cellulitis/abscess of the left stump -Probably needs to have that revised, I have consulted orthopedic surgery, appreciate input -We will obtain an MRI of the area to better characterize -For now keep on broad-spectrum antibiotics with vancomycin and Zosyn.  Wound cultured  Additional Problems Type 2 diabetes, insulin-dependent, well controlled, with vascular complications -Most recent hemoglobin A1c is 6.4.  He is doing well with his diabetes, will resume home Lantus at a lower dose given the fact that he is hospitalized and placed on sliding scale insulin  Right foot chronic wound -With overlying black eschar, concern for underlying osteomyelitis, will obtain an MRI of the right foot as  well  Hypertension -Continue Coreg  Peripheral arterial disease -Continue aspirin   Scheduled Meds: . aspirin  325 mg Oral Daily  . carvedilol  25 mg Oral BID WC  . enoxaparin (LOVENOX) injection  40 mg Subcutaneous Q24H  . insulin aspart  0-15 Units Subcutaneous TID WC  . insulin glargine  30 Units Subcutaneous Daily  . mupirocin ointment  1 application Topical Daily  . sodium chloride flush  3 mL Intravenous Q12H   Continuous Infusions: . sodium chloride     PRN Meds:.sodium chloride, HYDROcodone-acetaminophen, sodium chloride flush  DVT prophylaxis: Lovenox Code Status: Full code Family Communication: Daughter present at bedside Disposition Plan: To be determined  Consultants:   Orthopedic surgery  Procedures:   None   Antimicrobials:  Vancomycin 12/15 >>  Zosyn 12/15 >>   Objective: Vitals:   12/20/17 1545 12/20/17 1640 12/20/17 1922 12/21/17 0520  BP: 128/67 123/67 140/69 (!) 142/66  Pulse: 72 84 99 91  Resp: 16 18  12   Temp:  98.2 F (36.8 C) 97.8 F (36.6 C) 99.1 F (37.3 C)  TempSrc:  Oral Oral Oral  SpO2: 99% 99% 99% 97%  Weight:  128.1 kg    Height:  6\' 2"  (1.88 m)      Intake/Output Summary (Last 24 hours) at 12/21/2017 1101 Last data filed at 12/20/2017 1302 Gross per 24 hour  Intake 250 ml  Output 100 ml  Net 150 ml   Filed Weights   12/20/17 1640  Weight: 128.1 kg    Examination:  Constitutional: NAD Eyes: PERRL, lids and conjunctivae normal ENMT: Mucous membranes are moist.  Neck: normal, supple Respiratory: clear to auscultation bilaterally, no wheezing,  no crackles. Normal respiratory effort.  Cardiovascular: Regular rate and rhythm, no murmurs / rubs / gallops. No LE edema.  Abdomen: no tenderness. Bowel sounds positive.  Musculoskeletal: no clubbing / cyanosis. Skin: Cellulitis overlying the left stump with 1 open wound with good granulation tissue, also an area of drainage and fluctuance, right foot with black eschar  on the lateral aspect Neurologic: Nonfocal Psychiatric: Normal judgment and insight. Alert and oriented x 3. Normal mood.    Data Reviewed: I have independently reviewed following labs and imaging studies   CBC: Recent Labs  Lab 12/20/17 1014 12/20/17 1811 12/21/17 0417  WBC 15.2* 14.3* 12.4*  NEUTROABS 12.0*  --   --   HGB 13.3 13.2 12.3*  HCT 43.9 41.9 40.0  MCV 86.4 84.5 85.1  PLT 366 347 505   Basic Metabolic Panel: Recent Labs  Lab 12/20/17 1014 12/20/17 1811 12/21/17 0417  NA 138  --  138  K 4.7  --  4.2  CL 101  --  102  CO2 25  --  23  GLUCOSE 182*  --  153*  BUN 25*  --  21  CREATININE 1.22 1.16 1.13  CALCIUM 9.2  --  8.8*   GFR: Estimated Creatinine Clearance: 87.8 mL/min (by C-G formula based on SCr of 1.13 mg/dL). Liver Function Tests: No results for input(s): AST, ALT, ALKPHOS, BILITOT, PROT, ALBUMIN in the last 168 hours. No results for input(s): LIPASE, AMYLASE in the last 168 hours. No results for input(s): AMMONIA in the last 168 hours. Coagulation Profile: No results for input(s): INR, PROTIME in the last 168 hours. Cardiac Enzymes: No results for input(s): CKTOTAL, CKMB, CKMBINDEX, TROPONINI in the last 168 hours. BNP (last 3 results) No results for input(s): PROBNP in the last 8760 hours. HbA1C: No results for input(s): HGBA1C in the last 72 hours. CBG: Recent Labs  Lab 12/20/17 1554 12/20/17 2128 12/21/17 0647  GLUCAP 149* 143* 137*   Lipid Profile: No results for input(s): CHOL, HDL, LDLCALC, TRIG, CHOLHDL, LDLDIRECT in the last 72 hours. Thyroid Function Tests: No results for input(s): TSH, T4TOTAL, FREET4, T3FREE, THYROIDAB in the last 72 hours. Anemia Panel: No results for input(s): VITAMINB12, FOLATE, FERRITIN, TIBC, IRON, RETICCTPCT in the last 72 hours. Urine analysis:    Component Value Date/Time   COLORURINE YELLOW 12/14/2017 0020   APPEARANCEUR CLEAR 12/14/2017 0020   LABSPEC 1.021 12/14/2017 0020   PHURINE 5.0  12/14/2017 0020   GLUCOSEU NEGATIVE 12/14/2017 0020   HGBUR NEGATIVE 12/14/2017 0020   BILIRUBINUR NEGATIVE 12/14/2017 0020   BILIRUBINUR neg 04/21/2012 0904   KETONESUR 5 (A) 12/14/2017 0020   PROTEINUR NEGATIVE 12/14/2017 0020   UROBILINOGEN negative 04/21/2012 0904   NITRITE NEGATIVE 12/14/2017 0020   LEUKOCYTESUR NEGATIVE 12/14/2017 0020   Sepsis Labs: Invalid input(s): PROCALCITONIN, LACTICIDVEN  No results found for this or any previous visit (from the past 240 hour(s)).    Radiology Studies: Dg Knee Complete 4 Views Left  Result Date: 12/20/2017 CLINICAL DATA:  LEFT lower extremity infection, purulent range and fever overnight, increased erythema, prior BKA EXAM: LEFT KNEE - COMPLETE 4+ VIEW COMPARISON:  None FINDINGS: Post BKA. Osseous demineralization. Joint space narrowing LEFT knee. No acute fracture, dislocation, or bone destruction. Soft tissue swelling at stump. No definite soft tissue gas. Scattered atherosclerotic calcifications. IMPRESSION: Osseous mineralization LEFT knee with degenerative changes and prior LEFT BKA. Soft tissue swelling at stump without acute osseous abnormalities. Electronically Signed   By: Crist Infante.D.  On: 12/20/2017 11:50   Dg Foot Complete Right  Result Date: 12/20/2017 CLINICAL DATA:  Wound on lateral side of RIGHT foot, diabetes mellitus EXAM: RIGHT FOOT COMPLETE - 3+ VIEW COMPARISON:  None FINDINGS: Osseous demineralization. Joint spaces preserved. Soft tissue wound with small amount of gas identified at the lateral margin of the fifth MTP joint. No fracture, dislocation, or bone destruction. Plantar and Achilles insertion calcaneal spur formation. IMPRESSION: No acute osseous abnormalities. Electronically Signed   By: Lavonia Dana M.D.   On: 12/20/2017 11:52    Marzetta Board, MD, PhD [facility] Pager [phone]  If 7PM-7AM, please contact night-coverage [email or web address] Password [email or web address] 12/21/2017, 11:01 AM

## 2017-12-22 ENCOUNTER — Ambulatory Visit (INDEPENDENT_AMBULATORY_CARE_PROVIDER_SITE_OTHER): Payer: Medicare Other | Admitting: Physician Assistant

## 2017-12-22 ENCOUNTER — Ambulatory Visit (INDEPENDENT_AMBULATORY_CARE_PROVIDER_SITE_OTHER): Payer: Self-pay | Admitting: Physician Assistant

## 2017-12-22 DIAGNOSIS — Z794 Long term (current) use of insulin: Secondary | ICD-10-CM

## 2017-12-22 DIAGNOSIS — M86271 Subacute osteomyelitis, right ankle and foot: Secondary | ICD-10-CM

## 2017-12-22 DIAGNOSIS — L03116 Cellulitis of left lower limb: Secondary | ICD-10-CM

## 2017-12-22 DIAGNOSIS — L02416 Cutaneous abscess of left lower limb: Secondary | ICD-10-CM

## 2017-12-22 DIAGNOSIS — E1142 Type 2 diabetes mellitus with diabetic polyneuropathy: Secondary | ICD-10-CM

## 2017-12-22 DIAGNOSIS — Z89512 Acquired absence of left leg below knee: Secondary | ICD-10-CM

## 2017-12-22 LAB — BASIC METABOLIC PANEL
Anion gap: 13 (ref 5–15)
BUN: 19 mg/dL (ref 8–23)
CO2: 23 mmol/L (ref 22–32)
Calcium: 8.8 mg/dL — ABNORMAL LOW (ref 8.9–10.3)
Chloride: 101 mmol/L (ref 98–111)
Creatinine, Ser: 1.12 mg/dL (ref 0.61–1.24)
GFR calc Af Amer: >60 mL/min (ref >60)
GFR calc non Af Amer: >60 mL/min (ref >60)
Glucose, Bld: 196 mg/dL — ABNORMAL HIGH (ref 70–99)
Potassium: 4.1 mmol/L (ref 3.5–5.1)
Sodium: 137 mmol/L (ref 135–145)

## 2017-12-22 LAB — CBC
HCT: 38.9 % — ABNORMAL LOW (ref 39.0–52.0)
Hemoglobin: 12.4 g/dL — ABNORMAL LOW (ref 13.0–17.0)
MCH: 27 pg (ref 26.0–34.0)
MCHC: 31.9 g/dL (ref 30.0–36.0)
MCV: 84.6 fL (ref 80.0–100.0)
Platelets: 297 10*3/uL (ref 150–400)
RBC: 4.6 MIL/uL (ref 4.22–5.81)
RDW: 16.2 % — ABNORMAL HIGH (ref 11.5–15.5)
WBC: 12.4 10*3/uL — ABNORMAL HIGH (ref 4.0–10.5)
nRBC: 0 % (ref 0.0–0.2)

## 2017-12-22 LAB — GLUCOSE, CAPILLARY
Glucose-Capillary: 205 mg/dL — ABNORMAL HIGH (ref 70–99)
Glucose-Capillary: 154 mg/dL — ABNORMAL HIGH (ref 70–99)
Glucose-Capillary: 172 mg/dL — ABNORMAL HIGH (ref 70–99)
Glucose-Capillary: 200 mg/dL — ABNORMAL HIGH (ref 70–99)
Glucose-Capillary: 166 mg/dL — ABNORMAL HIGH (ref 70–99)
Glucose-Capillary: 142 mg/dL — ABNORMAL HIGH (ref 70–99)

## 2017-12-22 NOTE — Consult Note (Signed)
[facility] Nurse wound consult note Patient receiving care in [facility] [id number].  No family present. Reason for Consult: Right foot and left stump wound care Dr. [name] is planning to take this patient to surgery tomorrow.  No need for WOC wound care orders at this time.  Please follow orders placed by Dr. [name] post surgery.  I changed the dressing on the left stump today; two ABD pads were secured to the area with Kerlex. Monitor the wound area(s) for worsening of condition such as: Signs/symptoms of infection,  Increase in size,  Development of or worsening of odor, Development of pain, or increased pain at the affected locations.  Notify the medical team if any of these develop.  Thank you for the consult.  Discussed plan of care with the patient and bedside nurse.  [facility] nurse will not follow at this time.  Please re-consult the [facility] team if needed.  Val Riles, RN, MSN, CWOCN, CNS-BC, pager [phone]

## 2017-12-22 NOTE — H&P (View-Only) (Signed)
ORTHOPAEDIC CONSULTATION  REQUESTING PHYSICIAN: Caren Griffins, MD  Chief Complaint: Purulent abscess left transtibial amputation with necrotic ulcer fifth metatarsal head right foot.  HPI: [name] is a 67 y.o. male who presents with large purulent draining abscess left transtibial amputation and a necrotic ulcer right foot fifth metatarsal head.  Patient states that the drainage and wound started after he fell on his residual limb.  Past Medical History:  Diagnosis Date  . Coronary atherosclerosis of unspecified type of vessel, native or graft   . Diabetes mellitus without complication ([place])   . Hemorrhage of rectum and anus   . History of kidney stones   . Impotence of organic origin   . Internal hemorrhoids without mention of complication   . Malignant neoplasm of prostate ([place])   . Mononeuritis of unspecified site   . Osteoarthrosis, unspecified whether generalized or localized, unspecified site   . Other and unspecified hyperlipidemia   . Personal history of colonic polyps   . Personal history of diabetic foot ulcer    saw wound center, resolved 05/08/2010  . Personal history of gallstones   . Routine general medical examination at a health care facility   . Type II or unspecified type diabetes mellitus with neurological manifestations, not stated as uncontrolled(250.60)   . Unspecified glaucoma(365.9)   . Unspecified sleep apnea    cpcp  . Unspecified venous (peripheral) insufficiency    Past Surgical History:  Procedure Laterality Date  . AMPUTATION Left 12/30/2012   Procedure: AMPUTATION RAY ;  Surgeon: Meredith Pel, MD;  Location: [facility];  Service: Orthopedics;  Laterality: Left;  LEFT GREAT TOE RAY AMPUTATION  . AMPUTATION Left 02/23/2016   Procedure: Left Below Knee Amputation;  Surgeon: [name], MD;  Location: [facility];  Service: Orthopedics;  Laterality: Left;  . APPENDECTOMY    . BASAL CELL CARCINOMA EXCISION  2/16   left forearm  . CARDIAC  CATHETERIZATION  1998   Negative  . CATARACT EXTRACTION Right 2017   then lid surgery  . CORONARY ARTERY BYPASS GRAFT  09/2005   Post op AFIB  . EYE SURGERY    . FOOT BONE EXCISION Left 11/03/2013   DR DUDA   . I&D EXTREMITY Left 11/03/2013   Procedure: Left Foot Partial Bone Excision Cuboid and Medial Cuneiform, Wound Closures;  Surgeon: [name], MD;  Location: [facility];  Service: Orthopedics;  Laterality: Left;  . INSERTION PROSTATE RADIATION SEED  2009   RT and seeds for prostate cancer  . KIDNEY STONE SURGERY  04/1993  . RCA stents  04/2003   EF 55%  . RETINAL DETACHMENT SURGERY  2002-2003  . thrombosed vein  1993   Right leg   Social History   Socioeconomic History  . Marital status: Widowed    Spouse name: Not on file  . Number of children: 3  . Years of education: Not on file  . Highest education level: Not on file  Occupational History  . Occupation: Heavy equipment mechanic--retired  Social Needs  . Financial resource strain: Not on file  . Food insecurity:    Worry: Not on file    Inability: Not on file  . Transportation needs:    Medical: Not on file    Non-medical: Not on file  Tobacco Use  . Smoking status: Former Smoker    Packs/day: 2.00    Years: 35.00    Pack years: 70.00    Types: Cigars, Cigarettes  Last attempt to quit: 01/07/2001    Years since quitting: 16.9  . Smokeless tobacco: Never Used  Substance and Sexual Activity  . Alcohol use: Yes    Comment: rare  . Drug use: No  . Sexual activity: Yes    Partners: Female  Lifestyle  . Physical activity:    Days per week: Not on file    Minutes per session: Not on file  . Stress: Not on file  Relationships  . Social connections:    Talks on phone: Not on file    Gets together: Not on file    Attends religious service: Not on file    Active member of club or organization: Not on file    Attends meetings of clubs or organizations: Not on file    Relationship status: Not on file  Other  Topics Concern  . Not on file  Social History Narrative   Has living will   Daughter [name] is [facility] POA    Would accept resuscitation attempts--but no prolonged artificial ventilation   No tube feeds if cognitively unaware   Family History  Problem Relation Age of Onset  . Lung cancer Father   . Multiple sclerosis Mother   . Heart attack Other        paternal aunts and uncles  . Peripheral vascular disease Maternal Grandfather        several amputations   - negative except otherwise stated in the family history section Allergies  Allergen Reactions  . Atorvastatin Other (See Comments)    REACTION: myalgias  . Ezetimibe Other (See Comments)    Body ache  . Simvastatin Other (See Comments)    Body ache   Prior to Admission medications   Medication Sig Start Date End Date Taking? Authorizing Provider  Amino Acids-Protein Hydrolys (FEEDING SUPPLEMENT, PRO-STAT SUGAR FREE 64,) LIQD Take 30 mLs by mouth 3 (three) times daily with meals.   Yes [provider]  aspirin 325 MG tablet Take 325 mg by mouth daily.     Yes [provider]  carvedilol (COREG) 25 MG tablet TAKE 1 TABLET TWICE DAILY Patient taking differently: Take 25 mg by mouth 2 (two) times daily with a meal.  05/28/17  Yes [name], MD  doxycycline (VIBRAMYCIN) 100 MG capsule Take 1 capsule (100 mg total) by mouth 2 (two) times daily. Patient taking differently: Take 100 mg by mouth 2 (two) times daily. 14 day supply 03/12/98  Yes Delora Fuel, MD  HYDROcodone-acetaminophen [facility]) 5-325 MG tablet Take 1 tablet by mouth every 4 (four) hours as needed for moderate pain. 93/8/18  Yes Delora Fuel, MD  insulin aspart (NOVOLOG FLEXPEN) 100 UNIT/ML FlexPen INJECT&nbsp;&nbsp;30&nbsp;&nbsp;TO&nbsp;&nbsp;35 UNITS SUBCUTANEOUSLY THREE TIMES DAILY WITH MEALS Patient taking differently: Inject 35 Units into the skin 3 (three) times daily with meals.  11/11/17  Yes Philemon Kingdom, MD  Insulin  Glargine (TOUJEO SOLOSTAR) 300 UNIT/ML SOPN Inject 60 Units into the skin daily. 03/06/17  Yes Philemon Kingdom, MD  losartan-hydrochlorothiazide (HYZAAR) 50-12.5 MG tablet TAKE 1 TABLET EVERY DAY Patient taking differently: Take 1 tablet by mouth daily.  07/04/17  Yes [name], MD  metFORMIN (GLUCOPHAGE) 1000 MG tablet Take 1 tablet (1,000 mg total) by mouth daily. Patient taking differently: Take 1,000 mg by mouth every evening.  03/06/17  Yes Philemon Kingdom, MD  mupirocin ointment (BACTROBAN) 2 % Apply to wound daily Patient taking differently: Apply 1 application topically daily as needed (wound healing). Apply to  wound daily 05/31/16  Yes Dondra Prader R, NP  nitroGLYCERIN (NITRODUR - DOSED IN MG/24 HR) 0.2 mg/hr patch PLACE 1 PATCH (0.2 MG TOTAL) DAILY ONTO THE SKIN. Patient taking differently: Place 0.2 mg onto the skin daily. On left leg 12/08/17  Yes [name], MD  potassium citrate (UROCIT-K) 10 MEQ (1080 MG) SR tablet Take 20 mEq by mouth 2 (two) times daily.  03/30/14  Yes [provider]  Probiotic Product (PROBIOTIC DAILY PO) Take 1 capsule by mouth daily.   Yes [provider]  tamsulosin (FLOMAX) 0.4 MG CAPS capsule Take 1 capsule (0.4 mg total) by mouth daily. 97/6/73  Yes Delora Fuel, MD  triamcinolone (KENALOG) 0.025 % cream APPLY 1 APPLICATION TOPICALLY 3 (THREE) TIMES DAILY AS NEEDED. Patient taking differently: Apply 1 application topically 3 (three) times daily as needed (rash on back).  02/22/14  Yes [name], MD  vitamin C (ASCORBIC ACID) 500 MG tablet Take 500 mg by mouth 2 (two) times daily.   Yes [provider]  zinc gluconate 50 MG tablet Take 50 mg by mouth daily.   Yes [provider]  ACCU-CHEK FASTCLIX LANCETS MISC Use to check sugar 3 times daily dx- E11.42 07/03/16   Philemon Kingdom, MD  Blood Glucose Monitoring Suppl (ACCU-CHEK GUIDE) w/Device KIT 1 kit by Other route 3 (three) times daily. Use to check  blood sugars 3 times daily, Dx Code A19.37 07/09/16   Philemon Kingdom, MD  DROPLET PEN NEEDLES 31G X 5 MM MISC USE TO INJECT INSULIN FOUR TIMES DAILY AS INSTRUCTED (FOR DIABETES) 11/18/17   Philemon Kingdom, MD  glucose blood (ACCU-CHEK GUIDE) test strip Use as instructed to check sugar 3 times daily dx- E11.42 07/03/16   Philemon Kingdom, MD  ondansetron (ZOFRAN) 4 MG tablet Take 1 tablet (4 mg total) by mouth every 6 (six) hours as needed for nausea or vomiting. Patient not taking: Reported on 90/24/0973 53/2/99   Delora Fuel, MD  pentoxifylline (TRENTAL) 400 MG CR tablet Take 1 tablet (400 mg total) by mouth 3 (three) times daily with meals. Patient not taking: Reported on 12/20/2017 05/05/17   [name], MD   Mr Foot Right W Wo Contrast  Result Date: 12/21/2017 CLINICAL DATA:  Right lateral foot wound near the fifth MTP joint. Evaluate for osteomyelitis. EXAM: MRI OF THE RIGHT FOREFOOT WITHOUT AND WITH CONTRAST TECHNIQUE: Multiplanar, multisequence MR imaging of the right forefoot was performed before and after the administration of intravenous contrast. CONTRAST:  10 mL Gadavist intravenous contrast. COMPARISON:  Right foot x-rays from yesterday. FINDINGS: Despite efforts by the technologist and patient, motion artifact is present on today's exam and could not be eliminated. This reduces exam sensitivity and specificity. Bones/Joint/Cartilage Increased edema and enhancement with subtle early T1 marrow hypointensity involving the fifth metatarsal head and fifth proximal phalanx, consistent with osteomyelitis. No fracture or dislocation. Normal alignment. No joint effusion. Mild osteoarthritis of the first MTP joint and fourth tarsometatarsal joint. Ligaments Collateral ligaments are intact. Muscles and Tendons Flexor, peroneal and extensor compartment tendons are intact. Increased T2 signal and atrophy within the intrinsic muscles of the forefoot, nonspecific, but likely related to diabetic  muscle changes. Soft tissue Small ulceration on the lateral forefoot adjacent to the fifth MTP joint extending close to the fifth metatarsal head. Mild dorsal forefoot soft tissue swelling. No fluid collection or hematoma. No soft tissue mass. IMPRESSION: 1. Small ulceration on the lateral forefoot adjacent to the fifth MTP joint with  underlying osteomyelitis of the fifth metatarsal head and fifth proximal phalanx. No abscess. Electronically Signed   By: Titus Dubin M.D.   On: 12/21/2017 22:30   Mr Tibia Fibula Left W Wo Contrast  Result Date: 12/21/2017 CLINICAL DATA:  Left below-knee amputation stump infection. Evaluate for osteomyelitis. EXAM: MRI OF LOWER LEFT EXTREMITY WITHOUT AND WITH CONTRAST TECHNIQUE: Multiplanar, multisequence MR imaging of the left proximal lower leg was performed both before and after administration of intravenous contrast. CONTRAST:  10 mL Gadavist intravenous contrast. COMPARISON:  Left knee x-rays dated December 20, 2017. FINDINGS: Bones/Joint/Cartilage Prior left below-knee amputation. There is subtle irregularity of the tibial stump with trace marrow edema and enhancement. Patchy red marrow in the distal femoral metaphysis. No fracture or dislocation. Normal alignment. No knee joint effusion. Moderate medial mild lateral compartment osteoarthritis. Ligaments Complex degenerative tearing of the medial meniscus body and posterior horn, as well as the lateral meniscus posterior horn. The cruciate and collateral ligaments are grossly intact. Muscles and Tendons Diffuse atrophy and mild edema of the visualized muscles in the proximal lower leg. Soft tissue There is a 4.2 x 7.2 x 4.1 cm rim enhancing fluid collection containing air in the anterior and medial aspect of the distal stump, draining to the skin anteriorly. The fluid collection abuts the tibial osteotomy. Moderate diffuse subcutaneous edema. IMPRESSION: 1. 7.2 cm soft tissue abscess of the below-knee amputation stump  which drains to the skin anteriorly and abuts the tibial osteotomy site, with underlying bony signal changes suspicious for very early osteomyelitis. Electronically Signed   By: Titus Dubin M.D.   On: 12/21/2017 22:40   Dg Knee Complete 4 Views Left  Result Date: 12/20/2017 CLINICAL DATA:  LEFT lower extremity infection, purulent range and fever overnight, increased erythema, prior BKA EXAM: LEFT KNEE - COMPLETE 4+ VIEW COMPARISON:  None FINDINGS: Post BKA. Osseous demineralization. Joint space narrowing LEFT knee. No acute fracture, dislocation, or bone destruction. Soft tissue swelling at stump. No definite soft tissue gas. Scattered atherosclerotic calcifications. IMPRESSION: Osseous mineralization LEFT knee with degenerative changes and prior LEFT BKA. Soft tissue swelling at stump without acute osseous abnormalities. Electronically Signed   By: Lavonia Dana M.D.   On: 12/20/2017 11:50   Dg Foot Complete Right  Result Date: 12/20/2017 CLINICAL DATA:  Wound on lateral side of RIGHT foot, diabetes mellitus EXAM: RIGHT FOOT COMPLETE - 3+ VIEW COMPARISON:  None FINDINGS: Osseous demineralization. Joint spaces preserved. Soft tissue wound with small amount of gas identified at the lateral margin of the fifth MTP joint. No fracture, dislocation, or bone destruction. Plantar and Achilles insertion calcaneal spur formation. IMPRESSION: No acute osseous abnormalities. Electronically Signed   By: Lavonia Dana M.D.   On: 12/20/2017 11:52   - pertinent xrays, CT, MRI studies were reviewed and independently interpreted  Positive ROS: All other systems have been reviewed and were otherwise negative with the exception of those mentioned in the HPI and as above.  Physical Exam: General: Alert, no acute distress Psychiatric: Patient is competent for consent with normal mood and affect Lymphatic: No axillary or cervical lymphadenopathy Cardiovascular: No pedal edema Respiratory: No cyanosis, no use of  accessory musculature GI: No organomegaly, abdomen is soft and non-tender    Images:  _0 @  Labs:  Lab Results  Component Value Date   HGBA1C 6.4 (A) 12/12/2017   HGBA1C 7.3 (A) 08/05/2017   HGBA1C 6.3 03/06/2017   ESRSEDRATE 35 (H) 03/31/2013   CRP 5.3 (H) 03/31/2013  REPTSTATUS 11/03/2016 FINAL 11/01/2016   GRAMSTAIN  12/30/2012    FEW WBC PRESENT, PREDOMINANTLY PMN RARE SQUAMOUS EPITHELIAL CELLS PRESENT ABUNDANT GRAM NEGATIVE RODS MODERATE GRAM POSITIVE COCCI IN PAIRS IN CLUSTERS   CULT  11/01/2016    NO GROWTH Performed at [facility], [place] [address] [place], [place] [address]    [place] OXYTOCA 12/15/2012   LABORGA PSEUDOMONAS AERUGINOSA 12/15/2012    Lab Results  Component Value Date   ALBUMIN 3.5 12/14/2017   ALBUMIN 4.1 02/19/2017   ALBUMIN 4.4 11/01/2016    Neurologic: Patient does not have protective sensation bilateral lower extremities.   MUSCULOSKELETAL:   Skin: Examination patient has a 1 cm in diameter ulcer which probes down to bone on the left transtibial amputation.  There is a large amount of purulent drainage.  Review of the MRI scan shows inflammation of the bone consistent with osteomyelitis with a large abscess.  Examination the right foot he has a necrotic ulcer over the fifth metatarsal head.  Review of the MRI scan shows osteomyelitis of the fifth metatarsal head with a large soft tissue defect.  There is no abscess no ascending cellulitis.  Patient has good hair growth on both lower extremities.  Assessment: Assessment: Diabetic insensate neuropathy with abscess and osteomyelitis left transtibial amputation with a short residual limb with osteomyelitis and ulceration of the fifth metatarsal head right foot.  Plan: Plan: We will need to proceed with a above-the-knee amputation on the left.  Patient does not have sufficient soft tissue or bone for a revision below the knee amputation on the left.  We will need to  proceed also with a right fifth ray amputation.  Patient will need discharge to skilled nursing postoperatively.  Anticipate surgery on Tuesday or Wednesday.  Thank you for the consult and the opportunity to see Mr. [place], MD [facility] [phone] 7:48 AM

## 2017-12-22 NOTE — Consult Note (Signed)
ORTHOPAEDIC CONSULTATION  REQUESTING PHYSICIAN: Caren Griffins, MD  Chief Complaint: Purulent abscess left transtibial amputation with necrotic ulcer fifth metatarsal head right foot.  HPI: [name] is a 67 y.o. male who presents with large purulent draining abscess left transtibial amputation and a necrotic ulcer right foot fifth metatarsal head.  Patient states that the drainage and wound started after he fell on his residual limb.  Past Medical History:  Diagnosis Date  . Coronary atherosclerosis of unspecified type of vessel, native or graft   . Diabetes mellitus without complication ([place])   . Hemorrhage of rectum and anus   . History of kidney stones   . Impotence of organic origin   . Internal hemorrhoids without mention of complication   . Malignant neoplasm of prostate ([place])   . Mononeuritis of unspecified site   . Osteoarthrosis, unspecified whether generalized or localized, unspecified site   . Other and unspecified hyperlipidemia   . Personal history of colonic polyps   . Personal history of diabetic foot ulcer    saw wound center, resolved 05/08/2010  . Personal history of gallstones   . Routine general medical examination at a health care facility   . Type II or unspecified type diabetes mellitus with neurological manifestations, not stated as uncontrolled(250.60)   . Unspecified glaucoma(365.9)   . Unspecified sleep apnea    cpcp  . Unspecified venous (peripheral) insufficiency    Past Surgical History:  Procedure Laterality Date  . AMPUTATION Left 12/30/2012   Procedure: AMPUTATION RAY ;  Surgeon: Meredith Pel, MD;  Location: [facility];  Service: Orthopedics;  Laterality: Left;  LEFT GREAT TOE RAY AMPUTATION  . AMPUTATION Left 02/23/2016   Procedure: Left Below Knee Amputation;  Surgeon: [name], MD;  Location: [facility];  Service: Orthopedics;  Laterality: Left;  . APPENDECTOMY    . BASAL CELL CARCINOMA EXCISION  2/16   left forearm  . CARDIAC  CATHETERIZATION  1998   Negative  . CATARACT EXTRACTION Right 2017   then lid surgery  . CORONARY ARTERY BYPASS GRAFT  09/2005   Post op AFIB  . EYE SURGERY    . FOOT BONE EXCISION Left 11/03/2013   DR    . I&D EXTREMITY Left 11/03/2013   Procedure: Left Foot Partial Bone Excision Cuboid and Medial Cuneiform, Wound Closures;  Surgeon: [name], MD;  Location: [facility];  Service: Orthopedics;  Laterality: Left;  . INSERTION PROSTATE RADIATION SEED  2009   RT and seeds for prostate cancer  . KIDNEY STONE SURGERY  04/1993  . RCA stents  04/2003   EF 55%  . RETINAL DETACHMENT SURGERY  2002-2003  . thrombosed vein  1993   Right leg   Social History   Socioeconomic History  . Marital status: Widowed    Spouse name: Not on file  . Number of children: 3  . Years of education: Not on file  . Highest education level: Not on file  Occupational History  . Occupation: Heavy equipment mechanic--retired  Social Needs  . Financial resource strain: Not on file  . Food insecurity:    Worry: Not on file    Inability: Not on file  . Transportation needs:    Medical: Not on file    Non-medical: Not on file  Tobacco Use  . Smoking status: Former Smoker    Packs/day: 2.00    Years: 35.00    Pack years: 70.00    Types: Cigars, Cigarettes  Last attempt to quit: 01/07/2001    Years since quitting: 16.9  . Smokeless tobacco: Never Used  Substance and Sexual Activity  . Alcohol use: Yes    Comment: rare  . Drug use: No  . Sexual activity: Yes    Partners: Female  Lifestyle  . Physical activity:    Days per week: Not on file    Minutes per session: Not on file  . Stress: Not on file  Relationships  . Social connections:    Talks on phone: Not on file    Gets together: Not on file    Attends religious service: Not on file    Active member of club or organization: Not on file    Attends meetings of clubs or organizations: Not on file    Relationship status: Not on file  Other  Topics Concern  . Not on file  Social History Narrative   Has living will   Daughter [name] is [facility] POA    Would accept resuscitation attempts--but no prolonged artificial ventilation   No tube feeds if cognitively unaware   Family History  Problem Relation Age of Onset  . Lung cancer Father   . Multiple sclerosis Mother   . Heart attack Other        paternal aunts and uncles  . Peripheral vascular disease Maternal Grandfather        several amputations   - negative except otherwise stated in the family history section Allergies  Allergen Reactions  . Atorvastatin Other (See Comments)    REACTION: myalgias  . Ezetimibe Other (See Comments)    Body ache  . Simvastatin Other (See Comments)    Body ache   Prior to Admission medications   Medication Sig Start Date End Date Taking? Authorizing Provider  Amino Acids-Protein Hydrolys (FEEDING SUPPLEMENT, PRO-STAT SUGAR FREE 64,) LIQD Take 30 mLs by mouth 3 (three) times daily with meals.   Yes [provider]  aspirin 325 MG tablet Take 325 mg by mouth daily.     Yes [provider]  carvedilol (COREG) 25 MG tablet TAKE 1 TABLET TWICE DAILY Patient taking differently: Take 25 mg by mouth 2 (two) times daily with a meal.  05/28/17  Yes [name], MD  doxycycline (VIBRAMYCIN) 100 MG capsule Take 1 capsule (100 mg total) by mouth 2 (two) times daily. Patient taking differently: Take 100 mg by mouth 2 (two) times daily. 14 day supply 03/12/98  Yes Delora Fuel, MD  HYDROcodone-acetaminophen [facility]) 5-325 MG tablet Take 1 tablet by mouth every 4 (four) hours as needed for moderate pain. 93/8/18  Yes Delora Fuel, MD  insulin aspart (NOVOLOG FLEXPEN) 100 UNIT/ML FlexPen INJECT&nbsp;&nbsp;30&nbsp;&nbsp;TO&nbsp;&nbsp;35 UNITS SUBCUTANEOUSLY THREE TIMES DAILY WITH MEALS Patient taking differently: Inject 35 Units into the skin 3 (three) times daily with meals.  11/11/17  Yes Philemon Kingdom, MD  Insulin  Glargine (TOUJEO SOLOSTAR) 300 UNIT/ML SOPN Inject 60 Units into the skin daily. 03/06/17  Yes Philemon Kingdom, MD  losartan-hydrochlorothiazide (HYZAAR) 50-12.5 MG tablet TAKE 1 TABLET EVERY DAY Patient taking differently: Take 1 tablet by mouth daily.  07/04/17  Yes [name], MD  metFORMIN (GLUCOPHAGE) 1000 MG tablet Take 1 tablet (1,000 mg total) by mouth daily. Patient taking differently: Take 1,000 mg by mouth every evening.  03/06/17  Yes Philemon Kingdom, MD  mupirocin ointment (BACTROBAN) 2 % Apply to wound daily Patient taking differently: Apply 1 application topically daily as needed (wound healing). Apply to  wound daily 05/31/16  Yes Dondra Prader R, NP  nitroGLYCERIN (NITRODUR - DOSED IN MG/24 HR) 0.2 mg/hr patch PLACE 1 PATCH (0.2 MG TOTAL) DAILY ONTO THE SKIN. Patient taking differently: Place 0.2 mg onto the skin daily. On left leg 12/08/17  Yes [name], MD  potassium citrate (UROCIT-K) 10 MEQ (1080 MG) SR tablet Take 20 mEq by mouth 2 (two) times daily.  03/30/14  Yes [provider]  Probiotic Product (PROBIOTIC DAILY PO) Take 1 capsule by mouth daily.   Yes [provider]  tamsulosin (FLOMAX) 0.4 MG CAPS capsule Take 1 capsule (0.4 mg total) by mouth daily. 97/6/73  Yes Delora Fuel, MD  triamcinolone (KENALOG) 0.025 % cream APPLY 1 APPLICATION TOPICALLY 3 (THREE) TIMES DAILY AS NEEDED. Patient taking differently: Apply 1 application topically 3 (three) times daily as needed (rash on back).  02/22/14  Yes [name], MD  vitamin C (ASCORBIC ACID) 500 MG tablet Take 500 mg by mouth 2 (two) times daily.   Yes [provider]  zinc gluconate 50 MG tablet Take 50 mg by mouth daily.   Yes [provider]  ACCU-CHEK FASTCLIX LANCETS MISC Use to check sugar 3 times daily dx- E11.42 07/03/16   Philemon Kingdom, MD  Blood Glucose Monitoring Suppl (ACCU-CHEK GUIDE) w/Device KIT 1 kit by Other route 3 (three) times daily. Use to check  blood sugars 3 times daily, Dx Code A19.37 07/09/16   Philemon Kingdom, MD  DROPLET PEN NEEDLES 31G X 5 MM MISC USE TO INJECT INSULIN FOUR TIMES DAILY AS INSTRUCTED (FOR DIABETES) 11/18/17   Philemon Kingdom, MD  glucose blood (ACCU-CHEK GUIDE) test strip Use as instructed to check sugar 3 times daily dx- E11.42 07/03/16   Philemon Kingdom, MD  ondansetron (ZOFRAN) 4 MG tablet Take 1 tablet (4 mg total) by mouth every 6 (six) hours as needed for nausea or vomiting. Patient not taking: Reported on 90/24/0973 53/2/99   Delora Fuel, MD  pentoxifylline (TRENTAL) 400 MG CR tablet Take 1 tablet (400 mg total) by mouth 3 (three) times daily with meals. Patient not taking: Reported on 12/20/2017 05/05/17   [name], MD   Mr Foot Right W Wo Contrast  Result Date: 12/21/2017 CLINICAL DATA:  Right lateral foot wound near the fifth MTP joint. Evaluate for osteomyelitis. EXAM: MRI OF THE RIGHT FOREFOOT WITHOUT AND WITH CONTRAST TECHNIQUE: Multiplanar, multisequence MR imaging of the right forefoot was performed before and after the administration of intravenous contrast. CONTRAST:  10 mL Gadavist intravenous contrast. COMPARISON:  Right foot x-rays from yesterday. FINDINGS: Despite efforts by the technologist and patient, motion artifact is present on today's exam and could not be eliminated. This reduces exam sensitivity and specificity. Bones/Joint/Cartilage Increased edema and enhancement with subtle early T1 marrow hypointensity involving the fifth metatarsal head and fifth proximal phalanx, consistent with osteomyelitis. No fracture or dislocation. Normal alignment. No joint effusion. Mild osteoarthritis of the first MTP joint and fourth tarsometatarsal joint. Ligaments Collateral ligaments are intact. Muscles and Tendons Flexor, peroneal and extensor compartment tendons are intact. Increased T2 signal and atrophy within the intrinsic muscles of the forefoot, nonspecific, but likely related to diabetic  muscle changes. Soft tissue Small ulceration on the lateral forefoot adjacent to the fifth MTP joint extending close to the fifth metatarsal head. Mild dorsal forefoot soft tissue swelling. No fluid collection or hematoma. No soft tissue mass. IMPRESSION: 1. Small ulceration on the lateral forefoot adjacent to the fifth MTP joint with  underlying osteomyelitis of the fifth metatarsal head and fifth proximal phalanx. No abscess. Electronically Signed   By: Titus Dubin M.D.   On: 12/21/2017 22:30   Mr Tibia Fibula Left W Wo Contrast  Result Date: 12/21/2017 CLINICAL DATA:  Left below-knee amputation stump infection. Evaluate for osteomyelitis. EXAM: MRI OF LOWER LEFT EXTREMITY WITHOUT AND WITH CONTRAST TECHNIQUE: Multiplanar, multisequence MR imaging of the left proximal lower leg was performed both before and after administration of intravenous contrast. CONTRAST:  10 mL Gadavist intravenous contrast. COMPARISON:  Left knee x-rays dated December 20, 2017. FINDINGS: Bones/Joint/Cartilage Prior left below-knee amputation. There is subtle irregularity of the tibial stump with trace marrow edema and enhancement. Patchy red marrow in the distal femoral metaphysis. No fracture or dislocation. Normal alignment. No knee joint effusion. Moderate medial mild lateral compartment osteoarthritis. Ligaments Complex degenerative tearing of the medial meniscus body and posterior horn, as well as the lateral meniscus posterior horn. The cruciate and collateral ligaments are grossly intact. Muscles and Tendons Diffuse atrophy and mild edema of the visualized muscles in the proximal lower leg. Soft tissue There is a 4.2 x 7.2 x 4.1 cm rim enhancing fluid collection containing air in the anterior and medial aspect of the distal stump, draining to the skin anteriorly. The fluid collection abuts the tibial osteotomy. Moderate diffuse subcutaneous edema. IMPRESSION: 1. 7.2 cm soft tissue abscess of the below-knee amputation stump  which drains to the skin anteriorly and abuts the tibial osteotomy site, with underlying bony signal changes suspicious for very early osteomyelitis. Electronically Signed   By: Titus Dubin M.D.   On: 12/21/2017 22:40   Dg Knee Complete 4 Views Left  Result Date: 12/20/2017 CLINICAL DATA:  LEFT lower extremity infection, purulent range and fever overnight, increased erythema, prior BKA EXAM: LEFT KNEE - COMPLETE 4+ VIEW COMPARISON:  None FINDINGS: Post BKA. Osseous demineralization. Joint space narrowing LEFT knee. No acute fracture, dislocation, or bone destruction. Soft tissue swelling at stump. No definite soft tissue gas. Scattered atherosclerotic calcifications. IMPRESSION: Osseous mineralization LEFT knee with degenerative changes and prior LEFT BKA. Soft tissue swelling at stump without acute osseous abnormalities. Electronically Signed   By: Lavonia Dana M.D.   On: 12/20/2017 11:50   Dg Foot Complete Right  Result Date: 12/20/2017 CLINICAL DATA:  Wound on lateral side of RIGHT foot, diabetes mellitus EXAM: RIGHT FOOT COMPLETE - 3+ VIEW COMPARISON:  None FINDINGS: Osseous demineralization. Joint spaces preserved. Soft tissue wound with small amount of gas identified at the lateral margin of the fifth MTP joint. No fracture, dislocation, or bone destruction. Plantar and Achilles insertion calcaneal spur formation. IMPRESSION: No acute osseous abnormalities. Electronically Signed   By: Lavonia Dana M.D.   On: 12/20/2017 11:52   - pertinent xrays, CT, MRI studies were reviewed and independently interpreted  Positive ROS: All other systems have been reviewed and were otherwise negative with the exception of those mentioned in the HPI and as above.  Physical Exam: General: Alert, no acute distress Psychiatric: Patient is competent for consent with normal mood and affect Lymphatic: No axillary or cervical lymphadenopathy Cardiovascular: No pedal edema Respiratory: No cyanosis, no use of  accessory musculature GI: No organomegaly, abdomen is soft and non-tender    Images:  _0 @  Labs:  Lab Results  Component Value Date   HGBA1C 6.4 (A) 12/12/2017   HGBA1C 7.3 (A) 08/05/2017   HGBA1C 6.3 03/06/2017   ESRSEDRATE 35 (H) 03/31/2013   CRP 5.3 (H) 03/31/2013  REPTSTATUS 11/03/2016 FINAL 11/01/2016   GRAMSTAIN  12/30/2012    FEW WBC PRESENT, PREDOMINANTLY PMN RARE SQUAMOUS EPITHELIAL CELLS PRESENT ABUNDANT GRAM NEGATIVE RODS MODERATE GRAM POSITIVE COCCI IN PAIRS IN CLUSTERS   CULT  11/01/2016    NO GROWTH Performed at [facility], [place] [address] [place], [place] [address]    [place] OXYTOCA 12/15/2012   LABORGA PSEUDOMONAS AERUGINOSA 12/15/2012    Lab Results  Component Value Date   ALBUMIN 3.5 12/14/2017   ALBUMIN 4.1 02/19/2017   ALBUMIN 4.4 11/01/2016    Neurologic: Patient does not have protective sensation bilateral lower extremities.   MUSCULOSKELETAL:   Skin: Examination patient has a 1 cm in diameter ulcer which probes down to bone on the left transtibial amputation.  There is a large amount of purulent drainage.  Review of the MRI scan shows inflammation of the bone consistent with osteomyelitis with a large abscess.  Examination the right foot he has a necrotic ulcer over the fifth metatarsal head.  Review of the MRI scan shows osteomyelitis of the fifth metatarsal head with a large soft tissue defect.  There is no abscess no ascending cellulitis.  Patient has good hair growth on both lower extremities.  Assessment: Assessment: Diabetic insensate neuropathy with abscess and osteomyelitis left transtibial amputation with a short residual limb with osteomyelitis and ulceration of the fifth metatarsal head right foot.  Plan: Plan: We will need to proceed with a above-the-knee amputation on the left.  Patient does not have sufficient soft tissue or bone for a revision below the knee amputation on the left.  We will need to  proceed also with a right fifth ray amputation.  Patient will need discharge to skilled nursing postoperatively.  Anticipate surgery on Tuesday or Wednesday.  Thank you for the consult and the opportunity to see Mr. [place], MD [facility] [phone] 7:48 AM

## 2017-12-22 NOTE — Progress Notes (Signed)
Inpatient Diabetes Program Recommendations  AACE/ADA: New Consensus Statement on Inpatient Glycemic Control (2015)  Target Ranges:  Prepandial:   less than 140 mg/dL      Peak postprandial:   less than 180 mg/dL (1-2 hours)      Critically ill patients:  140 - 180 mg/dL   Lab Results  Component Value Date   GLUCAP 172 (H) 12/22/2017   HGBA1C 6.4 (A) 12/12/2017    Review of Glycemic Control Results for PRESTYN, STANCO (MRN [id number]) as of 12/22/2017 10:42  Ref. Range 12/21/2017 11:37 12/21/2017 17:36 12/22/2017 06:31  Glucose-Capillary Latest Ref Range: 70 - 99 mg/dL 251 (H) 205 (H) 172 (H)   Diabetes history: Type 2 DM Outpatient Diabetes medications: Toujeo 60 units QD, Novolog 35 units TID, Metformin 1000 mg BID Current orders for Inpatient glycemic control: Lantus 30 units QD, Novolog 0-15 units TID  Inpatient Diabetes Program Recommendations:    If post prandials continue to exceed 180 mg/dL, consider adding back a portion of patient's meal coverage: Novolog 5 units TID (assuming that patient is consuming >50% of meals).  Thanks, Bronson Curb, MSN, RNC-OB Diabetes Coordinator [phone] (8a-5p)

## 2017-12-22 NOTE — Progress Notes (Signed)
Pt places self on and off CPAP and resting comfortably at this time. Educated pt to call if he needed anything. RT to cont to monitor.

## 2017-12-22 NOTE — Plan of Care (Signed)
Problem: Education: Goal: Knowledge of General Education information will improve Description Including pain rating scale, medication(s)/side effects and non-pharmacologic comfort measures Outcome: Progressing   Problem: Health Behavior/Discharge Planning: Goal: Ability to manage health-related needs will improve Outcome: Progressing   Problem: Clinical Measurements: Goal: Respiratory complications will improve Outcome: Progressing   Problem: Nutrition: Goal: Adequate nutrition will be maintained Outcome: Progressing   Problem: Coping: Goal: Level of anxiety will decrease Outcome: Progressing   Problem: Elimination: Goal: Will not experience complications related to urinary retention Outcome: Progressing   Problem: Safety: Goal: Ability to remain free from injury will improve Outcome: Progressing   Problem: Skin Integrity: Goal: Risk for impaired skin integrity will decrease Outcome: Progressing

## 2017-12-22 NOTE — Progress Notes (Signed)
PROGRESS NOTE  [name] HBZ:[id number] DOB: Nov 20, 1950 DOA: 12/20/2017 PCP: [name], MD   LOS: 2 days   Brief Narrative / Interim history: Pleasant 67 year old male with history of diabetes, hypertension, hyperlipidemia, diabetic foot ulcer status post left below the knee amputation few years ago with chronic stump infection on antibiotics for the last several weeks and followed closely as an outpatient presents to the hospital on 12/14 with pain and redness in the left foot stump, as well as subjective fever and chills and generalized sweats.  Subjective: -No chest pain, no shortness of breath, no fever or chills.  No abdominal pain, nausea vomiting or diarrhea.  Good appetite.  Assessment & Plan: Principal Problem:   Cellulitis of extremity Active Problems:   Hyperlipemia   Coronary atherosclerosis of native coronary artery   Venous (peripheral) insufficiency   Osteoarthritis, multiple sites   Obstructive sleep apnea   Type 2 diabetes mellitus with diabetic polyneuropathy, with long-term current use of insulin (HCC)   Left below-knee amputee (HCC)   PAD (peripheral artery disease) (HCC)   Chronic renal disease, stage III (HCC)   Cellulitis and abscess of left lower extremity   Subacute osteomyelitis, right ankle and foot (HCC)   Principal Problem Cellulitis, abscess and osteomyelitis of the left stump -MRI obtained 12/15 shows significant abscess as well as early osteomyelitis.  Discussed with Dr. [name], he will need a revision with an above-the-knee amputation, scheduling to be determined.  For now continue broad-spectrum antibiotics.  Additional Problems Type 2 diabetes, insulin-dependent, well controlled, with vascular complications -Most recent hemoglobin A1c is 6.4.  He is doing well with his diabetes, will resume home Lantus at a lower dose given the fact that he is hospitalized and placed on sliding scale insulin -CBGs have been stable, continue current  regimen  Right foot chronic wound with osteomyelitis of the fifth metatarsal head and fifth proximal phalanx  -MRI obtained on 12/15, discussed with Dr. [name] from orthopedic surgery, will likely have surgery at the same time is #1  Hypertension -Continue Coreg, blood pressure stable  Peripheral arterial disease -Continue aspirin   Scheduled Meds: . aspirin  325 mg Oral Daily  . carvedilol  25 mg Oral BID WC  . enoxaparin (LOVENOX) injection  40 mg Subcutaneous Q24H  . insulin aspart  0-15 Units Subcutaneous TID WC  . insulin glargine  30 Units Subcutaneous Daily  . mupirocin ointment  1 application Topical Daily  . sodium chloride flush  3 mL Intravenous Q12H   Continuous Infusions: . sodium chloride    . piperacillin-tazobactam (ZOSYN)  IV 3.375 g (12/22/17 0630)  . vancomycin 1,000 mg (12/22/17 0330)   PRN Meds:.sodium chloride, gadobutrol, HYDROcodone-acetaminophen, sodium chloride flush  DVT prophylaxis: Lovenox Code Status: Full code Family Communication: Daughter present at bedside Disposition Plan: To be determined  Consultants:   Orthopedic surgery  Procedures:   None   Antimicrobials:  Vancomycin 12/15 >>  Zosyn 12/15 >>   Objective: Vitals:   12/21/17 1630 12/21/17 2135 12/22/17 0434 12/22/17 0819  BP: 132/71 140/65 135/71 135/68  Pulse: 83 84 83 88  Resp:    18  Temp: 98.4 F (36.9 C) 97.8 F (36.6 C) 97.8 F (36.6 C) 97.9 F (36.6 C)  TempSrc: Oral Oral Oral Oral  SpO2: 98% 98% 97% 99%  Weight:      Height:        Intake/Output Summary (Last 24 hours) at 12/22/2017 1115 Last data filed at 12/22/2017 1017 Gross  per 24 hour  Intake 372.01 ml  Output 925 ml  Net -552.99 ml   Filed Weights   12/20/17 1640  Weight: 128.1 kg    Examination:  Constitutional: He is in no distress Eyes: No scleral icterus ENMT: Moist mucous membranes Respiratory: Clear to auscultation bilaterally without wheezing or crackles, normal respiratory  effort, moves air well Cardiovascular: Regular rate and rhythm, no murmurs appreciated.  No peripheral edema Abdomen: Soft, abdomen is nontender, nondistended, bowel sounds are positive Musculoskeletal: No clubbing or cyanosis seen Skin: Wounds not inspected today, see note from 12/15 Neurologic: No focal deficits, equal strength in upper extremities Psychiatric: Alert and oriented x3   Data Reviewed: I have independently reviewed following labs and imaging studies   CBC: Recent Labs  Lab 12/20/17 1014 12/20/17 1811 12/21/17 0417 12/22/17 0346  WBC 15.2* 14.3* 12.4* 12.4*  NEUTROABS 12.0*  --   --   --   HGB 13.3 13.2 12.3* 12.4*  HCT 43.9 41.9 40.0 38.9*  MCV 86.4 84.5 85.1 84.6  PLT 366 347 334 308   Basic Metabolic Panel: Recent Labs  Lab 12/20/17 1014 12/20/17 1811 12/21/17 0417 12/22/17 0346  NA 138  --  138 137  K 4.7  --  4.2 4.1  CL 101  --  102 101  CO2 25  --  23 23  GLUCOSE 182*  --  153* 196*  BUN 25*  --  21 19  CREATININE 1.22 1.16 1.13 1.12  CALCIUM 9.2  --  8.8* 8.8*   GFR: Estimated Creatinine Clearance: 88.6 mL/min (by C-G formula based on SCr of 1.12 mg/dL). Liver Function Tests: No results for input(s): AST, ALT, ALKPHOS, BILITOT, PROT, ALBUMIN in the last 168 hours. No results for input(s): LIPASE, AMYLASE in the last 168 hours. No results for input(s): AMMONIA in the last 168 hours. Coagulation Profile: No results for input(s): INR, PROTIME in the last 168 hours. Cardiac Enzymes: No results for input(s): CKTOTAL, CKMB, CKMBINDEX, TROPONINI in the last 168 hours. BNP (last 3 results) No results for input(s): PROBNP in the last 8760 hours. HbA1C: No results for input(s): HGBA1C in the last 72 hours. CBG: Recent Labs  Lab 12/20/17 2128 12/21/17 0647 12/21/17 1137 12/21/17 1736 12/22/17 0631  GLUCAP 143* 137* 251* 205* 172*   Lipid Profile: No results for input(s): CHOL, HDL, LDLCALC, TRIG, CHOLHDL, LDLDIRECT in the last 72  hours. Thyroid Function Tests: No results for input(s): TSH, T4TOTAL, FREET4, T3FREE, THYROIDAB in the last 72 hours. Anemia Panel: No results for input(s): VITAMINB12, FOLATE, FERRITIN, TIBC, IRON, RETICCTPCT in the last 72 hours. Urine analysis:    Component Value Date/Time   COLORURINE YELLOW 12/14/2017 0020   APPEARANCEUR CLEAR 12/14/2017 0020   LABSPEC 1.021 12/14/2017 0020   PHURINE 5.0 12/14/2017 0020   GLUCOSEU NEGATIVE 12/14/2017 0020   HGBUR NEGATIVE 12/14/2017 0020   BILIRUBINUR NEGATIVE 12/14/2017 0020   BILIRUBINUR neg 04/21/2012 0904   KETONESUR 5 (A) 12/14/2017 0020   PROTEINUR NEGATIVE 12/14/2017 0020   UROBILINOGEN negative 04/21/2012 0904   NITRITE NEGATIVE 12/14/2017 0020   LEUKOCYTESUR NEGATIVE 12/14/2017 0020   Sepsis Labs: Invalid input(s): PROCALCITONIN, LACTICIDVEN  Recent Results (from the past 240 hour(s))  Blood culture (routine x 2)     Status: None (Preliminary result)   Collection Time: 12/20/17 10:14 AM  Result Value Ref Range Status   Specimen Description BLOOD RIGHT ANTECUBITAL  Final   Special Requests   Final    BOTTLES DRAWN AEROBIC AND  ANAEROBIC Blood Culture results may not be optimal due to an excessive volume of blood received in culture bottles   Culture   Final    NO GROWTH 2 DAYS Performed at [facility] [address], [place], [place] [address]    Report Status PENDING  Incomplete  Blood culture (routine x 2)     Status: None (Preliminary result)   Collection Time: 12/20/17 10:20 AM  Result Value Ref Range Status   Specimen Description BLOOD LEFT ANTECUBITAL  Final   Special Requests   Final    BOTTLES DRAWN AEROBIC AND ANAEROBIC Blood Culture results may not be optimal due to an excessive volume of blood received in culture bottles   Culture   Final    NO GROWTH 2 DAYS Performed at [facility], [place] [address], [place], [place] [address]    Report Status PENDING  Incomplete      Radiology Studies: Mr Foot  Right W Wo Contrast  Result Date: 12/21/2017 CLINICAL DATA:  Right lateral foot wound near the fifth MTP joint. Evaluate for osteomyelitis. EXAM: MRI OF THE RIGHT FOREFOOT WITHOUT AND WITH CONTRAST TECHNIQUE: Multiplanar, multisequence MR imaging of the right forefoot was performed before and after the administration of intravenous contrast. CONTRAST:  10 mL Gadavist intravenous contrast. COMPARISON:  Right foot x-rays from yesterday. FINDINGS: Despite efforts by the technologist and patient, motion artifact is present on today's exam and could not be eliminated. This reduces exam sensitivity and specificity. Bones/Joint/Cartilage Increased edema and enhancement with subtle early T1 marrow hypointensity involving the fifth metatarsal head and fifth proximal phalanx, consistent with osteomyelitis. No fracture or dislocation. Normal alignment. No joint effusion. Mild osteoarthritis of the first MTP joint and fourth tarsometatarsal joint. Ligaments Collateral ligaments are intact. Muscles and Tendons Flexor, peroneal and extensor compartment tendons are intact. Increased T2 signal and atrophy within the intrinsic muscles of the forefoot, nonspecific, but likely related to diabetic muscle changes. Soft tissue Small ulceration on the lateral forefoot adjacent to the fifth MTP joint extending close to the fifth metatarsal head. Mild dorsal forefoot soft tissue swelling. No fluid collection or hematoma. No soft tissue mass. IMPRESSION: 1. Small ulceration on the lateral forefoot adjacent to the fifth MTP joint with underlying osteomyelitis of the fifth metatarsal head and fifth proximal phalanx. No abscess. Electronically Signed   By: Titus Dubin M.D.   On: 12/21/2017 22:30   Mr Tibia Fibula Left W Wo Contrast  Result Date: 12/21/2017 CLINICAL DATA:  Left below-knee amputation stump infection. Evaluate for osteomyelitis. EXAM: MRI OF LOWER LEFT EXTREMITY WITHOUT AND WITH CONTRAST TECHNIQUE: Multiplanar,  multisequence MR imaging of the left proximal lower leg was performed both before and after administration of intravenous contrast. CONTRAST:  10 mL Gadavist intravenous contrast. COMPARISON:  Left knee x-rays dated December 20, 2017. FINDINGS: Bones/Joint/Cartilage Prior left below-knee amputation. There is subtle irregularity of the tibial stump with trace marrow edema and enhancement. Patchy red marrow in the distal femoral metaphysis. No fracture or dislocation. Normal alignment. No knee joint effusion. Moderate medial mild lateral compartment osteoarthritis. Ligaments Complex degenerative tearing of the medial meniscus body and posterior horn, as well as the lateral meniscus posterior horn. The cruciate and collateral ligaments are grossly intact. Muscles and Tendons Diffuse atrophy and mild edema of the visualized muscles in the proximal lower leg. Soft tissue There is a 4.2 x 7.2 x 4.1 cm rim enhancing fluid collection containing air in the anterior and medial aspect of  the distal stump, draining to the skin anteriorly. The fluid collection abuts the tibial osteotomy. Moderate diffuse subcutaneous edema. IMPRESSION: 1. 7.2 cm soft tissue abscess of the below-knee amputation stump which drains to the skin anteriorly and abuts the tibial osteotomy site, with underlying bony signal changes suspicious for very early osteomyelitis. Electronically Signed   By: Titus Dubin M.D.   On: 12/21/2017 22:40   Dg Knee Complete 4 Views Left  Result Date: 12/20/2017 CLINICAL DATA:  LEFT lower extremity infection, purulent range and fever overnight, increased erythema, prior BKA EXAM: LEFT KNEE - COMPLETE 4+ VIEW COMPARISON:  None FINDINGS: Post BKA. Osseous demineralization. Joint space narrowing LEFT knee. No acute fracture, dislocation, or bone destruction. Soft tissue swelling at stump. No definite soft tissue gas. Scattered atherosclerotic calcifications. IMPRESSION: Osseous mineralization LEFT knee with  degenerative changes and prior LEFT BKA. Soft tissue swelling at stump without acute osseous abnormalities. Electronically Signed   By: Lavonia Dana M.D.   On: 12/20/2017 11:50   Dg Foot Complete Right  Result Date: 12/20/2017 CLINICAL DATA:  Wound on lateral side of RIGHT foot, diabetes mellitus EXAM: RIGHT FOOT COMPLETE - 3+ VIEW COMPARISON:  None FINDINGS: Osseous demineralization. Joint spaces preserved. Soft tissue wound with small amount of gas identified at the lateral margin of the fifth MTP joint. No fracture, dislocation, or bone destruction. Plantar and Achilles insertion calcaneal spur formation. IMPRESSION: No acute osseous abnormalities. Electronically Signed   By: Lavonia Dana M.D.   On: 12/20/2017 11:52    Marzetta Board, MD, PhD [facility] Pager [phone]  If 7PM-7AM, please contact night-coverage [email or web address] Password [email or web address] 12/22/2017, 11:15 AM

## 2017-12-23 LAB — GLUCOSE, CAPILLARY
GLUCOSE-CAPILLARY: 166 mg/dL — AB (ref 70–99)
Glucose-Capillary: 185 mg/dL — ABNORMAL HIGH (ref 70–99)
Glucose-Capillary: 216 mg/dL — ABNORMAL HIGH (ref 70–99)
Glucose-Capillary: 170 mg/dL — ABNORMAL HIGH (ref 70–99)

## 2017-12-23 LAB — SURGICAL PCR SCREEN
MRSA, PCR: NEGATIVE
Staphylococcus aureus: POSITIVE — AB

## 2017-12-23 MED ORDER — MUPIROCIN 2 % EX OINT
1.0000 "application " | TOPICAL_OINTMENT | Freq: Two times a day (BID) | CUTANEOUS | Status: DC
  Administered 2017-12-23 – 2017-12-26 (×5): 1 via NASAL
  Filled 2017-12-23: qty 22

## 2017-12-23 MED ORDER — CHLORHEXIDINE GLUCONATE CLOTH 2 % EX PADS
6.0000 | MEDICATED_PAD | Freq: Every day | CUTANEOUS | Status: DC
  Administered 2017-12-23 – 2017-12-26 (×4): 6 via TOPICAL

## 2017-12-23 MED ORDER — DEXTROSE 5 % IV SOLN
3.0000 g | INTRAVENOUS | Status: AC
  Administered 2017-12-24: 3 g via INTRAVENOUS
  Filled 2017-12-23: qty 3

## 2017-12-23 MED ORDER — CHLORHEXIDINE GLUCONATE 4 % EX LIQD: 60.0000 mL | Freq: Once | CUTANEOUS | Status: DC

## 2017-12-23 NOTE — Progress Notes (Signed)
Patient self-administers CPAP.  He is familiar with equipment and procedure and knows to call with any issues.

## 2017-12-23 NOTE — Progress Notes (Signed)
PROGRESS NOTE  SAHITH NURSE CBU:[id number] DOB: [birth year] DOA: 12/20/2017 PCP: [name], MD   LOS: 3 days   Brief Narrative / Interim history: Pleasant 67 year old male with history of diabetes, hypertension, hyperlipidemia, diabetic foot ulcer status post left below the knee amputation few years ago with chronic stump infection on antibiotics for the last several weeks and followed closely as an outpatient presents to the hospital on 12/14 with pain and redness in the left foot stump, as well as subjective fever and chills and generalized sweats.  Subjective: -Feels well, no chest pain, no shortness of breath, awaiting surgery tomorrow.  No fever or chills.  Assessment & Plan: Principal Problem:   Cellulitis of extremity Active Problems:   Hyperlipemia   Coronary atherosclerosis of native coronary artery   Venous (peripheral) insufficiency   Osteoarthritis, multiple sites   Obstructive sleep apnea   Type 2 diabetes mellitus with diabetic polyneuropathy, with long-term current use of insulin (HCC)   Left below-knee amputee (HCC)   PAD (peripheral artery disease) (HCC)   Chronic renal disease, stage III (HCC)   Cellulitis and abscess of left lower extremity   Subacute osteomyelitis, right ankle and foot (HCC)   Principal Problem Cellulitis, abscess and osteomyelitis of the left stump -MRI obtained 12/15 shows significant abscess as well as early osteomyelitis.  Discussed with Dr. [name], he will need a revision with an above-the-knee amputation, to be done tomorrow, for now continue broad-spectrum antibiotics -Closely monitor renal function while on vancomycin  Additional Problems Type 2 diabetes, insulin-dependent, well controlled, with vascular complications -Most recent hemoglobin A1c is 6.4.  He is doing well with his diabetes, will resume home Lantus at a lower dose given the fact that he is hospitalized and placed on sliding scale insulin -CBGs stable, fasting this  morning 180, continue current regimen  Right foot chronic wound with osteomyelitis of the fifth metatarsal head and fifth proximal phalanx  -MRI obtained on 12/15, discussed with Dr. [name] from orthopedic surgery, surgery tomorrow  Hypertension -Continue Coreg, blood pressure within normal limits today  Peripheral arterial disease -Continue aspirin   Scheduled Meds: . aspirin  325 mg Oral Daily  . carvedilol  25 mg Oral BID WC  . enoxaparin (LOVENOX) injection  40 mg Subcutaneous Q24H  . insulin aspart  0-15 Units Subcutaneous TID WC  . insulin glargine  30 Units Subcutaneous Daily  . mupirocin ointment  1 application Topical Daily  . sodium chloride flush  3 mL Intravenous Q12H   Continuous Infusions: . sodium chloride    . [START ON 12/24/2017]  ceFAZolin (ANCEF) IV    . piperacillin-tazobactam (ZOSYN)  IV 3.375 g (12/23/17 0706)  . vancomycin 1,000 mg (12/23/17 0407)   PRN Meds:.sodium chloride, gadobutrol, HYDROcodone-acetaminophen, sodium chloride flush  DVT prophylaxis: Lovenox Code Status: Full code Family Communication: Wife at bedside Disposition Plan: To be determined  Consultants:   Orthopedic surgery  Procedures:   None   Antimicrobials:  Vancomycin 12/15 >>  Zosyn 12/15 >>   Objective: Vitals:   12/22/17 1659 12/22/17 2138 12/23/17 0512 12/23/17 0847  BP: 133/70 133/68 140/76 132/72  Pulse: 74 81 88 (!) 102  Resp:  18  18  Temp:  98.2 F (36.8 C)  97.8 F (36.6 C)  TempSrc:  Oral  Oral  SpO2:  98% 99% 98%  Weight:      Height:        Intake/Output Summary (Last 24 hours) at 12/23/2017 1114 Last data filed at  12/23/2017 0814 Gross per 24 hour  Intake 720 ml  Output 1950 ml  Net -1230 ml   Filed Weights   12/20/17 1640  Weight: 128.1 kg    Examination:  Constitutional: NAD Eyes: No icterus ENMT: Moist mucous membranes Respiratory: Clear to auscultation bilaterally Cardiovascular: Regular rate and rhythm Abdomen: Soft,  nontender, nondistended, positive bowel sounds Musculoskeletal: No clubbing or cyanosis seen Skin: Wounds wrapped Neurologic: Nonfocal, equal strength Psychiatric: Alert and oriented x3.  Normal mood   Data Reviewed: I have independently reviewed following labs and imaging studies   CBC: Recent Labs  Lab 12/20/17 1014 12/20/17 1811 12/21/17 0417 12/22/17 0346  WBC 15.2* 14.3* 12.4* 12.4*  NEUTROABS 12.0*  --   --   --   HGB 13.3 13.2 12.3* 12.4*  HCT 43.9 41.9 40.0 38.9*  MCV 86.4 84.5 85.1 84.6  PLT 366 347 334 734   Basic Metabolic Panel: Recent Labs  Lab 12/20/17 1014 12/20/17 1811 12/21/17 0417 12/22/17 0346  NA 138  --  138 137  K 4.7  --  4.2 4.1  CL 101  --  102 101  CO2 25  --  23 23  GLUCOSE 182*  --  153* 196*  BUN 25*  --  21 19  CREATININE 1.22 1.16 1.13 1.12  CALCIUM 9.2  --  8.8* 8.8*   GFR: Estimated Creatinine Clearance: 88.6 mL/min (by C-G formula based on SCr of 1.12 mg/dL). Liver Function Tests: No results for input(s): AST, ALT, ALKPHOS, BILITOT, PROT, ALBUMIN in the last 168 hours. No results for input(s): LIPASE, AMYLASE in the last 168 hours. No results for input(s): AMMONIA in the last 168 hours. Coagulation Profile: No results for input(s): INR, PROTIME in the last 168 hours. Cardiac Enzymes: No results for input(s): CKTOTAL, CKMB, CKMBINDEX, TROPONINI in the last 168 hours. BNP (last 3 results) No results for input(s): PROBNP in the last 8760 hours. HbA1C: No results for input(s): HGBA1C in the last 72 hours. CBG: Recent Labs  Lab 12/22/17 0631 12/22/17 1148 12/22/17 1650 12/22/17 2137 12/23/17 0612  GLUCAP 172* 200* 166* 142* 185*   Lipid Profile: No results for input(s): CHOL, HDL, LDLCALC, TRIG, CHOLHDL, LDLDIRECT in the last 72 hours. Thyroid Function Tests: No results for input(s): TSH, T4TOTAL, FREET4, T3FREE, THYROIDAB in the last 72 hours. Anemia Panel: No results for input(s): VITAMINB12, FOLATE, FERRITIN, TIBC,  IRON, RETICCTPCT in the last 72 hours. Urine analysis:    Component Value Date/Time   COLORURINE YELLOW 12/14/2017 0020   APPEARANCEUR CLEAR 12/14/2017 0020   LABSPEC 1.021 12/14/2017 0020   PHURINE 5.0 12/14/2017 0020   GLUCOSEU NEGATIVE 12/14/2017 0020   HGBUR NEGATIVE 12/14/2017 0020   BILIRUBINUR NEGATIVE 12/14/2017 0020   BILIRUBINUR neg 04/21/2012 0904   KETONESUR 5 (A) 12/14/2017 0020   PROTEINUR NEGATIVE 12/14/2017 0020   UROBILINOGEN negative 04/21/2012 0904   NITRITE NEGATIVE 12/14/2017 0020   LEUKOCYTESUR NEGATIVE 12/14/2017 0020   Sepsis Labs: Invalid input(s): PROCALCITONIN, LACTICIDVEN  Recent Results (from the past 240 hour(s))  Blood culture (routine x 2)     Status: None (Preliminary result)   Collection Time: 12/20/17 10:14 AM  Result Value Ref Range Status   Specimen Description BLOOD RIGHT ANTECUBITAL  Final   Special Requests   Final    BOTTLES DRAWN AEROBIC AND ANAEROBIC Blood Culture results may not be optimal due to an excessive volume of blood received in culture bottles   Culture   Final    NO GROWTH  3 DAYS Performed at [facility], [place] [address], [place], [place] [address]    Report Status PENDING  Incomplete  Blood culture (routine x 2)     Status: None (Preliminary result)   Collection Time: 12/20/17 10:20 AM  Result Value Ref Range Status   Specimen Description BLOOD LEFT ANTECUBITAL  Final   Special Requests   Final    BOTTLES DRAWN AEROBIC AND ANAEROBIC Blood Culture results may not be optimal due to an excessive volume of blood received in culture bottles   Culture   Final    NO GROWTH 3 DAYS Performed at [facility], [employer] [address] [place], [place] [address]    Report Status PENDING  Incomplete  Surgical pcr screen     Status: Abnormal   Collection Time: 12/23/17 12:34 AM  Result Value Ref Range Status   MRSA, PCR NEGATIVE NEGATIVE Final   Staphylococcus aureus POSITIVE (A) NEGATIVE Final    Comment: (NOTE) The Xpert  SA Assay (FDA approved for NASAL specimens in patients 16 years of age and older), is one component of a comprehensive surveillance program. It is not intended to diagnose infection nor to guide or monitor treatment. Performed at [facility], [place] [address], [place], [place] [address]       Radiology Studies: Mr Foot Right W Wo Contrast  Result Date: 12/21/2017 CLINICAL DATA:  Right lateral foot wound near the fifth MTP joint. Evaluate for osteomyelitis. EXAM: MRI OF THE RIGHT FOREFOOT WITHOUT AND WITH CONTRAST TECHNIQUE: Multiplanar, multisequence MR imaging of the right forefoot was performed before and after the administration of intravenous contrast. CONTRAST:  10 mL Gadavist intravenous contrast. COMPARISON:  Right foot x-rays from yesterday. FINDINGS: Despite efforts by the technologist and patient, motion artifact is present on today's exam and could not be eliminated. This reduces exam sensitivity and specificity. Bones/Joint/Cartilage Increased edema and enhancement with subtle early T1 marrow hypointensity involving the fifth metatarsal head and fifth proximal phalanx, consistent with osteomyelitis. No fracture or dislocation. Normal alignment. No joint effusion. Mild osteoarthritis of the first MTP joint and fourth tarsometatarsal joint. Ligaments Collateral ligaments are intact. Muscles and Tendons Flexor, peroneal and extensor compartment tendons are intact. Increased T2 signal and atrophy within the intrinsic muscles of the forefoot, nonspecific, but likely related to diabetic muscle changes. Soft tissue Small ulceration on the lateral forefoot adjacent to the fifth MTP joint extending close to the fifth metatarsal head. Mild dorsal forefoot soft tissue swelling. No fluid collection or hematoma. No soft tissue mass. IMPRESSION: 1. Small ulceration on the lateral forefoot adjacent to the fifth MTP joint with underlying osteomyelitis of the fifth metatarsal head and fifth proximal  phalanx. No abscess. Electronically Signed   By: Titus Dubin M.D.   On: 12/21/2017 22:30   Mr Tibia Fibula Left W Wo Contrast  Result Date: 12/21/2017 CLINICAL DATA:  Left below-knee amputation stump infection. Evaluate for osteomyelitis. EXAM: MRI OF LOWER LEFT EXTREMITY WITHOUT AND WITH CONTRAST TECHNIQUE: Multiplanar, multisequence MR imaging of the left proximal lower leg was performed both before and after administration of intravenous contrast. CONTRAST:  10 mL Gadavist intravenous contrast. COMPARISON:  Left knee x-rays dated December 20, 2017. FINDINGS: Bones/Joint/Cartilage Prior left below-knee amputation. There is subtle irregularity of the tibial stump with trace marrow edema and enhancement. Patchy red marrow in the distal femoral metaphysis. No fracture or dislocation. Normal alignment. No knee joint effusion. Moderate medial mild lateral compartment osteoarthritis. Ligaments Complex degenerative tearing  of the medial meniscus body and posterior horn, as well as the lateral meniscus posterior horn. The cruciate and collateral ligaments are grossly intact. Muscles and Tendons Diffuse atrophy and mild edema of the visualized muscles in the proximal lower leg. Soft tissue There is a 4.2 x 7.2 x 4.1 cm rim enhancing fluid collection containing air in the anterior and medial aspect of the distal stump, draining to the skin anteriorly. The fluid collection abuts the tibial osteotomy. Moderate diffuse subcutaneous edema. IMPRESSION: 1. 7.2 cm soft tissue abscess of the below-knee amputation stump which drains to the skin anteriorly and abuts the tibial osteotomy site, with underlying bony signal changes suspicious for very early osteomyelitis. Electronically Signed   By: Titus Dubin M.D.   On: 12/21/2017 22:40    Marzetta Board, MD, PhD [facility] Pager [phone]  If 7PM-7AM, please contact night-coverage [email or web address] Password [email or web address] 12/23/2017, 11:14 AM

## 2017-12-23 NOTE — Plan of Care (Signed)
  Problem: Education: Goal: Knowledge of General Education information will improve Description Including pain rating scale, medication(s)/side effects and non-pharmacologic comfort measures Outcome: Progressing   Problem: Safety: Goal: Ability to remain free from injury will improve Outcome: Progressing   Problem: Skin Integrity: Goal: Risk for impaired skin integrity will decrease Outcome: Progressing   Problem: Activity: Goal: Risk for activity intolerance will decrease Outcome: Progressing   Problem: Clinical Measurements: Goal: Will remain free from infection Outcome: Progressing

## 2017-12-23 NOTE — Plan of Care (Signed)
Problem: Education: Goal: Knowledge of General Education information will improve Description Including pain rating scale, medication(s)/side effects and non-pharmacologic comfort measures Outcome: Progressing   Problem: Health Behavior/Discharge Planning: Goal: Ability to manage health-related needs will improve Outcome: Progressing   Problem: Activity: Goal: Risk for activity intolerance will decrease Outcome: Progressing   Problem: Nutrition: Goal: Adequate nutrition will be maintained Outcome: Progressing   Problem: Coping: Goal: Level of anxiety will decrease Outcome: Progressing   Problem: Elimination: Goal: Will not experience complications related to urinary retention Outcome: Progressing   Problem: Safety: Goal: Ability to remain free from injury will improve Outcome: Progressing   Problem: Skin Integrity: Goal: Risk for impaired skin integrity will decrease Outcome: Progressing

## 2017-12-23 NOTE — Care Management Important Message (Signed)
Important Message  Patient Details  Name: [name] MRN: [id number] Date of Birth: 1950/04/25   Medicare Important Message Given:  Yes    Jalesha Plotz Montine Circle 12/23/2017, 4:14 PM

## 2017-12-24 ENCOUNTER — Encounter (HOSPITAL_COMMUNITY): Payer: Self-pay | Admitting: Orthopedic Surgery

## 2017-12-24 ENCOUNTER — Encounter (HOSPITAL_COMMUNITY)
Admission: EM | Disposition: A | Discharge status: Rehabilitation Facility | Payer: Self-pay | Source: Home / Self Care | Attending: Internal Medicine

## 2017-12-24 ENCOUNTER — Inpatient Hospital Stay (HOSPITAL_COMMUNITY): Payer: Medicare Other | Admitting: Certified Registered"

## 2017-12-24 HISTORY — PX: AMPUTATION: SHX166

## 2017-12-24 LAB — GLUCOSE, CAPILLARY
Glucose-Capillary: 221 mg/dL — ABNORMAL HIGH (ref 70–99)
Glucose-Capillary: 168 mg/dL — ABNORMAL HIGH (ref 70–99)
Glucose-Capillary: 141 mg/dL — ABNORMAL HIGH (ref 70–99)
Glucose-Capillary: 161 mg/dL — ABNORMAL HIGH (ref 70–99)
Glucose-Capillary: 354 mg/dL — ABNORMAL HIGH (ref 70–99)

## 2017-12-24 LAB — CBC
HCT: 38.4 % — ABNORMAL LOW (ref 39.0–52.0)
Hemoglobin: 12.2 g/dL — ABNORMAL LOW (ref 13.0–17.0)
MCH: 26.7 pg (ref 26.0–34.0)
MCHC: 31.8 g/dL (ref 30.0–36.0)
MCV: 84 fL (ref 80.0–100.0)
Platelets: 290 10*3/uL (ref 150–400)
RBC: 4.57 MIL/uL (ref 4.22–5.81)
RDW: 16.2 % — ABNORMAL HIGH (ref 11.5–15.5)
WBC: 12 10*3/uL — ABNORMAL HIGH (ref 4.0–10.5)
nRBC: 0 % (ref 0.0–0.2)

## 2017-12-24 LAB — BASIC METABOLIC PANEL
Anion gap: 9 (ref 5–15)
BUN: 18 mg/dL (ref 8–23)
CO2: 25 mmol/L (ref 22–32)
Calcium: 8.7 mg/dL — ABNORMAL LOW (ref 8.9–10.3)
Chloride: 104 mmol/L (ref 98–111)
Creatinine, Ser: 1.15 mg/dL (ref 0.61–1.24)
GFR calc Af Amer: >60 mL/min (ref >60)
GFR calc non Af Amer: >60 mL/min (ref >60)
Glucose, Bld: 228 mg/dL — ABNORMAL HIGH (ref 70–99)
POTASSIUM: 4.2 mmol/L (ref 3.5–5.1)
Sodium: 138 mmol/L (ref 135–145)

## 2017-12-24 SURGERY — AMPUTATION, ABOVE KNEE
Anesthesia: General | Laterality: Right

## 2017-12-24 MED ORDER — ONDANSETRON HCL 4 MG/2ML IJ SOLN
INTRAMUSCULAR | Status: DC | PRN
  Administered 2017-12-24: 4 mg via INTRAVENOUS

## 2017-12-24 MED ORDER — OXYCODONE HCL 5 MG PO TABS
5.0000 mg | ORAL_TABLET | ORAL | Status: DC | PRN
  Filled 2017-12-24: qty 2

## 2017-12-24 MED ORDER — FENTANYL CITRATE (PF) 100 MCG/2ML IJ SOLN
INTRAMUSCULAR | Status: DC | PRN
  Administered 2017-12-24 (×4): 50 ug via INTRAVENOUS

## 2017-12-24 MED ORDER — BISACODYL 10 MG RE SUPP: 10.0000 mg | Freq: Every day | RECTAL | Status: DC | PRN

## 2017-12-24 MED ORDER — OXYCODONE HCL 5 MG PO TABS
10.0000 mg | ORAL_TABLET | ORAL | Status: DC | PRN
  Administered 2017-12-24 – 2017-12-26 (×3): 15 mg via ORAL
  Filled 2017-12-24 (×3): qty 3

## 2017-12-24 MED ORDER — POLYETHYLENE GLYCOL 3350 17 G PO PACK: 17.0000 g | PACK | Freq: Every day | ORAL | Status: DC | PRN

## 2017-12-24 MED ORDER — METHOCARBAMOL 500 MG PO TABS
500.0000 mg | ORAL_TABLET | Freq: Four times a day (QID) | ORAL | Status: DC | PRN
  Administered 2017-12-24 – 2017-12-25 (×2): 500 mg via ORAL
  Filled 2017-12-24 (×2): qty 1

## 2017-12-24 MED ORDER — ONDANSETRON HCL 4 MG/2ML IJ SOLN
INTRAMUSCULAR | Status: AC
  Filled 2017-12-24: qty 2

## 2017-12-24 MED ORDER — PROMETHAZINE HCL 25 MG/ML IJ SOLN: 6.2500 mg | INTRAMUSCULAR | Status: DC | PRN

## 2017-12-24 MED ORDER — DOCUSATE SODIUM 100 MG PO CAPS
100.0000 mg | ORAL_CAPSULE | Freq: Two times a day (BID) | ORAL | Status: DC
  Administered 2017-12-24 – 2017-12-26 (×3): 100 mg via ORAL
  Filled 2017-12-24 (×4): qty 1

## 2017-12-24 MED ORDER — FENTANYL CITRATE (PF) 250 MCG/5ML IJ SOLN
INTRAMUSCULAR | Status: AC
  Filled 2017-12-24: qty 5

## 2017-12-24 MED ORDER — PHENYLEPHRINE 40 MCG/ML (10ML) SYRINGE FOR IV PUSH (FOR BLOOD PRESSURE SUPPORT)
PREFILLED_SYRINGE | INTRAVENOUS | Status: DC | PRN
  Administered 2017-12-24: 120 ug via INTRAVENOUS
  Administered 2017-12-24: 160 ug via INTRAVENOUS
  Administered 2017-12-24: 120 ug via INTRAVENOUS

## 2017-12-24 MED ORDER — ACETAMINOPHEN 325 MG PO TABS: 325.0000 mg | ORAL_TABLET | Freq: Four times a day (QID) | ORAL | Status: DC | PRN

## 2017-12-24 MED ORDER — HYDROMORPHONE HCL 1 MG/ML IJ SOLN
0.2500 mg | INTRAMUSCULAR | Status: DC | PRN
  Administered 2017-12-24: 0.5 mg via INTRAVENOUS

## 2017-12-24 MED ORDER — MIDAZOLAM HCL 2 MG/2ML IJ SOLN: 0.5000 mg | Freq: Once | INTRAMUSCULAR | Status: DC | PRN

## 2017-12-24 MED ORDER — METHOCARBAMOL 1000 MG/10ML IJ SOLN
500.0000 mg | Freq: Four times a day (QID) | INTRAVENOUS | Status: DC | PRN
  Filled 2017-12-24: qty 5

## 2017-12-24 MED ORDER — DEXAMETHASONE SODIUM PHOSPHATE 10 MG/ML IJ SOLN
INTRAMUSCULAR | Status: AC
  Filled 2017-12-24: qty 1

## 2017-12-24 MED ORDER — INSULIN ASPART 100 UNIT/ML ~~LOC~~ SOLN
5.0000 [IU] | Freq: Once | SUBCUTANEOUS | Status: AC
  Administered 2017-12-24: 5 [IU] via SUBCUTANEOUS

## 2017-12-24 MED ORDER — MAGNESIUM CITRATE PO SOLN: 1.0000 | Freq: Once | ORAL | Status: DC | PRN

## 2017-12-24 MED ORDER — DEXAMETHASONE SODIUM PHOSPHATE 10 MG/ML IJ SOLN
INTRAMUSCULAR | Status: DC | PRN
  Administered 2017-12-24: 5 mg via INTRAVENOUS

## 2017-12-24 MED ORDER — ONDANSETRON HCL 4 MG PO TABS
4.0000 mg | ORAL_TABLET | Freq: Four times a day (QID) | ORAL | Status: DC | PRN
  Administered 2017-12-24: 4 mg via ORAL
  Filled 2017-12-24: qty 1

## 2017-12-24 MED ORDER — HYDROMORPHONE HCL 1 MG/ML IJ SOLN
INTRAMUSCULAR | Status: AC
  Filled 2017-12-24: qty 1

## 2017-12-24 MED ORDER — TAMSULOSIN HCL 0.4 MG PO CAPS
0.4000 mg | ORAL_CAPSULE | Freq: Every day | ORAL | Status: DC
  Administered 2017-12-25 – 2017-12-26 (×2): 0.4 mg via ORAL
  Filled 2017-12-24 (×2): qty 1

## 2017-12-24 MED ORDER — LACTATED RINGERS IV SOLN
INTRAVENOUS | Status: DC
  Administered 2017-12-24: 12:00:00 via INTRAVENOUS

## 2017-12-24 MED ORDER — 0.9 % SODIUM CHLORIDE (POUR BTL) OPTIME
TOPICAL | Status: DC | PRN
  Administered 2017-12-24: 1000 mL

## 2017-12-24 MED ORDER — METOCLOPRAMIDE HCL 5 MG/ML IJ SOLN: 5.0000 mg | Freq: Three times a day (TID) | INTRAMUSCULAR | Status: DC | PRN

## 2017-12-24 MED ORDER — HYDROMORPHONE HCL 1 MG/ML IJ SOLN
0.5000 mg | INTRAMUSCULAR | Status: DC | PRN
  Administered 2017-12-24 – 2017-12-25 (×2): 1 mg via INTRAVENOUS
  Filled 2017-12-24 (×2): qty 1

## 2017-12-24 MED ORDER — LIDOCAINE 2% (20 MG/ML) 5 ML SYRINGE
INTRAMUSCULAR | Status: AC
  Filled 2017-12-24: qty 5

## 2017-12-24 MED ORDER — MEPERIDINE HCL 50 MG/ML IJ SOLN: 6.2500 mg | INTRAMUSCULAR | Status: DC | PRN

## 2017-12-24 MED ORDER — PHENYLEPHRINE 40 MCG/ML (10ML) SYRINGE FOR IV PUSH (FOR BLOOD PRESSURE SUPPORT)
PREFILLED_SYRINGE | INTRAVENOUS | Status: AC
  Filled 2017-12-24: qty 10

## 2017-12-24 MED ORDER — PROPOFOL 10 MG/ML IV BOLUS
INTRAVENOUS | Status: DC | PRN
  Administered 2017-12-24: 150 mg via INTRAVENOUS

## 2017-12-24 MED ORDER — LIDOCAINE 2% (20 MG/ML) 5 ML SYRINGE
INTRAMUSCULAR | Status: DC | PRN
  Administered 2017-12-24: 40 mg via INTRAVENOUS

## 2017-12-24 MED ORDER — METOCLOPRAMIDE HCL 5 MG PO TABS: 5.0000 mg | ORAL_TABLET | Freq: Three times a day (TID) | ORAL | Status: DC | PRN

## 2017-12-24 MED ORDER — ONDANSETRON HCL 4 MG/2ML IJ SOLN: 4.0000 mg | Freq: Four times a day (QID) | INTRAMUSCULAR | Status: DC | PRN

## 2017-12-24 MED ORDER — SODIUM CHLORIDE 0.9 % IV SOLN
INTRAVENOUS | Status: DC
  Administered 2017-12-24: 17:00:00 via INTRAVENOUS

## 2017-12-24 SURGICAL SUPPLY — 47 items
BLADE SAW RECIP 87.9 MT (BLADE) ×3 IMPLANT
BLADE SAW SGTL MED 73X18.5 STR (BLADE) IMPLANT
BLADE SURG 21 STRL SS (BLADE) ×3 IMPLANT
BNDG COHESIVE 4X5 TAN STRL (GAUZE/BANDAGES/DRESSINGS) ×3 IMPLANT
BNDG COHESIVE 6X5 TAN STRL LF (GAUZE/BANDAGES/DRESSINGS) ×3 IMPLANT
BNDG GAUZE ELAST 4 BULKY (GAUZE/BANDAGES/DRESSINGS) ×3 IMPLANT
CANISTER WOUND CARE 500ML ATS (WOUND CARE) ×1 IMPLANT
CANISTER WOUNDNEG PRESSURE 500 (CANNISTER) ×2 IMPLANT
COVER SURGICAL LIGHT HANDLE (MISCELLANEOUS) ×6 IMPLANT
COVER WAND RF STERILE (DRAPES) ×3 IMPLANT
CUFF TOURNIQUET SINGLE 34IN LL (TOURNIQUET CUFF) IMPLANT
DRAPE INCISE IOBAN 66X45 STRL (DRAPES) ×6 IMPLANT
DRAPE U-SHAPE 47X51 STRL (DRAPES) ×6 IMPLANT
DRESSING PREVENA PLUS CUSTOM (GAUZE/BANDAGES/DRESSINGS) ×2 IMPLANT
DRESSING VERAFLO CLEANSE CC (GAUZE/BANDAGES/DRESSINGS) IMPLANT
DRSG ADAPTIC 3X8 NADH LF (GAUZE/BANDAGES/DRESSINGS) ×3 IMPLANT
DRSG PAD ABDOMINAL 8X10 ST (GAUZE/BANDAGES/DRESSINGS) ×6 IMPLANT
DRSG PREVENA PLUS CUSTOM (GAUZE/BANDAGES/DRESSINGS) ×3
DRSG VERAFLO CLEANSE CC (GAUZE/BANDAGES/DRESSINGS) ×6
DURAPREP 26ML APPLICATOR (WOUND CARE) ×4 IMPLANT
ELECT REM PT RETURN 9FT ADLT (ELECTROSURGICAL) ×3
ELECTRODE REM PT RTRN 9FT ADLT (ELECTROSURGICAL) ×2 IMPLANT
GAUZE SPONGE 4X4 12PLY STRL (GAUZE/BANDAGES/DRESSINGS) ×3 IMPLANT
GLOVE BIOGEL PI IND STRL 9 (GLOVE) ×2 IMPLANT
GLOVE BIOGEL PI INDICATOR 9 (GLOVE) ×1
GLOVE SURG ORTHO 9.0 STRL STRW (GLOVE) ×3 IMPLANT
GOWN STRL REUS W/ TWL XL LVL3 (GOWN DISPOSABLE) ×4 IMPLANT
GOWN STRL REUS W/TWL XL LVL3 (GOWN DISPOSABLE) ×2
KIT BASIN OR (CUSTOM PROCEDURE TRAY) ×3 IMPLANT
KIT DRSG PREVENA PLUS 7DAY 125 (MISCELLANEOUS) ×1 IMPLANT
KIT TURNOVER KIT B (KITS) ×3 IMPLANT
MANIFOLD NEPTUNE II (INSTRUMENTS) ×3 IMPLANT
NS IRRIG 1000ML POUR BTL (IV SOLUTION) ×3 IMPLANT
PACK ORTHO EXTREMITY (CUSTOM PROCEDURE TRAY) ×3 IMPLANT
PAD ARMBOARD 7.5X6 YLW CONV (MISCELLANEOUS) ×6 IMPLANT
SPONGE LAP 18X18 RF (DISPOSABLE) ×1 IMPLANT
SPONGE LAP 18X36 RFD (DISPOSABLE) ×1 IMPLANT
STAPLER VISISTAT 35W (STAPLE) IMPLANT
STOCKINETTE IMPERVIOUS LG (DRAPES) IMPLANT
SUT ETHILON 2 0 PSLX (SUTURE) ×9 IMPLANT
SUT SILK 2 0 (SUTURE) ×1
SUT SILK 2-0 18XBRD TIE 12 (SUTURE) ×2 IMPLANT
TOWEL GREEN STERILE FF (TOWEL DISPOSABLE) ×3 IMPLANT
TOWEL OR 17X26 10 PK STRL BLUE (TOWEL DISPOSABLE) ×3 IMPLANT
TUBE CONNECTING 12X1/4 (SUCTIONS) ×3 IMPLANT
TUBE CONNECTING 20X1/4 (TUBING) ×2 IMPLANT
YANKAUER SUCT BULB TIP NO VENT (SUCTIONS) ×3 IMPLANT

## 2017-12-24 NOTE — Anesthesia Postprocedure Evaluation (Signed)
Anesthesia Post Note  Patient: [name]  Procedure(s) Performed: LEFT ABOVE KNEE AMPUTATION (Left ) RIGHT 5TH RAY AMPUTATION (Right )     Patient location during evaluation: PACU Anesthesia Type: General Level of consciousness: awake and alert, patient cooperative and oriented Pain management: pain level controlled Vital Signs Assessment: post-procedure vital signs reviewed and stable Respiratory status: spontaneous breathing, nonlabored ventilation, respiratory function stable and patient connected to nasal cannula oxygen Cardiovascular status: blood pressure returned to baseline and stable Postop Assessment: no apparent nausea or vomiting Anesthetic complications: no    Last Vitals:  Vitals:   12/24/17 0603 12/24/17 1455  BP: 134/76 108/63  Pulse: 83 75  Resp: 18 14  Temp: 36.7 C (!) 36.2 C  SpO2: 100% 96%    Last Pain:  Vitals:   12/24/17 1455  TempSrc:   PainSc: 7         RLE Motor Response: Purposeful movement;Responds to commands (12/24/17 1455) RLE Sensation: Decreased(due to neuropathy) (12/24/17 1455)      [place] Dowd,E. Keatyn Jawad

## 2017-12-24 NOTE — Anesthesia Procedure Notes (Signed)
Procedure Name: LMA Insertion Date/Time: 12/24/2017 1:42 PM Performed by: Barrington Ellison, CRNA Pre-anesthesia Checklist: Patient identified, Emergency Drugs available, Suction available and Patient being monitored Patient Re-evaluated:Patient Re-evaluated prior to induction Oxygen Delivery Method: Circle System Utilized Preoxygenation: Pre-oxygenation with 100% oxygen Induction Type: IV induction Ventilation: Mask ventilation without difficulty LMA: LMA inserted LMA Size: 5.0 Number of attempts: 1 Placement Confirmation: positive ETCO2 Tube secured with: Tape Dental Injury: Teeth and Oropharynx as per pre-operative assessment

## 2017-12-24 NOTE — Op Note (Signed)
12/24/2017  2:57 PM  PATIENT:  [name]    PRE-OPERATIVE DIAGNOSIS:  Abscess, Osteomyelitis Left Below Knee Amputation and Osteomyelitis Right 5th Metatarsal Head  POST-OPERATIVE DIAGNOSIS:  Same  PROCEDURE:  LEFT ABOVE KNEE AMPUTATION, RIGHT 5TH RAY AMPUTATION Application of Praveena customizable and Praveena restore for the left lower extremity.  Use of the Praveena customizable on the right lower extremity  SURGEON:  [name], MD  PHYSICIAN ASSISTANT:None ANESTHESIA:   General  PREOPERATIVE INDICATIONS:  MICHAE GRIMLEY is a  67 y.o. male with a diagnosis of Abscess, Osteomyelitis Left Below Knee Amputation and Osteomyelitis Right 5th Metatarsal Head who failed conservative measures and elected for surgical management.    The risks benefits and alternatives were discussed with the patient preoperatively including but not limited to the risks of infection, bleeding, nerve injury, cardiopulmonary complications, the need for revision surgery, among others, and the patient was willing to proceed.  OPERATIVE IMPLANTS: Wound vacs x2  @ENCIMAGES @  OPERATIVE FINDINGS: Good petechial bleeding at the surgical incisions.  OPERATIVE PROCEDURE: Patient was brought to the operating room underwent a general anesthetic.  After adequate levels anesthesia were obtained both lower extremities were prepped using DuraPrep draped in the sterile field a timeout was called.  The abscess and ulcerative tissue of the left below-knee amputation was draped out of the sterile field with impervious stockinette.  Attention was first focused on the left lower extremity.  A fishmouth incision was made through the distal thigh this was carried sharply down to bone and the femur was resected.  The neurovascular bundles medially were clamped and suture ligated with 2-0 silk.  The remainder of the leg was amputated with completion of the fishmouth incision.  Electrocautery was used for hemostasis.  The deep  superficial fascia layers and skin were closed with 2-0 nylon.  A Praveena customizable and Praveena restore were applied this had a good suction fit.  Attention was then focused on the right lower extremity.  A racquet incision was made around the ulcerative tissue and the fifth ray was resected in one block of tissue.  Electrocautery was used for hemostasis the wound was irrigated with normal saline.  The skin incision was closed with 2-0 nylon.  A Praveena wound VAC was applied this had a good suction fit patient was extubated taken the PACU in stable condition.   DISCHARGE PLANNING:  Antibiotic duration: Continue IV antibiotics for at least 24 hours postoperatively  Weightbearing: Nonweightbearing bilateral lower extremities  Pain medication: Opioid pathway ordered  Dressing care/ Wound VAC: Continue wound vacs for 1 week  Ambulatory devices: Transfer training only  Discharge to: Anticipate discharge to skilled nursing.  Follow-up: In the office 1 week post operative.

## 2017-12-24 NOTE — Plan of Care (Signed)
  Problem: Education: Goal: Knowledge of General Education information will improve Description: Including pain rating scale, medication(s)/side effects and non-pharmacologic comfort measures Outcome: Progressing   Problem: Nutrition: Goal: Adequate nutrition will be maintained Outcome: Progressing   Problem: Elimination: Goal: Will not experience complications related to bowel motility Outcome: Progressing Goal: Will not experience complications related to urinary retention Outcome: Progressing   

## 2017-12-24 NOTE — Consult Note (Signed)
            [facility] CM Primary Care Navigator  12/24/2017  [name] 14-Dec-1950 [id number]   Went to see patient at the bedside to identify possible discharge needs butheis off the unit in the OR at this time. (left above the knee amputation, right 5th ray amputation).  Per MD note, patient presented with large purulent draining abscess to left transtibial amputation with necrotic ulcer fifth metatarsal head right foot.  Will attempt to see patient at another time whenheis available in the room.     Addendum: (12/25/17):  Went back to seepatientat the bedside to identify possible discharge needs.  He reports having "increased drainage, pain, redness and swelling to left stump and right fifth toe" which had led to this admission/ surgery. (sepsis from possible infection of left transtibial amputation and necrotic right 5th toe, underwent left above the knee amputation, right 5th ray amputation)  Patient endorses Dr.[name] with [facility] as theprimary care provider.   [facility] [place] [facility] [place] [facility]pres[facility]) to obtain medications without difficulty.  Patientstatesthat he has been managing his medications at homestraight out of the containers.  Patient verbalized that fiancee [name]), daughter [name]) or close friend [name]) will be able to providetransportation to his doctors' appointments after discharge.   Patient verbalized thatfiancee who lives with him for 11 years will be his primary caregiver at home.  Anticipated discharge plan is skilled nursing facility(SNF- in process)for rehabilitation pertherapy recommendation.  Patient voiced understanding to call primary care provider's officewhenhereturns back home,for a post discharge follow-up visitwithin1- 2weeksor sooner if needed.Patient letter (with PCP's contact number) was provided asa  reminder.  Explained topatient about[facility] CM services available for health management andresourcesat homebut he indicated not needing services at this point.Patientreports that hehasbeen aware and knowledgeable of ways in managing health conditions and has done it for years (mainly DM with A1c of 6.4). Patientexpressedunderstandingto seekreferral from primary care provider to [facility] care management ifdeemed necessary and appropriatefor any servicesin the near future- when hereturns to home.  [facility] care management information was provided for future needs thathemay have.  Primary care provider's office is listed as providing transition of care (TOC) follow-up.   For additional questions please contact:  [name], BSN, RN-BC [facility] PRIMARY CARE Navigator Cell: [phone]

## 2017-12-24 NOTE — Anesthesia Preprocedure Evaluation (Addendum)
Anesthesia Evaluation  Patient identified by MRN, date of birth, ID band Patient awake    Reviewed: Allergy & Precautions, NPO status , Patient's Chart, lab work & pertinent test results, reviewed documented beta blocker date and time   History of Anesthesia Complications Negative for: history of anesthetic complications  Airway Mallampati: II  TM Distance: >3 FB Neck ROM: Full    Dental  (+) Poor Dentition, Chipped, Missing, Dental Advisory Given   Pulmonary sleep apnea and Continuous Positive Airway Pressure Ventilation , former smoker (quit 2003),    breath sounds clear to auscultation       Cardiovascular hypertension, Pt. on medications and Pt. on home beta blockers (-) angina+ CAD, + CABG and + Peripheral Vascular Disease   Rhythm:Regular Rate:Normal  '07 stress: EF 57%, fixed defect, overall normal LVF   Neuro/Psych negative neurological ROS     GI/Hepatic Neg liver ROS, GERD  Controlled,  Endo/Other  diabetes (glu 168), Insulin DependentMorbid obesity  Renal/GU      Musculoskeletal   Abdominal (+) + obese,   Peds  Hematology negative hematology ROS (+)   Anesthesia Other Findings Prostate cancer  Reproductive/Obstetrics                            Anesthesia Physical Anesthesia Plan  ASA: III  Anesthesia Plan: General   Post-op Pain Management:    Induction: Intravenous  PONV Risk Score and Plan: 3 and Dexamethasone, Ondansetron and Treatment may vary due to age or medical condition  Airway Management Planned: LMA  Additional Equipment:   Intra-op Plan:   Post-operative Plan:   Informed Consent: I have reviewed the patients History and Physical, chart, labs and discussed the procedure including the risks, benefits and alternatives for the proposed anesthesia with the patient or authorized representative who has indicated his/her understanding and acceptance.   Dental  advisory given  Plan Discussed with: CRNA and Surgeon  Anesthesia Plan Comments: (Plan routine monitors GA- LMA OK)       Anesthesia Quick Evaluation

## 2017-12-24 NOTE — Interval H&P Note (Signed)
History and Physical Interval Note:  12/24/2017 12:47 PM  [name]  has presented today for surgery, with the diagnosis of Abscess, Osteomyelitis Left Below Knee Amputation and Osteomyelitis Right 5th Metatarsal Head  The various methods of treatment have been discussed with the patient and family. After consideration of risks, benefits and other options for treatment, the patient has consented to  Procedure(s): LEFT ABOVE KNEE AMPUTATION (Left) RIGHT 5TH RAY AMPUTATION (Right) as a surgical intervention .  The patient's history has been reviewed, patient examined, no change in status, stable for surgery.  I have reviewed the patient's chart and labs.  Questions were answered to the patient's satisfaction.     [name]

## 2017-12-24 NOTE — Progress Notes (Signed)
[facility] PROGRESS NOTE  [name] CHE:[id number] DOB: Mar 25, 1950 DOA: 12/20/2017 PCP: [name], MD   Narrative: 47 male Known DM Ty ii, htn, L BKA Prior L BKA with Osteomyelitis Presented with Large purulent draining abscess of the L transtibial amputation and a necrotic R 5th metatarsal head Patient was seen by Dr. [name] and it was felt that patient would need AKA on L side   A & Plan Sepsis and possible infection L transtibial AKA + infection of R 5th ray amputation-- Surgery went to the continue Vanco Zosyn and on-call to or continue cefazolin-may be able to continue Trental for what it is worth for peripheral arterial disease Diabetes mellitus type 2-usually takes 60 units of Lantus every afternoon okay to give lower dose of 12 units and continue sliding scale-he is n.p.o. sugars are ranging from 1 66-2 21 would resume metformin 1000 in a.m. OHSS on CPAP-continue CPAP nightly he has his own machine HTN-continue Coreg 25 twice daily-Hyzaar 50/12.5 is on hold Probable BPH continue Flomax 0.4 BMI 36    DVT prophylaxis: Lovenox code Status: Presumed full code family Communication: Discussed with wife disposition Plan: Inpatient pending resolution   Verlon Au, MD  [facility] Direct contact: [phone] --Via amion app OR  --[email or web address]; password [email or web address]  7PM-7AM contact night coverage as above 12/24/2017, 8:09 AM  LOS: 4 days   Consultants:  Orthopedics  Procedures:  None  Antimicrobials:  Vancomycin and Zosyn  Interval history/Subjective: Awake alert not in any pain does not feel his right leg N.p.o. for procedure no other complaints  Objective:  Vitals:  Vitals:   12/23/17 1948 12/24/17 0603  BP: 139/70 134/76  Pulse: 87 83  Resp: 18 18  Temp: 97.8 F (36.6 C) 98 F (36.7 C)  SpO2: 96% 100%    Exam:  Awake alert pleasant no distress EOMI thick neck Chest clinically clear no added sound no rales no rhonchi S1-S2 no murmur  rub or gallop Right lower extremity has a bandage as does left BKA-he is not able to feel cold temperature   I have personally reviewed the following:  DATA   Labs:  White count is 12 hemoglobin is 12  Imaging studies:  MRI confirms 7.2 cm soft abscess of the BKA stump draining into skin anteriorly?  Early osteo-  Scheduled Meds: . aspirin  325 mg Oral Daily  . carvedilol  25 mg Oral BID WC  . chlorhexidine  60 mL Topical Once  . Chlorhexidine Gluconate Cloth  6 each Topical Daily  . enoxaparin (LOVENOX) injection  40 mg Subcutaneous Q24H  . insulin aspart  0-15 Units Subcutaneous TID WC  . insulin glargine  30 Units Subcutaneous Daily  . mupirocin ointment  1 application Topical Daily  . mupirocin ointment  1 application Nasal BID  . sodium chloride flush  3 mL Intravenous Q12H  . tamsulosin  0.4 mg Oral Daily   Continuous Infusions: . sodium chloride    .  ceFAZolin (ANCEF) IV    . piperacillin-tazobactam (ZOSYN)  IV 3.375 g (12/24/17 0011)  . vancomycin 1,000 mg (12/24/17 0345)    Principal Problem:   Cellulitis of extremity Active Problems:   Hyperlipemia   Coronary atherosclerosis of native coronary artery   Venous (peripheral) insufficiency   Osteoarthritis, multiple sites   Obstructive sleep apnea   Type 2 diabetes mellitus with diabetic polyneuropathy, with long-term current use of insulin (HCC)   Left below-knee amputee ([place])   PAD (peripheral artery disease) ([place])  Chronic renal disease, stage III (HCC)   Cellulitis and abscess of left lower extremity   Subacute osteomyelitis, right ankle and foot (HCC)   LOS: 4 days

## 2017-12-24 NOTE — Transfer of Care (Signed)
Immediate Anesthesia Transfer of Care Note  Patient: [name]  Procedure(s) Performed: LEFT ABOVE KNEE AMPUTATION (Left ) RIGHT 5TH RAY AMPUTATION (Right )  Patient Location: PACU  Anesthesia Type:General  Level of Consciousness: awake, alert  and oriented  Airway & Oxygen Therapy: Patient Spontanous Breathing  Post-op Assessment: Report given to RN  Post vital signs: Reviewed and stable  Last Vitals:  Vitals Value Taken Time  BP 108/63 12/24/2017  2:54 PM  Temp    Pulse 76 12/24/2017  2:57 PM  Resp 9 12/24/2017  2:57 PM  SpO2 92 % 12/24/2017  2:57 PM  Vitals shown include unvalidated device data.  Last Pain:  Vitals:   12/24/17 0603  TempSrc: Oral  PainSc:          Complications: No apparent anesthesia complications

## 2017-12-24 NOTE — Progress Notes (Signed)
Inpatient Diabetes Program Recommendations  AACE/ADA: New Consensus Statement on Inpatient Glycemic Control (2019)  Target Ranges:  Prepandial:   less than 140 mg/dL      Peak postprandial:   less than 180 mg/dL (1-2 hours)      Critically ill patients:  140 - 180 mg/dL  Results for AYINDE, SWIM (MRN [id number]) as of 12/24/2017 10:30  Ref. Range 12/23/2017 06:12 12/23/2017 11:41 12/23/2017 16:27 12/23/2017 21:43 12/24/2017 06:21  Glucose-Capillary Latest Ref Range: 70 - 99 mg/dL 185 (H) 216 (H) 170 (H) 166 (H) 221 (H)    Review of Glycemic Control  Diabetes history: DM2 Outpatient Diabetes medications: Toujeo 60 units daily, Novolog 5 units TID with meals, Metformin 1000 mg BID Current orders for Inpatient glycemic control: Lantus 30 units daily, Novolog 0-15 units TID with meals  Inpatient Diabetes Program Recommendations:  Insulin - Basal: Noted Lantus 12 units given today since patient is NPO for surgery. Insulin-Correction: Please consider ordering Novolog 0-5 units QHS for bedtime correction. Insulin - Meal Coverage: Once diet is resumed, please consider ordering Novolog 3 units TID with meals for meal coverage if patient eats at least 50% of meals.  Thanks, Barnie Alderman, RN, MSN, CDE Diabetes Coordinator Inpatient Diabetes Program [phone] (Team Pager from 8am to 5pm)

## 2017-12-24 NOTE — Progress Notes (Signed)
MD on call, K. Schorr paged to be advised about pt BS being 354 with no HS coverage. Awaiting response.

## 2017-12-24 NOTE — Progress Notes (Signed)
Pharmacy Antibiotic Note  [name] is a 67 y.o. male admitted on 12/20/2017 with wound infection/abscess of the left foot stump.  He also has a necrotic 5th metatarsal.  Pharmacy has been consulted for vancomycin and Zosyn dosing. Patient was recently treated with doxycycline outpatient.   Renal function stable, afebrile, WBC WNL.    Noted plan for AKA today.   Plan: Continue vanc 1gm IV Q12H for AUC 400-550 using SCr 1.13 Continue Zosyn EID 3.375g IV Q8H Monitor renal fxn, abx LOT post surgery to decide on whether to check vanc levels   Height: 6\' 2"  (188 cm) Weight: 282 lb 6.6 oz (128.1 kg) IBW/kg (Calculated) : 82.2  Temp (24hrs), Avg:97.9 F (36.6 C), Min:97.8 F (36.6 C), Max:98 F (36.7 C)  Recent Labs  Lab 12/20/17 1014 12/20/17 1023 12/20/17 1811 12/21/17 0417 12/22/17 0346 12/24/17 0222  WBC 15.2*  --  14.3* 12.4* 12.4* 12.0*  CREATININE 1.22  --  1.16 1.13 1.12 1.15  LATICACIDVEN  --  1.36  --   --   --   --     Estimated Creatinine Clearance: 86.3 mL/min (by C-G formula based on SCr of 1.15 mg/dL).    Allergies  Allergen Reactions  . Atorvastatin Other (See Comments)    REACTION: myalgias  . Ezetimibe Other (See Comments)    Body ache  . Simvastatin Other (See Comments)    Body ache     Zosyn 12/14>> Vanc 12/14>> Doxy PTA x2 days  12/14 BCx - NGTD 12/14 HIV - negative 12/17 surgical PCR - SA   Joshlyn Beadle D. Mina Marble, PharmD, BCPS, [place] 12/24/2017, 9:03 AM

## 2017-12-24 NOTE — Progress Notes (Addendum)
Pt to short stay, report was given to [place]. NPO post midnight maint.   1550 Received pt from PACU, A&O x4. Wound vac dressings to right foot and left AKA dry and intact. Pain meds given as needed.

## 2017-12-25 ENCOUNTER — Encounter (HOSPITAL_COMMUNITY): Payer: Self-pay | Admitting: Orthopedic Surgery

## 2017-12-25 DIAGNOSIS — I872 Venous insufficiency (chronic) (peripheral): Secondary | ICD-10-CM

## 2017-12-25 DIAGNOSIS — G4733 Obstructive sleep apnea (adult) (pediatric): Secondary | ICD-10-CM

## 2017-12-25 DIAGNOSIS — I2510 Atherosclerotic heart disease of native coronary artery without angina pectoris: Secondary | ICD-10-CM

## 2017-12-25 DIAGNOSIS — Z89612 Acquired absence of left leg above knee: Secondary | ICD-10-CM

## 2017-12-25 DIAGNOSIS — M150 Primary generalized (osteo)arthritis: Secondary | ICD-10-CM

## 2017-12-25 DIAGNOSIS — Z89421 Acquired absence of other right toe(s): Secondary | ICD-10-CM

## 2017-12-25 DIAGNOSIS — N183 Chronic kidney disease, stage 3 (moderate): Secondary | ICD-10-CM

## 2017-12-25 LAB — BASIC METABOLIC PANEL
Anion gap: 7 (ref 5–15)
BUN: 21 mg/dL (ref 8–23)
CO2: 26 mmol/L (ref 22–32)
Calcium: 8.7 mg/dL — ABNORMAL LOW (ref 8.9–10.3)
Chloride: 103 mmol/L (ref 98–111)
Creatinine, Ser: 1.27 mg/dL — ABNORMAL HIGH (ref 0.61–1.24)
GFR calc Af Amer: >60 mL/min (ref >60)
GFR, EST NON AFRICAN AMERICAN: 57 mL/min — AB (ref >60)
GLUCOSE: 290 mg/dL — AB (ref 70–99)
Potassium: 5.1 mmol/L (ref 3.5–5.1)
Sodium: 136 mmol/L (ref 135–145)

## 2017-12-25 LAB — CBC WITH DIFFERENTIAL/PLATELET
Abs Immature Granulocytes: 1.07 10*3/uL — ABNORMAL HIGH (ref 0.00–0.07)
BASOS PCT: 1 %
Basophils Absolute: 0.1 10*3/uL (ref 0.0–0.1)
Eosinophils Absolute: 0 10*3/uL (ref 0.0–0.5)
Eosinophils Relative: 0 %
HCT: 36.5 % — ABNORMAL LOW (ref 39.0–52.0)
Hemoglobin: 11.6 g/dL — ABNORMAL LOW (ref 13.0–17.0)
Immature Granulocytes: 5 %
Lymphocytes Relative: 4 %
Lymphs Abs: 0.9 10*3/uL (ref 0.7–4.0)
MCH: 26.9 pg (ref 26.0–34.0)
MCHC: 31.8 g/dL (ref 30.0–36.0)
MCV: 84.5 fL (ref 80.0–100.0)
Monocytes Absolute: 0.7 10*3/uL (ref 0.1–1.0)
Monocytes Relative: 3 %
NEUTROS PCT: 87 %
Neutro Abs: 19.6 10*3/uL — ABNORMAL HIGH (ref 1.7–7.7)
PLATELETS: 323 10*3/uL (ref 150–400)
RBC: 4.32 MIL/uL (ref 4.22–5.81)
RDW: 15.9 % — ABNORMAL HIGH (ref 11.5–15.5)
WBC: 22.4 10*3/uL — ABNORMAL HIGH (ref 4.0–10.5)
nRBC: 0 % (ref 0.0–0.2)

## 2017-12-25 LAB — GLUCOSE, CAPILLARY
Glucose-Capillary: 210 mg/dL — ABNORMAL HIGH (ref 70–99)
Glucose-Capillary: 273 mg/dL — ABNORMAL HIGH (ref 70–99)
Glucose-Capillary: 311 mg/dL — ABNORMAL HIGH (ref 70–99)
Glucose-Capillary: 195 mg/dL — ABNORMAL HIGH (ref 70–99)

## 2017-12-25 LAB — CULTURE, BLOOD (ROUTINE X 2)
Culture: NO GROWTH
Culture: NO GROWTH

## 2017-12-25 MED ORDER — INSULIN GLARGINE 100 UNIT/ML ~~LOC~~ SOLN
42.0000 [IU] | Freq: Every day | SUBCUTANEOUS | Status: DC
  Administered 2017-12-26: 42 [IU] via SUBCUTANEOUS
  Filled 2017-12-25: qty 0.42

## 2017-12-25 MED ORDER — SALINE SPRAY 0.65 % NA SOLN
1.0000 | NASAL | Status: DC | PRN
  Filled 2017-12-25: qty 44

## 2017-12-25 NOTE — Evaluation (Signed)
Physical Therapy Evaluation Patient Details Name: [name] MRN: [id number] DOB: 05/20/1950 Today's Date: 12/25/2017   History of Present Illness  Pt is a 67 y/o male PMH including Osteoarthrosis, Type II or unspecified type diabetes mellitus with neurological manifestations, glaucoma, sleep apnea, and Unspecified venous (peripheral) insufficiency,  Cardiac catheterization (1998); Cataract extraction (Right, 2017). Pt presents with with the diagnosis of Abscess, Osteomyelitis Left Below Knee Amputation and Osteomyelitis Right 5th Metatarsal Head.  Pt is now s/p L AKA, R 5th ray amputation.    Clinical Impression  Patient admitted with the above listed diagnosis. Prior to admission, patient was Mod I with mobility and transfers, patient today requiring physical assist of Mod A +2 with bed mobility, and Min A +2 for A/P transfers with consistent cueing for sequencing with use of bed pads to complete successfully. PT to recommend CIR level therapies at discharge to progress safe and independent functional mobility prior to return home.     Follow Up Recommendations CIR    Equipment Recommendations  Other (comment)(defer)    Recommendations for Other Services OT consult(as ordered)     Precautions / Restrictions Precautions Precautions: Fall Restrictions Weight Bearing Restrictions: Yes RLE Weight Bearing: Non weight bearing LLE Weight Bearing: Non weight bearing      Mobility  Bed Mobility Overal bed mobility: Needs Assistance Bed Mobility: Supine to Sit     Supine to sit: Mod assist;+2 for physical assistance     General bed mobility comments: mod A for initial trunk elevation, assist +2 for turning in the bed to prepare for AP transfer, use of bed pad to assist  Transfers Overall transfer level: Needs assistance   Transfers: Comptroller transfers: Min assist;+2 physical assistance;+2 safety/equipment   General transfer  comment: use of bed pad to assist , Pt able to complete most of transfer with BUE and cues for hand placement - able to maintain NWB through BLE  Ambulation/Gait                Stairs            Wheelchair Mobility    Modified Rankin (Stroke Patients Only)       Balance Overall balance assessment: Needs assistance Sitting-balance support: Bilateral upper extremity supported Sitting balance-Leahy Scale: Fair Sitting balance - Comments: min guard for safety                                     Pertinent Vitals/Pain Pain Assessment: 0-10 Pain Score: 6  Pain Location: LLE Pain Descriptors / Indicators: Discomfort;Grimacing;Guarding;Sore;Throbbing Pain Intervention(s): Limited activity within patient's tolerance;Monitored during session;Repositioned    Home Living Family/patient expects to be discharged to:: Private residence Living Arrangements: Spouse/significant other Available Help at Discharge: Family;Available 24 hours/day Type of Home: House Home Access: Ramped entrance     Home Layout: One level Home Equipment: Walker - 2 wheels;Walker - 4 wheels;Shower seat;Grab bars - tub/shower;Hand held shower head;Wheelchair - Education officer, community - power;Adaptive equipment(manual WC is falling apart)      Prior Function Level of Independence: Independent with assistive device(s)         Comments: WC level      Hand Dominance   Dominant Hand: Right    Extremity/Trunk Assessment   Upper Extremity Assessment Upper Extremity Assessment: Defer to OT evaluation    Lower Extremity Assessment Lower Extremity Assessment: RLE deficits/detail;LLE deficits/detail  RLE Deficits / Details: NWB, ace wrap intact, wound vac intact RLE Sensation: history of peripheral neuropathy RLE Coordination: decreased gross motor LLE Deficits / Details: dressing and woud vac in place; high AKA - reports this as most painful    Cervical / Trunk Assessment Cervical /  Trunk Assessment: Normal  Communication   Communication: No difficulties  Cognition Arousal/Alertness: Awake/alert Behavior During Therapy: WFL for tasks assessed/performed Overall Cognitive Status: Within Functional Limits for tasks assessed                                        General Comments General comments (skin integrity, edema, etc.): patient motivated to participate - does have concerns regarding pain    Exercises     Assessment/Plan    PT Assessment Patient needs continued PT services  PT Problem List Decreased strength;Decreased activity tolerance;Decreased balance;Decreased mobility;Decreased knowledge of use of DME;Decreased safety awareness       PT Treatment Interventions DME instruction;Gait training;Functional mobility training;Therapeutic activities;Therapeutic exercise;Balance training;Patient/family education    PT Goals (Current goals can be found in the Care Plan section)  Acute Rehab PT Goals Patient Stated Goal: get back to traveling PT Goal Formulation: With patient Time For Goal Achievement: 01/08/18 Potential to Achieve Goals: Good    Frequency Min 3X/week   Barriers to discharge        Co-evaluation PT/OT/SLP Co-Evaluation/Treatment: Yes Reason for Co-Treatment: Complexity of the patient's impairments (multi-system involvement);For patient/therapist safety;To address functional/ADL transfers PT goals addressed during session: Mobility/safety with mobility;Balance;Strengthening/ROM OT goals addressed during session: ADL's and self-care;Strengthening/ROM       AM-PAC PT "6 Clicks" Mobility  Outcome Measure Help needed turning from your back to your side while in a flat bed without using bedrails?: A Lot Help needed moving from lying on your back to sitting on the side of a flat bed without using bedrails?: A Lot Help needed moving to and from a bed to a chair (including a wheelchair)?: A Lot Help needed standing up from a  chair using your arms (e.g., wheelchair or bedside chair)?: Total Help needed to walk in hospital room?: Total Help needed climbing 3-5 steps with a railing? : Total 6 Click Score: 9    End of Session   Activity Tolerance: Patient tolerated treatment well Patient left: in chair;with call bell/phone within reach;with chair alarm set Nurse Communication: Mobility status PT Visit Diagnosis: Unsteadiness on feet (R26.81);Other abnormalities of gait and mobility (R26.89);Muscle weakness (generalized) (M62.81)    Time: 2641-5830 PT Time Calculation (min) (ACUTE ONLY): 35 min   Charges:   PT Evaluation $PT Eval Moderate Complexity: 1 Mod          Lanney Gins, PT, DPT Supplemental Physical Therapist 12/25/17 1:21 PM Pager: [phone] Office: [phone]

## 2017-12-25 NOTE — Progress Notes (Signed)
[facility] PROGRESS NOTE  [name] RSW:[id number] DOB: 03/01/1950 DOA: 12/20/2017 PCP: [name], MD   Narrative: 48 male Known DM Ty ii, htn, L BKA Prior L BKA with Osteomyelitis Presented with Large purulent draining abscess of the L transtibial amputation and a necrotic R 5th metatarsal head Patient was seen by Dr. [name] and it was felt that patient would need AKA on L side  Surgery performed on 12/18  A & Plan Sepsis and possible infection L transtibial AKA + infection of R 5th ray amputation-- continue Vanco Zosyn x 24 hr more continue Trental for what it is worth for peripheral arterial disease Leukocytosis-increased overnight-observe and continue peri-op ABx and d/c likely tomorrow as long as no growth on cutlures Diabetes mellitus type 2-usually takes 60 units lantus-increased today lantus to 42 U  continue sliding scale- metformin 1000 in a.m. OHSS on CPAP-continue CPAP nightly he has his own machine HTN-continue Coreg 25 twice daily-Hyzaar 50/12.5 is on hold [place] AKI-SLight increase in creatinine to 1.27--recommended patient force fluids Probable BPH continue Flomax 0.4 BMI 36    DVT prophylaxis: Lovenox code Status: Presumed full code family Communication: Discussed with wife disposition Plan: Inpatient pending resolution   Verlon Au, MD  [facility] Direct contact: [phone] --Via amion app OR  --[email or web address]; password [email or web address]  7PM-7AM contact night coverage as above 12/25/2017, 12:58 PM  LOS: 5 days   Consultants:  Orthopedics  Procedures:  None  Antimicrobials:  Vancomycin and Zosyn  Interval history/Subjective:  Pleasant no distress eating and drinking in nad No fever no chills no n/v Pain is controlled   Objective:  Vitals:  Vitals:   12/25/17 0525 12/25/17 0913  BP: 118/67 118/63  Pulse: 81 84  Resp: 19 18  Temp: 98.2 F (36.8 C) 98 F (36.7 C)  SpO2: 96%     Exam:  Awake alert pleasant no distress EOMI  thick neck Chest clinically clear no added sound no rales no rhonchi S1-S2 no murmur rub or gallop Des have bandage bilateral bandages   I have personally reviewed the following:  DATA   Labs:  White count is 12-->22.4 hemoglobin is 12-->11.6  CBG tredn in 240 ranges  Imaging studies:  MRI confirms 7.2 cm soft abscess of the BKA stump draining into skin anteriorly?  Early osteo-  Scheduled Meds: . aspirin  325 mg Oral Daily  . carvedilol  25 mg Oral BID WC  . Chlorhexidine Gluconate Cloth  6 each Topical Daily  . docusate sodium  100 mg Oral BID  . enoxaparin (LOVENOX) injection  40 mg Subcutaneous Q24H  . insulin aspart  0-15 Units Subcutaneous TID WC  . insulin glargine  30 Units Subcutaneous Daily  . mupirocin ointment  1 application Topical Daily  . mupirocin ointment  1 application Nasal BID  . sodium chloride flush  3 mL Intravenous Q12H  . tamsulosin  0.4 mg Oral Daily   Continuous Infusions: . sodium chloride    . sodium chloride 10 mL/hr at 12/24/17 1651  . methocarbamol (ROBAXIN) IV    . piperacillin-tazobactam (ZOSYN)  IV 3.375 g (12/25/17 0807)  . vancomycin 1,000 mg (12/25/17 0435)    Principal Problem:   Cellulitis of extremity Active Problems:   Hyperlipemia   Coronary atherosclerosis of native coronary artery   Venous (peripheral) insufficiency   Osteoarthritis, multiple sites   Obstructive sleep apnea   Type 2 diabetes mellitus with diabetic polyneuropathy, with long-term current use of insulin (HCC)   Left below-knee  amputee [facility])   PAD (peripheral artery disease) (HCC)   Chronic renal disease, stage III (HCC)   Cellulitis and abscess of left lower extremity   Subacute osteomyelitis, right ankle and foot ([place])   LOS: 5 days

## 2017-12-25 NOTE — Consult Note (Signed)
Physical Medicine and Rehabilitation Consult Reason for Consult: Decreased functional mobility Referring Physician: [facility]   HPI: [name] is a 67 y.o.right handed male with history of hypertension, diabetes mellitus, left BKA 2014 of which at that time was discharged directly home. Per chart review lives with girlfriend. One level home with ramped entrance.patient with assistance as needed. Patient initially with prosthesis after left BKA but later decreased wearing time due to ulceration of left stump. Presented 12/22/2017 with osteomyelitis of left BKA as well as findings of osteomyelitis right fifth metatarsal. Underwent left AKA, right fifth ray amputation 12/24/2017 per Dr. [name]. Hospital course pain management.Bilateral wound vacs applied. Leukocytosis 22,400. Subcutaneous Lovenox for DVT prophylaxis. Therapy evaluation completed with recommendations of physical medicine rehabilitation consult.    Review of Systems  Constitutional: Negative for chills and fever.  HENT: Negative for hearing loss.   Eyes: Negative for blurred vision and double vision.  Respiratory: Negative for cough and shortness of breath.   Cardiovascular: Positive for leg swelling. Negative for chest pain and palpitations.  Gastrointestinal: Positive for constipation. Negative for nausea and vomiting.  Genitourinary: Positive for urgency. Negative for dysuria, flank pain and hematuria.  Musculoskeletal: Positive for joint pain and myalgias.  Skin: Negative for rash.  All other systems reviewed and are negative.  Past Medical History:  Diagnosis Date  . Coronary atherosclerosis of unspecified type of vessel, native or graft   . Diabetes mellitus without complication ([place])   . Hemorrhage of rectum and anus   . History of kidney stones   . Impotence of organic origin   . Internal hemorrhoids without mention of complication   . Malignant neoplasm of prostate ([place])   . Mononeuritis of unspecified site    . Osteoarthrosis, unspecified whether generalized or localized, unspecified site   . Other and unspecified hyperlipidemia   . Personal history of colonic polyps   . Personal history of diabetic foot ulcer    saw wound center, resolved 05/08/2010  . Personal history of gallstones   . Routine general medical examination at a health care facility   . Type II or unspecified type diabetes mellitus with neurological manifestations, not stated as uncontrolled(250.60)   . Unspecified glaucoma(365.9)   . Unspecified sleep apnea    cpcp  . Unspecified venous (peripheral) insufficiency    Past Surgical History:  Procedure Laterality Date  . AMPUTATION Left 12/30/2012   Procedure: AMPUTATION RAY ;  Surgeon: Meredith Pel, MD;  Location: [facility];  Service: Orthopedics;  Laterality: Left;  LEFT GREAT TOE RAY AMPUTATION  . AMPUTATION Left 02/23/2016   Procedure: Left Below Knee Amputation;  Surgeon: [name], MD;  Location: [facility];  Service: Orthopedics;  Laterality: Left;  . AMPUTATION Left 12/24/2017   Procedure: LEFT ABOVE KNEE AMPUTATION;  Surgeon: [name], MD;  Location: [facility];  Service: Orthopedics;  Laterality: Left;  . AMPUTATION Right 12/24/2017   Procedure: RIGHT 5TH RAY AMPUTATION;  Surgeon: [name], MD;  Location: [place] [facility];  Service: Orthopedics;  Laterality: Right;  . APPENDECTOMY    . BASAL CELL CARCINOMA EXCISION  2/16   left forearm  . CARDIAC CATHETERIZATION  1998   Negative  . CATARACT EXTRACTION Right 2017   then lid surgery  . CORONARY ARTERY BYPASS GRAFT  09/2005   Post op AFIB  . EYE SURGERY    . FOOT BONE EXCISION Left 11/03/2013   DR DUDA   . I&D EXTREMITY Left  11/03/2013   Procedure: Left Foot Partial Bone Excision Cuboid and Medial Cuneiform, Wound Closures;  Surgeon: [name], MD;  Location: [facility];  Service: Orthopedics;  Laterality: Left;  . INSERTION PROSTATE RADIATION SEED  2009   RT and seeds for prostate cancer  . KIDNEY STONE SURGERY   04/1993  . RCA stents  04/2003   EF 55%  . RETINAL DETACHMENT SURGERY  2002-2003  . thrombosed vein  1993   Right leg   Family History  Problem Relation Age of Onset  . Lung cancer Father   . Multiple sclerosis Mother   . Heart attack Other        paternal aunts and uncles  . Peripheral vascular disease Maternal Grandfather        several amputations   Social History:  reports that he quit smoking about 16 years ago. His smoking use included cigars and cigarettes. He has a 70.00 pack-year smoking history. He has never used smokeless tobacco. He reports current alcohol use. He reports that he does not use drugs. Allergies:  Allergies  Allergen Reactions  . Atorvastatin Other (See Comments)    REACTION: myalgias  . Ezetimibe Other (See Comments)    Body ache  . Simvastatin Other (See Comments)    Body ache   Medications Prior to Admission  Medication Sig Dispense Refill  . Amino Acids-Protein Hydrolys (FEEDING SUPPLEMENT, PRO-STAT SUGAR FREE 64,) LIQD Take 30 mLs by mouth 3 (three) times daily with meals.    Marland Kitchen aspirin 325 MG tablet Take 325 mg by mouth daily.      . carvedilol (COREG) 25 MG tablet TAKE 1 TABLET TWICE DAILY (Patient taking differently: Take 25 mg by mouth 2 (two) times daily with a meal. ) 180 tablet 3  . doxycycline (VIBRAMYCIN) 100 MG capsule Take 1 capsule (100 mg total) by mouth 2 (two) times daily. (Patient taking differently: Take 100 mg by mouth 2 (two) times daily. 14 day supply) 20 capsule 0  . HYDROcodone-acetaminophen (NORCO) 5-325 MG tablet Take 1 tablet by mouth every 4 (four) hours as needed for moderate pain. 10 tablet 0  . insulin aspart (NOVOLOG FLEXPEN) 100 UNIT/ML FlexPen INJECT&nbsp;&nbsp;30&nbsp;&nbsp;TO&nbsp;&nbsp;35 UNITS SUBCUTANEOUSLY THREE TIMES DAILY WITH MEALS (Patient taking differently: Inject 35 Units into the skin 3 (three) times daily with meals. ) 90 mL 1  . Insulin Glargine (TOUJEO SOLOSTAR) 300 UNIT/ML SOPN Inject 60 Units into the  skin daily. 18 pen 3  . losartan-hydrochlorothiazide (HYZAAR) 50-12.5 MG tablet TAKE 1 TABLET EVERY DAY (Patient taking differently: Take 1 tablet by mouth daily. ) 90 tablet 3  . metFORMIN (GLUCOPHAGE) 1000 MG tablet Take 1 tablet (1,000 mg total) by mouth daily. (Patient taking differently: Take 1,000 mg by mouth every evening. ) 90 tablet 3  . mupirocin ointment (BACTROBAN) 2 % Apply to wound daily (Patient taking differently: Apply 1 application topically daily as needed (wound healing). Apply to wound daily) 22 g 0  . nitroGLYCERIN (NITRODUR - DOSED IN MG/24 HR) 0.2 mg/hr patch PLACE 1 PATCH (0.2 MG TOTAL) DAILY ONTO THE SKIN. (Patient taking differently: Place 0.2 mg onto the skin daily. On left leg) 90 patch 4  . potassium citrate (UROCIT-K) 10 MEQ (1080 MG) SR tablet Take 20 mEq by mouth 2 (two) times daily.   12  . Probiotic Product (PROBIOTIC DAILY PO) Take 1 capsule by mouth daily.    . tamsulosin (FLOMAX) 0.4 MG CAPS capsule Take 1 capsule (0.4 mg total) by mouth daily.  30 capsule 0  . triamcinolone (KENALOG) 0.025 % cream APPLY 1 APPLICATION TOPICALLY 3 (THREE) TIMES DAILY AS NEEDED. (Patient taking differently: Apply 1 application topically 3 (three) times daily as needed (rash on back). ) 30 g 3  . vitamin C (ASCORBIC ACID) 500 MG tablet Take 500 mg by mouth 2 (two) times daily.    Marland Kitchen zinc gluconate 50 MG tablet Take 50 mg by mouth daily.    Marland Kitchen ACCU-CHEK FASTCLIX LANCETS MISC Use to check sugar 3 times daily dx- E11.42 300 each 5  . Blood Glucose Monitoring Suppl (ACCU-CHEK GUIDE) w/Device KIT 1 kit by Other route 3 (three) times daily. Use to check blood sugars 3 times daily, Dx Code E11.42 1 kit 1  . DROPLET PEN NEEDLES 31G X 5 MM MISC USE TO INJECT INSULIN FOUR TIMES DAILY AS INSTRUCTED (FOR DIABETES) 400 each 0  . glucose blood (ACCU-CHEK GUIDE) test strip Use as instructed to check sugar 3 times daily dx- E11.42 300 each 5  . ondansetron (ZOFRAN) 4 MG tablet Take 1 tablet (4 mg  total) by mouth every 6 (six) hours as needed for nausea or vomiting. (Patient not taking: Reported on 12/20/2017) 12 tablet 0  . pentoxifylline (TRENTAL) 400 MG CR tablet Take 1 tablet (400 mg total) by mouth 3 (three) times daily with meals. (Patient not taking: Reported on 12/20/2017) 90 tablet 3    Home: [place] expects to be discharged to:: Private residence Living Arrangements: Spouse/significant other Available Help at Discharge: Family, Available 24 hours/day Type of Home: House Home Access: [place]: One level Bathroom Shower/Tub: Multimedia programmer: Handicapped height Bathroom Accessibility: Yes Home Equipment: Environmental consultant - 2 wheels, Environmental consultant - 4 wheels, Shower seat, Grab bars - tub/shower, Hand held shower head, Wheelchair - manual, Wheelchair - power, Adaptive equipment(manual WC is falling apart) Adaptive Equipment: Reacher, Long-handled sponge  Functional History: Prior Function Level of Independence: Independent with assistive device(s) Comments: WC level  Functional Status:  Mobility: Bed Mobility Overal bed mobility: Needs Assistance Bed Mobility: Supine to Sit Supine to sit: Mod assist, +2 for physical assistance General bed mobility comments: mod A for initial trunk elevation, assist +2 for turning in the bed to prepare for AP transfer, use of bed pad to assist Transfers Overall transfer level: Needs assistance Transfers: Government social research officer transfers: Min assist, +2 physical assistance, +2 safety/equipment General transfer comment: use of bed pad to assist , Pt able to complete most of transfer with BUE and cues for hand placement - able to maintain NWB through BLE      ADL: ADL Overall ADL's : Needs assistance/impaired Eating/Feeding: Independent Grooming: Oral care, Wash/dry face, Set up, Sitting Grooming Details (indicate cue type and reason): in recliner Upper Body Bathing: Set up,  Sitting Lower Body Bathing: Moderate assistance Upper Body Dressing : Set up Lower Body Dressing: Maximal assistance Toilet Transfer: Minimal assistance, +2 for physical assistance, +2 for safety/equipment, Anterior/posterior Toilet Transfer Details (indicate cue type and reason): simualted through recliner transfer, bed pad used to assist, 3rd person helpful to manage leg Toileting- Clothing Manipulation and Hygiene: Maximal assistance Functional mobility during ADLs: Minimal assistance, +2 for physical assistance, +2 for safety/equipment(AP transfers as Pt NWB through BLE)  Cognition: Cognition Overall Cognitive Status: Within Functional Limits for tasks assessed Orientation Level: Oriented X4 Cognition Arousal/Alertness: Awake/alert Behavior During Therapy: WFL for tasks assessed/performed Overall Cognitive Status: Within Functional Limits for tasks assessed  Blood pressure 116/60, pulse 84, temperature 98.2  F (36.8 C), temperature source Oral, resp. rate 16, height 6' 2.02" (1.88 m), weight 128.1 kg, SpO2 96 %. Physical Exam  Vitals reviewed. Constitutional: He is oriented to person, place, and time.  HENT:  Head: Normocephalic.  Eyes: EOM are normal.  Neck: Normal range of motion. Neck supple. No thyromegaly present.  Cardiovascular: Normal rate, regular rhythm and normal heart sounds.  Respiratory: Effort normal and breath sounds normal. No respiratory distress.  GI: Soft. Bowel sounds are normal. He exhibits no distension.  Neurological: He is alert and oriented to person, place, and time.  Skin:  Left AKA is dressed. Right foot dressing in place. Bilateral wound vacs in place. Appropriately tender.  Motor strength is 5/5 bilateral deltoid, bicep, tricep, grip 4/5 right hip flexor knee extensor 3- ankle dorsiflexor plantar flexor 3/5 left hip flexor left hip abduction and adduction Sensation absent to light touch below mid calf area on the right side.  Intact light touch  bilateral upper limbs but has some tingling Results for orders placed or performed during the hospital encounter of 12/20/17 (from the past 24 hour(s))  Glucose, capillary     Status: Abnormal   Collection Time: 12/24/17  2:57 PM  Result Value Ref Range   Glucose-Capillary 141 (H) 70 - 99 mg/dL   Comment 1 Notify RN   Glucose, capillary     Status: Abnormal   Collection Time: 12/24/17  3:53 PM  Result Value Ref Range   Glucose-Capillary 161 (H) 70 - 99 mg/dL  Glucose, capillary     Status: Abnormal   Collection Time: 12/24/17  9:27 PM  Result Value Ref Range   Glucose-Capillary 354 (H) 70 - 99 mg/dL   Comment 1 Notify RN    Comment 2 Document in Chart   CBC with Differential/Platelet     Status: Abnormal   Collection Time: 12/25/17  2:14 AM  Result Value Ref Range   WBC 22.4 (H) 4.0 - 10.5 K/uL   RBC 4.32 4.22 - 5.81 MIL/uL   Hemoglobin 11.6 (L) 13.0 - 17.0 g/dL   HCT 36.5 (L) 39.0 - 52.0 %   MCV 84.5 80.0 - 100.0 fL   MCH 26.9 26.0 - 34.0 pg   MCHC 31.8 30.0 - 36.0 g/dL   RDW 15.9 (H) 11.5 - 15.5 %   Platelets 323 150 - 400 K/uL   nRBC 0.0 0.0 - 0.2 %   Neutrophils Relative % 87 %   Neutro Abs 19.6 (H) 1.7 - 7.7 K/uL   Lymphocytes Relative 4 %   Lymphs Abs 0.9 0.7 - 4.0 K/uL   Monocytes Relative 3 %   Monocytes Absolute 0.7 0.1 - 1.0 K/uL   Eosinophils Relative 0 %   Eosinophils Absolute 0.0 0.0 - 0.5 K/uL   Basophils Relative 1 %   Basophils Absolute 0.1 0.0 - 0.1 K/uL   Immature Granulocytes 5 %   Abs Immature Granulocytes 1.07 (H) 0.00 - 0.07 K/uL  Basic metabolic panel     Status: Abnormal   Collection Time: 12/25/17  2:14 AM  Result Value Ref Range   Sodium 136 135 - 145 mmol/L   Potassium 5.1 3.5 - 5.1 mmol/L   Chloride 103 98 - 111 mmol/L   CO2 26 22 - 32 mmol/L   Glucose, Bld 290 (H) 70 - 99 mg/dL   BUN 21 8 - 23 mg/dL   Creatinine, Ser 1.27 (H) 0.61 - 1.24 mg/dL   Calcium 8.7 (L) 8.9 - 10.3 mg/dL  GFR calc non Af Amer 57 (L) >60 mL/min   GFR calc Af  Amer >60 >60 mL/min   Anion gap 7 5 - 15  Glucose, capillary     Status: Abnormal   Collection Time: 12/25/17  6:30 AM  Result Value Ref Range   Glucose-Capillary 210 (H) 70 - 99 mg/dL   Comment 1 Notify RN    Comment 2 Document in Chart   Glucose, capillary     Status: Abnormal   Collection Time: 12/25/17 11:30 AM  Result Value Ref Range   Glucose-Capillary 273 (H) 70 - 99 mg/dL   No results found.   Assessment/Plan: Diagnosis: Left above-knee amputation secondary to osteomyelitis, right fifth ray amputation secondary to osteomyelitis nonweightbearing bilateral lower limb 1. Does the need for close, 24 hr/day medical supervision in concert with the patient's rehab needs make it unreasonable for this patient to be served in a less intensive setting? Yes 2. Co-Morbidities requiring supervision/potential complications: Diabetes with peripheral neuropathy, hypertension, coronary artery disease status post CABG 3. Due to bladder management, bowel management, safety, skin/wound care, disease management, medication administration, pain management and patient education, does the patient require 24 hr/day rehab nursing? Yes 4. Does the patient require coordinated care of a physician, rehab nurse, PT (1-2 hrs/day, 5 days/week) and OT (1-2 hrs/day, 5 days/week) to address physical and functional deficits in the context of the above medical diagnosis(es)? Yes Addressing deficits in the following areas: balance, endurance, locomotion, strength, transferring, bowel/bladder control, bathing, dressing, feeding, grooming, toileting, cognition, speech, language, swallowing and psychosocial support 5. Can the patient actively participate in an intensive therapy program of at least 3 hrs of therapy per day at least 5 days per week? Yes 6. The potential for patient to make measurable gains while on inpatient rehab is good 7. Anticipated functional outcomes upon discharge from inpatient rehab are modified  independent  with PT, modified independent with OT, n/a with SLP. 8. Estimated rehab length of stay to reach the above functional goals is: 7-10d 9. Anticipated D/C setting: Home 10. Anticipated post D/C treatments: [place] therapy 11. Overall Rehab/Functional Prognosis: good  RECOMMENDATIONS: This patient's condition is appropriate for continued rehabilitative care in the following setting: CIR Patient has agreed to participate in recommended program. Yes Note that insurance prior authorization may be required for reimbursement for recommended care.  Comment:  "I have personally performed a face to face diagnostic evaluation of this patient.  Additionally, I have reviewed and concur with the physician assistant's documentation above." Charlett Blake M.D. [facility] FAAPM&R (Sports Med, Neuromuscular Med) Diplomate Am Board of Electrodiagnostic Med  Cathlyn Parsons, PA-C 12/25/2017

## 2017-12-25 NOTE — Evaluation (Addendum)
Occupational Therapy Evaluation Patient Details Name: [name] MRN: [id number] DOB: [birth year] Today's Date: 12/25/2017    History of Present Illness Pt is a 67 y/o male PMH including Osteoarthrosis, Type II or unspecified type diabetes mellitus with neurological manifestations, glaucoma, sleep apnea, and Unspecified venous (peripheral) insufficiency,  Cardiac catheterization (1998); Cataract extraction (Right, 2017). Pt presents with with the diagnosis of Abscess, Osteomyelitis Left Below Knee Amputation and Osteomyelitis Right 5th Metatarsal Head.  Pt is now s/p L AKA, R 5th ray amputation.   Clinical Impression   PTA Pt Mod I with DME and AE at [facility] level. Pt is currently mod A +2 for bed mobility - managing lines and trunk elevation, able to perform AP transfer to recliner this session with min A +2 bed pad being used as "bucket" to assist with transfer - vc for hand placement and sequencing. Then Pt able to perform grooming seated in recliner with set up. Pt will benefit from skilled OT in the acute setting as well as afterwards at CIR level to maximize safety and independence in ADL and functional transfers at wheelchair level (transfer training). Short stay at CIR level therapy would allow Pt to return to mod I level of function. Next session to focus on continued improvement with transfers [facility]) and peri care/hygiene with lateral leans.     Follow Up Recommendations   CIR; 24 Hour Supervision    Equipment Recommendations  Other (comment)(defer to next venue of care)    Recommendations for Other Services       Precautions / Restrictions Precautions Precautions: Fall Restrictions Weight Bearing Restrictions: Yes RLE Weight Bearing: Non weight bearing LLE Weight Bearing: Non weight bearing      Mobility Bed Mobility Overal bed mobility: Needs Assistance Bed Mobility: Supine to Sit     Supine to sit: Mod assist;+2 for physical assistance     General bed mobility  comments: mod A for initial trunk elevation, assist +2 for turning in the bed to prepare for AP transfer, use of bed pad to assist  Transfers Overall transfer level: Needs assistance   Transfers: Comptroller transfers: Min assist;+2 physical assistance;+2 safety/equipment   General transfer comment: use of bed pad to assist , Pt able to complete most of transfer with BUE and cues for hand placement - able to maintain NWB through BLE    Balance Overall balance assessment: Needs assistance Sitting-balance support: Bilateral upper extremity supported Sitting balance-Leahy Scale: Fair Sitting balance - Comments: min guard for safety                                   ADL either performed or assessed with clinical judgement   ADL Overall ADL's : Needs assistance/impaired Eating/Feeding: Independent   Grooming: Oral care;Wash/dry face;Set up;Sitting Grooming Details (indicate cue type and reason): in recliner Upper Body Bathing: Set up;Sitting   Lower Body Bathing: Moderate assistance   Upper Body Dressing : Set up   Lower Body Dressing: Maximal assistance   Toilet Transfer: Minimal assistance;+2 for physical assistance;+2 for safety/equipment;Anterior/posterior Armed forces technical officer Details (indicate cue type and reason): simualted through recliner transfer, bed pad used to assist, 3rd person helpful to manage leg Toileting- Clothing Manipulation and Hygiene: Maximal assistance       Functional mobility during ADLs: Minimal assistance;+2 for physical assistance;+2 for safety/equipment(AP transfers as Pt NWB through BLE)  Vision Baseline Vision/History: Wears glasses Wears Glasses: Reading only Patient Visual Report: No change from baseline       Perception     Praxis      Pertinent Vitals/Pain Pain Assessment: 0-10 Pain Score: 6  Pain Location: LLE Pain Descriptors / Indicators:  Discomfort;Grimacing;Guarding;Sore;Throbbing Pain Intervention(s): Monitored during session;Repositioned     Hand Dominance Right   Extremity/Trunk Assessment Upper Extremity Assessment Upper Extremity Assessment: Overall WFL for tasks assessed   Lower Extremity Assessment Lower Extremity Assessment: RLE deficits/detail;LLE deficits/detail RLE Deficits / Details: NWB, ace wrap in tact, wound vac in tact RLE Sensation: history of peripheral neuropathy RLE Coordination: decreased gross motor LLE Deficits / Details: dressing and woud vac in place   Cervical / Trunk Assessment Cervical / Trunk Assessment: Normal   Communication Communication Communication: No difficulties   Cognition Arousal/Alertness: Awake/alert Behavior During Therapy: WFL for tasks assessed/performed Overall Cognitive Status: Within Functional Limits for tasks assessed                                     General Comments       Exercises     Shoulder Instructions      Home Living Family/patient expects to be discharged to:: Private residence Living Arrangements: Spouse/significant other Available Help at Discharge: Family;Available 24 hours/day Type of Home: House Home Access: Ramped entrance     Home Layout: One level     Bathroom Shower/Tub: Occupational psychologist: Handicapped height Bathroom Accessibility: Yes How Accessible: Accessible via wheelchair Home Equipment: [place] - 2 wheels;Walker - 4 wheels;Shower seat;Grab bars - tub/shower;Hand held shower head;Wheelchair - Education officer, community - power;Adaptive equipment(manual WC is falling apart) Adaptive Equipment: Reacher;Long-handled sponge        Prior Functioning/Environment Level of Independence: Independent with assistive device(s)        Comments: WC level         OT Problem List: Decreased activity tolerance;Impaired balance (sitting and/or standing);Decreased knowledge of precautions;Obesity;Pain       OT Treatment/Interventions: Self-care/ADL training;Therapeutic exercise;DME and/or AE instruction;Therapeutic activities;Patient/family education;Balance training    OT Goals(Current goals can be found in the care plan section) Acute Rehab OT Goals Patient Stated Goal: get back to traveling OT Goal Formulation: With patient Time For Goal Achievement: 01/08/18 Potential to Achieve Goals: Good ADL Goals Pt Will Perform Lower Body Bathing: with set-up;with adaptive equipment;sitting/lateral leans Pt Will Perform Lower Body Dressing: with min assist;sitting/lateral leans;with adaptive equipment Pt Will Transfer to Toilet: with min guard assist;anterior/posterior transfer;bedside commode Pt Will Perform Toileting - Clothing Manipulation and hygiene: with min guard assist;sitting/lateral leans Additional ADL Goal #1: Pt will perform bed mobility at supervision level prior to engaging in ADL activity  OT Frequency: Min 2X/week   Barriers to D/C:            Co-evaluation PT/OT/SLP Co-Evaluation/Treatment: Yes Reason for Co-Treatment: Complexity of the patient's impairments (multi-system involvement);For patient/therapist safety;To address functional/ADL transfers PT goals addressed during session: Mobility/safety with mobility;Balance;Strengthening/ROM OT goals addressed during session: ADL's and self-care;Strengthening/ROM      AM-PAC OT "6 Clicks" Daily Activity     Outcome Measure Help from another person eating meals?: None Help from another person taking care of personal grooming?: None Help from another person toileting, which includes using toliet, bedpan, or urinal?: A Lot Help from another person bathing (including washing, rinsing, drying)?: A Lot Help from another person to put on  and taking off regular upper body clothing?: None Help from another person to put on and taking off regular lower body clothing?: A Lot 6 Click Score: 18   End of Session Nurse Communication:  Mobility status;Weight bearing status  Activity Tolerance: Patient tolerated treatment well Patient left: in chair;with call bell/phone within reach;with nursing/sitter in room  OT Visit Diagnosis: Pain;Other abnormalities of gait and mobility (R26.89) Pain - Right/Left: Left Pain - part of body: Leg                Time: 8315-1761 OT Time Calculation (min): 35 min Charges:  OT General Charges $OT Visit: 1 Visit OT Evaluation $OT Eval Moderate Complexity: [place] OTR/L Acute Rehabilitation Services Pager: [phone] Office: [place] 12/25/2017, 10:56 AM

## 2017-12-25 NOTE — Progress Notes (Signed)
Rehab Admissions Coordinator Note:  Patient was screened by [name] for appropriateness for an Inpatient Acute Rehab Consult per PT and OT recommendation.  At this time, we are recommending Inpatient Rehab consult. I will contact Dr. Verlon Au for order.  Danne Baxter, RN, MSN Rehab Admissions Coordinator [phone] 12/25/2017 1:55 PM

## 2017-12-25 NOTE — Progress Notes (Signed)
Patient ID: [name], male   DOB: 1950/08/25, 67 y.o.   MRN: [id number] Postoperative day 1 left above-the-knee amputation and right fifth ray amputation.  There is about 50 cc in the wound VAC canister from the above-knee amputation site.  Both wound vacs are functioning well.  Anticipate discharge to skilled nursing facility.  Continue IV antibiotics for 24 hours postoperatively.

## 2017-12-25 NOTE — Progress Notes (Signed)
Inpatient Diabetes Program Recommendations  AACE/ADA: New Consensus Statement on Inpatient Glycemic Control (2019)  Target Ranges:  Prepandial:   less than 140 mg/dL      Peak postprandial:   less than 180 mg/dL (1-2 hours)      Critically ill patients:  140 - 180 mg/dL   Results for [name], [name] (MRN [id number]) as of 12/25/2017 09:35  Ref. Range 12/24/2017 06:21 12/24/2017 10:50 12/24/2017 14:57 12/24/2017 15:53 12/24/2017 21:27 12/25/2017 06:30  Glucose-Capillary Latest Ref Range: 70 - 99 mg/dL 221 (H) 168 (H) 141 (H) 161 (H) 354 (H) 210 (H)   Review of Glycemic Control  Diabetes history: DM2 Outpatient Diabetes medications: Toujeo 60 units daily, Novolog 5 units TID with meals, Metformin 1000 mg BID Current orders for Inpatient glycemic control: Lantus 30 units daily, Novolog 0-15 units TID with meals  Inpatient Diabetes Program Recommendations:  Insulin - Basal: Noted Lantus 12 units given on 12/24/17 since patient was NPO for surgery. As a result, glucose up to 354 mg/dl on 12/18 and fasting 210 mg/dl this morning. Patient should receive Lantus 30 units today as ordered so anticipate glucose to improve.  Insulin-Correction: Please consider ordering Novolog 0-5 units QHS for bedtime correction.  Insulin - Meal Coverage: If post prandial glucose is consistently greater than 180 mg/dl, please consider ordering Novolog 3 units TID with meals for meal coverage if patient eats at least 50% of meals.  Thanks, Barnie Alderman, RN, MSN, CDE Diabetes Coordinator Inpatient Diabetes Program [phone] (Team Pager from 8am to 5pm)

## 2017-12-26 ENCOUNTER — Inpatient Hospital Stay (HOSPITAL_COMMUNITY)
Admission: RE | Admit: 2017-12-26 | Discharge: 2018-01-03 | DRG: 560 | Disposition: A | Discharge status: Home Health | Payer: Medicare Other | Source: Intra-hospital | Attending: Physical Medicine & Rehabilitation | Admitting: Physical Medicine & Rehabilitation

## 2017-12-26 ENCOUNTER — Other Ambulatory Visit: Payer: Self-pay

## 2017-12-26 ENCOUNTER — Encounter (HOSPITAL_COMMUNITY): Payer: Self-pay

## 2017-12-26 DIAGNOSIS — Z89421 Acquired absence of other right toe(s): Secondary | ICD-10-CM

## 2017-12-26 DIAGNOSIS — Z8546 Personal history of malignant neoplasm of prostate: Secondary | ICD-10-CM

## 2017-12-26 DIAGNOSIS — R7309 Other abnormal glucose: Secondary | ICD-10-CM | POA: Diagnosis not present

## 2017-12-26 DIAGNOSIS — K5901 Slow transit constipation: Secondary | ICD-10-CM | POA: Diagnosis not present

## 2017-12-26 DIAGNOSIS — Z820 Family history of epilepsy and other diseases of the nervous system: Secondary | ICD-10-CM | POA: Diagnosis not present

## 2017-12-26 DIAGNOSIS — Z6835 Body mass index (BMI) 35.0-35.9, adult: Secondary | ICD-10-CM

## 2017-12-26 DIAGNOSIS — Z79899 Other long term (current) drug therapy: Secondary | ICD-10-CM

## 2017-12-26 DIAGNOSIS — E7849 Other hyperlipidemia: Secondary | ICD-10-CM | POA: Diagnosis present

## 2017-12-26 DIAGNOSIS — S78112A Complete traumatic amputation at level between left hip and knee, initial encounter: Secondary | ICD-10-CM | POA: Diagnosis not present

## 2017-12-26 DIAGNOSIS — K5900 Constipation, unspecified: Secondary | ICD-10-CM | POA: Diagnosis present

## 2017-12-26 DIAGNOSIS — I2510 Atherosclerotic heart disease of native coronary artery without angina pectoris: Secondary | ICD-10-CM | POA: Diagnosis present

## 2017-12-26 DIAGNOSIS — E1149 Type 2 diabetes mellitus with other diabetic neurological complication: Secondary | ICD-10-CM | POA: Diagnosis present

## 2017-12-26 DIAGNOSIS — G4730 Sleep apnea, unspecified: Secondary | ICD-10-CM | POA: Diagnosis present

## 2017-12-26 DIAGNOSIS — D62 Acute posthemorrhagic anemia: Secondary | ICD-10-CM | POA: Diagnosis present

## 2017-12-26 DIAGNOSIS — Z801 Family history of malignant neoplasm of trachea, bronchus and lung: Secondary | ICD-10-CM

## 2017-12-26 DIAGNOSIS — I10 Essential (primary) hypertension: Secondary | ICD-10-CM | POA: Diagnosis present

## 2017-12-26 DIAGNOSIS — E1142 Type 2 diabetes mellitus with diabetic polyneuropathy: Secondary | ICD-10-CM

## 2017-12-26 DIAGNOSIS — Z85828 Personal history of other malignant neoplasm of skin: Secondary | ICD-10-CM | POA: Diagnosis not present

## 2017-12-26 DIAGNOSIS — Z8719 Personal history of other diseases of the digestive system: Secondary | ICD-10-CM

## 2017-12-26 DIAGNOSIS — E1159 Type 2 diabetes mellitus with other circulatory complications: Secondary | ICD-10-CM | POA: Diagnosis not present

## 2017-12-26 DIAGNOSIS — Z888 Allergy status to other drugs, medicaments and biological substances status: Secondary | ICD-10-CM

## 2017-12-26 DIAGNOSIS — Z8249 Family history of ischemic heart disease and other diseases of the circulatory system: Secondary | ICD-10-CM | POA: Diagnosis not present

## 2017-12-26 DIAGNOSIS — Z4781 Encounter for orthopedic aftercare following surgical amputation: Principal | ICD-10-CM

## 2017-12-26 DIAGNOSIS — Z7982 Long term (current) use of aspirin: Secondary | ICD-10-CM | POA: Diagnosis not present

## 2017-12-26 DIAGNOSIS — R0989 Other specified symptoms and signs involving the circulatory and respiratory systems: Secondary | ICD-10-CM

## 2017-12-26 DIAGNOSIS — Z794 Long term (current) use of insulin: Secondary | ICD-10-CM | POA: Diagnosis not present

## 2017-12-26 DIAGNOSIS — E46 Unspecified protein-calorie malnutrition: Secondary | ICD-10-CM | POA: Diagnosis not present

## 2017-12-26 DIAGNOSIS — Z87442 Personal history of urinary calculi: Secondary | ICD-10-CM

## 2017-12-26 DIAGNOSIS — Z89612 Acquired absence of left leg above knee: Secondary | ICD-10-CM | POA: Diagnosis present

## 2017-12-26 DIAGNOSIS — E1040 Type 1 diabetes mellitus with diabetic neuropathy, unspecified: Secondary | ICD-10-CM | POA: Diagnosis not present

## 2017-12-26 DIAGNOSIS — E8809 Other disorders of plasma-protein metabolism, not elsewhere classified: Secondary | ICD-10-CM

## 2017-12-26 DIAGNOSIS — G8918 Other acute postprocedural pain: Secondary | ICD-10-CM

## 2017-12-26 DIAGNOSIS — E1165 Type 2 diabetes mellitus with hyperglycemia: Secondary | ICD-10-CM

## 2017-12-26 DIAGNOSIS — Z87891 Personal history of nicotine dependence: Secondary | ICD-10-CM

## 2017-12-26 LAB — COMPREHENSIVE METABOLIC PANEL
ALT: 24 U/L (ref 0–44)
AST: 18 U/L (ref 15–41)
Albumin: 2.7 g/dL — ABNORMAL LOW (ref 3.5–5.0)
Alkaline Phosphatase: 30 U/L — ABNORMAL LOW (ref 38–126)
Anion gap: 9 (ref 5–15)
BUN: 18 mg/dL (ref 8–23)
CO2: 25 mmol/L (ref 22–32)
Calcium: 8.6 mg/dL — ABNORMAL LOW (ref 8.9–10.3)
Chloride: 105 mmol/L (ref 98–111)
Creatinine, Ser: 1.16 mg/dL (ref 0.61–1.24)
GFR calc Af Amer: >60 mL/min (ref >60)
GFR calc non Af Amer: >60 mL/min (ref >60)
Glucose, Bld: 149 mg/dL — ABNORMAL HIGH (ref 70–99)
POTASSIUM: 4.2 mmol/L (ref 3.5–5.1)
Sodium: 139 mmol/L (ref 135–145)
Total Bilirubin: 0.6 mg/dL (ref 0.3–1.2)
Total Protein: 5.8 g/dL — ABNORMAL LOW (ref 6.5–8.1)

## 2017-12-26 LAB — CBC WITH DIFFERENTIAL/PLATELET
Abs Immature Granulocytes: 0.35 10*3/uL — ABNORMAL HIGH (ref 0.00–0.07)
Basophils Absolute: 0.1 10*3/uL (ref 0.0–0.1)
Basophils Relative: 1 %
EOS PCT: 2 %
Eosinophils Absolute: 0.2 10*3/uL (ref 0.0–0.5)
HCT: 31.2 % — ABNORMAL LOW (ref 39.0–52.0)
Hemoglobin: 10 g/dL — ABNORMAL LOW (ref 13.0–17.0)
Immature Granulocytes: 3 %
Lymphocytes Relative: 10 %
Lymphs Abs: 1.3 10*3/uL (ref 0.7–4.0)
MCH: 27 pg (ref 26.0–34.0)
MCHC: 32.1 g/dL (ref 30.0–36.0)
MCV: 84.3 fL (ref 80.0–100.0)
MONO ABS: 0.7 10*3/uL (ref 0.1–1.0)
Monocytes Relative: 5 %
Neutro Abs: 10.1 10*3/uL — ABNORMAL HIGH (ref 1.7–7.7)
Neutrophils Relative %: 79 %
Platelets: 262 10*3/uL (ref 150–400)
RBC: 3.7 MIL/uL — ABNORMAL LOW (ref 4.22–5.81)
RDW: 16.3 % — AB (ref 11.5–15.5)
WBC: 12.7 10*3/uL — AB (ref 4.0–10.5)
nRBC: 0 % (ref 0.0–0.2)

## 2017-12-26 LAB — GLUCOSE, CAPILLARY
Glucose-Capillary: 143 mg/dL — ABNORMAL HIGH (ref 70–99)
Glucose-Capillary: 217 mg/dL — ABNORMAL HIGH (ref 70–99)

## 2017-12-26 MED ORDER — ENOXAPARIN SODIUM 40 MG/0.4ML ~~LOC~~ SOLN: 40.0000 mg | SUBCUTANEOUS | Status: DC

## 2017-12-26 MED ORDER — POLYETHYLENE GLYCOL 3350 17 G PO PACK: 17.0000 g | PACK | Freq: Every day | ORAL | 0 refills | Status: DC | PRN

## 2017-12-26 MED ORDER — POLYETHYLENE GLYCOL 3350 17 G PO PACK: 17.0000 g | PACK | Freq: Every day | ORAL | Status: DC | PRN

## 2017-12-26 MED ORDER — ENOXAPARIN SODIUM 40 MG/0.4ML ~~LOC~~ SOLN
40.0000 mg | SUBCUTANEOUS | Status: DC
  Administered 2017-12-26 – 2018-01-02 (×8): 40 mg via SUBCUTANEOUS
  Filled 2017-12-26 (×8): qty 0.4

## 2017-12-26 MED ORDER — BISACODYL 10 MG RE SUPP: 10.0000 mg | Freq: Every day | RECTAL | Status: DC | PRN

## 2017-12-26 MED ORDER — MUPIROCIN 2 % EX OINT
1.0000 "application " | TOPICAL_OINTMENT | Freq: Two times a day (BID) | CUTANEOUS | Status: AC
  Administered 2017-12-26 – 2017-12-28 (×4): 1 via NASAL
  Filled 2017-12-26: qty 22

## 2017-12-26 MED ORDER — INSULIN GLARGINE 100 UNIT/ML ~~LOC~~ SOLN
42.0000 [IU] | Freq: Every day | SUBCUTANEOUS | Status: DC
  Administered 2017-12-27 – 2018-01-03 (×8): 42 [IU] via SUBCUTANEOUS
  Filled 2017-12-26 (×9): qty 0.42

## 2017-12-26 MED ORDER — HYDROCODONE-ACETAMINOPHEN 5-325 MG PO TABS
1.0000 | ORAL_TABLET | ORAL | Status: DC | PRN
  Administered 2017-12-27 – 2018-01-01 (×8): 1 via ORAL
  Filled 2017-12-26 (×8): qty 1

## 2017-12-26 MED ORDER — SORBITOL 70 % SOLN: 30.0000 mL | Freq: Every day | Status: DC | PRN

## 2017-12-26 MED ORDER — INSULIN ASPART 100 UNIT/ML ~~LOC~~ SOLN
0.0000 [IU] | Freq: Three times a day (TID) | SUBCUTANEOUS | Status: DC
  Administered 2017-12-26 – 2017-12-27 (×2): 3 [IU] via SUBCUTANEOUS
  Administered 2017-12-27: 2 [IU] via SUBCUTANEOUS
  Administered 2017-12-28 – 2017-12-30 (×9): 3 [IU] via SUBCUTANEOUS
  Administered 2017-12-31: 5 [IU] via SUBCUTANEOUS
  Administered 2017-12-31: 3 [IU] via SUBCUTANEOUS
  Administered 2017-12-31: 5 [IU] via SUBCUTANEOUS
  Administered 2018-01-01: 3 [IU] via SUBCUTANEOUS
  Administered 2018-01-01: 5 [IU] via SUBCUTANEOUS
  Administered 2018-01-01: 3 [IU] via SUBCUTANEOUS
  Administered 2018-01-02: 2 [IU] via SUBCUTANEOUS
  Administered 2018-01-02 – 2018-01-03 (×3): 3 [IU] via SUBCUTANEOUS

## 2017-12-26 MED ORDER — TAMSULOSIN HCL 0.4 MG PO CAPS
0.4000 mg | ORAL_CAPSULE | Freq: Every day | ORAL | Status: DC
  Administered 2017-12-27 – 2018-01-03 (×8): 0.4 mg via ORAL
  Filled 2017-12-26 (×8): qty 1

## 2017-12-26 MED ORDER — SALINE SPRAY 0.65 % NA SOLN
1.0000 | NASAL | Status: DC | PRN
  Filled 2017-12-26: qty 44

## 2017-12-26 MED ORDER — CARVEDILOL 25 MG PO TABS
25.0000 mg | ORAL_TABLET | Freq: Two times a day (BID) | ORAL | Status: DC
  Administered 2017-12-26 – 2018-01-03 (×16): 25 mg via ORAL
  Filled 2017-12-26 (×16): qty 1

## 2017-12-26 MED ORDER — METHOCARBAMOL 1000 MG/10ML IJ SOLN
500.0000 mg | Freq: Four times a day (QID) | INTRAVENOUS | Status: DC | PRN
  Filled 2017-12-26: qty 5

## 2017-12-26 MED ORDER — ASPIRIN 325 MG PO TABS
325.0000 mg | ORAL_TABLET | Freq: Every day | ORAL | Status: DC
  Administered 2017-12-27 – 2018-01-03 (×8): 325 mg via ORAL
  Filled 2017-12-26 (×8): qty 1

## 2017-12-26 MED ORDER — OXYCODONE HCL 5 MG PO TABS
5.0000 mg | ORAL_TABLET | ORAL | Status: DC | PRN
  Administered 2017-12-26 – 2018-01-02 (×9): 10 mg via ORAL
  Filled 2017-12-26 (×9): qty 2

## 2017-12-26 MED ORDER — METHOCARBAMOL 500 MG PO TABS
500.0000 mg | ORAL_TABLET | Freq: Four times a day (QID) | ORAL | Status: DC | PRN
  Administered 2017-12-28 – 2018-01-01 (×3): 500 mg via ORAL
  Filled 2017-12-26 (×3): qty 1

## 2017-12-26 MED ORDER — ACETAMINOPHEN 325 MG PO TABS
325.0000 mg | ORAL_TABLET | Freq: Four times a day (QID) | ORAL | Status: DC | PRN
  Administered 2017-12-26 – 2017-12-30 (×4): 650 mg via ORAL
  Filled 2017-12-26 (×5): qty 2

## 2017-12-26 NOTE — Progress Notes (Signed)
Pt admitted to 4M02 at 1440 via bed side rails up x 4. Belongings present. A&O x 4. Very pleasant to interact with. Resting in bed with HOB elevated. Side rails x 3 present. Oriented to call bell and room. Call in reach. Will cont to monitor.   Erie Noe, LPN

## 2017-12-26 NOTE — Progress Notes (Signed)
Inpatient Rehabilitation Admissions Coordinator  I met with patient at bedside to discuss goals and expectations of an inpt rehab admit. He prefers inpt rehab rather than SNF. I contacted Dr. Verlon Au, RN, and RN CM . We will make the arrangements to admit today.  Danne Baxter, RN, MSN Rehab Admissions Coordinator [phone] 12/26/2017 10:49 AM

## 2017-12-26 NOTE — Progress Notes (Signed)
Charlett Blake, MD  Physician  Physical Medicine and Rehabilitation  Consult Note  Signed  Date of Service:  12/25/2017 2:01 PM       Related encounter: ED to Hosp-Admission (Discharged) from 12/20/2017 in [facility] All Collapse All    Show:Clear all [x]Manual[x]Template[]Copied  Added by: [x]Angiulli, Lavon Paganini, PA-C[x]Kirsteins, Luanna Salk, MD  []Hover for details      Physical Medicine and Rehabilitation Consult Reason for Consult: Decreased functional mobility Referring Physician: [facility]   HPI: [name] is a 67 y.o.right handed male with history of hypertension, diabetes mellitus, left BKA 2014 of which at that time was discharged directly home. Per chart review lives with girlfriend. One level home with ramped entrance.patient with assistance as needed. Patient initially with prosthesis after left BKA but later decreased wearing time due to ulceration of left stump. Presented 12/22/2017 with osteomyelitis of left BKA as well as findings of osteomyelitis right fifth metatarsal. Underwent left AKA, right fifth ray amputation 12/24/2017 per Dr. [name]. Hospital course pain management.Bilateral wound vacs applied. Leukocytosis 22,400. Subcutaneous Lovenox for DVT prophylaxis. Therapy evaluation completed with recommendations of physical medicine rehabilitation consult.    Review of Systems  Constitutional: Negative for chills and fever.  HENT: Negative for hearing loss.   Eyes: Negative for blurred vision and double vision.  Respiratory: Negative for cough and shortness of breath.   Cardiovascular: Positive for leg swelling. Negative for chest pain and palpitations.  Gastrointestinal: Positive for constipation. Negative for nausea and vomiting.  Genitourinary: Positive for urgency. Negative for dysuria, flank pain and hematuria.  Musculoskeletal: Positive for joint pain and myalgias.  Skin:  Negative for rash.  All other systems reviewed and are negative.      Past Medical History:  Diagnosis Date  . Coronary atherosclerosis of unspecified type of vessel, native or graft   . Diabetes mellitus without complication ([place])   . Hemorrhage of rectum and anus   . History of kidney stones   . Impotence of organic origin   . Internal hemorrhoids without mention of complication   . Malignant neoplasm of prostate ([place])   . Mononeuritis of unspecified site   . Osteoarthrosis, unspecified whether generalized or localized, unspecified site   . Other and unspecified hyperlipidemia   . Personal history of colonic polyps   . Personal history of diabetic foot ulcer    saw wound center, resolved 05/08/2010  . Personal history of gallstones   . Routine general medical examination at a health care facility   . Type II or unspecified type diabetes mellitus with neurological manifestations, not stated as uncontrolled(250.60)   . Unspecified glaucoma(365.9)   . Unspecified sleep apnea    cpcp  . Unspecified venous (peripheral) insufficiency         Past Surgical History:  Procedure Laterality Date  . AMPUTATION Left 12/30/2012   Procedure: AMPUTATION RAY ;  Surgeon: Meredith Pel, MD;  Location: [facility];  Service: Orthopedics;  Laterality: Left;  LEFT GREAT TOE RAY AMPUTATION  . AMPUTATION Left 02/23/2016   Procedure: Left Below Knee Amputation;  Surgeon: [name], MD;  Location: [facility];  Service: Orthopedics;  Laterality: Left;  . AMPUTATION Left 12/24/2017   Procedure: LEFT ABOVE KNEE AMPUTATION;  Surgeon: [name], MD;  Location: [facility];  Service: Orthopedics;  Laterality: Left;  . AMPUTATION Right 12/24/2017   Procedure: RIGHT 5TH  RAY AMPUTATION;  Surgeon: [name], MD;  Location: [facility];  Service: Orthopedics;  Laterality: Right;  . APPENDECTOMY    . BASAL CELL CARCINOMA EXCISION  2/16   left forearm  . CARDIAC CATHETERIZATION  1998    Negative  . CATARACT EXTRACTION Right 2017   then lid surgery  . CORONARY ARTERY BYPASS GRAFT  09/2005   Post op AFIB  . EYE SURGERY    . FOOT BONE EXCISION Left 11/03/2013   DR DUDA   . I&D EXTREMITY Left 11/03/2013   Procedure: Left Foot Partial Bone Excision Cuboid and Medial Cuneiform, Wound Closures;  Surgeon: [name], MD;  Location: [facility];  Service: Orthopedics;  Laterality: Left;  . INSERTION PROSTATE RADIATION SEED  2009   RT and seeds for prostate cancer  . KIDNEY STONE SURGERY  04/1993  . RCA stents  04/2003   EF 55%  . RETINAL DETACHMENT SURGERY  2002-2003  . thrombosed vein  1993   Right leg        Family History  Problem Relation Age of Onset  . Lung cancer Father   . Multiple sclerosis Mother   . Heart attack Other        paternal aunts and uncles  . Peripheral vascular disease Maternal Grandfather        several amputations   Social History:  reports that he quit smoking about 16 years ago. His smoking use included cigars and cigarettes. He has a 70.00 pack-year smoking history. He has never used smokeless tobacco. He reports current alcohol use. He reports that he does not use drugs. Allergies:       Allergies  Allergen Reactions  . Atorvastatin Other (See Comments)    REACTION: myalgias  . Ezetimibe Other (See Comments)    Body ache  . Simvastatin Other (See Comments)    Body ache         Medications Prior to Admission  Medication Sig Dispense Refill  . Amino Acids-Protein Hydrolys (FEEDING SUPPLEMENT, PRO-STAT SUGAR FREE 64,) LIQD Take 30 mLs by mouth 3 (three) times daily with meals.    Marland Kitchen aspirin 325 MG tablet Take 325 mg by mouth daily.      . carvedilol (COREG) 25 MG tablet TAKE 1 TABLET TWICE DAILY (Patient taking differently: Take 25 mg by mouth 2 (two) times daily with a meal. ) 180 tablet 3  . doxycycline (VIBRAMYCIN) 100 MG capsule Take 1 capsule (100 mg total) by mouth 2 (two) times daily. (Patient taking  differently: Take 100 mg by mouth 2 (two) times daily. 14 day supply) 20 capsule 0  . HYDROcodone-acetaminophen (NORCO) 5-325 MG tablet Take 1 tablet by mouth every 4 (four) hours as needed for moderate pain. 10 tablet 0  . insulin aspart (NOVOLOG FLEXPEN) 100 UNIT/ML FlexPen INJECT&nbsp;&nbsp;30&nbsp;&nbsp;TO&nbsp;&nbsp;35 UNITS SUBCUTANEOUSLY THREE TIMES DAILY WITH MEALS (Patient taking differently: Inject 35 Units into the skin 3 (three) times daily with meals. ) 90 mL 1  . Insulin Glargine (TOUJEO SOLOSTAR) 300 UNIT/ML SOPN Inject 60 Units into the skin daily. 18 pen 3  . losartan-hydrochlorothiazide (HYZAAR) 50-12.5 MG tablet TAKE 1 TABLET EVERY DAY (Patient taking differently: Take 1 tablet by mouth daily. ) 90 tablet 3  . metFORMIN (GLUCOPHAGE) 1000 MG tablet Take 1 tablet (1,000 mg total) by mouth daily. (Patient taking differently: Take 1,000 mg by mouth every evening. ) 90 tablet 3  . mupirocin ointment (BACTROBAN) 2 % Apply to wound daily (Patient taking differently: Apply 1  application topically daily as needed (wound healing). Apply to wound daily) 22 g 0  . nitroGLYCERIN (NITRODUR - DOSED IN MG/24 HR) 0.2 mg/hr patch PLACE 1 PATCH (0.2 MG TOTAL) DAILY ONTO THE SKIN. (Patient taking differently: Place 0.2 mg onto the skin daily. On left leg) 90 patch 4  . potassium citrate (UROCIT-K) 10 MEQ (1080 MG) SR tablet Take 20 mEq by mouth 2 (two) times daily.   12  . Probiotic Product (PROBIOTIC DAILY PO) Take 1 capsule by mouth daily.    . tamsulosin (FLOMAX) 0.4 MG CAPS capsule Take 1 capsule (0.4 mg total) by mouth daily. 30 capsule 0  . triamcinolone (KENALOG) 0.025 % cream APPLY 1 APPLICATION TOPICALLY 3 (THREE) TIMES DAILY AS NEEDED. (Patient taking differently: Apply 1 application topically 3 (three) times daily as needed (rash on back). ) 30 g 3  . vitamin C (ASCORBIC ACID) 500 MG tablet Take 500 mg by mouth 2 (two) times daily.    Marland Kitchen zinc gluconate 50 MG tablet Take 50 mg by mouth  daily.    Marland Kitchen ACCU-CHEK FASTCLIX LANCETS MISC Use to check sugar 3 times daily dx- E11.42 300 each 5  . Blood Glucose Monitoring Suppl (ACCU-CHEK GUIDE) w/Device KIT 1 kit by Other route 3 (three) times daily. Use to check blood sugars 3 times daily, Dx Code E11.42 1 kit 1  . DROPLET PEN NEEDLES 31G X 5 MM MISC USE TO INJECT INSULIN FOUR TIMES DAILY AS INSTRUCTED (FOR DIABETES) 400 each 0  . glucose blood (ACCU-CHEK GUIDE) test strip Use as instructed to check sugar 3 times daily dx- E11.42 300 each 5  . ondansetron (ZOFRAN) 4 MG tablet Take 1 tablet (4 mg total) by mouth every 6 (six) hours as needed for nausea or vomiting. (Patient not taking: Reported on 12/20/2017) 12 tablet 0  . pentoxifylline (TRENTAL) 400 MG CR tablet Take 1 tablet (400 mg total) by mouth 3 (three) times daily with meals. (Patient not taking: Reported on 12/20/2017) 90 tablet 3    Home: [place] expects to be discharged to:: Private residence Living Arrangements: Spouse/significant other Available Help at Discharge: Family, Available 24 hours/day Type of Home: House Home Access: [place]: One level Bathroom Shower/Tub: Multimedia programmer: Handicapped height Bathroom Accessibility: Yes Home Equipment: Environmental consultant - 2 wheels, Environmental consultant - 4 wheels, Shower seat, Grab bars - tub/shower, Hand held shower head, Wheelchair - manual, Wheelchair - power, Adaptive equipment(manual WC is falling apart) Adaptive Equipment: Reacher, Long-handled sponge  Functional History: Prior Function Level of Independence: Independent with assistive device(s) Comments: WC level  Functional Status:  Mobility: Bed Mobility Overal bed mobility: Needs Assistance Bed Mobility: Supine to Sit Supine to sit: Mod assist, +2 for physical assistance General bed mobility comments: mod A for initial trunk elevation, assist +2 for turning in the bed to prepare for AP transfer, use of bed pad to  assist Transfers Overall transfer level: Needs assistance Transfers: Government social research officer transfers: Min assist, +2 physical assistance, +2 safety/equipment General transfer comment: use of bed pad to assist , Pt able to complete most of transfer with BUE and cues for hand placement - able to maintain NWB through BLE  ADL: ADL Overall ADL's : Needs assistance/impaired Eating/Feeding: Independent Grooming: Oral care, Wash/dry face, Set up, Sitting Grooming Details (indicate cue type and reason): in recliner Upper Body Bathing: Set up, Sitting Lower Body Bathing: Moderate assistance Upper Body Dressing : Set up Lower Body Dressing: Maximal assistance Toilet  Transfer: Minimal assistance, +2 for physical assistance, +2 for safety/equipment, Anterior/posterior Toilet Transfer Details (indicate cue type and reason): simualted through recliner transfer, bed pad used to assist, 3rd person helpful to manage leg Toileting- Clothing Manipulation and Hygiene: Maximal assistance Functional mobility during ADLs: Minimal assistance, +2 for physical assistance, +2 for safety/equipment(AP transfers as Pt NWB through BLE)  Cognition: Cognition Overall Cognitive Status: Within Functional Limits for tasks assessed Orientation Level: Oriented X4 Cognition Arousal/Alertness: Awake/alert Behavior During Therapy: WFL for tasks assessed/performed Overall Cognitive Status: Within Functional Limits for tasks assessed  Blood pressure 116/60, pulse 84, temperature 98.2 F (36.8 C), temperature source Oral, resp. rate 16, height 6' 2.02" (1.88 m), weight 128.1 kg, SpO2 96 %. Physical Exam  Vitals reviewed. Constitutional: He is oriented to person, place, and time.  HENT:  Head: Normocephalic.  Eyes: EOM are normal.  Neck: Normal range of motion. Neck supple. No thyromegaly present.  Cardiovascular: Normal rate, regular rhythm and normal heart sounds.  Respiratory: Effort  normal and breath sounds normal. No respiratory distress.  GI: Soft. Bowel sounds are normal. He exhibits no distension.  Neurological: He is alert and oriented to person, place, and time.  Skin:  Left AKA is dressed. Right foot dressing in place. Bilateral wound vacs in place. Appropriately tender.  Motor strength is 5/5 bilateral deltoid, bicep, tricep, grip 4/5 right hip flexor knee extensor 3- ankle dorsiflexor plantar flexor 3/5 left hip flexor left hip abduction and adduction Sensation absent to light touch below mid calf area on the right side.  Intact light touch bilateral upper limbs but has some tingling LabResultsLast24Hours       Results for orders placed or performed during the hospital encounter of 12/20/17 (from the past 24 hour(s))  Glucose, capillary     Status: Abnormal   Collection Time: 12/24/17  2:57 PM  Result Value Ref Range   Glucose-Capillary 141 (H) 70 - 99 mg/dL   Comment 1 Notify RN   Glucose, capillary     Status: Abnormal   Collection Time: 12/24/17  3:53 PM  Result Value Ref Range   Glucose-Capillary 161 (H) 70 - 99 mg/dL  Glucose, capillary     Status: Abnormal   Collection Time: 12/24/17  9:27 PM  Result Value Ref Range   Glucose-Capillary 354 (H) 70 - 99 mg/dL   Comment 1 Notify RN    Comment 2 Document in Chart   CBC with Differential/Platelet     Status: Abnormal   Collection Time: 12/25/17  2:14 AM  Result Value Ref Range   WBC 22.4 (H) 4.0 - 10.5 K/uL   RBC 4.32 4.22 - 5.81 MIL/uL   Hemoglobin 11.6 (L) 13.0 - 17.0 g/dL   HCT 36.5 (L) 39.0 - 52.0 %   MCV 84.5 80.0 - 100.0 fL   MCH 26.9 26.0 - 34.0 pg   MCHC 31.8 30.0 - 36.0 g/dL   RDW 15.9 (H) 11.5 - 15.5 %   Platelets 323 150 - 400 K/uL   nRBC 0.0 0.0 - 0.2 %   Neutrophils Relative % 87 %   Neutro Abs 19.6 (H) 1.7 - 7.7 K/uL   Lymphocytes Relative 4 %   Lymphs Abs 0.9 0.7 - 4.0 K/uL   Monocytes Relative 3 %   Monocytes Absolute 0.7 0.1 - 1.0 K/uL    Eosinophils Relative 0 %   Eosinophils Absolute 0.0 0.0 - 0.5 K/uL   Basophils Relative 1 %   Basophils Absolute 0.1 0.0 - 0.1 K/uL  Immature Granulocytes 5 %   Abs Immature Granulocytes 1.07 (H) 0.00 - 0.07 K/uL  Basic metabolic panel     Status: Abnormal   Collection Time: 12/25/17  2:14 AM  Result Value Ref Range   Sodium 136 135 - 145 mmol/L   Potassium 5.1 3.5 - 5.1 mmol/L   Chloride 103 98 - 111 mmol/L   CO2 26 22 - 32 mmol/L   Glucose, Bld 290 (H) 70 - 99 mg/dL   BUN 21 8 - 23 mg/dL   Creatinine, Ser 1.27 (H) 0.61 - 1.24 mg/dL   Calcium 8.7 (L) 8.9 - 10.3 mg/dL   GFR calc non Af Amer 57 (L) >60 mL/min   GFR calc Af Amer >60 >60 mL/min   Anion gap 7 5 - 15  Glucose, capillary     Status: Abnormal   Collection Time: 12/25/17  6:30 AM  Result Value Ref Range   Glucose-Capillary 210 (H) 70 - 99 mg/dL   Comment 1 Notify RN    Comment 2 Document in Chart   Glucose, capillary     Status: Abnormal   Collection Time: 12/25/17 11:30 AM  Result Value Ref Range   Glucose-Capillary 273 (H) 70 - 99 mg/dL     ImagingResults(Last48hours)  No results found.     Assessment/Plan: Diagnosis: Left above-knee amputation secondary to osteomyelitis, right fifth ray amputation secondary to osteomyelitis nonweightbearing bilateral lower limb 1. Does the need for close, 24 hr/day medical supervision in concert with the patient's rehab needs make it unreasonable for this patient to be served in a less intensive setting? Yes 2. Co-Morbidities requiring supervision/potential complications: Diabetes with peripheral neuropathy, hypertension, coronary artery disease status post CABG 3. Due to bladder management, bowel management, safety, skin/wound care, disease management, medication administration, pain management and patient education, does the patient require 24 hr/day rehab nursing? Yes 4. Does the patient require coordinated care of a physician, rehab nurse, PT  (1-2 hrs/day, 5 days/week) and OT (1-2 hrs/day, 5 days/week) to address physical and functional deficits in the context of the above medical diagnosis(es)? Yes Addressing deficits in the following areas: balance, endurance, locomotion, strength, transferring, bowel/bladder control, bathing, dressing, feeding, grooming, toileting, cognition, speech, language, swallowing and psychosocial support 5. Can the patient actively participate in an intensive therapy program of at least 3 hrs of therapy per day at least 5 days per week? Yes 6. The potential for patient to make measurable gains while on inpatient rehab is good 7. Anticipated functional outcomes upon discharge from inpatient rehab are modified independent  with PT, modified independent with OT, n/a with SLP. 8. Estimated rehab length of stay to reach the above functional goals is: 7-10d 9. Anticipated D/C setting: Home 10. Anticipated post D/C treatments: [facility] therapy 11. Overall Rehab/Functional Prognosis: good  RECOMMENDATIONS: This patient's condition is appropriate for continued rehabilitative care in the following setting: CIR Patient has agreed to participate in recommended program. Yes Note that insurance prior authorization may be required for reimbursement for recommended care.  Comment:  "I have personally performed a face to face diagnostic evaluation of this patient.  Additionally, I have reviewed and concur with the physician assistant's documentation above." Charlett Blake M.D. [facility] FAAPM&R (Sports Med, Neuromuscular Med) Diplomate Am Board of Electrodiagnostic Med  Elizabeth Sauer 12/25/2017        Revision History  Routing History

## 2017-12-26 NOTE — H&P (Signed)
Physical Medicine and Rehabilitation Admission H&P    Chief Complaint  Patient presents with  . Wound Infection  : HPI: [name] is a 67 year old right-handed male with history of hypertension, diabetes mellitus, left BKA 2014 of which at that time was discharged directly home to receive home health therapies. Per chart review and patient, lives with girlfriend. One level home with ramped entrance. Patient with assistance as needed. Patient initially with prosthesis after left BKA but later decreased wearing time due to ulceration of left stump. Presented 12/22/2017 with progressive ischemic changes of stump findings of osteomyelitis as well as findings of osteomyelitis right fifth metatarsal. Underwent left AKA, right fifth ray amputation 12/24/2017 per Dr. [name]. Hospital course pain management requiring IV Dilaudid. Bilateral wound vacs applied. Subcutaneous Lovenox for DVT prophylaxis. Acute blood loss anemia 10.0 and monitored. Therapy evaluations completed with recommendations of physical medicine rehabilitation consult. Patient was admitted for a comprehensive  rehabilitation program  Review of Systems  Constitutional: Negative for chills and fever.  HENT: Negative for hearing loss.   Eyes: Negative for blurred vision and double vision.  Respiratory: Negative for cough and shortness of breath.   Cardiovascular: Positive for leg swelling. Negative for chest pain.  Gastrointestinal: Positive for constipation. Negative for nausea and vomiting.  Genitourinary: Positive for urgency.  Musculoskeletal: Positive for joint pain and myalgias.  Skin: Negative for rash.  All other systems reviewed and are negative.  Past Medical History:  Diagnosis Date  . Coronary atherosclerosis of unspecified type of vessel, native or graft   . Diabetes mellitus without complication ([place])   . Hemorrhage of rectum and anus   . History of kidney stones   . Impotence of organic origin   . Internal  hemorrhoids without mention of complication   . Malignant neoplasm of prostate ([place])   . Mononeuritis of unspecified site   . Osteoarthrosis, unspecified whether generalized or localized, unspecified site   . Other and unspecified hyperlipidemia   . Personal history of colonic polyps   . Personal history of diabetic foot ulcer    saw wound center, resolved 05/08/2010  . Personal history of gallstones   . Routine general medical examination at a health care facility   . Type II or unspecified type diabetes mellitus with neurological manifestations, not stated as uncontrolled(250.60)   . Unspecified glaucoma(365.9)   . Unspecified sleep apnea    cpcp  . Unspecified venous (peripheral) insufficiency    Past Surgical History:  Procedure Laterality Date  . AMPUTATION Left 12/30/2012   Procedure: AMPUTATION RAY ;  Surgeon: Meredith Pel, MD;  Location: [facility];  Service: Orthopedics;  Laterality: Left;  LEFT GREAT TOE RAY AMPUTATION  . AMPUTATION Left 02/23/2016   Procedure: Left Below Knee Amputation;  Surgeon: [name], MD;  Location: [place];  Service: Orthopedics;  Laterality: Left;  . AMPUTATION Left 12/24/2017   Procedure: LEFT ABOVE KNEE AMPUTATION;  Surgeon: [name], MD;  Location: [place];  Service: Orthopedics;  Laterality: Left;  . AMPUTATION Right 12/24/2017   Procedure: RIGHT 5TH RAY AMPUTATION;  Surgeon: [name], MD;  Location: [place];  Service: Orthopedics;  Laterality: Right;  . APPENDECTOMY    . BASAL CELL CARCINOMA EXCISION  2/16   left forearm  . CARDIAC CATHETERIZATION  1998   Negative  . CATARACT EXTRACTION Right 2017   then lid surgery  . CORONARY ARTERY BYPASS GRAFT  09/2005   Post op AFIB  .  EYE SURGERY    . FOOT BONE EXCISION Left 11/03/2013   DR DUDA   . I&D EXTREMITY Left 11/03/2013   Procedure: Left Foot Partial Bone Excision Cuboid and Medial Cuneiform, Wound Closures;  Surgeon: [name], MD;  Location: [place];  Service: Orthopedics;   Laterality: Left;  . INSERTION PROSTATE RADIATION SEED  2009   RT and seeds for prostate cancer  . KIDNEY STONE SURGERY  04/1993  . RCA stents  04/2003   EF 55%  . RETINAL DETACHMENT SURGERY  2002-2003  . thrombosed vein  1993   Right leg   Family History  Problem Relation Age of Onset  . Lung cancer Father   . Multiple sclerosis Mother   . Heart attack Other        paternal aunts and uncles  . Peripheral vascular disease Maternal Grandfather        several amputations   Social History:  reports that he quit smoking about 16 years ago. His smoking use included cigars and cigarettes. He has a 70.00 pack-year smoking history. He has never used smokeless tobacco. He reports current alcohol use. He reports that he does not use drugs. Allergies:  Allergies  Allergen Reactions  . Atorvastatin Other (See Comments)    REACTION: myalgias  . Ezetimibe Other (See Comments)    Body ache  . Simvastatin Other (See Comments)    Body ache   Medications Prior to Admission  Medication Sig Dispense Refill  . Amino Acids-Protein Hydrolys (FEEDING SUPPLEMENT, PRO-STAT SUGAR FREE 64,) LIQD Take 30 mLs by mouth 3 (three) times daily with meals.    Marland Kitchen aspirin 325 MG tablet Take 325 mg by mouth daily.      . carvedilol (COREG) 25 MG tablet TAKE 1 TABLET TWICE DAILY (Patient taking differently: Take 25 mg by mouth 2 (two) times daily with a meal. ) 180 tablet 3  . doxycycline (VIBRAMYCIN) 100 MG capsule Take 1 capsule (100 mg total) by mouth 2 (two) times daily. (Patient taking differently: Take 100 mg by mouth 2 (two) times daily. 14 day supply) 20 capsule 0  . HYDROcodone-acetaminophen (NORCO) 5-325 MG tablet Take 1 tablet by mouth every 4 (four) hours as needed for moderate pain. 10 tablet 0  . insulin aspart (NOVOLOG FLEXPEN) 100 UNIT/ML FlexPen INJECT&nbsp;&nbsp;30&nbsp;&nbsp;TO&nbsp;&nbsp;35 UNITS SUBCUTANEOUSLY THREE TIMES DAILY WITH MEALS (Patient taking differently: Inject 35 Units into the skin 3  (three) times daily with meals. ) 90 mL 1  . Insulin Glargine (TOUJEO SOLOSTAR) 300 UNIT/ML SOPN Inject 60 Units into the skin daily. 18 pen 3  . losartan-hydrochlorothiazide (HYZAAR) 50-12.5 MG tablet TAKE 1 TABLET EVERY DAY (Patient taking differently: Take 1 tablet by mouth daily. ) 90 tablet 3  . metFORMIN (GLUCOPHAGE) 1000 MG tablet Take 1 tablet (1,000 mg total) by mouth daily. (Patient taking differently: Take 1,000 mg by mouth every evening. ) 90 tablet 3  . mupirocin ointment (BACTROBAN) 2 % Apply to wound daily (Patient taking differently: Apply 1 application topically daily as needed (wound healing). Apply to wound daily) 22 g 0  . nitroGLYCERIN (NITRODUR - DOSED IN MG/24 HR) 0.2 mg/hr patch PLACE 1 PATCH (0.2 MG TOTAL) DAILY ONTO THE SKIN. (Patient taking differently: Place 0.2 mg onto the skin daily. On left leg) 90 patch 4  . potassium citrate (UROCIT-K) 10 MEQ (1080 MG) SR tablet Take 20 mEq by mouth 2 (two) times daily.   12  . Probiotic Product (PROBIOTIC DAILY PO) Take 1 capsule by  mouth daily.    . tamsulosin (FLOMAX) 0.4 MG CAPS capsule Take 1 capsule (0.4 mg total) by mouth daily. 30 capsule 0  . triamcinolone (KENALOG) 0.025 % cream APPLY 1 APPLICATION TOPICALLY 3 (THREE) TIMES DAILY AS NEEDED. (Patient taking differently: Apply 1 application topically 3 (three) times daily as needed (rash on back). ) 30 g 3  . vitamin C (ASCORBIC ACID) 500 MG tablet Take 500 mg by mouth 2 (two) times daily.    Marland Kitchen zinc gluconate 50 MG tablet Take 50 mg by mouth daily.    Marland Kitchen ACCU-CHEK FASTCLIX LANCETS MISC Use to check sugar 3 times daily dx- E11.42 300 each 5  . Blood Glucose Monitoring Suppl (ACCU-CHEK GUIDE) w/Device KIT 1 kit by Other route 3 (three) times daily. Use to check blood sugars 3 times daily, Dx Code E11.42 1 kit 1  . DROPLET PEN NEEDLES 31G X 5 MM MISC USE TO INJECT INSULIN FOUR TIMES DAILY AS INSTRUCTED (FOR DIABETES) 400 each 0  . glucose blood (ACCU-CHEK GUIDE) test strip Use as  instructed to check sugar 3 times daily dx- E11.42 300 each 5  . ondansetron (ZOFRAN) 4 MG tablet Take 1 tablet (4 mg total) by mouth every 6 (six) hours as needed for nausea or vomiting. (Patient not taking: Reported on 12/20/2017) 12 tablet 0  . pentoxifylline (TRENTAL) 400 MG CR tablet Take 1 tablet (400 mg total) by mouth 3 (three) times daily with meals. (Patient not taking: Reported on 12/20/2017) 90 tablet 3    Drug Regimen Review Drug regimen was reviewed and remains appropriate with no significant issues identified  Home: Home Living Family/patient expects to be discharged to:: Private residence Living Arrangements: Spouse/significant other Available Help at Discharge: Family, Available 24 hours/day Type of Home: House Home Access: Ramped entrance Home Layout: One level Bathroom Shower/Tub: Multimedia programmer: Handicapped height Bathroom Accessibility: Yes Home Equipment: Environmental consultant - 2 wheels, Environmental consultant - 4 wheels, Shower seat, Grab bars - tub/shower, Hand held shower head, Wheelchair - manual, Wheelchair - power, Adaptive equipment(manual WC is falling apart) Adaptive Equipment: Reacher, Long-handled sponge   Functional History: Prior Function Level of Independence: Independent with assistive device(s) Comments: WC level   Functional Status:  Mobility: Bed Mobility Overal bed mobility: Needs Assistance Bed Mobility: Supine to Sit Supine to sit: Mod assist, +2 for physical assistance General bed mobility comments: mod A for initial trunk elevation, assist +2 for turning in the bed to prepare for AP transfer, use of bed pad to assist Transfers Overall transfer level: Needs assistance Transfers: Government social research officer transfers: Min assist, +2 physical assistance, +2 safety/equipment General transfer comment: use of bed pad to assist , Pt able to complete most of transfer with BUE and cues for hand placement - able to maintain NWB through  BLE      ADL: ADL Overall ADL's : Needs assistance/impaired Eating/Feeding: Independent Grooming: Oral care, Wash/dry face, Set up, Sitting Grooming Details (indicate cue type and reason): in recliner Upper Body Bathing: Set up, Sitting Lower Body Bathing: Moderate assistance Upper Body Dressing : Set up Lower Body Dressing: Maximal assistance Toilet Transfer: Minimal assistance, +2 for physical assistance, +2 for safety/equipment, Anterior/posterior Toilet Transfer Details (indicate cue type and reason): simualted through recliner transfer, bed pad used to assist, 3rd person helpful to manage leg Toileting- Clothing Manipulation and Hygiene: Maximal assistance Functional mobility during ADLs: Minimal assistance, +2 for physical assistance, +2 for safety/equipment(AP transfers as Pt NWB through BLE)  Cognition: Cognition Overall  Cognitive Status: Within Functional Limits for tasks assessed Orientation Level: Oriented X4 Cognition Arousal/Alertness: Awake/alert Behavior During Therapy: WFL for tasks assessed/performed Overall Cognitive Status: Within Functional Limits for tasks assessed  Physical Exam: Blood pressure 124/67, pulse 91, temperature 98.7 F (37.1 C), temperature source Oral, resp. rate 16, height 6' 2.02" (1.88 m), weight 128.1 kg, SpO2 98 %. Physical Exam  Vitals reviewed. Constitutional: He is oriented to person, place, and time. He appears well-developed.  Obese  HENT:  Head: Normocephalic and atraumatic.  Eyes: EOM are normal. Right eye exhibits no discharge. Left eye exhibits no discharge.  Neck: Normal range of motion. Neck supple. No thyromegaly present.  Cardiovascular: Normal rate, regular rhythm and normal heart sounds.  Respiratory: Effort normal and breath sounds normal. No respiratory distress.  GI: Soft. Bowel sounds are normal. He exhibits no distension.  Musculoskeletal:     Comments: Left AKA Right fifth ray amputation  Neurological: He is  alert and oriented to person, place, and time.  Motor: Bilateral upper extremities: 4+/5 proximal distal Left lower extremity: 4/5 hip flexion (pain inhibition) Right lower extremity: 4/5 proximal distal (some pain in patient) Sensation diminished light touch left foot  Skin:  Bilat  Wound VACS in place LLE/RLE  Psychiatric: He has a normal mood and affect. His behavior is normal. Thought content normal.    Results for orders placed or performed during the hospital encounter of 12/20/17 (from the past 48 hour(s))  Glucose, capillary     Status: Abnormal   Collection Time: 12/24/17 10:50 AM  Result Value Ref Range   Glucose-Capillary 168 (H) 70 - 99 mg/dL  Glucose, capillary     Status: Abnormal   Collection Time: 12/24/17  2:57 PM  Result Value Ref Range   Glucose-Capillary 141 (H) 70 - 99 mg/dL   Comment 1 Notify RN   Glucose, capillary     Status: Abnormal   Collection Time: 12/24/17  3:53 PM  Result Value Ref Range   Glucose-Capillary 161 (H) 70 - 99 mg/dL  Glucose, capillary     Status: Abnormal   Collection Time: 12/24/17  9:27 PM  Result Value Ref Range   Glucose-Capillary 354 (H) 70 - 99 mg/dL   Comment 1 Notify RN    Comment 2 Document in Chart   CBC with Differential/Platelet     Status: Abnormal   Collection Time: 12/25/17  2:14 AM  Result Value Ref Range   WBC 22.4 (H) 4.0 - 10.5 K/uL   RBC 4.32 4.22 - 5.81 MIL/uL   Hemoglobin 11.6 (L) 13.0 - 17.0 g/dL   HCT 36.5 (L) 39.0 - 52.0 %   MCV 84.5 80.0 - 100.0 fL   MCH 26.9 26.0 - 34.0 pg   MCHC 31.8 30.0 - 36.0 g/dL   RDW 15.9 (H) 11.5 - 15.5 %   Platelets 323 150 - 400 K/uL   nRBC 0.0 0.0 - 0.2 %   Neutrophils Relative % 87 %   Neutro Abs 19.6 (H) 1.7 - 7.7 K/uL   Lymphocytes Relative 4 %   Lymphs Abs 0.9 0.7 - 4.0 K/uL   Monocytes Relative 3 %   Monocytes Absolute 0.7 0.1 - 1.0 K/uL   Eosinophils Relative 0 %   Eosinophils Absolute 0.0 0.0 - 0.5 K/uL   Basophils Relative 1 %   Basophils Absolute 0.1 0.0 -  0.1 K/uL   Immature Granulocytes 5 %   Abs Immature Granulocytes 1.07 (H) 0.00 - 0.07 K/uL    Comment:  Performed at [facility], [place] [address] [place], [place] [address]  Basic metabolic panel     Status: Abnormal   Collection Time: 12/25/17  2:14 AM  Result Value Ref Range   Sodium 136 135 - 145 mmol/L   Potassium 5.1 3.5 - 5.1 mmol/L    Comment: DELTA CHECK NOTED   Chloride 103 98 - 111 mmol/L   CO2 26 22 - 32 mmol/L   Glucose, Bld 290 (H) 70 - 99 mg/dL   BUN 21 8 - 23 mg/dL   Creatinine, Ser 1.27 (H) 0.61 - 1.24 mg/dL   Calcium 8.7 (L) 8.9 - 10.3 mg/dL   GFR calc non Af Amer 57 (L) >60 mL/min   GFR calc Af Amer >60 >60 mL/min   Anion gap 7 5 - 15    Comment: Performed at [facility] [address] [place], [place] [address]  Glucose, capillary     Status: Abnormal   Collection Time: 12/25/17  6:30 AM  Result Value Ref Range   Glucose-Capillary 210 (H) 70 - 99 mg/dL   Comment 1 Notify RN    Comment 2 Document in Chart   Glucose, capillary     Status: Abnormal   Collection Time: 12/25/17 11:30 AM  Result Value Ref Range   Glucose-Capillary 273 (H) 70 - 99 mg/dL  Glucose, capillary     Status: Abnormal   Collection Time: 12/25/17  4:04 PM  Result Value Ref Range   Glucose-Capillary 311 (H) 70 - 99 mg/dL  Glucose, capillary     Status: Abnormal   Collection Time: 12/25/17  9:02 PM  Result Value Ref Range   Glucose-Capillary 195 (H) 70 - 99 mg/dL   Comment 1 Notify RN   CBC with Differential/Platelet     Status: Abnormal   Collection Time: 12/26/17  6:06 AM  Result Value Ref Range   WBC 12.7 (H) 4.0 - 10.5 K/uL   RBC 3.70 (L) 4.22 - 5.81 MIL/uL   Hemoglobin 10.0 (L) 13.0 - 17.0 g/dL   HCT 31.2 (L) 39.0 - 52.0 %   MCV 84.3 80.0 - 100.0 fL   MCH 27.0 26.0 - 34.0 pg   MCHC 32.1 30.0 - 36.0 g/dL   RDW 16.3 (H) 11.5 - 15.5 %   Platelets 262 150 - 400 K/uL   nRBC 0.0 0.0 - 0.2 %   Neutrophils Relative % 79 %   Neutro Abs 10.1 (H) 1.7 - 7.7 K/uL    Lymphocytes Relative 10 %   Lymphs Abs 1.3 0.7 - 4.0 K/uL   Monocytes Relative 5 %   Monocytes Absolute 0.7 0.1 - 1.0 K/uL   Eosinophils Relative 2 %   Eosinophils Absolute 0.2 0.0 - 0.5 K/uL   Basophils Relative 1 %   Basophils Absolute 0.1 0.0 - 0.1 K/uL   Immature Granulocytes 3 %   Abs Immature Granulocytes 0.35 (H) 0.00 - 0.07 K/uL    Comment: Performed at [facility], [address], [place], [place] [address]  Comprehensive metabolic panel     Status: Abnormal   Collection Time: 12/26/17  6:06 AM  Result Value Ref Range   Sodium 139 135 - 145 mmol/L   Potassium 4.2 3.5 - 5.1 mmol/L   Chloride 105 98 - 111 mmol/L   CO2 25 22 - 32 mmol/L   Glucose, Bld 149 (H) 70 - 99 mg/dL   BUN 18 8 - 23 mg/dL   Creatinine, Ser 1.16  0.61 - 1.24 mg/dL   Calcium 8.6 (L) 8.9 - 10.3 mg/dL   Total Protein 5.8 (L) 6.5 - 8.1 g/dL   Albumin 2.7 (L) 3.5 - 5.0 g/dL   AST 18 15 - 41 U/L   ALT 24 0 - 44 U/L   Alkaline Phosphatase 30 (L) 38 - 126 U/L   Total Bilirubin 0.6 0.3 - 1.2 mg/dL   GFR calc non Af Amer >60 >60 mL/min   GFR calc Af Amer >60 >60 mL/min   Anion gap 9 5 - 15    Comment: Performed at [facility], [place] [address], [place], [place] [address]  Glucose, capillary     Status: Abnormal   Collection Time: 12/26/17  6:30 AM  Result Value Ref Range   Glucose-Capillary 143 (H) 70 - 99 mg/dL   Comment 1 Notify RN    No results found.     Medical Problem List and Plan: 1.   Decreased functional mobility secondary to left AKA secondary to osteomyelitis as well as right fifth ray amputation 12/24/2017.Nonweightbearing bilateral lower extremities 2.  DVT Prophylaxis/Anticoagulation: Subcutaneous Lovenox. Monitor for any bleeding episodes 3. Pain Management:  Hydrocodone as needed, Robaxin as needed.  DC IV Dilaudid. 4. Mood:  Provide emotional support 5. Neuropsych: This patient is capable of making decisions on his own behalf. 6. Skin/Wound Care:  Routine skin checks 7.  Fluids/Electrolytes/Nutrition:  Routine in and out's with follow-up chemistries 8. Acute blood loss anemia. Follow-up CBC 9. Hypertension. Coreg 25 mg twice a day 10. Diabetes mellitus with peripheral neuropathy. Latest hemoglobin A1c 5.7. Lantus insulin 42 units daily. Check blood sugars before meals and at bedtime. Diabetic teaching 11.  Morbid obesity: Encouraged weight loss  Post Admission Physician Evaluation: 1. Preadmission assessment reviewed and changes made below. 2. Functional deficits secondary  to left AKA and right fifth ray amputation. 3. Patient is admitted to receive collaborative, interdisciplinary care between the physiatrist, rehab nursing staff, and therapy team. 4. Patient's level of medical complexity and substantial therapy needs in context of that medical necessity cannot be provided at a lesser intensity of care such as a SNF. 5. Patient has experienced substantial functional loss from his/her baseline which was documented above under the "Functional History" and "Functional Status" headings.  Judging by the patient's diagnosis, physical exam, and functional history, the patient has potential for functional progress which will result in measurable gains while on inpatient rehab.  These gains will be of substantial and practical use upon discharge  in facilitating mobility and self-care at the household level. 6. Physiatrist will provide 24 hour management of medical needs as well as oversight of the therapy plan/treatment and provide guidance as appropriate regarding the interaction of the two. 7. 24 hour rehab nursing will assist with bowel management, safety, skin/wound care, disease management, pain management and patient education  and help integrate therapy concepts, techniques,education, etc. 8. PT will assess and treat for/with: Lower extremity strength, range of motion, stamina, balance, functional mobility, safety, adaptive techniques and equipment, wound care, coping  skills, pain control, education. Goals are: Mod I wheelchair level. 9. OT will assess and treat for/with: ADL's, functional mobility, safety, upper extremity strength, adaptive techniques and equipment, wound mgt, ego support, and community reintegration.   Goals are: Mod I at wheelchair level. Therapy may not proceed with showering this patient. 10. Case Management and Social Worker will assess and treat for psychological issues and discharge planning. 11. Team conference will be held weekly to  assess progress toward goals and to determine barriers to discharge. 12. Patient will receive at least 3 hours of therapy per day at least 5 days per week. 13. ELOS: 5-7 days.       14. Prognosis:  good  I have personally performed a face to face diagnostic evaluation, including, but not limited to relevant history and physical exam findings, of this patient and developed relevant assessment and plan.  Additionally, I have reviewed and concur with the physician assistant's documentation above.  The patient's status has not changed. The original post admission physician evaluation remains appropriate, and any changes from the pre-admission screening or documentation from the acute chart are noted above.    Delice Lesch, MD, ABPMR Lavon Paganini Angiulli, PA-C 12/26/2017

## 2017-12-26 NOTE — Plan of Care (Signed)
  Problem: Education: Goal: Knowledge of General Education information will improve Description: Including pain rating scale, medication(s)/side effects and non-pharmacologic comfort measures Outcome: Progressing   Problem: Clinical Measurements: Goal: Ability to maintain clinical measurements within normal limits will improve Outcome: Progressing   Problem: Activity: Goal: Risk for activity intolerance will decrease Outcome: Progressing   Problem: Safety: Goal: Ability to remain free from injury will improve Outcome: Progressing   Problem: Skin Integrity: Goal: Risk for impaired skin integrity will decrease Outcome: Progressing   

## 2017-12-26 NOTE — Progress Notes (Signed)
Pt states he can place himself on CPAP when ready. RT plugged in machine and refilled the water chamber per pt's request, and advised pt to notify for RT if any further assistance was needed.

## 2017-12-26 NOTE — Progress Notes (Signed)
Cristina Gong, RN  Rehab Admission Coordinator  Physical Medicine and Rehabilitation  PMR Pre-admission  Signed  Date of Service:  12/26/2017 10:58 AM       Related encounter: ED to Hosp-Admission (Discharged) from 12/20/2017 in [place]         Show:Clear all [x] Manual[x] Template[x] Copied  Added by: [x] Cristina Gong, RN  [] Hover for details PMR Admission Coordinator Pre-Admission Assessment  Patient: [name] is an 67 y.o., male MRN: [id number] DOB: 06-Oct-1950 Height: 6' 2.02" (188 cm) Weight: 128.1 kg                                                                                                                                                  Insurance Information HMO:     PPO:      PCP:      IPA:      80/20:      OTHER: no HMO PRIMARY: Medicare a and b      Policy#: [id number]      Subscriber: pt Benefits:  Phone #: passport one online     Name: 12/25/2017 Eff. Date: 05/07/2013     Deduct: $1364      Out of Pocket Max: none      Life Max: none CIR: 100%      SNF: 20 full days Outpatient: 80%     Co-Pay: 20% Home Health: 100%      Co-Pay: none DME: 80%     Co-Pay: 20% Providers: pt choice  SECONDARY: [employer]      Policy#: [id number]      Subscriber: pt  Medicaid Application Date:       Case Manager:  Disability Application Date:       Case Worker:   Emergency [place]    Name Relation Home Work [place] Son [phone]  [place] Significant other [phone]  [phone]   [name] Daughter   [phone]   [place], [place]   [phone]     Current Medical History  Patient Admitting Diagnosis: left AKA , right 5th toe amputation  History of Present Illness:[name] is a 67 year old right-handed male with history of hypertension, diabetes mellitus, left BKA 2014 of which at that time  was discharged directly home to receive home health therapies.  Patient initially with prosthesis after left BKA but later decreased wearing time due to ulceration of left stump. Presented 12/22/2017 with progressive ischemic changes of stump findings of osteomyelitis as well as findings of osteomyelitis right fifth metatarsal. Underwent left AKA, right fifth ray amputation 12/24/2017 per Dr. [name]. Hospital course pain management. Bilateral wound vac's applied. Subcutaneous Lovenox for DVT prophylaxis. Acute blood loss anemia 10.0  and monitored.   Past Medical History      Past Medical History:  Diagnosis Date  . Coronary atherosclerosis of unspecified type of vessel, native or graft   . Diabetes mellitus without complication ([place])   . Hemorrhage of rectum and anus   . History of kidney stones   . Impotence of organic origin   . Internal hemorrhoids without mention of complication   . Malignant neoplasm of prostate ([place])   . Mononeuritis of unspecified site   . Osteoarthrosis, unspecified whether generalized or localized, unspecified site   . Other and unspecified hyperlipidemia   . Personal history of colonic polyps   . Personal history of diabetic foot ulcer    saw wound center, resolved 05/08/2010  . Personal history of gallstones   . Routine general medical examination at a health care facility   . Type II or unspecified type diabetes mellitus with neurological manifestations, not stated as uncontrolled(250.60)   . Unspecified glaucoma(365.9)   . Unspecified sleep apnea    cpcp  . Unspecified venous (peripheral) insufficiency     Family History  family history includes Heart attack in an other family member; Lung cancer in his father; Multiple sclerosis in his mother; Peripheral vascular disease in his maternal grandfather.  Prior Rehab/Hospitalizations:  Has the patient had major surgery during 100 days prior to admission? No  Current Medications    Current Facility-Administered Medications:  .  0.9 %  sodium chloride infusion, 250 mL, Intravenous, PRN, [name], MD .  0.9 %  sodium chloride infusion, , Intravenous, Continuous, [name], MD, Last Rate: 10 mL/hr at 12/24/17 1651 .  acetaminophen (TYLENOL) tablet 325-650 mg, 325-650 mg, Oral, Q6H PRN, [name], MD .  aspirin tablet 325 mg, 325 mg, Oral, Daily, [name], MD, 325 mg at 12/26/17 0911 .  bisacodyl (DULCOLAX) suppository 10 mg, 10 mg, Rectal, Daily PRN, [name], MD .  carvedilol (COREG) tablet 25 mg, 25 mg, Oral, BID WC, [name], MD, 25 mg at 12/26/17 0911 .  Chlorhexidine Gluconate Cloth 2 % PADS 6 each, 6 each, Topical, Daily, [name], MD, 6 each at 12/26/17 [phone] .  docusate sodium (COLACE) capsule 100 mg, 100 mg, Oral, BID, [name], MD, 100 mg at 12/26/17 0911 .  enoxaparin (LOVENOX) injection 40 mg, 40 mg, Subcutaneous, Q24H, [name], MD, 40 mg at 12/25/17 1649 .  gadobutrol (GADAVIST) 1 MMOL/ML injection 10 mL, 10 mL, Intravenous, Once PRN, [name], MD .  HYDROcodone-acetaminophen (NORCO/VICODIN) 5-325 MG per tablet 1 tablet, 1 tablet, Oral, Q4H PRN, [name], MD, 1 tablet at 12/25/17 2112 .  HYDROmorphone (DILAUDID) injection 0.5-1 mg, 0.5-1 mg, Intravenous, Q4H PRN, [name], MD, 1 mg at 12/25/17 0441 .  insulin aspart (novoLOG) injection 0-15 Units, 0-15 Units, Subcutaneous, TID WC, [name], MD, 2 Units at 12/26/17 [phone] .  insulin glargine (LANTUS) injection 42 Units, 42 Units, Subcutaneous, Daily, Nita Sells, MD, 42 Units at 12/26/17 0912 .  magnesium citrate solution 1 Bottle, 1 Bottle, Oral, Once PRN, [name], MD .  methocarbamol (ROBAXIN) tablet 500 mg, 500 mg, Oral, Q6H PRN, 500 mg at 12/25/17 1017 **OR** methocarbamol (ROBAXIN) 500 mg in dextrose 5 % 50 mL IVPB, 500 mg, Intravenous, Q6H PRN, [name], MD .  metoCLOPramide (REGLAN) tablet 5-10 mg, 5-10 mg,  Oral, Q8H PRN **OR** metoCLOPramide (REGLAN) injection 5-10 mg, 5-10 mg, Intravenous, Q8H  PRN, [name], MD .  mupirocin ointment (BACTROBAN) 2 % 1 application, 1 application, Topical, Daily, [name], MD, 1 application at 83/66/29 1232 .  mupirocin ointment (BACTROBAN) 2 % 1 application, 1 application, Nasal, BID, [name], MD, 1 application at 47/65/46 [phone] .  ondansetron (ZOFRAN) tablet 4 mg, 4 mg, Oral, Q6H PRN, 4 mg at 12/24/17 1626 **OR** ondansetron (ZOFRAN) injection 4 mg, 4 mg, Intravenous, Q6H PRN, [name], MD .  oxyCODONE (Oxy IR/ROXICODONE) immediate release tablet 10-15 mg, 10-15 mg, Oral, Q4H PRN, [name], MD, 15 mg at 12/26/17 0911 .  oxyCODONE (Oxy IR/ROXICODONE) immediate release tablet 5-10 mg, 5-10 mg, Oral, Q4H PRN, [name], MD .  polyethylene glycol (MIRALAX / GLYCOLAX) packet 17 g, 17 g, Oral, Daily PRN, [name], MD .  sodium chloride (OCEAN) 0.65 % nasal spray 1 spray, 1 spray, Each Nare, PRN, Samtani, Jai-Gurmukh, MD .  sodium chloride flush (NS) 0.9 % injection 3 mL, 3 mL, Intravenous, Q12H, [name], MD, 3 mL at 12/26/17 0915 .  sodium chloride flush (NS) 0.9 % injection 3 mL, 3 mL, Intravenous, PRN, [name], MD .  tamsulosin [facility]) capsule 0.4 mg, 0.4 mg, Oral, Daily, [name], MD, 0.4 mg at 12/26/17 0911  Patients Current Diet:     Diet Order                  Diet - low sodium heart healthy         Diet Carb Modified Fluid consistency: Thin; Room service appropriate? Yes  Diet effective now               Precautions / Restrictions Precautions Precautions: Fall Restrictions Weight Bearing Restrictions: Yes RLE Weight Bearing: Non weight bearing LLE Weight Bearing: Non weight bearing   Has the patient had 2 or more falls or a fall with injury in the past year?No  Prior Activity Level Limited Community (1-2x/wk): Mod I wheelchair level transfers  Development worker, international aid /  [place] Devices/Equipment: Wheelchair, Grab bars around toilet, Grab bars in shower, Other (Comment), Built-in shower seat Home Equipment: Environmental consultant - 2 wheels, Walker - 4 wheels, Shower seat, Grab bars - tub/shower, Hand held shower head, Wheelchair - manual, Wheelchair - power, Research scientist (life sciences)  Prior Device Use: Indicate devices/aids used by the patient prior to current illness, exacerbation or injury? Manual wheelchair  Prior Functional Level Prior Function Level of Independence: Independent with assistive device(s) Comments: WC level   Self Care: Did the patient need help bathing, dressing, using the toilet or eating?  Independent  Indoor Mobility: Did the patient need assistance with walking from room to room (with or without device)? Independent  Stairs: Did the patient need assistance with internal or external stairs (with or without device)? Independent  Functional Cognition: Did the patient need help planning regular tasks such as shopping or remembering to take medications? Independent  Current Functional Level Cognition  Overall Cognitive Status: Within Functional Limits for tasks assessed Orientation Level: Oriented X4    Extremity Assessment (includes Sensation/Coordination)  Upper Extremity Assessment: Defer to OT evaluation  Lower Extremity Assessment: RLE deficits/detail, LLE deficits/detail RLE Deficits / Details: NWB, ace wrap intact, wound vac intact RLE Sensation: history of peripheral neuropathy RLE Coordination: decreased gross motor LLE Deficits / Details: dressing and woud vac in place; high AKA - reports this as most painful    ADLs  Overall ADL's : Needs  assistance/impaired Eating/Feeding: Independent Grooming: Oral care, Wash/dry face, Set up, Sitting Grooming Details (indicate cue type and reason): in recliner Upper Body Bathing: Set up, Sitting Lower Body Bathing: Moderate assistance Upper Body Dressing : Set up Lower  Body Dressing: Maximal assistance Toilet Transfer: Minimal assistance, +2 for physical assistance, +2 for safety/equipment, Anterior/posterior Toilet Transfer Details (indicate cue type and reason): simualted through recliner transfer, bed pad used to assist, 3rd person helpful to manage leg Toileting- Clothing Manipulation and Hygiene: Maximal assistance Functional mobility during ADLs: Minimal assistance, +2 for physical assistance, +2 for safety/equipment(AP transfers as Pt NWB through BLE)    Mobility  Overal bed mobility: Needs Assistance Bed Mobility: Supine to Sit Supine to sit: Mod assist, +2 for physical assistance General bed mobility comments: mod A for initial trunk elevation, assist +2 for turning in the bed to prepare for AP transfer, use of bed pad to assist    Transfers  Overall transfer level: Needs assistance Transfers: Government social research officer transfers: Min assist, +2 physical assistance, +2 safety/equipment General transfer comment: use of bed pad to assist , Pt able to complete most of transfer with BUE and cues for hand placement - able to maintain NWB through BLE    Ambulation / Gait / Stairs / Office manager / Balance Dynamic Sitting Balance Sitting balance - Comments: min guard for safety Balance Overall balance assessment: Needs assistance Sitting-balance support: Bilateral upper extremity supported Sitting balance-Leahy Scale: Fair Sitting balance - Comments: min guard for safety    Special needs/care consideration BiPAP/CPAP yes CPM n/a Continuous Drip IV n/a Dialysis n/a Life Vest n/a Oxygen n/a Special Bed n/a Trach Size n/a Wound Vac  Yes Bilateral LE surgical incisions Skin bilateral surgical incisions to LE; surgical dressings; moisture associated skin damage to perineum and around anus Bowel mgmt: continent LBM 12/17 Bladder mgmt: continent Diabetic mgmt yes   Previous Home  Environment Living Arrangements: [name], fiance of 11 years)  Lives With: Significant other Available Help at Discharge: Family, Available 24 hours/day Type of Home: House Home Layout: One level Home Access: Ramped entrance Bathroom Shower/Tub: Multimedia programmer: Handicapped height Bathroom Accessibility: Yes How Accessible: Accessible via wheelchair [place]: No  Discharge Living Setting Plans for Discharge Living Setting: Patient's home, Lives with (comment)(fiance, Libby) Type of Home at Discharge: House Discharge Home Layout: One level Discharge Home Access: Ramped entrance Discharge Bathroom Shower/Tub: Walk-in shower Discharge Bathroom Toilet: Handicapped height Discharge Bathroom Accessibility: Yes How Accessible: Accessible via wheelchair Does the patient have any problems obtaining your medications?: No  Social/Family/Support Systems Patient Roles: Parent, Partner Contact Information: [name], daughter and Norva Karvonen Anticipated Caregiver: Hayden Pedro Anticipated Ambulance person Information: see above Ability/Limitations of Caregiver: no limitations Caregiver Availability: 24/7 Discharge Plan Discussed with Primary Caregiver: Yes Is Caregiver In Agreement with Plan?: Yes Does Caregiver/Family have Issues with Lodging/Transportation while Pt is in Rehab?: No  Fiance's Mom currently in [facility] after fall with hip fracture.  Goals/Additional Needs Patient/Family Goal for Rehab: Mod I with PT and OT at wheelchair level Expected length of stay: ELOS 7 to 10 days Equipment Needs: Bilateral wound VACS to BLE Pt/Family Agrees to Admission and willing to participate: Yes Program Orientation Provided & Reviewed with Pt/Caregiver Including Roles  & Responsibilities: Yes  Decrease burden of Care through IP rehab admission: n/a  Possible need for SNF placement upon discharge: not anticipated  Patient Condition: This patient's  condition remains as documented  in the consult dated 12/25/2017, in which the Rehabilitation Physician determined and documented that the patient's condition is appropriate for intensive rehabilitative care in an inpatient rehabilitation facility. Will admit to inpatient rehab today.  Preadmission Screen Completed By:  [name], 12/26/2017 10:58 AM ______________________________________________________________________   Discussed status with Dr. Posey Pronto on 12/26/2017 at  55 and received telephone approval for admission today.  Admission Coordinator:  [name], time 2671 Date 12/26/2017           Cosigned by: Jamse Arn, MD at 12/26/2017 12:10 PM  Revision History

## 2017-12-26 NOTE — Plan of Care (Signed)
  Problem: Education: Goal: Knowledge of General Education information will improve Description: Including pain rating scale, medication(s)/side effects and non-pharmacologic comfort measures Outcome: Progressing   Problem: Activity: Goal: Risk for activity intolerance will decrease Outcome: Progressing   Problem: Nutrition: Goal: Adequate nutrition will be maintained Outcome: Progressing   

## 2017-12-26 NOTE — Progress Notes (Signed)
Moisture associated skin damage noted to peri area around anus.  Skin care protocol started, cleansing with barrier cream, daily/prn.

## 2017-12-26 NOTE — Discharge Summary (Signed)
Physician Discharge [place] ZOX:[id number] DOB: Feb 16, 1950 DOA: 12/20/2017  PCP: [name], MD  Admit date: 12/20/2017 Discharge date: 12/26/2017  Time spent: 35 minutes  Recommendations for Outpatient Follow-up:  1. D/c to rehab for further management and modalities  Discharge Diagnoses:  Principal Problem:   Cellulitis of extremity Active Problems:   Hyperlipemia   Coronary atherosclerosis of native coronary artery   Venous (peripheral) insufficiency   Osteoarthritis, multiple sites   Obstructive sleep apnea   Type 2 diabetes mellitus with diabetic polyneuropathy, with long-term current use of insulin (HCC)   Left below-knee amputee (HCC)   PAD (peripheral artery disease) (HCC)   Chronic renal disease, stage III (HCC)   Cellulitis and abscess of left lower extremity   Subacute osteomyelitis, right ankle and foot ([place])   Discharge Condition: improved  Diet recommendation: diabetic hh  Filed Weights   12/20/17 1640 12/24/17 1154  Weight: 128.1 kg 128.1 kg    History of present illness:  67 male Known DM Ty ii, htn, L BKA Prior L BKA with Osteomyelitis Presented with Large purulent draining abscess of the L transtibial amputation and a necrotic R 5th metatarsal head Patient was seen by Dr. [name] and it was felt that patient would need AKA on L side  Surgery performed on 12/18   Hospital Course:  Sepsis and possible infection L transtibial AKA + infection of R 5th ray amputation--was on empiric Abx which were stopped on d/c--will need asa 325 for dvt prophyl--cont loveneox while at Rehab continue [place]  peripheral arterial disease Leukocytosis-transient and likely post-op related-no fever and stable for d/c Diabetes mellitus type 2-usually takes 60 units lantus-was on lower dosing in hospital-continue metformin OHSS on CPAP-continue CPAP nightly he has his own machine HTN-continue Coreg 25 twice daily-Hyzaar 50/12.5 was d/c on discharge to  rehab [place] AKI-SLight increase in creatinine to 1.27--recommended patient force fluids--resolve don d/c Probable BPH continue Flomax 0.4 BMI 36  Had amputation 12/18 by Dr. [name] Wound vac to be addressed as OP  Discharge Exam: Vitals:   12/26/17 0524 12/26/17 0824  BP: 128/63 124/67  Pulse: 79 91  Resp: 16   Temp: 98.7 F (37.1 C) 98.7 F (37.1 C)  SpO2: 98% 98%    General: awake alert pleasant good spirits Cardiovascular: s1 s 2no m/r/g Respiratory: clear  amputaiton areas bandaged  Discharge Instructions   Discharge Instructions    Diet - low sodium heart healthy   Complete by:  As directed    Increase activity slowly   Complete by:  As directed      Allergies as of 12/26/2017      Reactions   Atorvastatin Other (See Comments)   REACTION: myalgias   Ezetimibe Other (See Comments)   Body ache   Simvastatin Other (See Comments)   Body ache      Medication List    STOP taking these medications   ACCU-CHEK FASTCLIX LANCETS Misc   ACCU-CHEK GUIDE w/Device Kit   doxycycline 100 MG capsule Commonly known as:  VIBRAMYCIN   DROPLET PEN NEEDLES 31G X 5 MM Misc Generic drug:  Insulin Pen Needle   losartan-hydrochlorothiazide 50-12.5 MG tablet Commonly known as:  HYZAAR   potassium citrate 10 MEQ (1080 MG) SR tablet Commonly known as:  UROCIT-K     TAKE these medications   aspirin 325 MG tablet Take 325 mg by mouth daily.   carvedilol 25 MG tablet Commonly known as:  COREG TAKE 1 TABLET TWICE  DAILY What changed:  when to take this   enoxaparin 40 MG/0.4ML injection Commonly known as:  LOVENOX Inject 0.4 mLs (40 mg total) into the skin daily.   feeding supplement (PRO-STAT SUGAR FREE 64) Liqd Take 30 mLs by mouth 3 (three) times daily with meals.   glucose blood test strip Commonly known as:  ACCU-CHEK GUIDE Use as instructed to check sugar 3 times daily dx- E11.42   HYDROcodone-acetaminophen 5-325 MG tablet Commonly known as:  NORCO Take 1  tablet by mouth every 4 (four) hours as needed for moderate pain.   insulin aspart 100 UNIT/ML FlexPen Commonly known as:  NOVOLOG FLEXPEN INJECT&nbsp;&nbsp;30&nbsp;&nbsp;TO&nbsp;&nbsp;35 UNITS SUBCUTANEOUSLY THREE TIMES DAILY WITH MEALS What changed:    how much to take  how to take this  when to take this  additional instructions   Insulin Glargine 300 UNIT/ML Sopn Commonly known as:  TOUJEO SOLOSTAR Inject 60 Units into the skin daily.   metFORMIN 1000 MG tablet Commonly known as:  GLUCOPHAGE Take 1 tablet (1,000 mg total) by mouth daily. What changed:  when to take this   mupirocin ointment 2 % Commonly known as:  BACTROBAN Apply to wound daily What changed:    how much to take  how to take this  when to take this  reasons to take this   nitroGLYCERIN 0.2 mg/hr patch Commonly known as:  NITRODUR - Dosed in mg/24 hr PLACE 1 PATCH (0.2 MG TOTAL) DAILY ONTO THE SKIN. What changed:  additional instructions   ondansetron 4 MG tablet Commonly known as:  ZOFRAN Take 1 tablet (4 mg total) by mouth every 6 (six) hours as needed for nausea or vomiting.   pentoxifylline 400 MG CR tablet Commonly known as:  TRENTAL Take 1 tablet (400 mg total) by mouth 3 (three) times daily with meals.   polyethylene glycol packet Commonly known as:  MIRALAX / GLYCOLAX Take 17 g by mouth daily as needed for mild constipation.   PROBIOTIC DAILY PO Take 1 capsule by mouth daily.   tamsulosin 0.4 MG Caps capsule Commonly known as:  FLOMAX Take 1 capsule (0.4 mg total) by mouth daily.   triamcinolone 0.025 % cream Commonly known as:  KENALOG APPLY 1 APPLICATION TOPICALLY 3 (THREE) TIMES DAILY AS NEEDED. What changed:  See the new instructions.   vitamin C 500 MG tablet Commonly known as:  ASCORBIC ACID Take 500 mg by mouth 2 (two) times daily.   zinc gluconate 50 MG tablet Take 50 mg by mouth daily.      Allergies  Allergen Reactions  . Atorvastatin Other (See  Comments)    REACTION: myalgias  . Ezetimibe Other (See Comments)    Body ache  . Simvastatin Other (See Comments)    Body ache   Follow-up Information    [name], MD In 1 week.   Specialty:  Orthopedic Surgery Contact information: [place] [address] [phone]            The results of significant diagnostics from this hospitalization (including imaging, microbiology, ancillary and laboratory) are listed below for reference.    Significant Diagnostic Studies: Mr Foot Right W Wo Contrast  Result Date: 12/21/2017 CLINICAL DATA:  Right lateral foot wound near the fifth MTP joint. Evaluate for osteomyelitis. EXAM: MRI OF THE RIGHT FOREFOOT WITHOUT AND WITH CONTRAST TECHNIQUE: Multiplanar, multisequence MR imaging of the right forefoot was performed before and after the administration of intravenous contrast. CONTRAST:  10 mL Gadavist intravenous  contrast. COMPARISON:  Right foot x-rays from yesterday. FINDINGS: Despite efforts by the technologist and patient, motion artifact is present on today's exam and could not be eliminated. This reduces exam sensitivity and specificity. Bones/Joint/Cartilage Increased edema and enhancement with subtle early T1 marrow hypointensity involving the fifth metatarsal head and fifth proximal phalanx, consistent with osteomyelitis. No fracture or dislocation. Normal alignment. No joint effusion. Mild osteoarthritis of the first MTP joint and fourth tarsometatarsal joint. Ligaments Collateral ligaments are intact. Muscles and Tendons Flexor, peroneal and extensor compartment tendons are intact. Increased T2 signal and atrophy within the intrinsic muscles of the forefoot, nonspecific, but likely related to diabetic muscle changes. Soft tissue Small ulceration on the lateral forefoot adjacent to the fifth MTP joint extending close to the fifth metatarsal head. Mild dorsal forefoot soft tissue swelling. No fluid collection or  hematoma. No soft tissue mass. IMPRESSION: 1. Small ulceration on the lateral forefoot adjacent to the fifth MTP joint with underlying osteomyelitis of the fifth metatarsal head and fifth proximal phalanx. No abscess. Electronically Signed   By: Titus Dubin M.D.   On: 12/21/2017 22:30   Mr Tibia Fibula Left W Wo Contrast  Result Date: 12/21/2017 CLINICAL DATA:  Left below-knee amputation stump infection. Evaluate for osteomyelitis. EXAM: MRI OF LOWER LEFT EXTREMITY WITHOUT AND WITH CONTRAST TECHNIQUE: Multiplanar, multisequence MR imaging of the left proximal lower leg was performed both before and after administration of intravenous contrast. CONTRAST:  10 mL Gadavist intravenous contrast. COMPARISON:  Left knee x-rays dated December 20, 2017. FINDINGS: Bones/Joint/Cartilage Prior left below-knee amputation. There is subtle irregularity of the tibial stump with trace marrow edema and enhancement. Patchy red marrow in the distal femoral metaphysis. No fracture or dislocation. Normal alignment. No knee joint effusion. Moderate medial mild lateral compartment osteoarthritis. Ligaments Complex degenerative tearing of the medial meniscus body and posterior horn, as well as the lateral meniscus posterior horn. The cruciate and collateral ligaments are grossly intact. Muscles and Tendons Diffuse atrophy and mild edema of the visualized muscles in the proximal lower leg. Soft tissue There is a 4.2 x 7.2 x 4.1 cm rim enhancing fluid collection containing air in the anterior and medial aspect of the distal stump, draining to the skin anteriorly. The fluid collection abuts the tibial osteotomy. Moderate diffuse subcutaneous edema. IMPRESSION: 1. 7.2 cm soft tissue abscess of the below-knee amputation stump which drains to the skin anteriorly and abuts the tibial osteotomy site, with underlying bony signal changes suspicious for very early osteomyelitis. Electronically Signed   By: Titus Dubin M.D.   On:  12/21/2017 22:40   Dg Knee Complete 4 Views Left  Result Date: 12/20/2017 CLINICAL DATA:  LEFT lower extremity infection, purulent range and fever overnight, increased erythema, prior BKA EXAM: LEFT KNEE - COMPLETE 4+ VIEW COMPARISON:  None FINDINGS: Post BKA. Osseous demineralization. Joint space narrowing LEFT knee. No acute fracture, dislocation, or bone destruction. Soft tissue swelling at stump. No definite soft tissue gas. Scattered atherosclerotic calcifications. IMPRESSION: Osseous mineralization LEFT knee with degenerative changes and prior LEFT BKA. Soft tissue swelling at stump without acute osseous abnormalities. Electronically Signed   By: Lavonia Dana M.D.   On: 12/20/2017 11:50   Dg Foot Complete Right  Result Date: 12/20/2017 CLINICAL DATA:  Wound on lateral side of RIGHT foot, diabetes mellitus EXAM: RIGHT FOOT COMPLETE - 3+ VIEW COMPARISON:  None FINDINGS: Osseous demineralization. Joint spaces preserved. Soft tissue wound with small amount of gas identified at the lateral margin of the  fifth MTP joint. No fracture, dislocation, or bone destruction. Plantar and Achilles insertion calcaneal spur formation. IMPRESSION: No acute osseous abnormalities. Electronically Signed   By: Lavonia Dana M.D.   On: 12/20/2017 11:52   Ct Renal Stone Study  Result Date: 12/13/2017 CLINICAL DATA:  Right groin/flank pain.  Decreased urination. EXAM: CT ABDOMEN AND PELVIS WITHOUT CONTRAST TECHNIQUE: Multidetector CT imaging of the abdomen and pelvis was performed following the standard protocol without IV contrast. COMPARISON:  11/08/2016.  09/13/2014. FINDINGS: Lower chest: 2 mm pulmonary nodule posterior right lower lobe (15/4). 2 mm nodule seen in the left lower lobe on 10/04. both of these nodules were present on a scan from 09/13/2014 consistent with benign process such as scarring. Hepatobiliary: The liver shows diffusely decreased attenuation suggesting steatosis. No focal abnormality in the liver on  this study without intravenous contrast. Tiny calcified gallstones evident. No intrahepatic or extrahepatic biliary dilation. Pancreas: No focal mass lesion. No dilatation of the main duct. No intraparenchymal cyst. No peripancreatic edema. Spleen: No splenomegaly. No focal mass lesion. Adrenals/Urinary Tract: 13 mm right adrenal nodule stable since 09/13/2014. 2 cm nodule left adrenal gland also stable since 2016 and with low attenuation suggesting adenoma or myelolipoma. 1 mm nonobstructing stone seen in the lower pole the right kidney (coronal image 78/6). No stones identified in the right ureter. No secondary changes in the right kidney or ureter. 4-5 mm nonobstructing stone is seen in the lower pole the left kidney (coronal 73/6). 5-6 mm interpolar left renal stone seen on 81/6. No left ureteral stone. No secondary changes in the left kidney or ureter. No bladder stone. Stomach/Bowel: Stomach is nondistended. No gastric wall thickening. No evidence of outlet obstruction. Duodenum is normally positioned as is the ligament of Treitz. Large duodenal diverticulum evident. No small bowel wall thickening. No small bowel dilatation. The terminal ileum is normal. The appendix is not visualized, but there is no edema or inflammation in the region of the cecum. No gross colonic mass. No colonic wall thickening. Diverticuli are seen scattered along the entire length of the colon without CT findings of diverticulitis. Vascular/Lymphatic: There is abdominal aortic atherosclerosis without aneurysm. There is no gastrohepatic or hepatoduodenal ligament lymphadenopathy. No intraperitoneal or retroperitoneal lymphadenopathy. No pelvic sidewall lymphadenopathy. Upper normal lymph nodes are seen in the right common femoral region, presumably reactive. These are only minimally progressed compared to 11/08/2016. Reproductive: Brachytherapy seeds noted in the prostate gland. Other: No intraperitoneal free fluid. Musculoskeletal:  Bilateral groin hernias contain only fat. Degenerative changes are noted in the hips, right greater than left. No worrisome lytic or sclerotic osseous abnormality. IMPRESSION: 1. Bilateral nonobstructing nephrolithiasis. No ureteral or bladder stones. No secondary changes are seen in either kidney or ureter. 2. Cholelithiasis. 3. Bilateral small groin hernias contain only fat. 4.  Aortic Atherosclerois (ICD10-170.0) Electronically Signed   By: Misty Stanley M.D.   On: 12/13/2017 23:55    Microbiology: Recent Results (from the past 240 hour(s))  Blood culture (routine x 2)     Status: None   Collection Time: 12/20/17 10:14 AM  Result Value Ref Range Status   Specimen Description BLOOD RIGHT ANTECUBITAL  Final   Special Requests   Final    BOTTLES DRAWN AEROBIC AND ANAEROBIC Blood Culture results may not be optimal due to an excessive volume of blood received in culture bottles   Culture   Final    NO GROWTH 5 DAYS Performed at [facility], [place],  [place] [address]    Report Status 12/25/2017 FINAL  Final  Blood culture (routine x 2)     Status: None   Collection Time: 12/20/17 10:20 AM  Result Value Ref Range Status   Specimen Description BLOOD LEFT ANTECUBITAL  Final   Special Requests   Final    BOTTLES DRAWN AEROBIC AND ANAEROBIC Blood Culture results may not be optimal due to an excessive volume of blood received in culture bottles   Culture   Final    NO GROWTH 5 DAYS Performed at [facility], [place] [address], [place], [place] [address]    Report Status 12/25/2017 FINAL  Final  Surgical pcr screen     Status: Abnormal   Collection Time: 12/23/17 12:34 AM  Result Value Ref Range Status   MRSA, PCR NEGATIVE NEGATIVE Final   Staphylococcus aureus POSITIVE (A) NEGATIVE Final    Comment: (NOTE) The Xpert SA Assay (FDA approved for NASAL specimens in patients 10 years of age and older), is one component of a comprehensive surveillance program. It is not  intended to diagnose infection nor to guide or monitor treatment. Performed at [facility], [place] [address], [place], [place] [address]      Labs: Basic Metabolic Panel: Recent Labs  Lab 12/21/17 0417 12/22/17 0346 12/24/17 0222 12/25/17 0214 12/26/17 0606  NA 138 137 138 136 139  K 4.2 4.1 4.2 5.1 4.2  CL 102 101 104 103 105  CO2 '23 23 25 26 25  '$ GLUCOSE 153* 196* 228* 290* 149*  BUN '21 19 18 21 18  '$ CREATININE 1.13 1.12 1.15 1.27* 1.16  CALCIUM 8.8* 8.8* 8.7* 8.7* 8.6*   Liver Function Tests: Recent Labs  Lab 12/26/17 0606  AST 18  ALT 24  ALKPHOS 30*  BILITOT 0.6  PROT 5.8*  ALBUMIN 2.7*   No results for input(s): LIPASE, AMYLASE in the last 168 hours. No results for input(s): AMMONIA in the last 168 hours. CBC: Recent Labs  Lab 12/20/17 1014  12/21/17 0417 12/22/17 0346 12/24/17 0222 12/25/17 0214 12/26/17 0606  WBC 15.2*   < > 12.4* 12.4* 12.0* 22.4* 12.7*  NEUTROABS 12.0*  --   --   --   --  19.6* 10.1*  HGB 13.3   < > 12.3* 12.4* 12.2* 11.6* 10.0*  HCT 43.9   < > 40.0 38.9* 38.4* 36.5* 31.2*  MCV 86.4   < > 85.1 84.6 84.0 84.5 84.3  PLT 366   < > 334 297 290 323 262   < > = values in this interval not displayed.   Cardiac Enzymes: No results for input(s): CKTOTAL, CKMB, CKMBINDEX, TROPONINI in the last 168 hours. BNP: BNP (last 3 results) No results for input(s): BNP in the last 8760 hours.  ProBNP (last 3 results) No results for input(s): PROBNP in the last 8760 hours.  CBG: Recent Labs  Lab 12/25/17 0630 12/25/17 1130 12/25/17 1604 12/25/17 2102 12/26/17 0630  GLUCAP 210* 273* 311* 195* 143*       Signed:  Nita Sells MD   [facility] 12/26/2017, 10:43 AM

## 2017-12-26 NOTE — Progress Notes (Signed)
Subjective: 2 Days Post-Op Procedure(s) (LRB): LEFT ABOVE KNEE AMPUTATION (Left) RIGHT 5TH RAY AMPUTATION (Right) Patient reports pain as mild.    Objective: Vital signs in last 24 hours: Temp:  [97.9 F (36.6 C)-98.8 F (37.1 C)] 98.7 F (37.1 C) (12/20 0524) Pulse Rate:  [79-88] 79 (12/20 0524) Resp:  [16-20] 16 (12/20 0524) BP: (116-128)/(56-63) 128/63 (12/20 0524) SpO2:  [96 %-99 %] 98 % (12/20 0524)  Intake/Output from previous day: 12/19 0701 - 12/20 0700 In: 1320 [P.O.:1320] Out: 1525 [Urine:1500; Drains:25] Intake/Output this shift: No intake/output data recorded.  Recent Labs    12/24/17 0222 12/25/17 0214 12/26/17 0606  HGB 12.2* 11.6* 10.0*   Recent Labs    12/25/17 0214 12/26/17 0606  WBC 22.4* 12.7*  RBC 4.32 3.70*  HCT 36.5* 31.2*  PLT 323 262   Recent Labs    12/25/17 0214 12/26/17 0606  NA 136 139  K 5.1 4.2  CL 103 105  CO2 26 25  BUN 21 18  CREATININE 1.27* 1.16  GLUCOSE 290* 149*  CALCIUM 8.7* 8.6*   No results for input(s): LABPT, INR in the last 72 hours.  Left AKA with VAC dressing in place and functioning well. ~ 75 cc of serosanguinous appearing drainage in canister. Right foot VAC dressing in place and functioning well. No drainage observed in VAC canister   Assessment/Plan: 2 Days Post-Op Procedure(s) (LRB): LEFT ABOVE KNEE AMPUTATION (Left) RIGHT 5TH RAY AMPUTATION (Right) Planning for rehab following acute admission.  [place]

## 2017-12-26 NOTE — PMR Pre-admission (Signed)
PMR Admission Coordinator Pre-Admission Assessment  Patient: [name] is an 67 y.o., male MRN: [id number] DOB: [birth year] Height: 6' 2.02" (188 cm) Weight: 128.1 kg              Insurance Information HMO:     PPO:      PCP:      IPA:      80/20:      OTHER: no HMO PRIMARY: Medicare a and b      Policy#: [id number]      Subscriber: pt Benefits:  Phone #: passport one online     Name: 12/25/2017 Eff. Date: 05/07/2013     Deduct: $1364      Out of Pocket Max: none      Life Max: none CIR: 100%      SNF: 20 full days Outpatient: 80%     Co-Pay: 20% Home Health: 100%      Co-Pay: none DME: 80%     Co-Pay: 20% Providers: pt choice  SECONDARY: [employer]      Policy#: [id number]      Subscriber: pt  Medicaid Application Date:       Case Manager:  Disability Application Date:       Case Worker:   Emergency [place]    Name Relation Home Work [place] Son [phone]  [place] Significant other [phone]  [phone]   [name] Daughter   [phone]   Daishawn Lauf, Keng   [phone]     Current Medical History  Patient Admitting Diagnosis: left AKA , right 5th toe amputation  History of Present Illness: [name] is a 67 year old right-handed male with history of hypertension, diabetes mellitus, left BKA 2014 of which at that time was discharged directly home to receive home health therapies.  Patient initially with prosthesis after left BKA but later decreased wearing time due to ulceration of left stump. Presented 12/22/2017 with progressive ischemic changes of stump findings of osteomyelitis as well as findings of osteomyelitis right fifth metatarsal. Underwent left AKA, right fifth ray amputation 12/24/2017 per Dr. [name]. Hospital course pain management. Bilateral wound vac's applied. Subcutaneous Lovenox for DVT prophylaxis. Acute blood loss anemia 10.0 and monitored.     Past Medical History  Past  Medical History:  Diagnosis Date  . Coronary atherosclerosis of unspecified type of vessel, native or graft   . Diabetes mellitus without complication ([place])   . Hemorrhage of rectum and anus   . History of kidney stones   . Impotence of organic origin   . Internal hemorrhoids without mention of complication   . Malignant neoplasm of prostate ([place])   . Mononeuritis of unspecified site   . Osteoarthrosis, unspecified whether generalized or localized, unspecified site   . Other and unspecified hyperlipidemia   . Personal history of colonic polyps   . Personal history of diabetic foot ulcer    saw wound center, resolved 05/08/2010  . Personal history of gallstones   . Routine general medical examination at a health care facility   . Type II or unspecified type diabetes mellitus with neurological manifestations, not stated as uncontrolled(250.60)   . Unspecified glaucoma(365.9)   . Unspecified sleep apnea    cpcp  . Unspecified venous (peripheral) insufficiency     Family History  family history includes Heart attack in an other family member; Lung cancer in his father; Multiple sclerosis in his mother; Peripheral vascular disease in his maternal grandfather.  Prior Rehab/Hospitalizations:  Has the patient had major surgery during 100 days prior to admission? No  Current Medications   Current Facility-Administered Medications:  .  0.9 %  sodium chloride infusion, 250 mL, Intravenous, PRN, [name], MD .  0.9 %  sodium chloride infusion, , Intravenous, Continuous, [name], MD, Last Rate: 10 mL/hr at 12/24/17 1651 .  acetaminophen (TYLENOL) tablet 325-650 mg, 325-650 mg, Oral, Q6H PRN, [name], MD .  aspirin tablet 325 mg, 325 mg, Oral, Daily, [name], MD, 325 mg at 12/26/17 0911 .  bisacodyl (DULCOLAX) suppository 10 mg, 10 mg, Rectal, Daily PRN, [name], MD .  carvedilol (COREG) tablet 25 mg, 25 mg, Oral, BID WC, [name], MD, 25 mg at 12/26/17  0911 .  Chlorhexidine Gluconate Cloth 2 % PADS 6 each, 6 each, Topical, Daily, [name], MD, 6 each at 12/26/17 [phone]  docusate sodium (COLACE) capsule 100 mg, 100 mg, Oral, BID, [name], MD, 100 mg at 12/26/17 0911 .  enoxaparin (LOVENOX) injection 40 mg, 40 mg, Subcutaneous, Q24H, [name], MD, 40 mg at 12/25/17 1649 .  gadobutrol (GADAVIST) 1 MMOL/ML injection 10 mL, 10 mL, Intravenous, Once PRN, [name], MD .  HYDROcodone-acetaminophen (NORCO/VICODIN) 5-325 MG per tablet 1 tablet, 1 tablet, Oral, Q4H PRN, [name], MD, 1 tablet at 12/25/17 2112 .  HYDROmorphone (DILAUDID) injection 0.5-1 mg, 0.5-1 mg, Intravenous, Q4H PRN, [name], MD, 1 mg at 12/25/17 0441 .  insulin aspart (novoLOG) injection 0-15 Units, 0-15 Units, Subcutaneous, TID WC, [name], MD, 2 Units at 12/26/17 [phone] .  insulin glargine (LANTUS) injection 42 Units, 42 Units, Subcutaneous, Daily, Nita Sells, MD, 42 Units at 12/26/17 0912 .  magnesium citrate solution 1 Bottle, 1 Bottle, Oral, Once PRN, [name], MD .  methocarbamol (ROBAXIN) tablet 500 mg, 500 mg, Oral, Q6H PRN, 500 mg at 12/25/17 1017 **OR** methocarbamol (ROBAXIN) 500 mg in dextrose 5 % 50 mL IVPB, 500 mg, Intravenous, Q6H PRN, [name], MD .  metoCLOPramide (REGLAN) tablet 5-10 mg, 5-10 mg, Oral, Q8H PRN **OR** metoCLOPramide (REGLAN) injection 5-10 mg, 5-10 mg, Intravenous, Q8H PRN, [name], MD .  mupirocin ointment (BACTROBAN) 2 % 1 application, 1 application, Topical, Daily, [name], MD, 1 application at 42/35/36 1232 .  mupirocin ointment (BACTROBAN) 2 % 1 application, 1 application, Nasal, BID, [name], MD, 1 application at 14/43/15 [phone] .  ondansetron (ZOFRAN) tablet 4 mg, 4 mg, Oral, Q6H PRN, 4 mg at 12/24/17 1626 **OR** ondansetron (ZOFRAN) injection 4 mg, 4 mg, Intravenous, Q6H PRN, [name], MD .  oxyCODONE (Oxy IR/ROXICODONE) immediate release tablet 10-15 mg,  10-15 mg, Oral, Q4H PRN, [name], MD, 15 mg at 12/26/17 0911 .  oxyCODONE (Oxy IR/ROXICODONE) immediate release tablet 5-10 mg, 5-10 mg, Oral, Q4H PRN, [name], MD .  polyethylene glycol (MIRALAX / GLYCOLAX) packet 17 g, 17 g, Oral, Daily PRN, [name], MD .  sodium chloride (OCEAN) 0.65 % nasal spray 1 spray, 1 spray, Each Nare, PRN, Samtani, Jai-Gurmukh, MD .  sodium chloride flush (NS) 0.9 % injection 3 mL, 3 mL, Intravenous, Q12H, [name], MD, 3 mL at 12/26/17 0915 .  sodium chloride flush (NS) 0.9 % injection 3 mL, 3 mL, Intravenous, PRN, [name], MD .  tamsulosin [facility]) capsule 0.4 mg, 0.4 mg, Oral, Daily, Meridee Score  V, MD, 0.4 mg at 12/26/17 0911  Patients Current Diet:  Diet Order            Diet - low sodium heart healthy        Diet Carb Modified Fluid consistency: Thin; Room service appropriate? Yes  Diet effective now              Precautions / Restrictions Precautions Precautions: Fall Restrictions Weight Bearing Restrictions: Yes RLE Weight Bearing: Non weight bearing LLE Weight Bearing: Non weight bearing   Has the patient had 2 or more falls or a fall with injury in the past year?No  Prior Activity Level Limited Community (1-2x/wk): Mod I wheelchair level transfers  Development worker, international aid / [place] Devices/Equipment: Wheelchair, Grab bars around toilet, Grab bars in shower, Other (Comment), Built-in shower seat Home Equipment: Environmental consultant - 2 wheels, Walker - 4 wheels, Shower seat, Grab bars - tub/shower, Hand held shower head, Wheelchair - manual, Wheelchair - power, Research scientist (life sciences)  Prior Device Use: Indicate devices/aids used by the patient prior to current illness, exacerbation or injury? Manual wheelchair  Prior Functional Level Prior Function Level of Independence: Independent with assistive device(s) Comments: WC level   Self Care: Did the patient need help bathing, dressing, using the toilet or  eating?  Independent  Indoor Mobility: Did the patient need assistance with walking from room to room (with or without device)? Independent  Stairs: Did the patient need assistance with internal or external stairs (with or without device)? Independent  Functional Cognition: Did the patient need help planning regular tasks such as shopping or remembering to take medications? Independent  Current Functional Level Cognition  Overall Cognitive Status: Within Functional Limits for tasks assessed Orientation Level: Oriented X4    Extremity Assessment (includes Sensation/Coordination)  Upper Extremity Assessment: Defer to OT evaluation  Lower Extremity Assessment: RLE deficits/detail, LLE deficits/detail RLE Deficits / Details: NWB, ace wrap intact, wound vac intact RLE Sensation: history of peripheral neuropathy RLE Coordination: decreased gross motor LLE Deficits / Details: dressing and woud vac in place; high AKA - reports this as most painful    ADLs  Overall ADL's : Needs assistance/impaired Eating/Feeding: Independent Grooming: Oral care, Wash/dry face, Set up, Sitting Grooming Details (indicate cue type and reason): in recliner Upper Body Bathing: Set up, Sitting Lower Body Bathing: Moderate assistance Upper Body Dressing : Set up Lower Body Dressing: Maximal assistance Toilet Transfer: Minimal assistance, +2 for physical assistance, +2 for safety/equipment, Anterior/posterior Toilet Transfer Details (indicate cue type and reason): simualted through recliner transfer, bed pad used to assist, 3rd person helpful to manage leg Toileting- Clothing Manipulation and Hygiene: Maximal assistance Functional mobility during ADLs: Minimal assistance, +2 for physical assistance, +2 for safety/equipment(AP transfers as Pt NWB through BLE)    Mobility  Overal bed mobility: Needs Assistance Bed Mobility: Supine to Sit Supine to sit: Mod assist, +2 for physical assistance General bed  mobility comments: mod A for initial trunk elevation, assist +2 for turning in the bed to prepare for AP transfer, use of bed pad to assist    Transfers  Overall transfer level: Needs assistance Transfers: Government social research officer transfers: Min assist, +2 physical assistance, +2 safety/equipment General transfer comment: use of bed pad to assist , Pt able to complete most of transfer with BUE and cues for hand placement - able to maintain NWB through BLE    Ambulation / Gait / Stairs / Office manager / Balance  Dynamic Sitting Balance Sitting balance - Comments: min guard for safety Balance Overall balance assessment: Needs assistance Sitting-balance support: Bilateral upper extremity supported Sitting balance-Leahy Scale: Fair Sitting balance - Comments: min guard for safety    Special needs/care consideration BiPAP/CPAP yes CPM n/a Continuous Drip IV n/a Dialysis n/a Life Vest n/a Oxygen n/a Special Bed n/a Trach Size n/a Wound Vac  Yes Bilateral LE surgical incisions Skin bilateral surgical incisions to LE; surgical dressings; moisture associated skin damage to perineum and around anus Bowel mgmt: continent LBM 12/17 Bladder mgmt: continent Diabetic mgmt yes   Previous Home Environment Living Arrangements: [name], fiance of 11 years)  Lives With: Significant other Available Help at Discharge: Family, Available 24 hours/day Type of Home: House Home Layout: One level Home Access: Ramped entrance Bathroom Shower/Tub: Multimedia programmer: Handicapped height Bathroom Accessibility: Yes How Accessible: Accessible via wheelchair [place]: No  Discharge Living Setting Plans for Discharge Living Setting: Patient's home, Lives with (comment)(fiance, Libby) Type of Home at Discharge: House Discharge Home Layout: One level Discharge Home Access: Ramped entrance Discharge Bathroom Shower/Tub: Walk-in  shower Discharge Bathroom Toilet: Handicapped height Discharge Bathroom Accessibility: Yes How Accessible: Accessible via wheelchair Does the patient have any problems obtaining your medications?: No  Social/Family/Support Systems Patient Roles: Parent, Partner Contact Information: [name], daughter and Norva Karvonen Anticipated Caregiver: Hayden Pedro Anticipated Ambulance person Information: see above Ability/Limitations of Caregiver: no limitations Caregiver Availability: 24/7 Discharge Plan Discussed with Primary Caregiver: Yes Is Caregiver In Agreement with Plan?: Yes Does Caregiver/Family have Issues with Lodging/Transportation while Pt is in Rehab?: No  Fiance's Mom currently in [facility] after fall with hip fracture.  Goals/Additional Needs Patient/Family Goal for Rehab: Mod I with PT and OT at wheelchair level Expected length of stay: ELOS 7 to 10 days Equipment Needs: Bilateral wound VACS to BLE Pt/Family Agrees to Admission and willing to participate: Yes Program Orientation Provided & Reviewed with Pt/Caregiver Including Roles  & Responsibilities: Yes  Decrease burden of Care through IP rehab admission: n/a  Possible need for SNF placement upon discharge: not anticipated  Patient Condition: This patient's condition remains as documented in the consult dated 12/25/2017, in which the Rehabilitation Physician determined and documented that the patient's condition is appropriate for intensive rehabilitative care in an inpatient rehabilitation facility. Will admit to inpatient rehab today.  Preadmission Screen Completed By:  [name], 12/26/2017 10:58 AM ______________________________________________________________________   Discussed status with Dr. Posey Pronto on 12/26/2017 at  19 and received telephone approval for admission today.  Admission Coordinator:  [name], time 3419 Date 12/26/2017

## 2017-12-26 NOTE — Progress Notes (Signed)
Patient transfer to [id number], report called in to [facility]. IV d/c'd.

## 2017-12-26 NOTE — IPOC Note (Signed)
Overall Plan of Care [facility]) Patient Details Name: [name] MRN: [id number] DOB: 10-15-1950  Admitting Diagnosis: Left AKA with right 5th ray amputation  Hospital Problems: Active Problems:   Left above-knee amputee [facility])     Functional Problem List: Nursing Medication Management, Bowel, Pain, Safety  PT Balance, Sensory, Endurance, Motor, Pain, Skin Integrity  OT Balance, Safety, Endurance  SLP    TR         Basic ADL's: OT Bathing, Dressing, Toileting     Advanced  ADL's: OT Simple Meal Preparation     Transfers: PT Bed Mobility, Bed to Chair, Car  OT Toilet, Tub/Shower     Locomotion: PT Wheelchair Mobility     Additional Impairments: OT None  SLP        TR      Anticipated Outcomes Item Anticipated Outcome  Self Feeding no goal set  Swallowing      Basic self-care  mod I  Toileting  mod I   Bathroom Transfers mod I  Bowel/Bladder  manage bowel with mod I assist  Transfers  modI  Locomotion  modI w/c propulsion  Communication     Cognition     Pain  Pain at or below level 3  Safety/Judgment  Maintain safety with cues/reminders   Therapy Plan: PT Intensity: Minimum of 1-2 x/day ,45 to 90 minutes PT Frequency: 5 out of 7 days PT Duration Estimated Length of Stay: 7-10 days OT Intensity: Minimum of 1-2 x/day, 45 to 90 minutes OT Frequency: 5 out of 7 days OT Duration/Estimated Length of Stay: 7-10 days      Team Interventions: Nursing Interventions Bowel Management, Patient/Family Education, Pain Management, Skin Care/Wound Management, Medication Management, Discharge Planning  PT interventions Community reintegration, DME/adaptive equipment instruction, UE/LE Strength taining/ROM, Wheelchair propulsion/positioning, Psychosocial support, UE/LE Coordination activities, Therapeutic Activities, Skin care/wound management, Pain management, Discharge planning, Training and development officer, Disease management/prevention, Functional mobility  training, Patient/family education, Splinting/orthotics, Therapeutic Exercise  OT Interventions Training and development officer, Community reintegration, Disease mangement/prevention, Barrister's clerk education, Self Care/advanced ADL retraining, Therapeutic Exercise, UE/LE Coordination activities, Wheelchair propulsion/positioning, UE/LE Strength taining/ROM, Therapeutic Activities, Skin care/wound managment, Pain management, Psychosocial support, Functional mobility training, DME/adaptive equipment instruction, Discharge planning  SLP Interventions    TR Interventions    SW/CM Interventions Discharge Planning, Psychosocial Support, Patient/Family Education   Barriers to Discharge MD  Medical stability, Wound care and Weight bearing restrictions  Nursing      PT Weight bearing restrictions NWB BLE  OT      SLP      SW       Team Discharge Planning: Destination: PT-Home ,OT- Home , SLP-  Projected Follow-up: PT-Home health PT, 24 hour supervision/assistance, OT-  None, SLP-  Projected Equipment Needs: PT-Wheelchair (measurements), OT- None recommended by OT, SLP-  Equipment Details: PT-has w/c however broken down, armrest welded closed will impede ability to perform slideboard transfers safely, OT-  Patient/family involved in discharge planning: PT- Patient,  OT-Patient, SLP-   MD ELOS: 7--10 days. Medical Rehab Prognosis:  Good Assessment:  67 year old right-handed male with history of hypertension, diabetes mellitus, left BKA 2014 of which at that time was discharged directly home to receive home health therapies. Patient initially with prosthesis after left BKA but later decreased wearing time due to ulceration of left stump. Presented 12/22/2017 with progressive ischemic changes of stump findings of osteomyelitis as well as findings of osteomyelitis right fifth metatarsal. Underwent left AKA, right fifth ray amputation 12/24/2017 per Dr. [name]. Hospital course pain  management requiring IV  Dilaudid. Bilateral wound vacs applied. Acute blood loss anemia monitored. Patient with resulting functional deficits with mobility, transfers, self-care.  Will set goals for Mod I with PT/OT.   See Team Conference Notes for weekly updates to the plan of care

## 2017-12-26 NOTE — Progress Notes (Signed)
Physical Therapy Treatment Patient Details Name: [name] MRN: [id number] DOB: Oct 14, 1950 Today's Date: 12/26/2017    History of Present Illness Pt is a 67 y/o male PMH including Osteoarthrosis, Type II or unspecified type diabetes mellitus with neurological manifestations, glaucoma, sleep apnea, and Unspecified venous (peripheral) insufficiency,  Cardiac catheterization (1998); Cataract extraction (Right, 2017). Pt presents with with the diagnosis of Abscess, Osteomyelitis Left Below Knee Amputation and Osteomyelitis Right 5th Metatarsal Head.  Pt is now s/p L AKA, R 5th ray amputation.    PT Comments    Pt performed posterior scoot from bed to recliner with min assistance +2.  Pt is pleasant and motivated to advance and gain strength before return home.  Pt performed B LE strengthening with education on extending LLE as much as possible to avoid tightness in L hip flexors.  Pt remains an excellent cadidate for aggressive CIR therapies to improve strength and function.  Plan for d/c to CIR today.   Follow Up Recommendations  CIR     Equipment Recommendations  Other (comment)(defer)    Recommendations for Other Services       Precautions / Restrictions Precautions Precautions: Fall Restrictions Weight Bearing Restrictions: Yes RLE Weight Bearing: Non weight bearing LLE Weight Bearing: Non weight bearing    Mobility  Bed Mobility Overal bed mobility: Needs Assistance Bed Mobility: Supine to Sit     Supine to sit: Min assist;+2 for physical assistance     General bed mobility comments: Pt able to advance B LEs to edge of bed with tech managing wound vac leads.  Pt require min assistance to elevate trunk into sitting.  Able to maintain sitting balance edge of bed.    Transfers Overall transfer level: Needs assistance Equipment used: None Transfers: Comptroller transfers: Min assist;+2 physical assistance   General transfer  comment: Use of bed pad, patient with LOB posterior x1 after first scoot.  Able to maintain NWB during transfer.  Good upper extremity strength noted.    Ambulation/Gait                 Stairs             Wheelchair Mobility    Modified Rankin (Stroke Patients Only)       Balance Overall balance assessment: Needs assistance   Sitting balance-Leahy Scale: Fair Sitting balance - Comments: min guard for safety                                    Cognition Arousal/Alertness: Awake/alert Behavior During Therapy: WFL for tasks assessed/performed Overall Cognitive Status: Within Functional Limits for tasks assessed                                        Exercises Total Joint Exercises Ankle Circles/Pumps: AROM;Right;10 reps;Supine Quad Sets: AROM;Right;10 reps;Supine Heel Slides: AROM;Right;10 reps;Supine Hip ABduction/ADduction: AROM;Both;10 reps;Supine Straight Leg Raises: AROM;Both;10 reps;Supine Amputee Exercises Hip Extension: AROM;Left;10 reps;Sidelying Hip ABduction/ADduction: AROM;Left;Sidelying;10 reps    General Comments        Pertinent Vitals/Pain Pain Assessment: 0-10 Pain Score: 6  Pain Location: LLE Pain Descriptors / Indicators: Discomfort;Grimacing;Guarding;Sore;Throbbing Pain Intervention(s): Monitored during session;Repositioned    Home Living   Living Arrangements: [name], fiance of 11 years)  Home Equipment: [place] - 2 wheels;Walker - 4 wheels;Shower seat;Grab bars - tub/shower;Hand held shower head;Wheelchair - Education officer, community - power;Adaptive equipment      Prior Function            PT Goals (current goals can now be found in the care plan section) Acute Rehab PT Goals Patient Stated Goal: get back to traveling Potential to Achieve Goals: Good Progress towards PT goals: Progressing toward goals    Frequency    Min 3X/week      PT Plan Current plan remains appropriate     Co-evaluation              AM-PAC PT "6 Clicks" Mobility   Outcome Measure  Help needed turning from your back to your side while in a flat bed without using bedrails?: A Lot Help needed moving from lying on your back to sitting on the side of a flat bed without using bedrails?: A Little Help needed moving to and from a bed to a chair (including a wheelchair)?: A Little Help needed standing up from a chair using your arms (e.g., wheelchair or bedside chair)?: Total Help needed to walk in hospital room?: Total Help needed climbing 3-5 steps with a railing? : Total 6 Click Score: 11    End of Session Equipment Utilized During Treatment: Gait belt Activity Tolerance: Patient tolerated treatment well Patient left: in chair;with call bell/phone within reach;with chair alarm set Nurse Communication: Mobility status PT Visit Diagnosis: Unsteadiness on feet (R26.81);Other abnormalities of gait and mobility (R26.89);Muscle weakness (generalized) (M62.81)     Time: 6659-9357 PT Time Calculation (min) (ACUTE ONLY): 23 min  Charges:  $Therapeutic Exercise: 8-22 mins $Therapeutic Activity: 8-22 mins                     Governor Rooks, PTA Acute Rehabilitation Services Pager [phone] Office [phone]     Jerrika Ledlow Eli Hose 12/26/2017, 12:34 PM

## 2017-12-27 ENCOUNTER — Inpatient Hospital Stay (HOSPITAL_COMMUNITY): Payer: Medicare Other

## 2017-12-27 ENCOUNTER — Inpatient Hospital Stay (HOSPITAL_COMMUNITY): Payer: Medicare Other | Admitting: Physical Therapy

## 2017-12-27 DIAGNOSIS — E1040 Type 1 diabetes mellitus with diabetic neuropathy, unspecified: Secondary | ICD-10-CM

## 2017-12-27 DIAGNOSIS — Z89612 Acquired absence of left leg above knee: Secondary | ICD-10-CM

## 2017-12-27 DIAGNOSIS — K5901 Slow transit constipation: Secondary | ICD-10-CM

## 2017-12-27 LAB — CBC WITH DIFFERENTIAL/PLATELET
Abs Immature Granulocytes: 0.39 10*3/uL — ABNORMAL HIGH (ref 0.00–0.07)
Basophils Absolute: 0.1 10*3/uL (ref 0.0–0.1)
Basophils Relative: 1 %
Eosinophils Absolute: 0.2 10*3/uL (ref 0.0–0.5)
Eosinophils Relative: 2 %
HCT: 30.5 % — ABNORMAL LOW (ref 39.0–52.0)
Hemoglobin: 9.9 g/dL — ABNORMAL LOW (ref 13.0–17.0)
Immature Granulocytes: 4 %
Lymphocytes Relative: 11 %
Lymphs Abs: 1.2 10*3/uL (ref 0.7–4.0)
MCH: 27.6 pg (ref 26.0–34.0)
MCHC: 32.5 g/dL (ref 30.0–36.0)
MCV: 85 fL (ref 80.0–100.0)
MONOS PCT: 6 %
Monocytes Absolute: 0.6 10*3/uL (ref 0.1–1.0)
Neutro Abs: 7.9 10*3/uL — ABNORMAL HIGH (ref 1.7–7.7)
Neutrophils Relative %: 76 %
Platelets: 252 10*3/uL (ref 150–400)
RBC: 3.59 MIL/uL — ABNORMAL LOW (ref 4.22–5.81)
RDW: 16.5 % — ABNORMAL HIGH (ref 11.5–15.5)
WBC: 10.4 10*3/uL (ref 4.0–10.5)
nRBC: 0 % (ref 0.0–0.2)

## 2017-12-27 LAB — COMPREHENSIVE METABOLIC PANEL
ALT: 20 U/L (ref 0–44)
ANION GAP: 10 (ref 5–15)
AST: 15 U/L (ref 15–41)
Albumin: 2.6 g/dL — ABNORMAL LOW (ref 3.5–5.0)
Alkaline Phosphatase: 31 U/L — ABNORMAL LOW (ref 38–126)
BUN: 19 mg/dL (ref 8–23)
CO2: 25 mmol/L (ref 22–32)
Calcium: 8.4 mg/dL — ABNORMAL LOW (ref 8.9–10.3)
Chloride: 104 mmol/L (ref 98–111)
Creatinine, Ser: 1 mg/dL (ref 0.61–1.24)
GFR calc Af Amer: >60 mL/min (ref >60)
GFR calc non Af Amer: >60 mL/min (ref >60)
Glucose, Bld: 147 mg/dL — ABNORMAL HIGH (ref 70–99)
Potassium: 4.2 mmol/L (ref 3.5–5.1)
Sodium: 139 mmol/L (ref 135–145)
Total Bilirubin: 0.5 mg/dL (ref 0.3–1.2)
Total Protein: 5.8 g/dL — ABNORMAL LOW (ref 6.5–8.1)

## 2017-12-27 MED ORDER — LINACLOTIDE 145 MCG PO CAPS
145.0000 ug | ORAL_CAPSULE | Freq: Every day | ORAL | Status: DC
  Administered 2017-12-28 – 2018-01-02 (×6): 145 ug via ORAL
  Filled 2017-12-27 (×7): qty 1

## 2017-12-27 NOTE — Progress Notes (Signed)
Occupational Therapy Session Note  Patient Details  Name: [name] MRN: [id number] Date of Birth: 1950/09/01  Today's Date: 12/27/2017 OT Individual Time: 1430-1530 OT Individual Time Calculation (min): 60 min    Short Term Goals: Week 1:  OT Short Term Goal 1 (Week 1): Pt will complete lateral scoot to BSC with (S) OT Short Term Goal 2 (Week 1): Pt will complete peri hygiene via lateral leans with (S) on BSC OT Short Term Goal 3 (Week 1): Pt will don LB clothing EOB with (S)   Skilled Therapeutic Interventions/Progress Updates:  Session focused on toileting tasks. Pt completed lateral scoot with slideboard to bari BSC with mod A, going uphill on slideboard. Mod cueing for UE placement and positioning. Pt able to doff pants with lateral leans with cueing only. Pt was given several minutes of privacy to attempt and simulate bowels. Pt was able to have BM and then completed peri hygiene with CGA during lateral leans. Rest breaks required during wiping. Pt used slideboard to transfer back to w/c with min A and then into bed in similar fashion. Pt was reminded of pressure relief and residual limb positioning. Pt was left supine in bed with bed alarm set and all needs met.   Therapy Documentation Precautions:  Precautions Precautions: Fall Precaution Comments: wound vacs BLE Restrictions Weight Bearing Restrictions: Yes RLE Weight Bearing: Non weight bearing LLE Weight Bearing: Non weight bearing General: General Chart Reviewed: Yes Family/Caregiver Present: No Vital Signs: Therapy Vitals Temp: 98.1 F (36.7 C) Temp Source: Oral Pulse Rate: 77 Resp: 16 BP: (!) 124/54 Patient Position (if appropriate): Sitting Oxygen Therapy SpO2: 96 % O2 Device: Room Air Pain: Pain Assessment Pain Scale: 0-10 Pain Score: 5  Pain Type: Acute pain Pain Location: Leg Pain Orientation: Right Pain Descriptors / Indicators: Aching Pain Frequency: Intermittent Pain Onset: On-going Pain  Intervention(s): Medication (See eMAR);Emotional support   Therapy/Group: Individual Therapy  Curtis Sites 12/27/2017, 3:49 PM

## 2017-12-27 NOTE — Evaluation (Signed)
Occupational Therapy Assessment and Plan  Patient Details  Name: [name] MRN: [id number] Date of Birth: 09-Jul-1950  OT Diagnosis: muscle weakness (generalized) Rehab Potential: Rehab Potential (ACUTE ONLY): Good ELOS: 7-10 days   Today's Date: 12/27/2017 OT Individual Time: 0800-0900 OT Individual Time Calculation (min): 60 min     Problem List:  Patient Active Problem List   Diagnosis Date Noted  . Left above-knee amputee ([place]) 12/26/2017  . Unilateral AKA, left ([place])   . History of complete ray amputation of fifth toe of right foot ([place])   . Postoperative pain   . Acute blood loss anemia   . Essential hypertension   . Poorly controlled type 2 diabetes mellitus with peripheral neuropathy ([place])   . Subacute osteomyelitis, right ankle and foot ([place])   . Cellulitis of extremity 12/20/2017  . Cellulitis and abscess of left lower extremity 12/20/2017  . PAD (peripheral artery disease) ([place]) 02/19/2017  . Chronic renal disease, stage III ([place]) 02/19/2017  . Morbid obesity ([place]) 11/05/2016  . Ulcer of toe of right foot, limited to breakdown of skin ([place]) 10/11/2016  . Non-pressure chronic ulcer of left calf, limited to breakdown of skin ([place]) 06/04/2016  . Idiopathic chronic venous hypertension of right lower extremity with ulcer and inflammation ([place]) 06/04/2016  . Left below-knee amputee ([place]) 02/23/2016  . Type 2 diabetes mellitus with diabetic polyneuropathy, with long-term current use of insulin ([place]) 03/29/2015  . Advance directive discussed with patient 02/07/2014  . Normocytic anemia 04/01/2013  . Diabetic Charcot foot ([place]) 04/01/2013  . Routine general medical examination at a health care facility 12/25/2010  . HEMORRHOIDS-INTERNAL 11/22/2009  . PERSONAL HX COLONIC POLYPS 11/22/2009  . Venous (peripheral) insufficiency 06/29/2008  . Osteoarthritis, multiple sites 04/03/2007  . ERECTILE DYSFUNCTION, ORGANIC 06/13/2006  . Hyperlipemia 06/11/2006  . GLAUCOMA  06/11/2006  . Coronary atherosclerosis of native coronary artery 06/11/2006  . Obstructive sleep apnea 06/11/2006    Past Medical History:  Past Medical History:  Diagnosis Date  . Coronary atherosclerosis of unspecified type of vessel, native or graft   . Diabetes mellitus without complication ([place])   . Hemorrhage of rectum and anus   . History of kidney stones   . Impotence of organic origin   . Internal hemorrhoids without mention of complication   . Malignant neoplasm of prostate ([place])   . Mononeuritis of unspecified site   . Osteoarthrosis, unspecified whether generalized or localized, unspecified site   . Other and unspecified hyperlipidemia   . Personal history of colonic polyps   . Personal history of diabetic foot ulcer    saw wound center, resolved 05/08/2010  . Personal history of gallstones   . Routine general medical examination at a health care facility   . Type II or unspecified type diabetes mellitus with neurological manifestations, not stated as uncontrolled(250.60)   . Unspecified glaucoma(365.9)   . Unspecified sleep apnea    cpcp  . Unspecified venous (peripheral) insufficiency    Past Surgical History:  Past Surgical History:  Procedure Laterality Date  . AMPUTATION Left 12/30/2012   Procedure: AMPUTATION RAY ;  Surgeon: Meredith Pel, MD;  Location: [facility];  Service: Orthopedics;  Laterality: Left;  LEFT GREAT TOE RAY AMPUTATION  . AMPUTATION Left 02/23/2016   Procedure: Left Below Knee Amputation;  Surgeon: [name], MD;  Location: [facility];  Service: Orthopedics;  Laterality: Left;  . AMPUTATION Left 12/24/2017   Procedure: LEFT ABOVE KNEE AMPUTATION;  Surgeon: [name],  MD;  Location: [facility];  Service: Orthopedics;  Laterality: Left;  . AMPUTATION Right 12/24/2017   Procedure: RIGHT 5TH RAY AMPUTATION;  Surgeon: [name], MD;  Location: [facility];  Service: Orthopedics;  Laterality: Right;  . APPENDECTOMY    . BASAL CELL CARCINOMA EXCISION   2/16   left forearm  . CARDIAC CATHETERIZATION  1998   Negative  . CATARACT EXTRACTION Right 2017   then lid surgery  . CORONARY ARTERY BYPASS GRAFT  09/2005   Post op AFIB  . EYE SURGERY    . FOOT BONE EXCISION Left 11/03/2013   DR DUDA   . I&D EXTREMITY Left 11/03/2013   Procedure: Left Foot Partial Bone Excision Cuboid and Medial Cuneiform, Wound Closures;  Surgeon: [name], MD;  Location: [facility];  Service: Orthopedics;  Laterality: Left;  . INSERTION PROSTATE RADIATION SEED  2009   RT and seeds for prostate cancer  . KIDNEY STONE SURGERY  04/1993  . RCA stents  04/2003   EF 55%  . RETINAL DETACHMENT SURGERY  2002-2003  . thrombosed vein  1993   Right leg    Assessment & Plan Clinical Impression: [name] is a 67 year old right-handed male with history of hypertension, diabetes mellitus, left BKA 2014 of which at that time was discharged directly home to receive home health therapies. Per chart review and patient, lives with girlfriend. One level home with ramped entrance. Patient with assistance as needed. Patient initially with prosthesis after left BKA but later decreased wearing time due to ulceration of left stump. Presented 12/22/2017 with progressive ischemic changes of stump findings of osteomyelitis as well as findings of osteomyelitis right fifth metatarsal. Underwent left AKA, right fifth ray amputation 12/24/2017 per Dr. [name]. Hospital course pain management requiring IV Dilaudid. Bilateral wound vacs applied. Subcutaneous Lovenox for DVT prophylaxis. Acute blood loss anemia 10.0 and monitored. Therapy evaluations completed with recommendations of physical medicine rehabilitation consult. Patient was admitted for a comprehensive  rehabilitation program  Patient transferred to CIR on 12/26/2017 .    Patient currently requires min with basic self-care skills and IADL secondary to muscle weakness, decreased oxygen support and difficulty maintaining precautions.  Prior to  hospitalization, patient could complete ADLs with modified independent .  Patient will benefit from skilled intervention to increase independence with basic self-care skills and increase level of independence with iADL prior to discharge home with care partner.  Anticipate patient will require intermittent (S) with IADLs, but will complete ADLs with modified independence.   OT - End of Session Activity Tolerance: Tolerates 10 - 20 min activity with multiple rests Endurance Deficit: Yes Endurance Deficit Description: fatigues quickly with mobility activities OT Assessment Rehab Potential (ACUTE ONLY): Good OT Patient demonstrates impairments in the following area(s): Balance;Safety;Endurance OT Basic ADL's Functional Problem(s): Bathing;Dressing;Toileting OT Advanced ADL's Functional Problem(s): Simple Meal Preparation OT Transfers Functional Problem(s): Toilet;Tub/Shower OT Additional Impairment(s): None OT Plan OT Intensity: Minimum of 1-2 x/day, 45 to 90 minutes OT Frequency: 5 out of 7 days OT Duration/Estimated Length of Stay: 7-10 days OT Treatment/Interventions: Balance/vestibular training;Community reintegration;Disease mangement/prevention;Patient/family education;Self Care/advanced ADL retraining;Therapeutic Exercise;UE/LE Coordination activities;Wheelchair propulsion/positioning;UE/LE Strength taining/ROM;Therapeutic Activities;Skin care/wound managment;Pain management;Psychosocial support;Functional mobility training;DME/adaptive equipment instruction;Discharge planning OT Self Feeding Anticipated Outcome(s): no goal set OT Basic Self-Care Anticipated Outcome(s): mod I OT Toileting Anticipated Outcome(s): mod I OT Bathroom Transfers Anticipated Outcome(s): mod I OT Recommendation Recommendations for Other Services: Therapeutic Recreation consult Therapeutic Recreation Interventions: [place] group Patient destination: Home Follow Up Recommendations:  None Equipment Recommended:  None recommended by OT   Skilled Therapeutic Intervention Pt seen for skilled OT evaluation. Education provided re. OT POC, ELOS, precautions/condition insight, and rehab expectations. Pt completed b/d EOB as described below. Min A provided for LB dressing, with management of wound vacs and positioning. Pt requiring moderate vc for NWB RLE during bed mobility and instruction on other positioning. Discussed d/c planning and adjustment with new AKA. Pt completed lateral scoot to w/c toward L with min A, +2 assistance for equipment management. Pt was left sitting up in w/c with chair alarm belt fastened and all needs met.   OT Evaluation Precautions/Restrictions  Precautions Precautions: Fall Precaution Comments: wound vacs BLE Restrictions Weight Bearing Restrictions: Yes RLE Weight Bearing: Non weight bearing LLE Weight Bearing: Non weight bearing General Chart Reviewed: Yes Family/Caregiver Present: No Pain Pain Assessment Pain Scale: 0-10 Pain Score: 0-No pain Home Living/Prior Functioning Home Living Family/patient expects to be discharged to:: Private residence Living Arrangements: Spouse/significant other Available Help at Discharge: Family, Available 24 hours/day Type of Home: House Home Access: Ramped entrance Home Layout: One level Bathroom Shower/Tub: Multimedia programmer: Handicapped height Bathroom Accessibility: Yes  Lives With: Significant other IADL History Homemaking Responsibilities: Yes Meal Prep Responsibility: Primary Laundry Responsibility: Secondary Cleaning Responsibility: Secondary Bill Paying/Finance Responsibility: Secondary Shopping Responsibility: Secondary Homemaking Comments: Pt and his fiancee split housework- he does most of the cooking Occupation: Retired Prior Function Level of Independence: Independent with homemaking with wheelchair, Independent with transfers, Independent with basic ADLs  Able to [place]?: No Driving:  No Vocation: Retired Comments: WC level d/t ongoing issues with wounds leading up to admission ADL ADL Eating: Independent Where Assessed-Eating: Edge of bed Grooming: Setup Where Assessed-Grooming: Edge of bed Upper Body Bathing: Supervision/safety Where Assessed-Upper Body Bathing: Edge of bed Lower Body Bathing: Minimal cueing, Minimal assistance Where Assessed-Lower Body Bathing: Edge of bed Upper Body Dressing: Setup Where Assessed-Upper Body Dressing: Edge of bed Lower Body Dressing: Minimal cueing, Minimal assistance Where Assessed-Lower Body Dressing: Bed level Toileting: Minimal assistance Where Assessed-Toileting: Bedside Commode Toilet Transfer: Minimal assistance, Moderate verbal cueing Toilet Transfer Method: Theatre manager: Extra wide bedside commode Tub/Shower Transfer: Unable to assess(not cleared to shower) Vision Baseline Vision/History: Wears glasses;Cataracts Wears Glasses: Reading only Patient Visual Report: No change from baseline Vision Assessment?: No apparent visual deficits Perception  Perception: Within Functional Limits Praxis Praxis: Intact Cognition Overall Cognitive Status: Within Functional Limits for tasks assessed Arousal/Alertness: Awake/alert Orientation Level: Person;Situation;Place Person: Oriented Place: Oriented Situation: Oriented Year: 2019 Month: December Day of Week: Correct Memory: Appears intact Immediate Memory Recall: Sock;Blue;Bed Memory Recall: Sock;Blue Memory Recall Sock: Without Cue Memory Recall Blue: Without Cue Attention: Selective Selective Attention: Appears intact Awareness: Appears intact Problem Solving: Appears intact Safety/Judgment: Appears intact Comments: hard of hearing Sensation Sensation Light Touch: Impaired Detail Light Touch Impaired Details: Impaired LLE Hot/Cold: Appears Intact Proprioception: Appears Intact Coordination Gross Motor Movements are Fluid and  Coordinated: No Fine Motor Movements are Fluid and Coordinated: Yes Finger Nose Finger Test: [facility] Heel Shin Test: NT d/t amputation Motor  Motor Motor: Other (comment) Motor - Skilled Clinical Observations: generalized weakness Mobility  Bed Mobility Bed Mobility: Sit to Supine;Rolling Right;Rolling Left;Supine to Sit Rolling Right: Supervision/verbal cueing Rolling Left: Supervision/Verbal cueing Supine to Sit: Supervision/Verbal cueing Sit to Supine: Supervision/Verbal cueing  Trunk/Postural Assessment  Cervical Assessment Cervical Assessment: Within Functional Limits Thoracic Assessment Thoracic Assessment: Within Functional Limits Lumbar Assessment Lumbar Assessment: Within Functional Limits Postural  Control Postural Control: Within Functional Limits  Balance Balance Balance Assessed: Yes Static Sitting Balance Static Sitting - Balance Support: No upper extremity supported;Feet unsupported Static Sitting - Level of Assistance: 6: Modified independent (Device/Increase time) Dynamic Sitting Balance Dynamic Sitting - Balance Support: No upper extremity supported;Feet unsupported Dynamic Sitting - Level of Assistance: 5: Stand by assistance Dynamic Sitting - Balance Activities: Reaching for objects Extremity/Trunk Assessment RUE Assessment RUE Assessment: Within Functional Limits LUE Assessment LUE Assessment: Within Functional Limits     Refer to Care Plan for Long Term Goals  Recommendations for other services: Therapeutic Recreation  Kitchen group   Discharge Criteria: Patient will be discharged from OT if patient refuses treatment 3 consecutive times without medical reason, if treatment goals not met, if there is a change in medical status, if patient makes no progress towards goals or if patient is discharged from hospital.  The above assessment, treatment plan, treatment alternatives and goals were discussed and mutually agreed upon: by patient  Curtis Sites 12/27/2017, 12:31 PM

## 2017-12-27 NOTE — Evaluation (Signed)
Physical Therapy Assessment and Plan  Patient Details  Name: [name] MRN: [id number] Date of Birth: Jul 28, 1950  PT Diagnosis: Edema, Impaired sensation and Pain in L residual limb, R foot at amputation site Rehab Potential: Good ELOS: 7-10 days   Today's Date: 12/27/2017 PT Individual Time: 1000-1100 PT Individual Time Calculation (min): 60 min    Problem List:  Patient Active Problem List   Diagnosis Date Noted  . Left above-knee amputee ([place]) 12/26/2017  . Unilateral AKA, left ([place])   . History of complete ray amputation of fifth toe of right foot ([place])   . Postoperative pain   . Acute blood loss anemia   . Essential hypertension   . Poorly controlled type 2 diabetes mellitus with peripheral neuropathy ([place])   . Subacute osteomyelitis, right ankle and foot ([place])   . Cellulitis of extremity 12/20/2017  . Cellulitis and abscess of left lower extremity 12/20/2017  . PAD (peripheral artery disease) ([place]) 02/19/2017  . Chronic renal disease, stage III ([place]) 02/19/2017  . Morbid obesity ([place]) 11/05/2016  . Ulcer of toe of right foot, limited to breakdown of skin ([place]) 10/11/2016  . Non-pressure chronic ulcer of left calf, limited to breakdown of skin ([place]) 06/04/2016  . Idiopathic chronic venous hypertension of right lower extremity with ulcer and inflammation ([place]) 06/04/2016  . Left below-knee amputee ([place]) 02/23/2016  . Type 2 diabetes mellitus with diabetic polyneuropathy, with long-term current use of insulin ([place]) 03/29/2015  . Advance directive discussed with patient 02/07/2014  . Normocytic anemia 04/01/2013  . Diabetic Charcot foot ([place]) 04/01/2013  . Routine general medical examination at a health care facility 12/25/2010  . HEMORRHOIDS-INTERNAL 11/22/2009  . PERSONAL HX COLONIC POLYPS 11/22/2009  . Venous (peripheral) insufficiency 06/29/2008  . Osteoarthritis, multiple sites 04/03/2007  . ERECTILE DYSFUNCTION, ORGANIC 06/13/2006  . Hyperlipemia 06/11/2006  .  GLAUCOMA 06/11/2006  . Coronary atherosclerosis of native coronary artery 06/11/2006  . Obstructive sleep apnea 06/11/2006    Past Medical History:  Past Medical History:  Diagnosis Date  . Coronary atherosclerosis of unspecified type of vessel, native or graft   . Diabetes mellitus without complication ([place])   . Hemorrhage of rectum and anus   . History of kidney stones   . Impotence of organic origin   . Internal hemorrhoids without mention of complication   . Malignant neoplasm of prostate ([place])   . Mononeuritis of unspecified site   . Osteoarthrosis, unspecified whether generalized or localized, unspecified site   . Other and unspecified hyperlipidemia   . Personal history of colonic polyps   . Personal history of diabetic foot ulcer    saw wound center, resolved 05/08/2010  . Personal history of gallstones   . Routine general medical examination at a health care facility   . Type II or unspecified type diabetes mellitus with neurological manifestations, not stated as uncontrolled(250.60)   . Unspecified glaucoma(365.9)   . Unspecified sleep apnea    cpcp  . Unspecified venous (peripheral) insufficiency    Past Surgical History:  Past Surgical History:  Procedure Laterality Date  . AMPUTATION Left 12/30/2012   Procedure: AMPUTATION RAY ;  Surgeon: Meredith Pel, MD;  Location: [facility];  Service: Orthopedics;  Laterality: Left;  LEFT GREAT TOE RAY AMPUTATION  . AMPUTATION Left 02/23/2016   Procedure: Left Below Knee Amputation;  Surgeon: [name], MD;  Location: [place];  Service: Orthopedics;  Laterality: Left;  . AMPUTATION Left 12/24/2017   Procedure: LEFT ABOVE KNEE  AMPUTATION;  Surgeon: [name], MD;  Location: [facility];  Service: Orthopedics;  Laterality: Left;  . AMPUTATION Right 12/24/2017   Procedure: RIGHT 5TH RAY AMPUTATION;  Surgeon: [name], MD;  Location: [place];  Service: Orthopedics;  Laterality: Right;  . APPENDECTOMY    . BASAL CELL CARCINOMA  EXCISION  2/16   left forearm  . CARDIAC CATHETERIZATION  1998   Negative  . CATARACT EXTRACTION Right 2017   then lid surgery  . CORONARY ARTERY BYPASS GRAFT  09/2005   Post op AFIB  . EYE SURGERY    . FOOT BONE EXCISION Left 11/03/2013   DR DUDA   . I&D EXTREMITY Left 11/03/2013   Procedure: Left Foot Partial Bone Excision Cuboid and Medial Cuneiform, Wound Closures;  Surgeon: [name], MD;  Location: [facility];  Service: Orthopedics;  Laterality: Left;  . INSERTION PROSTATE RADIATION SEED  2009   RT and seeds for prostate cancer  . KIDNEY STONE SURGERY  04/1993  . RCA stents  04/2003   EF 55%  . RETINAL DETACHMENT SURGERY  2002-2003  . thrombosed vein  1993   Right leg    Assessment & Plan Clinical Impression: [name] is a 67 year old right-handed male with history of hypertension, diabetes mellitus, left BKA 2014 of which at that time was discharged directly home to receive home health therapies. Per chart review and patient, lives with girlfriend. One level home with ramped entrance. Patient with assistance as needed. Patient initially with prosthesis after left BKA but later decreased wearing time due to ulceration of left stump. Presented 12/22/2017 with progressive ischemic changes of stump findings of osteomyelitis as well as findings of osteomyelitis right fifth metatarsal. Underwent left AKA, right fifth ray amputation 12/24/2017 per Dr. [name]. Hospital course pain management requiring IV Dilaudid. Bilateral wound vacs applied. Subcutaneous Lovenox for DVT prophylaxis. Acute blood loss anemia 10.0 and monitored. Therapy evaluations completed with recommendations of physical medicine rehabilitation consult. Patient was admitted for a comprehensive  rehabilitation program  Patient transferred to CIR on 12/26/2017 .   Patient currently requires min with mobility secondary to muscle weakness and muscle joint tightness, decreased cardiorespiratoy endurance and decreased sitting  balance and difficulty maintaining precautions.  Prior to hospitalization, patient was modified independent  with mobility and lived with Significant other in a House home.  Home access is  Ramped entrance.  Patient will benefit from skilled PT intervention to maximize safe functional mobility, minimize fall risk and decrease caregiver burden for planned discharge home with 24 hour supervision.  Anticipate patient will benefit from follow up [place] at discharge.  PT - End of Session Activity Tolerance: Tolerates 10 - 20 min activity with multiple rests Endurance Deficit: Yes Endurance Deficit Description: fatigues quickly with mobility activities PT Assessment Rehab Potential (ACUTE/IP ONLY): Good PT Barriers to Discharge: Weight bearing restrictions PT Barriers to Discharge Comments: NWB BLE PT Patient demonstrates impairments in the following area(s): Balance;Sensory;Endurance;Motor;Pain;Skin Integrity PT Transfers Functional Problem(s): Bed Mobility;Bed to Chair;Car PT Locomotion Functional Problem(s): Wheelchair Mobility PT Plan PT Intensity: Minimum of 1-2 x/day ,45 to 90 minutes PT Frequency: 5 out of 7 days PT Duration Estimated Length of Stay: 7-10 days PT Treatment/Interventions: Community reintegration;DME/adaptive equipment instruction;UE/LE Strength taining/ROM;Wheelchair propulsion/positioning;Psychosocial support;UE/LE Coordination activities;Therapeutic Activities;Skin care/wound management;Pain management;Discharge planning;Balance/vestibular training;Disease management/prevention;Functional mobility training;Patient/family education;Splinting/orthotics;Therapeutic Exercise PT Transfers Anticipated Outcome(s): modI PT Locomotion Anticipated Outcome(s): modI w/c propulsion PT Recommendation Recommendations for Other Services: Therapeutic Recreation consult Therapeutic Recreation Interventions: Kitchen group;Outing/community  reintergration Follow Up Recommendations: Home health  PT;24 hour supervision/assistance Patient destination: Home Equipment Recommended: Wheelchair (measurements) Equipment Details: has w/c however broken down, armrest welded closed will impede ability to perform slideboard transfers safely  Skilled Therapeutic Intervention Pt received seated in w/c, c/o pain as below and agreeable to treatment. PT initial evaluation as below with minA for slideboard transfers, and initiated education for LE strengthening exercises. Educated pt in rehab process, goals, estimated LOS, and falls prevention safety; pt agreeable to all the above. Pt reports he has manual w/c; states he had to weld L armrest back on after it broke and would not be able to move it out of the way for slideboard transfers to L. Therapist will follow up regarding coverage for equipment with CSW.   PT Evaluation Precautions/Restrictions Precautions Precautions: Fall Precaution Comments: wound vacs BLE Restrictions Weight Bearing Restrictions: Yes RLE Weight Bearing: Non weight bearing LLE Weight Bearing: Non weight bearing General Chart Reviewed: Yes Response to Previous Treatment: Patient reporting fatigue but able to participate. Family/Caregiver Present: No Vital Signs  Pain Pain Assessment Pain Scale: 0-10 Pain Score: 0-No pain Home Living/Prior Functioning Home Living Available Help at Discharge: Family;Available 24 hours/day Type of Home: House Home Access: Ramped entrance Home Layout: One level  Lives With: Significant other Prior Function Level of Independence: Independent with homemaking with wheelchair;Independent with transfers  Able to Take Stairs?: No Driving: No Vocation: Retired Comments: WC level d/t ongoing issues with wounds leading up to admission Vision/Perception  Perception Perception: Within Functional Limits Praxis Praxis: Intact  Cognition Overall Cognitive Status: Within Functional Limits for tasks assessed Orientation Level: Oriented  X4 Memory: Appears intact Awareness: Appears intact Problem Solving: Appears intact Safety/Judgment: Appears intact Comments: hard of hearing Sensation Sensation Light Touch: Impaired by gross assessment(decreased sensation LLE) Proprioception: Appears Intact Coordination Gross Motor Movements are Fluid and Coordinated: No Heel Shin Test: NT d/t amputation Motor  Motor Motor - Skilled Clinical Observations: generalized weakness  Mobility Bed Mobility Bed Mobility: Sit to Supine;Rolling Right;Rolling Left;Supine to Sit Rolling Right: Supervision/verbal cueing Rolling Left: Supervision/Verbal cueing Supine to Sit: Supervision/Verbal cueing Sit to Supine: Supervision/Verbal cueing Transfers Transfers: Lateral/Scoot Transfers Lateral/Scoot Transfers: Minimal Assistance - Patient > 75%(transfer board) Locomotion  Gait Ambulation: No Gait Gait: No Stairs / Additional Locomotion Stairs: No Wheelchair Mobility Wheelchair Mobility: Yes Wheelchair Assistance: Chartered loss adjuster: Both upper extremities Wheelchair Parts Management: Supervision/cueing Distance: 150'  Trunk/Postural Assessment  Cervical Assessment Cervical Assessment: Within Functional Limits Thoracic Assessment Thoracic Assessment: Within Functional Limits Lumbar Assessment Lumbar Assessment: Within Functional Limits Postural Control Postural Control: Within Functional Limits  Balance Balance Balance Assessed: Yes Static Sitting Balance Static Sitting - Balance Support: No upper extremity supported;Feet unsupported(feet unsupported d/t WB precautions) Static Sitting - Level of Assistance: 6: Modified independent (Device/Increase time) Dynamic Sitting Balance Dynamic Sitting - Balance Support: No upper extremity supported;Feet unsupported Dynamic Sitting - Level of Assistance: 5: Stand by assistance Extremity Assessment  RUE Assessment RUE Assessment: Within Functional  Limits LUE Assessment LUE Assessment: Within Functional Limits RLE Assessment RLE Assessment: Exceptions to [facility] Passive Range of Motion (PROM) Comments: [facility] General Strength Comments: 4/5 hip flexion/extension, 4+/5 knee flexion/extension and ankle dorsiflexion/plantarflexion LLE Assessment LLE Assessment: Exceptions to [facility] Passive Range of Motion (PROM) Comments: limited hip extension to neutral General Strength Comments: MMT NT d/t pain at residual limb AKA    Refer to Care Plan for Long Term Goals  Recommendations for other services: Therapeutic Recreation  Outing/community reintegration  Discharge  Criteria: Patient will be discharged from PT if patient refuses treatment 3 consecutive times without medical reason, if treatment goals not met, if there is a change in medical status, if patient makes no progress towards goals or if patient is discharged from hospital.  The above assessment, treatment plan, treatment alternatives and goals were discussed and mutually agreed upon: by patient  Corliss Skains 12/27/2017, 11:24 AM

## 2017-12-27 NOTE — Progress Notes (Signed)
[name] is a 67 y.o. male 11-26-1950 [id number]  Subjective: No new complaints, except for constipation. No new problems. Slept well. Feeling OK.  Objective: Vital signs in last 24 hours: Temp:  [98.1 F (36.7 C)-98.6 F (37 C)] 98.5 F (36.9 C) (12/21 0440) Pulse Rate:  [75-85] 75 (12/21 0440) Resp:  [16-20] 16 (12/21 0440) BP: (121-150)/(65-95) 130/65 (12/21 0440) SpO2:  [98 %] 98 % (12/21 0440) Weight:  [124 kg] 124 kg (12/20 1448) Weight change:  Last BM Date: 12/23/17  Intake/Output from previous day: 12/20 0701 - 12/21 0700 In: 480 [P.O.:480] Out: 1000 [Urine:1000] Last cbgs: CBG (last 3)  Recent Labs    12/25/17 2102 12/26/17 0630 12/26/17 1134  GLUCAP 195* 143* 217*     Physical Exam General: No apparent distress   HEENT: not dry Lungs: Normal effort. Lungs clear to auscultation, no crackles or wheezes. Cardiovascular: Regular rate and rhythm, no edema Abdomen: S/NT/ND; BS(+) Musculoskeletal:  unchanged Neurological: No new neurological deficits Wounds: Clean    Skin: clear  Aging changes Mental state: Alert, oriented, cooperative    Lab Results: BMET    Component Value Date/Time   NA 139 12/27/2017 0515   K 4.2 12/27/2017 0515   CL 104 12/27/2017 0515   CO2 25 12/27/2017 0515   GLUCOSE 147 (H) 12/27/2017 0515   BUN 19 12/27/2017 0515   CREATININE 1.00 12/27/2017 0515   CREATININE 0.98 10/05/2012 1727   CALCIUM 8.4 (L) 12/27/2017 0515   GFRNONAA >60 12/27/2017 0515   GFRAA >60 12/27/2017 0515   CBC    Component Value Date/Time   WBC 10.4 12/27/2017 0515   RBC 3.59 (L) 12/27/2017 0515   HGB 9.9 (L) 12/27/2017 0515   HCT 30.5 (L) 12/27/2017 0515   PLT 252 12/27/2017 0515   MCV 85.0 12/27/2017 0515   MCH 27.6 12/27/2017 0515   MCHC 32.5 12/27/2017 0515   RDW 16.5 (H) 12/27/2017 0515   LYMPHSABS 1.2 12/27/2017 0515   MONOABS 0.6 12/27/2017 0515   EOSABS 0.2 12/27/2017 0515   BASOSABS 0.1 12/27/2017 0515     Studies/Results: No results found.  Medications: I have reviewed the patient's current medications.  Assessment/Plan:   1.  Status post left AKA due to osteomyelitis.  Status post fifth ray amputation on 12/24/2017. CIR 2.  DVT prophylaxis with Lovenox 3.  Pain management with Norco and Robaxin. 4.  Emotional support 5.  Acute blood loss anemia.  Follow up CBC 6.  Wound care-topical.  Decubiti ulcers prevention 7.  Hypertension.  On Coreg 25 mg twice a day 8.  Diabetes with peripheral neuropathy.  On Lantus insulin 9.  Morbid obesity.  Encouraged weight loss 10.  Constipation.  See orders.    Length of stay, days: 1  Walker Kehr , MD 12/27/2017, 11:27 AM

## 2017-12-28 ENCOUNTER — Inpatient Hospital Stay (HOSPITAL_COMMUNITY): Payer: Medicare Other

## 2017-12-28 ENCOUNTER — Inpatient Hospital Stay (HOSPITAL_COMMUNITY): Payer: Medicare Other | Admitting: Occupational Therapy

## 2017-12-28 ENCOUNTER — Inpatient Hospital Stay (HOSPITAL_COMMUNITY): Payer: Medicare Other | Admitting: Physical Therapy

## 2017-12-28 DIAGNOSIS — I10 Essential (primary) hypertension: Secondary | ICD-10-CM

## 2017-12-28 DIAGNOSIS — E1159 Type 2 diabetes mellitus with other circulatory complications: Secondary | ICD-10-CM

## 2017-12-28 NOTE — Progress Notes (Signed)
Occupational Therapy Session Note  Patient Details  Name: ERVIN HENSLEY MRN: [id number] Date of Birth: Sep 30, 1950  Today's Date: 12/28/2017 OT Individual Time: 1100-1155 OT Individual Time Calculation (min): 55 min    Short Term Goals: Week 1:  OT Short Term Goal 1 (Week 1): Pt will complete lateral scoot to BSC with (S) OT Short Term Goal 2 (Week 1): Pt will complete peri hygiene via lateral leans with (S) on BSC OT Short Term Goal 3 (Week 1): Pt will don LB clothing EOB with (S)   Skilled Therapeutic Interventions/Progress Updates:    Pt resting in w/c upon arrival and agreeable to therapy.  OT intervention with focus on discharge planning, safety awareness, w/c setup, and sliding board transfers w/c<>drop arm BSC.  Pt required CGA for transfers.  Dycem provided to prevent sliding board slipping during transfer.  Pt practiced transfers X 2. Pt remained in w/c with all needs within reach.    Therapy Documentation Precautions:  Precautions Precautions: Fall Precaution Comments: wound vacs BLE Restrictions Weight Bearing Restrictions: Yes RLE Weight Bearing: Non weight bearing LLE Weight Bearing: Non weight bearing  Pain:  Pt denies pain but c/o "soreness in LLE; reposiitoned   Therapy/Group: Individual Therapy  Leroy Libman 12/28/2017, 12:03 PM

## 2017-12-28 NOTE — Plan of Care (Signed)
  Problem: Consults Goal: RH LIMB LOSS PATIENT EDUCATION Description Description: See Patient Education module for eduction specifics. Outcome: Progressing   Problem: RH BOWEL ELIMINATION Goal: RH STG MANAGE BOWEL WITH ASSISTANCE Description STG Manage Bowel with  Mod I Assistance.  Outcome: Progressing Goal: RH STG MANAGE BOWEL W/MEDICATION W/ASSISTANCE Description STG Manage Bowel with Medication with mod I Assistance.  Outcome: Progressing   Problem: RH SKIN INTEGRITY Goal: RH STG SKIN FREE OF INFECTION/BREAKDOWN Description Manage skin with min assist  Outcome: Progressing Goal: RH STG ABLE TO PERFORM INCISION/WOUND CARE W/ASSISTANCE Description STG Able To Perform Incision/Wound Care With min Assistance.  Outcome: Progressing   Problem: RH SAFETY Goal: RH STG ADHERE TO SAFETY PRECAUTIONS W/ASSISTANCE/DEVICE Description STG Adhere to Safety Precautions With cues/reminders Assistance/Device.  Outcome: Progressing   Problem: RH PAIN MANAGEMENT Goal: RH STG PAIN MANAGED AT OR BELOW PT'S PAIN GOAL Description At or below level 4  Outcome: Progressing   Problem: RH KNOWLEDGE DEFICIT LIMB LOSS Goal: RH STG INCREASE KNOWLEDGE OF SELF CARE AFTER LIMB LOSS Description Pt will be able to perform or direct others to perform care at discharge using handouts/resources/references, etc independently  Outcome: Progressing

## 2017-12-28 NOTE — Progress Notes (Signed)
Pt placed self on CPAP for the night. Will monitor 

## 2017-12-28 NOTE — Progress Notes (Signed)
[name] is a 67 y.o. male 1950/11/13 [id number]  Subjective: No new complaints. No new problems. Slept well. Feeling OK.  He had a bowel movement  Objective: Vital signs in last 24 hours: Temp:  [98.1 F (36.7 C)-98.5 F (36.9 C)] 98.3 F (36.8 C) (12/22 0407) Pulse Rate:  [77-84] 83 (12/22 0407) Resp:  [16-18] 18 (12/22 0407) BP: (107-138)/(52-62) 107/52 (12/22 0407) SpO2:  [95 %-100 %] 100 % (12/22 0407) Weight change:  Last BM Date: 12/27/17  Intake/Output from previous day: 12/21 0701 - 12/22 0700 In: 462 [P.O.:462] Out: 900 [Urine:900] Last cbgs: CBG (last 3)  Recent Labs    12/25/17 2102 12/26/17 0630 12/26/17 1134  GLUCAP 195* 143* 217*     Physical Exam General: No apparent distress, obese HEENT: not dry Lungs: Normal effort. Lungs clear to auscultation, no crackles or wheezes. Cardiovascular: Regular rate and rhythm, no edema Abdomen: S/NT/ND; BS(+) Musculoskeletal:  unchanged Neurological: No new neurological deficits Wounds: Clean Skin: clear  Aging changes Mental state: Alert, oriented, cooperative    Lab Results: BMET    Component Value Date/Time   NA 139 12/27/2017 0515   K 4.2 12/27/2017 0515   CL 104 12/27/2017 0515   CO2 25 12/27/2017 0515   GLUCOSE 147 (H) 12/27/2017 0515   BUN 19 12/27/2017 0515   CREATININE 1.00 12/27/2017 0515   CREATININE 0.98 10/05/2012 1727   CALCIUM 8.4 (L) 12/27/2017 0515   GFRNONAA >60 12/27/2017 0515   GFRAA >60 12/27/2017 0515   CBC    Component Value Date/Time   WBC 10.4 12/27/2017 0515   RBC 3.59 (L) 12/27/2017 0515   HGB 9.9 (L) 12/27/2017 0515   HCT 30.5 (L) 12/27/2017 0515   PLT 252 12/27/2017 0515   MCV 85.0 12/27/2017 0515   MCH 27.6 12/27/2017 0515   MCHC 32.5 12/27/2017 0515   RDW 16.5 (H) 12/27/2017 0515   LYMPHSABS 1.2 12/27/2017 0515   MONOABS 0.6 12/27/2017 0515   EOSABS 0.2 12/27/2017 0515   BASOSABS 0.1 12/27/2017 0515    Studies/Results: No results  found.  Medications: I have reviewed the patient's current medications.  Assessment/Plan:   1.  Status post left AKA due to osteomyelitis.  Status post ray amputation on 12/24/2017.  Continue with CIR 2.  DVT prophylaxis with Lovenox 3.  Pain management with Norco and Robaxin 4.  Emotional support 5.  Acute blood loss anemia.  I will check CBC next week 6.  Wound care.  Pressure ulcer prevention 7.  Hypertension.  Continue with Coreg 25 mg twice a day 8.  Type 2 diabetes with peripheral neuropathy.  He is on Lantus 9.  Morbid obesity.  Weight loss was encouraged 10.  Constipation.  Better on current therapy.    Length of stay, days: 2  Walker Kehr , MD 12/28/2017, 12:37 PM

## 2017-12-28 NOTE — Progress Notes (Signed)
Physical Therapy Session Note  Patient Details  Name: [name] MRN: [id number] Date of Birth: 03/30/1950  Today's Date: 12/28/2017 PT Individual Time: 0805-0903 PT Individual Time Calculation (min): 58 min   Short Term Goals: Week 1:  PT Short Term Goal 1 (Week 1): =LTG due to estimated LOS  Skilled Therapeutic Interventions/Progress Updates: Pt presented in bed agreeable to therapy. Performed supine to sit with bed features supervision and PTA able to obtain SB. Performed SB transfer to L "downhill" minA. Pt propelled to rehab gym and performed SB transfer to mat minA. Pt participated in seated therex as follows: LAQ, hip flexion, hip abd/add 2 x 15 with RLE. BUE chest press, forward rows and bicep curls 3lb dowel.      Therapy Documentation Precautions:  Precautions Precautions: Fall Precaution Comments: wound vacs BLE Restrictions Weight Bearing Restrictions: Yes RLE Weight Bearing: Non weight bearing LLE Weight Bearing: Non weight bearing    Therapy/Group: Individual Therapy  Millianna Szymborski  Jasia Hiltunen, PTA  12/28/2017, 12:37 PM

## 2017-12-28 NOTE — Progress Notes (Signed)
Occupational Therapy Session Note  Patient Details  Name: [name] MRN: [id number] Date of Birth: [birth year]  Today's Date: 12/28/2017 OT Individual Time: 1300-1400 OT Individual Time Calculation (min): 60 min    Short Term Goals: Week 1:  OT Short Term Goal 1 (Week 1): Pt will complete lateral scoot to BSC with (S) OT Short Term Goal 2 (Week 1): Pt will complete peri hygiene via lateral leans with (S) on BSC OT Short Term Goal 3 (Week 1): Pt will don LB clothing EOB with (S)   Skilled Therapeutic Interventions/Progress Updates:    Pt seen for OT session focusing on functional transfers and core strengthening/stability. Pt sitting up in w/c upon arrival, complaints of "generalized soreness", RN made aware and pain medication administered at start of session. He self propelled w/c throughout unit with assist to manage wound vacs. In ADL apartment, completed sliding board transfer to standard bed up slight incline and requiring min A and multi-modal cuing for head/hip relationship and hand placement during transfer. Sit<>supine with supervision. Min A sliding board transfer to return to w/c. Recommended and demonstrated use of bed rail to place at bed side to assist with UE support during bed mobility and transfers. Printed out option from [employer], pt plans to have family order for d/c use. In therapy gym, completed sliding board transfer w/c <> EOM with supervision and assist to steady equipment. Pt able to manage all w/c parts independently.  Completed core strengthening exercises seated EOM, x4 sets of 5 Modified crunches, reclining back on foam wedge and pillows and coming into upright sitting. Last two trials completed using #5 weighted ball for UE strengthening component. Pt returned to w/c and self propelled w/c back to room. Pt's girlfriend present and assisted pt with supervision level transfer back to bed. Cont to complete hands on training with pt's girlfriend in prep for d/c  home. Pt left seated EOB at end of session with girlfriend present, RN aware of pt's position.   Therapy Documentation Precautions:  Precautions Precautions: Fall Precaution Comments: wound vacs BLE Restrictions Weight Bearing Restrictions: Yes RLE Weight Bearing: Non weight bearing LLE Weight Bearing: Non weight bearing   Therapy/Group: Individual Therapy  Jaimey Franchini L 12/28/2017, 6:50 AM

## 2017-12-29 ENCOUNTER — Inpatient Hospital Stay (HOSPITAL_COMMUNITY): Payer: Medicare Other | Admitting: Physical Therapy

## 2017-12-29 ENCOUNTER — Inpatient Hospital Stay (HOSPITAL_COMMUNITY): Payer: Medicare Other

## 2017-12-29 ENCOUNTER — Inpatient Hospital Stay (HOSPITAL_COMMUNITY): Payer: Medicare Other | Admitting: Occupational Therapy

## 2017-12-29 DIAGNOSIS — E46 Unspecified protein-calorie malnutrition: Secondary | ICD-10-CM

## 2017-12-29 DIAGNOSIS — E1142 Type 2 diabetes mellitus with diabetic polyneuropathy: Secondary | ICD-10-CM

## 2017-12-29 DIAGNOSIS — R7309 Other abnormal glucose: Secondary | ICD-10-CM

## 2017-12-29 DIAGNOSIS — D62 Acute posthemorrhagic anemia: Secondary | ICD-10-CM

## 2017-12-29 DIAGNOSIS — R0989 Other specified symptoms and signs involving the circulatory and respiratory systems: Secondary | ICD-10-CM

## 2017-12-29 LAB — GLUCOSE, CAPILLARY
GLUCOSE-CAPILLARY: 163 mg/dL — AB (ref 70–99)
Glucose-Capillary: 151 mg/dL — ABNORMAL HIGH (ref 70–99)
Glucose-Capillary: 187 mg/dL — ABNORMAL HIGH (ref 70–99)
Glucose-Capillary: 136 mg/dL — ABNORMAL HIGH (ref 70–99)
Glucose-Capillary: 153 mg/dL — ABNORMAL HIGH (ref 70–99)
Glucose-Capillary: 134 mg/dL — ABNORMAL HIGH (ref 70–99)
Glucose-Capillary: 162 mg/dL — ABNORMAL HIGH (ref 70–99)
Glucose-Capillary: 164 mg/dL — ABNORMAL HIGH (ref 70–99)
Glucose-Capillary: 196 mg/dL — ABNORMAL HIGH (ref 70–99)
Glucose-Capillary: 176 mg/dL — ABNORMAL HIGH (ref 70–99)
Glucose-Capillary: 225 mg/dL — ABNORMAL HIGH (ref 70–99)
Glucose-Capillary: 172 mg/dL — ABNORMAL HIGH (ref 70–99)

## 2017-12-29 MED ORDER — PRO-STAT SUGAR FREE PO LIQD
30.0000 mL | Freq: Two times a day (BID) | ORAL | Status: DC
  Administered 2017-12-29 – 2018-01-02 (×10): 30 mL via ORAL
  Filled 2017-12-29 (×11): qty 30

## 2017-12-29 NOTE — Progress Notes (Addendum)
Physical Therapy Session Note  Patient Details  Name: [name] MRN: [id number] Date of Birth: 05-09-1950  Today's Date: 12/29/2017 PT Individual Time: 1015-1115 PT Individual Time Calculation (min): 60 min   Short Term Goals: Week 1:  PT Short Term Goal 1 (Week 1): =LTG due to estimated LOS  Skilled Therapeutic Interventions/Progress Updates:   Pt sitting up in w/c.  He stated that overhead bil UE exs yesterday made bil shoulders sore overnight and this morning.  Pt had no problems with use of bil UEs for slide board transfer earlier today.  wc propulsion using bil UEs over level tile, 70' x 2 during session, with supervision and cues for safety when entering doorways, and efficiency.   Discussed home set-up.  Pt does not have measurements for bed height or Office manager ht.  He will be doing an actual car transfer into the SUV before d/c.  Slide board transfer w/c to mat table to R, as per home set-up, for slightly uphill tranfer, contact guard and cues for head hips relationship.  When returning to w/c, no cues needed, supervision.  Supine exercises: R hip external rotator self-stretch x 30 seconds x 3; 15 x 1 : R straight leg raises, bil glut sets: in L side lying: 15 x 1 R hip abduction with flexed hips and R knee; in R side lying: 15 x 1 L hip abduction with flexed hip;  2 x 15 L hip extension.  Cueing for counting aloud to prevent Valsalva maneuver, and to slow down.  Educated pt on preventing L hip flexion contracture via positioning and exercises.  Pt left resting in w/c with needs at hand.     Therapy Documentation Precautions:  Precautions Precautions: Fall Precaution Comments: wound vacs BLE Restrictions Weight Bearing Restrictions: Yes RLE Weight Bearing: Non weight bearing LLE Weight Bearing: Non weight bearing  Pain: pt denies; premedicated      Therapy/Group: Individual Therapy  Elo Marmolejos 12/29/2017, 12:21 PM

## 2017-12-29 NOTE — Progress Notes (Signed)
[facility] PHYSICAL MEDICINE & REHABILITATION PROGRESS NOTE  Subjective/Complaints: Patient seen working with therapies this morning.  He states he slept well overnight.  He states he had a good weekend and is exhausted from therapies.  ROS: Denies CP, shortness of breath, nausea, vomiting, diarrhea.  Objective: Vital Signs: Blood pressure 140/65, pulse 86, temperature 98.8 F (37.1 C), temperature source Oral, resp. rate 18, height 6\' 2"  (1.88 m), weight 124 kg, SpO2 97 %. No results found. Recent Labs    12/27/17 0515  WBC 10.4  HGB 9.9*  HCT 30.5*  PLT 252   Recent Labs    12/27/17 0515  NA 139  K 4.2  CL 104  CO2 25  GLUCOSE 147*  BUN 19  CREATININE 1.00  CALCIUM 8.4*    Physical Exam: BP 140/65 (BP Location: Right Arm)   Pulse 86   Temp 98.8 F (37.1 C) (Oral)   Resp 18   Ht 6\' 2"  (1.88 m)   Wt 124 kg   SpO2 97%   BMI 35.10 kg/m  Constitutional: Well-developed. Obese  HENT: Normocephalic and atraumatic.  Eyes: EOMI.  No discharge. Cardiovascular: Normal rate, regular rhythm.  No JVD. Respiratory: Effort normal and breath sounds normal.  GI: Bowel sounds are normal. He exhibits no distension.  Musculoskeletal: Left AKA Right fifth ray amputation  Neurological: He is alert and oriented Motor: Bilateral upper extremities: 4+/5 proximal distal Left lower extremity: 4/5 hip flexion (pain inhibition) Right lower extremity: 4/5 proximal distal (some pain in patient) Skin: Bilateral wound VACS in place  Psychiatric: He has a normal mood and affect. His behavior is normal. Thought content normal.   Assessment/Plan: 1. Functional deficits secondary to left AKA with right fifth ray amputation which require 3+ hours per day of interdisciplinary therapy in a comprehensive inpatient rehab setting.  Physiatrist is providing close team supervision and 24 hour management of active medical problems listed below.  Physiatrist and rehab team continue to assess  barriers to discharge/monitor patient progress toward functional and medical goals  Care Tool:  Bathing    Body parts bathed by patient: Right arm, Left arm, Chest, Abdomen, Front perineal area, Buttocks, Right upper leg, Left upper leg     Body parts n/a: Left lower leg, Right lower leg(L AKA, R lower leg wrapped)   Bathing assist Assist Level: Minimal Assistance - Patient > 75%     Upper Body Dressing/Undressing Upper body dressing   What is the patient wearing?: Pull over shirt    Upper body assist Assist Level: Set up assist    Lower Body Dressing/Undressing Lower body dressing      What is the patient wearing?: Pants     Lower body assist Assist for lower body dressing: Minimal Assistance - Patient > 75%     Toileting Toileting    Toileting assist Assist for toileting: Minimal Assistance - Patient > 75%     Transfers Chair/bed transfer  Transfers assist  Chair/bed transfer activity did not occur: Safety/medical concerns  Chair/bed transfer assist level: Contact Guard/Touching assist     Locomotion Ambulation   Ambulation assist   Ambulation activity did not occur: Safety/medical concerns(NWB BLE)          Walk 10 feet activity   Assist  Walk 10 feet activity did not occur: Safety/medical concerns        Walk 50 feet activity   Assist Walk 50 feet with 2 turns activity did not occur: Safety/medical concerns  Walk 150 feet activity   Assist Walk 150 feet activity did not occur: Safety/medical concerns         Walk 10 feet on uneven surface  activity   Assist Walk 10 feet on uneven surfaces activity did not occur: Safety/medical concerns         Wheelchair     Assist Will patient use wheelchair at discharge?: Yes Type of Wheelchair: Manual    Wheelchair assist level: Supervision/Verbal cueing Max wheelchair distance: 15    Wheelchair 50 feet with 2 turns activity    Assist        Assist  Level: Supervision/Verbal cueing   Wheelchair 150 feet activity     Assist     Assist Level: Supervision/Verbal cueing      Medical Problem List and Plan: 1.   Decreased functional mobility secondary to left AKA secondary to osteomyelitis as well as right fifth ray amputation 12/24/2017.Nonweightbearing bilateral lower extremities  Continue CIR  Weekend notes reviewed 2.  DVT Prophylaxis/Anticoagulation: Subcutaneous Lovenox. Monitor for any bleeding episodes 3. Pain Management:  Hydrocodone as needed, Robaxin as needed.   4. Mood:  Provide emotional support 5. Neuropsych: This patient is capable of making decisions on his own behalf. 6. Skin/Wound Care:  Routine skin checks 7. Fluids/Electrolytes/Nutrition:  Routine in and out's  BMP within acceptable range on 12/21 8. Acute blood loss anemia.   Hemoglobin 9.9 on 12/21  Continue to monitor 9. Hypertension. Coreg 25 mg twice a day  Labile on 12/23 10. Diabetes mellitus with peripheral neuropathy. Latest hemoglobin A1c 5.7. Lantus insulin 42 units daily. Check blood sugars before meals and at bedtime. Diabetic teaching  Labile on 12/23 11.  Morbid obesity: Encouraged weight loss 12.  Hypoalbuminemia  Supplement initiated on 12/23  LOS: 3 days A FACE TO FACE EVALUATION WAS PERFORMED  Saleemah Mollenhauer Lorie Phenix 12/29/2017, 9:18 AM

## 2017-12-29 NOTE — Progress Notes (Signed)
Occupational Therapy Session Note  Patient Details  Name: [name] MRN: [id number] Date of Birth: [birth year]  Today's Date: 12/29/2017 OT Individual Time: 1300-1400 OT Individual Time Calculation (min): 60 min    Short Term Goals: Week 1:  OT Short Term Goal 1 (Week 1): Pt will complete lateral scoot to BSC with (S) OT Short Term Goal 2 (Week 1): Pt will complete peri hygiene via lateral leans with (S) on BSC OT Short Term Goal 3 (Week 1): Pt will don LB clothing EOB with (S)   Skilled Therapeutic Interventions/Progress Updates:    Pt seen for OT ADL bathing/dressing session. Pt sitting up in w/c upon arrival, denying pain and agreeable to tx session, requesting to complete bathing/dressing routine.  Pt completed sliding board transfer w/c>EOB, assist to stabilize equipment and supervision. He completed bathing/dressing routine seated on EOB with set-up/supervision. LAteral leans to complete pericare/buttock hygiene and return to supine to roll to pull pants up. UB bathing/dressing completed with set-up.  He sat EOB to complete UE strengthening exercises using level II (orange) theraband exercises. COmpleted x1 set of 10 chest expansion, diagonal up, diagonal down, upright row and bicep curl.  Demonstration and multi-modal cuing provided for proper form and technique. Pt left seated EOB at end of session, bed alarm on and all needs in reach.   Therapy Documentation Precautions:  Precautions Precautions: Fall Precaution Comments: wound vacs BLE Restrictions Weight Bearing Restrictions: Yes RLE Weight Bearing: Non weight bearing LLE Weight Bearing: Non weight bearing Pain:   No/denies pain   Therapy/Group: Individual Therapy  Cherith Tewell L 12/29/2017, 6:37 AM

## 2017-12-29 NOTE — Progress Notes (Signed)
Inpatient Rehabilitation  Patient information reviewed and entered into eRehab system by Mindy Gali M. Dhana Totton, M.A., CCC/SLP, PPS Coordinator.  Information including medical coding, functional ability and quality indicators will be reviewed and updated through discharge.    Per nursing patient was given "Data Collection Information Summary" for Patients in Inpatient Rehabilitation Facilities with attached "Privacy Act Statement-Health Care Records" upon admission.   

## 2017-12-29 NOTE — Care Management (Signed)
[facility] Individual Statement of Services  Patient Name:  [name]  Date:  12/29/2017  Welcome to the [facility].  Our goal is to provide you with an individualized program based on your diagnosis and situation, designed to meet your specific needs.  With this comprehensive rehabilitation program, you will be expected to participate in at least 3 hours of rehabilitation therapies Monday-Friday, with modified therapy programming on the weekends.  Your rehabilitation program will include the following services:  Physical Therapy (PT), Occupational Therapy (OT), 24 hour per day rehabilitation nursing, Case Management (Social Worker), Rehabilitation Medicine, Nutrition Services and Pharmacy Services  Weekly team conferences will be held on Wednesday to discuss your progress.  Your Social Worker will talk with you frequently to get your input and to update you on team discussions.  Team conferences with you and your family in attendance may also be held.  Expected length of stay: 7-10 days  Overall anticipated outcome: independent with device  Depending on your progress and recovery, your program may change. Your Social Worker will coordinate services and will keep you informed of any changes. Your Social Worker's name and contact numbers are listed  below.  The following services may also be recommended but are not provided by the [facility]:    [place] will be made to provide these services after discharge if needed.  Arrangements include referral to agencies that provide these services.  Your insurance has been verified to be:  [employer] Your primary doctor is:  Viviana Simpler  Pertinent information will be shared with your doctor and your insurance company.  Social Worker:  Ovidio Kin, [place] or ([phone]  Information discussed with and copy given to patient by: Elease Hashimoto, 12/29/2017, 10:47 AM

## 2017-12-29 NOTE — Progress Notes (Signed)
Placed patient on CPAP for the night with pressure set at 7cm  

## 2017-12-29 NOTE — Progress Notes (Signed)
Social Work  Social Work Assessment and Plan  Patient Details  Name: [name] MRN: [id number] Date of Birth: April 15, 1950  Today's Date: 12/29/2017  Problem List:  Patient Active Problem List   Diagnosis Date Noted  . Hypoalbuminemia due to protein-calorie malnutrition ([place])   . Type 2 diabetes mellitus with peripheral neuropathy ([facility])   . Labile blood pressure   . Labile blood glucose   . Left above-knee amputee ([place]) 12/26/2017  . Unilateral AKA, left ([place])   . History of complete ray amputation of fifth toe of right foot ([place])   . Postoperative pain   . Acute blood loss anemia   . Essential hypertension   . Poorly controlled type 2 diabetes mellitus with peripheral neuropathy ([place])   . Subacute osteomyelitis, right ankle and foot ([place])   . Cellulitis of extremity 12/20/2017  . Cellulitis and abscess of left lower extremity 12/20/2017  . PAD (peripheral artery disease) ([place]) 02/19/2017  . Chronic renal disease, stage III ([place]) 02/19/2017  . Morbid obesity ([place]) 11/05/2016  . Ulcer of toe of right foot, limited to breakdown of skin ([place]) 10/11/2016  . Non-pressure chronic ulcer of left calf, limited to breakdown of skin ([place]) 06/04/2016  . Idiopathic chronic venous hypertension of right lower extremity with ulcer and inflammation ([place]) 06/04/2016  . Left below-knee amputee ([place]) 02/23/2016  . Type 2 diabetes mellitus with diabetic polyneuropathy, with long-term current use of insulin ([place]) 03/29/2015  . Advance directive discussed with patient 02/07/2014  . Normocytic anemia 04/01/2013  . Diabetic Charcot foot ([place]) 04/01/2013  . Routine general medical examination at a health care facility 12/25/2010  . HEMORRHOIDS-INTERNAL 11/22/2009  . PERSONAL HX COLONIC POLYPS 11/22/2009  . Venous (peripheral) insufficiency 06/29/2008  . Osteoarthritis, multiple sites 04/03/2007  . ERECTILE DYSFUNCTION, ORGANIC 06/13/2006  . Hyperlipemia 06/11/2006  . GLAUCOMA 06/11/2006  .  Coronary atherosclerosis of native coronary artery 06/11/2006  . Obstructive sleep apnea 06/11/2006   Past Medical History:  Past Medical History:  Diagnosis Date  . Coronary atherosclerosis of unspecified type of vessel, native or graft   . Diabetes mellitus without complication ([place])   . Hemorrhage of rectum and anus   . History of kidney stones   . Impotence of organic origin   . Internal hemorrhoids without mention of complication   . Malignant neoplasm of prostate ([place])   . Mononeuritis of unspecified site   . Osteoarthrosis, unspecified whether generalized or localized, unspecified site   . Other and unspecified hyperlipidemia   . Personal history of colonic polyps   . Personal history of diabetic foot ulcer    saw wound center, resolved 05/08/2010  . Personal history of gallstones   . Routine general medical examination at a health care facility   . Type II or unspecified type diabetes mellitus with neurological manifestations, not stated as uncontrolled(250.60)   . Unspecified glaucoma(365.9)   . Unspecified sleep apnea    cpcp  . Unspecified venous (peripheral) insufficiency    Past Surgical History:  Past Surgical History:  Procedure Laterality Date  . AMPUTATION Left 12/30/2012   Procedure: AMPUTATION RAY ;  Surgeon: Meredith Pel, MD;  Location: [facility];  Service: Orthopedics;  Laterality: Left;  LEFT GREAT TOE RAY AMPUTATION  . AMPUTATION Left 02/23/2016   Procedure: Left Below Knee Amputation;  Surgeon: [name], MD;  Location: [facility];  Service: Orthopedics;  Laterality: Left;  . AMPUTATION Left 12/24/2017   Procedure: LEFT ABOVE KNEE AMPUTATION;  Surgeon:  [name], MD;  Location: [facility];  Service: Orthopedics;  Laterality: Left;  . AMPUTATION Right 12/24/2017   Procedure: RIGHT 5TH RAY AMPUTATION;  Surgeon: [name], MD;  Location: [facility];  Service: Orthopedics;  Laterality: Right;  . APPENDECTOMY    . BASAL CELL CARCINOMA EXCISION  2/16   left  forearm  . CARDIAC CATHETERIZATION  1998   Negative  . CATARACT EXTRACTION Right 2017   then lid surgery  . CORONARY ARTERY BYPASS GRAFT  09/2005   Post op AFIB  . EYE SURGERY    . FOOT BONE EXCISION Left 11/03/2013   DR DUDA   . I&D EXTREMITY Left 11/03/2013   Procedure: Left Foot Partial Bone Excision Cuboid and Medial Cuneiform, Wound Closures;  Surgeon: [name], MD;  Location: [facility];  Service: Orthopedics;  Laterality: Left;  . INSERTION PROSTATE RADIATION SEED  2009   RT and seeds for prostate cancer  . KIDNEY STONE SURGERY  04/1993  . RCA stents  04/2003   EF 55%  . RETINAL DETACHMENT SURGERY  2002-2003  . thrombosed vein  1993   Right leg   Social History:  reports that he quit smoking about 16 years ago. His smoking use included cigars and cigarettes. He has a 70.00 pack-year smoking history. He has never used smokeless tobacco. He reports previous alcohol use. He reports that he does not use drugs.  Family / Support Systems Marital Status: Widow/Widower Patient Roles: Partner, Parent Spouse/Significant OtherBarbarann Ehlers [phone] Children: David-son [phone] Other Supports: Friends and church members Anticipated Caregiver: Libby Ability/Limitations of Caregiver: In good health-no limitations Caregiver Availability: 24/7 Family Dynamics: Close knit with son and [name] have always been there for one another and assist when needed. Pt wants to be as independent as possible before going home he does not like asking for help.  Social History Preferred language: English Religion: Christian Cultural Background: No issues Education: Engineer, manufacturing Read: Yes Write: Yes Employment Status: Retired Public relations account executive Issues: No issues Guardian/Conservator: None-according to MD pt is capable of making his own decisions while here   Abuse/Neglect Abuse/Neglect Assessment Can Be Completed: Yes Physical Abuse: Denies Verbal Abuse:  Denies Sexual Abuse: Denies Exploitation of patient/patient's resources: Denies Self-Neglect: Denies  Emotional Status Pt's affect, behavior and adjustment status: Pt is motivated and ready to do rehab. He is wanting to be as independent as possible before going home with [facility]. She has helped him in the past and will again. He has all of his equipment form past surgeries. Recent Psychosocial Issues: other health issues Psychiatric History: No history deferred depression screeing due to doing well and coping appropriatley at this time. Will monitor and see if needs to see neuro-psych while here Substance Abuse History: No issues  Patient / Family Perceptions, Expectations & Goals Pt/Family understanding of illness & functional limitations: Pt and Libbly have a good understanding of his amputations and WB issues. He hopes the wound vacs come off in the next few days. He does talk with his MD daily and feels he has a good understanding of his treatment plan going forward. Premorbid pt/family roles/activities: fiance, father, retiree, church member, friend Anticipated changes in roles/activities/participation: resume Pt/family expectations/goals: Pt states: " I plan on being able to do as much as I can for myself before I leave here."  [name] states: " I will do for him but he is stubborn and wants to do on his own."  [employer]:  Other (Comment)(had in past) Premorbid Home Care/DME Agencies: Other (Comment)(has equipment from previous surgeries) Transportation available at discharge: libby and family Resource referrals recommended: Support group (specify)  Discharge Planning Living Arrangements: Spouse/significant other Support Systems: Spouse/significant other, Children, Friends/neighbors, Church/faith community Type of Residence: Private residence Insurance Resources: [employer], Multimedia programmer (specify)([employer]) Financial Resources: Charity fundraiser Screen Referred: No Living Expenses: Own Money Management: Patient Does the patient have any problems obtaining your medications?: No Home Management: [name] does the home management Patient/Family Preliminary Plans: Return home with [name] who will be assisting him at home. His son is also suportive and involved. He has been through this before and feels he knows what is expected. Will await therapy team on LOS and levels. Work on discharge plans. Social Work Anticipated Follow Up Needs: HH/OP, Support Group  Clinical Impression Pleasant gentleman who is motivated to do well here and regain as much as he can before going home. [name] is involved and will assist at discharge. Will look into equipment needs and follow up therapies.  Elease Hashimoto 12/29/2017, 10:45 AM

## 2017-12-29 NOTE — Progress Notes (Signed)
Physical Therapy Session Note  Patient Details  Name: [name] MRN: [id number] Date of Birth: Oct 23, 1950  Today's Date: 12/29/2017 PT Individual Time: 0800-0910 PT Individual Time Calculation (min): 70 min   Short Term Goals: Week 1:  PT Short Term Goal 1 (Week 1): =LTG due to estimated LOS  Skilled Therapeutic Interventions/Progress Updates: Pt received seated on EOB, c/o pain in R shoulder from overhead exercises yesterday; premedicated and agreeable to treatment. Transfer bed>w/c with transfer board and close S. Hygiene at sink with modI and increased time. W/c propulsion throughout unit with S. Performed couch transfer with min guard downhill to couch, modA uphill back to w/c. Transfer w/c <>mat table with transfer board and S, min cues for technique and setup. Seated long arc quad 2x15 reps RLE, 2x15 hip flexion marching; 1# weight on R ankle for increased resistance. Sit <>supine with S. Supine straight leg raise 2x15 BLE, sidelying hip abduction 2x15 BLE. Per CSW pt unable to qualify for new w/c as he received his current chair less than five years ago. Discussed with pt transitioning between his w/c, mom's w/c, and power chair at home depending on need to transfer to L until WB precautions upgraded and able to squat/stand pivot over welded arm rest. Semi-reclined trunk rotations with 2kg weighted ball for core strengthening, 2x20. Returned to w/c with S slideboard transfer, S w/c propulsion to return to room. Remained seated in w/c at end of session, all needs in reach.      Therapy Documentation Precautions:  Precautions Precautions: Fall Precaution Comments: wound vacs BLE Restrictions Weight Bearing Restrictions: Yes RLE Weight Bearing: Non weight bearing LLE Weight Bearing: Non weight bearing    Therapy/Group: Individual Therapy  Corliss Skains 12/29/2017, 9:14 AM

## 2017-12-30 ENCOUNTER — Inpatient Hospital Stay (HOSPITAL_COMMUNITY): Payer: Medicare Other | Admitting: Occupational Therapy

## 2017-12-30 ENCOUNTER — Inpatient Hospital Stay (HOSPITAL_COMMUNITY): Payer: Medicare Other | Admitting: Physical Therapy

## 2017-12-30 DIAGNOSIS — S78112A Complete traumatic amputation at level between left hip and knee, initial encounter: Secondary | ICD-10-CM

## 2017-12-30 NOTE — Plan of Care (Signed)
  Problem: Consults Goal: RH LIMB LOSS PATIENT EDUCATION Description Description: See Patient Education module for eduction specifics. Outcome: Progressing   Problem: RH BOWEL ELIMINATION Goal: RH STG MANAGE BOWEL WITH ASSISTANCE Description STG Manage Bowel with  Mod I Assistance.  Outcome: Progressing Goal: RH STG MANAGE BOWEL W/MEDICATION W/ASSISTANCE Description STG Manage Bowel with Medication with mod I Assistance.  Outcome: Progressing   Problem: RH SKIN INTEGRITY Goal: RH STG SKIN FREE OF INFECTION/BREAKDOWN Description Manage skin with min assist  Outcome: Progressing Goal: RH STG ABLE TO PERFORM INCISION/WOUND CARE W/ASSISTANCE Description STG Able To Perform Incision/Wound Care With min Assistance.  Outcome: Progressing   Problem: RH SAFETY Goal: RH STG ADHERE TO SAFETY PRECAUTIONS W/ASSISTANCE/DEVICE Description STG Adhere to Safety Precautions With cues/reminders Assistance/Device.  Outcome: Progressing   Problem: RH PAIN MANAGEMENT Goal: RH STG PAIN MANAGED AT OR BELOW PT'S PAIN GOAL Description At or below level 4  Outcome: Progressing   Problem: RH KNOWLEDGE DEFICIT LIMB LOSS Goal: RH STG INCREASE KNOWLEDGE OF SELF CARE AFTER LIMB LOSS Description Pt will be able to perform or direct others to perform care at discharge using handouts/resources/references, etc independently  Outcome: Progressing

## 2017-12-30 NOTE — Progress Notes (Signed)
Social Work Patient ID: [name], male   DOB: [birth year], 67 y.o.   MRN: [id number] Met with pt and girlfriend to inform team conference goals supervision level and discharge date 12/28. Girlfriend to do actual car transfer on Friday and education. Work on discharge needs.

## 2017-12-30 NOTE — Progress Notes (Signed)
Pt placed himself on CPAP for the night.

## 2017-12-30 NOTE — Patient Care Conference (Signed)
Inpatient RehabilitationTeam Conference and Plan of Care Update Date: 12/30/2017   Time: 9:30 AM    Patient Name: [name]      Medical Record Number: [id number]  Date of Birth: August 01, 1950 Sex: Male         Room/Bed: 4M02C/4M02C-01 Payor Info: Payor: MEDICARE / Plan: MEDICARE PART A AND B / Product Type: *No Product type* /    Admitting Diagnosis: aka  Admit Date/Time:  12/26/2017  2:42 PM Admission Comments: No comment available   Primary Diagnosis:  <principal problem not specified> Principal Problem: <principal problem not specified>  Patient Active Problem List   Diagnosis Date Noted  . Hypoalbuminemia due to protein-calorie malnutrition ([place])   . Type 2 diabetes mellitus with peripheral neuropathy ([facility])   . Labile blood pressure   . Labile blood glucose   . Left above-knee amputee ([place]) 12/26/2017  . Unilateral AKA, left ([place])   . History of complete ray amputation of fifth toe of right foot ([place])   . Postoperative pain   . Acute blood loss anemia   . Essential hypertension   . Poorly controlled type 2 diabetes mellitus with peripheral neuropathy ([place])   . Subacute osteomyelitis, right ankle and foot ([place])   . Cellulitis of extremity 12/20/2017  . Cellulitis and abscess of left lower extremity 12/20/2017  . PAD (peripheral artery disease) ([place]) 02/19/2017  . Chronic renal disease, stage III ([place]) 02/19/2017  . Morbid obesity ([place]) 11/05/2016  . Ulcer of toe of right foot, limited to breakdown of skin ([place]) 10/11/2016  . Non-pressure chronic ulcer of left calf, limited to breakdown of skin ([place]) 06/04/2016  . Idiopathic chronic venous hypertension of right lower extremity with ulcer and inflammation ([place]) 06/04/2016  . Left below-knee amputee ([place]) 02/23/2016  . Type 2 diabetes mellitus with diabetic polyneuropathy, with long-term current use of insulin ([place]) 03/29/2015  . Advance directive discussed with patient 02/07/2014  . Normocytic anemia 04/01/2013  .  Diabetic Charcot foot ([place]) 04/01/2013  . Routine general medical examination at a health care facility 12/25/2010  . HEMORRHOIDS-INTERNAL 11/22/2009  . PERSONAL HX COLONIC POLYPS 11/22/2009  . Venous (peripheral) insufficiency 06/29/2008  . Osteoarthritis, multiple sites 04/03/2007  . ERECTILE DYSFUNCTION, ORGANIC 06/13/2006  . Hyperlipemia 06/11/2006  . GLAUCOMA 06/11/2006  . Coronary atherosclerosis of native coronary artery 06/11/2006  . Obstructive sleep apnea 06/11/2006    Expected Discharge Date: Expected Discharge Date: 01/03/18  Team Members Present: Physician leading conference: Dr. Delice Lesch Social Worker Present: Ovidio Kin, LCSW Nurse Present: Dorien Chihuahua, RN PT Present: Kennyth Lose, PT OT Present: Amy Rounds, OT SLP Present: Windell Moulding, SLP PPS Coordinator present : Daiva Nakayama, RN, CRRN     Current Status/Progress Goal Weekly Team Focus  Medical   Decreased functional mobility secondary to left AKA secondary to osteomyelitis as well as right fifth ray amputation 12/24/2017.Nonweightbearing bilateral lower extremities  Improve mobility, transfers, BP/DM, +VACs  See above   Bowel/Bladder   Pt is continent of bowel and bladder, LBM-12/29/17  Continue to be continent of bowel and bladder  Assist with toileting as needed   Swallow/Nutrition/ Hydration             ADL's   Min A sliding board transfers, supervision-min A bathing/dressing seated EOB, CGA toileting  Supervision overall  Functional transfers, ADL re-training, family education, d/c planning   Mobility   S to minA w/c level  modI w/c level, S car transfer  activity tolerance, strengthening, ROM   Communication  Safety/Cognition/ Behavioral Observations            Pain   Pt frequently complains of bilateral shoulder pain, receives prn hydrocodone with full effects noted  maintain pain level below 3/10.   Assess and address pain every shift and prn   Skin   Incision noted to  Left leg, pt is AKA, incision noted to right foot-R 5th toe amputation-wound vacs intact  No s/sx of infection, no further areas of skin impairment  Assess skin every shift and as needed      *See Care Plan and progress notes for long and short-term goals.     Barriers to Discharge  Current Status/Progress Possible Resolutions Date Resolved   Physician    Medical stability;Wound Care;Weight bearing restrictions     See above  Therapies, optimize DM/HTN meds, D/c VACs tomorrow      Nursing                  PT  Weight bearing restrictions  NWB BLE              OT                  SLP                SW                Discharge Planning/Teaching Needs:  HOme with Libby-fiance who can provide supervision level. Pt wants to do as much as he can for himself before going home      Team Discussion:  Goals supervision wheelchair-currently min assist level. Wound vac's DC tomorrow. BP and BS up and down MD monitoring both. Fiancee here Friday for education.   Revisions to Treatment Plan:  DC 12/28    Continued Need for Acute Rehabilitation Level of Care: The patient requires daily medical management by a physician with specialized training in physical medicine and rehabilitation for the following conditions: Daily direction of a multidisciplinary physical rehabilitation program to ensure safe treatment while eliciting the highest outcome that is of practical value to the patient.: Yes Daily medical management of patient stability for increased activity during participation in an intensive rehabilitation regime.: Yes Daily analysis of laboratory values and/or radiology reports with any subsequent need for medication adjustment of medical intervention for : Post surgical problems;Wound care problems;Diabetes problems;Blood pressure problems   I attest that I was present, lead the team conference, and concur with the assessment and plan of the team.   Elease Hashimoto 12/30/2017, 10:08 AM

## 2017-12-30 NOTE — Discharge Instructions (Signed)
Inpatient Rehab Discharge Instructions  [name] Discharge date and time: No discharge date for patient encounter.   Activities/Precautions/ Functional Status: Activity: nonweightbearing right lower extremity Diet: diabetic diet Wound Care: keep wound clean and dry Functional status:  ___ No restrictions     ___ Walk up steps independently ___ 24/7 supervision/assistance   ___ Walk up steps with assistance ___ Intermittent supervision/assistance  ___ Bathe/dress independently ___ Walk with walker     _x__ Bathe/dress with assistance ___ Walk Independently    ___ Shower independently ___ Walk with assistance    ___ Shower with assistance ___ No alcohol     ___ Return to work/school ________  Special Instructions:    COMMUNITY REFERRALS UPON DISCHARGE:    Home Health:   PT & RN    [place]    Date of last service:01/03/2018  Medical Equipment/Items Ordered:[address] [place]   [phone]   GENERAL COMMUNITY RESOURCES FOR PATIENT/FAMILY: Support Groups:[facility] THE SECOND Thursday @ 7:00-8:30 PM IN THE [facility] ROOM 1H105 QUESTIONS CONTACT ROBIN [phone]  My questions have been answered and I understand these instructions. I will adhere to these goals and the provided educational materials after my discharge from the hospital.  Patient/Caregiver Signature _______________________________ Date __________  Clinician Signature _______________________________________ Date __________  Please bring this form and your medication list with you to all your follow-up doctor's appointments.

## 2017-12-30 NOTE — Progress Notes (Signed)
[facility] PHYSICAL MEDICINE & REHABILITATION PROGRESS NOTE  Subjective/Complaints: Patient seen working with therapy this morning.  He states he slept well overnight.  He has questions about when his wound vacs will come off again.  ROS: Denies CP, shortness of breath, nausea, vomiting, diarrhea.  Objective: Vital Signs: Blood pressure 137/66, pulse 79, temperature 98.7 F (37.1 C), temperature source Oral, resp. rate 17, height 6\' 2"  (1.88 m), weight 124 kg, SpO2 98 %. No results found. No results for input(s): WBC, HGB, HCT, PLT in the last 72 hours. No results for input(s): NA, K, CL, CO2, GLUCOSE, BUN, CREATININE, CALCIUM in the last 72 hours.  Physical Exam: BP 137/66 (BP Location: Right Arm)   Pulse 79   Temp 98.7 F (37.1 C) (Oral)   Resp 17   Ht 6\' 2"  (1.88 m)   Wt 124 kg   SpO2 98%   BMI 35.10 kg/m  Constitutional: Well-developed. Obese  HENT: Normocephalic and atraumatic.  Eyes: EOMI.  No discharge. Cardiovascular: RRR.  No JVD. Respiratory: Effort normal and breath sounds normal.  GI: Bowel sounds are normal. He exhibits no distension.  Musculoskeletal: Left AKA Right fifth ray amputation  Neurological: He is alert and oriented Motor: Bilateral upper extremities: 4+/5 proximal distal Left lower extremity: 4/5 hip flexion (pain inhibition) Right lower extremity: 4/5 proximal distal (some pain inhibition) Skin: Bilateral wound VACs in place, suctioning Psychiatric: He has a normal mood and affect. His behavior is normal. Thought content normal.   Assessment/Plan: 1. Functional deficits secondary to left AKA with right fifth ray amputation which require 3+ hours per day of interdisciplinary therapy in a comprehensive inpatient rehab setting.  Physiatrist is providing close team supervision and 24 hour management of active medical problems listed below.  Physiatrist and rehab team continue to assess barriers to discharge/monitor patient progress toward  functional and medical goals  Care Tool:  Bathing    Body parts bathed by patient: Right arm, Left arm, Chest, Abdomen, Front perineal area, Buttocks, Right upper leg, Left upper leg     Body parts n/a: Left lower leg, Right lower leg   Bathing assist Assist Level: Supervision/Verbal cueing     Upper Body Dressing/Undressing Upper body dressing   What is the patient wearing?: Pull over shirt    Upper body assist Assist Level: Set up assist    Lower Body Dressing/Undressing Lower body dressing      What is the patient wearing?: Pants     Lower body assist Assist for lower body dressing: Supervision/Verbal cueing     Toileting Toileting    Toileting assist Assist for toileting: Minimal Assistance - Patient > 75%     Transfers Chair/bed transfer  Transfers assist  Chair/bed transfer activity did not occur: Safety/medical concerns  Chair/bed transfer assist level: Contact Guard/Touching assist     Locomotion Ambulation   Ambulation assist   Ambulation activity did not occur: Safety/medical concerns(NWB BLE)          Walk 10 feet activity   Assist  Walk 10 feet activity did not occur: Safety/medical concerns        Walk 50 feet activity   Assist Walk 50 feet with 2 turns activity did not occur: Safety/medical concerns         Walk 150 feet activity   Assist Walk 150 feet activity did not occur: Safety/medical concerns         Walk 10 feet on uneven surface  activity   Assist Walk 10  feet on uneven surfaces activity did not occur: Safety/medical concerns         Wheelchair     Assist Will patient use wheelchair at discharge?: Yes Type of Wheelchair: Manual    Wheelchair assist level: Supervision/Verbal cueing Max wheelchair distance: 70    Wheelchair 50 feet with 2 turns activity    Assist        Assist Level: Supervision/Verbal cueing   Wheelchair 150 feet activity     Assist     Assist Level:  Supervision/Verbal cueing      Medical Problem List and Plan: 1.   Decreased functional mobility secondary to left AKA secondary to osteomyelitis as well as right fifth ray amputation 12/24/2017.Nonweightbearing bilateral lower extremities  Continue CIR 2.  DVT Prophylaxis/Anticoagulation: Subcutaneous Lovenox. Monitor for any bleeding episodes 3. Pain Management:  Hydrocodone as needed, Robaxin as needed.   4. Mood:  Provide emotional support 5. Neuropsych: This patient is capable of making decisions on his own behalf. 6. Skin/Wound Care:  Routine skin checks 7. Fluids/Electrolytes/Nutrition:  Routine in and out's  BMP within acceptable range on 12/21 8. Acute blood loss anemia.   Hemoglobin 9.9 on 12/21  Continue to monitor 9. Hypertension. Coreg 25 mg twice a day  Labile on 12/24 10. Diabetes mellitus with peripheral neuropathy. Latest hemoglobin A1c 5.7. Lantus insulin 42 units daily. Check blood sugars before meals and at bedtime. Diabetic teaching  Labile on 12/23, no CBGs recorded since yesterday afternoon 11.  Morbid obesity: Encouraged weight loss 12.  Hypoalbuminemia  Supplement initiated on 12/23  LOS: 4 days A FACE TO FACE EVALUATION WAS PERFORMED   Lorie Phenix 12/30/2017, 7:59 AM

## 2017-12-30 NOTE — Progress Notes (Signed)
Occupational Therapy Session Note  Patient Details  Name: [name] MRN: [id number] Date of Birth: [birth year]  Today's Date: 12/30/2017 OT Individual Time: 0930-1005 OT Individual Time Calculation (min): 35 min    Short Term Goals: Week 1:  OT Short Term Goal 1 (Week 1): Pt will complete lateral scoot to BSC with (S) OT Short Term Goal 2 (Week 1): Pt will complete peri hygiene via lateral leans with (S) on BSC OT Short Term Goal 3 (Week 1): Pt will don LB clothing EOB with (S)   Skilled Therapeutic Interventions/Progress Updates:    Pt received in w/c stating that he just got finished using the [facility] with nursing. He stated he used the slide board from w/c to [facility] and was able to do so with minimal to no physical A.  He stated he used lateral leans for clothing management and clean up.  Discussed exercises and then practiced exercises he can do for his core strength to make those movements even easier.  He worked on lateral wt shifts using his obliques with arms folding across chest and forward trunk flexion ("chair sit ups") using his abdominals only. Discussed his shoulder pain and PMH of shoulder pain.  Recommended after 1-2 days when pain decreases from recent use that he stretch arms daily but not try to lift weight over head.  He practiced gentle AROM with small arm circles with thumb up at shoulder height in abduction.  Reviewed exercises to try later when his pain is less focused on scapular retraction and elbow extension.   Place ice pack on pt's shoulder with pillow cover.  Recommended he keep it on for at least 15-20 min, longer if his skin is not irritated.   Pt in room with his fiancee and all needs met.  Therapy Documentation Precautions:  Precautions Precautions: Fall Precaution Comments: wound vacs BLE Restrictions Weight Bearing Restrictions: Yes RLE Weight Bearing: Non weight bearing LLE Weight Bearing: Non weight bearing       Pain: Pain Assessment Pain Scale:  0-10 Pain Score: 5  Pain Type: Acute pain Pain Location: Shoulder Pain Orientation: Right;Anterior Pain Descriptors / Indicators: Aching Pain Onset: With Activity Pain Intervention(s): Cold applied ADL: ADL Eating: Independent Where Assessed-Eating: Edge of bed Grooming: Setup Where Assessed-Grooming: Edge of bed Upper Body Bathing: Supervision/safety Where Assessed-Upper Body Bathing: Edge of bed Lower Body Bathing: Minimal cueing, Minimal assistance Where Assessed-Lower Body Bathing: Edge of bed Upper Body Dressing: Setup Where Assessed-Upper Body Dressing: Edge of bed Lower Body Dressing: Minimal cueing, Minimal assistance Where Assessed-Lower Body Dressing: Bed level Toileting: Minimal assistance Where Assessed-Toileting: Bedside Commode Toilet Transfer: Minimal assistance, Moderate verbal cueing Toilet Transfer Method: Theatre manager: Extra wide bedside commode Tub/Shower Transfer: Unable to assess(not cleared to shower)  Therapy/Group: Individual Therapy  [name] 12/30/2017, 11:27 AM

## 2017-12-30 NOTE — Progress Notes (Signed)
Physical Therapy Session Note  Patient Details  Name: [name] MRN: [id number] Date of Birth: [birth year]  Today's Date: 12/30/2017 PT Individual Time: 0700-0830 PT Individual Time Calculation (min): 90 min   Short Term Goals: Week 1:  PT Short Term Goal 1 (Week 1): =LTG due to estimated LOS  Skilled Therapeutic Interventions/Progress Updates: Pt received supine in bed, denies pain at rest and agreeable to treatment. Bed mobility with S; transfer bed>w/c with minA d/t slight uphill transfer, increased time required. Performs hygiene at sink with setupA. Provided pt with home measurement sheet and emphasized heights of transfer surfaces, as pt has already been accessing home via w/c prior to admission it is unnecessary for fiance to measure doorway widths. Performed car transfer with transfer board uphill to 29" height; pt unsure of height but estimated based on similar vehicle heights on chart. Required minA for uphill transfer, min guard for downhill transfer out of car. Transfer w/c <>mat table with S at level height. Performed LE strengthening exercises in sitting with 1# weight on RLE including long arc quad RLE, hip flexion marching BLE, supine straight leg raise, sidelying hip abduction all 2x15 reps. Attempted prone lying to determine tolerance; able to transition with S and increased time, and once prone able to tolerate position, however pain in shoulders during transition and unable to prop on elbows. Discussed alternative methods for stretching hip flexors including time spent fully supine, LE off edge of mat/bed. Returned to w/c with S transfer board. W/c propulsion to return to room with S for strengthening and endurance. Remained seated in w/c at end of session, all needs in reach.      Therapy Documentation Precautions:  Precautions Precautions: Fall Precaution Comments: wound vacs BLE Restrictions Weight Bearing Restrictions: Yes RLE Weight Bearing: Non weight bearing LLE  Weight Bearing: Non weight bearing    Therapy/Group: Individual Therapy  Corliss Skains 12/30/2017, 8:47 AM

## 2017-12-30 NOTE — Progress Notes (Signed)
Occupational Therapy Session Note  Patient Details  Name: [name] MRN: [id number] Date of Birth: [birth year]  Today's Date: 12/30/2017 OT Individual Time: 1250-1350 OT Individual Time Calculation (min): 60 min    Short Term Goals: Week 1:  OT Short Term Goal 1 (Week 1): Pt will complete lateral scoot to BSC with (S) OT Short Term Goal 2 (Week 1): Pt will complete peri hygiene via lateral leans with (S) on BSC OT Short Term Goal 3 (Week 1): Pt will don LB clothing EOB with (S)   Skilled Therapeutic Interventions/Progress Updates:    Pt seen for OT ADL bathing/dressing session. Pt in supine upon arrival agreeable to tx session. He voiced "generalized soreness" from previous sessions denying need for intervention. Pt requesting to complete bathing/dressing routine. Pt transferred to sitting EOB and completed bathing/dressing with set-up/supervision. He returned to supine mod I to complete LB clothing managemnt, VCs for adherence to [place] as pt attempting to bridge to pull pants up. He returned to sitting EOB and completed supervision slide board transfer back to w/c with set-up assist.  Pt scheduled to have wound vacs off tomorrow. Therefore will be cleared to shower. Practiced simulated shower stall transfer w/c<> drop arm BSC in shower. Transfer completed with min A using grab bars and assist for management and stabilization of equipment. Educated regarding lateral leans for clothing management and importance of barrier btwn skin and slide board.  He self propelled w/c throughout unit with supervision for UE strengthening/endurance, ~5 minutes constant propulsion. Pt returned to room at end of session, left seated in w/c with all needs in reach.   Therapy Documentation Precautions:  Precautions Precautions: Fall Precaution Comments: wound vacs BLE Restrictions Weight Bearing Restrictions: Yes RLE Weight Bearing: Non weight bearing LLE Weight Bearing: Non weight  bearing   Therapy/Group: Individual Therapy  Elgie Landino L 12/30/2017, 6:17 AM

## 2017-12-31 NOTE — Progress Notes (Addendum)
PHYSICAL MEDICINE & REHABILITATION PROGRESS NOTE  Subjective/Complaints: Patient seen laying in bed.  Family at bedside.  He states that he had some diarrhea earlier, but that has resolved and believes it was something he ate.  ROS: Denies CP, shortness of breath, nausea, vomiting, diarrhea.  Objective: Vital Signs: Blood pressure (!) 142/73, pulse 87, temperature 98.6 F (37 C), temperature source Oral, resp. rate 20, height 6\' 2"  (1.88 m), weight 124 kg, SpO2 98 %. No results found. No results for input(s): WBC, HGB, HCT, PLT in the last 72 hours. No results for input(s): NA, K, CL, CO2, GLUCOSE, BUN, CREATININE, CALCIUM in the last 72 hours.  Physical Exam: BP (!) 142/73 (BP Location: Right Arm)   Pulse 87   Temp 98.6 F (37 C) (Oral)   Resp 20   Ht 6\' 2"  (1.88 m)   Wt 124 kg   SpO2 98%   BMI 35.10 kg/m  Constitutional: Well-developed. Obese  HENT: Normocephalic and atraumatic.  Eyes: EOMI.  No discharge. Cardiovascular: RRR. No JVD. Respiratory: Effort normal and breath sounds normal.  GI: Bowel sounds are normal. He exhibits no distension.  Musculoskeletal: Left AKA Right fifth ray amputation  Neurological: He is alert and oriented Motor: Bilateral upper extremities: 4+/5 proximal distal, stable Left lower extremity: 4/5 hip flexion (pain inhibition), stable Right lower extremity: 4/5 proximal distal (some pain inhibition) Skin: Bilateral lower extremities with dressings C/D/I Psychiatric: He has a normal mood and affect. His behavior is normal. Thought content normal.   Assessment/Plan: 1. Functional deficits secondary to left AKA with right fifth ray amputation which require 3+ hours per day of interdisciplinary therapy in a comprehensive inpatient rehab setting.  Physiatrist is providing close team supervision and 24 hour management of active medical problems listed below.  Physiatrist and rehab team continue to assess barriers to discharge/monitor  patient progress toward functional and medical goals  Care Tool:  Bathing    Body parts bathed by patient: Right arm, Left arm, Chest, Abdomen, Front perineal area, Buttocks, Right upper leg, Left upper leg     Body parts n/a: Left lower leg, Right lower leg   Bathing assist Assist Level: Supervision/Verbal cueing     Upper Body Dressing/Undressing Upper body dressing   What is the patient wearing?: Pull over shirt    Upper body assist Assist Level: Set up assist    Lower Body Dressing/Undressing Lower body dressing      What is the patient wearing?: Pants     Lower body assist Assist for lower body dressing: Supervision/Verbal cueing     Toileting Toileting    Toileting assist Assist for toileting: Minimal Assistance - Patient > 75%     Transfers Chair/bed transfer  Transfers assist  Chair/bed transfer activity did not occur: Safety/medical concerns  Chair/bed transfer assist level: Minimal Assistance - Patient > 75%     Locomotion Ambulation   Ambulation assist   Ambulation activity did not occur: Safety/medical concerns(NWB BLE)          Walk 10 feet activity   Assist  Walk 10 feet activity did not occur: Safety/medical concerns        Walk 50 feet activity   Assist Walk 50 feet with 2 turns activity did not occur: Safety/medical concerns         Walk 150 feet activity   Assist Walk 150 feet activity did not occur: Safety/medical concerns         Walk 10 feet on uneven  surface  activity   Assist Walk 10 feet on uneven surfaces activity did not occur: Safety/medical concerns         Wheelchair     Assist Will patient use wheelchair at discharge?: Yes Type of Wheelchair: Manual    Wheelchair assist level: Supervision/Verbal cueing Max wheelchair distance: 70    Wheelchair 50 feet with 2 turns activity    Assist        Assist Level: Supervision/Verbal cueing   Wheelchair 150 feet activity      Assist     Assist Level: Supervision/Verbal cueing      Medical Problem List and Plan: 1.   Decreased functional mobility secondary to left AKA secondary to osteomyelitis as well as right fifth ray amputation 12/24/2017.Nonweightbearing bilateral lower extremities  Continue CIR 2.  DVT Prophylaxis/Anticoagulation: Subcutaneous Lovenox. Monitor for any bleeding episodes 3. Pain Management:  Hydrocodone as needed, Robaxin as needed.   4. Mood:  Provide emotional support 5. Neuropsych: This patient is capable of making decisions on his own behalf. 6. Skin/Wound Care:  Routine skin checks  D/C VACs today 7. Fluids/Electrolytes/Nutrition:  Routine in and out's  BMP within acceptable range on 12/21  Will  Order labs for the end of this week 8. Acute blood loss anemia.   Hemoglobin 9.9 on 12/21  Will  Order labs for the end of this week  Continue to monitor 9. Hypertension. Coreg 25 mg twice a day  Slightly elevated on 12/25 10. Diabetes mellitus with peripheral neuropathy. Latest hemoglobin A1c 5.7. Lantus insulin 42 units daily. Check blood sugars before meals and at bedtime. Diabetic teaching  Labile on 12/23, no CBGs recorded x48 hours. 11.  Morbid obesity: Encouraged weight loss 12.  Hypoalbuminemia  Supplement initiated on 12/23  LOS: 5 days A FACE TO FACE EVALUATION WAS PERFORMED  Shahad Mazurek Lorie Phenix 12/31/2017, 7:58 AM

## 2017-12-31 NOTE — Progress Notes (Signed)
Order to DC wound vac to L AKA and R foot. Wound vacs removed, cleansed incisional sites with NS and applied dry drsg and compression wrap. R foot had no drainage noted, L AKA had miniminal drainage noted. Pt refused any pain medication. Will cont to monitor.   Erie Noe, LPN

## 2018-01-01 ENCOUNTER — Inpatient Hospital Stay (HOSPITAL_COMMUNITY): Payer: Medicare Other | Admitting: Physical Therapy

## 2018-01-01 ENCOUNTER — Inpatient Hospital Stay (HOSPITAL_COMMUNITY): Payer: Medicare Other | Admitting: Occupational Therapy

## 2018-01-01 LAB — GLUCOSE, CAPILLARY
Glucose-Capillary: 158 mg/dL — ABNORMAL HIGH (ref 70–99)
Glucose-Capillary: 216 mg/dL — ABNORMAL HIGH (ref 70–99)
Glucose-Capillary: 176 mg/dL — ABNORMAL HIGH (ref 70–99)
Glucose-Capillary: 203 mg/dL — ABNORMAL HIGH (ref 70–99)
Glucose-Capillary: 181 mg/dL — ABNORMAL HIGH (ref 70–99)
Glucose-Capillary: 247 mg/dL — ABNORMAL HIGH (ref 70–99)
Glucose-Capillary: 217 mg/dL — ABNORMAL HIGH (ref 70–99)
Glucose-Capillary: 209 mg/dL — ABNORMAL HIGH (ref 70–99)
Glucose-Capillary: 160 mg/dL — ABNORMAL HIGH (ref 70–99)
Glucose-Capillary: 266 mg/dL — ABNORMAL HIGH (ref 70–99)
Glucose-Capillary: 176 mg/dL — ABNORMAL HIGH (ref 70–99)
Glucose-Capillary: 242 mg/dL — ABNORMAL HIGH (ref 70–99)
Glucose-Capillary: 148 mg/dL — ABNORMAL HIGH (ref 70–99)
Glucose-Capillary: 214 mg/dL — ABNORMAL HIGH (ref 70–99)

## 2018-01-01 MED ORDER — METHOCARBAMOL 500 MG PO TABS: 500.0000 mg | ORAL_TABLET | Freq: Four times a day (QID) | ORAL | 0 refills | Status: DC | PRN

## 2018-01-01 MED ORDER — LINACLOTIDE 145 MCG PO CAPS: 145.0000 ug | ORAL_CAPSULE | Freq: Every day | ORAL | 1 refills | Status: DC

## 2018-01-01 MED ORDER — TAMSULOSIN HCL 0.4 MG PO CAPS: 0.4000 mg | ORAL_CAPSULE | Freq: Every day | ORAL | 0 refills | Status: DC

## 2018-01-01 MED ORDER — INSULIN GLARGINE 100 UNITS/ML SOLOSTAR PEN: 42.0000 [IU] | PEN_INJECTOR | Freq: Every day | SUBCUTANEOUS | 11 refills | Status: DC

## 2018-01-01 MED ORDER — CARVEDILOL 25 MG PO TABS: 25.0000 mg | ORAL_TABLET | Freq: Two times a day (BID) | ORAL | 3 refills | Status: DC

## 2018-01-01 MED ORDER — ACETAMINOPHEN 325 MG PO TABS: 325.0000 mg | ORAL_TABLET | Freq: Four times a day (QID) | ORAL | Status: DC | PRN

## 2018-01-01 MED ORDER — OXYCODONE HCL 5 MG PO TABS: 5.0000 mg | ORAL_TABLET | ORAL | 0 refills | Status: DC | PRN

## 2018-01-01 NOTE — Progress Notes (Signed)
Orthopedic Tech Progress Note Patient Details:  [name] December 27, 1950 [id number]  Patient ID: [name], male   DOB: 05/26/1950, 67 y.o.   MRN: [id number] Called in order to [place] 01/01/2018, 1:49 PM

## 2018-01-01 NOTE — Progress Notes (Signed)
Physical Therapy Session Note  Patient Details  Name: [name] MRN: [id number] Date of Birth: [birth year]  Today's Date: 01/01/2018 PT Individual Time: 1530(make up time)-1550 PT Individual Time Calculation (min): 20 min   Short Term Goals: Week 1:  PT Short Term Goal 1 (Week 1): =LTG due to estimated LOS  Skilled Therapeutic Interventions/Progress Updates:   Pt in w/c and agreeable to therapy, denies pain but reports increased fatigue this afternoon. Requesting to return to bed after session. Pt self-propelled w/c around unit w/ BUEs and increased time. Practiced w/c mobility in household environment by weaving around cones both forwards and backwards. Occasional verbal cues for technique w/ backwards propulsion. Returned to room and pt practiced setting up slide board transfer w/o assist from therapist including w/c parts management and slide board placement. SB transfer w/ supervision. Ended session sitting EOB, all needs in reach.   Therapy Documentation Precautions:  Precautions Precautions: Fall Precaution Comments: wound vacs BLE Restrictions Weight Bearing Restrictions: Yes RLE Weight Bearing: Non weight bearing LLE Weight Bearing: Non weight bearing Vital Signs: Therapy Vitals Temp: 98.4 F (36.9 C) Temp Source: Oral Pulse Rate: 83 Resp: 17 BP: 127/65 Patient Position (if appropriate): Sitting Oxygen Therapy SpO2: 97 %  Therapy/Group: Individual Therapy  Zaccary Creech Clent Demark 01/01/2018, 3:53 PM

## 2018-01-01 NOTE — Plan of Care (Signed)
  Problem: Consults Goal: RH LIMB LOSS PATIENT EDUCATION Description Description: See Patient Education module for eduction specifics. Outcome: Progressing   Problem: RH BOWEL ELIMINATION Goal: RH STG MANAGE BOWEL WITH ASSISTANCE Description STG Manage Bowel with  Mod I Assistance.  Outcome: Progressing Goal: RH STG MANAGE BOWEL W/MEDICATION W/ASSISTANCE Description STG Manage Bowel with Medication with mod I Assistance.  Outcome: Progressing   Problem: RH SKIN INTEGRITY Goal: RH STG SKIN FREE OF INFECTION/BREAKDOWN Description Manage skin with min assist  Outcome: Progressing Goal: RH STG ABLE TO PERFORM INCISION/WOUND CARE W/ASSISTANCE Description STG Able To Perform Incision/Wound Care With min Assistance.  Outcome: Progressing   Problem: RH SAFETY Goal: RH STG ADHERE TO SAFETY PRECAUTIONS W/ASSISTANCE/DEVICE Description STG Adhere to Safety Precautions With cues/reminders Assistance/Device.  Outcome: Progressing   Problem: RH PAIN MANAGEMENT Goal: RH STG PAIN MANAGED AT OR BELOW PT'S PAIN GOAL Description At or below level 4  Outcome: Progressing   Problem: RH KNOWLEDGE DEFICIT LIMB LOSS Goal: RH STG INCREASE KNOWLEDGE OF SELF CARE AFTER LIMB LOSS Description Pt will be able to perform or direct others to perform care at discharge using handouts/resources/references, etc independently  Outcome: Progressing

## 2018-01-01 NOTE — Progress Notes (Addendum)
Physical Therapy Session Note  Patient Details  Name: [name] MRN: [id number] Date of Birth: 02/07/1950  Today's Date: 01/01/2018 PT Individual Time: 1000-1100 and 1300-1330 PT Individual Time Calculation (min): 60 min and 30 min (total 90 min)   Short Term Goals: Week 1:  PT Short Term Goal 1 (Week 1): =LTG due to estimated LOS  Skilled Therapeutic Interventions/Progress Updates: Tx 1: Pt received seated in w/c, fiance present for car transfer and family training. Pt propelled w/c throughout unit with BUE and S. Performed car transfer to/from pt's [employer] trials; therapist assisting on first trial, pt's fiance assisting on second trial, min guard with transfer board. Discussed options for transferring off curb to elevate height of w/c compared to seat; therapist, pt and fiance walked around hospital to determine possible location for transfer when d/c home Saturday. Discussed with RN plan to transfer into car at [facility] entrance, provided fiance with instructions for accessing [place] entrance. Remained in w/c at end of session, all needs in reach.   Tx 2: Pt received seated in w/c, denies pain and agreeable to treatment. Performed w/c <>bed transfer with S to bed set at 26" height per home measurement sheet; min verbal cues for board placement technique. Performed LE HEP including hip flexion marching, long arc quad RLE only, hip adduction isometric, glute sets each 2x15 reps.  Provided handouts with HEP as pt unable to locate original copy. Remained seated in w/c at end of session, all needs in reach.     Therapy Documentation Precautions:  Precautions Precautions: Fall Precaution Comments: wound vacs BLE Restrictions Weight Bearing Restrictions: Yes RLE Weight Bearing: Non weight bearing LLE Weight Bearing: Non weight bearing Pain: Pain Assessment Pain Scale: 0-10 Pain Score: 3  Pain Type: Acute pain Pain Location: Leg Pain Orientation: Left Pain Descriptors / Indicators:  Sore Pain Frequency: Intermittent Pain Onset: On-going Pain Intervention(s): Refused   Therapy/Group: Individual Therapy  Corliss Skains 01/01/2018, 11:16 AM

## 2018-01-01 NOTE — Discharge Summary (Signed)
Discharge summary job # [id number]

## 2018-01-01 NOTE — Progress Notes (Signed)
Occupational Therapy Session Note  Patient Details  Name: [name] MRN: [id number] Date of Birth: 10-Feb-1950  Today's Date: 01/01/2018 OT Individual Time: 2751-7001 and 1430-1500 OT Individual Time Calculation (min): 60 min and 30 min   Short Term Goals: Week 1:  OT Short Term Goal 1 (Week 1): Pt will complete lateral scoot to BSC with (S) OT Short Term Goal 2 (Week 1): Pt will complete peri hygiene via lateral leans with (S) on BSC OT Short Term Goal 3 (Week 1): Pt will don LB clothing EOB with (S)   Skilled [place] Interventions/Progress Updates:    Session One: Pt seen for OT ADL bathing/dressing session. Pt in supine upon arrival, awake and agreeable to tx session and denying pain. Bed mobility completed mod I using hospital bed functions. He completed sliding board transfer EOB>w/c with set-up and assist to stabilzie equipment. B residual limbs wrapped in prep for shower. He completed sliding board transfer w/c> drop arm BSC in shower with min-mod A using grab bars to assist. He bathed with set-up and distant supervision seated on BSC. Shorts donned in shower prior to He transferred back out of shower with min Ain manner described above. He then returned to sitting EOB in order to dress, completed set-up- mod I level, lateral leans to advance pants over hips.  Provided education and demonstration for figure 8 wrapping of residual stump. Pt would benefit from [facility] like [name] on L for foot protection and R shrinker, PA aware of request.  Pt left sitting in w/c at end of session completing grooming tasks at sink, call bell within reach.   Session Two: Pt seen for OT session focusing on functional transfers and management of B residual limbs. Pt sitting up in w/c upon arrival, complaints of genalized fatigue from previous sessions, however, willing to participate in therapy as able. He self propelled w/c throughout unit mod I. Completed sliding board transfer w/c> elevated mat,  ultimately requiring mat to be lowered and min A 2/2 fatigue. Educated regarding modified methods and set-up changes when more fatigued, also recommended lowing personal bed at home to make more accessible and adding bed rail for UE support.  In supine on mat, pt able to adjust L shrinker to proper fitting. In modified circle sit, able to don R sock over ACE bandages for barrier/protection. Returned to sitting EOM and completed sliding board transfer back to w/c with set-up/supervision. Pt returned to room and left seated in w/c with all needs in reach.   Therapy Documentation Precautions:  Precautions Precautions: Fall Precaution Comments: wound vacs BLE Restrictions Weight Bearing Restrictions: Yes RLE Weight Bearing: Non weight bearing LLE Weight Bearing: Non weight bearing Pain:   No/denies pain   Therapy/Group: Individual Therapy  [name] L 01/01/2018, 7:04 AM

## 2018-01-01 NOTE — Progress Notes (Signed)
[place] PHYSICAL MEDICINE & REHABILITATION PROGRESS NOTE  Subjective/Complaints: Patient seen laying in bed this morning.  He states he slept well overnight.  He states he is ready to resume therapies.  ROS: Denies CP, shortness of breath, nausea, vomiting, diarrhea.  Objective: Vital Signs: Blood pressure 139/83, pulse 79, temperature 98.4 F (36.9 C), temperature source Oral, resp. rate 18, height 6\' 2"  (1.88 m), weight 124 kg, SpO2 99 %. No results found. No results for input(s): WBC, HGB, HCT, PLT in the last 72 hours. No results for input(s): NA, K, CL, CO2, GLUCOSE, BUN, CREATININE, CALCIUM in the last 72 hours.  Physical Exam: BP 139/83 (BP Location: Right Arm)   Pulse 79   Temp 98.4 F (36.9 C) (Oral)   Resp 18   Ht 6\' 2"  (1.88 m)   Wt 124 kg   SpO2 99%   BMI 35.10 kg/m  Constitutional: Well-developed. Obese  HENT: Normocephalic and atraumatic.  Eyes: EOMI.  No discharge. Cardiovascular: RRR.  No JVD. Respiratory: Effort normal and breath sounds normal.  GI: Bowel sounds are normal. He exhibits no distension.  Musculoskeletal: Left AKA Right fifth ray amputation  Neurological: He is alert and oriented Motor: Bilateral upper extremities: 4+/5 proximal distal, unchanged Left lower extremity: 4/5 hip flexion (pain inhibition), unchanged Right lower extremity: 4/5 proximal distal (some pain inhibition) Skin: Left AKA with sutures with small central area of sanguinous drainage Left fifth ray amputation with central area of opening with serosanguineous drainage Psychiatric: He has a normal mood and affect. His behavior is normal. Thought content normal.   Assessment/Plan: 1. Functional deficits secondary to left AKA with right fifth ray amputation which require 3+ hours per day of interdisciplinary therapy in a comprehensive inpatient rehab setting.  Physiatrist is providing close team supervision and 24 hour management of active medical problems listed  below.  Physiatrist and rehab team continue to assess barriers to discharge/monitor patient progress toward functional and medical goals  Care Tool:  Bathing    Body parts bathed by patient: Right arm, Left arm, Chest, Abdomen, Front perineal area, Buttocks, Right upper leg, Left upper leg     Body parts n/a: Left lower leg, Right lower leg   Bathing assist Assist Level: Supervision/Verbal cueing     Upper Body Dressing/Undressing Upper body dressing   What is the patient wearing?: Pull over shirt    Upper body assist Assist Level: Set up assist    Lower Body Dressing/Undressing Lower body dressing      What is the patient wearing?: Pants     Lower body assist Assist for lower body dressing: Supervision/Verbal cueing     Toileting Toileting    Toileting assist Assist for toileting: Minimal Assistance - Patient > 75%     Transfers Chair/bed transfer  Transfers assist  Chair/bed transfer activity did not occur: Safety/medical concerns  Chair/bed transfer assist level: Minimal Assistance - Patient > 75%     Locomotion Ambulation   Ambulation assist   Ambulation activity did not occur: Safety/medical concerns(NWB BLE)          Walk 10 feet activity   Assist  Walk 10 feet activity did not occur: Safety/medical concerns        Walk 50 feet activity   Assist Walk 50 feet with 2 turns activity did not occur: Safety/medical concerns         Walk 150 feet activity   Assist Walk 150 feet activity did not occur: Safety/medical concerns  Walk 10 feet on uneven surface  activity   Assist Walk 10 feet on uneven surfaces activity did not occur: Safety/medical concerns         Wheelchair     Assist Will patient use wheelchair at discharge?: Yes Type of Wheelchair: Manual    Wheelchair assist level: Supervision/Verbal cueing Max wheelchair distance: 70    Wheelchair 50 feet with 2 turns activity    Assist         Assist Level: Supervision/Verbal cueing   Wheelchair 150 feet activity     Assist     Assist Level: Supervision/Verbal cueing      Medical Problem List and Plan: 1.   Decreased functional mobility secondary to left AKA secondary to osteomyelitis as well as right fifth ray amputation 12/24/2017.Nonweightbearing bilateral lower extremities  Continue CIR 2.  DVT Prophylaxis/Anticoagulation: Subcutaneous Lovenox. Monitor for any bleeding episodes 3. Pain Management:  Hydrocodone as needed, Robaxin as needed.   4. Mood:  Provide emotional support 5. Neuropsych: This patient is capable of making decisions on his own behalf. 6. Skin/Wound Care:  Routine skin checks  D/Ced VACs  7. Fluids/Electrolytes/Nutrition:  Routine in and out's  BMP within acceptable range on 12/21  Labs ordered for tomorrow 8. Acute blood loss anemia.   Hemoglobin 9.9 on 12/21  Labs ordered for tomorrow  Continue to monitor 9. Hypertension. Coreg 25 mg twice a day  Labile on 12/26 10. Diabetes mellitus with peripheral neuropathy. Latest hemoglobin A1c 5.7. Lantus insulin 42 units daily. Check blood sugars before meals and at bedtime. Diabetic teaching  Labile on 12/23, 1 CBGs recorded x 72 hours. 11.  Morbid obesity: Encouraged weight loss 12.  Hypoalbuminemia  Supplement initiated on 12/23  LOS: 6 days A FACE TO FACE EVALUATION WAS PERFORMED  [name] Lorie Phenix 01/01/2018, 8:51 AM

## 2018-01-01 NOTE — Progress Notes (Signed)
Pt is alert and oriented X4, C/O right shoulder pain. Med will be given as prescribed. CBG tonight is 266 but was unable to transfer to the computer.

## 2018-01-01 NOTE — Progress Notes (Addendum)
Physical Therapy Discharge Summary  Patient Details  Name: [name] MRN: [id number] Date of Birth: [birth year]   Patient has met 6 of 7 long term goals due to improved activity tolerance, improved balance, increased strength, decreased pain and ability to compensate for deficits.  Patient to discharge at a wheelchair level Modified Independent; recommend S/minA for car transfer.   Patient's care partner is independent to provide the necessary physical assistance at discharge.  Reasons goals not met: S-MinA for car transfers, min guard/assist stabilizing WC/equipment during standard bed/chair transfers.   Recommendation:  Patient will benefit from ongoing skilled PT services in home health setting to continue to advance safe functional mobility, address ongoing impairments in strength, balance, activity tolerance, and minimize fall risk.  Equipment: transfer board  Reasons for discharge: treatment goals met and discharge from hospital  Patient/family agrees with progress made and goals achieved: Yes  PT Discharge Precautions/Restrictions  NWB B LEs  Vision/Perception  Intact    Cognition   A&Ox4  Sensation   severe peripheral neuropathy B LEs  Motor     WNL UEs and LEs  Mobility Bed Mobility Bed Mobility: Sit to Supine;Rolling Right;Rolling Left;Supine to Sit Rolling Right: Independent Rolling Left: Independent Supine to Sit: Independent Sit to Supine: Independent Transfers Transfers: Lateral/Scoot Transfers Lateral/Scoot Transfers: Independent with assistive device Transfer (Assistive device): (transfer board) Locomotion  Gait Ambulation: No Gait Gait: No Stairs / Additional Locomotion Stairs: No Architect: Yes Wheelchair Assistance: Independent with Camera operator: Both upper extremities Wheelchair Parts Management: Independent Distance: 150'  Trunk/Postural Assessment  Cervical Assessment Cervical  Assessment: Within Functional Limits Thoracic Assessment Thoracic Assessment: Within Functional Limits Lumbar Assessment Lumbar Assessment: Within Functional Limits Postural Control Postural Control: Within Functional Limits  Balance Balance Balance Assessed: Yes Static Sitting Balance Static Sitting - Balance Support: No upper extremity supported;Feet unsupported Static Sitting - Level of Assistance: 6: Modified independent (Device/Increase time) Dynamic Sitting Balance Dynamic Sitting - Balance Support: No upper extremity supported;Feet unsupported Dynamic Sitting - Level of Assistance: 6: Modified independent (Device/Increase time) Extremity Assessment          Deniece Ree PT, DPT, CBIS  Supplemental Physical Therapist [place]    Pager [phone] Acute Rehab Office [phone]

## 2018-01-02 ENCOUNTER — Inpatient Hospital Stay (HOSPITAL_COMMUNITY): Payer: Medicare Other | Admitting: Occupational Therapy

## 2018-01-02 ENCOUNTER — Inpatient Hospital Stay (HOSPITAL_COMMUNITY): Payer: Medicare Other | Admitting: Physical Therapy

## 2018-01-02 ENCOUNTER — Inpatient Hospital Stay (HOSPITAL_COMMUNITY): Payer: Medicare Other

## 2018-01-02 LAB — GLUCOSE, CAPILLARY
Glucose-Capillary: 198 mg/dL — ABNORMAL HIGH (ref 70–99)
Glucose-Capillary: 162 mg/dL — ABNORMAL HIGH (ref 70–99)
Glucose-Capillary: 146 mg/dL — ABNORMAL HIGH (ref 70–99)
Glucose-Capillary: 201 mg/dL — ABNORMAL HIGH (ref 70–99)

## 2018-01-02 LAB — CBC WITH DIFFERENTIAL/PLATELET
Abs Immature Granulocytes: 0.34 10*3/uL — ABNORMAL HIGH (ref 0.00–0.07)
Basophils Absolute: 0.1 10*3/uL (ref 0.0–0.1)
Basophils Relative: 1 %
Eosinophils Absolute: 0.3 10*3/uL (ref 0.0–0.5)
Eosinophils Relative: 3 %
HCT: 31.6 % — ABNORMAL LOW (ref 39.0–52.0)
Hemoglobin: 10.3 g/dL — ABNORMAL LOW (ref 13.0–17.0)
Immature Granulocytes: 3 %
Lymphocytes Relative: 11 %
Lymphs Abs: 1.1 10*3/uL (ref 0.7–4.0)
MCH: 27.8 pg (ref 26.0–34.0)
MCHC: 32.6 g/dL (ref 30.0–36.0)
MCV: 85.4 fL (ref 80.0–100.0)
Monocytes Absolute: 0.6 10*3/uL (ref 0.1–1.0)
Monocytes Relative: 6 %
NEUTROS PCT: 76 %
Neutro Abs: 7.7 10*3/uL (ref 1.7–7.7)
Platelets: 227 10*3/uL (ref 150–400)
RBC: 3.7 MIL/uL — ABNORMAL LOW (ref 4.22–5.81)
RDW: 17.7 % — AB (ref 11.5–15.5)
WBC: 10.1 10*3/uL (ref 4.0–10.5)
nRBC: 0.2 % (ref 0.0–0.2)

## 2018-01-02 LAB — BASIC METABOLIC PANEL
ANION GAP: 10 (ref 5–15)
BUN: 24 mg/dL — ABNORMAL HIGH (ref 8–23)
CALCIUM: 8.7 mg/dL — AB (ref 8.9–10.3)
CO2: 23 mmol/L (ref 22–32)
Chloride: 105 mmol/L (ref 98–111)
Creatinine, Ser: 0.93 mg/dL (ref 0.61–1.24)
GFR calc Af Amer: >60 mL/min (ref >60)
GFR calc non Af Amer: >60 mL/min (ref >60)
Glucose, Bld: 226 mg/dL — ABNORMAL HIGH (ref 70–99)
Potassium: 4.1 mmol/L (ref 3.5–5.1)
Sodium: 138 mmol/L (ref 135–145)

## 2018-01-02 NOTE — Progress Notes (Signed)
Occupational Therapy Session Note  Patient Details  Name: [name] MRN: [id number] Date of Birth: [birth year]  Today's Date: 01/02/2018 OT Individual Time: 1415-1500 OT Individual Time Calculation (min): 45 min    Short Term Goals: Week 1:  OT Short Term Goal 1 (Week 1): Pt will complete lateral scoot to BSC with (S) OT Short Term Goal 2 (Week 1): Pt will complete peri hygiene via lateral leans with (S) on BSC OT Short Term Goal 3 (Week 1): Pt will don LB clothing EOB with (S)   Skilled Therapeutic Interventions/Progress Updates:    Session focused on kitchen/IADL tasks and B UE strengthening/endurance. Pt completed 100 ft of w/c propulsion with mod I. He entered kitchen and prepared simple meal, with no assist, education provided throughout on w/c level accessibility and safety, as well as energy conservation. Pt then completed 10 min on b UE ergometer to increase endurance needed to propel w/c at community level distances. Pt returned to room and was left sitting up in wc with all needs met.   Therapy Documentation Precautions:  Precautions Precautions: Fall Precaution Comments: wound vacs BLE Required Braces or Orthoses: Other Brace Other Brace: Shrinker L residual limb Restrictions Weight Bearing Restrictions: Yes RLE Weight Bearing: Non weight bearing LLE Weight Bearing: Non weight bearing  Vital Signs: Therapy Vitals Temp: 97.7 F (36.5 C) Temp Source: Oral Pulse Rate: 89 Resp: 16 BP: (!) 138/123 Patient Position (if appropriate): Sitting Oxygen Therapy SpO2: 98 % O2 Device: Room Air Pain: Pain Assessment Pain Scale: 0-10 Pain Score: 0-No pain Pain Type: Acute pain Pain Location: Leg Pain Orientation: Left Pain Descriptors / Indicators: Aching;Sore Pain Onset: On-going Patients Stated Pain Goal: 0 Pain Intervention(s): Repositioned;Elevated extremity;Medication (See eMAR) Multiple Pain Sites: No   Therapy/Group: Individual Therapy  Curtis Sites 01/02/2018, 3:13 PM

## 2018-01-02 NOTE — Progress Notes (Signed)
Occupational Therapy Discharge Summary  Patient Details  Name: [name] MRN: [id number] Date of Birth: 02-Jan-1951   Patient has met 4 of 6 long term goals due to improved activity tolerance, improved balance, postural control, ability to compensate for deficits and improved coordination.  Patient to discharge at overall supervision- Modified Independent level.  Patient's care partner is independent to provide the necessary physical assistance at discharge.  Pt's fiancee has completed limited hands on caregiver training. She and pt voice feeling ready for d/c and that she is able to provide the necessary assist at d/c. Pt occasionally requiring assist to stabilize equipment during sliding board transfers and occasional min A for uneven sliding board transfers.   Reasons goals not met: Pt requires supervision/set-up for [name] and shower transfer as he requires assist to stabilize equipment when completing sliding board transfers. Pt caregiver is available and able to provide this assist at d/c.   Recommendation:  Patient will benefit from ongoing skilled OT services in home health setting to continue to advance functional skills in the area of BADL and Reduce care partner burden.  Equipment: heavy duty drop arm BSC  Reasons for discharge: treatment goals met and discharge from hospital  Patient/family agrees with progress made and goals achieved: Yes  OT Discharge Precautions/Restrictions  Precautions Precautions: Fall Required Braces or Orthoses: Other Brace Other Brace: Shrinker L residual limb Restrictions Weight Bearing Restrictions: (P) Yes RLE Weight Bearing: (P) Non weight bearing LLE Weight Bearing: (P) Non weight bearing Vision Baseline Vision/History: Wears glasses;Cataracts Wears Glasses: Reading only Patient Visual Report: No change from baseline Vision Assessment?: No apparent visual deficits Perception  Perception: Within Functional Limits Praxis Praxis:  Intact Cognition Overall Cognitive Status: Within Functional Limits for tasks assessed Arousal/Alertness: Awake/alert Orientation Level: (P) Oriented X4 Selective Attention: Appears intact Memory: Appears intact Awareness: Appears intact Problem Solving: Appears intact Safety/Judgment: Appears intact Sensation Sensation Light Touch: Impaired Detail Light Touch Impaired Details: Impaired RLE Coordination Gross Motor Movements are Fluid and Coordinated: No Fine Motor Movements are Fluid and Coordinated: Yes Coordination and Movement Description: Impaired 2/2 B LE amputations Motor  Motor Motor: Within Functional Limits Trunk/Postural Assessment  Cervical Assessment Cervical Assessment: Within Functional Limits Thoracic Assessment Thoracic Assessment: Within Functional Limits Lumbar Assessment Lumbar Assessment: Within Functional Limits Postural Control Postural Control: Within Functional Limits  Balance Balance Balance Assessed: Yes Static Sitting Balance Static Sitting - Balance Support: No upper extremity supported;Feet unsupported Static Sitting - Level of Assistance: 6: Modified independent (Device/Increase time) Dynamic Sitting Balance Dynamic Sitting - Balance Support: No upper extremity supported;Feet unsupported Dynamic Sitting - Level of Assistance: 6: Modified independent (Device/Increase time) Sitting balance - Comments: Sitting on BSC to complete lateral leans Extremity/Trunk Assessment RUE Assessment RUE Assessment: Within Functional Limits LUE Assessment LUE Assessment: Within Functional Limits   Leandria Thier L 01/02/2018, 9:17 AM

## 2018-01-02 NOTE — Progress Notes (Signed)
Physical Therapy Session Note  Patient Details  Name: [name] MRN: [id number] Date of Birth: Nov 19, 1950  Today's Date: 01/02/2018 PT Individual Time: 1000-1055 PT Individual Time Calculation (min): 55 min   Short Term Goals: Week 1:  PT Short Term Goal 1 (Week 1): =LTG due to estimated LOS  Skilled Therapeutic Interventions/Progress Updates:    Patient received up in [employer], pleasant and willing to participate in PT session today. Able to manage WC parts and complete transfer to mat table with S-min guard to stabilize WC during transfer, good adherence to NWB precautions during transfers. Worked on B LE strengthening and stretching especially for R hamstring within weight bearing precautions, also discussed reasons for R knee stiffness and importance of skin checks to residual limb and R LE as a whole due to history of wound infections and neuropathy. Otherwise worked on longer [employer] propulsion for UE endurance and strength, also worked on [employer] propulsion in close community environment (hospital gift shop) for improved navigation and safety awareness in dynamic environment. He was left up in his WC with all needs met and questions/concerns addressed this morning.   Therapy Documentation Precautions:  Precautions Precautions: Fall Precaution Comments: wound vacs BLE Required Braces or Orthoses: Other Brace Other Brace: Shrinker L residual limb Restrictions Weight Bearing Restrictions: Yes RLE Weight Bearing: Non weight bearing LLE Weight Bearing: Non weight bearing General:   Vital Signs:   Pain: Pain Assessment Pain Scale: 0-10 Pain Score: 5  Pain Type: Acute pain Pain Location: Leg Pain Orientation: Left Pain Descriptors / Indicators: Aching;Sore Pain Onset: On-going Patients Stated Pain Goal: 0 Pain Intervention(s): Repositioned;Elevated extremity;Medication (See eMAR) Multiple Pain Sites: No    Therapy/Group: Individual Therapy  Deniece Ree PT, DPT,  CBIS  Supplemental Physical Therapist [facility]    Pager [phone] Acute Rehab Office [phone]   01/02/2018, 12:17 PM

## 2018-01-02 NOTE — Discharge Summary (Addendum)
NAME: MARTAVIOUS, HARTEL MEDICAL RECORD [id number] ACCOUNT [id number] DATE OF BIRTH:07-10-1950 FACILITY: [facility]: [facility] PHYSICIAN:ANKIT PATEL, MD  DISCHARGE SUMMARY  DATE OF DISCHARGE:  01/03/2018  DISCHARGE DIAGNOSES: 1.  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   2.  Subcutaneous Lovenox for deep venous thrombosis prophylaxis. 3.  Pain management. 4.  Acute blood loss anemia. 5.  Hypertension. 6.  Diabetes mellitus.   7.  Morbid obesity.  HISTORY OF PRESENT ILLNESS:  This is a 67 year old right-handed male with history of hypertension, diabetes mellitus likely occurred in 2014, discharged directly home to home health therapies.  He lives with his girlfriend.  Initially with prosthesis  after left BKA, but later decreasing wearing time due to ulceration of the left stump.  Presented 12/22/2017 with progressive ischemic changes of the stump.  Findings of osteomyelitis as well as fungal osteomyelitis, right fifth metatarsal.  Underwent  left AKA as well as right fifth ray amputation 12/24/2017 per Dr. Rosine Beat COURSE:  Pain management bilateral wound VAC applied.  Subcutaneous Lovenox for DVT prophylaxis.  Acute blood loss anemia obtained and monitored.  The patient was admitted for comprehensive rehabilitation program.  PAST MEDICAL HISTORY:  See discharge diagnoses.  SOCIAL HISTORY:  Lives with his girlfriend.  Had been using a left prosthesis up until recently.  FUNCTIONAL STATUS:  Upon admission to rehab services was +2 physical assist supine to sit, minimal assist for anterior, posterior transfers, min mod assist for ADLs.  PHYSICAL EXAMINATION: VITAL SIGNS:  Blood pressure 124/67, pulse 91, temperature 98, respirations 16. GENERAL:  Alert male in no acute distress.  Oriented x3. HEENT:  EOMs intact. NECK:  Supple, nontender, no JVD. CARDIOVASCULAR:  Rate controlled. ABDOMEN:  Soft, nontender, good bowel  sounds. LUNGS:  Clear to auscultation.   EXTREMITIES:  Bilateral wound VACs in place.  Right lower extremity gangrene.  REHABILITATION HOSPITAL COURSE:  The patient was admitted to inpatient rehabilitation services.  Therapies initiated on a 3-hour daily basis, consisting of physical therapy, occupational therapy and rehabilitation nursing.  The following issues were  addressed during patient's rehabilitation stay:    Pertaining to the patient's left AKA secondary to right fifth ray amputation 12/24/2017, nonweightbearing bilateral lower extremities.  He would follow up with orthopedic services, Dr. [name].  Neurovascular sensation intact.  Wound VAC had been  discontinued.    Subcutaneous Lovenox for DVT prophylaxis.  No bleeding episodes.    He was using hydrocodone and Robaxin as needed for pain control with good results.  Acute blood loss anemia 9.9.   Blood pressure is controlled with Coreg.    Blood sugars, Hemoglobin A1c 5.7.  Lantus insulin as directed.  Diabetic teaching.    Morbid obesity.  Encourage weight loss, dietary followup.    The patient received weekly collaborative interdisciplinary team conferences to discuss estimated length of stay, family teaching, any barriers to discharge.  He self propelled his wheelchair independently.  Practiced wheelchair mobility in household  environment by weaving around cones, both forwards and backwards.  Maintaining nonweightbearing to right lower extremity.  Activities of daily living and homemaking:  Bed mobility completed modified independent using hospital bed functions, sliding board  transfers at edge of bed with setup assistance.  Completed sliding board transfers.  Drop arm bedside commode minimal assist.  Full family teaching was completed and planned discharge to home.  DISCHARGE MEDICATIONS:  Included aspirin 325 mg daily, Coreg 25 mg p.o. b.i.d., Lantus insulin 42 units daily,  Linzess 145 mcg daily, Flomax 0.4 mg daily,  hydrocodone 1 tablet every 4 hours as needed for pain, Robaxin 500 mg every 6 hours as needed for  muscle spasms.  DIET:  Diabetic diet.  FOLLOWUP:  The patient would follow up with Dr. Delice Lesch at the outpatient rehab service office as advised;  Dr. Meridee Score, orthopedic services, call for appointment; Dr. Viviana Simpler, medical management.  AN/NUANCE D:01/01/2018 T:01/02/2018 JOB:[id number]  Patient seen and examined by me on day of discharge. Delice Lesch, MD, ABPMR

## 2018-01-02 NOTE — Progress Notes (Signed)
Sterile water added to chamber of CPAP.  Pt states he doesn't need assistance, he will place himself on CPAP.

## 2018-01-02 NOTE — Progress Notes (Signed)
Social Work  Discharge Note  The overall goal for the admission was met for:DC SAT 12/28   Discharge location: Yes-HOME WITH LIBBY WHO CAN PROVIDE SUPERVISION LEVEL  Length of Stay: Yes-8 DAYS  Discharge activity level: Yes-MOD/I [place]  Home/community participation: Yes  Services provided included: MD, RD, PT, OT, RN, CM, Pharmacy and SW  Financial Services: Medicare and Private Insurance: [place]  Follow-up services arranged: Home Health: [facility], DME: [place] and Patient/Family has no preference for HH/DME agencies  Comments (or additional information):LIB[facility] WAS [place] AND READY TO [place].  Patient/Family verbalized understanding of follow-up arrangements: Yes  Individual responsible for coordination of the follow-up plan: SELF & LIBBY-GIRLFRIEND  Confirmed correct DME delivered: Elease Hashimoto 01/02/2018    Elease Hashimoto

## 2018-01-02 NOTE — Progress Notes (Addendum)
[facility] PHYSICAL MEDICINE & REHABILITATION PROGRESS NOTE  Subjective/Complaints: Pt seen laying in bed this morning.  He states he slept well overnight.  He states he is looking for discharge tomorrow.  ROS: Denies CP, shortness of breath, nausea, vomiting, diarrhea.  Objective: Vital Signs: Blood pressure 138/69, pulse 88, temperature 98.4 F (36.9 C), temperature source Oral, resp. rate 20, height 6\' 2"  (1.88 m), weight 124 kg, SpO2 99 %. No results found. Recent Labs    01/02/18 0555  WBC 10.1  HGB 10.3*  HCT 31.6*  PLT 227   Recent Labs    01/02/18 0555  NA 138  K 4.1  CL 105  CO2 23  GLUCOSE 226*  BUN 24*  CREATININE 0.93  CALCIUM 8.7*    Physical Exam: BP 138/69 (BP Location: Left Arm)   Pulse 88   Temp 98.4 F (36.9 C) (Oral)   Resp 20   Ht 6\' 2"  (1.88 m)   Wt 124 kg   SpO2 99%   BMI 35.10 kg/m  Constitutional: Well-developed. Obese  HENT: Normocephalic and atraumatic.  Eyes: EOMI.  No discharge. Cardiovascular: RRR.  No JVD. Respiratory: Effort normal and breath sounds normal.  GI: Bowel sounds are normal. He exhibits no distension.  Musculoskeletal: Left AKA Right fifth ray amputation  Neurological: He is alert and oriented Motor: Bilateral upper extremities: 4+/5 proximal distal, stable Left lower extremity: 4/5 hip flexion (pain inhibition), stable Right lower extremity: 4/5 proximal distal (some pain inhibition) Skin: Left AKA with dressing C/D/I Left fifth ray amputation with dressing C/D/I Psychiatric: He has a normal mood and affect. His behavior is normal. Thought content normal.   Assessment/Plan: 1. Functional deficits secondary to left AKA with right fifth ray amputation which require 3+ hours per day of interdisciplinary therapy in a comprehensive inpatient rehab setting.  Physiatrist is providing close team supervision and 24 hour management of active medical problems listed below.  Physiatrist and rehab team continue to assess  barriers to discharge/monitor patient progress toward functional and medical goals  Care Tool:  Bathing    Body parts bathed by patient: Right arm, Left arm, Chest, Abdomen, Front perineal area, Buttocks, Right upper leg, Left upper leg, Face     Body parts n/a: Left lower leg, Right lower leg   Bathing assist Assist Level: Independent     Upper Body Dressing/Undressing Upper body dressing   What is the patient wearing?: Pull over shirt    Upper body assist Assist Level: Independent    Lower Body Dressing/Undressing Lower body dressing      What is the patient wearing?: Pants     Lower body assist Assist for lower body dressing: Independent     Toileting Toileting    Toileting assist Assist for toileting: Independent     Transfers Chair/bed transfer  Transfers assist  Chair/bed transfer activity did not occur: Safety/medical concerns  Chair/bed transfer assist level: Supervision/Verbal cueing     Locomotion Ambulation   Ambulation assist   Ambulation activity did not occur: Safety/medical concerns(NWB BLE)          Walk 10 feet activity   Assist  Walk 10 feet activity did not occur: Safety/medical concerns        Walk 50 feet activity   Assist Walk 50 feet with 2 turns activity did not occur: Safety/medical concerns         Walk 150 feet activity   Assist Walk 150 feet activity did not occur: Safety/medical concerns  Walk 10 feet on uneven surface  activity   Assist Walk 10 feet on uneven surfaces activity did not occur: Safety/medical concerns         Wheelchair     Assist Will patient use wheelchair at discharge?: Yes Type of Wheelchair: Manual    Wheelchair assist level: Supervision/Verbal cueing Max wheelchair distance: 150    Wheelchair 50 feet with 2 turns activity    Assist        Assist Level: Supervision/Verbal cueing   Wheelchair 150 feet activity     Assist     Assist Level:  Supervision/Verbal cueing      Medical Problem List and Plan: 1.   Decreased functional mobility secondary to left AKA secondary to osteomyelitis as well as right fifth ray amputation 12/24/2017.Nonweightbearing bilateral lower extremities  Continue CIR  Plan for d/c tomorrow  Will see patient for transitional care management in 1-2 weeks post-discharge 2.  DVT Prophylaxis/Anticoagulation: Subcutaneous Lovenox. Monitor for any bleeding episodes 3. Pain Management:  Hydrocodone as needed, Robaxin as needed.   4. Mood:  Provide emotional support 5. Neuropsych: This patient is capable of making decisions on his own behalf. 6. Skin/Wound Care:  Routine skin checks  D/Ced VACs  7. Fluids/Electrolytes/Nutrition:  Routine in and out's  BMP within acceptable range on 12/27 8. Acute blood loss anemia.   Hemoglobin 10.3 on 12/27  Continue to monitor 9. Hypertension. Coreg 25 mg twice a day  Labile on 12/27 10. Diabetes mellitus with peripheral neuropathy. Latest hemoglobin A1c 5.7. Lantus insulin 42 units daily. Check blood sugars before meals and at bedtime. Diabetic teaching  Elevated, but stabilizing on 12/27  Will need ambulatory adjustments 11.  Morbid obesity: Encouraged weight loss 12.  Hypoalbuminemia  Supplement initiated on 12/23  LOS: 7 days A FACE TO FACE EVALUATION WAS PERFORMED  Unique Sillas Lorie Phenix 01/02/2018, 8:47 AM

## 2018-01-02 NOTE — Progress Notes (Signed)
Occupational Therapy Session Note  Patient Details  Name: [name] MRN: [id number] Date of Birth: [birth year]  Today's Date: 01/02/2018 OT Individual Time: 3007-6226 and 1330-1400 OT Individual Time Calculation (min): 60 min and 30 min   Short Term Goals: Week 1:  OT Short Term Goal 1 (Week 1): Pt will complete lateral scoot to BSC with (S) OT Short Term Goal 2 (Week 1): Pt will complete peri hygiene via lateral leans with (S) on BSC OT Short Term Goal 3 (Week 1): Pt will don LB clothing EOB with (S)   Skilled Therapeutic Interventions/Progress Updates:    Session One: Pt seen for OT ADL bathing/dressing session. Pt in supine upon arrival, denied pain and agreeable to tx session. Pt desiring to shower this session. Bed mobility completed mod I using hospital bed functions. He transferred EOB>w/c via sliding board with assist to stabilize equipment, able to place and remove sliding board independently. He transferred onto drop arm BSC in shower in same manner as described above using grab bars to assist. He completed bathing task seated on BSC mod I. He donned shorts seated on BSC with set-up prop to sliding board transfer back into w/c. Shorts donned mod I via lateral leans. Completed sliding board transfer back into w/c with assist to stabilize equipment. Mod I transfer back to sitting EOB where pt dressed mod I. Required min A to don L shrinker Education provided regarding shrinker care and wear schedule. Pt returned to w/c at end of session, left seated with all needs in reach.   Session Two: OT session focusing on functional transfers and UE strengthening/activity tolerance.  Sliding board transfer w/c <> drop arm BSC at supervision-mod I level. Recommending pt have supervision at d/c in order to stabilize equipment if needed. Educated regarding need to use BSC vs standard toilet for safety during sliding board transfers.   W/c propulsion throughout unit mod I.  Completed 6 minutes  on SCI Fit for UE strengthening/ endurance, 3 minutes forward, 3 minutes backwards on level 5.  Pt returned to room at end of session, left seated in w/c with all needs in reach.   Therapy Documentation Precautions:  Precautions Precautions: Fall Precaution Comments: wound vacs BLE Restrictions Weight Bearing Restrictions: Yes RLE Weight Bearing: Non weight bearing LLE Weight Bearing: Non weight bearing Pain:   No/denies pain   Therapy/Group: Individual Therapy  Trisha Ken L 01/02/2018, 6:48 AM

## 2018-01-02 NOTE — Plan of Care (Signed)
  Problem: Consults Goal: RH LIMB LOSS PATIENT EDUCATION Description Description: See Patient Education module for eduction specifics. Outcome: Progressing   Problem: RH BOWEL ELIMINATION Goal: RH STG MANAGE BOWEL WITH ASSISTANCE Description STG Manage Bowel with  Mod I Assistance.  Outcome: Progressing Goal: RH STG MANAGE BOWEL W/MEDICATION W/ASSISTANCE Description STG Manage Bowel with Medication with mod I Assistance.  Outcome: Progressing   Problem: RH SKIN INTEGRITY Goal: RH STG SKIN FREE OF INFECTION/BREAKDOWN Description Manage skin with min assist  Outcome: Progressing Goal: RH STG ABLE TO PERFORM INCISION/WOUND CARE W/ASSISTANCE Description STG Able To Perform Incision/Wound Care With min Assistance.  Outcome: Progressing   Problem: RH SAFETY Goal: RH STG ADHERE TO SAFETY PRECAUTIONS W/ASSISTANCE/DEVICE Description STG Adhere to Safety Precautions With cues/reminders Assistance/Device.  Outcome: Progressing   Problem: RH PAIN MANAGEMENT Goal: RH STG PAIN MANAGED AT OR BELOW PT'S PAIN GOAL Description At or below level 4  Outcome: Progressing   Problem: RH KNOWLEDGE DEFICIT LIMB LOSS Goal: RH STG INCREASE KNOWLEDGE OF SELF CARE AFTER LIMB LOSS Description Pt will be able to perform or direct others to perform care at discharge using handouts/resources/references, etc independently  Outcome: Progressing

## 2018-01-02 NOTE — Progress Notes (Signed)
Patient places himself on CPAP and will call if any further assistance needed. Sterile water was added to the humidity chamber for patient.

## 2018-01-03 LAB — GLUCOSE, CAPILLARY: Glucose-Capillary: 176 mg/dL — ABNORMAL HIGH (ref 70–99)

## 2018-01-03 NOTE — Progress Notes (Signed)
Pt DC today home with fiance. Belongings packed up and ready to go. Pt denies pain. Libby parking at [employer] to [employer]. Pt and belongings in vehicle safely.   Erie Noe, LPN

## 2018-01-05 ENCOUNTER — Telehealth: Payer: Self-pay

## 2018-01-05 ENCOUNTER — Telehealth (INDEPENDENT_AMBULATORY_CARE_PROVIDER_SITE_OTHER): Payer: Self-pay

## 2018-01-05 NOTE — Telephone Encounter (Addendum)
Transitional Care call  Patient name: [name]) DOB: (03/24/1950) 1. Are you/is patient experiencing any problems since coming home? (YES, [place], THERE IS AN INCREASE IN THE DRAINING ON THE AMPUTATION SITE, PICTURES WERE TAKEN AND DR. DUDA WAS INFORMED, NOTED IN PATIENTS Epic CHART THAT VERBAL ORDERS WERE REQUESTED FOR WOUND CARE. a. Are there any questions regarding any aspect of care? (NA) 2. Are there any questions regarding medications administration/dosing? (N0) a. Are meds being taken as prescribed? (NA) b. "Patient should review meds with caller to confirm"  3. Have there been any falls? (NO) 4. Has Home Health been to the house and/or have they contacted you? (YES) a. If not, have you tried to contact them? (NA) b. Can we help you contact them? (NA) 5. Are bowels and bladder emptying properly? (YES) a. Are there any unexpected incontinence issues? (NA) b. If applicable, is patient following bowel/bladder programs? (NA) 6. Any fevers, problems with breathing, unexpected pain? (NO) 7. Are there any skin problems or new areas of breakdown? (NO) 8. Has the patient/family member arranged specialty MD follow up (ie cardiology/neurology/renal/surgical/etc.)?  (YES) a. Can we help arrange? (NA) 9. Does the patient need any other services or support that we can help arrange? (NO) 10. Are caregivers following through as expected in assisting the patient? (YES) 11. Has the patient quit smoking, drinking alcohol, or using drugs as recommended? (NA)  Appointment date/time (01-16-2018 / 120pm), arrive time (100pm) and who it is with here (Dr. Posey Pronto) [place]

## 2018-01-05 NOTE — Telephone Encounter (Signed)
[name] with [facility] called stating that patient needs wound care orders for both of his incision lines.  Stated that sutures are still intact and that his incision lines were wet and draining.  Would like verbal orders for nursing for 3 x week for 2 weeks, 2 x week for 2 weeks, 1 x week for 2 weeks, and 3 PRN's.  CB# is [phone].  Please advise.  Thank you.

## 2018-01-06 ENCOUNTER — Telehealth: Payer: Self-pay

## 2018-01-06 ENCOUNTER — Ambulatory Visit (INDEPENDENT_AMBULATORY_CARE_PROVIDER_SITE_OTHER): Payer: Medicare Other | Admitting: Physician Assistant

## 2018-01-06 ENCOUNTER — Encounter (INDEPENDENT_AMBULATORY_CARE_PROVIDER_SITE_OTHER): Payer: Self-pay | Admitting: Physician Assistant

## 2018-01-06 VITALS — Ht 74.0 in | Wt 273.4 lb

## 2018-01-06 DIAGNOSIS — Z89612 Acquired absence of left leg above knee: Secondary | ICD-10-CM

## 2018-01-06 DIAGNOSIS — Z794 Long term (current) use of insulin: Secondary | ICD-10-CM

## 2018-01-06 DIAGNOSIS — I87331 Chronic venous hypertension (idiopathic) with ulcer and inflammation of right lower extremity: Secondary | ICD-10-CM

## 2018-01-06 DIAGNOSIS — L97919 Non-pressure chronic ulcer of unspecified part of right lower leg with unspecified severity: Secondary | ICD-10-CM

## 2018-01-06 DIAGNOSIS — E1142 Type 2 diabetes mellitus with diabetic polyneuropathy: Secondary | ICD-10-CM

## 2018-01-06 MED ORDER — SULFAMETHOXAZOLE-TRIMETHOPRIM 800-160 MG PO TABS: 1.0000 | ORAL_TABLET | Freq: Two times a day (BID) | ORAL | 0 refills | Status: DC

## 2018-01-06 MED ORDER — PENTOXIFYLLINE ER 400 MG PO TBCR: 400.0000 mg | EXTENDED_RELEASE_TABLET | Freq: Three times a day (TID) | ORAL | 11 refills | Status: DC

## 2018-01-06 NOTE — Telephone Encounter (Signed)
Okay to delay follow up with me for a few weeks He is getting needed follow up for his recent surgery

## 2018-01-06 NOTE — Progress Notes (Signed)
Office Visit Note   Patient: [name]           Date of Birth: 17-Aug-1950           MRN: [id number] Visit Date: 01/06/2018              Requested by: [name], MD [address] [place], [place] [address] PCP: [name], MD  Chief Complaint  Patient presents with  . Right Foot - Routine Post Op    5th toe amp post/op  . Left Leg - Routine Post Op    AKA post op      HPI: The patient is a 67 yo gentleman here with is girlfriend for post operative follow up following left above the knee amputation and right foot 5th ray amputation on 12/24/2017. He went to inpatient rehabilitation post acute care. He reports he has been doing okay since discharge but has had limited follow up following discharge. He was discharged from rehab 01/03/18. He reports he has been trying to stay off the right foot and has been wearing his stump shrinker stockings.   Assessment & Plan: Visit Diagnoses:  1. Status post above knee amputation, left ([place])   2. Idiopathic chronic venous hypertension of right lower extremity with ulcer and inflammation (HCC)   3. Morbid obesity due to excess calories ([place])   4. Type 2 diabetes mellitus with diabetic polyneuropathy, with long-term current use of insulin (HCC)     Plan: Start Bactrim DS 1 po BID , Trental 400 mg TID and NTG patch to the right foot alternating sites daily. Vive compression sock to the right foot after showering and cleaning the right foot daily. Continue stump shrinker stocking to the left above the knee amputation. Sutures left in place. Follow up in 1 week.   Follow-Up Instructions: Return in about 1 week (around 01/13/2018).   Ortho Exam  Patient is alert, oriented, no adenopathy, well-dressed, normal affect, normal respiratory effort. The left above the knee amputation site is healing well with minimal edema. Sutures intact.  Right foot with erythema and edema about the incisional site with scant milky drainage.  Palpable pedal pulse.   Imaging: No results found. No images are attached to the encounter.  Labs: Lab Results  Component Value Date   HGBA1C 6.4 (A) 12/12/2017   HGBA1C 7.3 (A) 08/05/2017   HGBA1C 6.3 03/06/2017   ESRSEDRATE 35 (H) 03/31/2013   CRP 5.3 (H) 03/31/2013   REPTSTATUS 12/25/2017 FINAL 12/20/2017   GRAMSTAIN  12/30/2012    FEW WBC PRESENT, PREDOMINANTLY PMN RARE SQUAMOUS EPITHELIAL CELLS PRESENT ABUNDANT GRAM NEGATIVE RODS MODERATE GRAM POSITIVE COCCI IN PAIRS IN CLUSTERS   CULT  12/20/2017    NO GROWTH 5 DAYS Performed at [facility], [place] [address] [place], [place] [address]    [place] OXYTOCA 12/15/2012   LABORGA PSEUDOMONAS AERUGINOSA 12/15/2012     Lab Results  Component Value Date   ALBUMIN 2.6 (L) 12/27/2017   ALBUMIN 2.7 (L) 12/26/2017   ALBUMIN 3.5 12/14/2017    Body mass index is 35.1 kg/m.  Orders:  No orders of the defined types were placed in this encounter.  Meds ordered this encounter  Medications  . pentoxifylline (TRENTAL) 400 MG CR tablet    Sig: Take 1 tablet (400 mg total) by mouth 3 (three) times daily with meals.    Dispense:  90 tablet    Refill:  11  . sulfamethoxazole-trimethoprim (BACTRIM  DS,SEPTRA DS) 800-160 MG tablet    Sig: Take 1 tablet by mouth 2 (two) times daily.    Dispense:  28 tablet    Refill:  0  . nitroGLYCERIN (NITRODUR - DOSED IN MG/24 HR) 0.4 mg/hr patch    Sig: Place 1 patch (0.4 mg total) onto the skin daily.    Dispense:  30 patch    Refill:  12    Apply to right foot daily and alternate the location of the patch on the right foot.     Procedures: No procedures performed  Clinical Data: No additional findings.  ROS:  All other systems negative, except as noted in the HPI. Review of Systems  Objective: Vital Signs: Ht 6\' 2"  (1.88 m)   Wt 273 lb 5.9 oz (124 kg)   BMI 35.10 kg/m   Specialty Comments:  No specialty comments available.  PMFS History: Patient Active  Problem List   Diagnosis Date Noted  . Hypoalbuminemia due to protein-calorie malnutrition ([place])   . Type 2 diabetes mellitus with peripheral neuropathy (HCC)   . Labile blood pressure   . Labile blood glucose   . Left above-knee amputee ([place]) 12/26/2017  . Unilateral AKA, left ([place])   . History of complete ray amputation of fifth toe of right foot ([place])   . Postoperative pain   . Acute blood loss anemia   . Essential hypertension   . Poorly controlled type 2 diabetes mellitus with peripheral neuropathy ([place])   . Subacute osteomyelitis, right ankle and foot ([place])   . Cellulitis of extremity 12/20/2017  . Cellulitis and abscess of left lower extremity 12/20/2017  . PAD (peripheral artery disease) ([place]) 02/19/2017  . Chronic renal disease, stage III ([place]) 02/19/2017  . Morbid obesity ([place]) 11/05/2016  . Ulcer of toe of right foot, limited to breakdown of skin ([place]) 10/11/2016  . Non-pressure chronic ulcer of left calf, limited to breakdown of skin ([place]) 06/04/2016  . Idiopathic chronic venous hypertension of right lower extremity with ulcer and inflammation ([place]) 06/04/2016  . Left below-knee amputee ([place]) 02/23/2016  . Type 2 diabetes mellitus with diabetic polyneuropathy, with long-term current use of insulin ([place]) 03/29/2015  . Advance directive discussed with patient 02/07/2014  . Normocytic anemia 04/01/2013  . Diabetic Charcot foot ([place]) 04/01/2013  . Routine general medical examination at a health care facility 12/25/2010  . HEMORRHOIDS-INTERNAL 11/22/2009  . PERSONAL HX COLONIC POLYPS 11/22/2009  . Venous (peripheral) insufficiency 06/29/2008  . Osteoarthritis, multiple sites 04/03/2007  . ERECTILE DYSFUNCTION, ORGANIC 06/13/2006  . Hyperlipemia 06/11/2006  . GLAUCOMA 06/11/2006  . Coronary atherosclerosis of native coronary artery 06/11/2006  . Obstructive sleep apnea 06/11/2006   Past Medical History:  Diagnosis Date  . Coronary atherosclerosis of unspecified type of  vessel, native or graft   . Diabetes mellitus without complication ([place])   . Hemorrhage of rectum and anus   . History of kidney stones   . Impotence of organic origin   . Internal hemorrhoids without mention of complication   . Malignant neoplasm of prostate ([place])   . Mononeuritis of unspecified site   . Osteoarthrosis, unspecified whether generalized or localized, unspecified site   . Other and unspecified hyperlipidemia   . Personal history of colonic polyps   . Personal history of diabetic foot ulcer    saw wound center, resolved 05/08/2010  . Personal history of gallstones   . Routine general medical examination at a health care facility   . Type II or  unspecified type diabetes mellitus with neurological manifestations, not stated as uncontrolled(250.60)   . Unspecified glaucoma(365.9)   . Unspecified sleep apnea    cpcp  . Unspecified venous (peripheral) insufficiency     Family History  Problem Relation Age of Onset  . Lung cancer Father   . Multiple sclerosis Mother   . Heart attack Other        paternal aunts and uncles  . Peripheral vascular disease Maternal Grandfather        several amputations    Past Surgical History:  Procedure Laterality Date  . AMPUTATION Left 12/30/2012   Procedure: AMPUTATION RAY ;  Surgeon: Meredith Pel, MD;  Location: [facility];  Service: Orthopedics;  Laterality: Left;  LEFT GREAT TOE RAY AMPUTATION  . AMPUTATION Left 02/23/2016   Procedure: Left Below Knee Amputation;  Surgeon: [name], MD;  Location: [facility];  Service: Orthopedics;  Laterality: Left;  . AMPUTATION Left 12/24/2017   Procedure: LEFT ABOVE KNEE AMPUTATION;  Surgeon: [name], MD;  Location: [facility];  Service: Orthopedics;  Laterality: Left;  . AMPUTATION Right 12/24/2017   Procedure: RIGHT 5TH RAY AMPUTATION;  Surgeon: [name], MD;  Location: [facility];  Service: Orthopedics;  Laterality: Right;  . APPENDECTOMY    . BASAL CELL CARCINOMA EXCISION  2/16   left  forearm  . CARDIAC CATHETERIZATION  1998   Negative  . CATARACT EXTRACTION Right 2017   then lid surgery  . CORONARY ARTERY BYPASS GRAFT  09/2005   Post op AFIB  . EYE SURGERY    . FOOT BONE EXCISION Left 11/03/2013   DR DUDA   . I&D EXTREMITY Left 11/03/2013   Procedure: Left Foot Partial Bone Excision Cuboid and Medial Cuneiform, Wound Closures;  Surgeon: [name], MD;  Location: [facility];  Service: Orthopedics;  Laterality: Left;  . INSERTION PROSTATE RADIATION SEED  2009   RT and seeds for prostate cancer  . KIDNEY STONE SURGERY  04/1993  . RCA stents  04/2003   EF 55%  . RETINAL DETACHMENT SURGERY  2002-2003  . thrombosed vein  1993   Right leg   Social History   Occupational History  . Occupation: Heavy equipment mechanic--retired  Tobacco Use  . Smoking status: Former Smoker    Packs/day: 2.00    Years: 35.00    Pack years: 70.00    Types: Cigars, Cigarettes    Last attempt to quit: 01/07/2001    Years since quitting: 17.0  . Smokeless tobacco: Never Used  Substance and Sexual Activity  . Alcohol use: Not Currently    Comment: rare  . Drug use: No  . Sexual activity: Yes    Partners: Female

## 2018-01-06 NOTE — Telephone Encounter (Signed)
Called and advised that pt is to wear shrinker on left and sock on right foot for 5th ray amp. He was started on trental and nitro patch and will follow up in the office. HHN will go out once a week to eval compliance

## 2018-01-06 NOTE — Telephone Encounter (Signed)
No TCM call done due to Dr Serita Grit office did this on 01/05/18. Called patient to schedule an appointment to follow up with Dr [name]. Spoke with patient's girlfriend [name]. Patient was busy with physical therapy and will call back to schedule an appointment. He does have appointments with orthopedic and physiatrist office.

## 2018-01-09 ENCOUNTER — Encounter (INDEPENDENT_AMBULATORY_CARE_PROVIDER_SITE_OTHER): Payer: Self-pay | Admitting: Physician Assistant

## 2018-01-09 ENCOUNTER — Telehealth (INDEPENDENT_AMBULATORY_CARE_PROVIDER_SITE_OTHER): Payer: Self-pay | Admitting: Orthopedic Surgery

## 2018-01-09 MED ORDER — NITROGLYCERIN 0.4 MG/HR TD PT24: 0.4000 mg | MEDICATED_PATCH | Freq: Every day | TRANSDERMAL | 12 refills | Status: DC

## 2018-01-09 NOTE — Telephone Encounter (Signed)
Patient's wife called wanting to know if the patient can take a shower and wash his stump.  CB#[phone].  Thank you.

## 2018-01-12 NOTE — Telephone Encounter (Signed)
Called to advise ok to shower.

## 2018-01-12 NOTE — Telephone Encounter (Signed)
Pt is s/p a recent left AKA ok to wash area?

## 2018-01-12 NOTE — Telephone Encounter (Signed)
yes

## 2018-01-13 ENCOUNTER — Ambulatory Visit (INDEPENDENT_AMBULATORY_CARE_PROVIDER_SITE_OTHER): Payer: Medicare Other | Admitting: Orthopedic Surgery

## 2018-01-13 ENCOUNTER — Encounter (INDEPENDENT_AMBULATORY_CARE_PROVIDER_SITE_OTHER): Payer: Self-pay | Admitting: Orthopedic Surgery

## 2018-01-13 DIAGNOSIS — Z89612 Acquired absence of left leg above knee: Secondary | ICD-10-CM

## 2018-01-13 NOTE — Progress Notes (Signed)
Office Visit Note   Patient: [name]           Date of Birth: April 27, 1950           MRN: [id number] Visit Date: 01/13/2018              Requested by: [name], MD [address] [place], [place] [address] PCP: [name], MD  Chief Complaint  Patient presents with  . Left Knee - Routine Post Op      HPI: Patient is a 68 year old gentleman who presents 3 weeks status post left above-the-knee amputation and right fifth ray amputation.  Patient denies any problems he is currently on antibiotics and using a nitroglycerin patch he states that everything is looking well.  Assessment & Plan: Visit Diagnoses:  1. Status post above knee amputation, left ([place])     Plan: Patient will continue the nitroglycerin patch on the right sutures harvested today he is already following up with [employer] for prosthetic fitting.  Continue nonweightbearing on the right follow-up in 2 weeks.  Follow-Up Instructions: Return in about 2 weeks (around 01/27/2018).   Ortho Exam  Patient is alert, oriented, no adenopathy, well-dressed, normal affect, normal respiratory effort. Examination patient's left above-the-knee amputation is well-healed.  The sutures are harvested today stump shrinker reapplied.  Patient has less dermatitis on the right foot status post fifth ray amputation we will harvest the sutures today continue with his compression stocking.  Continue nonweightbearing.  Imaging: No results found. No images are attached to the encounter.  Labs: Lab Results  Component Value Date   HGBA1C 6.4 (A) 12/12/2017   HGBA1C 7.3 (A) 08/05/2017   HGBA1C 6.3 03/06/2017   ESRSEDRATE 35 (H) 03/31/2013   CRP 5.3 (H) 03/31/2013   REPTSTATUS 12/25/2017 FINAL 12/20/2017   GRAMSTAIN  12/30/2012    FEW WBC PRESENT, PREDOMINANTLY PMN RARE SQUAMOUS EPITHELIAL CELLS PRESENT ABUNDANT GRAM NEGATIVE RODS MODERATE GRAM POSITIVE COCCI IN PAIRS IN CLUSTERS   CULT  12/20/2017    NO GROWTH  5 DAYS Performed at [facility], [place] [address] [place], [place] [address]    [place] OXYTOCA 12/15/2012   LABORGA PSEUDOMONAS AERUGINOSA 12/15/2012     Lab Results  Component Value Date   ALBUMIN 2.6 (L) 12/27/2017   ALBUMIN 2.7 (L) 12/26/2017   ALBUMIN 3.5 12/14/2017    There is no height or weight on file to calculate BMI.  Orders:  No orders of the defined types were placed in this encounter.  No orders of the defined types were placed in this encounter.    Procedures: No procedures performed  Clinical Data: No additional findings.  ROS:  All other systems negative, except as noted in the HPI. Review of Systems  Objective: Vital Signs: There were no vitals taken for this visit.  Specialty Comments:  No specialty comments available.  PMFS History: Patient Active Problem List   Diagnosis Date Noted  . Hypoalbuminemia due to protein-calorie malnutrition ([place])   . Type 2 diabetes mellitus with peripheral neuropathy (HCC)   . Labile blood pressure   . Labile blood glucose   . Left above-knee amputee ([place]) 12/26/2017  . Unilateral AKA, left ([place])   . History of complete ray amputation of fifth toe of right foot ([place])   . Postoperative pain   . Acute blood loss anemia   . Essential hypertension   . Poorly controlled type 2 diabetes mellitus with peripheral neuropathy ([place])   .  Subacute osteomyelitis, right ankle and foot ([place])   . Cellulitis of extremity 12/20/2017  . Cellulitis and abscess of left lower extremity 12/20/2017  . PAD (peripheral artery disease) ([place]) 02/19/2017  . Chronic renal disease, stage III ([place]) 02/19/2017  . Morbid obesity ([place]) 11/05/2016  . Ulcer of toe of right foot, limited to breakdown of skin ([place]) 10/11/2016  . Non-pressure chronic ulcer of left calf, limited to breakdown of skin ([place]) 06/04/2016  . Idiopathic chronic venous hypertension of right lower extremity with ulcer and inflammation ([place]) 06/04/2016  .  Left below-knee amputee ([place]) 02/23/2016  . Type 2 diabetes mellitus with diabetic polyneuropathy, with long-term current use of insulin ([place]) 03/29/2015  . Advance directive discussed with patient 02/07/2014  . Normocytic anemia 04/01/2013  . Diabetic Charcot foot ([place]) 04/01/2013  . Routine general medical examination at a health care facility 12/25/2010  . HEMORRHOIDS-INTERNAL 11/22/2009  . PERSONAL HX COLONIC POLYPS 11/22/2009  . Venous (peripheral) insufficiency 06/29/2008  . Osteoarthritis, multiple sites 04/03/2007  . ERECTILE DYSFUNCTION, ORGANIC 06/13/2006  . Hyperlipemia 06/11/2006  . GLAUCOMA 06/11/2006  . Coronary atherosclerosis of native coronary artery 06/11/2006  . Obstructive sleep apnea 06/11/2006   Past Medical History:  Diagnosis Date  . Coronary atherosclerosis of unspecified type of vessel, native or graft   . Diabetes mellitus without complication ([place])   . Hemorrhage of rectum and anus   . History of kidney stones   . Impotence of organic origin   . Internal hemorrhoids without mention of complication   . Malignant neoplasm of prostate ([place])   . Mononeuritis of unspecified site   . Osteoarthrosis, unspecified whether generalized or localized, unspecified site   . Other and unspecified hyperlipidemia   . Personal history of colonic polyps   . Personal history of diabetic foot ulcer    saw wound center, resolved 05/08/2010  . Personal history of gallstones   . Routine general medical examination at a health care facility   . Type II or unspecified type diabetes mellitus with neurological manifestations, not stated as uncontrolled(250.60)   . Unspecified glaucoma(365.9)   . Unspecified sleep apnea    cpcp  . Unspecified venous (peripheral) insufficiency     Family History  Problem Relation Age of Onset  . Lung cancer Father   . Multiple sclerosis Mother   . Heart attack Other        paternal aunts and uncles  . Peripheral vascular disease Maternal  Grandfather        several amputations    Past Surgical History:  Procedure Laterality Date  . AMPUTATION Left 12/30/2012   Procedure: AMPUTATION RAY ;  Surgeon: Meredith Pel, MD;  Location[facility];  Service: Orthopedics;  Laterality: Left;  LEFT GREAT TOE RAY AMPUTATION  . AMPUTATION Left 02/23/2016   Procedure: Left Below Knee Amputation;  Surgeon: [name], MD;  Location: [facility];  Service: Orthopedics;  Laterality: Left;  . AMPUTATION Left 12/24/2017   Procedure: LEFT ABOVE KNEE AMPUTATION;  Surgeon: [name], MD;  Location: [facility];  Service: Orthopedics;  Laterality: Left;  . AMPUTATION Right 12/24/2017   Procedure: RIGHT 5TH RAY AMPUTATION;  Surgeon: [name], MD;  Location: [facility];  Service: Orthopedics;  Laterality: Right;  . APPENDECTOMY    . BASAL CELL CARCINOMA EXCISION  2/16   left forearm  . CARDIAC CATHETERIZATION  1998   Negative  . CATARACT EXTRACTION Right 2017   then lid surgery  . CORONARY ARTERY  BYPASS GRAFT  09/2005   Post op AFIB  . EYE SURGERY    . FOOT BONE EXCISION Left 11/03/2013   DR Brytni Dray   . I&D EXTREMITY Left 11/03/2013   Procedure: Left Foot Partial Bone Excision Cuboid and Medial Cuneiform, Wound Closures;  Surgeon: [name], MD;  Location: [facility];  Service: Orthopedics;  Laterality: Left;  . INSERTION PROSTATE RADIATION SEED  2009   RT and seeds for prostate cancer  . KIDNEY STONE SURGERY  04/1993  . RCA stents  04/2003   EF 55%  . RETINAL DETACHMENT SURGERY  2002-2003  . thrombosed vein  1993   Right leg   Social History   Occupational History  . Occupation: Heavy equipment mechanic--retired  Tobacco Use  . Smoking status: Former Smoker    Packs/day: 2.00    Years: 35.00    Pack years: 70.00    Types: Cigars, Cigarettes    Last attempt to quit: 01/07/2001    Years since quitting: 17.0  . Smokeless tobacco: Never Used  Substance and Sexual Activity  . Alcohol use: Not Currently    Comment: rare  . Drug use: No  .  Sexual activity: Yes    Partners: Female

## 2018-01-16 ENCOUNTER — Encounter: Payer: Medicare Other | Admitting: Physical Medicine & Rehabilitation

## 2018-01-21 ENCOUNTER — Other Ambulatory Visit: Payer: Self-pay | Admitting: Internal Medicine

## 2018-01-29 ENCOUNTER — Encounter (INDEPENDENT_AMBULATORY_CARE_PROVIDER_SITE_OTHER): Payer: Self-pay | Admitting: Orthopedic Surgery

## 2018-01-29 ENCOUNTER — Ambulatory Visit (INDEPENDENT_AMBULATORY_CARE_PROVIDER_SITE_OTHER): Payer: Medicare Other | Admitting: Orthopedic Surgery

## 2018-01-29 VITALS — Ht 74.0 in | Wt 273.0 lb

## 2018-01-29 DIAGNOSIS — Z9889 Other specified postprocedural states: Secondary | ICD-10-CM

## 2018-01-29 DIAGNOSIS — L97919 Non-pressure chronic ulcer of unspecified part of right lower leg with unspecified severity: Secondary | ICD-10-CM

## 2018-01-29 DIAGNOSIS — I87331 Chronic venous hypertension (idiopathic) with ulcer and inflammation of right lower extremity: Secondary | ICD-10-CM

## 2018-01-29 DIAGNOSIS — Z89612 Acquired absence of left leg above knee: Secondary | ICD-10-CM

## 2018-01-29 DIAGNOSIS — Z89421 Acquired absence of other right toe(s): Secondary | ICD-10-CM

## 2018-01-30 ENCOUNTER — Encounter (INDEPENDENT_AMBULATORY_CARE_PROVIDER_SITE_OTHER): Payer: Self-pay | Admitting: Orthopedic Surgery

## 2018-01-30 NOTE — Progress Notes (Signed)
Office Visit Note   Patient: [name]           Date of Birth: [birth year]           MRN: [id number] Visit Date: 01/29/2018              Requested by: [name], MD [place], [place] [address] PCP: [name], MD  Chief Complaint  Patient presents with  . Left Leg - Routine Post Op    12/24/17 left AKA      HPI: Patient is a 68 year old gentleman who is status post a right foot fifth ray amputation and a left above-the-knee amputation.  Patient is currently using Trental and a nitroglycerin patch for the right foot.  Patient states there is been no drainage he states he will complete his antibiotic in about a week.  Assessment & Plan: Visit Diagnoses:  1. Status post above knee amputation, left ([place])   2. Idiopathic chronic venous hypertension of right lower extremity with ulcer and inflammation (HCC)   3. History of partial ray amputation of fifth toe of right foot ([place])     Plan: Recommended continuing the nitroglycerin patch and the Trental.  Patient is given prescription for biotech for left above-the-knee amputation prosthesis and custom orthotic for the right [place].  Patient is a Interior and spatial designer  Follow-Up Instructions: No follow-ups on file.   Ortho Exam  Patient is alert, oriented, no adenopathy, well-dressed, normal affect, normal respiratory effort. There is no redness no cellulitis no signs of infection in either foot.  Patient has completed his antibiotics about a week ago.  Imaging: No results found. No images are attached to the encounter.  Labs: Lab Results  Component Value Date   HGBA1C 6.4 (A) 12/12/2017   HGBA1C 7.3 (A) 08/05/2017   HGBA1C 6.3 03/06/2017   ESRSEDRATE 35 (H) 03/31/2013   CRP 5.3 (H) 03/31/2013   REPTSTATUS 12/25/2017 FINAL 12/20/2017   GRAMSTAIN  12/30/2012    FEW WBC PRESENT, PREDOMINANTLY PMN RARE SQUAMOUS EPITHELIAL CELLS PRESENT ABUNDANT GRAM NEGATIVE RODS MODERATE GRAM POSITIVE COCCI  IN PAIRS IN CLUSTERS   CULT  12/20/2017    NO GROWTH 5 DAYS Performed at [facility], [place] [address] [place], [place] [address]    [place] OXYTOCA 12/15/2012   LABORGA PSEUDOMONAS AERUGINOSA 12/15/2012     Lab Results  Component Value Date   ALBUMIN 2.6 (L) 12/27/2017   ALBUMIN 2.7 (L) 12/26/2017   ALBUMIN 3.5 12/14/2017    Body mass index is 35.05 kg/m.  Orders:  No orders of the defined types were placed in this encounter.  No orders of the defined types were placed in this encounter.    Procedures: No procedures performed  Clinical Data: No additional findings.  ROS:  All other systems negative, except as noted in the HPI. Review of Systems  Objective: Vital Signs: Ht 6\' 2"  (1.88 m)   Wt 273 lb (123.8 kg)   BMI 35.05 kg/m   Specialty Comments:  No specialty comments available.  PMFS History: Patient Active Problem List   Diagnosis Date Noted  . Hypoalbuminemia due to protein-calorie malnutrition ([place])   . Type 2 diabetes mellitus with peripheral neuropathy (HCC)   . Labile blood pressure   . Labile blood glucose   . Left above-knee amputee ([place]) 12/26/2017  . Unilateral AKA, left ([place])   . History of complete ray amputation of fifth toe of right foot ([place])   .  Postoperative pain   . Acute blood loss anemia   . Essential hypertension   . Poorly controlled type 2 diabetes mellitus with peripheral neuropathy ([place])   . Subacute osteomyelitis, right ankle and foot ([place])   . Cellulitis of extremity 12/20/2017  . Cellulitis and abscess of left lower extremity 12/20/2017  . PAD (peripheral artery disease) ([place]) 02/19/2017  . Chronic renal disease, stage III ([place]) 02/19/2017  . Morbid obesity ([place]) 11/05/2016  . Ulcer of toe of right foot, limited to breakdown of skin ([place]) 10/11/2016  . Non-pressure chronic ulcer of left calf, limited to breakdown of skin ([place]) 06/04/2016  . Idiopathic chronic venous hypertension of right lower  extremity with ulcer and inflammation ([employer]) 06/04/2016  . Left below-knee amputee ([place]) 02/23/2016  . Type 2 diabetes mellitus with diabetic polyneuropathy, with long-term current use of insulin ([place]) 03/29/2015  . Advance directive discussed with patient 02/07/2014  . Normocytic anemia 04/01/2013  . Diabetic Charcot foot ([place]) 04/01/2013  . Routine general medical examination at a health care facility 12/25/2010  . HEMORRHOIDS-INTERNAL 11/22/2009  . PERSONAL HX COLONIC POLYPS 11/22/2009  . Venous (peripheral) insufficiency 06/29/2008  . Osteoarthritis, multiple sites 04/03/2007  . ERECTILE DYSFUNCTION, ORGANIC 06/13/2006  . Hyperlipemia 06/11/2006  . GLAUCOMA 06/11/2006  . Coronary atherosclerosis of native coronary artery 06/11/2006  . Obstructive sleep apnea 06/11/2006   Past Medical History:  Diagnosis Date  . Coronary atherosclerosis of unspecified type of vessel, native or graft   . Diabetes mellitus without complication ([place])   . Hemorrhage of rectum and anus   . History of kidney stones   . Impotence of organic origin   . Internal hemorrhoids without mention of complication   . Malignant neoplasm of prostate ([place])   . Mononeuritis of unspecified site   . Osteoarthrosis, unspecified whether generalized or localized, unspecified site   . Other and unspecified hyperlipidemia   . Personal history of colonic polyps   . Personal history of diabetic foot ulcer    saw wound center, resolved 05/08/2010  . Personal history of gallstones   . Routine general medical examination at a health care facility   . Type II or unspecified type diabetes mellitus with neurological manifestations, not stated as uncontrolled(250.60)   . Unspecified glaucoma(365.9)   . Unspecified sleep apnea    cpcp  . Unspecified venous (peripheral) insufficiency     Family History  Problem Relation Age of Onset  . Lung cancer Father   . Multiple sclerosis Mother   . Heart attack Other        paternal  aunts and uncles  . Peripheral vascular disease Maternal Grandfather        several amputations    Past Surgical History:  Procedure Laterality Date  . AMPUTATION Left 12/30/2012   Procedure: AMPUTATION RAY ;  Surgeon: Meredith Pel, MD;  Location: [facility];  Service: Orthopedics;  Laterality: Left;  LEFT GREAT TOE RAY AMPUTATION  . AMPUTATION Left 02/23/2016   Procedure: Left Below Knee Amputation;  Surgeon: [name], MD;  Location: [facility];  Service: Orthopedics;  Laterality: Left;  . AMPUTATION Left 12/24/2017   Procedure: LEFT ABOVE KNEE AMPUTATION;  Surgeon: [name], MD;  Location: [facility];  Service: Orthopedics;  Laterality: Left;  . AMPUTATION Right 12/24/2017   Procedure: RIGHT 5TH RAY AMPUTATION;  Surgeon: [name], MD;  Location: [facility];  Service: Orthopedics;  Laterality: Right;  . APPENDECTOMY    . BASAL CELL CARCINOMA EXCISION  2/16   left forearm  . CARDIAC CATHETERIZATION  1998   Negative  . CATARACT EXTRACTION Right 2017   then lid surgery  . CORONARY ARTERY BYPASS GRAFT  09/2005   Post op AFIB  . EYE SURGERY    . FOOT BONE EXCISION Left 11/03/2013   DR Shelvy Perazzo   . I&D EXTREMITY Left 11/03/2013   Procedure: Left Foot Partial Bone Excision Cuboid and Medial Cuneiform, Wound Closures;  Surgeon: [name], MD;  Location: [facility];  Service: Orthopedics;  Laterality: Left;  . INSERTION PROSTATE RADIATION SEED  2009   RT and seeds for prostate cancer  . KIDNEY STONE SURGERY  04/1993  . RCA stents  04/2003   EF 55%  . RETINAL DETACHMENT SURGERY  2002-2003  . thrombosed vein  1993   Right leg   Social History   Occupational History  . Occupation: Heavy equipment mechanic--retired  Tobacco Use  . Smoking status: Former Smoker    Packs/day: 2.00    Years: 35.00    Pack years: 70.00    Types: Cigars, Cigarettes    Last attempt to quit: 01/07/2001    Years since quitting: 17.0  . Smokeless tobacco: Never Used  Substance and Sexual Activity  . Alcohol  use: Not Currently    Comment: rare  . Drug use: No  . Sexual activity: Yes    Partners: Female

## 2018-02-02 ENCOUNTER — Other Ambulatory Visit: Payer: Self-pay | Admitting: Internal Medicine

## 2018-02-11 ENCOUNTER — Encounter (INDEPENDENT_AMBULATORY_CARE_PROVIDER_SITE_OTHER): Payer: Self-pay | Admitting: Family

## 2018-02-11 ENCOUNTER — Ambulatory Visit (INDEPENDENT_AMBULATORY_CARE_PROVIDER_SITE_OTHER): Payer: Medicare Other | Admitting: Family

## 2018-02-11 VITALS — Ht 74.0 in | Wt 273.0 lb

## 2018-02-11 DIAGNOSIS — S78112A Complete traumatic amputation at level between left hip and knee, initial encounter: Secondary | ICD-10-CM

## 2018-02-11 DIAGNOSIS — L97511 Non-pressure chronic ulcer of other part of right foot limited to breakdown of skin: Secondary | ICD-10-CM

## 2018-02-11 DIAGNOSIS — Z89622 Acquired absence of left hip joint: Secondary | ICD-10-CM

## 2018-02-11 DIAGNOSIS — Z89421 Acquired absence of other right toe(s): Secondary | ICD-10-CM

## 2018-02-11 MED ORDER — MUPIROCIN 2 % EX OINT: 1.0000 "application " | TOPICAL_OINTMENT | Freq: Every day | CUTANEOUS | 6 refills | Status: DC

## 2018-02-11 MED ORDER — SULFAMETHOXAZOLE-TRIMETHOPRIM 800-160 MG PO TABS: 1.0000 | ORAL_TABLET | Freq: Two times a day (BID) | ORAL | 0 refills | Status: DC

## 2018-02-11 NOTE — Progress Notes (Signed)
Office Visit Note   Patient: [name]           Date of Birth: 12-14-1950           MRN: [id number] Visit Date: 02/11/2018              Requested by: [name], MD [address] [place], [place] [address] PCP: [name], MD  Chief Complaint  Patient presents with  . Left Leg - Routine Post Op    12/24/17 left AKA and right 5th ray amputation   . Right Foot - Routine Post Op      HPI: Patient is a 68 year old gentleman who is status post a right foot fifth ray amputation and a left above-the-knee amputation.  Left above-the-knee amputation December 24, 2017.  Today presents for an acute concern of worsening of a wound to his lateral right foot along his fifth ray amputation.  Has been wearing the medical compression sock with direct skin contact.  His wife is concerned that he often gets skin tears while pulling on his compression stocking.   They have not yet had any appointments with their prosthetists for left above-the-knee prosthesis fabrication.  Patient is currently using Trental and a nitroglycerin patch for the right foot.  Patient states there is been no drainage he states he will complete his antibiotic in about a week.  Assessment & Plan: Visit Diagnoses:  1. Unilateral AKA, left ([place])   2. History of complete ray amputation of fifth toe of right foot ([place])   3. Ulcer of toe of right foot, limited to breakdown of skin ([place])     Plan: Recommended continuing the nitroglycerin patch and the Trental.  Have called in a prescription for Bactroban for his skin tear as well as the right foot ulcer.  Will have him resume Bactrim concerns for infection.  Encouraged him to follow with biotech.  Patient has prescription for biotech for left above-the-knee amputation prosthesis and custom orthotic for the right [place].   Follow-Up Instructions: Return in about 2 weeks (around 02/25/2018) for as scheduled.   Ortho Exam  Patient is alert, oriented, no  adenopathy, well-dressed, normal affect, normal respiratory effort. On examination of his left residual limb this is well consolidated.  There is a 3 mm scab laterally over the incision this is overall well-healed there is no drainage no erythema no sign of infection.  On examination of his right lower extremity there is trace edema and dry skin.  There is a lateral ulcer on the base of his fifth ray amputation incision this is 15 mm x 4 mm there is 1 mm of depth granulation tissue.  There is no drainage today.  There is some surrounding erythema no ascending cellulitis no odor..  Imaging: No results found. No images are attached to the encounter.  Labs: Lab Results  Component Value Date   HGBA1C 6.4 (A) 12/12/2017   HGBA1C 7.3 (A) 08/05/2017   HGBA1C 6.3 03/06/2017   ESRSEDRATE 35 (H) 03/31/2013   CRP 5.3 (H) 03/31/2013   REPTSTATUS 12/25/2017 FINAL 12/20/2017   GRAMSTAIN  12/30/2012    FEW WBC PRESENT, PREDOMINANTLY PMN RARE SQUAMOUS EPITHELIAL CELLS PRESENT ABUNDANT GRAM NEGATIVE RODS MODERATE GRAM POSITIVE COCCI IN PAIRS IN CLUSTERS   CULT  12/20/2017    NO GROWTH 5 DAYS Performed at [facility], [place] [address], [place], [place] [address]    [place] OXYTOCA 12/15/2012   LABORGA PSEUDOMONAS AERUGINOSA  12/15/2012     Lab Results  Component Value Date   ALBUMIN 2.6 (L) 12/27/2017   ALBUMIN 2.7 (L) 12/26/2017   ALBUMIN 3.5 12/14/2017    Body mass index is 35.05 kg/m.  Orders:  No orders of the defined types were placed in this encounter.  Meds ordered this encounter  Medications  . sulfamethoxazole-trimethoprim (BACTRIM DS,SEPTRA DS) 800-160 MG tablet    Sig: Take 1 tablet by mouth 2 (two) times daily.    Dispense:  60 tablet    Refill:  0  . mupirocin ointment (BACTROBAN) 2 %    Sig: Apply 1 application topically daily.    Dispense:  22 g    Refill:  6     Procedures: No procedures performed  Clinical Data: No additional  findings.  ROS:  All other systems negative, except as noted in the HPI. Review of Systems  Constitutional: Positive for chills. Negative for fever.  Cardiovascular: Negative for leg swelling.  Skin: Positive for color change and wound.    Objective: Vital Signs: Ht 6\' 2"  (1.88 m)   Wt 273 lb (123.8 kg)   BMI 35.05 kg/m   Specialty Comments:  No specialty comments available.  PMFS History: Patient Active Problem List   Diagnosis Date Noted  . Hypoalbuminemia due to protein-calorie malnutrition ([place])   . Type 2 diabetes mellitus with peripheral neuropathy (HCC)   . Labile blood pressure   . Labile blood glucose   . Left above-knee amputee ([place]) 12/26/2017  . Unilateral AKA, left ([place])   . History of complete ray amputation of fifth toe of right foot ([place])   . Postoperative pain   . Acute blood loss anemia   . Essential hypertension   . Poorly controlled type 2 diabetes mellitus with peripheral neuropathy ([place])   . Cellulitis of extremity 12/20/2017  . PAD (peripheral artery disease) ([place]) 02/19/2017  . Chronic renal disease, stage III ([place]) 02/19/2017  . Morbid obesity ([place]) 11/05/2016  . Ulcer of toe of right foot, limited to breakdown of skin ([place]) 10/11/2016  . Idiopathic chronic venous hypertension of right lower extremity with ulcer and inflammation ([place]) 06/04/2016  . Type 2 diabetes mellitus with diabetic polyneuropathy, with long-term current use of insulin ([place]) 03/29/2015  . Advance directive discussed with patient 02/07/2014  . Normocytic anemia 04/01/2013  . Diabetic Charcot foot ([place]) 04/01/2013  . Routine general medical examination at a health care facility 12/25/2010  . HEMORRHOIDS-INTERNAL 11/22/2009  . PERSONAL HX COLONIC POLYPS 11/22/2009  . Venous (peripheral) insufficiency 06/29/2008  . Osteoarthritis, multiple sites 04/03/2007  . ERECTILE DYSFUNCTION, ORGANIC 06/13/2006  . Hyperlipemia 06/11/2006  . GLAUCOMA 06/11/2006  . Coronary  atherosclerosis of native coronary artery 06/11/2006  . Obstructive sleep apnea 06/11/2006   Past Medical History:  Diagnosis Date  . Cellulitis and abscess of left lower extremity 12/20/2017  . Coronary atherosclerosis of unspecified type of vessel, native or graft   . Diabetes mellitus without complication ([place])   . Hemorrhage of rectum and anus   . History of kidney stones   . Impotence of organic origin   . Internal hemorrhoids without mention of complication   . Left below-knee amputee ([place]) 02/23/2016  . Malignant neoplasm of prostate ([place])   . Mononeuritis of unspecified site   . Osteoarthrosis, unspecified whether generalized or localized, unspecified site   . Other and unspecified hyperlipidemia   . Personal history of colonic polyps   . Personal history of diabetic foot ulcer  saw wound center, resolved 05/08/2010  . Personal history of gallstones   . Routine general medical examination at a health care facility   . Subacute osteomyelitis, right ankle and foot ([place])   . Type II or unspecified type diabetes mellitus with neurological manifestations, not stated as uncontrolled(250.60)   . Unspecified glaucoma(365.9)   . Unspecified sleep apnea    cpcp  . Unspecified venous (peripheral) insufficiency     Family History  Problem Relation Age of Onset  . Lung cancer Father   . Multiple sclerosis Mother   . Heart attack Other        paternal aunts and uncles  . Peripheral vascular disease Maternal Grandfather        several amputations    Past Surgical History:  Procedure Laterality Date  . AMPUTATION Left 12/30/2012   Procedure: AMPUTATION RAY ;  Surgeon: Meredith Pel, MD;  [facility];  Service: Orthopedics;  Laterality: Left;  LEFT GREAT TOE RAY AMPUTATION  . AMPUTATION Left 02/23/2016   Procedure: Left Below Knee Amputation;  Surgeon: [name], MD;  Location: [facility];  Service: Orthopedics;  Laterality: Left;  . AMPUTATION Left 12/24/2017   Procedure:  LEFT ABOVE KNEE AMPUTATION;  Surgeon: [name], MD;  Location: [facility];  Service: Orthopedics;  Laterality: Left;  . AMPUTATION Right 12/24/2017   Procedure: RIGHT 5TH RAY AMPUTATION;  Surgeon: [name], MD;  Location: [facility];  Service: Orthopedics;  Laterality: Right;  . APPENDECTOMY    . BASAL CELL CARCINOMA EXCISION  2/16   left forearm  . CARDIAC CATHETERIZATION  1998   Negative  . CATARACT EXTRACTION Right 2017   then lid surgery  . CORONARY ARTERY BYPASS GRAFT  09/2005   Post op AFIB  . EYE SURGERY    . FOOT BONE EXCISION Left 11/03/2013   DR DUDA   . I&D EXTREMITY Left 11/03/2013   Procedure: Left Foot Partial Bone Excision Cuboid and Medial Cuneiform, Wound Closures;  Surgeon: [name], MD;  Location: [facility];  Service: Orthopedics;  Laterality: Left;  . INSERTION PROSTATE RADIATION SEED  2009   RT and seeds for prostate cancer  . KIDNEY STONE SURGERY  04/1993  . RCA stents  04/2003   EF 55%  . RETINAL DETACHMENT SURGERY  2002-2003  . thrombosed vein  1993   Right leg   Social History   Occupational History  . Occupation: Heavy equipment mechanic--retired  Tobacco Use  . Smoking status: Former Smoker    Packs/day: 2.00    Years: 35.00    Pack years: 70.00    Types: Cigars, Cigarettes    Last attempt to quit: 01/07/2001    Years since quitting: 17.1  . Smokeless tobacco: Never Used  Substance and Sexual Activity  . Alcohol use: Not Currently    Comment: rare  . Drug use: No  . Sexual activity: Yes    Partners: Female

## 2018-02-20 ENCOUNTER — Encounter: Payer: Self-pay | Admitting: Internal Medicine

## 2018-02-20 ENCOUNTER — Ambulatory Visit (INDEPENDENT_AMBULATORY_CARE_PROVIDER_SITE_OTHER): Payer: Medicare Other | Admitting: Internal Medicine

## 2018-02-20 VITALS — BP 130/76 | HR 84 | Temp 97.9°F | Ht 74.0 in | Wt 260.0 lb

## 2018-02-20 DIAGNOSIS — Z794 Long term (current) use of insulin: Secondary | ICD-10-CM

## 2018-02-20 DIAGNOSIS — Z1211 Encounter for screening for malignant neoplasm of colon: Secondary | ICD-10-CM

## 2018-02-20 DIAGNOSIS — E46 Unspecified protein-calorie malnutrition: Secondary | ICD-10-CM

## 2018-02-20 DIAGNOSIS — S78112A Complete traumatic amputation at level between left hip and knee, initial encounter: Secondary | ICD-10-CM

## 2018-02-20 DIAGNOSIS — I739 Peripheral vascular disease, unspecified: Secondary | ICD-10-CM | POA: Diagnosis not present

## 2018-02-20 DIAGNOSIS — Z7189 Other specified counseling: Secondary | ICD-10-CM | POA: Diagnosis not present

## 2018-02-20 DIAGNOSIS — E1142 Type 2 diabetes mellitus with diabetic polyneuropathy: Secondary | ICD-10-CM

## 2018-02-20 DIAGNOSIS — N183 Chronic kidney disease, stage 3 unspecified: Secondary | ICD-10-CM

## 2018-02-20 DIAGNOSIS — I10 Essential (primary) hypertension: Secondary | ICD-10-CM

## 2018-02-20 DIAGNOSIS — Z0000 Encounter for general adult medical examination without abnormal findings: Secondary | ICD-10-CM

## 2018-02-20 DIAGNOSIS — Z89421 Acquired absence of other right toe(s): Secondary | ICD-10-CM

## 2018-02-20 DIAGNOSIS — L97511 Non-pressure chronic ulcer of other part of right foot limited to breakdown of skin: Secondary | ICD-10-CM

## 2018-02-20 DIAGNOSIS — E8809 Other disorders of plasma-protein metabolism, not elsewhere classified: Secondary | ICD-10-CM

## 2018-02-20 LAB — HM DIABETES FOOT EXAM

## 2018-02-20 MED ORDER — ROSUVASTATIN CALCIUM 5 MG PO TABS: 5.0000 mg | ORAL_TABLET | ORAL | 3 refills | Status: DC

## 2018-02-20 NOTE — Assessment & Plan Note (Signed)
Mild diastasis at the surgical incision site No drainage now On antibiotic Will watch for now--keeps follow up

## 2018-02-20 NOTE — Progress Notes (Signed)
Subjective:    Patient ID: [name], male    DOB: January 06, 1951, 68 y.o.   MRN: [id number]  HPI Here for Medicare wellness visit and follow up of chronic health conditions With girlfriend Reviewed form and advanced directives Reviewed other doctors No tobacco or alcohol Not really exercising---discussed Vision is okay---had diabetic exam in fall Ongoing hearing loss---no aides as yet No falls No depression or anhedonia Still does housework, cooking, etc. Doesn't do stairs. Stays in W/C No memory problems  Recent AKA on left Also had right 5th toe removed---"infection was everywhere" Has healed up fairly well Continues with Dr [name] every 4 weeks Continues on trental Will work on prosthesis again over time Still on septra  Diabetes control has been good CKD in past---last creatinine was some better Ongoing neuropathy and vascular disease Some ongoing issues with low albumin  Voids okay on medication Nocturia x 1 at most Flow is okay  No chest pain No SOB No dizziness or syncope No palpitations No right foot swelling since surgery  Current Outpatient Medications on File Prior to Visit  Medication Sig Dispense Refill  . acetaminophen (TYLENOL) 325 MG tablet Take 1-2 tablets (325-650 mg total) by mouth every 6 (six) hours as needed for mild pain (pain score 1-3 or temp > 100.5).    Marland Kitchen aspirin 325 MG tablet Take 325 mg by mouth daily.      . carvedilol (COREG) 25 MG tablet Take 1 tablet (25 mg total) by mouth 2 (two) times daily. 180 tablet 3  . DROPLET PEN NEEDLES 31G X 5 MM MISC USE TO INJECT INSULIN FOUR TIMES DAILY AS INSTRUCTED (FOR DIABETES) 400 each 1  . insulin glargine (LANTUS) 100 unit/mL SOPN Inject 0.42 mLs (42 Units total) into the skin daily. 15 mL 11  . linaclotide (LINZESS) 145 MCG CAPS capsule Take 1 capsule (145 mcg total) by mouth daily before breakfast. 30 capsule 1  . metFORMIN (GLUCOPHAGE) 1000 MG tablet TAKE 1 TABLET EVERY DAY 90 tablet 2  .  methocarbamol (ROBAXIN) 500 MG tablet Take 1 tablet (500 mg total) by mouth every 6 (six) hours as needed for muscle spasms. 60 tablet 0  . mupirocin ointment (BACTROBAN) 2 % Apply 1 application topically daily. 22 g 6  . nitroGLYCERIN (NITRODUR - DOSED IN MG/24 HR) 0.4 mg/hr patch Place 1 patch (0.4 mg total) onto the skin daily. 30 patch 12  . oxyCODONE (OXY IR/ROXICODONE) 5 MG immediate release tablet Take 1-2 tablets (5-10 mg total) by mouth every 4 (four) hours as needed for moderate pain (pain score 4-6). 30 tablet 0  . pentoxifylline (TRENTAL) 400 MG CR tablet Take 1 tablet (400 mg total) by mouth 3 (three) times daily with meals. 90 tablet 11  . polyethylene glycol (MIRALAX / GLYCOLAX) packet Take 17 g by mouth daily as needed for mild constipation. 14 each 0  . sulfamethoxazole-trimethoprim (BACTRIM DS,SEPTRA DS) 800-160 MG tablet Take 1 tablet by mouth 2 (two) times daily. 60 tablet 0  . tamsulosin (FLOMAX) 0.4 MG CAPS capsule Take 1 capsule (0.4 mg total) by mouth daily. 30 capsule 0  . vitamin C (ASCORBIC ACID) 500 MG tablet Take 500 mg by mouth 2 (two) times daily.    Marland Kitchen zinc gluconate 50 MG tablet Take 50 mg by mouth daily.     No current facility-administered medications on file prior to visit.     Allergies  Allergen Reactions  . Atorvastatin Other (See Comments)    REACTION: myalgias  .  Ezetimibe Other (See Comments)    Body ache  . Simvastatin Other (See Comments)    Body ache    Past Medical History:  Diagnosis Date  . Cellulitis and abscess of left lower extremity 12/20/2017  . Coronary atherosclerosis of unspecified type of vessel, native or graft   . Diabetes mellitus without complication ([place])   . Hemorrhage of rectum and anus   . History of kidney stones   . Impotence of organic origin   . Internal hemorrhoids without mention of complication   . Left below-knee amputee ([place]) 02/23/2016  . Malignant neoplasm of prostate ([place])   . Mononeuritis of unspecified  site   . Osteoarthrosis, unspecified whether generalized or localized, unspecified site   . Other and unspecified hyperlipidemia   . Personal history of colonic polyps   . Personal history of diabetic foot ulcer    saw wound center, resolved 05/08/2010  . Personal history of gallstones   . Routine general medical examination at a health care facility   . Subacute osteomyelitis, right ankle and foot ([place])   . Type II or unspecified type diabetes mellitus with neurological manifestations, not stated as uncontrolled(250.60)   . Unspecified glaucoma(365.9)   . Unspecified sleep apnea    cpcp  . Unspecified venous (peripheral) insufficiency     Past Surgical History:  Procedure Laterality Date  . AMPUTATION Left 12/30/2012   Procedure: AMPUTATION RAY ;  Surgeon: Meredith Pel, MD;  Location: [facility];  Service: Orthopedics;  Laterality: Left;  LEFT GREAT TOE RAY AMPUTATION  . AMPUTATION Left 02/23/2016   Procedure: Left Below Knee Amputation;  Surgeon: [name], MD;  Location: [facility];  Service: Orthopedics;  Laterality: Left;  . AMPUTATION Left 12/24/2017   Procedure: LEFT ABOVE KNEE AMPUTATION;  Surgeon: [name], MD;  Location: [facility];  Service: Orthopedics;  Laterality: Left;  . AMPUTATION Right 12/24/2017   Procedure: RIGHT 5TH RAY AMPUTATION;  Surgeon: [name], MD;  Location: [facility];  Service: Orthopedics;  Laterality: Right;  . APPENDECTOMY    . BASAL CELL CARCINOMA EXCISION  2/16   left forearm  . CARDIAC CATHETERIZATION  1998   Negative  . CATARACT EXTRACTION Right 2017   then lid surgery  . CORONARY ARTERY BYPASS GRAFT  09/2005   Post op AFIB  . EYE SURGERY    . FOOT BONE EXCISION Left 11/03/2013   DR DUDA   . I&D EXTREMITY Left 11/03/2013   Procedure: Left Foot Partial Bone Excision Cuboid and Medial Cuneiform, Wound Closures;  Surgeon: [name], MD;  Location: [facility];  Service: Orthopedics;  Laterality: Left;  . INSERTION PROSTATE RADIATION SEED  2009     RT and seeds for prostate cancer  . KIDNEY STONE SURGERY  04/1993  . RCA stents  04/2003   EF 55%  . RETINAL DETACHMENT SURGERY  2002-2003  . thrombosed vein  1993   Right leg    Family History  Problem Relation Age of Onset  . Lung cancer Father   . Multiple sclerosis Mother   . Heart attack Other        paternal aunts and uncles  . Peripheral vascular disease Maternal Grandfather        several amputations    Social History   Socioeconomic History  . Marital status: Widowed    Spouse name: Not on file  . Number of children: 3  . Years of education: Not on file  . Highest  education level: Not on file  Occupational History  . Occupation: Heavy equipment mechanic--retired  Social Needs  . Financial resource strain: Not on file  . Food insecurity:    Worry: Not on file    Inability: Not on file  . Transportation needs:    Medical: Not on file    Non-medical: Not on file  Tobacco Use  . Smoking status: Former Smoker    Packs/day: 2.00    Years: 35.00    Pack years: 70.00    Types: Cigars, Cigarettes    Last attempt to quit: 01/07/2001    Years since quitting: 17.1  . Smokeless tobacco: Never Used  Substance and Sexual Activity  . Alcohol use: Not Currently    Comment: rare  . Drug use: No  . Sexual activity: Yes    Partners: Female  Lifestyle  . Physical activity:    Days per week: Not on file    Minutes per session: Not on file  . Stress: Not on file  Relationships  . Social connections:    Talks on phone: Not on file    Gets together: Not on file    Attends religious service: Not on file    Active member of club or organization: Not on file    Attends meetings of clubs or organizations: Not on file    Relationship status: Not on file  . Intimate partner violence:    Fear of current or ex partner: Not on file    Emotionally abused: Not on file    Physically abused: Not on file    Forced sexual activity: Not on file  Other Topics Concern  . Not on  file  Social History Narrative   Has living will   Daughter [name] is [facility] health care POA    Would accept resuscitation attempts--but no prolonged artificial ventilation   No tube feeds if cognitively unaware   Review of Systems Appetite is fine Weight is down some since amputation Sleeps fairly well--awakens at 2AM and feels rested (but goes back) Wears seat belt Lost a front tooth--keeps with dentist Has skin lesion on right forearm----discussed it looks like cancer (will set up with dermatologist) No trouble with bowels. No blood No heartburn or dysphagia     Objective:   Physical Exam  Constitutional: He is oriented to person, place, and time. He appears well-developed. No distress.  HENT:  Mouth/Throat: Oropharynx is clear and moist. No oropharyngeal exudate.  Neck: No thyromegaly present.  Cardiovascular: Normal rate, regular rhythm and normal heart sounds. Exam reveals no gallop.  No murmur heard. No palpable pulse in foot  Respiratory: Effort normal and breath sounds normal. No respiratory distress. He has no wheezes. He has no rales.  GI: Soft. There is no abdominal tenderness.  Musculoskeletal:        General: No edema.  Lymphadenopathy:    He has no cervical adenopathy.  Neurological: He is alert and oriented to person, place, and time.  President--- "Daisy Floro, Obama, Bush" [phone] [id number] Recall 3/3  Decreased sensation in feet  Skin:  Left AKA stump clean and dry Ongoing swelling and redness along right 5th toe amputation site. Mild heat No ulcers Stasis changes on right  Psychiatric: He has a normal mood and affect. His behavior is normal.           Assessment & Plan:

## 2018-02-20 NOTE — Assessment & Plan Note (Signed)
I have personally reviewed the Medicare Annual Wellness questionnaire and have noted 1. The patient's medical and social history 2. Their use of alcohol, tobacco or illicit drugs 3. Their current medications and supplements 4. The patient's functional ability including ADL's, fall risks, home safety risks and hearing or visual             impairment. 5. Diet and physical activities 6. Evidence for depression or mood disorders  The patients weight, height, BMI and visual acuity have been recorded in the chart I have made referrals, counseling and provided education to the patient based review of the above and I have provided the pt with a written personalized care plan for preventive services.  I have provided you with a copy of your personalized plan for preventive services. Please take the time to review along with your updated medication list.  Will check FIT PSA screening by urology--no recurrence of cancer Yearly flu vaccine

## 2018-02-20 NOTE — Assessment & Plan Note (Signed)
Healing well.

## 2018-02-20 NOTE — Assessment & Plan Note (Signed)
Still with low albumin Drinking protein drinks now

## 2018-02-20 NOTE — Assessment & Plan Note (Signed)
Control has been good now

## 2018-02-20 NOTE — Assessment & Plan Note (Signed)
Last GFR some better Defer labs since done 2 months ago

## 2018-02-20 NOTE — Progress Notes (Signed)
Hearing Screening Comments: High Pitch Hearing Loss per pt. No hearing aids. Vision Screening Comments: October 2019

## 2018-02-20 NOTE — Assessment & Plan Note (Signed)
See social history 

## 2018-02-20 NOTE — Assessment & Plan Note (Signed)
BP Readings from Last 3 Encounters:  02/20/18 130/76  01/03/18 (!) 142/67  12/26/17 124/67   Good control

## 2018-02-20 NOTE — Assessment & Plan Note (Signed)
Further amputations were needed

## 2018-02-20 NOTE — Assessment & Plan Note (Signed)
Delayed healing Still on antibiotic

## 2018-02-26 ENCOUNTER — Encounter (INDEPENDENT_AMBULATORY_CARE_PROVIDER_SITE_OTHER): Payer: Self-pay | Admitting: Orthopedic Surgery

## 2018-02-26 ENCOUNTER — Ambulatory Visit (INDEPENDENT_AMBULATORY_CARE_PROVIDER_SITE_OTHER): Payer: Medicare Other | Admitting: Orthopedic Surgery

## 2018-02-26 DIAGNOSIS — L97511 Non-pressure chronic ulcer of other part of right foot limited to breakdown of skin: Secondary | ICD-10-CM

## 2018-02-26 DIAGNOSIS — S78112A Complete traumatic amputation at level between left hip and knee, initial encounter: Secondary | ICD-10-CM

## 2018-02-26 DIAGNOSIS — Z89421 Acquired absence of other right toe(s): Secondary | ICD-10-CM

## 2018-02-26 DIAGNOSIS — Z89612 Acquired absence of left leg above knee: Secondary | ICD-10-CM

## 2018-02-26 NOTE — Progress Notes (Signed)
Office Visit Note   Patient: [name]           Date of Birth: 1950/11/13           MRN: [id number] Visit Date: 02/26/2018              Requested by: [name], MD [address] [place], [place] [address] PCP: [name], MD  Chief Complaint  Patient presents with  . Right Foot - Follow-up, Wound Check  . Left Leg - Follow-up  . Right Leg - Wound Check      HPI: Patient is a 68 year old gentleman who is status post left above-knee amputation status post right fifth ray amputation.  Patient previously has had some slow healing of the fifth ray amputation he has been using Bactroban Prantal and a nitroglycerin patch and has completed 2 weeks of Bactrim DS.  Assessment & Plan: Visit Diagnoses:  1. Unilateral AKA, left ([place])   2. History of complete ray amputation of fifth toe of right foot ([place])   3. Ulcer of toe of right foot, limited to breakdown of skin ([place])     Plan: Patient will discontinue the antibiotic at this time he will get a size extra-large sock for the right lower extremity.  He will follow-up with [facility] for prosthetic fitting.  Follow-Up Instructions: Return in about 2 months (around 04/27/2018).   Ortho Exam  Patient is alert, oriented, no adenopathy, well-dressed, normal affect, normal respiratory effort. Examination patient has a well-healed fifth ray amputation of the right foot there is no redness no cellulitis no drainage no signs of infection.  Examination of his left AKA he has good healing good hair growth no redness no cellulitis no ulcer.  He states he recently is been provided a smaller stump shrinker.  He has very small superficial venous ulcers on the right leg with brawny skin color changes.  Patient was placed in a 3 extra-large stump shrinker his leg measures 40 cm in circumference and he is given a prescription to obtain a pair of extra-large compression socks 15 to 20 mm of compression.  Imaging: No results found. No  images are attached to the encounter.  Labs: Lab Results  Component Value Date   HGBA1C 6.4 (A) 12/12/2017   HGBA1C 7.3 (A) 08/05/2017   HGBA1C 6.3 03/06/2017   ESRSEDRATE 35 (H) 03/31/2013   CRP 5.3 (H) 03/31/2013   REPTSTATUS 12/25/2017 FINAL 12/20/2017   GRAMSTAIN  12/30/2012    FEW WBC PRESENT, PREDOMINANTLY PMN RARE SQUAMOUS EPITHELIAL CELLS PRESENT ABUNDANT GRAM NEGATIVE RODS MODERATE GRAM POSITIVE COCCI IN PAIRS IN CLUSTERS   CULT  12/20/2017    NO GROWTH 5 DAYS Performed at [facility], [place] [address] [place], [place] [address]    Laie OXYTOCA 12/15/2012   LABORGA PSEUDOMONAS AERUGINOSA 12/15/2012     Lab Results  Component Value Date   ALBUMIN 2.6 (L) 12/27/2017   ALBUMIN 2.7 (L) 12/26/2017   ALBUMIN 3.5 12/14/2017    There is no height or weight on file to calculate BMI.  Orders:  No orders of the defined types were placed in this encounter.  No orders of the defined types were placed in this encounter.    Procedures: No procedures performed  Clinical Data: No additional findings.  ROS:  All other systems negative, except as noted in the HPI. Review of Systems  Objective: Vital Signs: There were no vitals taken for this visit.  Specialty  Comments:  No specialty comments available.  PMFS History: Patient Active Problem List   Diagnosis Date Noted  . Hypoalbuminemia due to protein-calorie malnutrition ([place])   . Type 2 diabetes mellitus with peripheral neuropathy ([facility])   . Left above-knee amputee ([place]) 12/26/2017  . Unilateral AKA, left ([place])   . History of complete ray amputation of fifth toe of right foot ([place])   . Postoperative pain   . Essential hypertension   . PAD (peripheral artery disease) ([place]) 02/19/2017  . Chronic renal disease, stage III ([place]) 02/19/2017  . Ulcer of toe of right foot, limited to breakdown of skin ([place]) 10/11/2016  . Type 2 diabetes mellitus with diabetic polyneuropathy, with long-term  current use of insulin ([place]) 03/29/2015  . Advance directive discussed with patient 02/07/2014  . Normocytic anemia 04/01/2013  . Diabetic Charcot foot ([place]) 04/01/2013  . Routine general medical examination at a health care facility 12/25/2010  . PERSONAL HX COLONIC POLYPS 11/22/2009  . Venous (peripheral) insufficiency 06/29/2008  . Osteoarthritis, multiple sites 04/03/2007  . ERECTILE DYSFUNCTION, ORGANIC 06/13/2006  . Hyperlipemia 06/11/2006  . GLAUCOMA 06/11/2006  . Coronary atherosclerosis of native coronary artery 06/11/2006  . Obstructive sleep apnea 06/11/2006   Past Medical History:  Diagnosis Date  . Cellulitis and abscess of left lower extremity 12/20/2017  . Coronary atherosclerosis of unspecified type of vessel, native or graft   . Diabetes mellitus without complication ([place])   . Hemorrhage of rectum and anus   . History of kidney stones   . Impotence of organic origin   . Internal hemorrhoids without mention of complication   . Left below-knee amputee ([place]) 02/23/2016  . Malignant neoplasm of prostate ([place])   . Mononeuritis of unspecified site   . Osteoarthrosis, unspecified whether generalized or localized, unspecified site   . Other and unspecified hyperlipidemia   . Personal history of colonic polyps   . Personal history of diabetic foot ulcer    saw wound center, resolved 05/08/2010  . Personal history of gallstones   . Routine general medical examination at a health care facility   . Subacute osteomyelitis, right ankle and foot ([place])   . Type II or unspecified type diabetes mellitus with neurological manifestations, not stated as uncontrolled(250.60)   . Unspecified glaucoma(365.9)   . Unspecified sleep apnea    cpcp  . Unspecified venous (peripheral) insufficiency     Family History  Problem Relation Age of Onset  . Lung cancer Father   . Multiple sclerosis Mother   . Heart attack Other        paternal aunts and uncles  . Peripheral vascular disease  Maternal Grandfather        several amputations    Past Surgical History:  Procedure Laterality Date  . AMPUTATION Left 12/30/2012   Procedure: AMPUTATION RAY ;  Surgeon: Meredith Pel, MD;  [facility];  Service: Orthopedics;  Laterality: Left;  LEFT GREAT TOE RAY AMPUTATION  . AMPUTATION Left 02/23/2016   Procedure: Left Below Knee Amputation;  Surgeon: [name], MD;  Location: [facility];  Service: Orthopedics;  Laterality: Left;  . AMPUTATION Left 12/24/2017   Procedure: LEFT ABOVE KNEE AMPUTATION;  Surgeon: [name], MD;  Location: [facility];  Service: Orthopedics;  Laterality: Left;  . AMPUTATION Right 12/24/2017   Procedure: RIGHT 5TH RAY AMPUTATION;  Surgeon: [name], MD;  Location: [facility];  Service: Orthopedics;  Laterality: Right;  . APPENDECTOMY    . BASAL CELL  CARCINOMA EXCISION  2/16   left forearm  . CARDIAC CATHETERIZATION  1998   Negative  . CATARACT EXTRACTION Right 2017   then lid surgery  . CORONARY ARTERY BYPASS GRAFT  09/2005   Post op AFIB  . EYE SURGERY    . FOOT BONE EXCISION Left 11/03/2013   DR    . I&D EXTREMITY Left 11/03/2013   Procedure: Left Foot Partial Bone Excision Cuboid and Medial Cuneiform, Wound Closures;  Surgeon: [name], MD;  Location: [facility];  Service: Orthopedics;  Laterality: Left;  . INSERTION PROSTATE RADIATION SEED  2009   RT and seeds for prostate cancer  . KIDNEY STONE SURGERY  04/1993  . RCA stents  04/2003   EF 55%  . RETINAL DETACHMENT SURGERY  2002-2003  . thrombosed vein  1993   Right leg   Social History   Occupational History  . Occupation: Heavy equipment mechanic--retired  Tobacco Use  . Smoking status: Former Smoker    Packs/day: 2.00    Years: 35.00    Pack years: 70.00    Types: Cigars, Cigarettes    Last attempt to quit: 01/07/2001    Years since quitting: 17.1  . Smokeless tobacco: Never Used  Substance and Sexual Activity  . Alcohol use: Not Currently    Comment: rare  . Drug use:  No  . Sexual activity: Yes    Partners: Female

## 2018-03-13 ENCOUNTER — Other Ambulatory Visit (INDEPENDENT_AMBULATORY_CARE_PROVIDER_SITE_OTHER): Payer: Medicare Other

## 2018-03-13 DIAGNOSIS — Z1211 Encounter for screening for malignant neoplasm of colon: Secondary | ICD-10-CM

## 2018-03-13 LAB — FECAL OCCULT BLOOD, IMMUNOCHEMICAL: Fecal Occult Bld: NEGATIVE

## 2018-03-23 ENCOUNTER — Other Ambulatory Visit: Payer: Self-pay | Admitting: Internal Medicine

## 2018-04-14 ENCOUNTER — Ambulatory Visit: Payer: Medicare Other | Admitting: Internal Medicine

## 2018-04-17 ENCOUNTER — Other Ambulatory Visit: Payer: Self-pay | Admitting: Internal Medicine

## 2018-04-23 ENCOUNTER — Other Ambulatory Visit: Payer: Self-pay | Admitting: Internal Medicine

## 2018-04-27 ENCOUNTER — Ambulatory Visit (INDEPENDENT_AMBULATORY_CARE_PROVIDER_SITE_OTHER): Payer: Medicare Other | Admitting: Orthopedic Surgery

## 2018-05-29 ENCOUNTER — Other Ambulatory Visit: Payer: Self-pay | Admitting: Internal Medicine

## 2018-07-07 ENCOUNTER — Other Ambulatory Visit: Payer: Self-pay | Admitting: Internal Medicine

## 2018-07-30 ENCOUNTER — Encounter: Payer: Self-pay | Admitting: Internal Medicine

## 2018-07-30 ENCOUNTER — Other Ambulatory Visit: Payer: Self-pay

## 2018-07-30 ENCOUNTER — Ambulatory Visit (INDEPENDENT_AMBULATORY_CARE_PROVIDER_SITE_OTHER): Payer: Medicare Other | Admitting: Internal Medicine

## 2018-07-30 DIAGNOSIS — E782 Mixed hyperlipidemia: Secondary | ICD-10-CM

## 2018-07-30 DIAGNOSIS — Z794 Long term (current) use of insulin: Secondary | ICD-10-CM

## 2018-07-30 DIAGNOSIS — E669 Obesity, unspecified: Secondary | ICD-10-CM | POA: Diagnosis not present

## 2018-07-30 DIAGNOSIS — E1142 Type 2 diabetes mellitus with diabetic polyneuropathy: Secondary | ICD-10-CM

## 2018-07-30 MED ORDER — NOVOLOG FLEXPEN 100 UNIT/ML ~~LOC~~ SOPN: PEN_INJECTOR | SUBCUTANEOUS | 3 refills | Status: DC

## 2018-07-30 MED ORDER — TOUJEO MAX SOLOSTAR 300 UNIT/ML ~~LOC~~ SOPN: 55.0000 [IU] | PEN_INJECTOR | Freq: Every day | SUBCUTANEOUS | 3 refills | Status: DC

## 2018-07-30 NOTE — Progress Notes (Signed)
Patient ID: [name], male   DOB: May 25, 1950, 68 y.o.   MRN: [id number]  Patient location: Home My location: Office  Referring Provider: [name], MD  I connected with the patient on 07/30/18 at 10:50 AM EDT by telephone and verified that I am speaking with the correct person.   I discussed the limitations of evaluation and management by telephone and the availability of in person appointments. The patient expressed understanding and agreed to proceed.   Details of the encounter are shown below.  HPI: [name] is a 68 y.o.-year-old male, returning for f/u for DM2, dx 2008, insulin-dependent since dx, uncontrolled, with complications (peripheral neuropathy, CAD-status post CABG in 2008, PVD, Charcot foot, history of diabetic foot ulcer, history of retinal detachment in 2002-3). Last visit 7 mo ago.  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 Also Part D - [employer].   He had skin flap necrosis before our last visit.  She also had left lower leg ulcer and was on protein shakes to help with healing.  He also had 3 ulcers on his left stump.  Since then, he had  L AKA 12/2017, R 5th ray amputation 12/2017.  Latest HbA1c was slightly higher: Lab Results  Component Value Date   HGBA1C 6.4 (A) 12/12/2017   HGBA1C 7.3 (A) 08/05/2017   HGBA1C 6.3 03/06/2017   Pt is on a regimen of: - Metformin 1000 mg at bedtime - Toujeo 60 >> 55 units at bedtime - Humalog 25-30 >> Novolog 16-26 units before meals  Pt checks his sugars 1-3 times a day - 90 day average: 150 - am: 150-190 >> 87, 130-150 >> 110-140 - before lunch: 69, 104-154, 207 >> n/c >> 56 >> n/c - before dinner: 109-161 >> n/c >> 130-160 >> 120-150 - bedtime:  96-160 >> n/c >> 110-130 >> n/c Lowest: 56 x1 (lunchtime) ... >> 80s >> 100. Hypogly awareness at 100.  Highest sugar: 200 >> 180 (Tx-giving) >> 190.  Patient meals are: - Breakfast: bacon, eggs, tomato sandwich (sometimes no bread) >> coffee +  fruit - Lunch: PB crackers >> baked fish or chicken + veggies - Dinner: meat + 2 veggies  >> same as lunch - Snacks: 1 a day - 2x a week, icecream  >> fruit Uses Splenda in tea and coffee.  -+ CKD, last BUN/creatinine:  Lab Results  Component Value Date   BUN 24 (H) 01/02/2018   CREATININE 0.93 01/02/2018  On losartan. -+ HL; last set of lipids: Lab Results  Component Value Date   CHOL 107 02/19/2017   HDL 26.40 (L) 02/19/2017   LDLCALC 52 02/19/2017   LDLDIRECT 72.0 02/13/2015   TRIG 143.0 02/19/2017   CHOLHDL 4 02/19/2017  He is statin intolerant. - last eye exam was  01/2017: No DR.  Dr. Claudean [name].  He had a detached retina in 03/2015 >> had Laser sx (his 3rd).  He had right cataract surgery 06/2015. -+ no sensation in his feet.  He had amputation of his big toe in 12/30/2012 2/2 infection >> osteomyelitis. He continued to have problems with ulcers on his left foot, in the setting of Charcot joint. He had a L BKA (02/23/2016) b/c he had a long h/o ulcer on his Charcot foot >> developed osteomyelitis.  Now prosthesis is off due to his ulcers.  Since last visit, he also had  L AKA and R 5th ray amputation in 12/2017.   He also has a history of  prostate cancer , s/p 25 RxTx, then radioactive seeds. Also, HTN, OSA, OA.  ROS: Constitutional: no weight gain/+ weight loss, no fatigue, no subjective hyperthermia, no subjective hypothermia Eyes: no blurry vision, no xerophthalmia ENT: no sore throat, no nodules palpated in neck, no dysphagia, no odynophagia, no hoarseness Cardiovascular: no CP/no SOB/no palpitations/no leg swelling Respiratory: no cough/no SOB/no wheezing Gastrointestinal: no N/no V/no D/no C/no acid reflux Musculoskeletal: no muscle aches/no joint aches Skin: no rashes, no hair loss Neurological: no tremors/+ numbness/no tingling/no dizziness  I reviewed pt's medications, allergies, PMH, social hx, family hx, and changes were documented in the history of present  illness. Otherwise, unchanged from my initial visit note.   Past Medical History:  Diagnosis Date  . Cellulitis and abscess of left lower extremity 12/20/2017  . Coronary atherosclerosis of unspecified type of vessel, native or graft   . Diabetes mellitus without complication ([place])   . Hemorrhage of rectum and anus   . History of kidney stones   . Impotence of organic origin   . Internal hemorrhoids without mention of complication   . Left below-knee amputee ([place]) 02/23/2016  . Malignant neoplasm of prostate ([place])   . Mononeuritis of unspecified site   . Osteoarthrosis, unspecified whether generalized or localized, unspecified site   . Other and unspecified hyperlipidemia   . Personal history of colonic polyps   . Personal history of diabetic foot ulcer    saw wound center, resolved 05/08/2010  . Personal history of gallstones   . Routine general medical examination at a health care facility   . Subacute osteomyelitis, right ankle and foot ([place])   . Type II or unspecified type diabetes mellitus with neurological manifestations, not stated as uncontrolled(250.60)   . Unspecified glaucoma(365.9)   . Unspecified sleep apnea    cpcp  . Unspecified venous (peripheral) insufficiency    Past Surgical History:  Procedure Laterality Date  . AMPUTATION Left 12/30/2012   Procedure: AMPUTATION RAY ;  Surgeon: Martin Pel, MD;  [facility];  Service: Orthopedics;  Laterality: Left;  LEFT GREAT TOE RAY AMPUTATION  . AMPUTATION Left 02/23/2016   Procedure: Left Below Knee Amputation;  Surgeon: Martin Minion, MD;  Location: [facility];  Service: Orthopedics;  Laterality: Left;  . AMPUTATION Left 12/24/2017   Procedure: LEFT ABOVE KNEE AMPUTATION;  Surgeon: Martin Minion, MD;  Location: [facility];  Service: Orthopedics;  Laterality: Left;  . AMPUTATION Right 12/24/2017   Procedure: RIGHT 5TH RAY AMPUTATION;  Surgeon: Martin Minion, MD;  Location: [facility];  Service: Orthopedics;  Laterality:  Right;  . APPENDECTOMY    . BASAL CELL CARCINOMA EXCISION  2/16   left forearm  . CARDIAC CATHETERIZATION  1998   Negative  . CATARACT EXTRACTION Right 2017   then lid surgery  . CORONARY ARTERY BYPASS GRAFT  09/2005   Post op AFIB  . EYE SURGERY    . FOOT BONE EXCISION Left 11/03/2013   DR DUDA   . I&D EXTREMITY Left 11/03/2013   Procedure: Left Foot Partial Bone Excision Cuboid and Medial Cuneiform, Wound Closures;  Surgeon: Martin Minion, MD;  Location: [facility];  Service: Orthopedics;  Laterality: Left;  . INSERTION PROSTATE RADIATION SEED  2009   RT and seeds for prostate cancer  . KIDNEY STONE SURGERY  04/1993  . RCA stents  04/2003   EF 55%  . RETINAL DETACHMENT SURGERY  2002-2003  . thrombosed vein  1993   Right leg  Social History   Socioeconomic History  . Marital status: Widowed    Spouse name: Not on file  . Number of children: 3  . Years of education: Not on file  . Highest education level: Not on file  Occupational History  . Occupation: Heavy equipment mechanic--retired  Social Needs  . Financial resource strain: Not on file  . Food insecurity    Worry: Not on file    Inability: Not on file  . Transportation needs    Medical: Not on file    Non-medical: Not on file  Tobacco Use  . Smoking status: Former Smoker    Packs/day: 2.00    Years: 35.00    Pack years: 70.00    Types: Cigars, Cigarettes    Quit date: 01/07/2001    Years since quitting: 17.5  . Smokeless tobacco: Never Used  Substance and Sexual Activity  . Alcohol use: Not Currently    Comment: rare  . Drug use: No  . Sexual activity: Yes    Partners: Female  Lifestyle  . Physical activity    Days per week: Not on file    Minutes per session: Not on file  . Stress: Not on file  Relationships  . Social Herbalist on phone: Not on file    Gets together: Not on file    Attends religious service: Not on file    Active member of club or organization: Not on file    Attends  meetings of clubs or organizations: Not on file    Relationship status: Not on file  . Intimate partner violence    Fear of current or ex partner: Not on file    Emotionally abused: Not on file    Physically abused: Not on file    Forced sexual activity: Not on file  Other Topics Concern  . Not on file  Social History Narrative   Has living will   Daughter [name] is [facility] care POA    Would accept resuscitation attempts--but no prolonged artificial ventilation   No tube feeds if cognitively unaware   Current Outpatient Medications on File Prior to Visit  Medication Sig Dispense Refill  . ACCU-CHEK GUIDE test strip USE AS INSTRUCTED TO CHECK SUGAR 3 TIMES DAILY DX- E11.42 200 strip 8  . acetaminophen (TYLENOL) 325 MG tablet Take 1-2 tablets (325-650 mg total) by mouth every 6 (six) hours as needed for mild pain (pain score 1-3 or temp > 100.5).    Marland Kitchen aspirin 325 MG tablet Take 325 mg by mouth daily.      . carvedilol (COREG) 25 MG tablet TAKE 1 TABLET TWICE DAILY 180 tablet 3  . DROPLET PEN NEEDLES 31G X 5 MM MISC USE TO INJECT INSULIN FOUR TIMES DAILY AS INSTRUCTED (FOR DIABETES) 400 each 1  . insulin glargine (LANTUS) 100 unit/mL SOPN Inject 0.42 mLs (42 Units total) into the skin daily. 15 mL 11  . linaclotide (LINZESS) 145 MCG CAPS capsule Take 1 capsule (145 mcg total) by mouth daily before breakfast. 30 capsule 1  . losartan-hydrochlorothiazide (HYZAAR) 50-12.5 MG tablet TAKE 1 TABLET EVERY DAY 90 tablet 3  . metFORMIN (GLUCOPHAGE) 1000 MG tablet TAKE 1 TABLET EVERY DAY 90 tablet 2  . methocarbamol (ROBAXIN) 500 MG tablet Take 1 tablet (500 mg total) by mouth every 6 (six) hours as needed for muscle spasms. 60 tablet 0  . mupirocin ointment (BACTROBAN) 2 % Apply 1 application topically daily. 22 g 6  .  nitroGLYCERIN (NITRODUR - DOSED IN MG/24 HR) 0.4 mg/hr patch Place 1 patch (0.4 mg total) onto the skin daily. 30 patch 12  . NOVOLOG FLEXPEN 100 UNIT/ML FlexPen INJECT 35  UNITS SUBCUTANEOUSLY THREE TIMES DAILY WITH MEALS 15 mL 1  . oxyCODONE (OXY IR/ROXICODONE) 5 MG immediate release tablet Take 1-2 tablets (5-10 mg total) by mouth every 4 (four) hours as needed for moderate pain (pain score 4-6). 30 tablet 0  . pentoxifylline (TRENTAL) 400 MG CR tablet Take 1 tablet (400 mg total) by mouth 3 (three) times daily with meals. 90 tablet 11  . polyethylene glycol (MIRALAX / GLYCOLAX) packet Take 17 g by mouth daily as needed for mild constipation. 14 each 0  . rosuvastatin (CRESTOR) 5 MG tablet Take 1 tablet (5 mg total) by mouth once a week. 12 tablet 3  . sulfamethoxazole-trimethoprim (BACTRIM DS,SEPTRA DS) 800-160 MG tablet Take 1 tablet by mouth 2 (two) times daily. 60 tablet 0  . tamsulosin (FLOMAX) 0.4 MG CAPS capsule Take 1 capsule (0.4 mg total) by mouth daily. 30 capsule 0  . vitamin C (ASCORBIC ACID) 500 MG tablet Take 500 mg by mouth 2 (two) times daily.    Marland Kitchen zinc gluconate 50 MG tablet Take 50 mg by mouth daily.     No current facility-administered medications on file prior to visit.    Allergies  Allergen Reactions  . Atorvastatin Other (See Comments)    REACTION: myalgias  . Ezetimibe Other (See Comments)    Body ache  . Simvastatin Other (See Comments)    Body ache   Family History  Problem Relation Age of Onset  . Lung cancer Father   . Multiple sclerosis Mother   . Heart attack Other        paternal aunts and uncles  . Peripheral vascular disease Maternal Grandfather        several amputations    PE: There were no vitals taken for this visit. There is no height or weight on file to calculate BMI.   Wt Readings from Last 3 Encounters:  02/20/18 260 lb (117.9 kg)  02/11/18 273 lb (123.8 kg)  01/29/18 273 lb (123.8 kg)   Constitutional:  in NAD  The physical exam was not performed (telephone visit).  ASSESSMENT: 1. DM2, insulin-dependent, uncontrolled, with complications - peripheral neuropathy - CAD-status post stent in 2005,  then 5v CABG in 2008 - PVD - history of diabetic foot ulcer - Charcot foot - L - h/o amputation of his big toe in 12/2012 2/2 osteomyelitis and L BKA 01/2016; L AKA 12/2017, R 5th ray amputation 12/2017 - history of retinal detachment in 2002-3  2. Obesity class 2 BMI Classification:  < 18.5 underweight   18.5-24.9 normal weight   25.0-29.9 overweight   30.0-34.9 class I obesity   35.0-39.9 class II obesity   ? 40.0 class III obesity   3. HL  PLAN:  1. Patient with previously uncontrolled type 2 diabetes, on basal-bolus insulin regimen and metformin, with better control after he started to eat a more plant-based diet.  He did very well on this diet, with latest HbA1c being 6.4%.  However, he did relax the diet afterwards.  At our last visit (virtual), sugars were improved, but they were above target before meals.  We discussed that this may have been due to his protein shakes.  He was planning to start eating healthier meals again and we discussed about reducing the dose of NovoLog when he starts. -  at this visit, sugars are at goal or close to goal, without major fluctuations.  I do not feel that for now we need to change his regimen.  He was able to decrease the Toujeo and Humalog doses slightly since last visit, most likely after resolution of his infection.  He continues to eat a mostly plant-based diet. - I advised him to:  Patient Instructions  Please continue: - Metformin 1000 mg at bedtime - Toujeo 55 units at bedtime - Humalog 16-26 units before meals  Please return in 4 months with your sugar log.   - we will check his HbA1c when he returns to the clinic - advised to check sugars at different times of the day - 3x a day, rotating check times - advised for yearly eye exams >> he is not UTD - return to clinic in 3-4 months   2. Obesity class 2 -He initially lost 20 pounds on a more plant-based diet. -Afterwards, he gained 9 pounds back and weight was stable at last  check -since last OV ~stable  3. HL - Reviewed latest lipid panel from 02/2017: LDL at goal, HDL low Lab Results  Component Value Date   CHOL 107 02/19/2017   HDL 26.40 (L) 02/19/2017   LDLCALC 52 02/19/2017   LDLDIRECT 72.0 02/13/2015   TRIG 143.0 02/19/2017   CHOLHDL 4 02/19/2017  -He is intolerant to statins. -He is due for another lipid panel - when he comes back  - time spent with the patient: 13 min, of which >50% was spent in obtaining information about his symptoms, reviewing his previous labs, evaluations, and treatments, counseling him about his conditions (please see the discussed topics above), and developing a plan to further investigate and treat them; he had a number of questions which I addressed.   Philemon Kingdom, MD PhD [facility] Endocrinology

## 2018-07-30 NOTE — Patient Instructions (Addendum)
Please continue: - Metformin 1000 mg at bedtime - Toujeo 55 units at bedtime - Humalog 16-26 units before meals  Please return in 4 months with your sugar log.

## 2018-08-11 ENCOUNTER — Ambulatory Visit: Payer: Self-pay | Admitting: Orthopedic Surgery

## 2018-08-17 ENCOUNTER — Other Ambulatory Visit (INDEPENDENT_AMBULATORY_CARE_PROVIDER_SITE_OTHER): Payer: Self-pay | Admitting: Family

## 2018-08-21 ENCOUNTER — Other Ambulatory Visit: Payer: Self-pay | Admitting: Internal Medicine

## 2018-08-21 ENCOUNTER — Other Ambulatory Visit: Payer: Self-pay

## 2018-08-21 MED ORDER — NOVOLOG FLEXPEN 100 UNIT/ML ~~LOC~~ SOPN: PEN_INJECTOR | SUBCUTANEOUS | 3 refills | Status: DC

## 2018-09-10 ENCOUNTER — Other Ambulatory Visit: Payer: Self-pay

## 2018-09-10 ENCOUNTER — Encounter: Payer: Self-pay | Admitting: Orthopedic Surgery

## 2018-09-10 ENCOUNTER — Ambulatory Visit (INDEPENDENT_AMBULATORY_CARE_PROVIDER_SITE_OTHER): Payer: Medicare Other | Admitting: Orthopedic Surgery

## 2018-09-10 VITALS — Ht 74.0 in | Wt 260.0 lb

## 2018-09-10 DIAGNOSIS — Z89421 Acquired absence of other right toe(s): Secondary | ICD-10-CM | POA: Diagnosis not present

## 2018-09-10 DIAGNOSIS — I87331 Chronic venous hypertension (idiopathic) with ulcer and inflammation of right lower extremity: Secondary | ICD-10-CM | POA: Diagnosis not present

## 2018-09-10 DIAGNOSIS — L97919 Non-pressure chronic ulcer of unspecified part of right lower leg with unspecified severity: Secondary | ICD-10-CM

## 2018-09-14 ENCOUNTER — Encounter: Payer: Self-pay | Admitting: Orthopedic Surgery

## 2018-09-14 NOTE — Progress Notes (Signed)
Office Visit Note   Patient: [name]           Date of Birth: [birth year]           MRN: [id number] Visit Date: 09/10/2018              Requested by: [name], MD [address] [place],  [place] [address] PCP: [name], MD  Chief Complaint  Patient presents with  . Right Foot - Follow-up    12/24/17 right 5th ray amputation       HPI: Patient is a 68 year old gentleman who presents for evaluation for chronic venous insufficiency with ulceration approximately 9 months status post right foot fifth ray amputation.  Patient states he has 2 venous ulcers on the left lower extremity complains of drainage redness and swelling.  Assessment & Plan: Visit Diagnoses:  1. History of complete ray amputation of fifth toe of right foot ([place])   2. Idiopathic chronic venous hypertension of right lower extremity with ulcer and inflammation (HCC)     Plan: Recommended patient continue wearing his compression stockings for the venous insufficiency ulcer.  The ischemic right great toe ulcer has shown improvement with using the nitroglycerin patch and he will continue using the patch.  Follow-Up Instructions: Return in about 4 weeks (around 10/08/2018).   Ortho Exam  Patient is alert, oriented, no adenopathy, well-dressed, normal affect, normal respiratory effort. Examination patient's right foot fifth ray amputation site is healed well he has a small ischemic ulcer over the tip of the great toe on the right which has improved with using the nitroglycerin patch.  He has 2 venous stasis ulcers on the right lower extremity no exposed bone or tendon no cellulitis no signs of infection.  Imaging: No results found. No images are attached to the encounter.  Labs: Lab Results  Component Value Date   HGBA1C 6.4 (A) 12/12/2017   HGBA1C 7.3 (A) 08/05/2017   HGBA1C 6.3 03/06/2017   ESRSEDRATE 35 (H) 03/31/2013   CRP 5.3 (H) 03/31/2013   REPTSTATUS 12/25/2017 FINAL  12/20/2017   GRAMSTAIN  12/30/2012    FEW WBC PRESENT, PREDOMINANTLY PMN RARE SQUAMOUS EPITHELIAL CELLS PRESENT ABUNDANT GRAM NEGATIVE RODS MODERATE GRAM POSITIVE COCCI IN PAIRS IN CLUSTERS   CULT  12/20/2017    NO GROWTH 5 DAYS Performed at [facility], [place] [address], [place], [place] [address]    LABORGA KLEBSIELLA OXYTOCA 12/15/2012   LABORGA PSEUDOMONAS AERUGINOSA 12/15/2012     Lab Results  Component Value Date   ALBUMIN 2.6 (L) 12/27/2017   ALBUMIN 2.7 (L) 12/26/2017   ALBUMIN 3.5 12/14/2017    No results found for: MG No results found for: VD25OH  No results found for: PREALBUMIN CBC EXTENDED Latest Ref Rng & Units 01/02/2018 12/27/2017 12/26/2017  WBC 4.0 - 10.5 K/uL 10.1 10.4 12.7(H)  RBC 4.22 - 5.81 MIL/uL 3.70(L) 3.59(L) 3.70(L)  HGB 13.0 - 17.0 g/dL 10.3(L) 9.9(L) 10.0(L)  HCT 39.0 - 52.0 % 31.6(L) 30.5(L) 31.2(L)  PLT 150 - 400 K/uL 227 252 262  NEUTROABS 1.7 - 7.7 K/uL 7.7 7.9(H) 10.1(H)  LYMPHSABS 0.7 - 4.0 K/uL 1.1 1.2 1.3     Body mass index is 33.38 kg/m.  Orders:  No orders of the defined types were placed in this encounter.  No orders of the defined types were placed in this encounter.    Procedures: No procedures performed  Clinical Data: No additional findings.  ROS:  All  other systems negative, except as noted in the HPI. Review of Systems  Objective: Vital Signs: Ht 6\' 2"  (1.88 m)   Wt 260 lb (117.9 kg)   BMI 33.38 kg/m   Specialty Comments:  No specialty comments available.  PMFS History: Patient Active Problem List   Diagnosis Date Noted  . Hypoalbuminemia due to protein-calorie malnutrition ([place])   . Type 2 diabetes mellitus with peripheral neuropathy (HCC)   . Left above-knee amputee ([place]) 12/26/2017  . Unilateral AKA, left ([place])   . History of complete ray amputation of fifth toe of right foot ([place])   . Postoperative pain   . Essential hypertension   . PAD (peripheral artery disease) ([place]) 02/19/2017  .  Chronic renal disease, stage III ([place]) 02/19/2017  . Ulcer of toe of right foot, limited to breakdown of skin ([place]) 10/11/2016  . Type 2 diabetes mellitus with diabetic polyneuropathy, with long-term current use of insulin ([place]) 03/29/2015  . Advance directive discussed with patient 02/07/2014  . Normocytic anemia 04/01/2013  . Diabetic Charcot foot ([place]) 04/01/2013  . Routine general medical examination at a health care facility 12/25/2010  . PERSONAL HX COLONIC POLYPS 11/22/2009  . Venous (peripheral) insufficiency 06/29/2008  . Osteoarthritis, multiple sites 04/03/2007  . ERECTILE DYSFUNCTION, ORGANIC 06/13/2006  . Hyperlipemia 06/11/2006  . GLAUCOMA 06/11/2006  . Coronary atherosclerosis of native coronary artery 06/11/2006  . Obstructive sleep apnea 06/11/2006   Past Medical History:  Diagnosis Date  . Cellulitis and abscess of left lower extremity 12/20/2017  . Coronary atherosclerosis of unspecified type of vessel, native or graft   . Diabetes mellitus without complication ([place])   . Hemorrhage of rectum and anus   . History of kidney stones   . Impotence of organic origin   . Internal hemorrhoids without mention of complication   . Left below-knee amputee ([place]) 02/23/2016  . Malignant neoplasm of prostate ([place])   . Mononeuritis of unspecified site   . Osteoarthrosis, unspecified whether generalized or localized, unspecified site   . Other and unspecified hyperlipidemia   . Personal history of colonic polyps   . Personal history of diabetic foot ulcer    saw wound center, resolved 05/08/2010  . Personal history of gallstones   . Routine general medical examination at a health care facility   . Subacute osteomyelitis, right ankle and foot ([place])   . Type II or unspecified type diabetes mellitus with neurological manifestations, not stated as uncontrolled(250.60)   . Unspecified glaucoma(365.9)   . Unspecified sleep apnea    cpcp  . Unspecified venous (peripheral)  insufficiency     Family History  Problem Relation Age of Onset  . Lung cancer Father   . Multiple sclerosis Mother   . Heart attack Other        paternal aunts and uncles  . Peripheral vascular disease Maternal Grandfather        several amputations    Past Surgical History:  Procedure Laterality Date  . AMPUTATION Left 12/30/2012   Procedure: AMPUTATION RAY ;  Surgeon: Meredith Pel, MD;  [facility];  Service: Orthopedics;  Laterality: Left;  LEFT GREAT TOE RAY AMPUTATION  . AMPUTATION Left 02/23/2016   Procedure: Left Below Knee Amputation;  Surgeon: [name], MD;  Location: [facility];  Service: Orthopedics;  Laterality: Left;  . AMPUTATION Left 12/24/2017   Procedure: LEFT ABOVE KNEE AMPUTATION;  Surgeon: [name], MD;  Location: [facility];  Service: Orthopedics;  Laterality: Left;  .  AMPUTATION Right 12/24/2017   Procedure: RIGHT 5TH RAY AMPUTATION;  Surgeon: [name], MD;  Location: [facility];  Service: Orthopedics;  Laterality: Right;  . APPENDECTOMY    . BASAL CELL CARCINOMA EXCISION  2/16   left forearm  . CARDIAC CATHETERIZATION  1998   Negative  . CATARACT EXTRACTION Right 2017   then lid surgery  . CORONARY ARTERY BYPASS GRAFT  09/2005   Post op AFIB  . EYE SURGERY    . FOOT BONE EXCISION Left 11/03/2013   DR DUDA   . I&D EXTREMITY Left 11/03/2013   Procedure: Left Foot Partial Bone Excision Cuboid and Medial Cuneiform, Wound Closures;  Surgeon: [name], MD;  Location: [facility];  Service: Orthopedics;  Laterality: Left;  . INSERTION PROSTATE RADIATION SEED  2009   RT and seeds for prostate cancer  . KIDNEY STONE SURGERY  04/1993  . RCA stents  04/2003   EF 55%  . RETINAL DETACHMENT SURGERY  2002-2003  . thrombosed vein  1993   Right leg   Social History   Occupational History  . Occupation: Heavy equipment mechanic--retired  Tobacco Use  . Smoking status: Former Smoker    Packs/day: 2.00    Years: 35.00    Pack years: 70.00    Types:  Cigars, Cigarettes    Quit date: 01/07/2001    Years since quitting: 17.6  . Smokeless tobacco: Never Used  Substance and Sexual Activity  . Alcohol use: Not Currently    Comment: rare  . Drug use: No  . Sexual activity: Yes    Partners: Female

## 2018-10-08 ENCOUNTER — Encounter: Payer: Self-pay | Admitting: Orthopedic Surgery

## 2018-10-08 ENCOUNTER — Ambulatory Visit (INDEPENDENT_AMBULATORY_CARE_PROVIDER_SITE_OTHER): Payer: Medicare Other | Admitting: Orthopedic Surgery

## 2018-10-08 VITALS — Ht 74.0 in | Wt 260.0 lb

## 2018-10-08 DIAGNOSIS — L97511 Non-pressure chronic ulcer of other part of right foot limited to breakdown of skin: Secondary | ICD-10-CM

## 2018-10-08 DIAGNOSIS — I87331 Chronic venous hypertension (idiopathic) with ulcer and inflammation of right lower extremity: Secondary | ICD-10-CM | POA: Diagnosis not present

## 2018-10-08 DIAGNOSIS — Z89421 Acquired absence of other right toe(s): Secondary | ICD-10-CM

## 2018-10-08 DIAGNOSIS — L97919 Non-pressure chronic ulcer of unspecified part of right lower leg with unspecified severity: Secondary | ICD-10-CM | POA: Diagnosis not present

## 2018-10-14 ENCOUNTER — Encounter: Payer: Self-pay | Admitting: Orthopedic Surgery

## 2018-10-14 NOTE — Progress Notes (Signed)
Office Visit Note   Patient: [name]           Date of Birth: Jul 06, 1950           MRN: [id number] Visit Date: 10/08/2018              Requested by: [name], MD [address] [place],  [place] [address] PCP: [name], MD  Chief Complaint  Patient presents with  . Right Foot - Follow-up    12/24/17 right 5th ray amputation       HPI: Patient is a 68 year old gentleman who is 10 months status post right foot fifth ray amputation he is using a nitroglycerin patch he has several open areas on the skin he states he did have a blister that ruptured and then had blunt trauma to his foot.  He is currently wearing a knee-high compression stocking.  Assessment & Plan: Visit Diagnoses:  1. History of complete ray amputation of fifth toe of right foot ([place])     Plan: Recommended continue with the compression stockings continue with elevation work his ankle to work the calf pump.  Follow-Up Instructions: Return in about 4 weeks (around 11/05/2018).   Ortho Exam  Patient is alert, oriented, no adenopathy, well-dressed, normal affect, normal respiratory effort. Examination patient has traumatic venous stasis ulcers to the right leg.  These are superficial healthy granulation tissue no exposed bone or tendon no cellulitis no drainage the largest ulcer is 2 x 4 cm.  The ischemic ulcer on the right great toe is stable this is about 5 mm in diameter with no breakdown no drainage no cellulitis.  The fifth ray amputation is healed well.  His calf is 42 cm in circumference.  Imaging: No results found. No images are attached to the encounter.  Labs: Lab Results  Component Value Date   HGBA1C 6.4 (A) 12/12/2017   HGBA1C 7.3 (A) 08/05/2017   HGBA1C 6.3 03/06/2017   ESRSEDRATE 35 (H) 03/31/2013   CRP 5.3 (H) 03/31/2013   REPTSTATUS 12/25/2017 FINAL 12/20/2017   GRAMSTAIN  12/30/2012    FEW WBC PRESENT, PREDOMINANTLY PMN RARE SQUAMOUS EPITHELIAL CELLS PRESENT  ABUNDANT GRAM NEGATIVE RODS MODERATE GRAM POSITIVE COCCI IN PAIRS IN CLUSTERS   CULT  12/20/2017    NO GROWTH 5 DAYS Performed at [facility], [place] [address] [place], [place] [address]    LABORGA KLEBSIELLA OXYTOCA 12/15/2012   LABORGA PSEUDOMONAS AERUGINOSA 12/15/2012     Lab Results  Component Value Date   ALBUMIN 2.6 (L) 12/27/2017   ALBUMIN 2.7 (L) 12/26/2017   ALBUMIN 3.5 12/14/2017    No results found for: MG No results found for: VD25OH  No results found for: PREALBUMIN CBC EXTENDED Latest Ref Rng & Units 01/02/2018 12/27/2017 12/26/2017  WBC 4.0 - 10.5 K/uL 10.1 10.4 12.7(H)  RBC 4.22 - 5.81 MIL/uL 3.70(L) 3.59(L) 3.70(L)  HGB 13.0 - 17.0 g/dL 10.3(L) 9.9(L) 10.0(L)  HCT 39.0 - 52.0 % 31.6(L) 30.5(L) 31.2(L)  PLT 150 - 400 K/uL 227 252 262  NEUTROABS 1.7 - 7.7 K/uL 7.7 7.9(H) 10.1(H)  LYMPHSABS 0.7 - 4.0 K/uL 1.1 1.2 1.3     Body mass index is 33.38 kg/m.  Orders:  No orders of the defined types were placed in this encounter.  No orders of the defined types were placed in this encounter.    Procedures: No procedures performed  Clinical Data: No additional findings.  ROS:  All other systems negative,  except as noted in the HPI. Review of Systems  Objective: Vital Signs: Ht 6\' 2"  (1.88 m)   Wt 260 lb (117.9 kg)   BMI 33.38 kg/m   Specialty Comments:  No specialty comments available.  PMFS History: Patient Active Problem List   Diagnosis Date Noted  . Hypoalbuminemia due to protein-calorie malnutrition ([place])   . Type 2 diabetes mellitus with peripheral neuropathy (HCC)   . Left above-knee amputee ([place]) 12/26/2017  . Unilateral AKA, left ([place])   . History of complete ray amputation of fifth toe of right foot ([place])   . Postoperative pain   . Essential hypertension   . PAD (peripheral artery disease) ([place]) 02/19/2017  . Chronic renal disease, stage III 02/19/2017  . Ulcer of toe of right foot, limited to breakdown of skin ([place])  10/11/2016  . Type 2 diabetes mellitus with diabetic polyneuropathy, with long-term current use of insulin ([place]) 03/29/2015  . Advance directive discussed with patient 02/07/2014  . Normocytic anemia 04/01/2013  . Diabetic Charcot foot ([place]) 04/01/2013  . Routine general medical examination at a health care facility 12/25/2010  . PERSONAL HX COLONIC POLYPS 11/22/2009  . Venous (peripheral) insufficiency 06/29/2008  . Osteoarthritis, multiple sites 04/03/2007  . ERECTILE DYSFUNCTION, ORGANIC 06/13/2006  . Hyperlipemia 06/11/2006  . GLAUCOMA 06/11/2006  . Coronary atherosclerosis of native coronary artery 06/11/2006  . Obstructive sleep apnea 06/11/2006   Past Medical History:  Diagnosis Date  . Cellulitis and abscess of left lower extremity 12/20/2017  . Coronary atherosclerosis of unspecified type of vessel, native or graft   . Diabetes mellitus without complication ([place])   . Hemorrhage of rectum and anus   . History of kidney stones   . Impotence of organic origin   . Internal hemorrhoids without mention of complication   . Left below-knee amputee ([place]) 02/23/2016  . Malignant neoplasm of prostate ([place])   . Mononeuritis of unspecified site   . Osteoarthrosis, unspecified whether generalized or localized, unspecified site   . Other and unspecified hyperlipidemia   . Personal history of colonic polyps   . Personal history of diabetic foot ulcer    saw wound center, resolved 05/08/2010  . Personal history of gallstones   . Routine general medical examination at a health care facility   . Subacute osteomyelitis, right ankle and foot ([place])   . Type II or unspecified type diabetes mellitus with neurological manifestations, not stated as uncontrolled(250.60)   . Unspecified glaucoma(365.9)   . Unspecified sleep apnea    cpcp  . Unspecified venous (peripheral) insufficiency     Family History  Problem Relation Age of Onset  . Lung cancer Father   . Multiple sclerosis Mother   .  Heart attack Other        paternal aunts and uncles  . Peripheral vascular disease Maternal Grandfather        several amputations    Past Surgical History:  Procedure Laterality Date  . AMPUTATION Left 12/30/2012   Procedure: AMPUTATION RAY ;  Surgeon: Meredith Pel, MD;  [facility];  Service: Orthopedics;  Laterality: Left;  LEFT GREAT TOE RAY AMPUTATION  . AMPUTATION Left 02/23/2016   Procedure: Left Below Knee Amputation;  Surgeon: [name], MD;  Location: [facility];  Service: Orthopedics;  Laterality: Left;  . AMPUTATION Left 12/24/2017   Procedure: LEFT ABOVE KNEE AMPUTATION;  Surgeon: [name], MD;  Location: [facility];  Service: Orthopedics;  Laterality: Left;  . AMPUTATION Right 12/24/2017  Procedure: RIGHT 5TH RAY AMPUTATION;  Surgeon: [name], MD;  Location: [facility];  Service: Orthopedics;  Laterality: Right;  . APPENDECTOMY    . BASAL CELL CARCINOMA EXCISION  2/16   left forearm  . CARDIAC CATHETERIZATION  1998   Negative  . CATARACT EXTRACTION Right 2017   then lid surgery  . CORONARY ARTERY BYPASS GRAFT  09/2005   Post op AFIB  . EYE SURGERY    . FOOT BONE EXCISION Left 11/03/2013   DR DUDA   . I&D EXTREMITY Left 11/03/2013   Procedure: Left Foot Partial Bone Excision Cuboid and Medial Cuneiform, Wound Closures;  Surgeon: [name], MD;  Location: [facility];  Service: Orthopedics;  Laterality: Left;  . INSERTION PROSTATE RADIATION SEED  2009   RT and seeds for prostate cancer  . KIDNEY STONE SURGERY  04/1993  . RCA stents  04/2003   EF 55%  . RETINAL DETACHMENT SURGERY  2002-2003  . thrombosed vein  1993   Right leg   Social History   Occupational History  . Occupation: Heavy equipment mechanic--retired  Tobacco Use  . Smoking status: Former Smoker    Packs/day: 2.00    Years: 35.00    Pack years: 70.00    Types: Cigars, Cigarettes    Quit date: 01/07/2001    Years since quitting: 17.7  . Smokeless tobacco: Never Used  Substance and  Sexual Activity  . Alcohol use: Not Currently    Comment: rare  . Drug use: No  . Sexual activity: Yes    Partners: Female

## 2018-10-22 ENCOUNTER — Other Ambulatory Visit: Payer: Self-pay | Admitting: Internal Medicine

## 2018-10-29 ENCOUNTER — Encounter: Payer: Self-pay | Admitting: Internal Medicine

## 2018-11-05 ENCOUNTER — Encounter: Payer: Self-pay | Admitting: Orthopedic Surgery

## 2018-11-05 ENCOUNTER — Ambulatory Visit: Payer: Medicare Other | Admitting: Orthopedic Surgery

## 2018-11-05 ENCOUNTER — Ambulatory Visit (INDEPENDENT_AMBULATORY_CARE_PROVIDER_SITE_OTHER): Payer: Medicare Other | Admitting: Orthopedic Surgery

## 2018-11-05 ENCOUNTER — Other Ambulatory Visit: Payer: Self-pay

## 2018-11-05 VITALS — Ht 74.0 in | Wt 260.0 lb

## 2018-11-05 DIAGNOSIS — L97919 Non-pressure chronic ulcer of unspecified part of right lower leg with unspecified severity: Secondary | ICD-10-CM

## 2018-11-05 DIAGNOSIS — I87331 Chronic venous hypertension (idiopathic) with ulcer and inflammation of right lower extremity: Secondary | ICD-10-CM

## 2018-11-05 DIAGNOSIS — Z89421 Acquired absence of other right toe(s): Secondary | ICD-10-CM

## 2018-11-06 ENCOUNTER — Encounter: Payer: Self-pay | Admitting: Orthopedic Surgery

## 2018-11-06 NOTE — Progress Notes (Signed)
Office Visit Note   Patient: [name]           Date of Birth: 04/11/1950           MRN: [id number] Visit Date: 11/05/2018              Requested by: [name], MD [address] [place],  [place] [address] PCP: [name], MD  Chief Complaint  Patient presents with  . Left Leg - Follow-up  . Right Foot - Follow-up      HPI: Patient is a 68 year old gentleman left transtibial amputation right fifth ray amputation with increased swelling and venous ulcers on the right leg.  Patient states he has been wearing his compression stocking but he has had drainage.  Assessment & Plan: Visit Diagnoses:  1. History of complete ray amputation of fifth toe of right foot ([place])   2. Idiopathic chronic venous hypertension of right lower extremity with ulcer and inflammation ([place])     Plan: With patient's increased swelling and dependence of his legs it feels important that we place him in a Dynaflex compression wrap today follow-up on Monday to repeat the wrap once we get the swelling under control we can place him back in his compression stockings recommended stopping his nitroglycerin patch the right great toe ulcer has healed nicely.  Follow-Up Instructions: Return in about 1 week (around 11/12/2018).   Ortho Exam  Patient is alert, oriented, no adenopathy, well-dressed, normal affect, normal respiratory effort. Examination the ischemic ulcer of the right great toe is completely healed.  His right calf measures 45 cm in circumference and is okay swelling with a new venous ulcer 7 x 8 cm 1 mm deep.  The right fifth ray is healed well.  Imaging: No results found. No images are attached to the encounter.  Labs: Lab Results  Component Value Date   HGBA1C 6.4 (A) 12/12/2017   HGBA1C 7.3 (A) 08/05/2017   HGBA1C 6.3 03/06/2017   ESRSEDRATE 35 (H) 03/31/2013   CRP 5.3 (H) 03/31/2013   REPTSTATUS 12/25/2017 FINAL 12/20/2017   GRAMSTAIN  12/30/2012    FEW WBC  PRESENT, PREDOMINANTLY PMN RARE SQUAMOUS EPITHELIAL CELLS PRESENT ABUNDANT GRAM NEGATIVE RODS MODERATE GRAM POSITIVE COCCI IN PAIRS IN CLUSTERS   CULT  12/20/2017    NO GROWTH 5 DAYS Performed at [facility], [place] [address] [place], [place] [address]    LABORGA KLEBSIELLA OXYTOCA 12/15/2012   LABORGA PSEUDOMONAS AERUGINOSA 12/15/2012     Lab Results  Component Value Date   ALBUMIN 2.6 (L) 12/27/2017   ALBUMIN 2.7 (L) 12/26/2017   ALBUMIN 3.5 12/14/2017    No results found for: MG No results found for: VD25OH  No results found for: PREALBUMIN CBC EXTENDED Latest Ref Rng & Units 01/02/2018 12/27/2017 12/26/2017  WBC 4.0 - 10.5 K/uL 10.1 10.4 12.7(H)  RBC 4.22 - 5.81 MIL/uL 3.70(L) 3.59(L) 3.70(L)  HGB 13.0 - 17.0 g/dL 10.3(L) 9.9(L) 10.0(L)  HCT 39.0 - 52.0 % 31.6(L) 30.5(L) 31.2(L)  PLT 150 - 400 K/uL 227 252 262  NEUTROABS 1.7 - 7.7 K/uL 7.7 7.9(H) 10.1(H)  LYMPHSABS 0.7 - 4.0 K/uL 1.1 1.2 1.3     Body mass index is 33.38 kg/m.  Orders:  No orders of the defined types were placed in this encounter.  No orders of the defined types were placed in this encounter.    Procedures: No procedures performed  Clinical Data: No additional findings.  ROS:  All other systems negative, except as noted in the HPI. Review of Systems  Objective: Vital Signs: Ht 6\' 2"  (1.88 m)   Wt 260 lb (117.9 kg)   BMI 33.38 kg/m   Specialty Comments:  No specialty comments available.  PMFS History: Patient Active Problem List   Diagnosis Date Noted  . Hypoalbuminemia due to protein-calorie malnutrition ([place])   . Type 2 diabetes mellitus with peripheral neuropathy (HCC)   . Left above-knee amputee ([place]) 12/26/2017  . Unilateral AKA, left ([place])   . History of complete ray amputation of fifth toe of right foot ([place])   . Postoperative pain   . Essential hypertension   . PAD (peripheral artery disease) ([place]) 02/19/2017  . Chronic renal disease, stage III 02/19/2017  .  Ulcer of toe of right foot, limited to breakdown of skin ([place]) 10/11/2016  . Type 2 diabetes mellitus with diabetic polyneuropathy, with long-term current use of insulin ([place]) 03/29/2015  . Advance directive discussed with patient 02/07/2014  . Normocytic anemia 04/01/2013  . Diabetic Charcot foot ([place]) 04/01/2013  . Routine general medical examination at a health care facility 12/25/2010  . PERSONAL HX COLONIC POLYPS 11/22/2009  . Venous (peripheral) insufficiency 06/29/2008  . Osteoarthritis, multiple sites 04/03/2007  . ERECTILE DYSFUNCTION, ORGANIC 06/13/2006  . Hyperlipemia 06/11/2006  . GLAUCOMA 06/11/2006  . Coronary atherosclerosis of native coronary artery 06/11/2006  . Obstructive sleep apnea 06/11/2006   Past Medical History:  Diagnosis Date  . Cellulitis and abscess of left lower extremity 12/20/2017  . Coronary atherosclerosis of unspecified type of vessel, native or graft   . Diabetes mellitus without complication ([place])   . Hemorrhage of rectum and anus   . History of kidney stones   . Impotence of organic origin   . Internal hemorrhoids without mention of complication   . Left below-knee amputee ([place]) 02/23/2016  . Malignant neoplasm of prostate ([place])   . Mononeuritis of unspecified site   . Osteoarthrosis, unspecified whether generalized or localized, unspecified site   . Other and unspecified hyperlipidemia   . Personal history of colonic polyps   . Personal history of diabetic foot ulcer    saw wound center, resolved 05/08/2010  . Personal history of gallstones   . Routine general medical examination at a health care facility   . Subacute osteomyelitis, right ankle and foot ([place])   . Type II or unspecified type diabetes mellitus with neurological manifestations, not stated as uncontrolled(250.60)   . Unspecified glaucoma(365.9)   . Unspecified sleep apnea    cpcp  . Unspecified venous (peripheral) insufficiency     Family History  Problem Relation Age of Onset   . Lung cancer Father   . Multiple sclerosis Mother   . Heart attack Other        paternal aunts and uncles  . Peripheral vascular disease Maternal Grandfather        several amputations    Past Surgical History:  Procedure Laterality Date  . AMPUTATION Left 12/30/2012   Procedure: AMPUTATION RAY ;  Surgeon: Meredith Pel, MD;  [facility];  Service: Orthopedics;  Laterality: Left;  LEFT GREAT TOE RAY AMPUTATION  . AMPUTATION Left 02/23/2016   Procedure: Left Below Knee Amputation;  Surgeon: [name], MD;  Location: [facility];  Service: Orthopedics;  Laterality: Left;  . AMPUTATION Left 12/24/2017   Procedure: LEFT ABOVE KNEE AMPUTATION;  Surgeon: [name], MD;  Location: [facility];  Service: Orthopedics;  Laterality: Left;  .  AMPUTATION Right 12/24/2017   Procedure: RIGHT 5TH RAY AMPUTATION;  Surgeon: [name], MD;  Location: [facility];  Service: Orthopedics;  Laterality: Right;  . APPENDECTOMY    . BASAL CELL CARCINOMA EXCISION  2/16   left forearm  . CARDIAC CATHETERIZATION  1998   Negative  . CATARACT EXTRACTION Right 2017   then lid surgery  . CORONARY ARTERY BYPASS GRAFT  09/2005   Post op AFIB  . EYE SURGERY    . FOOT BONE EXCISION Left 11/03/2013   DR Karlee Staff   . I&D EXTREMITY Left 11/03/2013   Procedure: Left Foot Partial Bone Excision Cuboid and Medial Cuneiform, Wound Closures;  Surgeon: [name], MD;  Location: [facility];  Service: Orthopedics;  Laterality: Left;  . INSERTION PROSTATE RADIATION SEED  2009   RT and seeds for prostate cancer  . KIDNEY STONE SURGERY  04/1993  . RCA stents  04/2003   EF 55%  . RETINAL DETACHMENT SURGERY  2002-2003  . thrombosed vein  1993   Right leg   Social History   Occupational History  . Occupation: Heavy equipment mechanic--retired  Tobacco Use  . Smoking status: Former Smoker    Packs/day: 2.00    Years: 35.00    Pack years: 70.00    Types: Cigars, Cigarettes    Quit date: 01/07/2001    Years since quitting:  17.8  . Smokeless tobacco: Never Used  Substance and Sexual Activity  . Alcohol use: Not Currently    Comment: rare  . Drug use: No  . Sexual activity: Yes    Partners: Female

## 2018-11-09 ENCOUNTER — Other Ambulatory Visit: Payer: Self-pay

## 2018-11-09 ENCOUNTER — Encounter: Payer: Self-pay | Admitting: Orthopedic Surgery

## 2018-11-09 ENCOUNTER — Ambulatory Visit (INDEPENDENT_AMBULATORY_CARE_PROVIDER_SITE_OTHER): Payer: Medicare Other | Admitting: Orthopedic Surgery

## 2018-11-09 VITALS — Ht 74.0 in | Wt 260.0 lb

## 2018-11-09 DIAGNOSIS — I87331 Chronic venous hypertension (idiopathic) with ulcer and inflammation of right lower extremity: Secondary | ICD-10-CM | POA: Diagnosis not present

## 2018-11-09 DIAGNOSIS — Z89612 Acquired absence of left leg above knee: Secondary | ICD-10-CM

## 2018-11-09 DIAGNOSIS — Z89421 Acquired absence of other right toe(s): Secondary | ICD-10-CM | POA: Diagnosis not present

## 2018-11-09 DIAGNOSIS — L97919 Non-pressure chronic ulcer of unspecified part of right lower leg with unspecified severity: Secondary | ICD-10-CM | POA: Diagnosis not present

## 2018-11-09 DIAGNOSIS — S78112A Complete traumatic amputation at level between left hip and knee, initial encounter: Secondary | ICD-10-CM

## 2018-11-10 ENCOUNTER — Encounter: Payer: Self-pay | Admitting: Orthopedic Surgery

## 2018-11-10 NOTE — Progress Notes (Signed)
Office Visit Note   Patient: [name]           Date of Birth: 1950-12-19           MRN: [id number] Visit Date: 11/09/2018              Requested by: [name], MD [address] [place],  [place] [address] PCP: [name], MD  Chief Complaint  Patient presents with  . Right Foot - Follow-up    5th ray right foot  . Left Leg - Follow-up      HPI: Patient is a 68 year old gentleman who is status post a left above-knee amputation right foot fifth ray amputation and is currently in a Dynaflex wrap for venous insufficiency.  The patient has discontinued a nitroglycerin patch the foot wound is well-healed he has started wearing compression stockings with decreased swelling.  Assessment & Plan: Visit Diagnoses:  1. History of complete ray amputation of fifth toe of right foot ([place])   2. Idiopathic chronic venous hypertension of right lower extremity with ulcer and inflammation (HCC)   3. Unilateral AKA, left (HCC)     Plan: Patient states he would like to proceed with the medical compression stocking he will wear this around-the-clock change the stocking daily.  Follow-Up Instructions: Return in about 2 weeks (around 11/23/2018).   Ortho Exam  Patient is alert, oriented, no adenopathy, well-dressed, normal affect, normal respiratory effort. Examination patient has brawny skin color changes in the right lower extremity with a venous ulcer that is 9 x 8 cm and 1 mm deep there is has approximately 50% granulation tissue 50% fibrinous exudative tissue.  Imaging: No results found. No images are attached to the encounter.  Labs: Lab Results  Component Value Date   HGBA1C 6.4 (A) 12/12/2017   HGBA1C 7.3 (A) 08/05/2017   HGBA1C 6.3 03/06/2017   ESRSEDRATE 35 (H) 03/31/2013   CRP 5.3 (H) 03/31/2013   REPTSTATUS 12/25/2017 FINAL 12/20/2017   GRAMSTAIN  12/30/2012    FEW WBC PRESENT, PREDOMINANTLY PMN RARE SQUAMOUS EPITHELIAL CELLS PRESENT ABUNDANT GRAM  NEGATIVE RODS MODERATE GRAM POSITIVE COCCI IN PAIRS IN CLUSTERS   CULT  12/20/2017    NO GROWTH 5 DAYS Performed at [facility], [place] [address], [place], [place] [address]    LABORGA KLEBSIELLA OXYTOCA 12/15/2012   LABORGA PSEUDOMONAS AERUGINOSA 12/15/2012     Lab Results  Component Value Date   ALBUMIN 2.6 (L) 12/27/2017   ALBUMIN 2.7 (L) 12/26/2017   ALBUMIN 3.5 12/14/2017    No results found for: MG No results found for: VD25OH  No results found for: PREALBUMIN CBC EXTENDED Latest Ref Rng & Units 01/02/2018 12/27/2017 12/26/2017  WBC 4.0 - 10.5 K/uL 10.1 10.4 12.7(H)  RBC 4.22 - 5.81 MIL/uL 3.70(L) 3.59(L) 3.70(L)  HGB 13.0 - 17.0 g/dL 10.3(L) 9.9(L) 10.0(L)  HCT 39.0 - 52.0 % 31.6(L) 30.5(L) 31.2(L)  PLT 150 - 400 K/uL 227 252 262  NEUTROABS 1.7 - 7.7 K/uL 7.7 7.9(H) 10.1(H)  LYMPHSABS 0.7 - 4.0 K/uL 1.1 1.2 1.3     Body mass index is 33.38 kg/m.  Orders:  No orders of the defined types were placed in this encounter.  No orders of the defined types were placed in this encounter.    Procedures: No procedures performed  Clinical Data: No additional findings.  ROS:  All other systems negative, except as noted in the HPI. Review of Systems  Objective: Vital Signs: Ht  6\' 2"  (1.88 m)   Wt 260 lb (117.9 kg)   BMI 33.38 kg/m   Specialty Comments:  No specialty comments available.  PMFS History: Patient Active Problem List   Diagnosis Date Noted  . Hypoalbuminemia due to protein-calorie malnutrition ([place])   . Type 2 diabetes mellitus with peripheral neuropathy (HCC)   . Left above-knee amputee ([place]) 12/26/2017  . Unilateral AKA, left ([place])   . History of complete ray amputation of fifth toe of right foot ([place])   . Postoperative pain   . Essential hypertension   . PAD (peripheral artery disease) ([place]) 02/19/2017  . Chronic renal disease, stage III 02/19/2017  . Ulcer of toe of right foot, limited to breakdown of skin ([place]) 10/11/2016  .  Type 2 diabetes mellitus with diabetic polyneuropathy, with long-term current use of insulin ([place]) 03/29/2015  . Advance directive discussed with patient 02/07/2014  . Normocytic anemia 04/01/2013  . Diabetic Charcot foot ([place]) 04/01/2013  . Routine general medical examination at a health care facility 12/25/2010  . PERSONAL HX COLONIC POLYPS 11/22/2009  . Venous (peripheral) insufficiency 06/29/2008  . Osteoarthritis, multiple sites 04/03/2007  . ERECTILE DYSFUNCTION, ORGANIC 06/13/2006  . Hyperlipemia 06/11/2006  . GLAUCOMA 06/11/2006  . Coronary atherosclerosis of native coronary artery 06/11/2006  . Obstructive sleep apnea 06/11/2006   Past Medical History:  Diagnosis Date  . Cellulitis and abscess of left lower extremity 12/20/2017  . Coronary atherosclerosis of unspecified type of vessel, native or graft   . Diabetes mellitus without complication ([place])   . Hemorrhage of rectum and anus   . History of kidney stones   . Impotence of organic origin   . Internal hemorrhoids without mention of complication   . Left below-knee amputee ([place]) 02/23/2016  . Malignant neoplasm of prostate ([place])   . Mononeuritis of unspecified site   . Osteoarthrosis, unspecified whether generalized or localized, unspecified site   . Other and unspecified hyperlipidemia   . Personal history of colonic polyps   . Personal history of diabetic foot ulcer    saw wound center, resolved 05/08/2010  . Personal history of gallstones   . Routine general medical examination at a health care facility   . Subacute osteomyelitis, right ankle and foot ([place])   . Type II or unspecified type diabetes mellitus with neurological manifestations, not stated as uncontrolled(250.60)   . Unspecified glaucoma(365.9)   . Unspecified sleep apnea    cpcp  . Unspecified venous (peripheral) insufficiency     Family History  Problem Relation Age of Onset  . Lung cancer Father   . Multiple sclerosis Mother   . Heart attack Other         paternal aunts and uncles  . Peripheral vascular disease Maternal Grandfather        several amputations    Past Surgical History:  Procedure Laterality Date  . AMPUTATION Left 12/30/2012   Procedure: AMPUTATION RAY ;  Surgeon: Meredith Pel, MD;  [facility];  Service: Orthopedics;  Laterality: Left;  LEFT GREAT TOE RAY AMPUTATION  . AMPUTATION Left 02/23/2016   Procedure: Left Below Knee Amputation;  Surgeon: [name], MD;  Location: [facility];  Service: Orthopedics;  Laterality: Left;  . AMPUTATION Left 12/24/2017   Procedure: LEFT ABOVE KNEE AMPUTATION;  Surgeon: [name], MD;  Location: [facility];  Service: Orthopedics;  Laterality: Left;  . AMPUTATION Right 12/24/2017   Procedure: RIGHT 5TH RAY AMPUTATION;  Surgeon: [name], MD;  Location: [facility];  Service: Orthopedics;  Laterality: Right;  . APPENDECTOMY    . BASAL CELL CARCINOMA EXCISION  2/16   left forearm  . CARDIAC CATHETERIZATION  1998   Negative  . CATARACT EXTRACTION Right 2017   then lid surgery  . CORONARY ARTERY BYPASS GRAFT  09/2005   Post op AFIB  . EYE SURGERY    . FOOT BONE EXCISION Left 11/03/2013   DR Marzell Isakson   . I&D EXTREMITY Left 11/03/2013   Procedure: Left Foot Partial Bone Excision Cuboid and Medial Cuneiform, Wound Closures;  Surgeon: [name], MD;  Location: [facility];  Service: Orthopedics;  Laterality: Left;  . INSERTION PROSTATE RADIATION SEED  2009   RT and seeds for prostate cancer  . KIDNEY STONE SURGERY  04/1993  . RCA stents  04/2003   EF 55%  . RETINAL DETACHMENT SURGERY  2002-2003  . thrombosed vein  1993   Right leg   Social History   Occupational History  . Occupation: Heavy equipment mechanic--retired  Tobacco Use  . Smoking status: Former Smoker    Packs/day: 2.00    Years: 35.00    Pack years: 70.00    Types: Cigars, Cigarettes    Quit date: 01/07/2001    Years since quitting: 17.8  . Smokeless tobacco: Never Used  Substance and Sexual Activity  .  Alcohol use: Not Currently    Comment: rare  . Drug use: No  . Sexual activity: Yes    Partners: Female

## 2018-11-23 ENCOUNTER — Other Ambulatory Visit: Payer: Self-pay

## 2018-11-23 ENCOUNTER — Ambulatory Visit (INDEPENDENT_AMBULATORY_CARE_PROVIDER_SITE_OTHER): Payer: Medicare Other | Admitting: Orthopedic Surgery

## 2018-11-23 ENCOUNTER — Encounter: Payer: Self-pay | Admitting: Orthopedic Surgery

## 2018-11-23 VITALS — Ht 74.0 in | Wt 260.0 lb

## 2018-11-23 DIAGNOSIS — I87331 Chronic venous hypertension (idiopathic) with ulcer and inflammation of right lower extremity: Secondary | ICD-10-CM

## 2018-11-23 DIAGNOSIS — L97919 Non-pressure chronic ulcer of unspecified part of right lower leg with unspecified severity: Secondary | ICD-10-CM | POA: Diagnosis not present

## 2018-11-23 NOTE — Progress Notes (Signed)
Office Visit Note   Patient: [name]           Date of Birth: 1950-12-30           MRN: [id number] Visit Date: 11/23/2018              Requested by: [name], MD [place],  [place] [id number] PCP: [name], MD  Chief Complaint  Patient presents with  . Right Foot - Follow-up      HPI: Patient is here to follow up on his Right lower leg. He has a history of venous stasis disease and ulcers. He has been wearing a compression socks which he states have bunched up and caused his skin to breakdown. He is also concerned about a small lateral ulcer on the outside of his foot  Assessment & Plan: Visit Diagnoses: No diagnosis found.  Plan: He will begin Dynaflex wraps.Indurated areas on leg secondary to socks being applied without fully pulling them up  Follow-Up Instructions: Thursday then 2x a week while legs are being wrapped   Ortho Exam  Patient is alert, oriented, no adenopathy, well-dressed, normal affect, normal respiratory effort. RLE: moderate soft tissue swelling. Draining ulcers but centrally pink granulation tissue. Induration from where the sock was rolling down and wrinkling.  Lateral foot small 1x1 ulcer does not probe deep. No surrounding cellulitis.   Imaging: No results found. No images are attached to the encounter.  Labs: Lab Results  Component Value Date   HGBA1C 6.4 (A) 12/12/2017   HGBA1C 7.3 (A) 08/05/2017   HGBA1C 6.3 03/06/2017   ESRSEDRATE 35 (H) 03/31/2013   CRP 5.3 (H) 03/31/2013   REPTSTATUS 12/25/2017 FINAL 12/20/2017   GRAMSTAIN  12/30/2012    FEW WBC PRESENT, PREDOMINANTLY PMN RARE SQUAMOUS EPITHELIAL CELLS PRESENT ABUNDANT GRAM NEGATIVE RODS MODERATE GRAM POSITIVE COCCI IN PAIRS IN CLUSTERS   CULT  12/20/2017    NO GROWTH 5 DAYS Performed at [facility], [place] [address], [place], [place] [address]    LABORGA KLEBSIELLA OXYTOCA 12/15/2012   LABORGA PSEUDOMONAS AERUGINOSA 12/15/2012     Lab Results  Component Value Date   ALBUMIN 2.6 (L) 12/27/2017   ALBUMIN 2.7 (L) 12/26/2017   ALBUMIN 3.5 12/14/2017    No results found for: MG No results found for: VD25OH  No results found for: PREALBUMIN CBC EXTENDED Latest Ref Rng & Units 01/02/2018 12/27/2017 12/26/2017  WBC 4.0 - 10.5 K/uL 10.1 10.4 12.7(H)  RBC 4.22 - 5.81 MIL/uL 3.70(L) 3.59(L) 3.70(L)  HGB 13.0 - 17.0 g/dL 10.3(L) 9.9(L) 10.0(L)  HCT 39.0 - 52.0 % 31.6(L) 30.5(L) 31.2(L)  PLT 150 - 400 K/uL 227 252 262  NEUTROABS 1.7 - 7.7 K/uL 7.7 7.9(H) 10.1(H)  LYMPHSABS 0.7 - 4.0 K/uL 1.1 1.2 1.3     Body mass index is 33.38 kg/m.  Orders:  No orders of the defined types were placed in this encounter.  No orders of the defined types were placed in this encounter.    Procedures: No procedures performed  Clinical Data: No additional findings.  ROS:  All other systems negative, except as noted in the HPI. Review of Systems  Objective: Vital Signs: Ht 6\' 2"  (1.88 m)   Wt 260 lb (117.9 kg)   BMI 33.38 kg/m   Specialty Comments:  No specialty comments available.  PMFS History: Patient Active Problem List   Diagnosis Date Noted  . Hypoalbuminemia due to protein-calorie malnutrition ([place])   .  Type 2 diabetes mellitus with peripheral neuropathy (HCC)   . Left above-knee amputee ([place]) 12/26/2017  . Unilateral AKA, left ([place])   . History of complete ray amputation of fifth toe of right foot ([place])   . Postoperative pain   . Essential hypertension   . PAD (peripheral artery disease) ([place]) 02/19/2017  . Chronic renal disease, stage III 02/19/2017  . Ulcer of toe of right foot, limited to breakdown of skin ([place]) 10/11/2016  . Type 2 diabetes mellitus with diabetic polyneuropathy, with long-term current use of insulin ([place]) 03/29/2015  . Advance directive discussed with patient 02/07/2014  . Normocytic anemia 04/01/2013  . Diabetic Charcot foot ([place]) 04/01/2013  . Routine general medical  examination at a health care facility 12/25/2010  . PERSONAL HX COLONIC POLYPS 11/22/2009  . Venous (peripheral) insufficiency 06/29/2008  . Osteoarthritis, multiple sites 04/03/2007  . ERECTILE DYSFUNCTION, ORGANIC 06/13/2006  . Hyperlipemia 06/11/2006  . GLAUCOMA 06/11/2006  . Coronary atherosclerosis of native coronary artery 06/11/2006  . Obstructive sleep apnea 06/11/2006   Past Medical History:  Diagnosis Date  . Cellulitis and abscess of left lower extremity 12/20/2017  . Coronary atherosclerosis of unspecified type of vessel, native or graft   . Diabetes mellitus without complication ([place])   . Hemorrhage of rectum and anus   . History of kidney stones   . Impotence of organic origin   . Internal hemorrhoids without mention of complication   . Left below-knee amputee ([place]) 02/23/2016  . Malignant neoplasm of prostate ([place])   . Mononeuritis of unspecified site   . Osteoarthrosis, unspecified whether generalized or localized, unspecified site   . Other and unspecified hyperlipidemia   . Personal history of colonic polyps   . Personal history of diabetic foot ulcer    saw wound center, resolved 05/08/2010  . Personal history of gallstones   . Routine general medical examination at a health care facility   . Subacute osteomyelitis, right ankle and foot ([place])   . Type II or unspecified type diabetes mellitus with neurological manifestations, not stated as uncontrolled(250.60)   . Unspecified glaucoma(365.9)   . Unspecified sleep apnea    cpcp  . Unspecified venous (peripheral) insufficiency     Family History  Problem Relation Age of Onset  . Lung cancer Father   . Multiple sclerosis Mother   . Heart attack Other        paternal aunts and uncles  . Peripheral vascular disease Maternal Grandfather        several amputations    Past Surgical History:  Procedure Laterality Date  . AMPUTATION Left 12/30/2012   Procedure: AMPUTATION RAY ;  Surgeon: Meredith Pel, MD;   [facility];  Service: Orthopedics;  Laterality: Left;  LEFT GREAT TOE RAY AMPUTATION  . AMPUTATION Left 02/23/2016   Procedure: Left Below Knee Amputation;  Surgeon: [name], MD;  Location: [facility];  Service: Orthopedics;  Laterality: Left;  . AMPUTATION Left 12/24/2017   Procedure: LEFT ABOVE KNEE AMPUTATION;  Surgeon: [name], MD;  Location: [place];  Service: Orthopedics;  Laterality: Left;  . AMPUTATION Right 12/24/2017   Procedure: RIGHT 5TH RAY AMPUTATION;  Surgeon: [name], MD;  Location: [facility];  Service: Orthopedics;  Laterality: Right;  . APPENDECTOMY    . BASAL CELL CARCINOMA EXCISION  2/16   left forearm  . CARDIAC CATHETERIZATION  1998   Negative  . CATARACT EXTRACTION Right 2017   then lid surgery  .  CORONARY ARTERY BYPASS GRAFT  09/2005   Post op AFIB  . EYE SURGERY    . FOOT BONE EXCISION Left 11/03/2013   DR DUDA   . I&D EXTREMITY Left 11/03/2013   Procedure: Left Foot Partial Bone Excision Cuboid and Medial Cuneiform, Wound Closures;  Surgeon: [name], MD;  Location: [place];  Service: Orthopedics;  Laterality: Left;  . INSERTION PROSTATE RADIATION SEED  2009   RT and seeds for prostate cancer  . KIDNEY STONE SURGERY  04/1993  . RCA stents  04/2003   EF 55%  . RETINAL DETACHMENT SURGERY  2002-2003  . thrombosed vein  1993   Right leg   Social History   Occupational History  . Occupation: Heavy equipment mechanic--retired  Tobacco Use  . Smoking status: Former Smoker    Packs/day: 2.00    Years: 35.00    Pack years: 70.00    Types: Cigars, Cigarettes    Quit date: 01/07/2001    Years since quitting: 17.8  . Smokeless tobacco: Never Used  Substance and Sexual Activity  . Alcohol use: Not Currently    Comment: rare  . Drug use: No  . Sexual activity: Yes    Partners: Female

## 2018-11-26 ENCOUNTER — Encounter: Payer: Self-pay | Admitting: Physician Assistant

## 2018-11-26 ENCOUNTER — Ambulatory Visit (INDEPENDENT_AMBULATORY_CARE_PROVIDER_SITE_OTHER): Payer: Medicare Other | Admitting: Orthopedic Surgery

## 2018-11-26 ENCOUNTER — Other Ambulatory Visit: Payer: Self-pay

## 2018-11-26 VITALS — Ht 74.0 in | Wt 260.0 lb

## 2018-11-26 DIAGNOSIS — I87331 Chronic venous hypertension (idiopathic) with ulcer and inflammation of right lower extremity: Secondary | ICD-10-CM

## 2018-11-26 DIAGNOSIS — I70261 Atherosclerosis of native arteries of extremities with gangrene, right leg: Secondary | ICD-10-CM | POA: Diagnosis not present

## 2018-11-26 DIAGNOSIS — L97919 Non-pressure chronic ulcer of unspecified part of right lower leg with unspecified severity: Secondary | ICD-10-CM

## 2018-11-26 DIAGNOSIS — S78112A Complete traumatic amputation at level between left hip and knee, initial encounter: Secondary | ICD-10-CM

## 2018-11-30 ENCOUNTER — Ambulatory Visit: Payer: Medicare Other | Admitting: Internal Medicine

## 2018-11-30 ENCOUNTER — Ambulatory Visit (INDEPENDENT_AMBULATORY_CARE_PROVIDER_SITE_OTHER): Payer: Medicare Other | Admitting: Orthopedic Surgery

## 2018-11-30 ENCOUNTER — Other Ambulatory Visit: Payer: Self-pay

## 2018-11-30 ENCOUNTER — Encounter: Payer: Self-pay | Admitting: Physician Assistant

## 2018-11-30 VITALS — Ht 74.0 in | Wt 260.0 lb

## 2018-11-30 DIAGNOSIS — L97919 Non-pressure chronic ulcer of unspecified part of right lower leg with unspecified severity: Secondary | ICD-10-CM

## 2018-11-30 DIAGNOSIS — I87331 Chronic venous hypertension (idiopathic) with ulcer and inflammation of right lower extremity: Secondary | ICD-10-CM

## 2018-11-30 DIAGNOSIS — I70261 Atherosclerosis of native arteries of extremities with gangrene, right leg: Secondary | ICD-10-CM | POA: Diagnosis not present

## 2018-12-02 ENCOUNTER — Ambulatory Visit (INDEPENDENT_AMBULATORY_CARE_PROVIDER_SITE_OTHER): Payer: Medicare Other | Admitting: Physician Assistant

## 2018-12-02 ENCOUNTER — Encounter: Payer: Self-pay | Admitting: Physician Assistant

## 2018-12-02 ENCOUNTER — Other Ambulatory Visit: Payer: Self-pay

## 2018-12-02 VITALS — Ht 74.0 in | Wt 260.0 lb

## 2018-12-02 DIAGNOSIS — L97919 Non-pressure chronic ulcer of unspecified part of right lower leg with unspecified severity: Secondary | ICD-10-CM | POA: Diagnosis not present

## 2018-12-02 DIAGNOSIS — I872 Venous insufficiency (chronic) (peripheral): Secondary | ICD-10-CM | POA: Diagnosis not present

## 2018-12-02 NOTE — Progress Notes (Signed)
Office Visit Note   Patient: [name]           Date of Birth: July 01, 1950           MRN: [id number] Visit Date: 12/02/2018              Requested by: [name], MD [address] [place],  [place] [address] PCP: [name], MD  Chief Complaint  Patient presents with  . Right Leg - Follow-up    RLE ulceration       HPI: Right lower patient has been having Dynaflex wraps twice a week.  He feels he is doing better and the swelling has decreased   Assessment & Plan: Visit Diagnoses: No diagnosis found.  Plan: We will be rewrapped in Dynaflex wraps today he already has a follow-up on Monday  Follow-Up Instructions: No follow-ups on file.   Ortho Exam  Patient is alert, oriented, no adenopathy, well-dressed, normal affect, normal respiratory effort. Right lower extremity bleeding granulation tissue no necrotic tissue swelling is controlled  Imaging: No results found. No images are attached to the encounter.  Labs: Lab Results  Component Value Date   HGBA1C 6.4 (A) 12/12/2017   HGBA1C 7.3 (A) 08/05/2017   HGBA1C 6.3 03/06/2017   ESRSEDRATE 35 (H) 03/31/2013   CRP 5.3 (H) 03/31/2013   REPTSTATUS 12/25/2017 FINAL 12/20/2017   GRAMSTAIN  12/30/2012    FEW WBC PRESENT, PREDOMINANTLY PMN RARE SQUAMOUS EPITHELIAL CELLS PRESENT ABUNDANT GRAM NEGATIVE RODS MODERATE GRAM POSITIVE COCCI IN PAIRS IN CLUSTERS   CULT  12/20/2017    NO GROWTH 5 DAYS Performed at [facility], [place] [address] [place], [place] [address]    LABORGA KLEBSIELLA OXYTOCA 12/15/2012   LABORGA PSEUDOMONAS AERUGINOSA 12/15/2012     Lab Results  Component Value Date   ALBUMIN 2.6 (L) 12/27/2017   ALBUMIN 2.7 (L) 12/26/2017   ALBUMIN 3.5 12/14/2017    No results found for: MG No results found for: VD25OH  No results found for: PREALBUMIN CBC EXTENDED Latest Ref Rng & Units 01/02/2018 12/27/2017 12/26/2017  WBC 4.0 - 10.5 K/uL 10.1 10.4 12.7(H)  RBC 4.22 -  5.81 MIL/uL 3.70(L) 3.59(L) 3.70(L)  HGB 13.0 - 17.0 g/dL 10.3(L) 9.9(L) 10.0(L)  HCT 39.0 - 52.0 % 31.6(L) 30.5(L) 31.2(L)  PLT 150 - 400 K/uL 227 252 262  NEUTROABS 1.7 - 7.7 K/uL 7.7 7.9(H) 10.1(H)  LYMPHSABS 0.7 - 4.0 K/uL 1.1 1.2 1.3     Body mass index is 33.38 kg/m.  Orders:  No orders of the defined types were placed in this encounter.  No orders of the defined types were placed in this encounter.    Procedures: No procedures performed  Clinical Data: No additional findings.  ROS:  All other systems negative, except as noted in the HPI. Review of Systems  Objective: Vital Signs: Ht 6\' 2"  (1.88 m)   Wt 260 lb (117.9 kg)   BMI 33.38 kg/m   Specialty Comments:  No specialty comments available.  PMFS History: Patient Active Problem List   Diagnosis Date Noted  . Hypoalbuminemia due to protein-calorie malnutrition ([place])   . Type 2 diabetes mellitus with peripheral neuropathy (HCC)   . Left above-knee amputee ([place]) 12/26/2017  . Unilateral AKA, left ([place])   . History of complete ray amputation of fifth toe of right foot ([place])   . Postoperative pain   . Essential hypertension   . PAD (peripheral artery disease) ([place]) 02/19/2017  .  Chronic renal disease, stage III 02/19/2017  . Ulcer of toe of right foot, limited to breakdown of skin ([place]) 10/11/2016  . Type 2 diabetes mellitus with diabetic polyneuropathy, with long-term current use of insulin ([place]) 03/29/2015  . Advance directive discussed with patient 02/07/2014  . Normocytic anemia 04/01/2013  . Diabetic Charcot foot ([place]) 04/01/2013  . Routine general medical examination at a health care facility 12/25/2010  . PERSONAL HX COLONIC POLYPS 11/22/2009  . Venous (peripheral) insufficiency 06/29/2008  . Osteoarthritis, multiple sites 04/03/2007  . ERECTILE DYSFUNCTION, ORGANIC 06/13/2006  . Hyperlipemia 06/11/2006  . GLAUCOMA 06/11/2006  . Coronary atherosclerosis of native coronary artery 06/11/2006  .  Obstructive sleep apnea 06/11/2006   Past Medical History:  Diagnosis Date  . Cellulitis and abscess of left lower extremity 12/20/2017  . Coronary atherosclerosis of unspecified type of vessel, native or graft   . Diabetes mellitus without complication ([place])   . Hemorrhage of rectum and anus   . History of kidney stones   . Impotence of organic origin   . Internal hemorrhoids without mention of complication   . Left below-knee amputee ([place]) 02/23/2016  . Malignant neoplasm of prostate ([place])   . Mononeuritis of unspecified site   . Osteoarthrosis, unspecified whether generalized or localized, unspecified site   . Other and unspecified hyperlipidemia   . Personal history of colonic polyps   . Personal history of diabetic foot ulcer    saw wound center, resolved 05/08/2010  . Personal history of gallstones   . Routine general medical examination at a health care facility   . Subacute osteomyelitis, right ankle and foot ([place])   . Type II or unspecified type diabetes mellitus with neurological manifestations, not stated as uncontrolled(250.60)   . Unspecified glaucoma(365.9)   . Unspecified sleep apnea    cpcp  . Unspecified venous (peripheral) insufficiency     Family History  Problem Relation Age of Onset  . Lung cancer Father   . Multiple sclerosis Mother   . Heart attack Other        paternal aunts and uncles  . Peripheral vascular disease Maternal Grandfather        several amputations    Past Surgical History:  Procedure Laterality Date  . AMPUTATION Left 12/30/2012   Procedure: AMPUTATION RAY ;  Surgeon: Meredith Pel, MD;  [facility];  Service: Orthopedics;  Laterality: Left;  LEFT GREAT TOE RAY AMPUTATION  . AMPUTATION Left 02/23/2016   Procedure: Left Below Knee Amputation;  Surgeon: [name], MD;  Location: [facility];  Service: Orthopedics;  Laterality: Left;  . AMPUTATION Left 12/24/2017   Procedure: LEFT ABOVE KNEE AMPUTATION;  Surgeon: [name], MD;   Location: [facility];  Service: Orthopedics;  Laterality: Left;  . AMPUTATION Right 12/24/2017   Procedure: RIGHT 5TH RAY AMPUTATION;  Surgeon: [name], MD;  Location: [facility];  Service: Orthopedics;  Laterality: Right;  . APPENDECTOMY    . BASAL CELL CARCINOMA EXCISION  2/16   left forearm  . CARDIAC CATHETERIZATION  1998   Negative  . CATARACT EXTRACTION Right 2017   then lid surgery  . CORONARY ARTERY BYPASS GRAFT  09/2005   Post op AFIB  . EYE SURGERY    . FOOT BONE EXCISION Left 11/03/2013   DR DUDA   . I&D EXTREMITY Left 11/03/2013   Procedure: Left Foot Partial Bone Excision Cuboid and Medial Cuneiform, Wound Closures;  Surgeon: [name], MD;  Location: [name];  Service: Orthopedics;  Laterality: Left;  . INSERTION PROSTATE RADIATION SEED  2009   RT and seeds for prostate cancer  . KIDNEY STONE SURGERY  04/1993  . RCA stents  04/2003   EF 55%  . RETINAL DETACHMENT SURGERY  2002-2003  . thrombosed vein  1993   Right leg   Social History   Occupational History  . Occupation: Heavy equipment mechanic--retired  Tobacco Use  . Smoking status: Former Smoker    Packs/day: 2.00    Years: 35.00    Pack years: 70.00    Types: Cigars, Cigarettes    Quit date: 01/07/2001    Years since quitting: 17.9  . Smokeless tobacco: Never Used  Substance and Sexual Activity  . Alcohol use: Not Currently    Comment: rare  . Drug use: No  . Sexual activity: Yes    Partners: Female

## 2018-12-07 ENCOUNTER — Other Ambulatory Visit: Payer: Self-pay

## 2018-12-07 ENCOUNTER — Ambulatory Visit (INDEPENDENT_AMBULATORY_CARE_PROVIDER_SITE_OTHER): Payer: Medicare Other | Admitting: Orthopedic Surgery

## 2018-12-07 ENCOUNTER — Encounter: Payer: Self-pay | Admitting: Orthopedic Surgery

## 2018-12-07 VITALS — Ht 74.0 in | Wt 260.0 lb

## 2018-12-07 DIAGNOSIS — L97919 Non-pressure chronic ulcer of unspecified part of right lower leg with unspecified severity: Secondary | ICD-10-CM | POA: Diagnosis not present

## 2018-12-07 DIAGNOSIS — I87331 Chronic venous hypertension (idiopathic) with ulcer and inflammation of right lower extremity: Secondary | ICD-10-CM

## 2018-12-07 DIAGNOSIS — I70261 Atherosclerosis of native arteries of extremities with gangrene, right leg: Secondary | ICD-10-CM | POA: Diagnosis not present

## 2018-12-07 NOTE — Progress Notes (Signed)
Office Visit Note   Patient: [name]           Date of Birth: 12-04-1950           MRN: [id number] Visit Date: 12/07/2018              Requested by: [name], MD [place],  [place] [id number] PCP: [name], MD  Chief Complaint  Patient presents with  . Right Leg - Follow-up      HPI: Patient is a 68 year old gentleman who presents in follow-up for ulceration right lower extremity.  Patient has progressive gangrenous changes to the right leg wound he has been undergoing twice a week dressing changes to help with the venous insufficiency and despite the aggressive wound care patient has progressive gangrenous necrotic foul-smelling drainage from the right leg.  Patient did not get his prosthetic limb on the left secondary to COVID-19 he did not want to go outside the home with his medical problems to be fit for prosthesis as well as go to therapy.  Assessment & Plan: Visit Diagnoses:  1. Idiopathic chronic venous hypertension of right lower extremity with ulcer and inflammation (HCC)   2. Atherosclerosis of native arteries of extremities with gangrene, right leg (HCC)     Plan: Patient does not have limb salvage options for the right lower extremity he has a strong pulse dorsalis pedis and posterior tibial on the right with monophasic flow there is no large vessel insufficiency his problems are microcirculation.  We will plan for a above-the-knee amputation on the right.  Risks and benefits were discussed including risk of the wound not healing need for additional surgery.  Discussed the patient will need rehab either inpatient or outpatient.  Patient will need short transfer legs.  Follow-Up Instructions: Return in about 2 weeks (around 12/21/2018).   Ortho Exam  Patient is alert, oriented, no adenopathy, well-dressed, normal affect, normal respiratory effort. Examination patient has progressive foul-smelling necrotic changes almost the  entire right calf.  This extends from the tibial tubercle down to the ankle there is ischemic ulcer over the great toe as well as over the fifth ray amputation with incision primarily healed.  A Doppler was used and patient has a strong dorsalis pedis and posterior tibial pulse which is monophasic.  Patient has good hair growth to the knee.  Imaging: No results found. No images are attached to the encounter.  Labs: Lab Results  Component Value Date   HGBA1C 6.4 (A) 12/12/2017   HGBA1C 7.3 (A) 08/05/2017   HGBA1C 6.3 03/06/2017   ESRSEDRATE 35 (H) 03/31/2013   CRP 5.3 (H) 03/31/2013   REPTSTATUS 12/25/2017 FINAL 12/20/2017   GRAMSTAIN  12/30/2012    FEW WBC PRESENT, PREDOMINANTLY PMN RARE SQUAMOUS EPITHELIAL CELLS PRESENT ABUNDANT GRAM NEGATIVE RODS MODERATE GRAM POSITIVE COCCI IN PAIRS IN CLUSTERS   CULT  12/20/2017    NO GROWTH 5 DAYS Performed at [facility], [place] [address] [place], [place] [address]    LABORGA KLEBSIELLA OXYTOCA 12/15/2012   LABORGA PSEUDOMONAS AERUGINOSA 12/15/2012     Lab Results  Component Value Date   ALBUMIN 2.6 (L) 12/27/2017   ALBUMIN 2.7 (L) 12/26/2017   ALBUMIN 3.5 12/14/2017    No results found for: MG No results found for: VD25OH  No results found for: PREALBUMIN CBC EXTENDED Latest Ref Rng & Units 01/02/2018 12/27/2017 12/26/2017  WBC 4.0 - 10.5 K/uL 10.1 10.4 12.7(H)  RBC 4.22 -  5.81 MIL/uL 3.70(L) 3.59(L) 3.70(L)  HGB 13.0 - 17.0 g/dL 10.3(L) 9.9(L) 10.0(L)  HCT 39.0 - 52.0 % 31.6(L) 30.5(L) 31.2(L)  PLT 150 - 400 K/uL 227 252 262  NEUTROABS 1.7 - 7.7 K/uL 7.7 7.9(H) 10.1(H)  LYMPHSABS 0.7 - 4.0 K/uL 1.1 1.2 1.3     Body mass index is 33.38 kg/m.  Orders:  No orders of the defined types were placed in this encounter.  No orders of the defined types were placed in this encounter.    Procedures: No procedures performed  Clinical Data: No additional findings.  ROS:  All other systems negative, except as noted  in the HPI. Review of Systems  Objective: Vital Signs: Ht 6\' 2"  (1.88 m)   Wt 260 lb (117.9 kg)   BMI 33.38 kg/m   Specialty Comments:  No specialty comments available.  PMFS History: Patient Active Problem List   Diagnosis Date Noted  . Hypoalbuminemia due to protein-calorie malnutrition ([place])   . Type 2 diabetes mellitus with peripheral neuropathy (HCC)   . Left above-knee amputee ([place]) 12/26/2017  . Unilateral AKA, left ([place])   . History of complete ray amputation of fifth toe of right foot ([place])   . Postoperative pain   . Essential hypertension   . PAD (peripheral artery disease) ([place]) 02/19/2017  . Chronic renal disease, stage III 02/19/2017  . Ulcer of toe of right foot, limited to breakdown of skin ([place]) 10/11/2016  . Type 2 diabetes mellitus with diabetic polyneuropathy, with long-term current use of insulin ([place]) 03/29/2015  . Advance directive discussed with patient 02/07/2014  . Normocytic anemia 04/01/2013  . Diabetic Charcot foot ([place]) 04/01/2013  . Routine general medical examination at a health care facility 12/25/2010  . PERSONAL HX COLONIC POLYPS 11/22/2009  . Venous (peripheral) insufficiency 06/29/2008  . Osteoarthritis, multiple sites 04/03/2007  . ERECTILE DYSFUNCTION, ORGANIC 06/13/2006  . Hyperlipemia 06/11/2006  . GLAUCOMA 06/11/2006  . Coronary atherosclerosis of native coronary artery 06/11/2006  . Obstructive sleep apnea 06/11/2006   Past Medical History:  Diagnosis Date  . Cellulitis and abscess of left lower extremity 12/20/2017  . Coronary atherosclerosis of unspecified type of vessel, native or graft   . Diabetes mellitus without complication ([place])   . Hemorrhage of rectum and anus   . History of kidney stones   . Impotence of organic origin   . Internal hemorrhoids without mention of complication   . Left below-knee amputee ([place]) 02/23/2016  . Malignant neoplasm of prostate ([place])   . Mononeuritis of unspecified site   . Osteoarthrosis,  unspecified whether generalized or localized, unspecified site   . Other and unspecified hyperlipidemia   . Personal history of colonic polyps   . Personal history of diabetic foot ulcer    saw wound center, resolved 05/08/2010  . Personal history of gallstones   . Routine general medical examination at a health care facility   . Subacute osteomyelitis, right ankle and foot ([place])   . Type II or unspecified type diabetes mellitus with neurological manifestations, not stated as uncontrolled(250.60)   . Unspecified glaucoma(365.9)   . Unspecified sleep apnea    cpcp  . Unspecified venous (peripheral) insufficiency     Family History  Problem Relation Age of Onset  . Lung cancer Father   . Multiple sclerosis Mother   . Heart attack Other        paternal aunts and uncles  . Peripheral vascular disease Maternal Grandfather  several amputations    Past Surgical History:  Procedure Laterality Date  . AMPUTATION Left 12/30/2012   Procedure: AMPUTATION RAY ;  Surgeon: Meredith Pel, MD;  [facility];  Service: Orthopedics;  Laterality: Left;  LEFT GREAT TOE RAY AMPUTATION  . AMPUTATION Left 02/23/2016   Procedure: Left Below Knee Amputation;  Surgeon: [name], MD;  Location: [facility];  Service: Orthopedics;  Laterality: Left;  . AMPUTATION Left 12/24/2017   Procedure: LEFT ABOVE KNEE AMPUTATION;  Surgeon: [name], MD;  Location: [facility];  Service: Orthopedics;  Laterality: Left;  . AMPUTATION Right 12/24/2017   Procedure: RIGHT 5TH RAY AMPUTATION;  Surgeon: [name], MD;  Location: [facility];  Service: Orthopedics;  Laterality: Right;  . APPENDECTOMY    . BASAL CELL CARCINOMA EXCISION  2/16   left forearm  . CARDIAC CATHETERIZATION  1998   Negative  . CATARACT EXTRACTION Right 2017   then lid surgery  . CORONARY ARTERY BYPASS GRAFT  09/2005   Post op AFIB  . EYE SURGERY    . FOOT BONE EXCISION Left 11/03/2013   DR Nikko Quast   . I&D EXTREMITY Left 11/03/2013    Procedure: Left Foot Partial Bone Excision Cuboid and Medial Cuneiform, Wound Closures;  Surgeon: [name], MD;  Location: [facility];  Service: Orthopedics;  Laterality: Left;  . INSERTION PROSTATE RADIATION SEED  2009   RT and seeds for prostate cancer  . KIDNEY STONE SURGERY  04/1993  . RCA stents  04/2003   EF 55%  . RETINAL DETACHMENT SURGERY  2002-2003  . thrombosed vein  1993   Right leg   Social History   Occupational History  . Occupation: Heavy equipment mechanic--retired  Tobacco Use  . Smoking status: Former Smoker    Packs/day: 2.00    Years: 35.00    Pack years: 70.00    Types: Cigars, Cigarettes    Quit date: 01/07/2001    Years since quitting: 17.9  . Smokeless tobacco: Never Used  Substance and Sexual Activity  . Alcohol use: Not Currently    Comment: rare  . Drug use: No  . Sexual activity: Yes    Partners: Female

## 2018-12-08 ENCOUNTER — Other Ambulatory Visit: Payer: Self-pay | Admitting: Physician Assistant

## 2018-12-08 ENCOUNTER — Encounter: Payer: Self-pay | Admitting: Orthopedic Surgery

## 2018-12-08 NOTE — Progress Notes (Signed)
Office Visit Note   Patient: [name]           Date of Birth: [birth year]           MRN: [id number] Visit Date: 11/26/2018              Requested by: [name], MD [place],  [place] [address] PCP: [name], MD  Chief Complaint  Patient presents with  . Right Leg - Follow-up      HPI:  A 68 year old gentleman is seen in follow-up for venous ulceration right lower extremity patient states he has had increased odor and drainage.  He is currently on probiotics Trental D3 and proteinIs supplements. Assessment & Plan: Visit Diagnoses:  1. Idiopathic chronic venous hypertension of right lower extremity with ulcer and inflammation (HCC)     Plan: Patient has increased drainage increased ischemic changes to the venous ulcers.  We will follow closely with follow-up twice a week.  Follow-Up Instructions: Return in about 1 week (around 12/03/2018).   Ortho Exam  Patient is alert, oriented, no adenopathy, well-dressed, normal affect, normal respiratory effort. Examination patient has extensive ulceration to the right lower extremity with venous insufficiency and arterial insufficiency.  He is currently on probiotics Trental vitamin D3 and protein supplements.  The ulcers are thin atrophic with clear drainage.  No ascending cellulitis.  Imaging: No results found. No images are attached to the encounter.  Labs: Lab Results  Component Value Date   HGBA1C 6.4 (A) 12/12/2017   HGBA1C 7.3 (A) 08/05/2017   HGBA1C 6.3 03/06/2017   ESRSEDRATE 35 (H) 03/31/2013   CRP 5.3 (H) 03/31/2013   REPTSTATUS 12/25/2017 FINAL 12/20/2017   GRAMSTAIN  12/30/2012    FEW WBC PRESENT, PREDOMINANTLY PMN RARE SQUAMOUS EPITHELIAL CELLS PRESENT ABUNDANT GRAM NEGATIVE RODS MODERATE GRAM POSITIVE COCCI IN PAIRS IN CLUSTERS   CULT  12/20/2017    NO GROWTH 5 DAYS Performed at [facility], [place] [address], [place], [place] [address]    LABORGA KLEBSIELLA  OXYTOCA 12/15/2012   LABORGA PSEUDOMONAS AERUGINOSA 12/15/2012     Lab Results  Component Value Date   ALBUMIN 2.6 (L) 12/27/2017   ALBUMIN 2.7 (L) 12/26/2017   ALBUMIN 3.5 12/14/2017    No results found for: MG No results found for: VD25OH  No results found for: PREALBUMIN CBC EXTENDED Latest Ref Rng & Units 01/02/2018 12/27/2017 12/26/2017  WBC 4.0 - 10.5 K/uL 10.1 10.4 12.7(H)  RBC 4.22 - 5.81 MIL/uL 3.70(L) 3.59(L) 3.70(L)  HGB 13.0 - 17.0 g/dL 10.3(L) 9.9(L) 10.0(L)  HCT 39.0 - 52.0 % 31.6(L) 30.5(L) 31.2(L)  PLT 150 - 400 K/uL 227 252 262  NEUTROABS 1.7 - 7.7 K/uL 7.7 7.9(H) 10.1(H)  LYMPHSABS 0.7 - 4.0 K/uL 1.1 1.2 1.3     Body mass index is 33.38 kg/m.  Orders:  No orders of the defined types were placed in this encounter.  No orders of the defined types were placed in this encounter.    Procedures: No procedures performed  Clinical Data: No additional findings.  ROS:  All other systems negative, except as noted in the HPI. Review of Systems  Objective: Vital Signs: Ht 6\' 2"  (1.88 m)   Wt 260 lb (117.9 kg)   BMI 33.38 kg/m   Specialty Comments:  No specialty comments available.  PMFS History: Patient Active Problem List   Diagnosis Date Noted  . Hypoalbuminemia due to protein-calorie malnutrition ([place])   .  Type 2 diabetes mellitus with peripheral neuropathy (HCC)   . Left above-knee amputee ([place]) 12/26/2017  . Unilateral AKA, left ([place])   . History of complete ray amputation of fifth toe of right foot ([place])   . Postoperative pain   . Essential hypertension   . PAD (peripheral artery disease) ([place]) 02/19/2017  . Chronic renal disease, stage III 02/19/2017  . Ulcer of toe of right foot, limited to breakdown of skin ([place]) 10/11/2016  . Type 2 diabetes mellitus with diabetic polyneuropathy, with long-term current use of insulin ([place]) 03/29/2015  . Advance directive discussed with patient 02/07/2014  . Normocytic anemia 04/01/2013  . Diabetic  Charcot foot ([place]) 04/01/2013  . Routine general medical examination at a health care facility 12/25/2010  . PERSONAL HX COLONIC POLYPS 11/22/2009  . Venous (peripheral) insufficiency 06/29/2008  . Osteoarthritis, multiple sites 04/03/2007  . ERECTILE DYSFUNCTION, ORGANIC 06/13/2006  . Hyperlipemia 06/11/2006  . GLAUCOMA 06/11/2006  . Coronary atherosclerosis of native coronary artery 06/11/2006  . Obstructive sleep apnea 06/11/2006   Past Medical History:  Diagnosis Date  . Cellulitis and abscess of left lower extremity 12/20/2017  . Coronary atherosclerosis of unspecified type of vessel, native or graft   . Diabetes mellitus without complication ([place])   . Hemorrhage of rectum and anus   . History of kidney stones   . Impotence of organic origin   . Internal hemorrhoids without mention of complication   . Left below-knee amputee ([place]) 02/23/2016  . Malignant neoplasm of prostate ([place])   . Mononeuritis of unspecified site   . Osteoarthrosis, unspecified whether generalized or localized, unspecified site   . Other and unspecified hyperlipidemia   . Personal history of colonic polyps   . Personal history of diabetic foot ulcer    saw wound center, resolved 05/08/2010  . Personal history of gallstones   . Routine general medical examination at a health care facility   . Subacute osteomyelitis, right ankle and foot ([place])   . Type II or unspecified type diabetes mellitus with neurological manifestations, not stated as uncontrolled(250.60)   . Unspecified glaucoma(365.9)   . Unspecified sleep apnea    cpcp  . Unspecified venous (peripheral) insufficiency     Family History  Problem Relation Age of Onset  . Lung cancer Father   . Multiple sclerosis Mother   . Heart attack Other        paternal aunts and uncles  . Peripheral vascular disease Maternal Grandfather        several amputations    Past Surgical History:  Procedure Laterality Date  . AMPUTATION Left 12/30/2012    Procedure: AMPUTATION RAY ;  Surgeon: Meredith Pel, MD;  Location: [facility];  Service: Orthopedics;  Laterality: Left;  LEFT GREAT TOE RAY AMPUTATION  . AMPUTATION Left 02/23/2016   Procedure: Left Below Knee Amputation;  Surgeon: [name], MD;  Location: [facility];  Service: Orthopedics;  Laterality: Left;  . AMPUTATION Left 12/24/2017   Procedure: LEFT ABOVE KNEE AMPUTATION;  Surgeon: [name], MD;  Location: [facility];  Service: Orthopedics;  Laterality: Left;  . AMPUTATION Right 12/24/2017   Procedure: RIGHT 5TH RAY AMPUTATION;  Surgeon: [name], MD;  Location: [facility];  Service: Orthopedics;  Laterality: Right;  . APPENDECTOMY    . BASAL CELL CARCINOMA EXCISION  2/16   left forearm  . CARDIAC CATHETERIZATION  1998   Negative  . CATARACT EXTRACTION Right 2017   then lid surgery  .  CORONARY ARTERY BYPASS GRAFT  09/2005   Post op AFIB  . EYE SURGERY    . FOOT BONE EXCISION Left 11/03/2013   DR Treysen Sudbeck   . I&D EXTREMITY Left 11/03/2013   Procedure: Left Foot Partial Bone Excision Cuboid and Medial Cuneiform, Wound Closures;  Surgeon: [name], MD;  Location: [facility];  Service: Orthopedics;  Laterality: Left;  . INSERTION PROSTATE RADIATION SEED  2009   RT and seeds for prostate cancer  . KIDNEY STONE SURGERY  04/1993  . RCA stents  04/2003   EF 55%  . RETINAL DETACHMENT SURGERY  2002-2003  . thrombosed vein  1993   Right leg   Social History   Occupational History  . Occupation: Heavy equipment mechanic--retired  Tobacco Use  . Smoking status: Former Smoker    Packs/day: 2.00    Years: 35.00    Pack years: 70.00    Types: Cigars, Cigarettes    Quit date: 01/07/2001    Years since quitting: 17.9  . Smokeless tobacco: Never Used  Substance and Sexual Activity  . Alcohol use: Not Currently    Comment: rare  . Drug use: No  . Sexual activity: Yes    Partners: Female

## 2018-12-09 ENCOUNTER — Other Ambulatory Visit (HOSPITAL_COMMUNITY)
Admission: RE | Admit: 2018-12-09 | Discharge: 2018-12-09 | Disposition: A | Discharge status: Home / Self Care | Payer: Medicare Other | Source: Ambulatory Visit | Attending: Orthopedic Surgery | Admitting: Orthopedic Surgery

## 2018-12-09 ENCOUNTER — Other Ambulatory Visit: Payer: Self-pay

## 2018-12-09 ENCOUNTER — Encounter (HOSPITAL_COMMUNITY): Payer: Self-pay | Admitting: *Deleted

## 2018-12-09 DIAGNOSIS — Z20828 Contact with and (suspected) exposure to other viral communicable diseases: Secondary | ICD-10-CM | POA: Insufficient documentation

## 2018-12-09 DIAGNOSIS — Z01812 Encounter for preprocedural laboratory examination: Secondary | ICD-10-CM | POA: Insufficient documentation

## 2018-12-09 LAB — SARS CORONAVIRUS 2 (TAT 6-24 HRS): SARS Coronavirus 2: NEGATIVE

## 2018-12-09 NOTE — Progress Notes (Signed)
Patient denies shortness of breath, fever, cough and chest pain.  PCP - Dr Docia Chuck Cardiologist - denies Diabetes - Dr Benjiman Core Urology - Dr Alinda Money  Chest x-ray - denies EKG - 12/23/17 Stress Test - 09/10/05 ECHO - denies Cardiac Cath - denies  Sleep Study - Yes CPAP - Uses CPAP nightly  Fasting Blood Sugar - 160-220  Checks Blood Sugar 2-3 x week  . Do not take oral diabetes medicines (Metformin) the morning of surgery.  . THE NIGHT BEFORE SURGERY, take 30 Units Toujeo insulin.    . If your CBG is greater than 220 mg/dL, you may take  of your sliding scale (correction) dose of Novolog flexpen insulin.  . If your blood sugar is less than 70 mg/dL, you will need to treat for low blood sugar: o Do not take insulin. o Treat a low blood sugar (less than 70 mg/dL) with  cup of clear juice (cranberry or apple) o Recheck blood sugar in 15 minutes after treatment (to make sure it is greater than 70 mg/dL). If your blood sugar is not greater than 70 mg/dL on recheck, call [phone] for further instructions.  Blood Thinner Instructions:  Follow your surgeon's instructions on when to stop prior to surgery. Trental last dose on.  Aspirin Instructions: Follow your surgeon's instructions on when to stop aspirin prior to surgery,  If no instructions were given by your surgeon then you will need to call the office for those instructions.  ERAS: Clears til 4:30 am DOS, no drink.  Anesthesia review: Yes  STOP now taking any Aspirin (unless otherwise instructed by your surgeon), Aleve, Naproxen, Ibuprofen, Motrin, Advil, Goody's, BC's, all herbal medications, fish oil, and all vitamins.   Coronavirus Screening Have you experienced the following symptoms:  Cough yes/no: No Fever (>100.82F)  yes/no: No Runny nose yes/no: No Sore throat yes/no: No Difficulty breathing/shortness of breath  yes/no: No  Have you traveled in the last 14 days and where? yes/no: No  Patient  verbalized understanding of instructions that were given via phone.

## 2018-12-10 ENCOUNTER — Telehealth: Payer: Self-pay | Admitting: Orthopedic Surgery

## 2018-12-10 MED ORDER — DEXTROSE 5 % IV SOLN
3.0000 g | INTRAVENOUS | Status: AC
  Administered 2018-12-11: 08:00:00 3 g via INTRAVENOUS
  Filled 2018-12-10: qty 3
  Filled 2018-12-10: qty 3000

## 2018-12-10 NOTE — Telephone Encounter (Signed)
[name] please advise, thank you.

## 2018-12-10 NOTE — Anesthesia Preprocedure Evaluation (Addendum)
Anesthesia Evaluation  Patient identified by MRN, date of birth, ID band Patient awake    Reviewed: Allergy & Precautions, NPO status , Patient's Chart, lab work & pertinent test results  History of Anesthesia Complications Negative for: history of anesthetic complications  Airway Mallampati: III  TM Distance: >3 FB Neck ROM: Full    Dental  (+) Teeth Intact   Pulmonary sleep apnea , former smoker,    Pulmonary exam normal        Cardiovascular hypertension, + CAD, + CABG (2007) and + Peripheral Vascular Disease  Normal cardiovascular exam     Neuro/Psych negative neurological ROS  negative psych ROS   GI/Hepatic negative GI ROS, Neg liver ROS,   Endo/Other  diabetes, Type 2Morbid obesity  Renal/GU Renal InsufficiencyRenal disease  negative genitourinary   Musculoskeletal negative musculoskeletal ROS (+)   Abdominal   Peds  Hematology negative hematology ROS (+)   Anesthesia Other Findings   Reproductive/Obstetrics                            Anesthesia Physical Anesthesia Plan  ASA: III  Anesthesia Plan: General   Post-op Pain Management:    Induction: Intravenous  PONV Risk Score and Plan: 2 and Ondansetron, Dexamethasone, Treatment may vary due to age or medical condition and Midazolam  Airway Management Planned: LMA  Additional Equipment: None  Intra-op Plan:   Post-operative Plan: Extubation in OR  Informed Consent: I have reviewed the patients History and Physical, chart, labs and discussed the procedure including the risks, benefits and alternatives for the proposed anesthesia with the patient or authorized representative who has indicated his/her understanding and acceptance.     Dental advisory given  Plan Discussed with:   Anesthesia Plan Comments:        Anesthesia Quick Evaluation

## 2018-12-10 NOTE — Telephone Encounter (Signed)
Patient left a voicemail message stating that he is having surgery tomorrow and wanted to know if he needs to stop taking his aspirin and some other medication (I did not understand what that one was).  CB#[phone].  Thank you.

## 2018-12-11 ENCOUNTER — Inpatient Hospital Stay (HOSPITAL_COMMUNITY): Payer: Medicare Other | Admitting: Anesthesiology

## 2018-12-11 ENCOUNTER — Encounter (HOSPITAL_COMMUNITY)
Admission: RE | Disposition: A | Discharge status: Rehabilitation Facility | Payer: Self-pay | Source: Home / Self Care | Attending: Orthopedic Surgery

## 2018-12-11 ENCOUNTER — Encounter (HOSPITAL_COMMUNITY): Payer: Self-pay | Admitting: Surgery

## 2018-12-11 ENCOUNTER — Other Ambulatory Visit: Payer: Self-pay

## 2018-12-11 ENCOUNTER — Inpatient Hospital Stay (HOSPITAL_COMMUNITY)
Admission: RE | Admit: 2018-12-11 | Discharge: 2018-12-14 | DRG: 240 | Disposition: A | Discharge status: Rehabilitation Facility | Payer: Medicare Other | Attending: Orthopedic Surgery | Admitting: Orthopedic Surgery

## 2018-12-11 DIAGNOSIS — Z8546 Personal history of malignant neoplasm of prostate: Secondary | ICD-10-CM | POA: Diagnosis not present

## 2018-12-11 DIAGNOSIS — E1151 Type 2 diabetes mellitus with diabetic peripheral angiopathy without gangrene: Secondary | ICD-10-CM | POA: Diagnosis present

## 2018-12-11 DIAGNOSIS — Z888 Allergy status to other drugs, medicaments and biological substances status: Secondary | ICD-10-CM | POA: Diagnosis not present

## 2018-12-11 DIAGNOSIS — Z801 Family history of malignant neoplasm of trachea, bronchus and lung: Secondary | ICD-10-CM

## 2018-12-11 DIAGNOSIS — I2510 Atherosclerotic heart disease of native coronary artery without angina pectoris: Secondary | ICD-10-CM | POA: Diagnosis not present

## 2018-12-11 DIAGNOSIS — E1141 Type 2 diabetes mellitus with diabetic mononeuropathy: Secondary | ICD-10-CM | POA: Diagnosis present

## 2018-12-11 DIAGNOSIS — E785 Hyperlipidemia, unspecified: Secondary | ICD-10-CM | POA: Diagnosis present

## 2018-12-11 DIAGNOSIS — Z8719 Personal history of other diseases of the digestive system: Secondary | ICD-10-CM

## 2018-12-11 DIAGNOSIS — Z23 Encounter for immunization: Secondary | ICD-10-CM

## 2018-12-11 DIAGNOSIS — I10 Essential (primary) hypertension: Secondary | ICD-10-CM | POA: Diagnosis present

## 2018-12-11 DIAGNOSIS — L97519 Non-pressure chronic ulcer of other part of right foot with unspecified severity: Secondary | ICD-10-CM | POA: Diagnosis not present

## 2018-12-11 DIAGNOSIS — Z79899 Other long term (current) drug therapy: Secondary | ICD-10-CM

## 2018-12-11 DIAGNOSIS — I87331 Chronic venous hypertension (idiopathic) with ulcer and inflammation of right lower extremity: Secondary | ICD-10-CM | POA: Diagnosis present

## 2018-12-11 DIAGNOSIS — I739 Peripheral vascular disease, unspecified: Secondary | ICD-10-CM | POA: Diagnosis not present

## 2018-12-11 DIAGNOSIS — E1169 Type 2 diabetes mellitus with other specified complication: Secondary | ICD-10-CM | POA: Diagnosis not present

## 2018-12-11 DIAGNOSIS — Z794 Long term (current) use of insulin: Secondary | ICD-10-CM

## 2018-12-11 DIAGNOSIS — Z8249 Family history of ischemic heart disease and other diseases of the circulatory system: Secondary | ICD-10-CM

## 2018-12-11 DIAGNOSIS — E11622 Type 2 diabetes mellitus with other skin ulcer: Secondary | ICD-10-CM | POA: Diagnosis present

## 2018-12-11 DIAGNOSIS — Z6833 Body mass index (BMI) 33.0-33.9, adult: Secondary | ICD-10-CM | POA: Diagnosis not present

## 2018-12-11 DIAGNOSIS — M1990 Unspecified osteoarthritis, unspecified site: Secondary | ICD-10-CM | POA: Diagnosis present

## 2018-12-11 DIAGNOSIS — Z89511 Acquired absence of right leg below knee: Secondary | ICD-10-CM | POA: Diagnosis not present

## 2018-12-11 DIAGNOSIS — Z951 Presence of aortocoronary bypass graft: Secondary | ICD-10-CM | POA: Diagnosis not present

## 2018-12-11 DIAGNOSIS — S78111A Complete traumatic amputation at level between right hip and knee, initial encounter: Secondary | ICD-10-CM | POA: Diagnosis not present

## 2018-12-11 DIAGNOSIS — I87309 Chronic venous hypertension (idiopathic) without complications of unspecified lower extremity: Secondary | ICD-10-CM | POA: Diagnosis present

## 2018-12-11 DIAGNOSIS — E1152 Type 2 diabetes mellitus with diabetic peripheral angiopathy with gangrene: Secondary | ICD-10-CM | POA: Diagnosis not present

## 2018-12-11 DIAGNOSIS — L97212 Non-pressure chronic ulcer of right calf with fat layer exposed: Secondary | ICD-10-CM | POA: Diagnosis present

## 2018-12-11 DIAGNOSIS — I872 Venous insufficiency (chronic) (peripheral): Secondary | ICD-10-CM | POA: Diagnosis not present

## 2018-12-11 DIAGNOSIS — E669 Obesity, unspecified: Secondary | ICD-10-CM | POA: Diagnosis not present

## 2018-12-11 DIAGNOSIS — Z87891 Personal history of nicotine dependence: Secondary | ICD-10-CM | POA: Diagnosis not present

## 2018-12-11 DIAGNOSIS — Z20828 Contact with and (suspected) exposure to other viral communicable diseases: Secondary | ICD-10-CM | POA: Diagnosis not present

## 2018-12-11 DIAGNOSIS — D62 Acute posthemorrhagic anemia: Secondary | ICD-10-CM | POA: Diagnosis not present

## 2018-12-11 DIAGNOSIS — Z89512 Acquired absence of left leg below knee: Secondary | ICD-10-CM | POA: Diagnosis not present

## 2018-12-11 DIAGNOSIS — Z7982 Long term (current) use of aspirin: Secondary | ICD-10-CM | POA: Diagnosis not present

## 2018-12-11 DIAGNOSIS — Z87442 Personal history of urinary calculi: Secondary | ICD-10-CM

## 2018-12-11 DIAGNOSIS — I70261 Atherosclerosis of native arteries of extremities with gangrene, right leg: Secondary | ICD-10-CM | POA: Diagnosis present

## 2018-12-11 DIAGNOSIS — G4733 Obstructive sleep apnea (adult) (pediatric): Secondary | ICD-10-CM | POA: Diagnosis present

## 2018-12-11 DIAGNOSIS — E1149 Type 2 diabetes mellitus with other diabetic neurological complication: Secondary | ICD-10-CM | POA: Diagnosis present

## 2018-12-11 DIAGNOSIS — Z4781 Encounter for orthopedic aftercare following surgical amputation: Secondary | ICD-10-CM | POA: Diagnosis present

## 2018-12-11 DIAGNOSIS — E1142 Type 2 diabetes mellitus with diabetic polyneuropathy: Secondary | ICD-10-CM | POA: Diagnosis present

## 2018-12-11 DIAGNOSIS — Z820 Family history of epilepsy and other diseases of the nervous system: Secondary | ICD-10-CM

## 2018-12-11 DIAGNOSIS — E11621 Type 2 diabetes mellitus with foot ulcer: Secondary | ICD-10-CM | POA: Diagnosis present

## 2018-12-11 DIAGNOSIS — Z85828 Personal history of other malignant neoplasm of skin: Secondary | ICD-10-CM | POA: Diagnosis not present

## 2018-12-11 DIAGNOSIS — Z89612 Acquired absence of left leg above knee: Secondary | ICD-10-CM

## 2018-12-11 DIAGNOSIS — K5900 Constipation, unspecified: Secondary | ICD-10-CM | POA: Diagnosis present

## 2018-12-11 DIAGNOSIS — Z89611 Acquired absence of right leg above knee: Secondary | ICD-10-CM | POA: Diagnosis not present

## 2018-12-11 DIAGNOSIS — Z923 Personal history of irradiation: Secondary | ICD-10-CM | POA: Diagnosis not present

## 2018-12-11 HISTORY — DX: Peripheral vascular disease, unspecified: I73.9

## 2018-12-11 HISTORY — PX: AMPUTATION: SHX166

## 2018-12-11 LAB — CBC
HCT: 40.6 % (ref 39.0–52.0)
Hemoglobin: 13.3 g/dL (ref 13.0–17.0)
MCH: 27.2 pg (ref 26.0–34.0)
MCHC: 32.8 g/dL (ref 30.0–36.0)
MCV: 83 fL (ref 80.0–100.0)
Platelets: 294 10*3/uL (ref 150–400)
RBC: 4.89 MIL/uL (ref 4.22–5.81)
RDW: 16.3 % — ABNORMAL HIGH (ref 11.5–15.5)
WBC: 13.3 10*3/uL — ABNORMAL HIGH (ref 4.0–10.5)
nRBC: 0 % (ref 0.0–0.2)

## 2018-12-11 LAB — BASIC METABOLIC PANEL
Anion gap: 10 (ref 5–15)
BUN: 24 mg/dL — ABNORMAL HIGH (ref 8–23)
CO2: 23 mmol/L (ref 22–32)
Calcium: 8.9 mg/dL (ref 8.9–10.3)
Chloride: 103 mmol/L (ref 98–111)
Creatinine, Ser: 1.08 mg/dL (ref 0.61–1.24)
GFR calc Af Amer: >60 mL/min (ref >60)
GFR calc non Af Amer: >60 mL/min (ref >60)
Glucose, Bld: 197 mg/dL — ABNORMAL HIGH (ref 70–99)
Potassium: 4.3 mmol/L (ref 3.5–5.1)
Sodium: 136 mmol/L (ref 135–145)

## 2018-12-11 LAB — GLUCOSE, CAPILLARY
Glucose-Capillary: 190 mg/dL — ABNORMAL HIGH (ref 70–99)
Glucose-Capillary: 186 mg/dL — ABNORMAL HIGH (ref 70–99)
Glucose-Capillary: 265 mg/dL — ABNORMAL HIGH (ref 70–99)
Glucose-Capillary: 280 mg/dL — ABNORMAL HIGH (ref 70–99)
Glucose-Capillary: 326 mg/dL — ABNORMAL HIGH (ref 70–99)

## 2018-12-11 LAB — HEMOGLOBIN A1C
Hgb A1c MFr Bld: 8.6 % — ABNORMAL HIGH (ref 4.8–5.6)
Mean Plasma Glucose: 200.12 mg/dL

## 2018-12-11 SURGERY — AMPUTATION, ABOVE KNEE
Anesthesia: General | Site: Knee | Laterality: Right

## 2018-12-11 MED ORDER — PROPOFOL 10 MG/ML IV BOLUS
INTRAVENOUS | Status: DC | PRN
  Administered 2018-12-11: 140 mg via INTRAVENOUS

## 2018-12-11 MED ORDER — LACTATED RINGERS IV SOLN
INTRAVENOUS | Status: DC | PRN
  Administered 2018-12-11 (×2): via INTRAVENOUS

## 2018-12-11 MED ORDER — PROPOFOL 10 MG/ML IV BOLUS
INTRAVENOUS | Status: AC
  Filled 2018-12-11: qty 40

## 2018-12-11 MED ORDER — SODIUM CHLORIDE 0.9 % IV SOLN
INTRAVENOUS | Status: DC
  Administered 2018-12-11: 11:00:00 via INTRAVENOUS

## 2018-12-11 MED ORDER — DOCUSATE SODIUM 100 MG PO CAPS
100.0000 mg | ORAL_CAPSULE | Freq: Two times a day (BID) | ORAL | Status: DC
  Administered 2018-12-11 – 2018-12-14 (×7): 100 mg via ORAL
  Filled 2018-12-11 (×7): qty 1

## 2018-12-11 MED ORDER — MIDAZOLAM HCL 2 MG/2ML IJ SOLN
INTRAMUSCULAR | Status: AC
  Filled 2018-12-11: qty 2

## 2018-12-11 MED ORDER — FENTANYL CITRATE (PF) 250 MCG/5ML IJ SOLN
INTRAMUSCULAR | Status: AC
  Filled 2018-12-11: qty 5

## 2018-12-11 MED ORDER — PHENYLEPHRINE 40 MCG/ML (10ML) SYRINGE FOR IV PUSH (FOR BLOOD PRESSURE SUPPORT)
PREFILLED_SYRINGE | INTRAVENOUS | Status: DC | PRN
  Administered 2018-12-11: 80 ug via INTRAVENOUS

## 2018-12-11 MED ORDER — ASPIRIN 325 MG PO TABS
325.0000 mg | ORAL_TABLET | Freq: Every day | ORAL | Status: DC
  Administered 2018-12-11 – 2018-12-14 (×4): 325 mg via ORAL
  Filled 2018-12-11 (×4): qty 1

## 2018-12-11 MED ORDER — HYDROCHLOROTHIAZIDE 12.5 MG PO CAPS
12.5000 mg | ORAL_CAPSULE | Freq: Every day | ORAL | Status: DC
  Administered 2018-12-11 – 2018-12-14 (×4): 12.5 mg via ORAL
  Filled 2018-12-11 (×4): qty 1

## 2018-12-11 MED ORDER — METOCLOPRAMIDE HCL 5 MG/ML IJ SOLN: 5.0000 mg | Freq: Three times a day (TID) | INTRAMUSCULAR | Status: DC | PRN

## 2018-12-11 MED ORDER — METFORMIN HCL 500 MG PO TABS
1000.0000 mg | ORAL_TABLET | Freq: Every day | ORAL | Status: DC
  Administered 2018-12-11 – 2018-12-14 (×4): 1000 mg via ORAL
  Filled 2018-12-11 (×5): qty 2

## 2018-12-11 MED ORDER — OXYCODONE HCL 5 MG PO TABS
5.0000 mg | ORAL_TABLET | Freq: Once | ORAL | Status: AC | PRN
  Administered 2018-12-11: 5 mg via ORAL

## 2018-12-11 MED ORDER — ACETAMINOPHEN 325 MG PO TABS: 325.0000 mg | ORAL_TABLET | Freq: Four times a day (QID) | ORAL | Status: DC | PRN

## 2018-12-11 MED ORDER — LIDOCAINE 2% (20 MG/ML) 5 ML SYRINGE
INTRAMUSCULAR | Status: DC | PRN
  Administered 2018-12-11: 80 mg via INTRAVENOUS

## 2018-12-11 MED ORDER — METOCLOPRAMIDE HCL 5 MG PO TABS: 5.0000 mg | ORAL_TABLET | Freq: Three times a day (TID) | ORAL | Status: DC | PRN

## 2018-12-11 MED ORDER — FENTANYL CITRATE (PF) 100 MCG/2ML IJ SOLN
INTRAMUSCULAR | Status: DC | PRN
  Administered 2018-12-11 (×3): 50 ug via INTRAVENOUS
  Administered 2018-12-11: 100 ug via INTRAVENOUS

## 2018-12-11 MED ORDER — INSULIN ASPART 100 UNIT/ML ~~LOC~~ SOLN
0.0000 [IU] | Freq: Three times a day (TID) | SUBCUTANEOUS | Status: DC
  Administered 2018-12-11 – 2018-12-12 (×4): 8 [IU] via SUBCUTANEOUS
  Administered 2018-12-12: 15 [IU] via SUBCUTANEOUS
  Administered 2018-12-13: 2 [IU] via SUBCUTANEOUS
  Administered 2018-12-13 (×2): 5 [IU] via SUBCUTANEOUS
  Administered 2018-12-14: 3 [IU] via SUBCUTANEOUS
  Administered 2018-12-14: 11 [IU] via SUBCUTANEOUS

## 2018-12-11 MED ORDER — ONDANSETRON HCL 4 MG PO TABS: 4.0000 mg | ORAL_TABLET | Freq: Four times a day (QID) | ORAL | Status: DC | PRN

## 2018-12-11 MED ORDER — LOSARTAN POTASSIUM 50 MG PO TABS
50.0000 mg | ORAL_TABLET | Freq: Every day | ORAL | Status: DC
  Administered 2018-12-11 – 2018-12-14 (×4): 50 mg via ORAL
  Filled 2018-12-11 (×4): qty 1

## 2018-12-11 MED ORDER — ONDANSETRON HCL 4 MG/2ML IJ SOLN: 4.0000 mg | Freq: Four times a day (QID) | INTRAMUSCULAR | Status: DC | PRN

## 2018-12-11 MED ORDER — OXYCODONE HCL 5 MG PO TABS
5.0000 mg | ORAL_TABLET | ORAL | Status: DC | PRN
  Administered 2018-12-11 – 2018-12-14 (×5): 10 mg via ORAL
  Filled 2018-12-11 (×5): qty 2

## 2018-12-11 MED ORDER — LIDOCAINE 2% (20 MG/ML) 5 ML SYRINGE
INTRAMUSCULAR | Status: AC
  Filled 2018-12-11: qty 5

## 2018-12-11 MED ORDER — POTASSIUM CITRATE ER 10 MEQ (1080 MG) PO TBCR
20.0000 meq | EXTENDED_RELEASE_TABLET | Freq: Two times a day (BID) | ORAL | Status: DC
  Administered 2018-12-11 – 2018-12-14 (×7): 20 meq via ORAL
  Filled 2018-12-11 (×8): qty 2

## 2018-12-11 MED ORDER — OXYCODONE HCL 5 MG PO TABS
ORAL_TABLET | ORAL | Status: AC
  Administered 2018-12-11: 10 mg via ORAL
  Filled 2018-12-11: qty 1

## 2018-12-11 MED ORDER — OXYCODONE HCL 5 MG/5ML PO SOLN: 5.0000 mg | Freq: Once | ORAL | Status: AC | PRN

## 2018-12-11 MED ORDER — CEFAZOLIN SODIUM-DEXTROSE 2-4 GM/100ML-% IV SOLN
2.0000 g | Freq: Four times a day (QID) | INTRAVENOUS | Status: AC
  Administered 2018-12-11 – 2018-12-12 (×3): 2 g via INTRAVENOUS
  Filled 2018-12-11 (×3): qty 100

## 2018-12-11 MED ORDER — LOSARTAN POTASSIUM-HCTZ 50-12.5 MG PO TABS: 1.0000 | ORAL_TABLET | Freq: Every day | ORAL | Status: DC

## 2018-12-11 MED ORDER — CARVEDILOL 25 MG PO TABS
25.0000 mg | ORAL_TABLET | Freq: Two times a day (BID) | ORAL | Status: DC
  Administered 2018-12-11 – 2018-12-14 (×6): 25 mg via ORAL
  Filled 2018-12-11 (×6): qty 1

## 2018-12-11 MED ORDER — HYDROMORPHONE HCL 1 MG/ML IJ SOLN
0.5000 mg | INTRAMUSCULAR | Status: DC | PRN
  Administered 2018-12-11 – 2018-12-12 (×2): 0.5 mg via INTRAVENOUS
  Filled 2018-12-11 (×2): qty 1

## 2018-12-11 MED ORDER — FENTANYL CITRATE (PF) 100 MCG/2ML IJ SOLN
INTRAMUSCULAR | Status: AC
  Filled 2018-12-11: qty 2

## 2018-12-11 MED ORDER — ONDANSETRON HCL 4 MG/2ML IJ SOLN: 4.0000 mg | Freq: Once | INTRAMUSCULAR | Status: DC | PRN

## 2018-12-11 MED ORDER — CHLORHEXIDINE GLUCONATE 4 % EX LIQD: 60.0000 mL | Freq: Once | CUTANEOUS | Status: DC

## 2018-12-11 MED ORDER — FENTANYL CITRATE (PF) 100 MCG/2ML IJ SOLN
25.0000 ug | INTRAMUSCULAR | Status: DC | PRN
  Administered 2018-12-11: 25 ug via INTRAVENOUS
  Administered 2018-12-11: 50 ug via INTRAVENOUS
  Administered 2018-12-11: 25 ug via INTRAVENOUS

## 2018-12-11 MED ORDER — INSULIN GLARGINE 100 UNIT/ML ~~LOC~~ SOLN
40.0000 [IU] | Freq: Every day | SUBCUTANEOUS | Status: DC
  Administered 2018-12-11 – 2018-12-13 (×3): 40 [IU] via SUBCUTANEOUS
  Filled 2018-12-11 (×4): qty 0.4

## 2018-12-11 SURGICAL SUPPLY — 41 items
BLADE SAW RECIP 87.9 MT (BLADE) ×2 IMPLANT
BLADE SURG 21 STRL SS (BLADE) ×2 IMPLANT
BNDG COHESIVE 6X5 TAN STRL LF (GAUZE/BANDAGES/DRESSINGS) ×2 IMPLANT
CANISTER WOUND CARE 500ML ATS (WOUND CARE) IMPLANT
COVER SURGICAL LIGHT HANDLE (MISCELLANEOUS) ×2 IMPLANT
COVER WAND RF STERILE (DRAPES) IMPLANT
CUFF TOURN SGL QUICK 34 (TOURNIQUET CUFF)
CUFF TRNQT CYL 34X4.125X (TOURNIQUET CUFF) IMPLANT
DRAPE INCISE IOBAN 66X45 STRL (DRAPES) ×4 IMPLANT
DRAPE U-SHAPE 47X51 STRL (DRAPES) ×2 IMPLANT
DRESSING PREVENA PLUS CUSTOM (GAUZE/BANDAGES/DRESSINGS) ×2 IMPLANT
DRSG PREVENA PLUS CUSTOM (GAUZE/BANDAGES/DRESSINGS) ×4
DURAPREP 26ML APPLICATOR (WOUND CARE) ×2 IMPLANT
ELECT REM PT RETURN 9FT ADLT (ELECTROSURGICAL) ×2
ELECTRODE REM PT RTRN 9FT ADLT (ELECTROSURGICAL) ×1 IMPLANT
GLOVE BIOGEL PI IND STRL 7.5 (GLOVE) ×1 IMPLANT
GLOVE BIOGEL PI IND STRL 9 (GLOVE) ×1 IMPLANT
GLOVE BIOGEL PI INDICATOR 7.5 (GLOVE) ×1
GLOVE BIOGEL PI INDICATOR 9 (GLOVE) ×1
GLOVE SURG ORTHO 9.0 STRL STRW (GLOVE) ×2 IMPLANT
GLOVE SURG SS PI 6.5 STRL IVOR (GLOVE) ×2 IMPLANT
GOWN STRL REUS W/ TWL LRG LVL3 (GOWN DISPOSABLE) ×1 IMPLANT
GOWN STRL REUS W/ TWL XL LVL3 (GOWN DISPOSABLE) ×2 IMPLANT
GOWN STRL REUS W/TWL LRG LVL3 (GOWN DISPOSABLE) ×1
GOWN STRL REUS W/TWL XL LVL3 (GOWN DISPOSABLE) ×2
KIT BASIN OR (CUSTOM PROCEDURE TRAY) ×2 IMPLANT
KIT TURNOVER KIT B (KITS) ×2 IMPLANT
MANIFOLD NEPTUNE II (INSTRUMENTS) IMPLANT
NS IRRIG 1000ML POUR BTL (IV SOLUTION) ×2 IMPLANT
PACK ORTHO EXTREMITY (CUSTOM PROCEDURE TRAY) ×2 IMPLANT
PAD ARMBOARD 7.5X6 YLW CONV (MISCELLANEOUS) ×2 IMPLANT
PREVENA RESTOR ARTHOFORM 46X30 (CANNISTER) ×2 IMPLANT
STAPLER VISISTAT 35W (STAPLE) IMPLANT
STOCKINETTE IMPERVIOUS LG (DRAPES) IMPLANT
SUT ETHILON 2 0 PSLX (SUTURE) ×4 IMPLANT
SUT SILK 2 0 (SUTURE) ×1
SUT SILK 2-0 18XBRD TIE 12 (SUTURE) ×1 IMPLANT
SUT VIC AB 2-0 CT1 36 (SUTURE) ×2 IMPLANT
TOWEL GREEN STERILE FF (TOWEL DISPOSABLE) ×2 IMPLANT
TUBE CONNECTING 20X1/4 (TUBING) ×2 IMPLANT
YANKAUER SUCT BULB TIP NO VENT (SUCTIONS) ×2 IMPLANT

## 2018-12-11 NOTE — Op Note (Signed)
12/11/2018  8:23 AM  PATIENT:  [name]    PRE-OPERATIVE DIAGNOSIS:  Gangrene Right Leg  POST-OPERATIVE DIAGNOSIS:  Same  PROCEDURE:  RIGHT ABOVE KNEE AMPUTATION  SURGEON:  [name], MD  PHYSICIAN ASSISTANT:None ANESTHESIA:   General  PREOPERATIVE INDICATIONS:  [name] is a  68 y.o. male with a diagnosis of Gangrene Right Leg who failed conservative measures and elected for surgical management.    The risks benefits and alternatives were discussed with the patient preoperatively including but not limited to the risks of infection, bleeding, nerve injury, cardiopulmonary complications, the need for revision surgery, among others, and the patient was willing to proceed.  OPERATIVE IMPLANTS: Praveena customizable and arthroform wound VAC  @ENCIMAGES @  OPERATIVE FINDINGS: Good contractile bleeding muscle  OPERATIVE PROCEDURE: Patient was brought the operating room and underwent a general anesthetic.  After adequate levels anesthesia were obtained patient's necrotic right lower extremity was draped out of the sterile field with impervious stockinette in the right lower extremity was prepped using DuraPrep draped into a sterile field a timeout was called.  A fishmouth incision was made through the distal thigh blunt dissection was carried down to the medial neurovascular bundle.  This was clamped and suture ligated with 2-0 silk.  The remainder the amputation was completed the bone was resected with a reciprocating saw.  Hemostasis was obtained the deep and superficial fascia layers were closed using #2 Vicryl the skin was closed using 2-0 nylon and staples.  A Praveena customizable and arthroform wound VAC was applied this had a good suction fit patient was extubated taken the PACU in stable condition.   DISCHARGE PLANNING:  Antibiotic duration: 24 hours  Weightbearing: Transfer training  Pain medication: Opioid pathway  Dressing care/ Wound VAC: Continue wound VAC for  1 week  Ambulatory devices: Transfer board  Discharge to: Home.  Follow-up: In the office 1 week post operative.

## 2018-12-11 NOTE — Anesthesia Procedure Notes (Signed)
Procedure Name: LMA Insertion Date/Time: 12/11/2018 7:45 AM Performed by: Gwyndolyn Saxon, CRNA Pre-anesthesia Checklist: Patient identified, Emergency Drugs available, Suction available and Patient being monitored Patient Re-evaluated:Patient Re-evaluated prior to induction Oxygen Delivery Method: Circle System Utilized Preoxygenation: Pre-oxygenation with 100% oxygen Induction Type: IV induction Ventilation: Mask ventilation without difficulty LMA: LMA inserted LMA Size: 5.0 Number of attempts: 1 Airway Equipment and Method: Bite block Placement Confirmation: positive ETCO2,  CO2 detector and breath sounds checked- equal and bilateral Tube secured with: Tape Dental Injury: Teeth and Oropharynx as per pre-operative assessment

## 2018-12-11 NOTE — Transfer of Care (Signed)
Immediate Anesthesia Transfer of Care Note  Patient: [name]  Procedure(s) Performed: RIGHT ABOVE KNEE AMPUTATION (Right Knee)  Patient Location: PACU  Anesthesia Type:General  Level of Consciousness: awake, alert  and patient cooperative  Airway & Oxygen Therapy: Patient Spontanous Breathing and Patient connected to nasal cannula oxygen  Post-op Assessment: Report given to RN and Post -op Vital signs reviewed and stable  Post vital signs: Reviewed and stable  Last Vitals:  Vitals Value Taken Time  BP 121/65 12/11/18 0840  Temp 37.2 C 12/11/18 0825  Pulse 87 12/11/18 0851  Resp 24 12/11/18 0851  SpO2 95 % 12/11/18 0851  Vitals shown include unvalidated device data.  Last Pain:  Vitals:   12/11/18 0850  PainSc: Asleep      Patients Stated Pain Goal: 3 ([id number])  Complications: No apparent anesthesia complications

## 2018-12-11 NOTE — Care Management (Signed)
CM consult acknowledged to assist with any HH/DME needs. Awaiting PT/OT eval for DCP recommendations and will continue to follow.  Cyler Kappes MSN, RN, NCM-BC, ACM-RN [phone] 

## 2018-12-11 NOTE — Telephone Encounter (Signed)
Doesn't make a difference at this point

## 2018-12-11 NOTE — Evaluation (Signed)
Physical Therapy Evaluation Patient Details Name: [name] MRN: [id number] DOB: January 05, 1951 Today's Date: 12/11/2018   History of Present Illness  Pt is a 68 y/o male s/p R transfemoral amputation with wound VAC application. PMH including but not limited to L transfemoral amputation in 2019, DM and PVD.    Clinical Impression  Pt presented supine in bed with HOB elevated, awake and willing to participate in therapy session. Prior to admission, pt reported that he was at a mod I level for transfers with a sliding board and used a w/c for mobility. He lives with his wife in a single level home with a ramped entrance. At the time of evaluation, pt limited secondary to significant pain in the R residual limb. He is eager to make progress and return to his PLOF. Pt would greatly benefit from further intensive therapy services at [facility] to maximize his independence with functional mobility at a mod I w/c level. Will continue to follow acutely to progress mobility as tolerated.     Follow Up Recommendations [facility]    Equipment Recommendations  None recommended by PT    Recommendations for Other Services       Precautions / Restrictions Precautions Precautions: Fall Precaution Comments: wound VAC Restrictions Weight Bearing Restrictions: Yes Other Position/Activity Restrictions: NWB      Mobility  Bed Mobility Overal bed mobility: Needs Assistance Bed Mobility: Supine to Sit;Sit to Supine     Supine to sit: Max assist;HOB elevated Sit to supine: Min guard   General bed mobility comments: assistance needed to achieve partial long sitting in bed; pt with good upper body strength but greatly limited secondary to pain  Transfers                 General transfer comment: unable to attempt at this time secondary to pain  Ambulation/Gait                Stairs            Wheelchair Mobility    Modified Rankin (Stroke Patients Only)       Balance Overall  balance assessment: Needs assistance Sitting-balance support: Bilateral upper extremity supported Sitting balance-Leahy Scale: Poor                                       Pertinent Vitals/Pain Pain Assessment: Faces Faces Pain Scale: Hurts whole lot Pain Location: R residual limb Pain Descriptors / Indicators: Grimacing;Guarding Pain Intervention(s): Monitored during session;Repositioned    Home Living Family/patient expects to be discharged to:: Private residence Living Arrangements: Spouse/significant other Available Help at Discharge: Family;Available 24 hours/day Type of Home: House Home Access: Ramped entrance     Home Layout: One level Home Equipment: Bedside commode;Wheelchair - Education administrator (comment)(sliding board; drop arm BSC)      Prior Function Level of Independence: Independent with assistive device(s)         Comments: at a w/c level; mod I for transfers with sliding board; independent with bathing/dressing     Hand Dominance        Extremity/Trunk Assessment   Upper Extremity Assessment Upper Extremity Assessment: Overall WFL for tasks assessed    Lower Extremity Assessment Lower Extremity Assessment: LLE deficits/detail;RLE deficits/detail RLE Deficits / Details: greatly limited with assessment secondary to significant pain; wound VAC in place to distal aspect of residual limb RLE: Unable to fully assess due to pain  LLE Deficits / Details: prior transfemoral amputation; able to lift against gravity during functional mobility       Communication   Communication: No difficulties  Cognition Arousal/Alertness: Awake/alert Behavior During Therapy: WFL for tasks assessed/performed Overall Cognitive Status: Within Functional Limits for tasks assessed                                        General Comments      Exercises     Assessment/Plan    PT Assessment Patient needs continued PT services  PT Problem List  Decreased strength;Decreased activity tolerance;Decreased range of motion;Decreased balance;Decreased mobility;Decreased coordination;Decreased knowledge of use of DME;Decreased safety awareness;Decreased knowledge of precautions;Pain       PT Treatment Interventions DME instruction;Functional mobility training;Therapeutic activities;Therapeutic exercise;Balance training;Neuromuscular re-education;Patient/family education    PT Goals (Current goals can be found in the Care Plan section)  Acute Rehab PT Goals Patient Stated Goal: to be able to perform transfers by himself and to go home PT Goal Formulation: With patient/family Time For Goal Achievement: 12/25/18 Potential to Achieve Goals: Good    Frequency Min 5X/week   Barriers to discharge        Co-evaluation               AM-PAC PT "6 Clicks" Mobility  Outcome Measure Help needed turning from your back to your side while in a flat bed without using bedrails?: A Lot Help needed moving from lying on your back to sitting on the side of a flat bed without using bedrails?: A Lot Help needed moving to and from a bed to a chair (including a wheelchair)?: Total Help needed standing up from a chair using your arms (e.g., wheelchair or bedside chair)?: Total Help needed to walk in hospital room?: Total Help needed climbing 3-5 steps with a railing? : Total 6 Click Score: 8    End of Session   Activity Tolerance: Patient limited by pain Patient left: in bed;with call bell/phone within reach;with family/visitor present;with bed alarm set Nurse Communication: Mobility status PT Visit Diagnosis: Other abnormalities of gait and mobility (R26.89);Pain Pain - Right/Left: Right Pain - part of body: Leg    Time: 1021-1040 PT Time Calculation (min) (ACUTE ONLY): 19 min   Charges:   PT Evaluation $PT Eval Moderate Complexity: 1 Mod          Eduard Clos, PT, DPT  Acute Rehabilitation Services Pager [phone] Office  [place] 12/11/2018, 2:01 PM

## 2018-12-11 NOTE — H&P (Signed)
[name] is an 68 y.o. male.   Chief Complaint: Right Leg Gangrene HPI: Patient is a 68 year old gentleman who presents in follow-up for ulceration right lower extremity.  Patient has progressive gangrenous changes to the right leg wound he has been undergoing twice a week dressing changes to help with the venous insufficiency and despite the aggressive wound care patient has progressive gangrenous necrotic foul-smelling drainage from the right leg.  Patient did not get his prosthetic limb on the left secondary to COVID-19 he did not want to go outside the home with his medical problems to be fit for prosthesis as well as go to therapy.  Past Medical History:  Diagnosis Date  . Cellulitis and abscess of left lower extremity 12/20/2017  . Coronary atherosclerosis of unspecified type of vessel, native or graft   . Diabetes mellitus without complication ([place])    type 2  . Hemorrhage of rectum and anus   . History of kidney stones    passed stones  . Impotence of organic origin   . Internal hemorrhoids without mention of complication   . Left below-knee amputee ([place]) 02/23/2016  . Malignant neoplasm of prostate ([place])   . Mononeuritis of unspecified site   . Osteoarthrosis, unspecified whether generalized or localized, unspecified site   . Other and unspecified hyperlipidemia   . Peripheral vascular disease ([place])   . Personal history of colonic polyps   . Personal history of diabetic foot ulcer    saw wound center, resolved 05/08/2010  . Personal history of gallstones   . Routine general medical examination at a health care facility   . Subacute osteomyelitis, right ankle and foot ([place])   . Type II or unspecified type diabetes mellitus with neurological manifestations, not stated as uncontrolled(250.60)   . Unspecified glaucoma(365.9)   . Unspecified sleep apnea    cpcp  . Unspecified venous (peripheral) insufficiency     Past Surgical History:  Procedure Laterality Date  .  AMPUTATION Left 12/30/2012   Procedure: AMPUTATION RAY ;  Surgeon: Meredith Pel, MD;  Location: [facility];  Service: Orthopedics;  Laterality: Left;  LEFT GREAT TOE RAY AMPUTATION  . AMPUTATION Left 02/23/2016   Procedure: Left Below Knee Amputation;  Surgeon: [name], MD;  Location: [facility];  Service: Orthopedics;  Laterality: Left;  . AMPUTATION Left 12/24/2017   Procedure: LEFT ABOVE KNEE AMPUTATION;  Surgeon: [name], MD;  Location: [facility];  Service: Orthopedics;  Laterality: Left;  . AMPUTATION Right 12/24/2017   Procedure: RIGHT 5TH RAY AMPUTATION;  Surgeon: [name], MD;  Location: [facility];  Service: Orthopedics;  Laterality: Right;  . APPENDECTOMY    . BASAL CELL CARCINOMA EXCISION  2/16   left forearm  . BELPHAROPTOSIS REPAIR    . CARDIAC CATHETERIZATION  1998   Negative  . CATARACT EXTRACTION Right 2017   then lid surgery  . COLONOSCOPY    . CORONARY ARTERY BYPASS GRAFT  09/2005   5 bypasses per patient, Post op AFIB  . EYE SURGERY    . FOOT BONE EXCISION Left 11/03/2013   DR DUDA   . I&D EXTREMITY Left 11/03/2013   Procedure: Left Foot Partial Bone Excision Cuboid and Medial Cuneiform, Wound Closures;  Surgeon: [name], MD;  Location: [facility];  Service: Orthopedics;  Laterality: Left;  . INSERTION PROSTATE RADIATION SEED  2009   RT and seeds for prostate cancer  . KIDNEY STONE SURGERY  04/1993  . RCA stents  04/2003   EF 55%  . RETINAL DETACHMENT SURGERY  2002-2003  . thrombosed vein  1993   Right leg    Family History  Problem Relation Age of Onset  . Lung cancer Father   . Multiple sclerosis Mother   . Heart attack Other        paternal aunts and uncles  . Peripheral vascular disease Maternal Grandfather        several amputations   Social History:  reports that he quit smoking about 17 years ago. His smoking use included cigars and cigarettes. He has a 70.00 pack-year smoking history. He has never used smokeless tobacco. He reports previous  alcohol use. He reports that he does not use drugs.  Allergies:  Allergies  Allergen Reactions  . Atorvastatin Other (See Comments)    myalgias  . Ezetimibe Other (See Comments)    Body ache  . Simvastatin Other (See Comments)    Body ache    Medications Prior to Admission  Medication Sig Dispense Refill  . ACCU-CHEK GUIDE test strip USE AS INSTRUCTED TO CHECK SUGAR 3 TIMES DAILY DX- E11.42 200 strip 8  . Ascorbic Acid (VITAMIN C) 1000 MG tablet Take 1,000 mg by mouth daily.    Marland Kitchen aspirin 325 MG tablet Take 325 mg by mouth daily.      . carvedilol (COREG) 25 MG tablet TAKE 1 TABLET TWICE DAILY (Patient taking differently: Take 25 mg by mouth 2 (two) times daily. ) 180 tablet 3  . Cholecalciferol (VITAMIN D3) 125 MCG (5000 UT) TABS Take 5,000 Units by mouth daily.    . DROPLET PEN NEEDLES 31G X 5 MM MISC USE TO INJECT INSULIN FOUR TIMES DAILY AS INSTRUCTED (FOR DIABETES) 400 each 1  . HOMEOPATHIC PRODUCTS NA Place 1 spray into the nose 2 (two) times daily as needed (nasal congestion.). Argentyn 23     . insulin aspart (NOVOLOG FLEXPEN) 100 UNIT/ML FlexPen INJECT 16-26 UNITS SUBCUTANEOUSLY THREE TIMES DAILY WITH MEALS (Patient taking differently: Inject 25-30 Units into the skin 3 (three) times daily with meals. Sliding Scale Insulin) 20 pen 3  . Insulin Glargine, 2 Unit Dial, (TOUJEO MAX SOLOSTAR) 300 UNIT/ML SOPN Inject 55 Units into the skin at bedtime. (Patient taking differently: Inject 60 Units into the skin at bedtime. ) 9 pen 3  . losartan-hydrochlorothiazide (HYZAAR) 50-12.5 MG tablet TAKE 1 TABLET EVERY DAY (Patient taking differently: Take 1 tablet by mouth daily. ) 90 tablet 3  . metFORMIN (GLUCOPHAGE) 1000 MG tablet TAKE 1 TABLET EVERY DAY (Patient taking differently: Take 1,000 mg by mouth every evening. ) 90 tablet 2  . Multiple Vitamin (MULTIVITAMIN WITH MINERALS) TABS tablet Take 1 tablet by mouth daily. Centrum Silver for Men 50+    . mupirocin ointment (BACTROBAN) 2 % APPLY  1 APPLICATION TOPICALLY DAILY. 22 g 6  . naproxen sodium (ALEVE) 220 MG tablet Take 440-660 mg by mouth 2 (two) times daily as needed (pain.).    Marland Kitchen nitroGLYCERIN (NITRODUR - DOSED IN MG/24 HR) 0.4 mg/hr patch Place 1 patch (0.4 mg total) onto the skin daily. 30 patch 12  . pentoxifylline (TRENTAL) 400 MG CR tablet Take 1 tablet (400 mg total) by mouth 3 (three) times daily with meals. 90 tablet 11  . potassium citrate (UROCIT-K) 10 MEQ (1080 MG) SR tablet Take 20 mEq by mouth 2 (two) times daily.    . Probiotic Product (PROBIOTIC PO) Take 1 capsule by mouth daily. Probulin Probiotic Supplement    .  Turmeric 500 MG CAPS Take 500 mg by mouth daily.    Marland Kitchen zinc gluconate 50 MG tablet Take 50 mg by mouth 2 (two) times daily.     . rosuvastatin (CRESTOR) 5 MG tablet Take 1 tablet (5 mg total) by mouth once a week. (Patient not taking: Reported on 12/08/2018) 12 tablet 3    Results for orders placed or performed during the hospital encounter of 12/11/18 (from the past 48 hour(s))  Glucose, capillary     Status: Abnormal   Collection Time: 12/11/18  6:24 AM  Result Value Ref Range   Glucose-Capillary 190 (H) 70 - 99 mg/dL   No results found.  Review of Systems  All other systems reviewed and are negative.   Blood pressure (!) 179/69, pulse 92, temperature 98.3 F (36.8 C), resp. rate 20, height 6\' 2"  (1.88 m), weight 117.9 kg, SpO2 100 %. Physical Exam   Patient is alert, oriented, no adenopathy, well-dressed, normal affect, normal respiratory effort. Examination patient has progressive foul-smelling necrotic changes almost the entire right calf.  This extends from the tibial tubercle down to the ankle there is ischemic ulcer over the great toe as well as over the fifth ray amputation with incision primarily healed.  A Doppler was used and patient has a strong dorsalis pedis and posterior tibial pulse which is monophasic.  Patient has good hair growth to the knee.  Assessment/Plan  1.  Idiopathic chronic venous hypertension of right lower extremity with ulcer and inflammation (HCC)   2. Atherosclerosis of native arteries of extremities with gangrene, right leg (HCC)     Plan: Patient does not have limb salvage options for the right lower extremity he has a strong pulse dorsalis pedis and posterior tibial on the right with monophasic flow there is no large vessel insufficiency his problems are microcirculation.  We will plan for a above-the-knee amputation on the right.  Risks and benefits were discussed including risk of the wound not healing need for additional surgery.  Discussed the patient will need rehab either inpatient or outpatient.  Patient will need short transfer legs.  Bevely Palmer Dymin Dingledine, PA 12/11/2018, 6:49 AM

## 2018-12-11 NOTE — Progress Notes (Signed)
Inpatient Rehabilitation Admissions Coordinator  Pt did well with therapy evals. I will follow up Monday for possible admit to CIR Monday.   Danne Baxter, RN, MSN Rehab Admissions Coordinator [phone] 12/11/2018 7:45 PM

## 2018-12-11 NOTE — Progress Notes (Signed)
Rehab Admissions Coordinator Note:  Patient was screened by [name] for appropriateness for an Inpatient Acute Rehab Consult per PT recs.   At this time, we are recommending Inpatient Rehab consult I will place order.  [name] RN MSN 12/11/2018, 3:16 PM  I can be reached at [phone].

## 2018-12-11 NOTE — Anesthesia Postprocedure Evaluation (Signed)
Anesthesia Post Note  Patient: [name]  Procedure(s) Performed: RIGHT ABOVE KNEE AMPUTATION (Right Knee)     Patient location during evaluation: PACU Anesthesia Type: General Level of consciousness: awake and alert Pain management: pain level controlled Vital Signs Assessment: post-procedure vital signs reviewed and stable Respiratory status: spontaneous breathing, nonlabored ventilation and respiratory function stable Cardiovascular status: blood pressure returned to baseline and stable Postop Assessment: no apparent nausea or vomiting Anesthetic complications: no    Last Vitals:  Vitals:   12/11/18 0925 12/11/18 0951  BP: 136/69 137/69  Pulse: 88 87  Resp: 16 18  Temp:  37.1 C  SpO2: 97% 98%    Last Pain:  Vitals:   12/11/18 0951  TempSrc: Oral  PainSc:                  Lidia Collum

## 2018-12-11 NOTE — Evaluation (Signed)
Occupational Therapy Evaluation Patient Details Name: [name] MRN: [id number] DOB: 1950-12-06 Today's Date: 12/11/2018    History of Present Illness Pt is a 68 y/o male s/p R transfemoral amputation with wound VAC application. PMH including but not limited to L transfemoral amputation in 2019, DM and PVD.   Clinical Impression   Pt was functioning modified independently prior to admission. Presents with post operative pain, impaired sitting balance and generalized weakness. Pt requires set up to max assist for ADL. He was performing LB ADL leaning side to side even prior to this admission. Anticipate pt will progress well with intensive rehab. Will follow acutely.    Follow Up Recommendations  CIR    Equipment Recommendations  None recommended by OT    Recommendations for Other Services       Precautions / Restrictions Precautions Precautions: Fall Precaution Comments: wound VAC Restrictions Weight Bearing Restrictions: Yes Other Position/Activity Restrictions: NWB      Mobility Bed Mobility Overal bed mobility: Needs Assistance Bed Mobility: Supine to Sit;Sit to Supine     Supine to sit: Mod assist Sit to supine: Min guard   General bed mobility comments: pt with heavy reliance on UE on rail, assist to raise trunk, asked pt to continue working on supine to sit in bed using B rails  Transfers                 General transfer comment: deferred    Balance Overall balance assessment: Needs assistance Sitting-balance support: Bilateral upper extremity supported Sitting balance-Leahy Scale: Fair Sitting balance - Comments: poor intially, increased to fair with practice                                   ADL either performed or assessed with clinical judgement   ADL Overall ADL's : Needs assistance/impaired Eating/Feeding: Independent   Grooming: Set up;Min guard;Sitting   Upper Body Bathing: Sitting;Minimal assistance   Lower Body  Bathing: Sitting/lateral leans;Moderate assistance   Upper Body Dressing : Sitting;Min guard   Lower Body Dressing: Sitting/lateral leans;Moderate assistance                 General ADL Comments: pt reports performing LB ADL sitting and leaning side to side prior to admission     Vision Baseline Vision/History: Wears glasses Wears Glasses: At all times Patient Visual Report: No change from baseline       Perception     Praxis      Pertinent Vitals/Pain Pain Assessment: Faces Faces Pain Scale: Hurts little more Pain Location: R residual limb Pain Descriptors / Indicators: Operative site guarding Pain Intervention(s): Monitored during session;Repositioned     Hand Dominance Right   Extremity/Trunk Assessment Upper Extremity Assessment Upper Extremity Assessment: Overall WFL for tasks assessed   Lower Extremity Assessment Lower Extremity Assessment: Defer to PT evaluation RLE Deficits / Details: greatly limited with assessment secondary to significant pain; wound VAC in place to distal aspect of residual limb RLE: Unable to fully assess due to pain LLE Deficits / Details: prior transfemoral amputation; able to lift against gravity during functional mobility   Cervical / Trunk Assessment Cervical / Trunk Assessment: Other exceptions Cervical / Trunk Exceptions: obesity   Communication Communication Communication: No difficulties   Cognition Arousal/Alertness: Awake/alert Behavior During Therapy: WFL for tasks assessed/performed Overall Cognitive Status: Within Functional Limits for tasks assessed  General Comments       Exercises     Shoulder Instructions      Home Living Family/patient expects to be discharged to:: Private residence Living Arrangements: Spouse/significant other Available Help at Discharge: Family;Available 24 hours/day Type of Home: House Home Access: Ramped entrance     Home  Layout: One level     Bathroom Shower/Tub: Occupational psychologist: Handicapped height     Home Equipment: Bedside commode;Wheelchair - manual;Other (comment);Grab bars - tub/shower(sliding board, drop arm BSC--bariatric)          Prior Functioning/Environment Level of Independence: Independent with assistive device(s)        Comments: at a w/c level; mod I for transfers with sliding board; independent with bathing/dressing        OT Problem List: Decreased strength;Impaired balance (sitting and/or standing);Obesity;Pain      OT Treatment/Interventions: Self-care/ADL training;Patient/family education;Balance training;DME and/or AE instruction    OT Goals(Current goals can be found in the care plan section) Acute Rehab OT Goals Patient Stated Goal: to be able to perform transfers by himself and to go home OT Goal Formulation: With patient Time For Goal Achievement: 12/25/18 Potential to Achieve Goals: Good ADL Goals Pt Will Perform Lower Body Bathing: with modified independence;sitting/lateral leans Pt Will Perform Lower Body Dressing: with modified independence;sitting/lateral leans Pt Will Transfer to Toilet: with modified independence;with transfer board;bedside commode Pt Will Perform Toileting - Clothing Manipulation and hygiene: with modified independence;sitting/lateral leans Additional ADL Goal #1: Pt will demonstrate "good" dynamic sitting balance at EOB for ADL.  OT Frequency: Min 2X/week   Barriers to D/C:            Co-evaluation              AM-PAC OT "6 Clicks" Daily Activity     Outcome Measure Help from another person eating meals?: None Help from another person taking care of personal grooming?: None Help from another person toileting, which includes using toliet, bedpan, or urinal?: A Lot Help from another person bathing (including washing, rinsing, drying)?: A Lot Help from another person to put on and taking off regular upper body  clothing?: A Little Help from another person to put on and taking off regular lower body clothing?: A Lot 6 Click Score: 17   End of Session    Activity Tolerance: Patient tolerated treatment well Patient left: in bed;with call bell/phone within reach;with bed alarm set;with family/visitor present  OT Visit Diagnosis: Pain;Muscle weakness (generalized) (M62.81)                Time: 1530-1600 OT Time Calculation (min): 30 min Charges:  OT General Charges $OT Visit: 1 Visit OT Evaluation $OT Eval Moderate Complexity: 1 Mod OT Treatments $Therapeutic Activity: 8-22 mins  Nestor Lewandowsky, OTR/L Acute Rehabilitation Services Pager: [phone] Office: [phone]  Malka So 12/11/2018, 4:19 PM

## 2018-12-12 ENCOUNTER — Encounter (HOSPITAL_COMMUNITY): Payer: Self-pay | Admitting: Orthopedic Surgery

## 2018-12-12 LAB — CBC
HCT: 35.5 % — ABNORMAL LOW (ref 39.0–52.0)
Hemoglobin: 11.5 g/dL — ABNORMAL LOW (ref 13.0–17.0)
MCH: 27.3 pg (ref 26.0–34.0)
MCHC: 32.4 g/dL (ref 30.0–36.0)
MCV: 84.1 fL (ref 80.0–100.0)
Platelets: 295 10*3/uL (ref 150–400)
RBC: 4.22 MIL/uL (ref 4.22–5.81)
RDW: 16.2 % — ABNORMAL HIGH (ref 11.5–15.5)
WBC: 15.7 10*3/uL — ABNORMAL HIGH (ref 4.0–10.5)
nRBC: 0 % (ref 0.0–0.2)

## 2018-12-12 LAB — GLUCOSE, CAPILLARY
Glucose-Capillary: 273 mg/dL — ABNORMAL HIGH (ref 70–99)
Glucose-Capillary: 362 mg/dL — ABNORMAL HIGH (ref 70–99)
Glucose-Capillary: 270 mg/dL — ABNORMAL HIGH (ref 70–99)
Glucose-Capillary: 177 mg/dL — ABNORMAL HIGH (ref 70–99)

## 2018-12-12 MED ORDER — INFLUENZA VAC A&B SA ADJ QUAD 0.5 ML IM PRSY
0.5000 mL | PREFILLED_SYRINGE | INTRAMUSCULAR | Status: AC
  Administered 2018-12-13: 0.5 mL via INTRAMUSCULAR
  Filled 2018-12-12: qty 0.5

## 2018-12-12 NOTE — Plan of Care (Signed)
  Problem: Education: Goal: Knowledge of the prescribed therapeutic regimen will improve Outcome: Progressing Goal: Ability to verbalize activity precautions or restrictions will improve Outcome: Progressing Goal: Understanding of discharge needs will improve Outcome: Progressing   Problem: Activity: Goal: Ability to perform//tolerate increased activity and mobilize with assistive devices will improve Outcome: Progressing   Problem: Clinical Measurements: Goal: Postoperative complications will be avoided or minimized Outcome: Progressing   Problem: Self-Care: Goal: Ability to meet self-care needs will improve Outcome: Progressing   Problem: Self-Concept: Goal: Ability to maintain and perform role responsibilities to the fullest extent possible will improve Outcome: Progressing   Problem: Pain Management: Goal: Pain level will decrease with appropriate interventions Outcome: Progressing   Problem: Skin Integrity: Goal: Demonstration of wound healing without infection will improve Outcome: Progressing

## 2018-12-12 NOTE — Progress Notes (Signed)
Physical Therapy Treatment Patient Details Name: [name] MRN: [id number] DOB: [birth year] Today's Date: 12/12/2018    History of Present Illness Pt is a 68 y/o male s/p R transfemoral amputation with wound VAC application. PMH including but not limited to L transfemoral amputation in 2019, DM and PVD.    PT Comments    Pt was able to progress OOB to recliner chair via lateral scoot with sliding board. Heavy mod A +2 required for assist and safety. Pt stated he may like to try an AP transfer next time. Pt is very motivated and willing to work. Patient would benefit from continued skilled PT to maximize functional independence and safety with mobility. Will continue to follow acutely.    Follow Up Recommendations  CIR     Equipment Recommendations  None recommended by PT    Recommendations for Other Services       Precautions / Restrictions Precautions Precautions: Fall Precaution Comments: wound VAC Restrictions Weight Bearing Restrictions: Yes RLE Weight Bearing: Non weight bearing LLE Weight Bearing: Non weight bearing Other Position/Activity Restrictions: NWB    Mobility  Bed Mobility Overal bed mobility: Needs Assistance Bed Mobility: Supine to Sit     Supine to sit: Mod assist     General bed mobility comments: mod A to rise into seated position at EOB, once up Pt able to progress hips forward. HOB elevated and use of bed rails.   Transfers Overall transfer level: Needs assistance Equipment used: Sliding board Transfers: Lateral/Scoot Transfers          Lateral/Scoot Transfers: Mod assist;+2 safety/equipment General transfer comment: Pt was able to perform lateral scoot to drop arm recliner with mod A +2 for safety. VC for hand placement and technique. Pt stated he might like to try an AP transfer next time.  Ambulation/Gait                 Stairs             Wheelchair Mobility    Modified Rankin (Stroke Patients Only)        Balance Overall balance assessment: Needs assistance Sitting-balance support: Bilateral upper extremity supported Sitting balance-Leahy Scale: Fair Sitting balance - Comments: poor intially, increased to fair with practice                                    Cognition Arousal/Alertness: Awake/alert Behavior During Therapy: WFL for tasks assessed/performed Overall Cognitive Status: Within Functional Limits for tasks assessed                                        Exercises      General Comments General comments (skin integrity, edema, etc.): Shrinker on R residual limb      Pertinent Vitals/Pain Pain Assessment: Faces Faces Pain Scale: Hurts little more Pain Location: R residual limb Pain Descriptors / Indicators: Operative site guarding Pain Intervention(s): Monitored during session;Limited activity within patient's tolerance;Repositioned    Home Living                      Prior Function            PT Goals (current goals can now be found in the care plan section) Acute Rehab PT Goals Patient Stated Goal: to be able to perform transfers  by himself and to go home PT Goal Formulation: With patient/family Time For Goal Achievement: 12/25/18 Potential to Achieve Goals: Good Progress towards PT goals: Progressing toward goals    Frequency    Min 5X/week      PT Plan Current plan remains appropriate    Co-evaluation              AM-PAC PT "6 Clicks" Mobility   Outcome Measure  Help needed turning from your back to your side while in a flat bed without using bedrails?: A Lot Help needed moving from lying on your back to sitting on the side of a flat bed without using bedrails?: A Lot Help needed moving to and from a bed to a chair (including a wheelchair)?: A Lot Help needed standing up from a chair using your arms (e.g., wheelchair or bedside chair)?: Total Help needed to walk in hospital room?: Total Help needed  climbing 3-5 steps with a railing? : Total 6 Click Score: 9    End of Session Equipment Utilized During Treatment: Gait belt Activity Tolerance: Patient tolerated treatment well Patient left: with call bell/phone within reach;with family/visitor present;in chair Nurse Communication: Mobility status;Need for lift equipment PT Visit Diagnosis: Other abnormalities of gait and mobility (R26.89);Pain Pain - Right/Left: Right Pain - part of body: Leg     Time: [id number] PT Time Calculation (min) (ACUTE ONLY): 31 min  Charges:  $Therapeutic Exercise: 23-37 mins                     Benjiman Core, [place] Pager [phone] Acute Rehab  Allena Katz 12/12/2018, 2:10 PM

## 2018-12-12 NOTE — Progress Notes (Signed)
CSW acknowledges consult for SNF. It appears that patient is a candidate for CIR. SNF consult is being closed out at this time. Please reconsult if disposition plan changes.

## 2018-12-12 NOTE — Progress Notes (Signed)
Patient ID: [name], male   DOB: 01-02-1951, 68 y.o.   MRN: [id number] Patient is postoperative day one right above-the-knee amputation. Patient has no complaints this morning there is no drainage in the wound VAC canister. Patient states the stump shrinker makes his legs feel better. Anticipate discharge to inpatient rehabilitation.

## 2018-12-13 LAB — CBC
HCT: 34 % — ABNORMAL LOW (ref 39.0–52.0)
Hemoglobin: 11.1 g/dL — ABNORMAL LOW (ref 13.0–17.0)
MCH: 27.1 pg (ref 26.0–34.0)
MCHC: 32.6 g/dL (ref 30.0–36.0)
MCV: 83.1 fL (ref 80.0–100.0)
Platelets: 257 10*3/uL (ref 150–400)
RBC: 4.09 MIL/uL — ABNORMAL LOW (ref 4.22–5.81)
RDW: 16.4 % — ABNORMAL HIGH (ref 11.5–15.5)
WBC: 10.5 10*3/uL (ref 4.0–10.5)
nRBC: 0 % (ref 0.0–0.2)

## 2018-12-13 LAB — GLUCOSE, CAPILLARY
Glucose-Capillary: 138 mg/dL — ABNORMAL HIGH (ref 70–99)
Glucose-Capillary: 228 mg/dL — ABNORMAL HIGH (ref 70–99)
Glucose-Capillary: 222 mg/dL — ABNORMAL HIGH (ref 70–99)
Glucose-Capillary: 216 mg/dL — ABNORMAL HIGH (ref 70–99)

## 2018-12-13 NOTE — Plan of Care (Signed)
  Problem: Education: Goal: Knowledge of the prescribed therapeutic regimen will improve Outcome: Progressing Goal: Ability to verbalize activity precautions or restrictions will improve Outcome: Progressing   Problem: Activity: Goal: Ability to perform//tolerate increased activity and mobilize with assistive devices will improve Outcome: Progressing   Problem: Clinical Measurements: Goal: Postoperative complications will be avoided or minimized Outcome: Progressing

## 2018-12-14 ENCOUNTER — Inpatient Hospital Stay (HOSPITAL_COMMUNITY)
Admission: RE | Admit: 2018-12-14 | Discharge: 2018-12-19 | DRG: 560 | Disposition: A | Discharge status: Home Health | Payer: Medicare Other | Source: Intra-hospital | Attending: Physical Medicine & Rehabilitation | Admitting: Physical Medicine & Rehabilitation

## 2018-12-14 ENCOUNTER — Encounter (HOSPITAL_COMMUNITY): Payer: Self-pay | Admitting: Nurse Practitioner

## 2018-12-14 ENCOUNTER — Encounter: Payer: Self-pay | Admitting: Orthopedic Surgery

## 2018-12-14 ENCOUNTER — Other Ambulatory Visit: Payer: Self-pay

## 2018-12-14 DIAGNOSIS — Z794 Long term (current) use of insulin: Secondary | ICD-10-CM

## 2018-12-14 DIAGNOSIS — E1169 Type 2 diabetes mellitus with other specified complication: Secondary | ICD-10-CM

## 2018-12-14 DIAGNOSIS — Z923 Personal history of irradiation: Secondary | ICD-10-CM | POA: Diagnosis not present

## 2018-12-14 DIAGNOSIS — E1142 Type 2 diabetes mellitus with diabetic polyneuropathy: Secondary | ICD-10-CM | POA: Diagnosis present

## 2018-12-14 DIAGNOSIS — D62 Acute posthemorrhagic anemia: Secondary | ICD-10-CM | POA: Diagnosis present

## 2018-12-14 DIAGNOSIS — G4733 Obstructive sleep apnea (adult) (pediatric): Secondary | ICD-10-CM | POA: Diagnosis present

## 2018-12-14 DIAGNOSIS — I2510 Atherosclerotic heart disease of native coronary artery without angina pectoris: Secondary | ICD-10-CM | POA: Diagnosis present

## 2018-12-14 DIAGNOSIS — Z820 Family history of epilepsy and other diseases of the nervous system: Secondary | ICD-10-CM

## 2018-12-14 DIAGNOSIS — I10 Essential (primary) hypertension: Secondary | ICD-10-CM | POA: Diagnosis present

## 2018-12-14 DIAGNOSIS — Z801 Family history of malignant neoplasm of trachea, bronchus and lung: Secondary | ICD-10-CM | POA: Diagnosis not present

## 2018-12-14 DIAGNOSIS — E1151 Type 2 diabetes mellitus with diabetic peripheral angiopathy without gangrene: Secondary | ICD-10-CM | POA: Diagnosis present

## 2018-12-14 DIAGNOSIS — E669 Obesity, unspecified: Secondary | ICD-10-CM | POA: Diagnosis not present

## 2018-12-14 DIAGNOSIS — Z87891 Personal history of nicotine dependence: Secondary | ICD-10-CM | POA: Diagnosis not present

## 2018-12-14 DIAGNOSIS — Z951 Presence of aortocoronary bypass graft: Secondary | ICD-10-CM | POA: Diagnosis not present

## 2018-12-14 DIAGNOSIS — S78111A Complete traumatic amputation at level between right hip and knee, initial encounter: Secondary | ICD-10-CM | POA: Diagnosis not present

## 2018-12-14 DIAGNOSIS — Z8249 Family history of ischemic heart disease and other diseases of the circulatory system: Secondary | ICD-10-CM

## 2018-12-14 DIAGNOSIS — Z4781 Encounter for orthopedic aftercare following surgical amputation: Secondary | ICD-10-CM | POA: Diagnosis not present

## 2018-12-14 DIAGNOSIS — Z7982 Long term (current) use of aspirin: Secondary | ICD-10-CM

## 2018-12-14 DIAGNOSIS — E1149 Type 2 diabetes mellitus with other diabetic neurological complication: Secondary | ICD-10-CM | POA: Diagnosis present

## 2018-12-14 DIAGNOSIS — Z89512 Acquired absence of left leg below knee: Secondary | ICD-10-CM

## 2018-12-14 DIAGNOSIS — I739 Peripheral vascular disease, unspecified: Secondary | ICD-10-CM | POA: Diagnosis not present

## 2018-12-14 DIAGNOSIS — Z888 Allergy status to other drugs, medicaments and biological substances status: Secondary | ICD-10-CM | POA: Diagnosis not present

## 2018-12-14 DIAGNOSIS — E1141 Type 2 diabetes mellitus with diabetic mononeuropathy: Secondary | ICD-10-CM | POA: Diagnosis present

## 2018-12-14 DIAGNOSIS — M1990 Unspecified osteoarthritis, unspecified site: Secondary | ICD-10-CM | POA: Diagnosis present

## 2018-12-14 DIAGNOSIS — E785 Hyperlipidemia, unspecified: Secondary | ICD-10-CM | POA: Diagnosis present

## 2018-12-14 DIAGNOSIS — Z8546 Personal history of malignant neoplasm of prostate: Secondary | ICD-10-CM

## 2018-12-14 DIAGNOSIS — Z89611 Acquired absence of right leg above knee: Secondary | ICD-10-CM

## 2018-12-14 DIAGNOSIS — D72829 Elevated white blood cell count, unspecified: Secondary | ICD-10-CM

## 2018-12-14 DIAGNOSIS — Z89511 Acquired absence of right leg below knee: Secondary | ICD-10-CM

## 2018-12-14 DIAGNOSIS — Z85828 Personal history of other malignant neoplasm of skin: Secondary | ICD-10-CM | POA: Diagnosis not present

## 2018-12-14 DIAGNOSIS — Z89612 Acquired absence of left leg above knee: Secondary | ICD-10-CM

## 2018-12-14 DIAGNOSIS — K5900 Constipation, unspecified: Secondary | ICD-10-CM | POA: Diagnosis present

## 2018-12-14 LAB — GLUCOSE, CAPILLARY
Glucose-Capillary: 185 mg/dL — ABNORMAL HIGH (ref 70–99)
Glucose-Capillary: 328 mg/dL — ABNORMAL HIGH (ref 70–99)
Glucose-Capillary: 214 mg/dL — ABNORMAL HIGH (ref 70–99)
Glucose-Capillary: 187 mg/dL — ABNORMAL HIGH (ref 70–99)

## 2018-12-14 LAB — SURGICAL PATHOLOGY

## 2018-12-14 MED ORDER — INSULIN ASPART 100 UNIT/ML ~~LOC~~ SOLN
0.0000 [IU] | Freq: Every day | SUBCUTANEOUS | Status: DC
  Administered 2018-12-15 – 2018-12-17 (×3): 2 [IU] via SUBCUTANEOUS
  Administered 2018-12-18: 3 [IU] via SUBCUTANEOUS

## 2018-12-14 MED ORDER — CARVEDILOL 25 MG PO TABS
25.0000 mg | ORAL_TABLET | Freq: Two times a day (BID) | ORAL | Status: DC
  Administered 2018-12-14 – 2018-12-19 (×10): 25 mg via ORAL
  Filled 2018-12-14 (×10): qty 1

## 2018-12-14 MED ORDER — ALUM & MAG HYDROXIDE-SIMETH 200-200-20 MG/5ML PO SUSP: 30.0000 mL | ORAL | Status: DC | PRN

## 2018-12-14 MED ORDER — PROCHLORPERAZINE 25 MG RE SUPP: 12.5000 mg | Freq: Four times a day (QID) | RECTAL | Status: DC | PRN

## 2018-12-14 MED ORDER — POLYETHYLENE GLYCOL 3350 17 G PO PACK: 17.0000 g | PACK | Freq: Every day | ORAL | Status: DC | PRN

## 2018-12-14 MED ORDER — INSULIN GLARGINE 100 UNIT/ML ~~LOC~~ SOLN
40.0000 [IU] | Freq: Every day | SUBCUTANEOUS | Status: DC
  Administered 2018-12-14 – 2018-12-16 (×3): 40 [IU] via SUBCUTANEOUS
  Filled 2018-12-14 (×4): qty 0.4

## 2018-12-14 MED ORDER — METHOCARBAMOL 500 MG PO TABS
500.0000 mg | ORAL_TABLET | Freq: Four times a day (QID) | ORAL | Status: DC | PRN
  Administered 2018-12-16 – 2018-12-18 (×4): 500 mg via ORAL
  Filled 2018-12-14 (×4): qty 1

## 2018-12-14 MED ORDER — TRAMADOL HCL 50 MG PO TABS
50.0000 mg | ORAL_TABLET | Freq: Four times a day (QID) | ORAL | Status: DC | PRN
  Administered 2018-12-16: 50 mg via ORAL
  Filled 2018-12-14: qty 1

## 2018-12-14 MED ORDER — BISACODYL 10 MG RE SUPP: 10.0000 mg | Freq: Every day | RECTAL | Status: DC | PRN

## 2018-12-14 MED ORDER — TRAZODONE HCL 50 MG PO TABS
25.0000 mg | ORAL_TABLET | Freq: Every evening | ORAL | Status: DC | PRN
  Filled 2018-12-14 (×2): qty 1

## 2018-12-14 MED ORDER — POLYETHYLENE GLYCOL 3350 17 G PO PACK
17.0000 g | PACK | Freq: Once | ORAL | Status: AC
  Administered 2018-12-14: 17 g via ORAL
  Filled 2018-12-14: qty 1

## 2018-12-14 MED ORDER — DOCUSATE SODIUM 100 MG PO CAPS: 100.0000 mg | ORAL_CAPSULE | Freq: Two times a day (BID) | ORAL | 0 refills | Status: DC

## 2018-12-14 MED ORDER — OXYCODONE HCL 5 MG PO TABS
5.0000 mg | ORAL_TABLET | ORAL | Status: DC | PRN
  Administered 2018-12-15 – 2018-12-18 (×8): 10 mg via ORAL
  Filled 2018-12-14 (×9): qty 2

## 2018-12-14 MED ORDER — ACETAMINOPHEN 325 MG PO TABS: 325.0000 mg | ORAL_TABLET | Freq: Four times a day (QID) | ORAL | Status: DC | PRN

## 2018-12-14 MED ORDER — LOSARTAN POTASSIUM 50 MG PO TABS
50.0000 mg | ORAL_TABLET | Freq: Every day | ORAL | Status: DC
  Administered 2018-12-15 – 2018-12-19 (×5): 50 mg via ORAL
  Filled 2018-12-14 (×5): qty 1

## 2018-12-14 MED ORDER — SENNOSIDES-DOCUSATE SODIUM 8.6-50 MG PO TABS: 1.0000 | ORAL_TABLET | Freq: Every day | ORAL | Status: DC

## 2018-12-14 MED ORDER — DOCUSATE SODIUM 100 MG PO CAPS
100.0000 mg | ORAL_CAPSULE | Freq: Two times a day (BID) | ORAL | Status: DC
  Administered 2018-12-14 – 2018-12-19 (×9): 100 mg via ORAL
  Filled 2018-12-14 (×10): qty 1

## 2018-12-14 MED ORDER — ACETAMINOPHEN 325 MG PO TABS
325.0000 mg | ORAL_TABLET | ORAL | Status: DC | PRN
  Administered 2018-12-18 (×2): 650 mg via ORAL
  Filled 2018-12-14 (×3): qty 2

## 2018-12-14 MED ORDER — ENOXAPARIN SODIUM 40 MG/0.4ML ~~LOC~~ SOLN
40.0000 mg | SUBCUTANEOUS | Status: DC
  Administered 2018-12-14 – 2018-12-18 (×5): 40 mg via SUBCUTANEOUS
  Filled 2018-12-14 (×5): qty 0.4

## 2018-12-14 MED ORDER — PROCHLORPERAZINE MALEATE 5 MG PO TABS: 5.0000 mg | ORAL_TABLET | Freq: Four times a day (QID) | ORAL | Status: DC | PRN

## 2018-12-14 MED ORDER — INSULIN ASPART 100 UNIT/ML ~~LOC~~ SOLN
0.0000 [IU] | Freq: Three times a day (TID) | SUBCUTANEOUS | Status: DC
  Administered 2018-12-14: 3 [IU] via SUBCUTANEOUS
  Administered 2018-12-15 (×2): 2 [IU] via SUBCUTANEOUS
  Administered 2018-12-15: 3 [IU] via SUBCUTANEOUS
  Administered 2018-12-16: 2 [IU] via SUBCUTANEOUS
  Administered 2018-12-16: 3 [IU] via SUBCUTANEOUS
  Administered 2018-12-16: 2 [IU] via SUBCUTANEOUS
  Administered 2018-12-17 (×2): 3 [IU] via SUBCUTANEOUS
  Administered 2018-12-17 – 2018-12-18 (×4): 2 [IU] via SUBCUTANEOUS
  Administered 2018-12-19: 3 [IU] via SUBCUTANEOUS

## 2018-12-14 MED ORDER — PROCHLORPERAZINE EDISYLATE 10 MG/2ML IJ SOLN: 5.0000 mg | Freq: Four times a day (QID) | INTRAMUSCULAR | Status: DC | PRN

## 2018-12-14 MED ORDER — FLEET ENEMA 7-19 GM/118ML RE ENEM: 1.0000 | ENEMA | Freq: Once | RECTAL | Status: DC | PRN

## 2018-12-14 MED ORDER — DIPHENHYDRAMINE HCL 12.5 MG/5ML PO ELIX: 12.5000 mg | ORAL_SOLUTION | Freq: Four times a day (QID) | ORAL | Status: DC | PRN

## 2018-12-14 MED ORDER — HYDROCHLOROTHIAZIDE 12.5 MG PO CAPS
12.5000 mg | ORAL_CAPSULE | Freq: Every day | ORAL | Status: DC
  Administered 2018-12-15 – 2018-12-19 (×5): 12.5 mg via ORAL
  Filled 2018-12-14 (×5): qty 1

## 2018-12-14 MED ORDER — GUAIFENESIN-DM 100-10 MG/5ML PO SYRP: 5.0000 mL | ORAL_SOLUTION | Freq: Four times a day (QID) | ORAL | Status: DC | PRN

## 2018-12-14 MED ORDER — METFORMIN HCL 500 MG PO TABS
1000.0000 mg | ORAL_TABLET | Freq: Every day | ORAL | Status: DC
  Administered 2018-12-15 – 2018-12-18 (×4): 1000 mg via ORAL
  Filled 2018-12-14 (×4): qty 2

## 2018-12-14 MED ORDER — SENNOSIDES-DOCUSATE SODIUM 8.6-50 MG PO TABS
2.0000 | ORAL_TABLET | Freq: Every day | ORAL | Status: DC
  Administered 2018-12-14 – 2018-12-16 (×3): 2 via ORAL
  Filled 2018-12-14 (×4): qty 2

## 2018-12-14 MED ORDER — POTASSIUM CITRATE ER 10 MEQ (1080 MG) PO TBCR
20.0000 meq | EXTENDED_RELEASE_TABLET | Freq: Two times a day (BID) | ORAL | Status: DC
  Administered 2018-12-14 – 2018-12-19 (×10): 20 meq via ORAL
  Filled 2018-12-14 (×10): qty 2

## 2018-12-14 MED ORDER — OXYCODONE HCL 5 MG PO TABS: 5.0000 mg | ORAL_TABLET | ORAL | 0 refills | Status: DC | PRN

## 2018-12-14 MED ORDER — ASPIRIN 325 MG PO TABS
325.0000 mg | ORAL_TABLET | Freq: Every day | ORAL | Status: DC
  Administered 2018-12-15 – 2018-12-19 (×5): 325 mg via ORAL
  Filled 2018-12-14 (×5): qty 1

## 2018-12-14 NOTE — Progress Notes (Signed)
Inpatient Rehabilitation Admissions Coordinator  Inpatient rehab consult received. I met with patient with his significant other, Libby at bedside. We discussed goals and expectations of an inpt rehab admit and they are in agreement. I will make the arrangements to admit today. RN CM made aware.  Danne Baxter, RN, MSN Rehab Admissions Coordinator [phone] 12/14/2018 11:28 AM

## 2018-12-14 NOTE — Progress Notes (Signed)
Meredith Staggers, MD  Physician  Physical Medicine and Rehabilitation  PMR Pre-admission  Signed  Date of Service:  12/14/2018 1:01 PM      Related encounter: Admission (Discharged) from 12/11/2018 in [place]         Show:Clear all '[x]'$ Manual'[x]'$ Template'[x]'$ Copied  Added by: '[x]'$ Cristina Gong, RN'[x]'$ Meredith Staggers, MD  '[]'$ Hover for details PMR Admission Coordinator Pre-Admission Assessment  Patient: [name] is an 68 y.o., male MRN: [id number] DOB: 01-25-1950 Height: 6' 2.02" (188 cm) Weight: 117.9 kg  Insurance Information HMO:     PPO:      PCP:      IPA:      80/20:      OTHER:  PRIMARY: Medicare a and b      Policy#: [id number]      Subscriber: pt Benefits:  Phone #: passport one online     Name: 12/7 Eff. Date: 05/07/2013     Deduct: $1408      Out of Pocket Max: none      Life Max: none CIR: 100%      SNF: 20 full days Outpatient: 80%     Co-Pay: 20% Home Health: 100%      Co-Pay: none DME: 80%     Co-Pay: 20% Providers: pt choice  SECONDARY: [employer]      Policy#: [id number]      Subscriber: pt  Medicaid Application Date:       Case Manager:  Disability Application Date:       Case Worker:   The Data Collection Information Summary for patients in Inpatient Rehabilitation Facilities with attached Privacy Act [place] Records was provided and verbally reviewed with: Patient  Emergency Contact Information         Contact Information    Name Relation Home Work [place] Son [phone]  [place] Significant other [phone]  [phone]   [name] Daughter   [phone]   Jaeden, Mess[place]   [phone]      Current Medical History  Patient Admitting Diagnosis: Bilateral AKA  History of Present Illness:68 year old male with history of CAD, T2DM--with diabetic polyneuropathy, PVD s/p L-BKA 12/2017 (did not get  prosthesis yet due to Covid restrictions) and right foot ulcer which was being managed on outpatient basis with close follow up. He continued to have progressive gangrenous changes with foul smelling drainage and was admitted on 12/11/18 for R-BKA by Dr. [name]. Post op with ABLA and reactive leucocytosis resolving.   Patient's medical record from [facility]  has been reviewed by the rehabilitation admission coordinator and physician.  Past Medical History      Past Medical History:  Diagnosis Date   Cellulitis and abscess of left lower extremity 12/20/2017   Coronary atherosclerosis of unspecified type of vessel, native or graft    Diabetes mellitus without complication ([place])    type 2   Hemorrhage of rectum and anus    History of kidney stones    passed stones   Impotence of organic origin    Internal hemorrhoids without mention of complication    Left below-knee amputee ([place]) 02/23/2016   Malignant neoplasm of prostate (HCC)    Mononeuritis of unspecified site    Osteoarthrosis, unspecified whether generalized or localized, unspecified site    Other and unspecified hyperlipidemia    Peripheral vascular disease ([place])    Personal history  of colonic polyps    Personal history of diabetic foot ulcer    saw wound center, resolved 05/08/2010   Personal history of gallstones    Routine general medical examination at a health care facility    Subacute osteomyelitis, right ankle and foot ([place])    Type II or unspecified type diabetes mellitus with neurological manifestations, not stated as uncontrolled(250.60)    Unspecified glaucoma(365.9)    Unspecified sleep apnea    cpcp   Unspecified venous (peripheral) insufficiency     Family History   family history includes Heart attack in an other family member; Lung cancer in his father; Multiple sclerosis in his mother; Peripheral vascular disease in his maternal grandfather.  Prior  Rehab/Hospitalizations Has the patient had prior rehab or hospitalizations prior to admission? Yes CIR 12/2017  Has the patient had major surgery during 100 days prior to admission? Yes             Current Medications  Current Facility-Administered Medications:    0.9 %  sodium chloride infusion, , Intravenous, Continuous, Persons, Bevely Palmer, [place], Stopped at 12/12/18 1202   acetaminophen (TYLENOL) tablet 325-650 mg, 325-650 mg, Oral, Q6H PRN, Persons, Bevely Palmer, PA   aspirin tablet 325 mg, 325 mg, Oral, Daily, Persons, Bevely Palmer, [place], 325 mg at 12/14/18 4132   carvedilol (COREG) tablet 25 mg, 25 mg, Oral, BID, Persons, Bevely Palmer, PA, 25 mg at 12/14/18 4401   docusate sodium (COLACE) capsule 100 mg, 100 mg, Oral, BID, Persons, Bevely Palmer, PA, 100 mg at 12/14/18 0272   losartan (COZAAR) tablet 50 mg, 50 mg, Oral, Daily, 50 mg at 12/14/18 5366 **AND** hydrochlorothiazide (MICROZIDE) capsule 12.5 mg, 12.5 mg, Oral, Daily, [name], MD, 12.5 mg at 12/14/18 4403   HYDROmorphone (DILAUDID) injection 0.5 mg, 0.5 mg, Intravenous, Q4H PRN, Persons, Bevely Palmer, PA, 0.5 mg at 12/12/18 1201   insulin aspart (novoLOG) injection 0-15 Units, 0-15 Units, Subcutaneous, TID WC, Persons, Bevely Palmer, PA, 11 Units at 12/14/18 1206   insulin glargine (LANTUS) injection 40 Units, 40 Units, Subcutaneous, QHS, Persons, Bevely Palmer, [place], 40 Units at 12/13/18 2123   metFORMIN (GLUCOPHAGE) tablet 1,000 mg, 1,000 mg, Oral, Q lunch, Persons, Bevely Palmer, PA, 1,000 mg at 12/14/18 1208   metoCLOPramide (REGLAN) tablet 5-10 mg, 5-10 mg, Oral, Q8H PRN **OR** metoCLOPramide (REGLAN) injection 5-10 mg, 5-10 mg, Intravenous, Q8H PRN, Persons, Bevely Palmer, PA   ondansetron (ZOFRAN) tablet 4 mg, 4 mg, Oral, Q6H PRN **OR** ondansetron (ZOFRAN) injection 4 mg, 4 mg, Intravenous, Q6H PRN, Persons, Bevely Palmer, PA   oxyCODONE (Oxy IR/ROXICODONE) immediate release tablet 5-10 mg, 5-10 mg, Oral, Q4H PRN, Persons, Bevely Palmer, PA, 10  mg at 12/14/18 1054   potassium citrate (UROCIT-K) SR tablet 20 mEq, 20 mEq, Oral, BID, Persons, Bevely Palmer, PA, 20 mEq at 12/14/18 4742  Patients Current Diet:     Diet Order                  Diet - low sodium heart healthy         Diet Carb Modified Fluid consistency: Thin; Room service appropriate? Yes  Diet effective now               Precautions / Restrictions Precautions Precautions: Fall Precaution Comments: wound VAC Restrictions Weight Bearing Restrictions: Yes RLE Weight Bearing: Non weight bearing LLE Weight Bearing: Non weight bearing Other Position/Activity Restrictions: NWB   Has the patient had 2 or more falls or a  fall with injury in the past year? No  Prior Activity Level Limited Community (1-2x/wk): limited since 03/2018 due to pandemic.  Prior Functional Level Self Care: Did the patient need help bathing, dressing, using the toilet or eating? Needed some help  Indoor Mobility: Did the patient need assistance with walking from room to room (with or without device)? Independent  Stairs: Did the patient need assistance with internal or external stairs (with or without device)? Needed some help  Functional Cognition: Did the patient need help planning regular tasks such as shopping or remembering to take medications? Independent  Home Assistive Devices / Equipment Home Assistive Devices/Equipment: Wheelchair, CBG Meter, CPAP, Eyeglasses Home Equipment: Bedside commode, Wheelchair - manual, Other (comment), Grab bars - tub/shower(sliding board, drop arm BSC--bariatric)  Prior Device Use: Indicate devices/aids used by the patient prior to current illness, exacerbation or injury? Manual wheelchair  Current Functional Level Cognition  Overall Cognitive Status: Within Functional Limits for tasks assessed Orientation Level: Oriented X4    Extremity Assessment (includes Sensation/Coordination)  Upper Extremity Assessment: Overall WFL  for tasks assessed  Lower Extremity Assessment: Defer to PT evaluation RLE Deficits / Details: greatly limited with assessment secondary to significant pain; wound VAC in place to distal aspect of residual limb RLE: Unable to fully assess due to pain LLE Deficits / Details: prior transfemoral amputation; able to lift against gravity during functional mobility    ADLs  Overall ADL's : Needs assistance/impaired Eating/Feeding: Independent Grooming: Set up, Min guard, Sitting Upper Body Bathing: Sitting, Minimal assistance Lower Body Bathing: Sitting/lateral leans, Moderate assistance Upper Body Dressing : Sitting, Min guard Lower Body Dressing: Sitting/lateral leans, Moderate assistance General ADL Comments: pt reports performing LB ADL sitting and leaning side to side prior to admission    Mobility  Overal bed mobility: Needs Assistance Bed Mobility: Supine to Sit, Sit to Supine Supine to sit: Min assist, HOB elevated Sit to supine: Min guard General bed mobility comments: HOB 30 degrees with bil rails pt able to pull into long sitting with cues for sequence and safety with guarding for initial balance. pt then able to pivot 90 degrees in long sitting    Transfers  Overall transfer level: Needs assistance Equipment used: Sliding board Transfers: Lateral/Scoot Transfers, Government social research officer transfers: Min guard  Lateral/Scoot Transfers: Min assist, +2 safety/equipment General transfer comment: minguard with setup for transfers bed to recliner with A/P transfer pt performed bed<>chair then lateral transfer with drop arm toward right. Pt got "stuck" in the middle between surfaces with increased time and cues to sequence and min +2 for safety with transfer with pt ultimately only clearing right glute to bed and then lying on his side in the bed to complete transfer. PT fatigued with lateral transfer and unable to tolerate physical assist with transfer due to  pain. With lateral transfers would continue sliding board    Ambulation / Gait / Stairs / Wheelchair Mobility  Ambulation/Gait General Gait Details: N/A    Posture / Balance Dynamic Sitting Balance Sitting balance - Comments: poor intially, increased to fair with practice Balance Overall balance assessment: Needs assistance Sitting-balance support: Bilateral upper extremity supported Sitting balance-Leahy Scale: Poor Sitting balance - Comments: poor intially, increased to fair with practice    Special needs/care consideration BiPAP/CPAP  Yes CPM  Continuous Drip IV  Dialysis         Days  Life Vest  Oxygen  Special Bed  Trach Size  Wound Vac surgical incision wound VAC Skin surgical  incision Bowel mgmt:  continent Bladder mgmt:  continent Diabetic mgmt:  yes Behavioral consideration   Chemo/radiation  Designated visitor is [name]   Previous Home Environment  Living Arrangements: Spouse/significant other  Lives With: Significant other Available Help at Discharge: Family, Available 24 hours/day Type of Home: House Home Layout: One level Home Access: Ramped entrance Bathroom Shower/Tub: Multimedia programmer: Handicapped height Bathroom Accessibility: Yes How Accessible: Accessible via wheelchair [place]: (none, was doing outpt until 03/2018)  Discharge Living Setting Plans for Discharge Living Setting: Patient's home, Lives with (comment)(Libby) Type of Home at Discharge: House Discharge Home Layout: One level Discharge Home Access: [place] entrance Discharge Bathroom Shower/Tub: Walk-in shower Discharge Bathroom Toilet: Handicapped height Discharge Bathroom Accessibility: Yes How Accessible: Accessible via wheelchair Does the patient have any problems obtaining your medications?: No  Social/Family/Support Systems Contact Information: Libby, significant other Anticipated Caregiver: Therapist, music Information: see  above Ability/Limitations of Caregiver: no limits Caregiver Availability: 24/7 Discharge Plan Discussed with Primary Caregiver: Yes Is Caregiver In Agreement with Plan?: Yes Does Caregiver/Family have Issues with Lodging/Transportation while Pt is in Rehab?: No  Goals/Additional Needs Patient/Family Goal for Rehab: Mod I with PT and OT at wheelchair level Expected length of stay: ELOS 7 to 10 days Pt/Family Agrees to Admission and willing to participate: Yes Program Orientation Provided & Reviewed with Pt/Caregiver Including Roles  & Responsibilities: Yes  Decrease burden of Care through IP rehab admission:  Possible need for SNF placement upon discharge:   Patient Condition: I have reviewed medical records from [facility] , spoken with CM, and patient and family member. I met with patient at the bedside for inpatient rehabilitation assessment.  Patient will benefit from ongoing PT and OT, can actively participate in 3 hours of therapy a day 5 days of the week, and can make measurable gains during the admission.  Patient will also benefit from the coordinated team approach during an Inpatient Acute Rehabilitation admission.  The patient will receive intensive therapy as well as Rehabilitation physician, nursing, social worker, and care management interventions.  Due to bladder management, bowel management, safety, skin/wound care, disease management, medication administration, pain management and patient education the patient requires 24 hour a day rehabilitation nursing.  The patient is currently min assist with mobility and basic ADLs.  Discharge setting and therapy post discharge at home with home health is anticipated.  Patient has agreed to participate in the Acute Inpatient Rehabilitation Program and will admit today.  Preadmission Screen Completed By:  [name], 12/14/2018 1:01 PM ______________________________________________________________________     Discussed status with Dr. Naaman Plummer  on  12/14/2018 at 1306 and received approval for admission today.  Admission Coordinator:  [name], RN, time  2836 Date  12/14/2018   Assessment/Plan: Diagnosis: new right AKA, prior left AKA 1. Does the need for close, 24 hr/day Medical supervision in concert with the patient's rehab needs make it unreasonable for this patient to be served in a less intensive setting? Yes 2. Co-Morbidities requiring supervision/potential complications: CAD, DM with polyneuropathy, OSA, obesity 3. Due to bladder management, bowel management, safety, skin/wound care, disease management, medication administration, pain management and patient education, does the patient require 24 hr/day rehab nursing? Yes 4. Does the patient require coordinated care of a physician, rehab nurse, PT, OT, and SLP to address physical and functional deficits in the context of the above medical diagnosis(es)? Yes Addressing deficits in the following areas: balance, endurance, locomotion, strength, transferring, bowel/bladder  control, bathing, dressing, feeding, grooming, toileting and psychosocial support 5. Can the patient actively participate in an intensive therapy program of at least 3 hrs of therapy 5 days a week? Yes 6. The potential for patient to make measurable gains while on inpatient rehab is excellent 7. Anticipated functional outcomes upon discharge from inpatient rehab: modified independent PT, modified independent OT, n/a SLP 8. Estimated rehab length of stay to reach the above functional goals is: 7-10 days 9. Anticipated discharge destination: Home 10. Overall Rehab/Functional Prognosis: excellent   MD Signature: Meredith Staggers, MD, [facility] 12/14/2018         Revision History

## 2018-12-14 NOTE — Progress Notes (Signed)
Pt report given to Chelsea;receiving RN for 4W Rehab & pt transferred by SWOT RN Ginger.

## 2018-12-14 NOTE — Progress Notes (Signed)
Reviewed PT and rehab notes.   Patient comfortable. VSS. 100 in VAC. Dressing in Place  Will plan for rehab transfer today if possible

## 2018-12-14 NOTE — PMR Pre-admission (Signed)
PMR Admission Coordinator Pre-Admission Assessment  Patient: [name] is an 68 y.o., male MRN: [id number] DOB: 05-22-1950 Height: 6' 2.02" (188 cm) Weight: 117.9 kg  Insurance Information HMO:     PPO:      PCP:      IPA:      80/20:      OTHER:  PRIMARY: Medicare a and b      Policy#: [id number]      Subscriber: pt Benefits:  Phone #: passport one online     Name: 12/7 Eff. Date: 05/07/2013     Deduct: $1408      Out of Pocket Max: none      Life Max: none CIR: 100%      SNF: 20 full days Outpatient: 80%     Co-Pay: 20% Home Health: 100%      Co-Pay: none DME: 80%     Co-Pay: 20% Providers: pt choice  SECONDARY: [employer]      Policy#: [id number]      Subscriber: pt  Medicaid Application Date:       Case Manager:  Disability Application Date:       Case Worker:   The "Data Collection Information Summary" for patients in Inpatient Rehabilitation Facilities with attached "Privacy Act [place] Records" was provided and verbally reviewed with: Patient  Emergency Contact Information Contact Information    Name Relation Home Work [place] Son [phone]  [place] Significant other [phone]  [phone]   [name] Daughter   [phone]   Jarius, Dieudonne   [phone]      Current Medical History  Patient Admitting Diagnosis: Bilateral AKA  History of Present Illness:68 year old male with history of CAD, T2DM--with diabetic polyneuropathy, PVD s/p L-BKA 12/2017 (did not get prosthesis yet due to Covid restrictions)  and right foot ulcer which was being managed on outpatient basis with close follow up. He continued to have progressive gangrenous changes with foul smelling drainage and was admitted on  12/11/18 for R-BKA by Dr. [name]. Post op with ABLA and reactive leucocytosis resolving.   Patient's medical record from [facility]  has been reviewed by the rehabilitation admission coordinator and  physician.  Past Medical History  Past Medical History:  Diagnosis Date  . Cellulitis and abscess of left lower extremity 12/20/2017  . Coronary atherosclerosis of unspecified type of vessel, native or graft   . Diabetes mellitus without complication ([place])    type 2  . Hemorrhage of rectum and anus   . History of kidney stones    passed stones  . Impotence of organic origin   . Internal hemorrhoids without mention of complication   . Left below-knee amputee ([place]) 02/23/2016  . Malignant neoplasm of prostate ([place])   . Mononeuritis of unspecified site   . Osteoarthrosis, unspecified whether generalized or localized, unspecified site   . Other and unspecified hyperlipidemia   . Peripheral vascular disease ([place])   . Personal history of colonic polyps   . Personal history of diabetic foot ulcer    saw wound center, resolved 05/08/2010  . Personal history of gallstones   . Routine general medical examination at a health care facility   . Subacute osteomyelitis, right ankle and foot ([place])   . Type II or unspecified type diabetes mellitus with neurological manifestations, not stated as uncontrolled(250.60)   . Unspecified glaucoma(365.9)   . Unspecified sleep apnea    cpcp  . Unspecified venous (  peripheral) insufficiency     Family History   family history includes Heart attack in an other family member; Lung cancer in his father; Multiple sclerosis in his mother; Peripheral vascular disease in his maternal grandfather.  Prior Rehab/Hospitalizations Has the patient had prior rehab or hospitalizations prior to admission? Yes CIR 12/2017  Has the patient had major surgery during 100 days prior to admission? Yes   Current Medications  Current Facility-Administered Medications:  .  0.9 %  sodium chloride infusion, , Intravenous, Continuous, Persons, Bevely Palmer, [place], Stopped at 12/12/18 1202 .  acetaminophen (TYLENOL) tablet 325-650 mg, 325-650 mg, Oral, Q6H PRN, Persons, Bevely Palmer, PA .   aspirin tablet 325 mg, 325 mg, Oral, Daily, Persons, Bevely Palmer, [place], 325 mg at 12/14/18 2620 .  carvedilol (COREG) tablet 25 mg, 25 mg, Oral, BID, Persons, Bevely Palmer, [place], 25 mg at 12/14/18 3559 .  docusate sodium (COLACE) capsule 100 mg, 100 mg, Oral, BID, Persons, Bevely Palmer, PA, 100 mg at 12/14/18 7416 .  losartan (COZAAR) tablet 50 mg, 50 mg, Oral, Daily, 50 mg at 12/14/18 0923 **AND** hydrochlorothiazide (MICROZIDE) capsule 12.5 mg, 12.5 mg, Oral, Daily, [name], MD, 12.5 mg at 12/14/18 0924 .  HYDROmorphone (DILAUDID) injection 0.5 mg, 0.5 mg, Intravenous, Q4H PRN, Persons, Bevely Palmer, PA, 0.5 mg at 12/12/18 1201 .  insulin aspart (novoLOG) injection 0-15 Units, 0-15 Units, Subcutaneous, TID WC, Persons, Bevely Palmer, [place], 11 Units at 12/14/18 1206 .  insulin glargine (LANTUS) injection 40 Units, 40 Units, Subcutaneous, QHS, Persons, Bevely Palmer, [place], 40 Units at 12/13/18 2123 .  metFORMIN (GLUCOPHAGE) tablet 1,000 mg, 1,000 mg, Oral, Q lunch, Persons, Bevely Palmer, [place], 1,000 mg at 12/14/18 1208 .  metoCLOPramide (REGLAN) tablet 5-10 mg, 5-10 mg, Oral, Q8H PRN **OR** metoCLOPramide (REGLAN) injection 5-10 mg, 5-10 mg, Intravenous, Q8H PRN, Persons, Bevely Palmer, PA .  ondansetron (ZOFRAN) tablet 4 mg, 4 mg, Oral, Q6H PRN **OR** ondansetron (ZOFRAN) injection 4 mg, 4 mg, Intravenous, Q6H PRN, Persons, Bevely Palmer, PA .  oxyCODONE (Oxy IR/ROXICODONE) immediate release tablet 5-10 mg, 5-10 mg, Oral, Q4H PRN, Persons, Bevely Palmer, PA, 10 mg at 12/14/18 1054 .  potassium citrate (UROCIT-K) SR tablet 20 mEq, 20 mEq, Oral, BID, Persons, Bevely Palmer, [place], 20 mEq at 12/14/18 3845  Patients Current Diet:  Diet Order            Diet - low sodium heart healthy        Diet Carb Modified Fluid consistency: Thin; Room service appropriate? Yes  Diet effective now              Precautions / Restrictions Precautions Precautions: Fall Precaution Comments: wound VAC Restrictions Weight Bearing Restrictions: Yes RLE  Weight Bearing: Non weight bearing LLE Weight Bearing: Non weight bearing Other Position/Activity Restrictions: NWB   Has the patient had 2 or more falls or a fall with injury in the past year? No  Prior Activity Level Limited Community (1-2x/wk): limited since 03/2018 due to pandemic.  Prior Functional Level Self Care: Did the patient need help bathing, dressing, using the toilet or eating? Needed some help  Indoor Mobility: Did the patient need assistance with walking from room to room (with or without device)? Independent  Stairs: Did the patient need assistance with internal or external stairs (with or without device)? Needed some help  Functional Cognition: Did the patient need help planning regular tasks such as shopping or remembering to take medications? [place] /  Equipment Home Assistive Devices/Equipment: Wheelchair, CBG Meter, CPAP, Eyeglasses Home Equipment: Bedside commode, Wheelchair - manual, Other (comment), Grab bars - tub/shower(sliding board, drop arm BSC--bariatric)  Prior Device Use: Indicate devices/aids used by the patient prior to current illness, exacerbation or injury? Manual wheelchair  Current Functional Level Cognition  Overall Cognitive Status: Within Functional Limits for tasks assessed Orientation Level: Oriented X4    Extremity Assessment (includes Sensation/Coordination)  Upper Extremity Assessment: Overall WFL for tasks assessed  Lower Extremity Assessment: Defer to PT evaluation RLE Deficits / Details: greatly limited with assessment secondary to significant pain; wound VAC in place to distal aspect of residual limb RLE: Unable to fully assess due to pain LLE Deficits / Details: prior transfemoral amputation; able to lift against gravity during functional mobility    ADLs  Overall ADL's : Needs assistance/impaired Eating/Feeding: Independent Grooming: Set up, Min guard, Sitting Upper Body Bathing: Sitting, Minimal  assistance Lower Body Bathing: Sitting/lateral leans, Moderate assistance Upper Body Dressing : Sitting, Min guard Lower Body Dressing: Sitting/lateral leans, Moderate assistance General ADL Comments: pt reports performing LB ADL sitting and leaning side to side prior to admission    Mobility  Overal bed mobility: Needs Assistance Bed Mobility: Supine to Sit, Sit to Supine Supine to sit: Min assist, HOB elevated Sit to supine: Min guard General bed mobility comments: HOB 30 degrees with bil rails pt able to pull into long sitting with cues for sequence and safety with guarding for initial balance. pt then able to pivot 90 degrees in long sitting    Transfers  Overall transfer level: Needs assistance Equipment used: Sliding board Transfers: Lateral/Scoot Transfers, Government social research officer transfers: Min guard  Lateral/Scoot Transfers: Min assist, +2 safety/equipment General transfer comment: minguard with setup for transfers bed to recliner with A/P transfer pt performed bed<>chair then lateral transfer with drop arm toward right. Pt got "stuck" in the middle between surfaces with increased time and cues to sequence and min +2 for safety with transfer with pt ultimately only clearing right glute to bed and then lying on his side in the bed to complete transfer. PT fatigued with lateral transfer and unable to tolerate physical assist with transfer due to pain. With lateral transfers would continue sliding board    Ambulation / Gait / Stairs / Wheelchair Mobility  Ambulation/Gait General Gait Details: N/A    Posture / Balance Dynamic Sitting Balance Sitting balance - Comments: poor intially, increased to fair with practice Balance Overall balance assessment: Needs assistance Sitting-balance support: Bilateral upper extremity supported Sitting balance-Leahy Scale: Poor Sitting balance - Comments: poor intially, increased to fair with practice    Special needs/care  consideration BiPAP/CPAP  Yes CPM  Continuous Drip IV  Dialysis         Days  Life Vest  Oxygen  Special Bed  Trach Size  Wound Vac surgical incision wound VAC Skin surgical incision Bowel mgmt:  continent Bladder mgmt:  continent Diabetic mgmt:  yes Behavioral consideration   Chemo/radiation  Designated visitor is [name]   Previous Home Environment  Living Arrangements: Spouse/significant other  Lives With: Significant other Available Help at Discharge: Family, Available 24 hours/day Type of Home: House Home Layout: One level Home Access: Ramped entrance Bathroom Shower/Tub: Multimedia programmer: Handicapped height Bathroom Accessibility: Yes How Accessible: Accessible via wheelchair [place]: (none, was doing outpt until 03/2018)  Discharge Living Setting Plans for Discharge Living Setting: Patient's home, Lives with (comment)(Libby) Type of Home at Discharge: [facility] Discharge Home  Layout: One level Discharge Home Access: Ramped entrance Discharge Bathroom Shower/Tub: Walk-in shower Discharge Bathroom Toilet: Handicapped height Discharge Bathroom Accessibility: Yes How Accessible: Accessible via wheelchair Does the patient have any problems obtaining your medications?: No  Social/Family/Support Systems Contact Information: [place], significant other Anticipated Caregiver: Therapist, music Information: see above Ability/Limitations of Caregiver: no limits Caregiver Availability: 24/7 Discharge Plan Discussed with Primary Caregiver: Yes Is Caregiver In Agreement with Plan?: Yes Does Caregiver/Family have Issues with Lodging/Transportation while Pt is in Rehab?: No  Goals/Additional Needs Patient/Family Goal for Rehab: Mod I with PT and OT at wheelchair level Expected length of stay: ELOS 7 to 10 days Pt/Family Agrees to Admission and willing to participate: Yes Program Orientation Provided & Reviewed with Pt/Caregiver  Including Roles  & Responsibilities: Yes  Decrease burden of Care through IP rehab admission:  Possible need for SNF placement upon discharge:   Patient Condition: I have reviewed medical records from [facility] , spoken with CM, and patient and family member. I met with patient at the bedside for inpatient rehabilitation assessment.  Patient will benefit from ongoing PT and OT, can actively participate in 3 hours of therapy a day 5 days of the week, and can make measurable gains during the admission.  Patient will also benefit from the coordinated team approach during an Inpatient Acute Rehabilitation admission.  The patient will receive intensive therapy as well as Rehabilitation physician, nursing, social worker, and care management interventions.  Due to bladder management, bowel management, safety, skin/wound care, disease management, medication administration, pain management and patient education the patient requires 24 hour a day rehabilitation nursing.  The patient is currently min assist with mobility and basic ADLs.  Discharge setting and therapy post discharge at home with home health is anticipated.  Patient has agreed to participate in the Acute Inpatient Rehabilitation Program and will admit today.  Preadmission Screen Completed By:  [name], 12/14/2018 1:01 PM ______________________________________________________________________   Discussed status with Dr. Naaman Plummer  on  12/14/2018 at 1306 and received approval for admission today.  Admission Coordinator:  [name], RN, time  4128 Date  12/14/2018   Assessment/Plan: Diagnosis: new right AKA, prior left AKA 1. Does the need for close, 24 hr/day Medical supervision in concert with the patient's rehab needs make it unreasonable for this patient to be served in a less intensive setting? Yes 2. Co-Morbidities requiring supervision/potential complications: CAD, DM with polyneuropathy, OSA, obesity 3. Due  to bladder management, bowel management, safety, skin/wound care, disease management, medication administration, pain management and patient education, does the patient require 24 hr/day rehab nursing? Yes 4. Does the patient require coordinated care of a physician, rehab nurse, PT, OT, and SLP to address physical and functional deficits in the context of the above medical diagnosis(es)? Yes Addressing deficits in the following areas: balance, endurance, locomotion, strength, transferring, bowel/bladder control, bathing, dressing, feeding, grooming, toileting and psychosocial support 5. Can the patient actively participate in an intensive therapy program of at least 3 hrs of therapy 5 days a week? Yes 6. The potential for patient to make measurable gains while on inpatient rehab is excellent 7. Anticipated functional outcomes upon discharge from inpatient rehab: modified independent PT, modified independent OT, n/a SLP 8. Estimated rehab length of stay to reach the above functional goals is: 7-10 days 9. Anticipated discharge destination: Home 10. Overall Rehab/Functional Prognosis: excellent   MD Signature: Meredith Staggers, MD, [facility] 12/14/2018

## 2018-12-14 NOTE — Progress Notes (Signed)
Physical Therapy Treatment Patient Details Name: [name] MRN: [id number] DOB: [birth year] Today's Date: 12/14/2018    History of Present Illness Pt is a 68 y/o male s/p R transfemoral amputation with wound VAC application. PMH including but not limited to L transfemoral amputation in 2019, DM and PVD.    PT Comments    Pt very pleasant and eager to progress mobility. Pt with improved bed mobility and able to achieve long sitting with use of rails, pivot and perform A/P transfer with minguard. Upon education and instruction for home setup pt will still require lateral transfers for toileting and bathing and with OT attempted drop arm transfer however pt unable to fully complete without assist and will need to continue sliding board training for ADLs. Pt reports ease, comfort and mobility with use of A/P transfer and will continue to utilize for basic mobility.     Follow Up Recommendations  CIR     Equipment Recommendations  None recommended by PT    Recommendations for Other Services       Precautions / Restrictions Precautions Precautions: Fall Precaution Comments: wound VAC    Mobility  Bed Mobility Overal bed mobility: Needs Assistance Bed Mobility: Supine to Sit;Sit to Supine     Supine to sit: Min assist;HOB elevated     General bed mobility comments: HOB 30 degrees with bil rails pt able to pull into long sitting with cues for sequence and safety with guarding for initial balance. pt then able to pivot 90 degrees in long sitting  Transfers Overall transfer level: Needs assistance   Transfers: Lateral/Scoot Transfers;Anterior-Posterior Wellsite geologist transfers: Min guard  Lateral/Scoot Transfers: Metallurgist transfer comment: minguard with setup for transfers bed to recliner with A/P transfer pt performed bed<>chair then lateral transfer with drop arm toward right. Pt got "stuck" in the middle between surfaces  with increased time and cues to sequence and min +2 for safety with transfer with pt ultimately only clearing right glute to bed and then lying on his side in the bed to complete transfer. PT fatigued with lateral transfer and unable to tolerate physical assist with transfer due to pain. With lateral transfers would continue sliding board  Ambulation/Gait             General Gait Details: N/A   Stairs             Wheelchair Mobility    Modified Rankin (Stroke Patients Only)       Balance Overall balance assessment: Needs assistance Sitting-balance support: Bilateral upper extremity supported Sitting balance-Leahy Scale: Poor                                      Cognition Arousal/Alertness: Awake/alert Behavior During Therapy: WFL for tasks assessed/performed Overall Cognitive Status: Within Functional Limits for tasks assessed                                        Exercises      General Comments        Pertinent Vitals/Pain Faces Pain Scale: Hurts even more Pain Location: R residual limb with anterior lean Pain Descriptors / Indicators: Operative site guarding Pain Intervention(s): Limited activity within patient's tolerance;Repositioned;Patient requesting pain meds-RN notified;Monitored during session    Home  Living                      Prior Function            PT Goals (current goals can now be found in the care plan section) Progress towards PT goals: Progressing toward goals    Frequency    Min 4X/week      PT Plan Current plan remains appropriate;Frequency needs to be updated    Co-evaluation              AM-PAC PT "6 Clicks" Mobility   Outcome Measure  Help needed turning from your back to your side while in a flat bed without using bedrails?: A Little Help needed moving from lying on your back to sitting on the side of a flat bed without using bedrails?: A Little Help needed moving  to and from a bed to a chair (including a wheelchair)?: A Little Help needed standing up from a chair using your arms (e.g., wheelchair or bedside chair)?: Total Help needed to walk in hospital room?: Total Help needed climbing 3-5 steps with a railing? : Total 6 Click Score: 12    End of Session   Activity Tolerance: Patient tolerated treatment well Patient left: in bed;with call bell/phone within reach;with family/visitor present Nurse Communication: Mobility status;Precautions PT Visit Diagnosis: Other abnormalities of gait and mobility (R26.89);Pain     Time: [id number] PT Time Calculation (min) (ACUTE ONLY): 38 min  Charges:  $Therapeutic Activity: 23-37 mins                     Kailene Steinhart P, PT Acute Rehabilitation Services Pager: [phone] Office: [place] 12/14/2018, 12:35 PM

## 2018-12-14 NOTE — H&P (Signed)
Physical Medicine and Rehabilitation Admission H&P    CC: New R-BKA due to gangrenous changes and history of L-BKA   HPI:  [name] is a 68 year old male with history of CAD, T2DM--with diabetic polyneuropathy, PVD s/p L-BKA 12/2017 (did not get prosthesis yet due to Covid restrictions)  and right shin ulcers which was being managed on outpatient basis with close follow up. He continued to have progressive gangrenous changes with foul smelling drainage and was admitted on  12/11/18 for R-BKA by Dr. [name]. Post op with ABLA and reactive leucocytosis resolving. Therapy initiated and patient limited by pain with balance deficits and weakness. CIR recommended due to functional deficits.     Review of Systems  HENT: Positive for hearing loss. Negative for tinnitus.   Eyes: Negative for blurred vision and double vision.  Respiratory: Negative for cough and shortness of breath.   Cardiovascular: Negative for chest pain and palpitations.  Gastrointestinal: Positive for constipation.  Genitourinary: Negative for dysuria and urgency.  Musculoskeletal: Positive for joint pain (left shoulder), myalgias and neck pain. Negative for back pain.  Neurological: Positive for sensory change. Negative for dizziness and headaches.  Psychiatric/Behavioral: Negative for memory loss. The patient does not have insomnia.      Past Medical History:  Diagnosis Date  . Cellulitis and abscess of left lower extremity 12/20/2017  . Coronary atherosclerosis of unspecified type of vessel, native or graft   . Diabetes mellitus without complication ([place])    type 2  . Hemorrhage of rectum and anus   . History of kidney stones    passed stones  . Impotence of organic origin   . Internal hemorrhoids without mention of complication   . Left below-knee amputee ([place]) 02/23/2016  . Malignant neoplasm of prostate ([place])   . Mononeuritis of unspecified site   . Osteoarthrosis, unspecified whether generalized or  localized, unspecified site   . Other and unspecified hyperlipidemia   . Peripheral vascular disease ([place])   . Personal history of colonic polyps   . Personal history of diabetic foot ulcer    saw wound center, resolved 05/08/2010  . Personal history of gallstones   . Routine general medical examination at a health care facility   . Subacute osteomyelitis, right ankle and foot ([place])   . Type II or unspecified type diabetes mellitus with neurological manifestations, not stated as uncontrolled(250.60)   . Unspecified glaucoma(365.9)   . Unspecified sleep apnea    cpcp  . Unspecified venous (peripheral) insufficiency     Past Surgical History:  Procedure Laterality Date  . AMPUTATION Left 12/30/2012   Procedure: AMPUTATION RAY ;  Surgeon: Meredith Pel, MD;  Location: [facility];  Service: Orthopedics;  Laterality: Left;  LEFT GREAT TOE RAY AMPUTATION  . AMPUTATION Left 02/23/2016   Procedure: Left Below Knee Amputation;  Surgeon: [name], MD;  Location: [place];  Service: Orthopedics;  Laterality: Left;  . AMPUTATION Left 12/24/2017   Procedure: LEFT ABOVE KNEE AMPUTATION;  Surgeon: [name], MD;  Location: [place];  Service: Orthopedics;  Laterality: Left;  . AMPUTATION Right 12/24/2017   Procedure: RIGHT 5TH RAY AMPUTATION;  Surgeon: [name], MD;  Location: [place];  Service: Orthopedics;  Laterality: Right;  . AMPUTATION Right 12/11/2018   Procedure: RIGHT ABOVE KNEE AMPUTATION;  Surgeon: [name], MD;  Location: [place];  Service: Orthopedics;  Laterality: Right;  . APPENDECTOMY    . BASAL CELL CARCINOMA EXCISION  2/16   left forearm  . BELPHAROPTOSIS REPAIR    . CARDIAC CATHETERIZATION  1998   Negative  . CATARACT EXTRACTION Right 2017   then lid surgery  . COLONOSCOPY    . CORONARY ARTERY BYPASS GRAFT  09/2005   5 bypasses per patient, Post op AFIB  . EYE SURGERY    . FOOT BONE EXCISION Left 11/03/2013   DR DUDA   . I&D EXTREMITY Left 11/03/2013    Procedure: Left Foot Partial Bone Excision Cuboid and Medial Cuneiform, Wound Closures;  Surgeon: [name], MD;  Location: [facility];  Service: Orthopedics;  Laterality: Left;  . INSERTION PROSTATE RADIATION SEED  2009   RT and seeds for prostate cancer  . KIDNEY STONE SURGERY  04/1993  . RCA stents  04/2003   EF 55%  . RETINAL DETACHMENT SURGERY  2002-2003  . thrombosed vein  1993   Right leg    Family History  Problem Relation Age of Onset  . Lung cancer Father   . Multiple sclerosis Mother   . Heart attack Other        paternal aunts and uncles  . Peripheral vascular disease Maternal Grandfather        several amputations    Social History:  Widowed--lives with fiancee.  Retired Dealer. He reports that he quit smoking about 17 years ago. His smoking use included cigars and cigarettes. He has a 70.00 pack-year smoking history. He has never used smokeless tobacco. He reports previous alcohol use. He reports that he does not use drugs.   Allergies  Allergen Reactions  . Atorvastatin Other (See Comments)    myalgias  . Ezetimibe Other (See Comments)    Body ache  . Simvastatin Other (See Comments)    Body ache    Medications Prior to Admission  Medication Sig Dispense Refill  . ACCU-CHEK GUIDE test strip USE AS INSTRUCTED TO CHECK SUGAR 3 TIMES DAILY DX- E11.42 200 strip 8  . Ascorbic Acid (VITAMIN C) 1000 MG tablet Take 1,000 mg by mouth daily.    Marland Kitchen aspirin 325 MG tablet Take 325 mg by mouth daily.      . carvedilol (COREG) 25 MG tablet TAKE 1 TABLET TWICE DAILY (Patient taking differently: Take 25 mg by mouth 2 (two) times daily. ) 180 tablet 3  . Cholecalciferol (VITAMIN D3) 125 MCG (5000 UT) TABS Take 5,000 Units by mouth daily.    . DROPLET PEN NEEDLES 31G X 5 MM MISC USE TO INJECT INSULIN FOUR TIMES DAILY AS INSTRUCTED (FOR DIABETES) 400 each 1  . HOMEOPATHIC PRODUCTS NA Place 1 spray into the nose 2 (two) times daily as needed (nasal congestion.). Argentyn 23     .  insulin aspart (NOVOLOG FLEXPEN) 100 UNIT/ML FlexPen INJECT 16-26 UNITS SUBCUTANEOUSLY THREE TIMES DAILY WITH MEALS (Patient taking differently: Inject 25-30 Units into the skin 3 (three) times daily with meals. Sliding Scale Insulin) 20 pen 3  . Insulin Glargine, 2 Unit Dial, (TOUJEO MAX SOLOSTAR) 300 UNIT/ML SOPN Inject 55 Units into the skin at bedtime. (Patient taking differently: Inject 60 Units into the skin at bedtime. ) 9 pen 3  . losartan-hydrochlorothiazide (HYZAAR) 50-12.5 MG tablet TAKE 1 TABLET EVERY DAY (Patient taking differently: Take 1 tablet by mouth daily. ) 90 tablet 3  . metFORMIN (GLUCOPHAGE) 1000 MG tablet TAKE 1 TABLET EVERY DAY (Patient taking differently: Take 1,000 mg by mouth every evening. ) 90 tablet 2  . Multiple Vitamin (MULTIVITAMIN WITH MINERALS) TABS  tablet Take 1 tablet by mouth daily. Centrum Silver for Men 50+    . mupirocin ointment (BACTROBAN) 2 % APPLY 1 APPLICATION TOPICALLY DAILY. 22 g 6  . naproxen sodium (ALEVE) 220 MG tablet Take 440-660 mg by mouth 2 (two) times daily as needed (pain.).    Marland Kitchen nitroGLYCERIN (NITRODUR - DOSED IN MG/24 HR) 0.4 mg/hr patch Place 1 patch (0.4 mg total) onto the skin daily. 30 patch 12  . pentoxifylline (TRENTAL) 400 MG CR tablet Take 1 tablet (400 mg total) by mouth 3 (three) times daily with meals. 90 tablet 11  . potassium citrate (UROCIT-K) 10 MEQ (1080 MG) SR tablet Take 20 mEq by mouth 2 (two) times daily.    . Probiotic Product (PROBIOTIC PO) Take 1 capsule by mouth daily. Probulin Probiotic Supplement    . Turmeric 500 MG CAPS Take 500 mg by mouth daily.    Marland Kitchen zinc gluconate 50 MG tablet Take 50 mg by mouth 2 (two) times daily.     . rosuvastatin (CRESTOR) 5 MG tablet Take 1 tablet (5 mg total) by mouth once a week. (Patient not taking: Reported on 12/08/2018) 12 tablet 3    Drug Regimen Review  Drug regimen was reviewed and remains appropriate with no significant issues identified  Home: Home Living Family/patient  expects to be discharged to:: Private residence Living Arrangements: Spouse/significant other Available Help at Discharge: Family, Available 24 hours/day Type of Home: House Home Access: Ramped entrance Home Layout: One level Bathroom Shower/Tub: Multimedia programmer: Handicapped height Home Equipment: Bedside commode, Wheelchair - manual, Other (comment), Grab bars - tub/shower(sliding board, drop arm BSC--bariatric)   Functional History: Prior Function Level of Independence: Independent with assistive device(s) Comments: at a w/c level; mod I for transfers with sliding board; independent with bathing/dressing  Functional Status:  Mobility: Bed Mobility Overal bed mobility: Needs Assistance Bed Mobility: Supine to Sit Supine to sit: Mod assist Sit to supine: Min guard General bed mobility comments: mod A to rise into seated position at EOB, once up Pt able to progress hips forward. HOB elevated and use of bed rails.  Transfers Overall transfer level: Needs assistance Equipment used: Sliding board Transfers: Lateral/Scoot Transfers  Lateral/Scoot Transfers: Mod assist, +2 safety/equipment General transfer comment: Pt was able to perform lateral scoot to drop arm recliner with mod A +2 for safety. VC for hand placement and technique. Pt stated he might like to try an AP transfer next time.      ADL: ADL Overall ADL's : Needs assistance/impaired Eating/Feeding: Independent Grooming: Set up, Min guard, Sitting Upper Body Bathing: Sitting, Minimal assistance Lower Body Bathing: Sitting/lateral leans, Moderate assistance Upper Body Dressing : Sitting, Min guard Lower Body Dressing: Sitting/lateral leans, Moderate assistance General ADL Comments: pt reports performing LB ADL sitting and leaning side to side prior to admission  Cognition: Cognition Overall Cognitive Status: Within Functional Limits for tasks assessed Orientation Level: Oriented X4 Cognition  Arousal/Alertness: Awake/alert Behavior During Therapy: WFL for tasks assessed/performed Overall Cognitive Status: Within Functional Limits for tasks assessed   Blood pressure 132/68, pulse 90, temperature 98.4 F (36.9 C), temperature source Oral, resp. rate 16, height 6' 2.02" (1.88 m), weight 117.9 kg, SpO2 100 %. Physical Exam  Nursing note and vitals reviewed. Constitutional: He is oriented to person, place, and time. He appears well-developed and well-nourished. No distress.  Morbidly obese  HENT:  Head: Normocephalic and atraumatic.  Eyes: Pupils are equal, round, and reactive to light. Right eye exhibits no  discharge.  Right lower lid lag.   Neck: Normal range of motion. No thyromegaly present.  Cardiovascular: Normal rate and regular rhythm. Exam reveals no friction rub.  No murmur heard. Respiratory: Effort normal. No stridor. No respiratory distress. He has no wheezes. He has no rales.  GI: Soft. He exhibits no distension. There is no abdominal tenderness.  Musculoskeletal:     Comments: RLE in limb guard, vac. Left AKA well formed.   Neurological: He is alert and oriented to person, place, and time. No cranial nerve deficit.  UE 5/5 prox to distal. Able to lift right leg slightly off bed. LLE 4/5. No sensory deficits in residual LE's.   Skin: No rash noted. He is not diaphoretic. No erythema.  RLE with vac in place  Psychiatric: He has a normal mood and affect. His behavior is normal. Judgment and thought content normal.  Very pleasant, a little verbose    Results for orders placed or performed during the hospital encounter of 12/11/18 (from the past 48 hour(s))  Glucose, capillary     Status: Abnormal   Collection Time: 12/12/18 11:54 AM  Result Value Ref Range   Glucose-Capillary 362 (H) 70 - 99 mg/dL  Glucose, capillary     Status: Abnormal   Collection Time: 12/12/18  4:32 PM  Result Value Ref Range   Glucose-Capillary 270 (H) 70 - 99 mg/dL  Glucose, capillary      Status: Abnormal   Collection Time: 12/12/18  8:59 PM  Result Value Ref Range   Glucose-Capillary 177 (H) 70 - 99 mg/dL  CBC     Status: Abnormal   Collection Time: 12/13/18  6:28 AM  Result Value Ref Range   WBC 10.5 4.0 - 10.5 K/uL   RBC 4.09 (L) 4.22 - 5.81 MIL/uL   Hemoglobin 11.1 (L) 13.0 - 17.0 g/dL   HCT 34.0 (L) 39.0 - 52.0 %   MCV 83.1 80.0 - 100.0 fL   MCH 27.1 26.0 - 34.0 pg   MCHC 32.6 30.0 - 36.0 g/dL   RDW 16.4 (H) 11.5 - 15.5 %   Platelets 257 150 - 400 K/uL   nRBC 0.0 0.0 - 0.2 %    Comment: Performed at [facility], [address] [place], [place] [address]  Glucose, capillary     Status: Abnormal   Collection Time: 12/13/18  6:34 AM  Result Value Ref Range   Glucose-Capillary 138 (H) 70 - 99 mg/dL  Glucose, capillary     Status: Abnormal   Collection Time: 12/13/18 11:37 AM  Result Value Ref Range   Glucose-Capillary 228 (H) 70 - 99 mg/dL  Glucose, capillary     Status: Abnormal   Collection Time: 12/13/18  4:46 PM  Result Value Ref Range   Glucose-Capillary 222 (H) 70 - 99 mg/dL  Glucose, capillary     Status: Abnormal   Collection Time: 12/13/18  8:28 PM  Result Value Ref Range   Glucose-Capillary 216 (H) 70 - 99 mg/dL  Glucose, capillary     Status: Abnormal   Collection Time: 12/14/18  6:12 AM  Result Value Ref Range   Glucose-Capillary 185 (H) 70 - 99 mg/dL   No results found.     Medical Problem List and Plan: 1.  Functional and mobility deficits secondary to right AKA (hx of left AKA)  -patient may not yet shower  -ELOS/Goals: 7-10 days, mod I goals at w/c level  -admit to inpatient rehab 2.  Antithrombotics: -DVT/anticoagulation:  Pharmaceutical: Lovenox  -antiplatelet therapy: ASA 3. Pain Management: oxycodone or tylenol PRN.  4. Mood: LCSW to follow for evaluation and support.   -antipsychotic agents: N/A 5. Neuropsych: This patient is capable of making decisions on his own behalf. 6. Skin/Wound Care: Wound VAC for 7 days  then apply appropriate dressing based on wound appearance.  7. Fluids/Electrolytes/Nutrition: Monitor I/O--discontinue IVF. Check lytes in am.  8. T2DM: Monitor BS ac/hs. On metformin 1000 mg bid with Tujeo  40 units at bedtime.  -adjust regimen as needed  9. HTN: Monitor BP tid--continue Cozaar/HCTZ  Daily.  10. OSA: continue to use CPAP  11. H/o CAD: On ASA and coreg.  12. Mild constipation: augment bowel regimen on admit       Bary Leriche, PA-C 12/14/2018

## 2018-12-14 NOTE — TOC Transition Note (Signed)
Transition of Care [facility]) - CM/SW Discharge Note   Patient Details  Name: [name] MRN: [id number] Date of Birth: 1950-05-04  [facility] CM/SW Contact:  Midge Minium MSN, RN, NCM-BC, ACM-RN [phone] Phone Number: 12/14/2018, 12:04 PM   Clinical Narrative:    Patient has been accepted to CIR and is medically to transition today. Rehab [facility] following with the patient and his significant other agreeable to admission to CIR. No further needs from CM.   Final next level of care: IP Rehab Facility Barriers to Discharge: No Barriers Identified   Patient Goals and CMS Choice Patient states their goals for this hospitalization and ongoing recovery are:: "to be able to perform transfers" CMS Medicare.gov Compare Post Acute Care list provided to:: Patient(Provided by Rehab [facility]) Choice offered to / list presented to : Patient(Provided by Rehab [facility])   Discharge Plan and Services                DME Arranged: N/A DME Agency: NA       HH Arranged: NA HH Agency: NA        Social Determinants of Health (SDOH) Interventions     Readmission Risk Interventions No flowsheet data found.

## 2018-12-14 NOTE — H&P (Signed)
Physical Medicine and Rehabilitation Admission H&P     CC: New R-BKA due to gangrenous changes and history of L-BKA     HPI:  [name] is a 68 year old male with history of CAD, T2DM--with diabetic polyneuropathy, PVD s/p L-BKA 12/2017 (did not get prosthesis yet due to Covid restrictions)  and right shin ulcers which was being managed on outpatient basis with close follow up. He continued to have progressive gangrenous changes with foul smelling drainage and was admitted on  12/11/18 for R-BKA by Dr. [name]. Post op with ABLA and reactive leucocytosis resolving. Therapy initiated and patient limited by pain with balance deficits and weakness. CIR recommended due to functional deficits.       Review of Systems  HENT: Positive for hearing loss. Negative for tinnitus.   Eyes: Negative for blurred vision and double vision.  Respiratory: Negative for cough and shortness of breath.   Cardiovascular: Negative for chest pain and palpitations.  Gastrointestinal: Positive for constipation.  Genitourinary: Negative for dysuria and urgency.  Musculoskeletal: Positive for joint pain (left shoulder), myalgias and neck pain. Negative for back pain.  Neurological: Positive for sensory change. Negative for dizziness and headaches.  Psychiatric/Behavioral: Negative for memory loss. The patient does not have insomnia.           Past Medical History:  Diagnosis Date  . Cellulitis and abscess of left lower extremity 12/20/2017  . Coronary atherosclerosis of unspecified type of vessel, native or graft    . Diabetes mellitus without complication ([place])      type 2  . Hemorrhage of rectum and anus    . History of kidney stones      passed stones  . Impotence of organic origin    . Internal hemorrhoids without mention of complication    . Left below-knee amputee ([place]) 02/23/2016  . Malignant neoplasm of prostate ([place])    . Mononeuritis of unspecified site    . Osteoarthrosis, unspecified whether  generalized or localized, unspecified site    . Other and unspecified hyperlipidemia    . Peripheral vascular disease ([place])    . Personal history of colonic polyps    . Personal history of diabetic foot ulcer      saw wound center, resolved 05/08/2010  . Personal history of gallstones    . Routine general medical examination at a health care facility    . Subacute osteomyelitis, right ankle and foot ([place])    . Type II or unspecified type diabetes mellitus with neurological manifestations, not stated as uncontrolled(250.60)    . Unspecified glaucoma(365.9)    . Unspecified sleep apnea      cpcp  . Unspecified venous (peripheral) insufficiency             Past Surgical History:  Procedure Laterality Date  . AMPUTATION Left 12/30/2012    Procedure: AMPUTATION RAY ;  Surgeon: Meredith Pel, MD;  Location: [facility];  Service: Orthopedics;  Laterality: Left;  LEFT GREAT TOE RAY AMPUTATION  . AMPUTATION Left 02/23/2016    Procedure: Left Below Knee Amputation;  Surgeon: [name], MD;  Location: [place];  Service: Orthopedics;  Laterality: Left;  . AMPUTATION Left 12/24/2017    Procedure: LEFT ABOVE KNEE AMPUTATION;  Surgeon: [name], MD;  Location: [place];  Service: Orthopedics;  Laterality: Left;  . AMPUTATION Right 12/24/2017    Procedure: RIGHT 5TH RAY AMPUTATION;  Surgeon: [name], MD;  Location: [facility];  Service: Orthopedics;  Laterality: Right;  . AMPUTATION Right 12/11/2018    Procedure: RIGHT ABOVE KNEE AMPUTATION;  Surgeon: [name], MD;  Location: [place];  Service: Orthopedics;  Laterality: Right;  . APPENDECTOMY      . BASAL CELL CARCINOMA EXCISION   2/16    left forearm  . BELPHAROPTOSIS REPAIR      . CARDIAC CATHETERIZATION   1998    Negative  . CATARACT EXTRACTION Right 2017    then lid surgery  . COLONOSCOPY      . CORONARY ARTERY BYPASS GRAFT   09/2005    5 bypasses per patient, Post op AFIB  . EYE SURGERY      . FOOT BONE EXCISION Left 11/03/2013     DR DUDA   . I&D EXTREMITY Left 11/03/2013    Procedure: Left Foot Partial Bone Excision Cuboid and Medial Cuneiform, Wound Closures;  Surgeon: [name], MD;  Location: [facility];  Service: Orthopedics;  Laterality: Left;  . INSERTION PROSTATE RADIATION SEED   2009    RT and seeds for prostate cancer  . KIDNEY STONE SURGERY   04/1993  . RCA stents   04/2003    EF 55%  . RETINAL DETACHMENT SURGERY   2002-2003  . thrombosed vein   1993    Right leg           Family History  Problem Relation Age of Onset  . Lung cancer Father    . Multiple sclerosis Mother    . Heart attack Other          paternal aunts and uncles  . Peripheral vascular disease Maternal Grandfather          several amputations      Social History:  Widowed--lives with fiancee.  Retired Dealer. He reports that he quit smoking about 17 years ago. His smoking use included cigars and cigarettes. He has a 70.00 pack-year smoking history. He has never used smokeless tobacco. He reports previous alcohol use. He reports that he does not use drugs.          Allergies  Allergen Reactions  . Atorvastatin Other (See Comments)      myalgias  . Ezetimibe Other (See Comments)      Body ache  . Simvastatin Other (See Comments)      Body ache            Medications Prior to Admission  Medication Sig Dispense Refill  . ACCU-CHEK GUIDE test strip USE AS INSTRUCTED TO CHECK SUGAR 3 TIMES DAILY DX- E11.42 200 strip 8  . Ascorbic Acid (VITAMIN C) 1000 MG tablet Take 1,000 mg by mouth daily.      Marland Kitchen aspirin 325 MG tablet Take 325 mg by mouth daily.        . carvedilol (COREG) 25 MG tablet TAKE 1 TABLET TWICE DAILY (Patient taking differently: Take 25 mg by mouth 2 (two) times daily. ) 180 tablet 3  . Cholecalciferol (VITAMIN D3) 125 MCG (5000 UT) TABS Take 5,000 Units by mouth daily.      . DROPLET PEN NEEDLES 31G X 5 MM MISC USE TO INJECT INSULIN FOUR TIMES DAILY AS INSTRUCTED (FOR DIABETES) 400 each 1  . HOMEOPATHIC PRODUCTS  NA Place 1 spray into the nose 2 (two) times daily as needed (nasal congestion.). Argentyn 23       . insulin aspart (NOVOLOG FLEXPEN) 100 UNIT/ML FlexPen INJECT 16-26 UNITS SUBCUTANEOUSLY THREE TIMES DAILY WITH  MEALS (Patient taking differently: Inject 25-30 Units into the skin 3 (three) times daily with meals. Sliding Scale Insulin) 20 pen 3  . Insulin Glargine, 2 Unit Dial, (TOUJEO MAX SOLOSTAR) 300 UNIT/ML SOPN Inject 55 Units into the skin at bedtime. (Patient taking differently: Inject 60 Units into the skin at bedtime. ) 9 pen 3  . losartan-hydrochlorothiazide (HYZAAR) 50-12.5 MG tablet TAKE 1 TABLET EVERY DAY (Patient taking differently: Take 1 tablet by mouth daily. ) 90 tablet 3  . metFORMIN (GLUCOPHAGE) 1000 MG tablet TAKE 1 TABLET EVERY DAY (Patient taking differently: Take 1,000 mg by mouth every evening. ) 90 tablet 2  . Multiple Vitamin (MULTIVITAMIN WITH MINERALS) TABS tablet Take 1 tablet by mouth daily. Centrum Silver for Men 50+      . mupirocin ointment (BACTROBAN) 2 % APPLY 1 APPLICATION TOPICALLY DAILY. 22 g 6  . naproxen sodium (ALEVE) 220 MG tablet Take 440-660 mg by mouth 2 (two) times daily as needed (pain.).      Marland Kitchen nitroGLYCERIN (NITRODUR - DOSED IN MG/24 HR) 0.4 mg/hr patch Place 1 patch (0.4 mg total) onto the skin daily. 30 patch 12  . pentoxifylline (TRENTAL) 400 MG CR tablet Take 1 tablet (400 mg total) by mouth 3 (three) times daily with meals. 90 tablet 11  . potassium citrate (UROCIT-K) 10 MEQ (1080 MG) SR tablet Take 20 mEq by mouth 2 (two) times daily.      . Probiotic Product (PROBIOTIC PO) Take 1 capsule by mouth daily. Probulin Probiotic Supplement      . Turmeric 500 MG CAPS Take 500 mg by mouth daily.      Marland Kitchen zinc gluconate 50 MG tablet Take 50 mg by mouth 2 (two) times daily.       . rosuvastatin (CRESTOR) 5 MG tablet Take 1 tablet (5 mg total) by mouth once a week. (Patient not taking: Reported on 12/08/2018) 12 tablet 3      Drug Regimen Review  Drug  regimen was reviewed and remains appropriate with no significant issues identified   Home: Home Living Family/patient expects to be discharged to:: Private residence Living Arrangements: Spouse/significant other Available Help at Discharge: Family, Available 24 hours/day Type of Home: House Home Access: Ramped entrance Home Layout: One level Bathroom Shower/Tub: Multimedia programmer: Handicapped height Home Equipment: Bedside commode, Wheelchair - manual, Other (comment), Grab bars - tub/shower(sliding board, drop arm BSC--bariatric)   Functional History: Prior Function Level of Independence: Independent with assistive device(s) Comments: at a w/c level; mod I for transfers with sliding board; independent with bathing/dressing   Functional Status:  Mobility: Bed Mobility Overal bed mobility: Needs Assistance Bed Mobility: Supine to Sit Supine to sit: Mod assist Sit to supine: Min guard General bed mobility comments: mod A to rise into seated position at EOB, once up Pt able to progress hips forward. HOB elevated and use of bed rails.  Transfers Overall transfer level: Needs assistance Equipment used: Sliding board Transfers: Lateral/Scoot Transfers  Lateral/Scoot Transfers: Mod assist, +2 safety/equipment General transfer comment: Pt was able to perform lateral scoot to drop arm recliner with mod A +2 for safety. VC for hand placement and technique. Pt stated he might like to try an AP transfer next time.   ADL: ADL Overall ADL's : Needs assistance/impaired Eating/Feeding: Independent Grooming: Set up, Min guard, Sitting Upper Body Bathing: Sitting, Minimal assistance Lower Body Bathing: Sitting/lateral leans, Moderate assistance Upper Body Dressing : Sitting, Min guard Lower Body Dressing: Sitting/lateral leans, Moderate assistance  General ADL Comments: pt reports performing LB ADL sitting and leaning side to side prior to admission   Cognition: Cognition  Overall Cognitive Status: Within Functional Limits for tasks assessed Orientation Level: Oriented X4 Cognition Arousal/Alertness: Awake/alert Behavior During Therapy: WFL for tasks assessed/performed Overall Cognitive Status: Within Functional Limits for tasks assessed     Blood pressure 132/68, pulse 90, temperature 98.4 F (36.9 C), temperature source Oral, resp. rate 16, height 6' 2.02" (1.88 m), weight 117.9 kg, SpO2 100 %. Physical Exam  Nursing note and vitals reviewed. Constitutional: He is oriented to person, place, and time. He appears well-developed and well-nourished. No distress.  Morbidly obese  HENT:  Head: Normocephalic and atraumatic.  Eyes: Pupils are equal, round, and reactive to light. Right eye exhibits no discharge.  Right lower lid lag.   Neck: Normal range of motion. No thyromegaly present.  Cardiovascular: Normal rate and regular rhythm. Exam reveals no friction rub.  No murmur heard. Respiratory: Effort normal. No stridor. No respiratory distress. He has no wheezes. He has no rales.  GI: Soft. He exhibits no distension. There is no abdominal tenderness.  Musculoskeletal:     Comments: RLE in limb guard, vac. Left AKA well formed.   Neurological: He is alert and oriented to person, place, and time. No cranial nerve deficit.  UE 5/5 prox to distal. Able to lift right leg slightly off bed. LLE 4/5. No sensory deficits in residual LE's.   Skin: No rash noted. He is not diaphoretic. No erythema.  RLE with vac in place  Psychiatric: He has a normal mood and affect. His behavior is normal. Judgment and thought content normal.  Very pleasant, a little verbose      Lab Results Last 48 Hours        Results for orders placed or performed during the hospital encounter of 12/11/18 (from the past 48 hour(s))  Glucose, capillary     Status: Abnormal    Collection Time: 12/12/18 11:54 AM  Result Value Ref Range    Glucose-Capillary 362 (H) 70 - 99 mg/dL  Glucose,  capillary     Status: Abnormal    Collection Time: 12/12/18  4:32 PM  Result Value Ref Range    Glucose-Capillary 270 (H) 70 - 99 mg/dL  Glucose, capillary     Status: Abnormal    Collection Time: 12/12/18  8:59 PM  Result Value Ref Range    Glucose-Capillary 177 (H) 70 - 99 mg/dL  CBC     Status: Abnormal    Collection Time: 12/13/18  6:28 AM  Result Value Ref Range    WBC 10.5 4.0 - 10.5 K/uL    RBC 4.09 (L) 4.22 - 5.81 MIL/uL    Hemoglobin 11.1 (L) 13.0 - 17.0 g/dL    HCT 34.0 (L) 39.0 - 52.0 %    MCV 83.1 80.0 - 100.0 fL    MCH 27.1 26.0 - 34.0 pg    MCHC 32.6 30.0 - 36.0 g/dL    RDW 16.4 (H) 11.5 - 15.5 %    Platelets 257 150 - 400 K/uL    nRBC 0.0 0.0 - 0.2 %      Comment: Performed at [facility], [address], [place], [place] [address]  Glucose, capillary     Status: Abnormal    Collection Time: 12/13/18  6:34 AM  Result Value Ref Range    Glucose-Capillary 138 (H) 70 - 99 mg/dL  Glucose, capillary     Status: Abnormal  Collection Time: 12/13/18 11:37 AM  Result Value Ref Range    Glucose-Capillary 228 (H) 70 - 99 mg/dL  Glucose, capillary     Status: Abnormal    Collection Time: 12/13/18  4:46 PM  Result Value Ref Range    Glucose-Capillary 222 (H) 70 - 99 mg/dL  Glucose, capillary     Status: Abnormal    Collection Time: 12/13/18  8:28 PM  Result Value Ref Range    Glucose-Capillary 216 (H) 70 - 99 mg/dL  Glucose, capillary     Status: Abnormal    Collection Time: 12/14/18  6:12 AM  Result Value Ref Range    Glucose-Capillary 185 (H) 70 - 99 mg/dL     Imaging Results (Last 48 hours)  No results found.           Medical Problem List and Plan: 1.  Functional and mobility deficits secondary to right AKA (hx of left AKA)             -patient may not yet shower             -ELOS/Goals: 7-10 days, mod I goals at w/c level             -admit to inpatient rehab 2.  Antithrombotics: -DVT/anticoagulation:  Pharmaceutical: Lovenox              -antiplatelet therapy: ASA 3. Pain Management: oxycodone or tylenol PRN.  4. Mood: LCSW to follow for evaluation and support.              -antipsychotic agents: N/A 5. Neuropsych: This patient is capable of making decisions on his own behalf. 6. Skin/Wound Care: Wound VAC for 7 days then apply appropriate dressing based on wound appearance.  7. Fluids/Electrolytes/Nutrition: Monitor I/O--discontinue IVF. Check lytes in am.  8. T2DM: Monitor BS ac/hs. On metformin 1000 mg bid with Tujeo  40 units at bedtime.             -adjust regimen as needed  9. HTN: Monitor BP tid--continue Cozaar/HCTZ  Daily.  10. OSA: continue to use CPAP  11. H/o CAD: On ASA and coreg.  12. Mild constipation: augment bowel regimen on admit        Bary Leriche, PA-C 12/14/2018   I have personally performed a face to face diagnostic evaluation of this patient and formulated the key components of the plan.  Additionally, I have personally reviewed laboratory data, imaging studies, as well as relevant notes and concur with the physician assistant's documentation above.  The patient's status has not changed from the original H&P.  Any changes in documentation from the acute care chart have been noted above.  Meredith Staggers, MD, [facility]

## 2018-12-14 NOTE — Progress Notes (Addendum)
Occupational Therapy Assessment and Plan  Patient Details  Name: [name] MRN: [id number] Date of Birth: 1950/04/12  OT Diagnosis: acute pain and above knee amputation Rehab Potential: Rehab Potential (ACUTE ONLY): Excellent ELOS: 5-7 days   Today's Date: 12/15/2018 OT Individual Time: 1497-0263 OT Individual Time Calculation (min): 59 min     Problem List:  Patient Active Problem List   Diagnosis Date Noted  . Above knee amputation of right lower extremity ([place]) 12/14/2018  . Atherosclerosis of native artery of right leg with gangrene ([place]) 12/11/2018  . Hypoalbuminemia due to protein-calorie malnutrition ([place])   . Type 2 diabetes mellitus with peripheral neuropathy (HCC)   . Left above-knee amputee ([place]) 12/26/2017  . Unilateral AKA, left ([place])   . History of complete ray amputation of fifth toe of right foot ([place])   . Postoperative pain   . Essential hypertension   . Atherosclerosis of native arteries of extremities with gangrene, right leg ([place]) 02/19/2017  . Chronic renal disease, stage III 02/19/2017  . Ulcer of toe of right foot, limited to breakdown of skin ([place]) 10/11/2016  . Type 2 diabetes mellitus with diabetic polyneuropathy, with long-term current use of insulin ([place]) 03/29/2015  . Advance directive discussed with patient 02/07/2014  . Normocytic anemia 04/01/2013  . Diabetic Charcot foot ([place]) 04/01/2013  . Routine general medical examination at a health care facility 12/25/2010  . PERSONAL HX COLONIC POLYPS 11/22/2009  . Venous (peripheral) insufficiency 06/29/2008  . Osteoarthritis, multiple sites 04/03/2007  . ERECTILE DYSFUNCTION, ORGANIC 06/13/2006  . Hyperlipemia 06/11/2006  . GLAUCOMA 06/11/2006  . Coronary atherosclerosis of native coronary artery 06/11/2006  . Obstructive sleep apnea 06/11/2006    Past Medical History:  Past Medical History:  Diagnosis Date  . Cellulitis and abscess of left lower extremity 12/20/2017  . Coronary  atherosclerosis of unspecified type of vessel, native or graft   . Diabetes mellitus without complication ([place])    type 2  . Hemorrhage of rectum and anus   . History of kidney stones    passed stones  . Impotence of organic origin   . Internal hemorrhoids without mention of complication   . Left below-knee amputee ([place]) 02/23/2016  . Malignant neoplasm of prostate ([place])   . Mononeuritis of unspecified site   . Osteoarthrosis, unspecified whether generalized or localized, unspecified site   . Other and unspecified hyperlipidemia   . Peripheral vascular disease ([place])   . Personal history of colonic polyps   . Personal history of diabetic foot ulcer    saw wound center, resolved 05/08/2010  . Personal history of gallstones   . Routine general medical examination at a health care facility   . Subacute osteomyelitis, right ankle and foot ([place])   . Type II or unspecified type diabetes mellitus with neurological manifestations, not stated as uncontrolled(250.60)   . Unspecified glaucoma(365.9)   . Unspecified sleep apnea    cpcp  . Unspecified venous (peripheral) insufficiency    Past Surgical History:  Past Surgical History:  Procedure Laterality Date  . AMPUTATION Left 12/30/2012   Procedure: AMPUTATION RAY ;  Surgeon: Meredith Pel, MD;  Location: [facility];  Service: Orthopedics;  Laterality: Left;  LEFT GREAT TOE RAY AMPUTATION  . AMPUTATION Left 02/23/2016   Procedure: Left Below Knee Amputation;  Surgeon: [name], MD;  Location: [facility];  Service: Orthopedics;  Laterality: Left;  . AMPUTATION Left 12/24/2017   Procedure: LEFT ABOVE KNEE AMPUTATION;  Surgeon: [name],  MD;  Location: [facility];  Service: Orthopedics;  Laterality: Left;  . AMPUTATION Right 12/24/2017   Procedure: RIGHT 5TH RAY AMPUTATION;  Surgeon: [name], MD;  Location: [facility];  Service: Orthopedics;  Laterality: Right;  . AMPUTATION Right 12/11/2018   Procedure: RIGHT ABOVE KNEE AMPUTATION;  Surgeon:  [name], MD;  Location: [facility];  Service: Orthopedics;  Laterality: Right;  . APPENDECTOMY    . BASAL CELL CARCINOMA EXCISION  2/16   left forearm  . BELPHAROPTOSIS REPAIR    . CARDIAC CATHETERIZATION  1998   Negative  . CATARACT EXTRACTION Right 2017   then lid surgery  . COLONOSCOPY    . CORONARY ARTERY BYPASS GRAFT  09/2005   5 bypasses per patient, Post op AFIB  . EYE SURGERY    . FOOT BONE EXCISION Left 11/03/2013   DR DUDA   . I&D EXTREMITY Left 11/03/2013   Procedure: Left Foot Partial Bone Excision Cuboid and Medial Cuneiform, Wound Closures;  Surgeon: [name], MD;  Location: [facility];  Service: Orthopedics;  Laterality: Left;  . INSERTION PROSTATE RADIATION SEED  2009   RT and seeds for prostate cancer  . KIDNEY STONE SURGERY  04/1993  . RCA stents  04/2003   EF 55%  . RETINAL DETACHMENT SURGERY  2002-2003  . thrombosed vein  1993   Right leg    Assessment & Plan Clinical Impression: Patient is a 68 y.o. year old male with history of CAD, T2DM--with diabetic polyneuropathy, PVD s/p L-BKA 12/2017 (did not get prosthesis yet due to Covid restrictions)  and right shin ulcers which was being managed on outpatient basis with close follow up. He continued to have progressive gangrenous changes with foul smelling drainage and was admitted on  12/11/18 for R-BKA by Dr. [name]. Post op with ABLA and reactive leucocytosis resolving. Therapy initiated and patient limited by pain with balance deficits and weakness. Patient transferred to CIR on 12/14/2018 .    Patient currently requires min with basic self-care skills secondary to muscle weakness, decreased cardiorespiratoy endurance and decreased sitting balance and decreased balance strategies.  Prior to hospitalization, patient could complete BADL with modified independent .  Patient will benefit from skilled intervention to increase independence with basic self-care skills prior to discharge home with care partner.  Anticipate  patient will require intermittent supervision and follow up home health.  OT - End of Session Endurance Deficit: Yes Endurance Deficit Description: Intermittent rest breaks OT Assessment Rehab Potential (ACUTE ONLY): Excellent OT Patient demonstrates impairments in the following area(s): Balance;Endurance;Pain OT Basic ADL's Functional Problem(s): Grooming;Bathing;Dressing;Toileting OT Transfers Functional Problem(s): Toilet;Tub/Shower OT Additional Impairment(s): None OT Plan OT Intensity: Minimum of 1-2 x/day, 45 to 90 minutes OT Frequency: 5 out of 7 days OT Duration/Estimated Length of Stay: 5-7 days OT Treatment/Interventions: Balance/vestibular training;Community reintegration;Discharge planning;DME/adaptive equipment instruction;Pain management;Patient/family education;Psychosocial support;Self Care/advanced ADL retraining;Therapeutic Activities;Therapeutic Exercise;UE/LE Strength taining/ROM;Wheelchair propulsion/positioning OT Self Feeding Anticipated Outcome(s): Independet OT Basic Self-Care Anticipated Outcome(s): Supervision/Mod I OT Toileting Anticipated Outcome(s): Mod I OT Bathroom Transfers Anticipated Outcome(s): Supervision OT Recommendation Patient destination: Home Follow Up Recommendations: Home health OT Equipment Recommended: To be determined   Skilled Therapeutic Intervention OT eval completed addressing rehab process, OT purpose, POC, ELOS, and goals. Pt reported need to go to the bathroom. OT issued wide drop arm BSC. Pt completed A/P transfer from bed to [facility] with min A. Pt with successful BM and voided bladder. Worked on toileting stratgies with pt able to lift  LLE and huke hip enough to perform hygiene. Pt completed bathing sitting on BSC with min set-up A. Pt then donned shirt seated on BSC. A/P transfer back to bed with min A. Pt needed OT assist to thread pant leg through wound vac. Pt laid back in bed and rolled L and R to pull pants up over hips. Pt left  semi-reclined in bed at end of session with bed alarm on, call bell in reach, and needs met.   OT Evaluation Precautions/Restrictions  Precautions Precautions: Fall Precaution Comments: wound VAC Restrictions Weight Bearing Restrictions: Yes RLE Weight Bearing: Non weight bearing Pain  pt reports pain is manageable, but did not state a number, repositioned for comfort Home Living/Prior [place] expects to be discharged to:: Private residence Living Arrangements: Spouse/significant other Available Help at Discharge: Family, Available 24 hours/day Type of Home: House Home Access: [place]: One level Bathroom Shower/Tub: Multimedia programmer: Handicapped height Bathroom Accessibility: Yes Additional Comments: Pt remodeled bathroom for wc accessibility. Uses a bariatric BSC in walk-in shower- has part of it sitting outside over ledge and he slides over to it.  Lives With: Significant other IADL History Homemaking Responsibilities: Yes IADL Comments: Pt enjoys cooking for himself from wc level Prior Function Level of [place]: Independent with basic ADLs Comments: at a w/c level; mod I for transfers with sliding board; independent with bathing/dressing ADL ADL Eating: Set up Grooming: Setup Upper Body Bathing: Setup Lower Body Bathing: Minimal assistance Upper Body Dressing: Setup Lower Body Dressing: Minimal assistance Toileting: Minimal assistance Toilet Transfer: Minimal assistance Tub/Shower Transfer: Not assessed Vision Baseline Vision/History: Wears glasses Wears Glasses: At all times Patient Visual Report: No change from baseline Cognition Overall Cognitive Status: Within Functional Limits for tasks assessed Arousal/Alertness: Awake/alert Orientation Level: Place;Person;Situation Person: Oriented Place: Oriented Situation: Oriented Year: 2020 Month: December Day of Week: Correct Memory: Appears  intact Immediate Memory Recall: Sock;Bed;Blue Memory Recall Sock: With Cue Memory Recall Blue: Without Cue Memory Recall Bed: With Cue Awareness: Appears intact Problem Solving: Appears intact Safety/Judgment: Appears intact Sensation Sensation Light Touch: Impaired Detail Light Touch Impaired Details: (B finger tips with neuropothy- wears gloves a lot) Coordination Fine Motor Movements are Fluid and Coordinated: Yes Finger Nose Finger Test: [facility] Motor  Motor Motor: Within Functional Limits Balance Balance Balance Assessed: Yes Static Sitting Balance Static Sitting - Balance Support: Bilateral upper extremity supported Static Sitting - Level of Assistance: 5: Stand by assistance Dynamic Sitting Balance Dynamic Sitting - Balance Support: During functional activity Dynamic Sitting - Level of Assistance: 5: Stand by assistance Sitting balance - Comments: poor intially, increased to fair with practice Extremity/Trunk Assessment RUE Assessment RUE Assessment: Within Functional Limits LUE Assessment LUE Assessment: Within Functional Limits     Refer to Care Plan for Long Term Goals  Recommendations for other services: None    Discharge Criteria: Patient will be discharged from OT if patient refuses treatment 3 consecutive times without medical reason, if treatment goals not met, if there is a change in medical status, if patient makes no progress towards goals or if patient is discharged from hospital.  The above assessment, treatment plan, treatment alternatives and goals were discussed and mutually agreed upon: by patient  Valma Cava 12/15/2018, 1:14 PM

## 2018-12-14 NOTE — Progress Notes (Signed)
Patient ID: [name], male   DOB: [birth year], 68 y.o.   MRN: [id number] Patient admitted to 4W08 via bed, escorted by significant other and nursing staff.  Patient has been on rehab before and remembers expectations.  Patient and significant other verbalized understanding of masking policy, visitation policy, personal belongings policy, and fall prevention policy.  Patient has incision to right upper leg (AKA site) intact with wound vac (this incision was previously documented in Epic as being on his left leg which is incorrect-changed to reflect actual incision site).  Appears to have approximately 112ml of drainage in canister.  He also has a bleeding lesion to right posterior forearm which he says is skin cancer, covered with tegaderm.  He had a pimple like cystic lesion to right lower buttocks which he reports his significant other squeezed resulting with some white drainage.  This area appears to be healing as there is no drainage, still mildly sore.  Patient appears to be in no immediate distress at this time.  Brita Romp, RN

## 2018-12-14 NOTE — Progress Notes (Signed)
Occupational Therapy Treatment Patient Details Name: [name] MRN: [id number] DOB: Dec 11, 1950 Today's Date: 12/14/2018    History of present illness Pt is a 68 y/o male s/p R transfemoral amputation with wound VAC application. PMH including but not limited to L transfemoral amputation in 2019, DM and PVD.   OT comments  Pt making progress with functional goals. Pt long sitting bed to perform simulated LB ADL tasks min A, AP transefrs min guard A and lateral scoot transfers min A +2 for safety. OT will continue to follow acutely  Follow Up Recommendations  CIR    Equipment Recommendations  None recommended by OT    Recommendations for Other Services      Precautions / Restrictions Precautions Precautions: Fall Precaution Comments: wound VAC Restrictions Weight Bearing Restrictions: Yes RLE Weight Bearing: Non weight bearing LLE Weight Bearing: Non weight bearing       Mobility Bed Mobility Overal bed mobility: Needs Assistance Bed Mobility: Sit to Supine     Supine to sit: Min assist;HOB elevated Sit to supine: Min guard   General bed mobility comments: HOB 30 degrees with bil rails pt able to pull into long sitting with cues for sequence and safety with guarding for initial balance. pt then able to pivot 90 degrees in long sitting  Transfers Overall transfer level: Needs assistance   Transfers: Lateral/Scoot Transfers;Anterior-Posterior Wellsite geologist transfers: Min guard  Lateral/Scoot Transfers: Metallurgist transfer comment: minguard with setup for transfers bed to recliner with A/P transfer pt performed bed<>chair then lateral transfer with drop arm toward right. Pt got "stuck" in the middle between surfaces with increased time and cues to sequence and min +2 for safety with transfer with pt ultimately only clearing right glute to bed and then lying on his side in the bed to complete transfer. PT fatigued with  lateral transfer and unable to tolerate physical assist with transfer due to pain. With lateral transfers would continue sliding board    Balance Overall balance assessment: Needs assistance Sitting-balance support: Bilateral upper extremity supported Sitting balance-Leahy Scale: Poor Sitting balance - Comments: poor intially, increased to fair with practice                                   ADL either performed or assessed with clinical judgement   ADL Overall ADL's : Needs assistance/impaired             Lower Body Bathing: Sitting/lateral leans;Minimal assistance;With caregiver independent assisting       Lower Body Dressing: Minimal assistance;Sitting/lateral leans;With caregiver independent assisting   Toilet Transfer: Min guard;Minimal assistance;+2 for safety/equipment;Cueing for sequencing Toilet Transfer Details (indicate cue type and reason): (simulated)min guard A with AP transefr to recliner, min A + lateral scoot with drop arm chair Toileting- Clothing Manipulation and Hygiene: Min guard;Sitting/lateral lean       Functional mobility during ADLs: Min guard;Minimal assistance;+2 for safety/equipment;Cueing for sequencing General ADL Comments: pt reports performing LB ADL sitting and leaning side to side prior to admission. Pt with Poor - Fair dynamic sitting balance     Vision Baseline Vision/History: Wears glasses Wears Glasses: At all times Patient Visual Report: No change from baseline     Perception     Praxis      Cognition Arousal/Alertness: Awake/alert Behavior During Therapy: WFL for tasks assessed/performed Overall Cognitive Status: Within Functional Limits for tasks  assessed                                          Exercises     Shoulder Instructions       General Comments      Pertinent Vitals/ Pain       Pain Assessment: Faces Faces Pain Scale: Hurts even more Pain Location: R residual limb with  anterior lean Pain Descriptors / Indicators: Operative site guarding Pain Intervention(s): Limited activity within patient's tolerance;Monitored during session;Patient requesting pain meds-RN notified;Repositioned  Home Living   Living Arrangements: Spouse/significant other                       Bathroom Accessibility: Yes How Accessible: Accessible via wheelchair        Lives With: Significant other    Prior Functioning/Environment              Frequency  Min 2X/week        Progress Toward Goals  OT Goals(current goals can now be found in the care plan section)  Progress towards OT goals: Progressing toward goals  Acute Rehab OT Goals Patient Stated Goal: to be able to perform transfers by himself and to go home  Plan Discharge plan remains appropriate    Co-evaluation                 AM-PAC OT "6 Clicks" Daily Activity     Outcome Measure   Help from another person eating meals?: None Help from another person taking care of personal grooming?: None Help from another person toileting, which includes using toliet, bedpan, or urinal?: A Lot Help from another person bathing (including washing, rinsing, drying)?: A Little Help from another person to put on and taking off regular upper body clothing?: A Little Help from another person to put on and taking off regular lower body clothing?: A Lot 6 Click Score: 18    End of Session    OT Visit Diagnosis: Pain;Muscle weakness (generalized) (M62.81) Pain - Right/Left: Right Pain - part of body: Leg   Activity Tolerance Patient tolerated treatment well   Patient Left in bed;with call bell/phone within reach;with family/visitor present   Nurse Communication          Time: [id number] OT Time Calculation (min): 22 min  Charges: OT General Charges $OT Visit: 1 Visit OT Treatments $Therapeutic Activity: 8-22 mins     Britt Bottom 12/14/2018, 1:14 PM

## 2018-12-14 NOTE — Discharge Summary (Signed)
Discharge Diagnoses:  Active Problems:   Atherosclerosis of native arteries of extremities with gangrene, right leg (HCC)   Atherosclerosis of native artery of right leg with gangrene [facility])   Surgeries: Procedure(s): RIGHT ABOVE KNEE AMPUTATION on 12/11/2018    Consultants:   Discharged Condition: Improved  Hospital Course: [name] is an 68 y.o. male who was admitted 12/11/2018 with a chief complaint of Gangrene Right leg, with a final diagnosis of Gangrene Right Leg.  Patient was brought to the operating room on 12/11/2018 and underwent Procedure(s): RIGHT ABOVE KNEE AMPUTATION.    Patient was given perioperative antibiotics:  Anti-infectives (From admission, onward)   Start     Dose/Rate Route Frequency Ordered Stop   12/11/18 1300  ceFAZolin (ANCEF) IVPB 2g/100 mL premix     2 g 200 mL/hr over 30 Minutes Intravenous Every 6 hours 12/11/18 1006 12/12/18 0137   12/11/18 0600  ceFAZolin (ANCEF) 3 g in dextrose 5 % 50 mL IVPB     3 g 100 mL/hr over 30 Minutes Intravenous On call to O.R. 12/10/18 [id number] 12/11/18 0733    .  Patient was given sequential compression devices, early ambulation, and aspirin for DVT prophylaxis.  Recent vital signs:  Patient Vitals for the past 24 hrs:  BP Temp Temp src Pulse Resp SpO2 Height Weight  12/14/18 0244 137/63 98.2 F (36.8 C) Oral 89 - 98 % - -  12/13/18 1915 136/61 98.5 F (36.9 C) Oral 92 17 99 % - -  12/13/18 1433 126/61 98.4 F (36.9 C) Oral 88 - 97 % - -  12/13/18 1324 (!) 146/62 98.7 F (37.1 C) Oral 86 19 - 6' 2.02" (1.88 m) 117.9 kg  12/13/18 0744 (!) 146/62 98.7 F (37.1 C) Oral 86 - 97 % - -  .  Recent laboratory studies: No results found.  Discharge Medications:   Allergies as of 12/14/2018      Reactions   Atorvastatin Other (See Comments)   myalgias   Ezetimibe Other (See Comments)   Body ache   Simvastatin Other (See Comments)   Body ache      Medication List    STOP taking these medications   HOMEOPATHIC  PRODUCTS NA   mupirocin ointment 2 % Commonly known as: BACTROBAN   nitroGLYCERIN 0.4 mg/hr patch Commonly known as: NITRODUR - Dosed in mg/24 hr   pentoxifylline 400 MG CR tablet Commonly known as: TRENTAL   rosuvastatin 5 MG tablet Commonly known as: CRESTOR   Turmeric 500 MG Caps     TAKE these medications   Accu-Chek Guide test strip Generic drug: glucose blood USE AS INSTRUCTED TO CHECK SUGAR 3 TIMES DAILY DX- E11.42   acetaminophen 325 MG tablet Commonly known as: TYLENOL Take 1-2 tablets (325-650 mg total) by mouth every 6 (six) hours as needed for mild pain (pain score 1-3 or temp > 100.5).   aspirin 325 MG tablet Take 325 mg by mouth daily.   carvedilol 25 MG tablet Commonly known as: COREG TAKE 1 TABLET TWICE DAILY   docusate sodium 100 MG capsule Commonly known as: COLACE Take 1 capsule (100 mg total) by mouth 2 (two) times daily.   Droplet Pen Needles 31G X 5 MM Misc Generic drug: Insulin Pen Needle USE TO INJECT INSULIN FOUR TIMES DAILY AS INSTRUCTED (FOR DIABETES)   losartan-hydrochlorothiazide 50-12.5 MG tablet Commonly known as: HYZAAR TAKE 1 TABLET EVERY DAY   metFORMIN 1000 MG tablet Commonly known as: GLUCOPHAGE TAKE 1 TABLET EVERY  DAY What changed: when to take this   multivitamin with minerals Tabs tablet Take 1 tablet by mouth daily. Centrum Silver for Men 50+   naproxen sodium 220 MG tablet Commonly known as: ALEVE Take 440-660 mg by mouth 2 (two) times daily as needed (pain.).   NovoLOG FlexPen 100 UNIT/ML FlexPen Generic drug: insulin aspart INJECT 16-26 UNITS SUBCUTANEOUSLY THREE TIMES DAILY WITH MEALS What changed:   how much to take  how to take this  when to take this  additional instructions   oxyCODONE 5 MG immediate release tablet Commonly known as: Oxy IR/ROXICODONE Take 1-2 tablets (5-10 mg total) by mouth every 4 (four) hours as needed for moderate pain (pain score 4-6).   potassium citrate 10 MEQ (1080 MG)  SR tablet Commonly known as: UROCIT-K Take 20 mEq by mouth 2 (two) times daily.   PROBIOTIC PO Take 1 capsule by mouth daily. Probulin Probiotic Supplement   Toujeo Max SoloStar 300 UNIT/ML Sopn Generic drug: Insulin Glargine (2 Unit Dial) Inject 55 Units into the skin at bedtime. What changed: how much to take   vitamin C 1000 MG tablet Take 1,000 mg by mouth daily.   Vitamin D3 125 MCG (5000 UT) Tabs Take 5,000 Units by mouth daily.   zinc gluconate 50 MG tablet Take 50 mg by mouth 2 (two) times daily.       Diagnostic Studies: No results found.  Patient benefited maximally from their hospital stay and there were no complications.     Disposition: Discharge disposition: 02-Transferred to [facility]      Discharge Instructions    Call MD / Call 911   Complete by: As directed    If you experience chest pain or shortness of breath, CALL 911 and be transported to the hospital emergency room.  If you develope a fever above 101 F, pus (white drainage) or increased drainage or redness at the wound, or calf pain, call your surgeon's office.   Constipation Prevention   Complete by: As directed    Drink plenty of fluids.  Prune juice may be helpful.  You may use a stool softener, such as Colace (over the counter) 100 mg twice a day.  Use MiraLax (over the counter) for constipation as needed.   Diet - low sodium heart healthy   Complete by: As directed    Increase activity slowly as tolerated   Complete by: As directed    Neg Press Wound Therapy / Incisional   Complete by: As directed    Please instruct patient how to attach [place] / Incisional   Complete by: As directed    Show patient how to attach prevena VAC     Follow-up Information    [name], MD Follow up in 1 week(s).   Specialty: Orthopedic Surgery Contact information: [address] [place] [address] [phone]            Signed: Bevely Palmer  Amisadai Woodford 12/14/2018, 7:42 AM

## 2018-12-14 NOTE — Progress Notes (Signed)
Pt currently wearing CPAP with nasal mask. RT will assist with CPAP needs if pt requires. RT will continue to monitor.

## 2018-12-15 ENCOUNTER — Inpatient Hospital Stay (HOSPITAL_COMMUNITY): Payer: Medicare Other | Admitting: Occupational Therapy

## 2018-12-15 ENCOUNTER — Inpatient Hospital Stay (HOSPITAL_COMMUNITY): Payer: Medicare Other | Admitting: Physical Therapy

## 2018-12-15 ENCOUNTER — Encounter: Payer: Self-pay | Admitting: Orthopedic Surgery

## 2018-12-15 DIAGNOSIS — S78111A Complete traumatic amputation at level between right hip and knee, initial encounter: Secondary | ICD-10-CM

## 2018-12-15 LAB — GLUCOSE, CAPILLARY
Glucose-Capillary: 189 mg/dL — ABNORMAL HIGH (ref 70–99)
Glucose-Capillary: 193 mg/dL — ABNORMAL HIGH (ref 70–99)
Glucose-Capillary: 211 mg/dL — ABNORMAL HIGH (ref 70–99)
Glucose-Capillary: 249 mg/dL — ABNORMAL HIGH (ref 70–99)

## 2018-12-15 LAB — COMPREHENSIVE METABOLIC PANEL
ALT: 12 U/L (ref 0–44)
AST: 17 U/L (ref 15–41)
Albumin: 2.9 g/dL — ABNORMAL LOW (ref 3.5–5.0)
Alkaline Phosphatase: 48 U/L (ref 38–126)
Anion gap: 13 (ref 5–15)
BUN: 22 mg/dL (ref 8–23)
CO2: 25 mmol/L (ref 22–32)
Calcium: 9 mg/dL (ref 8.9–10.3)
Chloride: 101 mmol/L (ref 98–111)
Creatinine, Ser: 0.87 mg/dL (ref 0.61–1.24)
GFR calc Af Amer: >60 mL/min (ref >60)
GFR calc non Af Amer: >60 mL/min (ref >60)
Glucose, Bld: 192 mg/dL — ABNORMAL HIGH (ref 70–99)
Potassium: 4.3 mmol/L (ref 3.5–5.1)
Sodium: 139 mmol/L (ref 135–145)
Total Bilirubin: 1 mg/dL (ref 0.3–1.2)
Total Protein: 6.1 g/dL — ABNORMAL LOW (ref 6.5–8.1)

## 2018-12-15 LAB — CBC WITH DIFFERENTIAL/PLATELET
Abs Immature Granulocytes: 0.29 10*3/uL — ABNORMAL HIGH (ref 0.00–0.07)
Basophils Absolute: 0.1 10*3/uL (ref 0.0–0.1)
Basophils Relative: 1 %
Eosinophils Absolute: 0.3 10*3/uL (ref 0.0–0.5)
Eosinophils Relative: 3 %
HCT: 33.9 % — ABNORMAL LOW (ref 39.0–52.0)
Hemoglobin: 11.2 g/dL — ABNORMAL LOW (ref 13.0–17.0)
Immature Granulocytes: 3 %
Lymphocytes Relative: 12 %
Lymphs Abs: 1.2 10*3/uL (ref 0.7–4.0)
MCH: 27.5 pg (ref 26.0–34.0)
MCHC: 33 g/dL (ref 30.0–36.0)
MCV: 83.3 fL (ref 80.0–100.0)
Monocytes Absolute: 0.7 10*3/uL (ref 0.1–1.0)
Monocytes Relative: 7 %
Neutro Abs: 7.5 10*3/uL (ref 1.7–7.7)
Neutrophils Relative %: 74 %
Platelets: 282 10*3/uL (ref 150–400)
RBC: 4.07 MIL/uL — ABNORMAL LOW (ref 4.22–5.81)
RDW: 16.4 % — ABNORMAL HIGH (ref 11.5–15.5)
WBC: 10.1 10*3/uL (ref 4.0–10.5)
nRBC: 0.2 % (ref 0.0–0.2)

## 2018-12-15 NOTE — Progress Notes (Signed)
Office Visit Note   Patient: [name]           Date of Birth: 12-Apr-1950           MRN: [id number] Visit Date: 11/30/2018              Requested by: [name], MD [place],  [place] [id number] PCP: [name], MD  Chief Complaint  Patient presents with  . Right Leg - Follow-up      HPI: Patient is a 68 year old gentleman who presents in follow-up for venous and arterial insufficiency right lower extremity he is currently undergoing compression wraps and Dynaflex wrap.  Assessment & Plan: Visit Diagnoses:  1. Idiopathic chronic venous hypertension of right lower extremity with ulcer and inflammation (HCC)     Plan: Patient's ulcers seem to be primarily from decreased microcirculation with compression wraps patient is not showing much improvement.  We will continue with twice a week Dynaflex wraps patient has strong biphasic pulses so arterial intervention is not necessary.  Will continue with current care  Follow-Up Instructions: Return in about 2 weeks (around 12/14/2018).   Ortho Exam  Patient is alert, oriented, no adenopathy, well-dressed, normal affect, normal respiratory effort. Examination patient has good petechial bleeding in the wounds he is on [place] however patient is developing progressive ischemic ulcers in the calf from decreased microcirculation.  He has a black ulcer right great toe which is healed from using the nitroglycerin patch.  Doppler was used he has a strong dorsalis pedis and posterior tibial pulse which is biphasic there is no cellulitis.  Imaging: No results found. No images are attached to the encounter.  Labs: Lab Results  Component Value Date   HGBA1C 8.6 (H) 12/11/2018   HGBA1C 6.4 (A) 12/12/2017   HGBA1C 7.3 (A) 08/05/2017   ESRSEDRATE 35 (H) 03/31/2013   CRP 5.3 (H) 03/31/2013   REPTSTATUS 12/25/2017 FINAL 12/20/2017   GRAMSTAIN  12/30/2012    FEW WBC PRESENT, PREDOMINANTLY PMN RARE SQUAMOUS  EPITHELIAL CELLS PRESENT ABUNDANT GRAM NEGATIVE RODS MODERATE GRAM POSITIVE COCCI IN PAIRS IN CLUSTERS   CULT  12/20/2017    NO GROWTH 5 DAYS Performed at [facility], [place] [address], [place], [place] [address]    LABORGA KLEBSIELLA OXYTOCA 12/15/2012   LABORGA PSEUDOMONAS AERUGINOSA 12/15/2012     Lab Results  Component Value Date   ALBUMIN 2.9 (L) 12/15/2018   ALBUMIN 2.6 (L) 12/27/2017   ALBUMIN 2.7 (L) 12/26/2017    No results found for: MG No results found for: VD25OH  No results found for: PREALBUMIN CBC EXTENDED Latest Ref Rng & Units 12/15/2018 12/13/2018 12/12/2018  WBC 4.0 - 10.5 K/uL 10.1 10.5 15.7(H)  RBC 4.22 - 5.81 MIL/uL 4.07(L) 4.09(L) 4.22  HGB 13.0 - 17.0 g/dL 11.2(L) 11.1(L) 11.5(L)  HCT 39.0 - 52.0 % 33.9(L) 34.0(L) 35.5(L)  PLT 150 - 400 K/uL 282 257 295  NEUTROABS 1.7 - 7.7 K/uL 7.5 - -  LYMPHSABS 0.7 - 4.0 K/uL 1.2 - -     Body mass index is 33.38 kg/m.  Orders:  No orders of the defined types were placed in this encounter.  No orders of the defined types were placed in this encounter.    Procedures: No procedures performed  Clinical Data: No additional findings.  ROS:  All other systems negative, except as noted in the HPI. Review of Systems  Objective: Vital Signs: Ht 6\' 2"  (1.88 m)  Wt 260 lb (117.9 kg)   BMI 33.38 kg/m   Specialty Comments:  No specialty comments available.  PMFS History: Patient Active Problem List   Diagnosis Date Noted  . Above knee amputation of right lower extremity ([place]) 12/14/2018  . Atherosclerosis of native artery of right leg with gangrene ([place]) 12/11/2018  . Hypoalbuminemia due to protein-calorie malnutrition ([place])   . Type 2 diabetes mellitus with peripheral neuropathy (HCC)   . Left above-knee amputee ([place]) 12/26/2017  . Unilateral AKA, left ([place])   . History of complete ray amputation of fifth toe of right foot ([place])   . Postoperative pain   . Essential hypertension   .  Atherosclerosis of native arteries of extremities with gangrene, right leg ([place]) 02/19/2017  . Chronic renal disease, stage III 02/19/2017  . Ulcer of toe of right foot, limited to breakdown of skin ([place]) 10/11/2016  . Type 2 diabetes mellitus with diabetic polyneuropathy, with long-term current use of insulin ([place]) 03/29/2015  . Advance directive discussed with patient 02/07/2014  . Normocytic anemia 04/01/2013  . Diabetic Charcot foot ([place]) 04/01/2013  . Routine general medical examination at a health care facility 12/25/2010  . PERSONAL HX COLONIC POLYPS 11/22/2009  . Venous (peripheral) insufficiency 06/29/2008  . Osteoarthritis, multiple sites 04/03/2007  . ERECTILE DYSFUNCTION, ORGANIC 06/13/2006  . Hyperlipemia 06/11/2006  . GLAUCOMA 06/11/2006  . Coronary atherosclerosis of native coronary artery 06/11/2006  . Obstructive sleep apnea 06/11/2006   Past Medical History:  Diagnosis Date  . Cellulitis and abscess of left lower extremity 12/20/2017  . Coronary atherosclerosis of unspecified type of vessel, native or graft   . Diabetes mellitus without complication ([place])    type 2  . Hemorrhage of rectum and anus   . History of kidney stones    passed stones  . Impotence of organic origin   . Internal hemorrhoids without mention of complication   . Left below-knee amputee ([place]) 02/23/2016  . Malignant neoplasm of prostate ([place])   . Mononeuritis of unspecified site   . Osteoarthrosis, unspecified whether generalized or localized, unspecified site   . Other and unspecified hyperlipidemia   . Peripheral vascular disease ([place])   . Personal history of colonic polyps   . Personal history of diabetic foot ulcer    saw wound center, resolved 05/08/2010  . Personal history of gallstones   . Routine general medical examination at a health care facility   . Subacute osteomyelitis, right ankle and foot ([place])   . Type II or unspecified type diabetes mellitus with neurological  manifestations, not stated as uncontrolled(250.60)   . Unspecified glaucoma(365.9)   . Unspecified sleep apnea    cpcp  . Unspecified venous (peripheral) insufficiency     Family History  Problem Relation Age of Onset  . Lung cancer Father   . Multiple sclerosis Mother   . Heart attack Other        paternal aunts and uncles  . Peripheral vascular disease Maternal Grandfather        several amputations    Past Surgical History:  Procedure Laterality Date  . AMPUTATION Left 12/30/2012   Procedure: AMPUTATION RAY ;  Surgeon: Meredith Pel, MD;  Location: [facility];  Service: Orthopedics;  Laterality: Left;  LEFT GREAT TOE RAY AMPUTATION  . AMPUTATION Left 02/23/2016   Procedure: Left Below Knee Amputation;  Surgeon: [name], MD;  Location: [facility];  Service: Orthopedics;  Laterality: Left;  . AMPUTATION Left 12/24/2017   Procedure:  LEFT ABOVE KNEE AMPUTATION;  Surgeon: [name], MD;  Location: [facility];  Service: Orthopedics;  Laterality: Left;  . AMPUTATION Right 12/24/2017   Procedure: RIGHT 5TH RAY AMPUTATION;  Surgeon: [name], MD;  Location: [facility];  Service: Orthopedics;  Laterality: Right;  . AMPUTATION Right 12/11/2018   Procedure: RIGHT ABOVE KNEE AMPUTATION;  Surgeon: [name], MD;  Location: [facility];  Service: Orthopedics;  Laterality: Right;  . APPENDECTOMY    . BASAL CELL CARCINOMA EXCISION  2/16   left forearm  . BELPHAROPTOSIS REPAIR    . CARDIAC CATHETERIZATION  1998   Negative  . CATARACT EXTRACTION Right 2017   then lid surgery  . COLONOSCOPY    . CORONARY ARTERY BYPASS GRAFT  09/2005   5 bypasses per patient, Post op AFIB  . EYE SURGERY    . FOOT BONE EXCISION Left 11/03/2013   DR Lisa Blakeman   . I&D EXTREMITY Left 11/03/2013   Procedure: Left Foot Partial Bone Excision Cuboid and Medial Cuneiform, Wound Closures;  Surgeon: [name], MD;  Location: [facility];  Service: Orthopedics;  Laterality: Left;  . INSERTION PROSTATE RADIATION SEED  2009   RT  and seeds for prostate cancer  . KIDNEY STONE SURGERY  04/1993  . RCA stents  04/2003   EF 55%  . RETINAL DETACHMENT SURGERY  2002-2003  . thrombosed vein  1993   Right leg   Social History   Occupational History  . Occupation: Heavy equipment mechanic--retired  Tobacco Use  . Smoking status: Former Smoker    Packs/day: 2.00    Years: 35.00    Pack years: 70.00    Types: Cigars, Cigarettes    Quit date: 01/07/2001    Years since quitting: 17.9  . Smokeless tobacco: Never Used  Substance and Sexual Activity  . Alcohol use: Not Currently    Comment: rare  . Drug use: No  . Sexual activity: Yes    Partners: Female

## 2018-12-15 NOTE — Evaluation (Signed)
Physical Therapy Assessment and Plan  Patient Details  Name: [name] MRN: [id number] Date of Birth: 01/07/1951  PT Diagnosis: Difficulty walking and Muscle weakness Rehab Potential: Good ELOS: 5-7 days   Today's Date: 12/15/2018 PT Individual Time: 1100-1205 PT Individual Time Calculation (min): 65 min    Problem List:  Patient Active Problem List   Diagnosis Date Noted  . Above knee amputation of right lower extremity ([place]) 12/14/2018  . Atherosclerosis of native artery of right leg with gangrene ([place]) 12/11/2018  . Hypoalbuminemia due to protein-calorie malnutrition ([place])   . Type 2 diabetes mellitus with peripheral neuropathy (HCC)   . Left above-knee amputee ([place]) 12/26/2017  . Unilateral AKA, left ([place])   . History of complete ray amputation of fifth toe of right foot ([place])   . Postoperative pain   . Essential hypertension   . Atherosclerosis of native arteries of extremities with gangrene, right leg ([place]) 02/19/2017  . Chronic renal disease, stage III 02/19/2017  . Ulcer of toe of right foot, limited to breakdown of skin ([place]) 10/11/2016  . Type 2 diabetes mellitus with diabetic polyneuropathy, with long-term current use of insulin ([place]) 03/29/2015  . Advance directive discussed with patient 02/07/2014  . Normocytic anemia 04/01/2013  . Diabetic Charcot foot ([place]) 04/01/2013  . Routine general medical examination at a health care facility 12/25/2010  . PERSONAL HX COLONIC POLYPS 11/22/2009  . Venous (peripheral) insufficiency 06/29/2008  . Osteoarthritis, multiple sites 04/03/2007  . ERECTILE DYSFUNCTION, ORGANIC 06/13/2006  . Hyperlipemia 06/11/2006  . GLAUCOMA 06/11/2006  . Coronary atherosclerosis of native coronary artery 06/11/2006  . Obstructive sleep apnea 06/11/2006    Past Medical History:  Past Medical History:  Diagnosis Date  . Cellulitis and abscess of left lower extremity 12/20/2017  . Coronary atherosclerosis of unspecified type of vessel,  native or graft   . Diabetes mellitus without complication ([place])    type 2  . Hemorrhage of rectum and anus   . History of kidney stones    passed stones  . Impotence of organic origin   . Internal hemorrhoids without mention of complication   . Left below-knee amputee ([place]) 02/23/2016  . Malignant neoplasm of prostate ([place])   . Mononeuritis of unspecified site   . Osteoarthrosis, unspecified whether generalized or localized, unspecified site   . Other and unspecified hyperlipidemia   . Peripheral vascular disease ([place])   . Personal history of colonic polyps   . Personal history of diabetic foot ulcer    saw wound center, resolved 05/08/2010  . Personal history of gallstones   . Routine general medical examination at a health care facility   . Subacute osteomyelitis, right ankle and foot ([place])   . Type II or unspecified type diabetes mellitus with neurological manifestations, not stated as uncontrolled(250.60)   . Unspecified glaucoma(365.9)   . Unspecified sleep apnea    cpcp  . Unspecified venous (peripheral) insufficiency    Past Surgical History:  Past Surgical History:  Procedure Laterality Date  . AMPUTATION Left 12/30/2012   Procedure: AMPUTATION RAY ;  Surgeon: Meredith Pel, MD;  [facility];  Service: Orthopedics;  Laterality: Left;  LEFT GREAT TOE RAY AMPUTATION  . AMPUTATION Left 02/23/2016   Procedure: Left Below Knee Amputation;  Surgeon: [name], MD;  Location: [facility];  Service: Orthopedics;  Laterality: Left;  . AMPUTATION Left 12/24/2017   Procedure: LEFT ABOVE KNEE AMPUTATION;  Surgeon: [name], MD;  Location: [facility];  Service: Orthopedics;  Laterality: Left;  . AMPUTATION Right 12/24/2017   Procedure: RIGHT 5TH RAY AMPUTATION;  Surgeon: [name], MD;  Location: [facility];  Service: Orthopedics;  Laterality: Right;  . AMPUTATION Right 12/11/2018   Procedure: RIGHT ABOVE KNEE AMPUTATION;  Surgeon: [name], MD;  Location: [facility];  Service:  Orthopedics;  Laterality: Right;  . APPENDECTOMY    . BASAL CELL CARCINOMA EXCISION  2/16   left forearm  . BELPHAROPTOSIS REPAIR    . CARDIAC CATHETERIZATION  1998   Negative  . CATARACT EXTRACTION Right 2017   then lid surgery  . COLONOSCOPY    . CORONARY ARTERY BYPASS GRAFT  09/2005   5 bypasses per patient, Post op AFIB  . EYE SURGERY    . FOOT BONE EXCISION Left 11/03/2013   DR DUDA   . I&D EXTREMITY Left 11/03/2013   Procedure: Left Foot Partial Bone Excision Cuboid and Medial Cuneiform, Wound Closures;  Surgeon: [name], MD;  Location: [facility];  Service: Orthopedics;  Laterality: Left;  . INSERTION PROSTATE RADIATION SEED  2009   RT and seeds for prostate cancer  . KIDNEY STONE SURGERY  04/1993  . RCA stents  04/2003   EF 55%  . RETINAL DETACHMENT SURGERY  2002-2003  . thrombosed vein  1993   Right leg    Assessment & Plan Clinical Impression: Patient is a 68 y.o. year old male with history of CAD, T2DM--with diabetic polyneuropathy, PVD s/p L-BKA 12/2017 (did not get prosthesis yet due to Covid restrictions) and right shin ulcerswhich was being managed on outpatient basis with close follow up. He continued to have progressive gangrenous changes with foul smelling drainage and was admitted on 12/11/18 for R-BKA by Dr. [name]. Post op with ABLA and reactive leucocytosis resolving. Therapy initiated and patient limited by pain with balance deficits and weakness. CIR recommended due to functional deficits. .  Patient transferred to CIR on 12/14/2018 .   Patient currently requires supervision with mobility secondary to muscle weakness, decreased cardiorespiratoy endurance, and decreased sitting balance and decreased balance strategies.  Prior to hospitalization, patient was modified independent  with mobility and lived with Significant other in a House home.  Home access is  Ramped entrance.  Patient will benefit from skilled PT intervention to maximize safe functional mobility,  minimize fall risk and decrease caregiver burden for planned discharge home with intermittent assist.  Anticipate patient will benefit from follow up [facility] at discharge.  PT - End of Session Activity Tolerance: Tolerates 30+ min activity with multiple rests Endurance Deficit: Yes Endurance Deficit Description: Intermittent rest breaks PT Assessment Rehab Potential (ACUTE/IP ONLY): Good PT Patient demonstrates impairments in the following area(s): Balance;Sensory;Endurance;Pain;Motor PT Transfers Functional Problem(s): Bed Mobility;Bed to Chair;Car;Furniture PT Locomotion Functional Problem(s): Stairs;Wheelchair Mobility PT Plan PT Intensity: Minimum of 1-2 x/day ,45 to 90 minutes PT Frequency: 5 out of 7 days PT Duration Estimated Length of Stay: 5-7 days PT Treatment/Interventions: Financial risk analyst;Neuromuscular re-education;Psychosocial support;Stair training;UE/LE Strength taining/ROM;Wheelchair propulsion/positioning;UE/LE Coordination activities;Therapeutic Activities;Skin care/wound management;Pain management;Functional electrical stimulation;Discharge planning;Balance/vestibular training;Disease management/prevention;Functional mobility training;Patient/family education;Splinting/orthotics;Visual/perceptual remediation/compensation;Therapeutic Exercise PT Transfers Anticipated Outcome(s): set up<>mod I with LRAD PT Locomotion Anticipated Outcome(s): mod I w/c level PT Recommendation Follow Up Recommendations: Home health PT Patient destination: Home Equipment Recommended: To be determined  Skilled Therapeutic Intervention Patient received in bed & agreeable to tx. Pt only reports pain when leaning to R on RLE with decreased pain with repositioning. Provided pt with 22x18 w/c &  roho cushion to prevent skin breakdown & promote OOB tolerance. Also applied weights to front of w/c as caster wheels frequently lifting off ground when pt propels with  added weights correcting this. Pt with modified home set up with pt reporting a ramped entrance & a platform he uses to make w/c height equal with that of his car seat height. Pt completes bed mobility with supervision & hospital bed features (pt reports he has 1 rail at home), A/P transfers with use of slide board w/c<>bed and w/c<>car at equal heights with therapist assisting with board placement. Pt propels w/c with BUE & supervision. At end of session pt left in bed with 4 rails up (pt approved), bed alarm set & call bell in reach.  Discussed potential of getting pt a lightweight w/c with pt reporting he is fine with what he has. Also discussed potential of getting pt hospital bed with pt stating he will discuss this with his wife. Planning for pt's wife to come in tomorrow to practice real car transfer at 1 pm.  Educated pt on daily therapy schedule, weekly team meetings, & ELOS.   PT Evaluation Precautions/Restrictions Precautions Precautions: Fall Precaution Comments: wound VAC Restrictions Weight Bearing Restrictions: Yes RLE Weight Bearing: Non weight bearing General Chart Reviewed: Yes Additional Pertinent History: CAD, DM2, diabetic polyneuropathy, PVD, glaucoma Response to Previous Treatment: Patient with no complaints from previous session. Family/Caregiver Present: No   Home Living/Prior Functioning Home Living Available Help at Discharge: Family;Available 24 hours/day Type of Home: House Home Access: Ramped entrance Home Layout: One level  Lives With: Significant other Prior Function Level of Independence: Independent with basic ADLs Comments: mod I at w/c level with use of slide board  Vision/Perception  Pt wears glasses at all times at baseline. No changes in baseline.  Cognition Overall Cognitive Status: Within Functional Limits for tasks assessed Arousal/Alertness: Awake/alert Orientation Level: Oriented X4 Memory: Appears intact Awareness: Appears  intact Problem Solving: Appears intact Safety/Judgment: Appears intact   Sensation Sensation Light Touch: Impaired Detail Light Touch Impaired Details: (wears BUE gloves 2/2 neuropathy in fingers) Coordination Fine Motor Movements are Fluid and Coordinated: Yes  Motor  Motor Motor: Within Functional Limits   Mobility Bed Mobility Bed Mobility: Rolling Right;Supine to Sit;Sit to Supine(bed rails) Rolling Right: Supervision/verbal cueing Supine to Sit: Supervision/Verbal cueing Sit to Supine: Supervision/Verbal cueing Transfers Transfers: Government social research officer Transfer: Supervision/Verbal cueing(with use of slide board)   Locomotion  Gait Ambulation: No Gait Gait: No Stairs / Additional Locomotion Stairs: No Architect: Yes Wheelchair Assistance: Chartered loss adjuster: Both upper extremities Wheelchair Parts Management: Supervision/cueing Distance: 100 ft   Trunk/Postural Assessment  Cervical Assessment Cervical Assessment: Within Functional Limits Thoracic Assessment Thoracic Assessment: Within Functional Limits Lumbar Assessment Lumbar Assessment: (posterior pelvic tilt) Postural Control Postural Control: Deficits on evaluation Righting Reactions: slightly delayed   Balance Balance Balance Assessed: Yes Static Sitting Balance Static Sitting - Balance Support: Bilateral upper extremity supported Static Sitting - Level of Assistance: 5: Stand by assistance  Dynamic Sitting Balance Dynamic Sitting - Balance Support: During functional activity Dynamic Sitting - Level of Assistance: 5: Stand by assistance(during A/P transfer)   Extremity Assessment  RUE Assessment RUE Assessment: Within Functional Limits LUE Assessment LUE Assessment: Within Functional Limits RLE Assessment Active Range of Motion (AROM) Comments: WFL LLE Assessment Active Range of Motion (AROM) Comments:  WFL    Refer to Care Plan for Long Term Goals  Recommendations for other services: None  Discharge Criteria: Patient will be discharged from PT if patient refuses treatment 3 consecutive times without medical reason, if treatment goals not met, if there is a change in medical status, if patient makes no progress towards goals or if patient is discharged from hospital.  The above assessment, treatment plan, treatment alternatives and goals were discussed and mutually agreed upon: by patient  Victoria M Miller 12/15/2018, 1:33 PM  

## 2018-12-15 NOTE — Progress Notes (Signed)
[facility] PHYSICAL MEDICINE & REHABILITATION PROGRESS NOTE   Subjective/Complaints: Had a good night. Finally moving bowels. Pain under reasonable control  ROS: Patient denies fever, rash, sore throat, blurred vision, nausea, vomiting, diarrhea, cough, shortness of breath or chest pain, , headache, or mood change.    Objective:   No results found. Recent Labs    12/13/18 0628 12/15/18 0540  WBC 10.5 10.1  HGB 11.1* 11.2*  HCT 34.0* 33.9*  PLT 257 282   Recent Labs    12/15/18 0540  NA 139  K 4.3  CL 101  CO2 25  GLUCOSE 192*  BUN 22  CREATININE 0.87  CALCIUM 9.0    Intake/Output Summary (Last 24 hours) at 12/15/2018 0929 Last data filed at 12/15/2018 0900 Gross per 24 hour  Intake 120 ml  Output 700 ml  Net -580 ml     Physical Exam: Vital Signs Blood pressure (!) 149/69, pulse 84, temperature 98.4 F (36.9 C), temperature source Oral, resp. rate 20, weight 119.6 kg, SpO2 100 %. Constitutional: No distress . Vital signs reviewed. HEENT: EOMI, oral membranes moist Neck: supple Cardiovascular: RRR without murmur. No JVD    Respiratory: CTA Bilaterally without wheezes or rales. Normal effort    GI: BS +, non-tender, non-distended  Musculoskeletal:  Comments: RLE remains in limb guard, vac. Left AKA well formed. Neurological: He is alertand oriented to person, place, and time. Nocranial nerve deficit. UE 5/5 prox to distal. Able to lift right leg slightly off bed. LLE 4/5. No sensory deficits in residual LE's. Skin:No rashnoted. He is not diaphoretic. Noerythema. RLE with vac in place Psychiatric: pleasant and appropriate   Assessment/Plan: 1. Functional deficits secondary to right AKA which require 3+ hours per day of interdisciplinary therapy in a comprehensive inpatient rehab setting.  Physiatrist is providing close team supervision and 24 hour management of active medical problems listed below.  Physiatrist and rehab team continue to  assess barriers to discharge/monitor patient progress toward functional and medical goals  Care Tool:  Bathing              Bathing assist       Upper Body Dressing/Undressing Upper body dressing        Upper body assist      Lower Body Dressing/Undressing Lower body dressing            Lower body assist       Toileting Toileting    Toileting assist Assist for toileting: Moderate Assistance - Patient 50 - 74% Assistive Device Comment: (bsc)   Transfers Chair/bed transfer  Transfers assist     Chair/bed transfer assist level: Moderate Assistance - Patient 50 - 74%     Locomotion Ambulation   Ambulation assist              Walk 10 feet activity   Assist           Walk 50 feet activity   Assist           Walk 150 feet activity   Assist           Walk 10 feet on uneven surface  activity   Assist           Wheelchair     Assist               Wheelchair 50 feet with 2 turns activity    Assist            Wheelchair 150 feet  activity     Assist          Blood pressure (!) 149/69, pulse 84, temperature 98.4 F (36.9 C), temperature source Oral, resp. rate 20, weight 119.6 kg, SpO2 100 %.  Medical Problem List and Plan: 1.Functional and mobility deficitssecondary to right AKA (hx of left AKA 12/19 ) -patient maynot yetshower -ELOS/Goals: 7-10 days, mod I goals at w/c level -will conference pt today 2. Antithrombotics: -DVT/anticoagulation:Pharmaceutical:Lovenox -antiplatelet therapy: ASA 3. Pain Management:oxycodone or tylenol PRN. 4. Mood:LCSW to follow for evaluation and support. -antipsychotic agents: N/A 5. Neuropsych: This patientiscapable of making decisions on hisown behalf. 6. Skin/Wound Care:Wound VAC for 7 daysthen apply appropriate dressing based on wound appearance. 7.  Fluids/Electrolytes/Nutrition:Monitor I/O--discontinue IVF.    -I personally reviewed the patient's labs today.  WNL 8. T2DM: Monitor BS ac/hs. On metformin 1000 mg bid withTujeo40 units at bedtime. -adjust regimen as needed 9.HTN: Monitor BP tid--continue Cozaar/HCTZ Daily.  -reasonable control 12/8  10. [id number] to use CPAP  11. H/o CAD: On ASA and coreg. 12. Mild constipation: results with miralax and senna-s  -use senna-s as a regular part of regimen    LOS: 1 days A FACE TO FACE EVALUATION WAS PERFORMED  Meredith Staggers 12/15/2018, 9:29 AM

## 2018-12-15 NOTE — Patient Care Conference (Signed)
Inpatient RehabilitationTeam Conference and Plan of Care Update Date: 12/15/2018   Time: 10:45 AM    Patient Name: [name]      Medical Record Number: [id number]  Date of Birth: [birth year] Sex: Male         Room/Bed: 4W08C/4W08C-01 Payor Info: Payor: MEDICARE / Plan: MEDICARE PART A AND B / Product Type: *No Product type* /    Admit Date/Time:  12/14/2018  3:11 PM  Primary Diagnosis:  Above knee amputation of right lower extremity [facility])  Patient Active Problem List   Diagnosis Date Noted  . Above knee amputation of right lower extremity ([place]) 12/14/2018  . Atherosclerosis of native artery of right leg with gangrene ([place]) 12/11/2018  . Hypoalbuminemia due to protein-calorie malnutrition ([place])   . Type 2 diabetes mellitus with peripheral neuropathy ([facility])   . Left above-knee amputee ([place]) 12/26/2017  . Unilateral AKA, left ([place])   . History of complete ray amputation of fifth toe of right foot ([place])   . Postoperative pain   . Essential hypertension   . Atherosclerosis of native arteries of extremities with gangrene, right leg ([place]) 02/19/2017  . Chronic renal disease, stage III 02/19/2017  . Ulcer of toe of right foot, limited to breakdown of skin ([place]) 10/11/2016  . Type 2 diabetes mellitus with diabetic polyneuropathy, with long-term current use of insulin ([place]) 03/29/2015  . Advance directive discussed with patient 02/07/2014  . Normocytic anemia 04/01/2013  . Diabetic Charcot foot ([place]) 04/01/2013  . Routine general medical examination at a health care facility 12/25/2010  . PERSONAL HX COLONIC POLYPS 11/22/2009  . Venous (peripheral) insufficiency 06/29/2008  . Osteoarthritis, multiple sites 04/03/2007  . ERECTILE DYSFUNCTION, ORGANIC 06/13/2006  . Hyperlipemia 06/11/2006  . GLAUCOMA 06/11/2006  . Coronary atherosclerosis of native coronary artery 06/11/2006  . Obstructive sleep apnea 06/11/2006    Expected Discharge Date: Expected Discharge Date: 12/19/18  Team  Members Present: Physician leading conference: Dr. Alger Simons Social Worker Present: Lennart Pall, LCSW Nurse Present: Other (comment)(Olayinka Jamison Oka, LPN) Case Manager: Karene Fry, RN PT Present: Lavone Nian, PT OT Present: Cherylynn Ridges, OT SLP Present: Weston Anna, SLP PPS Coordinator present : Gunnar Fusi, SLP     Current Status/Progress Goal Weekly Team Focus  Bowel/Bladder   Pt is continent of B/B, LBM 12/14/18  Pt will remain continent of B/B with normal bowel pattern  Q2h/PRN toileting   Swallow/Nutrition/ Hydration             ADL's   Min/Mod A overall  Supervision  self-care retraining, activity tolerance, transfers, dc planning   Mobility   supervision A/P transfers with use of slide board, supervision bed mobility with hospital bed features, supervision w/c mobility  set up<>mod I overall  real car transfer, d/c planning, pt education   Communication             Safety/Cognition/ Behavioral Observations            Pain   pt c/o increased pain with movement and anything involving dressing change  Pt will remain pain free  Assess pain Qshift/ PRN   Skin   Pt has small red/pink area on bottom R Cheek, wound vac on the RLE  Pt will be free of infection and further breakdown  Assess skin Qshift/PRN    Rehab Goals Patient on target to meet rehab goals: Yes *See Care Plan and progress notes for long and short-term goals.     Barriers to Discharge  Current Status/Progress  Possible Resolutions Date Resolved   Nursing                  PT                    OT                  SLP                SW                Discharge Planning/Teaching Needs:  Home with s/o to provide 24/7 assistance.  Teaching need TBD   Team Discussion: Old L AKA, new R AKA, w/c previously, VAC for a few more days, monitoring BP and DM.  RN - cont B/B, BS 181, covered.  OT min A overall.  PT S AP transfers with slideboard, S w/c mobility.   Revisions to Treatment Plan:  N/A     Medical Summary Current Status: right aka (prior left aka). bp, dm issues, morbidly obese. Weekly Focus/Goal: wound care, dm, pain, bp  Barriers to Discharge: Wound care   Possible Resolutions to Barriers: wound care follow up, education   Continued Need for Acute Rehabilitation Level of Care: The patient requires daily medical management by a physician with specialized training in physical medicine and rehabilitation for the following reasons: Direction of a multidisciplinary physical rehabilitation program to maximize functional independence : Yes Medical management of patient stability for increased activity during participation in an intensive rehabilitation regime.: Yes Analysis of laboratory values and/or radiology reports with any subsequent need for medication adjustment and/or medical intervention. : Yes   I attest that I was present, lead the team conference, and concur with the assessment and plan of the team.   Retta Diones 12/15/2018, 3:32 PM  Team conference was held via web/ teleconference due to [place] - 19

## 2018-12-15 NOTE — Progress Notes (Signed)
Pt slept well through out the night, no complaints at this time. Denies pain. Pt resting in bed a this time.

## 2018-12-15 NOTE — Progress Notes (Signed)
Inpatient Rehabilitation  Patient information reviewed and entered into eRehab system by Hang Ammon M. Mazy Culton, M.A., CCC/SLP, PPS Coordinator.  Information including medical coding, functional ability and quality indicators will be reviewed and updated through discharge.    

## 2018-12-15 NOTE — Progress Notes (Signed)
Patients CPAP setup and placed at bedside. Patient is able to place himself on and off as needed. Will call if any assistance needed.

## 2018-12-15 NOTE — Progress Notes (Signed)
Occupational Therapy Session Note  Patient Details  Name: [name] MRN: [id number] Date of Birth: 07/12/1950  Today's Date: 12/15/2018 OT Individual Time: 1331-1430 OT Individual Time Calculation (min): 59 min   Short Term Goals: Week 1:  OT Short Term Goal 1 (Week 1): LTG = STG 2.2 ELOS  Skilled Therapeutic Interventions/Progress Updates:  Pt greeted semi-reclined in bed and agreeable to OT treatment session. Pt completed AP transfer with  Bed height raised to wc with set-up/supervision A. Pt propelled wc to therapy apartment. Pt reported he had been using wide drop arm BSC with 2 legs out of shower to transfer into walk-in shower when he was non weight bearing. OT showed pt tub transfer bench and discussed uses for home walk-in shower. Pt may want to get a tub bench as he has to sit 1/2 way in the shower with his set-up now. AP transfer to therapy mat with set-up A. UB strength, core strength, and sitting balance with trunk rotation ball toss. Discussed pressure relief options with wc cushions and importance of pressure relief and ways to decrease shearing with transfers. Pt completed 6 mins on [employer] arm bike for U Bstrengthening on level 5. Pt propelled wc back to room and left seated in wc with call bell in reach and needs met.   Balance/vestibular training;Community reintegration;Discharge planning;DME/adaptive equipment instruction;Pain management;Patient/family education;Psychosocial support;Self Care/advanced ADL retraining;Therapeutic Activities;Therapeutic Exercise;UE/LE Strength taining/ROM;Wheelchair propulsion/positioning   Therapy Documentation Precautions:  Precautions Precautions: Fall Precaution Comments: wound VAC Restrictions Weight Bearing Restrictions: Yes RLE Weight Bearing: Non weight bearing LLE Weight Bearing: Non weight bearing Pain:  denies pain   Therapy/Group: Individual Therapy  Valma Cava 12/15/2018, 2:21 PM

## 2018-12-16 ENCOUNTER — Inpatient Hospital Stay (HOSPITAL_COMMUNITY): Payer: Medicare Other

## 2018-12-16 ENCOUNTER — Inpatient Hospital Stay (HOSPITAL_COMMUNITY): Payer: Medicare Other | Admitting: Physical Therapy

## 2018-12-16 LAB — GLUCOSE, CAPILLARY
Glucose-Capillary: 197 mg/dL — ABNORMAL HIGH (ref 70–99)
Glucose-Capillary: 201 mg/dL — ABNORMAL HIGH (ref 70–99)
Glucose-Capillary: 165 mg/dL — ABNORMAL HIGH (ref 70–99)
Glucose-Capillary: 249 mg/dL — ABNORMAL HIGH (ref 70–99)

## 2018-12-16 NOTE — Progress Notes (Signed)
[place] PHYSICAL MEDICINE & REHABILITATION PROGRESS NOTE   Subjective/Complaints: Had a good night. Finally moving bowels. Pain under reasonable control. No complaints this morning other than some soreness at residual limb incision site, unchanged from prior. Feels like every day he is able to move his residual limb a little better and has been doing ROM exercises himself in his bed.  Denies phantom limb sensation.   ROS: Patient denies fever, rash, sore throat, blurred vision, nausea, vomiting, diarrhea, cough, shortness of breath or chest pain, , headache, or mood change.    Objective:   No results found. Recent Labs    12/15/18 0540  WBC 10.1  HGB 11.2*  HCT 33.9*  PLT 282   Recent Labs    12/15/18 0540  NA 139  K 4.3  CL 101  CO2 25  GLUCOSE 192*  BUN 22  CREATININE 0.87  CALCIUM 9.0    Intake/Output Summary (Last 24 hours) at 12/16/2018 1035 Last data filed at 12/16/2018 0905 Gross per 24 hour  Intake 210 ml  Output 900 ml  Net -690 ml     Physical Exam: Vital Signs Blood pressure 131/64, pulse 87, temperature (!) 97.1 F (36.2 C), resp. rate 18, weight 119.6 kg, SpO2 96 %. Constitutional: No distress . Vital signs reviewed. Sitting up in bed.  HEENT: EOMI, oral membranes moist Neck: supple Cardiovascular: RRR without murmur. No JVD    Respiratory: CTA Bilaterally without wheezes or rales. Normal effort    GI: BS +, non-tender, non-distended  Musculoskeletal:  Comments: RLE remains in limb guard, vac. Left AKA well formed. Neurological: He is alertand oriented to person, place, and time. Nocranial nerve deficit. UE 5/5 prox to distal. Able to lift right off bed and move in all planes. LLE 4/5. No sensory deficits in residual LE's. Skin:No rashnoted. He is not diaphoretic. Noerythema. RLE with vac in place Psychiatric: pleasant and appropriate   Assessment/Plan: 1. Functional deficits secondary to right AKA which require 3+ hours per  day of interdisciplinary therapy in a comprehensive inpatient rehab setting.  Physiatrist is providing close team supervision and 24 hour management of active medical problems listed below.  Physiatrist and rehab team continue to assess barriers to discharge/monitor patient progress toward functional and medical goals  Care Tool:  Bathing    Body parts bathed by patient: Right arm, Left arm, Chest, Abdomen, Front perineal area, Right upper leg, Left upper leg, Face   Body parts bathed by helper: Buttocks Body parts n/a: Right lower leg, Left lower leg   Bathing assist Assist Level: Minimal Assistance - Patient > 75%     Upper Body Dressing/Undressing Upper body dressing   What is the patient wearing?: Pull over shirt    Upper body assist Assist Level: Supervision/Verbal cueing    Lower Body Dressing/Undressing Lower body dressing      What is the patient wearing?: Pants     Lower body assist Assist for lower body dressing: Minimal Assistance - Patient > 75%     Toileting Toileting    Toileting assist Assist for toileting: Minimal Assistance - Patient > 75% Assistive Device Comment: (urinal)   Transfers Chair/bed transfer  Transfers assist     Chair/bed transfer assist level: Supervision/Verbal cueing     Locomotion Ambulation   Ambulation assist   Ambulation activity did not occur: N/A(pt with B AKA)          Walk 10 feet activity   Assist  Walk 10 feet activity did  not occur: N/A        Walk 50 feet activity   Assist Walk 50 feet with 2 turns activity did not occur: N/A         Walk 150 feet activity   Assist Walk 150 feet activity did not occur: N/A         Walk 10 feet on uneven surface  activity   Assist Walk 10 feet on uneven surfaces activity did not occur: N/A         Wheelchair     Assist Will patient use wheelchair at discharge?: Yes Type of Wheelchair: Manual    Wheelchair assist level:  Supervision/Verbal cueing Max wheelchair distance: 250    Wheelchair 50 feet with 2 turns activity    Assist        Assist Level: Supervision/Verbal cueing   Wheelchair 150 feet activity     Assist  Wheelchair 150 feet activity did not occur: Safety/medical concerns   Assist Level: Supervision/Verbal cueing   Blood pressure 131/64, pulse 87, temperature (!) 97.1 F (36.2 C), resp. rate 18, weight 119.6 kg, SpO2 96 %.  Medical Problem List and Plan: 1.Functional and mobility deficitssecondary to right AKA (hx of left AKA 12/19 ) -patient maynot yetshower -ELOS/Goals: 7-10 days, mod I goals at w/c level 2. Antithrombotics: -DVT/anticoagulation:Pharmaceutical:Lovenox -antiplatelet therapy: ASA 3. Pain Management:oxycodone or tylenol PRN. 4. Mood:LCSW to follow for evaluation and support. -antipsychotic agents: N/A 5. Neuropsych: This patientiscapable of making decisions on hisown behalf. 6. Skin/Wound Care:Wound VAC for 7 daysthen apply appropriate dressing based on wound appearance. 7. Fluids/Electrolytes/Nutrition:Monitor I/O--discontinue IVF.    -I personally reviewed the patient's labs today.  WNL 8. T2DM: Monitor BS ac/hs. On metformin 1000 mg bid withTujeo40 units at bedtime. -adjust regimen as needed 9.HTN: Monitor BP tid--continue Cozaar/HCTZ Daily.  -reasonable control 12/8, 12/9  10. [id number] to use CPAP  11. H/o CAD: On ASA and coreg. 12. Mild constipation: results with miralax and senna-s  -use senna-s as a regular part of regimen   LOS: 2 days A FACE TO FACE EVALUATION WAS PERFORMED  Zaeem Kandel P Nakeysha Pasqual 12/16/2018, 10:35 AM

## 2018-12-16 NOTE — Progress Notes (Signed)
Cherise H, NT reported CBG 197

## 2018-12-16 NOTE — Progress Notes (Signed)
Occupational Therapy Session Note  Patient Details  Name: JAIVAN URE MRN: [id number] Date of Birth: 10-25-1950  Today's Date: 12/16/2018 OT Individual Time: 1100-1200 OT Individual Time Calculation (min): 60 min    Short Term Goals: Week 1:  OT Short Term Goal 1 (Week 1): LTG = STG 2.2 ELOS  Skilled Therapeutic Interventions/Progress Updates:    Pt resting in w/c upon arrival.  OT intervention with focus on a/p transfers, w/c mobility, bed mobility, BADL training at bed level, education, safety awareness, and activity tolerance to increase independence with BADLs.  Pt propelled w/c to small gym and performed a/p transfer and repositioning to side lying and prone on mat. Pt able to come back to sitting position with supervison.  Pt returned to w/c and propelled to room.  Pt transferred to bed to complete bathing/dressing tasks.  Pt required assistance with pulling pants over hips while rolling R<>L. Pt fatigues easily and requires rest breaks.  No unsafe behaviors noted.  Pt remained in bed with all needs within reach and bed alarm activated.   Therapy Documentation Precautions:  Precautions Precautions: Fall Precaution Comments: wound VAC Restrictions Weight Bearing Restrictions: Yes RLE Weight Bearing: Non weight bearing LLE Weight Bearing: Non weight bearing    Pain:  Pt denies pain this afternoon   Therapy/Group: Individual Therapy  Leroy Libman 12/16/2018, 12:47 PM

## 2018-12-16 NOTE — Progress Notes (Signed)
Physical Therapy Session Note  Patient Details  Name: [name] MRN: [id number] Date of Birth: Apr 18, 1950  Today's Date: 12/16/2018 PT Individual Time: 1300-1408 PT Individual Time Calculation (min): 68 min   Short Term Goals: Week 1:  PT Short Term Goal 1 (Week 1): STG = LTG due to short ELOS.  Skilled Therapeutic Interventions/Progress Updates:   Pt in supine and agreeable to therapy, denies pain. Supine>sit w/ supervision and supervision AP lateral scoot into w/c. Pt self-propelled w/c to outside of hospital, >500', w/ supervision using BUEs. Wife met therapist and pt outside to practice real car transfer. Pt w/ approx 8" curb at home that he uses to create a level transfer to/from front seat of his SUV. Pt performed slide board set-up appropriately. Pt's wife stabilized board from within car. Therapist stabilized w/c and board from behind. Therapist w/ hand under pt's bottom to reach board to stabilize it. Described this to both pt and wife, both state their daughter can assist w/ this at home. Pt performed AP transfer into front seat of car using BUEs to pull on seat and grab bar on ceiling of car. The slide board spanned ~8-10" of empty space between w/c and car seat, however it remained securely in place by therapist and pt's wife. Once pt was almost to seat, he turned to face forward in car. Performed opposite technique back to w/c as pt turned to transfer posteriorly just as he was exiting the car, utilized BUEs on armrests of w/c to scoot back. Therapist and wife also securing slide board for transfer back to w/c. Pt did not need any physical assist from therapist or wife, CGA for safety but assist only needed to secure slide board. Returned to unit via w/c. Ended session in w/c, all needs in reach.   Therapy Documentation Precautions:  Precautions Precautions: Fall Precaution Comments: wound VAC Restrictions Weight Bearing Restrictions: Yes RLE Weight Bearing: Non weight  bearing LLE Weight Bearing: Non weight bearing Pain: Pain Assessment Pain Scale: 0-10 Pain Score: 5   Therapy/Group: Individual Therapy  Kuba Shepherd K [place] 12/16/2018, 2:13 PM

## 2018-12-16 NOTE — Progress Notes (Signed)
Physical Therapy Session Note  Patient Details  Name: [name] MRN: [id number] Date of Birth: [birth year]  Today's Date: 12/16/2018 PT Individual Time: 0915-1015 PT Individual Time Calculation (min): 60 min   Short Term Goals: Week 1:  PT Short Term Goal 1 (Week 1): STG = LTG due to short ELOS.  Skilled Therapeutic Interventions/Progress Updates:  Pt seated in bed.  No c/o pain.  Posterior scoot bed> wc slightly dowhhill with supervision.  PT readjusted shrinker higher on pt's residual limb, with pt in sitting.  W/c propulsion over level tile x 250' with supervision.  Cues for set up for transfer.  PT moved cuff weights on w/c for better counter-weight function.  Up ramp with min/mod assist forward, close supervision backward (his method at home); down ramp with supervision, x 2.  Rolling on firm mat, no rails, min assist.  Supine> sit iwht min assist; sit> supine with supervision.  Therapeutic exercises performed with LE to increase strength for functional mobility in supine, 2 x 15 each- cervical flexion, bil adductor squeezes, R hip extension, R scapular protraction with extended elbow (pain in anterior shoulder when attempted L). In R/L side lying , 1 x 10 L/R hip extension.  At end of session, pt in w/c with seat belt alarm set and needs at hand.     Therapy Documentation Precautions:  Precautions Precautions: Fall Precaution Comments: wound VAC Restrictions Weight Bearing Restrictions: Yes RLE Weight Bearing: Non weight bearing LLE Weight Bearing: Non weight bearing       Therapy/Group: Individual Therapy  Kimball Manske 12/16/2018, 10:31 AM

## 2018-12-17 ENCOUNTER — Inpatient Hospital Stay (HOSPITAL_COMMUNITY): Payer: Medicare Other

## 2018-12-17 ENCOUNTER — Inpatient Hospital Stay (HOSPITAL_COMMUNITY): Payer: Medicare Other | Admitting: Occupational Therapy

## 2018-12-17 LAB — GLUCOSE, CAPILLARY
Glucose-Capillary: 220 mg/dL — ABNORMAL HIGH (ref 70–99)
Glucose-Capillary: 216 mg/dL — ABNORMAL HIGH (ref 70–99)
Glucose-Capillary: 197 mg/dL — ABNORMAL HIGH (ref 70–99)
Glucose-Capillary: 249 mg/dL — ABNORMAL HIGH (ref 70–99)

## 2018-12-17 MED ORDER — INSULIN GLARGINE 100 UNIT/ML ~~LOC~~ SOLN
50.0000 [IU] | Freq: Every day | SUBCUTANEOUS | Status: DC
  Administered 2018-12-17 – 2018-12-18 (×2): 50 [IU] via SUBCUTANEOUS
  Filled 2018-12-17 (×3): qty 0.5

## 2018-12-17 NOTE — Progress Notes (Signed)
Patient placed himself on CPAP with no assistance.

## 2018-12-17 NOTE — Progress Notes (Signed)
Social Work Patient ID: [name], male   DOB: Mar 21, 1950, 68 y.o.   MRN: [id number]  Pt and son aware and agreeable with plan for d/c on Sat 12/12.  Have referred to [facility] to restart HHPT,OT, RN.  No further concerns at this point.  Cerria Randhawa, LCSW

## 2018-12-17 NOTE — Progress Notes (Signed)
Physical Therapy Session Note  Patient Details  Name: IBRAHAM NEDD MRN: [id number] Date of Birth: Jan 17, 1950  Today's Date: 12/17/2018 PT Individual Time: 1300-1400 PT Individual Time Calculation (min): 60 min   Short Term Goals: Week 1:  PT Short Term Goal 1 (Week 1): STG = LTG due to short ELOS.  Skilled Therapeutic Interventions/Progress Updates:  Pt seated in w/c.  He stated pain R residual limb 4/10, premedicated.   W/c propulsion throughout unit modified independent, x 250.  Forward scoot w/c> mat level transfer with supervision.  Sit> supine with supervision. Rolling L><R on firm mat pulling with hands on side of mat, supervision and extra time.  Therapeutic exercises performed with LEs and trunk to increase strength for functional mobility: i10 x 15 each: n sitting on mat trunk extension to posterior BOS >< neutral trunk.  In supine: L/R hip extension over small towel roll, bil adductor squeezes, cervical flexion.  In L/R side lying: 10 x 1 each- R hip extension to tolerance >< from neutral , R hip abduction; 20 x 1 L hip extension, hip abduction.  Backward scoot mat> w/c with supervision.  At end of session, pt in w/c with needs at hand.  Discussed falls prevention; he verbalized that he will not get back to bed without assistance.     Therapy Documentation Precautions:  Precautions Precautions: Fall Precaution Comments: wound VAC Restrictions Weight Bearing Restrictions: Yes RLE Weight Bearing: Non weight bearing LLE Weight Bearing: Non weight bearing        Therapy/Group: Individual Therapy  Munira Polson 12/17/2018, 5:43 PM

## 2018-12-17 NOTE — Progress Notes (Signed)
Pt is able to place himself on CPAP dreamstation. RT filled humidification reservoir with sterile water and place within pt reach. Pt set on his home setting of 5 cmH2O w/no oxygen bled into the system. RT will continue to monitor.

## 2018-12-17 NOTE — Progress Notes (Signed)
Occupational Therapy Session Note  Patient Details  Name: [name] MRN: [id number] Date of Birth: 01/04/1951  Today's Date: 12/17/2018 OT Individual Time: 1000-1100 OT Individual Time Calculation (min): 60 min    Short Term Goals: Week 1:  OT Short Term Goal 1 (Week 1): LTG = STG 2.2 ELOS  Skilled Therapeutic Interventions/Progress Updates:    Pt resting in w/c upon arrival and agreeable to therapy.  OT intervention with focus on w/c mobility, functional transfers, discharge planning, abd BUE therex to increase independence with BADLs. W/c mobility in simulated home environment with supervision. A/p transfers with supervison. Slide board transfers with supervision. BUE therex on SciFit (7 mins X 2 on Random load 5 avg rpm at 40). PT also used 3# bar for rowing exercises (10x3). Pt returned to room and remained in w/c with all needs within reach and belt alarm activated.   Therapy Documentation Precautions:  Precautions Precautions: Fall Precaution Comments: wound VAC Restrictions Weight Bearing Restrictions: Yes RLE Weight Bearing: Non weight bearing LLE Weight Bearing: Non weight bearing   Pain: Pt denies pain this morning  Therapy/Group: Individual Therapy  Leroy Libman 12/17/2018, 12:30 PM

## 2018-12-17 NOTE — Progress Notes (Signed)
[facility] PHYSICAL MEDICINE & REHABILITATION PROGRESS NOTE   Subjective/Complaints: No new issues. Pain controlled. Moving bowels.  ROS: Patient denies fever, rash, sore throat, blurred vision, nausea, vomiting, diarrhea, cough, shortness of breath or chest pain, joint or back pain, headache, or mood change.    Objective:   No results found. Recent Labs    12/15/18 0540  WBC 10.1  HGB 11.2*  HCT 33.9*  PLT 282   Recent Labs    12/15/18 0540  NA 139  K 4.3  CL 101  CO2 25  GLUCOSE 192*  BUN 22  CREATININE 0.87  CALCIUM 9.0    Intake/Output Summary (Last 24 hours) at 12/17/2018 0950 Last data filed at 12/17/2018 0528 Gross per 24 hour  Intake 360 ml  Output 475 ml  Net -115 ml     Physical Exam: Vital Signs Blood pressure 130/67, pulse 89, temperature 98.5 F (36.9 C), temperature source Oral, resp. rate 18, weight 119.6 kg, SpO2 96 %. Constitutional: No distress . Vital signs reviewed. HEENT: EOMI, oral membranes moist Neck: supple Cardiovascular: RRR without murmur. No JVD    Respiratory: CTA Bilaterally without wheezes or rales. Normal effort    GI: BS +, non-tender, non-distended   Musculoskeletal:  Comments: RLE remains in limb guard, vac. Left AKA well formed. Neurological: He is alertand oriented to person, place, and time. Nocranial nerve deficit. UE 5/5 prox to distal. Able to lift right off bed and move in all planes. LLE 4/5. No sensory deficits in residual LE's. Skin:No rashnoted. He is not diaphoretic. Noerythema. RLE with vac in placewith diminishing drainage Psychiatric: pleasant   Assessment/Plan: 1. Functional deficits secondary to right AKA which require 3+ hours per day of interdisciplinary therapy in a comprehensive inpatient rehab setting.  Physiatrist is providing close team supervision and 24 hour management of active medical problems listed below.  Physiatrist and rehab team continue to assess barriers to  discharge/monitor patient progress toward functional and medical goals  Care Tool:  Bathing    Body parts bathed by patient: Right arm, Left arm, Chest, Abdomen, Front perineal area, Right upper leg, Left upper leg, Face, Buttocks   Body parts bathed by helper: Buttocks Body parts n/a: Left lower leg, Right lower leg   Bathing assist Assist Level: Minimal Assistance - Patient > 75%     Upper Body Dressing/Undressing Upper body dressing   What is the patient wearing?: Pull over shirt    Upper body assist Assist Level: Supervision/Verbal cueing    Lower Body Dressing/Undressing Lower body dressing      What is the patient wearing?: Pants     Lower body assist Assist for lower body dressing: Minimal Assistance - Patient > 75%     Toileting Toileting    Toileting assist Assist for toileting: Minimal Assistance - Patient > 75% Assistive Device Comment: (urinal)   Transfers Chair/bed transfer  Transfers assist     Chair/bed transfer assist level: Supervision/Verbal cueing     Locomotion Ambulation   Ambulation assist   Ambulation activity did not occur: N/A(pt with B AKA)          Walk 10 feet activity   Assist  Walk 10 feet activity did not occur: N/A        Walk 50 feet activity   Assist Walk 50 feet with 2 turns activity did not occur: N/A         Walk 150 feet activity   Assist Walk 150 feet activity did not  occur: N/A         Walk 10 feet on uneven surface  activity   Assist Walk 10 feet on uneven surfaces activity did not occur: N/A         Wheelchair     Assist Will patient use wheelchair at discharge?: Yes Type of Wheelchair: Manual    Wheelchair assist level: Supervision/Verbal cueing Max wheelchair distance: 250    Wheelchair 50 feet with 2 turns activity    Assist        Assist Level: Supervision/Verbal cueing   Wheelchair 150 feet activity     Assist  Wheelchair 150 feet activity did not  occur: Safety/medical concerns   Assist Level: Supervision/Verbal cueing   Blood pressure 130/67, pulse 89, temperature 98.5 F (36.9 C), temperature source Oral, resp. rate 18, weight 119.6 kg, SpO2 96 %.  Medical Problem List and Plan: 1.Functional and mobility deficitssecondary to right AKA (hx of left AKA 12/19 ) -patient [place] with vac disconnected  -dc home tomorrow  -Patient to see Dr. Ranell Patrick in the office for transitional care encounter in 1-2 weeks.  2. Antithrombotics: -DVT/anticoagulation:Pharmaceutical:Lovenox -antiplatelet therapy: ASA 3. Pain Management:oxycodone or tylenol PRN. 4. Mood:LCSW to follow for evaluation and support. -antipsychotic agents: N/A 5. Neuropsych: This patientiscapable of making decisions on hisown behalf. 6. Skin/Wound Care:remove vac tomorrow 7. Fluids/Electrolytes/Nutrition:eating well 8. T2DM: Monitor BS ac/hs. On metformin 1000 mg bid withlantus40 units at bedtime. -poorly controlled.   - on toujeo 55 u at bedtime at home. Increase lantus to 50u tonight 9.HTN: Monitor BP tid--continue Cozaar/HCTZ Daily.  -reasonable control 12/10  10. [id number] to use CPAP  11. H/o CAD: On ASA and coreg. 12. Mild constipation: results with miralax and senna-s  - senna-s as a regular part of regimen   LOS: 3 days A FACE TO FACE EVALUATION WAS PERFORMED  Meredith Staggers 12/17/2018, 9:50 AM

## 2018-12-17 NOTE — IPOC Note (Signed)
Overall Plan of Care [facility]) Patient Details Name: [name] MRN: [id number] DOB: 1950-03-14  Admitting Diagnosis: Above knee amputation of right lower extremity [facility])  Hospital Problems: Principal Problem:   Above knee amputation of right lower extremity ([place])     Functional Problem List: Nursing Bowel, Endurance, Motor, Pain, Medication Management, Skin Integrity, Safety, Sensory  PT Balance, Sensory, Endurance, Pain, Motor  OT Balance, Endurance, Pain  SLP    TR         Basic ADL's: OT Grooming, Bathing, Dressing, Toileting     Advanced  ADL's: OT       Transfers: PT Bed Mobility, Bed to Chair, Car, Manufacturing systems engineer, Metallurgist: PT Stairs, Emergency planning/management officer     Additional Impairments: OT None  SLP        TR      Anticipated Outcomes Item Anticipated Outcome  Self Feeding Independet  Swallowing      Basic self-care  Supervision/Mod I  Toileting  Mod I   Bathroom Transfers Supervision  Bowel/Bladder  Mod I assist  Transfers  set up<>mod I with LRAD  Locomotion  mod I w/c level  Communication     Cognition     Pain  < 4  Safety/Judgment  Mod I assist   Therapy Plan: PT Intensity: Minimum of 1-2 x/day ,45 to 90 minutes PT Frequency: 5 out of 7 days PT Duration Estimated Length of Stay: 5-7 days OT Intensity: Minimum of 1-2 x/day, 45 to 90 minutes OT Frequency: 5 out of 7 days OT Duration/Estimated Length of Stay: 5-7 days     Due to the current state of emergency, patients may not be receiving their 3-hours of Medicare-mandated therapy.   Team Interventions: Nursing Interventions Patient/Family Education, Pain Management, Medication Management, Discharge Planning, Bowel Management, Skin Care/Wound Management, Psychosocial Support, Disease Management/Prevention  PT interventions Community reintegration, DME/adaptive equipment instruction, Neuromuscular re-education, Psychosocial support, Stair training, UE/LE  Strength taining/ROM, Wheelchair propulsion/positioning, UE/LE Coordination activities, Therapeutic Activities, Skin care/wound management, Pain management, Functional electrical stimulation, Discharge planning, Training and development officer, Disease management/prevention, Functional mobility training, Patient/family education, Splinting/orthotics, Visual/perceptual remediation/compensation, Therapeutic Exercise  OT Interventions Training and development officer, Community reintegration, Discharge planning, DME/adaptive equipment instruction, Pain management, Patient/family education, Psychosocial support, Self Care/advanced ADL retraining, Therapeutic Activities, Therapeutic Exercise, UE/LE Strength taining/ROM, Wheelchair propulsion/positioning  SLP Interventions    TR Interventions    SW/CM Interventions Discharge Planning, Psychosocial Support, Patient/Family Education   Barriers to Discharge MD  Wound care  Nursing      PT      OT      SLP      SW       Team Discharge Planning: Destination: PT-Home ,OT- Home , SLP-  Projected Follow-up: PT-Home health PT, OT-  Home health OT, SLP-  Projected Equipment Needs: PT-To be determined, OT- To be determined, SLP-  Equipment Details: PT- , OT-  Patient/family involved in discharge planning: PT- Patient,  OT-Patient, SLP-   MD ELOS: 12/19/2018 Medical Rehab Prognosis:  Excellent Assessment: The patient has been admitted for CIR therapies with the diagnosis of right aka, prior left aka. The team will be addressing functional mobility, strength, stamina, balance, safety, adaptive techniques and equipment, self-care, bowel and bladder mgt, patient and caregiver education, pain mgt, wound care, w/c assessment. Goals have been set at mod I for mobility and selfcare. Will need home wound care at dc.   Due to the current state of emergency, patients may not be  receiving their 3 hours per day of Medicare-mandated therapy.    Meredith Staggers, MD,  FAAPMR      See Team Conference Notes for weekly updates to the plan of care

## 2018-12-17 NOTE — Progress Notes (Addendum)
Occupational Therapy Session Note  Patient Details  Name: [name] MRN: [id number] Date of Birth: 1950/07/20  Today's Date: 12/17/2018 OT Individual Time: 8144-8185 OT Individual Time Calculation (min): 58 min    Short Term Goals: Week 1:  OT Short Term Goal 1 (Week 1): LTG = STG 2.2 ELOS  Skilled Therapeutic Interventions/Progress Updates:    Pt greeted in long sitting and agreeable to OT treatment session. Pt reported need to go to the bathroom. Pt doffed pants in bed with supervision. Pt completed AP transfer to wide BSC with supervision and bed raised up. Pt with successful BM and voided bladder. Set-up for peri-care and able to complete with hip hike. Pt completed bathing sitting on BSC with set-up A. Pt completed AP transfer back to bed with bed lowered. Pt needed min A to don pants to get over wound vac and to pull up R side hip/. Pt complete AP transfer back to wc with set-up A. Pt completed grooming tasks at the sink including toothbrushing, hair washing, and shaving. Pt left seated in wc at end of of session with call bell in reach and needs met.   Therapy Documentation Precautions:  Precautions Precautions: Fall Precaution Comments: wound VAC Restrictions Weight Bearing Restrictions: Yes RLE Weight Bearing: Non weight bearing LLE Weight Bearing: Non weight bearing Pain: Pain Assessment Pain Scale: 0-10 Pain Score: 6  Pain Type: Surgical pain Pain Location: Leg Pain Orientation: Right Pain Descriptors / Indicators: Aching;Discomfort   Therapy/Group: Individual Therapy  Valma Cava 12/17/2018, 12:52 PM

## 2018-12-17 NOTE — Progress Notes (Signed)
Social Work Assessment and Plan   Patient Details  Name: [name] MRN: [id number] Date of Birth: 24-Sep-1950  Today's Date: 12/17/2018  Problem List:  Patient Active Problem List   Diagnosis Date Noted  . Above knee amputation of right lower extremity ([place]) 12/14/2018  . Atherosclerosis of native artery of right leg with gangrene ([place]) 12/11/2018  . Hypoalbuminemia due to protein-calorie malnutrition ([place])   . Type 2 diabetes mellitus with peripheral neuropathy (H[facility])   . Left above-knee amputee ([place]) 12/26/2017  . Unilateral AKA, left ([place])   . History of complete ray amputation of fifth toe of right foot ([place])   . Postoperative pain   . Essential hypertension   . Atherosclerosis of native arteries of extremities with gangrene, right leg ([place]) 02/19/2017  . Chronic renal disease, stage III 02/19/2017  . Ulcer of toe of right foot, limited to breakdown of skin ([place]) 10/11/2016  . Type 2 diabetes mellitus with diabetic polyneuropathy, with long-term current use of insulin ([place]) 03/29/2015  . Advance directive discussed with patient 02/07/2014  . Normocytic anemia 04/01/2013  . Diabetic Charcot foot ([place]) 04/01/2013  . Routine general medical examination at a health care facility 12/25/2010  . PERSONAL HX COLONIC POLYPS 11/22/2009  . Venous (peripheral) insufficiency 06/29/2008  . Osteoarthritis, multiple sites 04/03/2007  . ERECTILE DYSFUNCTION, ORGANIC 06/13/2006  . Hyperlipemia 06/11/2006  . GLAUCOMA 06/11/2006  . Coronary atherosclerosis of native coronary artery 06/11/2006  . Obstructive sleep apnea 06/11/2006   Past Medical History:  Past Medical History:  Diagnosis Date  . Cellulitis and abscess of left lower extremity 12/20/2017  . Coronary atherosclerosis of unspecified type of vessel, native or graft   . Diabetes mellitus without complication ([place])    type 2  . Hemorrhage of rectum and anus   . History of kidney stones    passed stones  . Impotence of organic  origin   . Internal hemorrhoids without mention of complication   . Left below-knee amputee ([place]) 02/23/2016  . Malignant neoplasm of prostate ([place])   . Mononeuritis of unspecified site   . Osteoarthrosis, unspecified whether generalized or localized, unspecified site   . Other and unspecified hyperlipidemia   . Peripheral vascular disease ([place])   . Personal history of colonic polyps   . Personal history of diabetic foot ulcer    saw wound center, resolved 05/08/2010  . Personal history of gallstones   . Routine general medical examination at a health care facility   . Subacute osteomyelitis, right ankle and foot ([place])   . Type II or unspecified type diabetes mellitus with neurological manifestations, not stated as uncontrolled(250.60)   . Unspecified glaucoma(365.9)   . Unspecified sleep apnea    cpcp  . Unspecified venous (peripheral) insufficiency    Past Surgical History:  Past Surgical History:  Procedure Laterality Date  . AMPUTATION Left 12/30/2012   Procedure: AMPUTATION RAY ;  Surgeon: Meredith Pel, MD;  Location: [facility];  Service: Orthopedics;  Laterality: Left;  LEFT GREAT TOE RAY AMPUTATION  . AMPUTATION Left 02/23/2016   Procedure: Left Below Knee Amputation;  Surgeon: [name], MD;  Location: [facility];  Service: Orthopedics;  Laterality: Left;  . AMPUTATION Left 12/24/2017   Procedure: LEFT ABOVE KNEE AMPUTATION;  Surgeon: [name], MD;  Location: [facility];  Service: Orthopedics;  Laterality: Left;  . AMPUTATION Right 12/24/2017   Procedure: RIGHT 5TH RAY AMPUTATION;  Surgeon: [name], MD;  Location: [facility];  Service: Orthopedics;  Laterality: Right;  . AMPUTATION Right 12/11/2018   Procedure: RIGHT ABOVE KNEE AMPUTATION;  Surgeon: [name], MD;  Location: [facility];  Service: Orthopedics;  Laterality: Right;  . APPENDECTOMY    . BASAL CELL CARCINOMA EXCISION  2/16   left forearm  . BELPHAROPTOSIS REPAIR    . CARDIAC CATHETERIZATION  1998   Negative   . CATARACT EXTRACTION Right 2017   then lid surgery  . COLONOSCOPY    . CORONARY ARTERY BYPASS GRAFT  09/2005   5 bypasses per patient, Post op AFIB  . EYE SURGERY    . FOOT BONE EXCISION Left 11/03/2013   DR DUDA   . I&D EXTREMITY Left 11/03/2013   Procedure: Left Foot Partial Bone Excision Cuboid and Medial Cuneiform, Wound Closures;  Surgeon: [name], MD;  Location: [facility];  Service: Orthopedics;  Laterality: Left;  . INSERTION PROSTATE RADIATION SEED  2009   RT and seeds for prostate cancer  . KIDNEY STONE SURGERY  04/1993  . RCA stents  04/2003   EF 55%  . RETINAL DETACHMENT SURGERY  2002-2003  . thrombosed vein  1993   Right leg   Social History:  reports that he quit smoking about 17 years ago. His smoking use included cigars and cigarettes. He has a 70.00 pack-year smoking history. He has never used smokeless tobacco. He reports previous alcohol use. He reports that he does not use drugs.  Family / Support Systems Marital Status: Widow/Widower Patient Roles: Partner Spouse/Significant Other: Dyanne Iha @ [phone] Children: son, Griffen Luzzi @ [phone] Anticipated Caregiver: [name] Ability/Limitations of Caregiver: no limits Family Dynamics: Pt notes that gf and family are all very supportive and have assisted him with all needs after prior hospitalizations.  Social History Preferred language: English Religion: Christian Cultural Background: NA Read: Yes Write: Yes Employment Status: Retired Public relations account executive Issues: None Guardian/Conservator: None - per MD, pt is capable of making decisions on his own behalf.   Abuse/Neglect Abuse/Neglect Assessment Can Be Completed: Yes Physical Abuse: Denies Verbal Abuse: Denies Sexual Abuse: Denies Exploitation of patient/patient's resources: Denies Self-Neglect: Denies  Emotional Status Pt's affect, behavior and adjustment status: Pt sitting up with w/c, very pleasant and able to complete  assessment interview without any difficulty.  He is familiar with CIR from prior stays.  He denies any significant emotional distress, however, will monitor. Recent Psychosocial Issues: prior amputations Psychiatric History: None Substance Abuse History: None  Patient / Family Perceptions, Expectations & Goals Pt/Family understanding of illness & functional limitations: Pt and family with good understanding of medical issues which resulted in need for further surgery. Premorbid pt/family roles/activities: Pt was independent overall, however, notes community level is limited. Anticipated changes in roles/activities/participation: No change anticipated as gf and family were assisting as needed PTA. Pt/family expectations/goals: "I just need to keep working."  [employer]: None Premorbid Home Care/DME Agencies: Other (Comment)([facility]) Transportation available at discharge: yes  Discharge Planning Living Arrangements: Spouse/significant other Support Systems: Spouse/significant other, Children Type of Residence: Private residence Insurance Resources: [employer], Multimedia programmer (specify) Financial Resources: [place] Referred: No Living Expenses: Own Money Management: Patient Does the patient have any problems obtaining your medications?: No Home Management: pt and gf Patient/Family Preliminary Plans: Pt to return home with gf and family providing any needed assist Social Work Anticipated Follow Up Needs: HH/OP Expected length of stay: ELOS 7 to 10 days  Clinical Impression Pleasant gentleman who returns to  CIR following another amputations.  Very motivated and familiar with program.  Denies any concerns about assistance at d/c.  Support still from gf and children.  Will follow for d/c planning needs.  Terell Kincy 12/17/2018, 11:25 AM

## 2018-12-17 NOTE — Care Management (Signed)
[facility] Individual Statement of Services  Patient Name:  [name]  Date:  12/17/2018  Welcome to the [facility].  Our goal is to provide you with an individualized program based on your diagnosis and situation, designed to meet your specific needs.  With this comprehensive rehabilitation program, you will be expected to participate in at least 3 hours of rehabilitation therapies Monday-Friday, with modified therapy programming on the weekends.  Your rehabilitation program will include the following services:  Physical Therapy (PT), Occupational Therapy (OT), 24 hour per day rehabilitation nursing, Case Management (Social Worker), Rehabilitation Medicine, Nutrition Services and Pharmacy Services  Weekly team conferences will be held on [place] to discuss your progress.  Your Social Worker will talk with you frequently to get your input and to update you on team discussions.  Team conferences with you and your family in attendance may also be held.  Expected length of stay: 7 -10 days   Overall anticipated outcome: modified independent @ w/c  Depending on your progress and recovery, your program may change. Your Social Worker will coordinate services and will keep you informed of any changes. Your Social Worker's name and contact numbers are listed  below.  The following services may also be recommended but are not provided by the [facility] will be made to provide these services after discharge if needed.  Arrangements include referral to agencies that provide these services.  Your insurance has been verified to be:  Medicare; [employer] Your primary doctor is:  [name]  Pertinent information will be shared with your doctor and your insurance company.  Social Worker:  [place], [place] or ([phone]   Information discussed with and copy given to patient by: Lennart Pall, 12/17/2018, 11:26 AM

## 2018-12-18 ENCOUNTER — Inpatient Hospital Stay (HOSPITAL_COMMUNITY): Payer: Medicare Other | Admitting: Physical Therapy

## 2018-12-18 ENCOUNTER — Inpatient Hospital Stay (HOSPITAL_COMMUNITY): Payer: Medicare Other | Admitting: Occupational Therapy

## 2018-12-18 ENCOUNTER — Inpatient Hospital Stay (HOSPITAL_COMMUNITY): Payer: Medicare Other

## 2018-12-18 LAB — GLUCOSE, CAPILLARY
Glucose-Capillary: 198 mg/dL — ABNORMAL HIGH (ref 70–99)
Glucose-Capillary: 184 mg/dL — ABNORMAL HIGH (ref 70–99)
Glucose-Capillary: 285 mg/dL — ABNORMAL HIGH (ref 70–99)

## 2018-12-18 MED ORDER — OXYCODONE HCL 10 MG PO TABS: 5.0000 mg | ORAL_TABLET | Freq: Three times a day (TID) | ORAL | 0 refills | Status: DC | PRN

## 2018-12-18 MED ORDER — ACETAMINOPHEN 325 MG PO TABS: 325.0000 mg | ORAL_TABLET | ORAL | Status: DC | PRN

## 2018-12-18 MED ORDER — SENNOSIDES-DOCUSATE SODIUM 8.6-50 MG PO TABS: 2.0000 | ORAL_TABLET | Freq: Every day | ORAL | Status: DC

## 2018-12-18 MED ORDER — METHOCARBAMOL 500 MG PO TABS: 500.0000 mg | ORAL_TABLET | Freq: Four times a day (QID) | ORAL | 0 refills | Status: DC | PRN

## 2018-12-18 NOTE — Progress Notes (Signed)
[place] PHYSICAL MEDICINE & REHABILITATION PROGRESS NOTE   Subjective/Complaints: Up at chair. In good spirits. Pain controlled. Feels prepared to go home tomorrow  ROS: Patient denies fever, rash, sore throat, blurred vision, nausea, vomiting, diarrhea, cough, shortness of breath or chest pain, joint or back pain, headache, or mood change.     Objective:   No results found. No results for input(s): WBC, HGB, HCT, PLT in the last 72 hours. No results for input(s): NA, K, CL, CO2, GLUCOSE, BUN, CREATININE, CALCIUM in the last 72 hours.  Intake/Output Summary (Last 24 hours) at 12/18/2018 1044 Last data filed at 12/18/2018 0742 Gross per 24 hour  Intake 860 ml  Output 1125 ml  Net -265 ml     Physical Exam: Vital Signs Blood pressure (!) 135/57, pulse 90, temperature 98.5 F (36.9 C), temperature source Oral, resp. rate 18, weight 119.6 kg, SpO2 97 %. Constitutional: No distress . Vital signs reviewed. HEENT: EOMI, oral membranes moist Neck: supple Cardiovascular: RRR without murmur. No JVD    Respiratory: CTA Bilaterally without wheezes or rales. Normal effort    GI: BS +, non-tender, non-distended   Musculoskeletal:  Comments: Right AK with shrinker, vac Neurological: He is alertand oriented to person, place, and time. Nocranial nerve deficit. UE 5/5 prox to distal. Able to lift right off bed and move in all planes. LLE 4/5. No sensory deficits seen Skin:No rashnoted. He is not diaphoretic. Noerythema. RLE with vac in placewithout drainagePsychiatric: pleasant   Assessment/Plan: 1. Functional deficits secondary to right AKA which require 3+ hours per day of interdisciplinary therapy in a comprehensive inpatient rehab setting.  Physiatrist is providing close team supervision and 24 hour management of active medical problems listed below.  Physiatrist and rehab team continue to assess barriers to discharge/monitor patient progress toward functional and  medical goals  Care Tool:  Bathing    Body parts bathed by patient: Right arm, Left arm, Chest, Abdomen, Front perineal area, Right upper leg, Left upper leg, Face, Buttocks   Body parts bathed by helper: Buttocks Body parts n/a: Left lower leg, Right lower leg   Bathing assist Assist Level: Set up assist     Upper Body Dressing/Undressing Upper body dressing   What is the patient wearing?: Pull over shirt    Upper body assist Assist Level: Independent    Lower Body Dressing/Undressing Lower body dressing      What is the patient wearing?: Pants     Lower body assist Assist for lower body dressing: Independent with assitive device     Toileting Toileting    Toileting assist Assist for toileting: Independent with assistive device Assistive Device Comment: urinal   Transfers Chair/bed transfer  Transfers assist     Chair/bed transfer assist level: Supervision/Verbal cueing     Locomotion Ambulation   Ambulation assist   Ambulation activity did not occur: N/A(pt with B AKA)          Walk 10 feet activity   Assist  Walk 10 feet activity did not occur: N/A        Walk 50 feet activity   Assist Walk 50 feet with 2 turns activity did not occur: N/A         Walk 150 feet activity   Assist Walk 150 feet activity did not occur: N/A         Walk 10 feet on uneven surface  activity   Assist Walk 10 feet on uneven surfaces activity did not occur: N/A  Wheelchair     Assist Will patient use wheelchair at discharge?: Yes Type of Wheelchair: Manual    Wheelchair assist level: Independent Max wheelchair distance: 250    Wheelchair 50 feet with 2 turns activity    Assist        Assist Level: Independent   Wheelchair 150 feet activity     Assist  Wheelchair 150 feet activity did not occur: Safety/medical concerns   Assist Level: Independent   Blood pressure (!) 135/57, pulse 90, temperature 98.5 F  (36.9 C), temperature source Oral, resp. rate 18, weight 119.6 kg, SpO2 97 %.  Medical Problem List and Plan: 1.Functional and mobility deficitssecondary to right AKA (hx of left AKA 12/19 )   -dc home 12/12  -Patient to see Dr. Ranell Patrick in the office for transitional care encounter in 1-2 weeks.  2. Antithrombotics: -DVT/anticoagulation:Pharmaceutical:Lovenox -antiplatelet therapy: ASA 3. Pain Management:oxycodone or tylenol PRN. 4. Mood:LCSW to follow for evaluation and support. -antipsychotic agents: N/A 5. Neuropsych: This patientiscapable of making decisions on hisown behalf. 6. Skin/Wound Care:remove vac today---will follow up with RN re: dressing 7. Fluids/Electrolytes/Nutrition:eating well 8. T2DM: Monitor BS ac/hs. On metformin 1000 mg bid withlantus40 units at bedtime. -poorly controlled.   - on toujeo 55 u at bedtime at home. Increased lantus to 50u last night  -can go home on full toujeo dose at discharge 9.HTN: Monitor BP tid--continue Cozaar/HCTZ Daily.  -reasonable control 12/11 10. [id number] to use CPAP  11. H/o CAD: On ASA and coreg. 12. Mild constipation: results with miralax and senna-s  - senna-s as a regular part of regimen   LOS: 4 days A FACE TO FACE EVALUATION WAS PERFORMED  Meredith Staggers 12/18/2018, 10:44 AM

## 2018-12-18 NOTE — Progress Notes (Signed)
Occupational Therapy Session Note  Patient Details  Name: [name] MRN: [id number] Date of Birth: [birth year]  Today's Date: 12/18/2018 OT Individual Time: [id number] OT Individual Time Calculation (min): 55 min    Short Term Goals: Week 1:  OT Short Term Goal 1 (Week 1): LTG = STG 2.2 ELOS  Skilled Therapeutic Interventions/Progress Updates:    Pt sitting up in bed upon arrival.  OT intervention with focus on bathing/dressing at bed level, functional transfers, bed mobility, w/c mobility, discharge planning, and activity tolerance to prepare for possible discharge tomorrow. Pt completes bathing tasks with set up assist.  Pt performs dressing tasks with mod I at bed level rolling R<>L to pull pants over hips.Pt performs a/p transfer to w/c with supervision. Pt completed grooming tasks at sink without assistance.  Pt states he will be getting a tub transfer bench for use at home.  Pt remained in w/c with all needs within reach and wound vac plugged in.   Therapy Documentation Precautions:  Precautions Precautions: Fall Precaution Comments: wound VAC Restrictions Weight Bearing Restrictions: Yes RLE Weight Bearing: Non weight bearing LLE Weight Bearing: Non weight bearing  Pain:  Pt denies pain this morning   Therapy/Group: Individual Therapy  Leroy Libman 12/18/2018, 9:00 AM

## 2018-12-18 NOTE — Discharge Summary (Signed)
Physician Discharge Summary  Patient ID: [name] MRN: [id number] DOB/AGE: 09-08-1950 68 y.o.  Admit date: 12/14/2018 Discharge date: 12/18/2018  Discharge Diagnoses:  Principal Problem:   Above knee amputation of right lower extremity ([place]) Active Problems:   Leucocytosis   Type 2 diabetes mellitus with diabetic polyneuropathy, with long-term current use of insulin (HCC)   Acute blood loss anemia   Essential hypertension   Discharged Condition: stable  Significant Diagnostic Studies: No results found.  Labs:  Basic Metabolic Panel: BMP Latest Ref Rng & Units 12/15/2018 12/11/2018 01/02/2018  Glucose 70 - 99 mg/dL 192(H) 197(H) 226(H)  BUN 8 - 23 mg/dL 22 24(H) 24(H)  Creatinine 0.61 - 1.24 mg/dL 0.87 1.08 0.93  Sodium 135 - 145 mmol/L 139 136 138  Potassium 3.5 - 5.1 mmol/L 4.3 4.3 4.1  Chloride 98 - 111 mmol/L 101 103 105  CO2 22 - 32 mmol/L 25 23 23   Calcium 8.9 - 10.3 mg/dL 9.0 8.9 8.7(L)    CBC: CBC Latest Ref Rng & Units 12/15/2018 12/13/2018 12/12/2018  WBC 4.0 - 10.5 K/uL 10.1 10.5 15.7(H)  Hemoglobin 13.0 - 17.0 g/dL 11.2(L) 11.1(L) 11.5(L)  Hematocrit 39.0 - 52.0 % 33.9(L) 34.0(L) 35.5(L)  Platelets 150 - 400 K/uL 282 257 295    CBG: Recent Labs  Lab 12/18/18 0643 12/18/18 1143 12/18/18 1636 12/18/18 2116 12/19/18 0618  GLUCAP 198* 196* 184* 285* 221*    Brief HPI:   [name] is a 68 y.o. male with history of CAD,  T2DM with peripheral neuropathy, PVD s/p L-BKA 12/2017 and and right shin ulcers which are being managed conservatively on outpatient basis.  He continued to have progressive gangrenous changes with foul-smelling drainage and was admitted on 12/11/2018 for right BKA by Dr. [name].  Postop has had acute blood loss anemia and reactive leukocytosis is resolving.  Therapy is initiated and patient was noted to be limited by balance deficits and weakness.  CIR was recommended due to functional decline.   Hospital Course: [name] was  admitted to rehab 12/14/2018 for inpatient therapies to consist of PT and OT at least three hours five days a week. Past admission physiatrist, therapy team and rehab RN have worked together to provide customized collaborative inpatient rehab.  Pain control is reasonable with as needed use of oxycodone.  Blood pressures were monitored on TID basis and is currently controlled on home regimen.  Follow-up labs shows renal status to be stable.  CBC done revealing that leukocytosis is resolving and acute blood loss anemia is stable.    His blood sugars have been monitored with ac/hs CBG checks and SSI was use prn for tighter BS control.  Lantus has been titrated upwards to home dose.  He was also advised to resume his home regimen of sliding scale insulin.  Wound VAC was removed on 12/11 and incision is intact with minimal drainage along wound edges.  Anticipate drainage will resolve in 24 to 48 hours.  He was advised to use dry dressing as well as stump shrinker to help with edema control.  He has made good gains during his rehab stay and is currently at modified independent level.  He will continue to receive further follow-up home health PT, OT and RN via [facility] after discharge   Rehab course: During patient's stay in rehab weekly team conferences were held to monitor patient's progress, set goals and discuss barriers to discharge. At admission, patient required supervision with mobility and min  assist with basic self care tasks. He  has had improvement in activity tolerance, balance, postural control as well as ability to compensate for deficits.  He is able to complete ADL tasks at modified independent level with set up assist.  He is modified independent for sliding board transfers and is able to propel his wheelchair on level surfaces at modified independent level.  Discharge disposition: 01-Home or Self Care  Diet: Carb modified  Special Instructions: 1.  Cleanse with soap and water and pat  dry.  Wear stump shrinker daily. 2. Monitor blood sugars ac/hs.    Discharge Instructions    Ambulatory referral to Physical Medicine Rehab   Complete by: As directed    1-2 weeks TC appt     Allergies as of 12/19/2018      Reactions   Atorvastatin Other (See Comments)   myalgias   Ezetimibe Other (See Comments)   Body ache   Simvastatin Other (See Comments)   Body ache      Medication List    STOP taking these medications   naproxen sodium 220 MG tablet Commonly known as: ALEVE     TAKE these medications   Accu-Chek Guide test strip Generic drug: glucose blood USE AS INSTRUCTED TO CHECK SUGAR 3 TIMES DAILY DX- E11.42   acetaminophen 325 MG tablet Commonly known as: TYLENOL Take 1-2 tablets (325-650 mg total) by mouth every 4 (four) hours as needed for mild pain. What changed:   when to take this  reasons to take this   aspirin 325 MG tablet Take 325 mg by mouth daily.   carvedilol 25 MG tablet Commonly known as: COREG TAKE 1 TABLET TWICE DAILY   docusate sodium 100 MG capsule Commonly known as: COLACE Take 1 capsule (100 mg total) by mouth 2 (two) times daily. Notes to patient: For constipation   Droplet Pen Needles 31G X 5 MM Misc Generic drug: Insulin Pen Needle USE TO INJECT INSULIN FOUR TIMES DAILY AS INSTRUCTED (FOR DIABETES)   losartan-hydrochlorothiazide 50-12.5 MG tablet Commonly known as: HYZAAR TAKE 1 TABLET EVERY DAY   metFORMIN 1000 MG tablet Commonly known as: GLUCOPHAGE TAKE 1 TABLET EVERY DAY What changed: when to take this   methocarbamol 500 MG tablet Commonly known as: ROBAXIN Take 1 tablet (500 mg total) by mouth every 6 (six) hours as needed for muscle spasms.   multivitamin with minerals Tabs tablet Take 1 tablet by mouth daily. Centrum Silver for Men 50+   NovoLOG FlexPen 100 UNIT/ML FlexPen Generic drug: insulin aspart INJECT 16-26 UNITS SUBCUTANEOUSLY THREE TIMES DAILY WITH MEALS What changed:   how much to  take  how to take this  when to take this  additional instructions Notes to patient: Use your home sliding scale   Oxycodone HCl 10 MG Tabs--Rx# 24 pills Take 0.5-1 tablets (5-10 mg total) by mouth every 8 (eight) hours as needed. What changed:   medication strength  when to take this  reasons to take this   potassium citrate 10 MEQ (1080 MG) SR tablet Commonly known as: UROCIT-K Take 20 mEq by mouth 2 (two) times daily.   PROBIOTIC PO Take 1 capsule by mouth daily. Probulin Probiotic Supplement   senna-docusate 8.6-50 MG tablet Commonly known as: Senokot-S Take 2 tablets by mouth at bedtime.   Toujeo Max SoloStar 300 UNIT/ML Sopn Generic drug: Insulin Glargine (2 Unit Dial) Inject 55 Units into the skin at bedtime. What changed: how much to take   vitamin C 1000  MG tablet Take 1,000 mg by mouth daily.   Vitamin D3 125 MCG (5000 UT) Tabs Take 5,000 Units by mouth daily.   zinc gluconate 50 MG tablet Take 50 mg by mouth 2 (two) times daily.      Follow-up Information    Raulkar, Clide Deutscher, MD Follow up.   Specialty: Physical Medicine and Rehabilitation Why: Office will call you with follow up appointment Contact information: [id number] [place] [address] [phone]        [name], MD. Call on 12/21/2018.   Specialty: Orthopedic Surgery Why: for post op check Contact information: [address] [address] [phone]           Signed: Bary Leriche 12/21/2018, 11:48 PM

## 2018-12-18 NOTE — Plan of Care (Signed)
  Problem: Consults Goal: RH LIMB LOSS PATIENT EDUCATION Description: Description: See Patient Education module for eduction specifics. Outcome: Progressing Goal: Skin Care Protocol Initiated - if Braden Score 18 or less Description: If consults are not indicated, leave blank or document N/A Outcome: Progressing Goal: Diabetes Guidelines if Diabetic/Glucose > 140 Description: If diabetic or lab glucose is > 140 mg/dl - Initiate Diabetes/Hyperglycemia Guidelines & Document Interventions  Outcome: Progressing   Problem: RH BOWEL ELIMINATION Goal: RH STG MANAGE BOWEL WITH ASSISTANCE Description: STG Manage Bowel with min Assistance. Outcome: Progressing Goal: RH STG MANAGE BOWEL W/MEDICATION W/ASSISTANCE Description: STG Manage Bowel with Medication with mod I Assistance. Outcome: Progressing   Problem: RH SKIN INTEGRITY Goal: RH STG SKIN FREE OF INFECTION/BREAKDOWN Description: Patients skin will remain free from further infection or breakdown with min assist. Outcome: Progressing Goal: RH STG MAINTAIN SKIN INTEGRITY WITH ASSISTANCE Description: STG Maintain Skin Integrity With min Assistance. Outcome: Progressing Goal: RH STG ABLE TO PERFORM INCISION/WOUND CARE W/ASSISTANCE Description: STG Able To Perform Incision/Wound Care With mod Assistance from caregiver. Outcome: Progressing   Problem: RH SAFETY Goal: RH STG ADHERE TO SAFETY PRECAUTIONS W/ASSISTANCE/DEVICE Description: STG Adhere to Safety Precautions With mod I Assistance/Device. Outcome: Progressing   Problem: RH PAIN MANAGEMENT Goal: RH STG PAIN MANAGED AT OR BELOW PT'S PAIN GOAL Description: < 4 Outcome: Progressing   Problem: RH KNOWLEDGE DEFICIT LIMB LOSS Goal: RH STG INCREASE KNOWLEDGE OF SELF CARE AFTER LIMB LOSS Description: With min assist. Outcome: Progressing   Problem: Consults Goal: RH LIMB LOSS PATIENT EDUCATION Description: Description: See Patient Education module for eduction specifics. Outcome:  Progressing

## 2018-12-18 NOTE — Progress Notes (Signed)
Occupational Therapy Discharge Summary  Patient Details  Name: [name] MRN: [id number] Date of Birth: 1950-09-13  Today's Date: 12/18/2018 OT Individual Time: 4098-1191 OT Individual Time Calculation (min): 70 min    Pt greeted seated in wc and agreeable to OT treatment session. OT went online and showed pt bariatric tub transfer benches as he will be purchasing his tub bench privately. OT also educated on ROHO cushion and sizing/set-up. Pt will have [facility] who can also help pt set-up ROHO cushion if he chooses to get one. OT discussed pressure relief and pt at higher risk for pressure injuries. Educated on repositioning and pressure relief from wc level with wc push ups and lateral leans. Pt demonstrated understanding. Pt propelled wc to therapy apartment mod I. Showed pt a bariatric tub bench and practiced transfer AP transfer in simulated home environment. Discussed home bathroom set-up and modifications. Discussed kitchen set-up and modifications for wc level cooking which pt states he was doing some at home PTA. Educated on UB strengthening and LB stretches from wc and bed level. Pt reported increased pain without stump shrinker on, so pt propelled wc back to room and OT assisted wth 4 hand technique to don shrinker. Pt reports his significant other has experience with this and can also assist as needed. OT wrote down names of equipment recommendations. Pt reports no further questions and was left sitting in wc with call bell in reach and needs met.    Patient has met 8 of 8 long term goals due to improved activity tolerance, improved balance, postural control and ability to compensate for deficits.  Patient to discharge at overall set-up/ Modified Independent level.  Patient's care partner is independent to provide the necessary physical assistance at discharge.    Reasons goals not met: n/a  Recommendation: Patient will benefit from ongoing skilled OT services in home health  setting to continue to advance functional skills in the area of BADL and iADL.  Equipment: bariatric tub transfer bench and ROHO cushion- pt to purchase both privately  Reasons for discharge: treatment goals met and discharge from hospital  Patient/family agrees with progress made and goals achieved: Yes  OT Discharge Precautions/Restrictions  Precautions Precautions: Fall Restrictions Weight Bearing Restrictions: Yes RLE Weight Bearing: Non weight bearing LLE Weight Bearing: (L AKA) Pain Pain Assessment Pain Scale: 0-10 Pain Score: 6  Pain Type: Surgical pain Pain Location: Leg Pain Orientation: Right Patients Stated Pain Goal: 2 Pain Intervention(s): Shrinker applied and repositioned ADL ADL Eating: Independent Grooming: Independent Upper Body Bathing: Independent Lower Body Bathing: Setup Upper Body Dressing: Independent Lower Body Dressing: Modified independent Toileting: Setup Toilet Transfer: Distant supervision(set-up) Tub/Shower Transfer: Distant supervision(set-up) Cognition Overall Cognitive Status: Within Functional Limits for tasks assessed Arousal/Alertness: Awake/alert Orientation Level: Oriented X4 Awareness: Appears intact Problem Solving: Appears intact Safety/Judgment: Appears intact Sensation Sensation Light Touch: Impaired Detail Light Touch Impaired Details: (b finger tips neuropothy, no change from baseline) Coordination Fine Motor Movements are Fluid and Coordinated: Yes Finger Nose Finger Test: [facility] Motor  Motor Motor - Discharge Observations: [facility] Trunk/Postural Assessment  Cervical Assessment Cervical Assessment: Within Functional Limits Thoracic Assessment Thoracic Assessment: Within Functional Limits  Balance Balance Balance Assessed: Yes Static Sitting Balance Static Sitting - Balance Support: Bilateral upper extremity supported Static Sitting - Level of Assistance: 7: Independent Dynamic Sitting Balance Dynamic Sitting -  Balance Support: During functional activity Dynamic Sitting - Level of Assistance: 6: Modified independent (Device/Increase time) Extremity/Trunk Assessment RUE Assessment RUE Assessment: Within Functional  Limits LUE Assessment LUE Assessment: Within Functional Limits   Valma Cava 12/18/2018, 10:29 AM

## 2018-12-18 NOTE — Discharge Instructions (Signed)
Inpatient Rehab Discharge Instructions  [name] Discharge date and time: 12/19/18    Activities/Precautions/ Functional Status: Activity: no lifting, driving, or strenuous exercise till cleared by MD Diet: diabetic diet Wound Care: keep wound clean and dry.  Contact Dr. [name] if you develop any problems with your incision/wound--redness, swelling, increase in pain, drainage or if you develop fever or chills.    Functional status:  ___ No restrictions     ___ Walk up steps independently ___ 24/7 supervision/assistance   ___ Walk up steps with assistance _X__ Intermittent supervision/assistance  ___ Bathe/dress independently ___ Walk with walker     ___ Bathe/dress with assistance ___ Walk Independently    ___ Shower independently ___ Walk with assistance    _X__ Shower with assistance _X__ No alcohol     ___ Return to work/school ________   Special Instructions: 1. Check blood sugars before meals and at bedtime.   COMMUNITY REFERRALS UPON DISCHARGE:    Home Health:   PT     OT     RN                     Agency:  [facility] Phone: [phone]     My questions have been answered and I understand these instructions. I will adhere to these goals and the provided educational materials after my discharge from the hospital.  Patient/Caregiver Signature _______________________________ Date __________  Clinician Signature _______________________________________ Date __________  Please bring this form and your medication list with you to all your follow-up doctor's appointments.

## 2018-12-18 NOTE — Progress Notes (Signed)
Physical Therapy Discharge Summary  Patient Details  Name: [name] MRN: [id number] Date of Birth: 11/06/1950  Today's Date: 12/18/2018 PT Individual Time: 3474-2595 PT Individual Time Calculation (min): 58 min    Patient has met 6 of 6 long term goals due to improved activity tolerance, improved balance, improved postural control, increased strength and ability to compensate for deficits.  Patient to discharge at a wheelchair level mod I for w/c mobility, mod I for transfers except set up assist for car transfer.   Patient's care partner is independent to provide the necessary physical assistance at discharge.  During pt's CIR stay, this therapist dicussed power chair & manual light weight chair with pt reporting he would only want power chair for outdoor use and declining lightweight manual chair. There is concern regarding pt's current manual w/c not having an appropriate amputee axle that will accommodate his new center of gravity following his new amputation - will be best if pt can f/u with HHPT regarding this. Also may benefit pt to pursue power w/c in the future for use for all functional mobility, not just outdoor use.  Reasons goals not met: n/a  Recommendation:  Patient will benefit from ongoing skilled PT services in home health setting to continue to advance safe functional mobility, address ongoing impairments in transfers, w/c fit, sitting balance, and minimize fall risk.  Equipment: No equipment provided - pt already has manual w/c, please see note re: w/c above  Reasons for discharge: treatment goals met and discharge from hospital  Patient/family agrees with progress made and goals achieved: Yes  Skilled PT Treatment: Pt received in w/c with significant other [name]) present. Pt agreeable to tx, no c/o pain reported. Discussed f/u on power w/c as outpatient, assessing pt's current w/c axle to determine if it's in the appropriate position to accommodate his new COG &  need to f/u with HHPT on this, as well as recommendation to use weights to offset w/c if axle is not in appropriate position until it can be modified. Discussed use of Roho cushion to prevent skin breakdown in the future & pt reports he will purchase one. Pt propels w/c around unit with mod I, CGA over ramp in ortho gym with pt propelling backwards (pt reports he negotiates ramp backwards even prior to admission due to his weight being off set even prior to his amputation) with therapist educating him on need to have [facility] providing supervision when negotiating ramp upon d/c home [name] also made aware). Pt completes car transfer at equal height with slide board and set up assist. Pt voices no concerns regarding d/c home & reports he does not want a hospital bed. Pt utilized BUE ergometer on level 7 x 6 minutes forwards + 4 minutes backwards for BUE strengthening & cardiopulmonary endurance training with rest break needed 2/2 fatigue. Back in room pt transfers w/c<>bed via A/P with mod I without slide board. At end of session pt left in w/c with [name] present to supervise & all needs in reach, NT in room changing sheets & aware of pt's wish to return to bed once she's finished.  PT Discharge Precautions/Restrictions Precautions Precautions: Fall Restrictions Weight Bearing Restrictions: Yes RLE Weight Bearing: Non weight bearing LLE Weight Bearing: (L AKA)  Vision/Perception  Pt wears glasses at all times at baseline. Pt denies changes in baseline vision.   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) Coordination Fine Motor Movements are Fluid and  Coordinated: Yes Finger Nose Finger Test: [facility]   Motor  Motor Motor - Discharge Observations: WFL   Mobility Bed Mobility Bed Mobility: Supine to Sit;Sit to Supine;Rolling Right;Rolling Left(bed mobility per OT report) Rolling Right: Independent(bed mobility per OT report) Rolling Left: Independent(bed mobility per OT report) Supine to Sit: Independent(bed mobility per OT report) Sit to Supine: Independent(bed mobility per OT report) Transfers Transfers: Development worker, community: Independent with assistive device Transfer (Assistive device): (pt intermittently uses slide board for A/P transfer to equal height surfaces)   Locomotion  Gait Ambulation: No Gait Gait: No Stairs / Additional Locomotion Stairs: No Architect: Yes Wheelchair Assistance: Independent with Camera operator: Both upper extremities Distance: 150 ft   Trunk/Postural Assessment  Cervical Assessment Cervical Assessment: Within Functional Limits Thoracic Assessment Thoracic Assessment: Within Functional Limits Lumbar Assessment Lumbar Assessment: (posterior pelvic tilt)\  Balance Balance Balance Assessed: Yes Static Sitting Balance Static Sitting - Balance Support: Bilateral upper extremity supported Static Sitting - Level of Assistance: 7: Independent Dynamic Sitting Balance Dynamic Sitting - Balance Support: During functional activity Dynamic Sitting - Level of Assistance: 6: Modified independent (Device/Increase time)   Extremity Assessment  RUE Assessment RUE Assessment: Within Functional Limits LUE Assessment LUE Assessment: Within Functional Limits BLE AROM WFL, strength not formally tested.   Waunita Schooner 12/18/2018, 3:39 PM

## 2018-12-18 NOTE — Progress Notes (Signed)
Social Work Discharge Note   The overall goal for the admission was met for:   Discharge location: Yes - home with wife who can provide assistance  Length of Stay: Yes - 5 days (with discharge on 12/12)  Discharge activity level: Yes - modified independent overall  Home/community participation: Yes  Services provided included: MD, RD, PT, OT, RN, Pharmacy and [place]: Medicare and Private Insurance: [place]  Follow-up services arranged: Home Health: RN, PT, OT via [facility] and Patient/Family has no preference for HH/DME agencies  Comments (or additional information):   contact info:  Patient @ [phone]  Patient/Family verbalized understanding of follow-up arrangements: Yes  Individual responsible for coordination of the follow-up plan: pt  Confirmed correct DME delivered: Alekxander Isola 12/18/2018    Coda Mathey

## 2018-12-19 LAB — GLUCOSE, CAPILLARY
Glucose-Capillary: 196 mg/dL — ABNORMAL HIGH (ref 70–99)
Glucose-Capillary: 221 mg/dL — ABNORMAL HIGH (ref 70–99)

## 2018-12-19 NOTE — Plan of Care (Signed)
  Problem: Consults Goal: RH LIMB LOSS PATIENT EDUCATION Description: Description: See Patient Education module for eduction specifics. 12/19/2018 1152 by Romana Juniper, LPN Outcome: Completed/Met 12/19/2018 0854 by Romana Juniper, LPN Outcome: Progressing Goal: Skin Care Protocol Initiated - if Braden Score 18 or less Description: If consults are not indicated, leave blank or document N/A 12/19/2018 1152 by Romana Juniper, LPN Outcome: Completed/Met 12/19/2018 0854 by Romana Juniper, LPN Outcome: Progressing Goal: Diabetes Guidelines if Diabetic/Glucose > 140 Description: If diabetic or lab glucose is > 140 mg/dl - Initiate Diabetes/Hyperglycemia Guidelines & Document Interventions  12/19/2018 1152 by Romana Juniper, LPN Outcome: Completed/Met 12/19/2018 0854 by Romana Juniper, LPN Outcome: Progressing   Problem: RH BOWEL ELIMINATION Goal: RH STG MANAGE BOWEL WITH ASSISTANCE Description: STG Manage Bowel with min Assistance. 12/19/2018 1152 by Romana Juniper, LPN Outcome: Completed/Met 12/19/2018 0854 by Romana Juniper, LPN Outcome: Progressing Goal: RH STG MANAGE BOWEL W/MEDICATION W/ASSISTANCE Description: STG Manage Bowel with Medication with mod I Assistance. 12/19/2018 1152 by Romana Juniper, LPN Outcome: Completed/Met 12/19/2018 0854 by Romana Juniper, LPN Outcome: Progressing   Problem: RH SKIN INTEGRITY Goal: RH STG SKIN FREE OF INFECTION/BREAKDOWN Description: Patients skin will remain free from further infection or breakdown with min assist. 12/19/2018 1152 by Romana Juniper, LPN Outcome: Completed/Met 12/19/2018 0854 by Romana Juniper, LPN Outcome: Progressing Goal: RH STG MAINTAIN SKIN INTEGRITY WITH ASSISTANCE Description: STG Maintain Skin Integrity With min Assistance. 12/19/2018 1152 by Romana Juniper, LPN Outcome: Completed/Met 12/19/2018 0854 by Romana Juniper, LPN Outcome: Progressing Goal: RH STG ABLE TO  PERFORM INCISION/WOUND CARE W/ASSISTANCE Description: STG Able To Perform Incision/Wound Care With mod Assistance from caregiver. 12/19/2018 1152 by Romana Juniper, LPN Outcome: Completed/Met 12/19/2018 0854 by Romana Juniper, LPN Outcome: Progressing   Problem: RH SAFETY Goal: RH STG ADHERE TO SAFETY PRECAUTIONS W/ASSISTANCE/DEVICE Description: STG Adhere to Safety Precautions With mod I Assistance/Device. 12/19/2018 1152 by Romana Juniper, LPN Outcome: Completed/Met 12/19/2018 0854 by Romana Juniper, LPN Outcome: Progressing   Problem: RH PAIN MANAGEMENT Goal: RH STG PAIN MANAGED AT OR BELOW PT'S PAIN GOAL Description: < 4 12/19/2018 1152 by Romana Juniper, LPN Outcome: Completed/Met 12/19/2018 0854 by Romana Juniper, LPN Outcome: Progressing   Problem: RH KNOWLEDGE DEFICIT LIMB LOSS Goal: RH STG INCREASE KNOWLEDGE OF SELF CARE AFTER LIMB LOSS Description: With min assist. 12/19/2018 1152 by Romana Juniper, LPN Outcome: Completed/Met 12/19/2018 0854 by Romana Juniper, LPN Outcome: Progressing   Problem: Consults Goal: RH LIMB LOSS PATIENT EDUCATION Description: Description: See Patient Education module for eduction specifics. 12/19/2018 1152 by Romana Juniper, LPN Outcome: Completed/Met 12/19/2018 0854 by Romana Juniper, LPN Outcome: Progressing

## 2018-12-19 NOTE — Progress Notes (Signed)
[facility] PHYSICAL MEDICINE & REHABILITATION PROGRESS NOTE   Subjective/Complaints: No new complaints. Denies any issues after vac came off. Dressing still in place  ROS: Patient denies fever, rash, sore throat, blurred vision, nausea, vomiting, diarrhea, cough, shortness of breath or chest pain, joint or back pain, headache, or mood change.    Objective:   No results found. No results for input(s): WBC, HGB, HCT, PLT in the last 72 hours. No results for input(s): NA, K, CL, CO2, GLUCOSE, BUN, CREATININE, CALCIUM in the last 72 hours.  Intake/Output Summary (Last 24 hours) at 12/19/2018 0907 Last data filed at 12/19/2018 0900 Gross per 24 hour  Intake 960 ml  Output 1050 ml  Net -90 ml     Physical Exam: Vital Signs Blood pressure 116/86, pulse 77, temperature 98.5 F (36.9 C), temperature source Oral, resp. rate 18, weight 119.6 kg, SpO2 96 %. Constitutional: No distress . Vital signs reviewed. HEENT: EOMI, oral membranes moist Neck: supple Cardiovascular: RRR without murmur. No JVD    Respiratory: CTA Bilaterally without wheezes or rales. Normal effort    GI: BS +, non-tender, non-distended   Musculoskeletal:  Comments: Right AK with shrinker--edema less Neurological: He is alertand oriented to person, place, and time. Nocranial nerve deficit. UE 5/5 prox to distal. Able to lift right off bed and move in all planes. LLE 4/5. No sensory deficits seen Skin:No rashnoted. He is not diaphoretic. Noerythema. RLE wound with staples, scatter s-s drainage is moderate from a few different sites. Wound is well approximated.    Assessment/Plan: 1. Functional deficits secondary to right AKA which require 3+ hours per day of interdisciplinary therapy in a comprehensive inpatient rehab setting.  Physiatrist is providing close team supervision and 24 hour management of active medical problems listed below.  Physiatrist and rehab team continue to assess barriers to  discharge/monitor patient progress toward functional and medical goals  Care Tool:  Bathing    Body parts bathed by patient: Right arm, Left arm, Chest, Abdomen, Front perineal area, Right upper leg, Left upper leg, Face, Buttocks   Body parts bathed by helper: Buttocks Body parts n/a: Left lower leg, Right lower leg   Bathing assist Assist Level: Set up assist     Upper Body Dressing/Undressing Upper body dressing   What is the patient wearing?: Pull over shirt    Upper body assist Assist Level: Independent    Lower Body Dressing/Undressing Lower body dressing      What is the patient wearing?: Pants     Lower body assist Assist for lower body dressing: Independent with assitive device     Toileting Toileting    Toileting assist Assist for toileting: Independent with assistive device Assistive Device Comment: urinal   Transfers Chair/bed transfer  Transfers assist     Chair/bed transfer assist level: Independent with assistive device     Locomotion Ambulation   Ambulation assist   Ambulation activity did not occur: N/A          Walk 10 feet activity   Assist  Walk 10 feet activity did not occur: N/A        Walk 50 feet activity   Assist Walk 50 feet with 2 turns activity did not occur: N/A         Walk 150 feet activity   Assist Walk 150 feet activity did not occur: N/A         Walk 10 feet on uneven surface  activity   Assist Walk 10  feet on uneven surfaces activity did not occur: N/A         Wheelchair     Assist Will patient use wheelchair at discharge?: Yes Type of Wheelchair: Manual    Wheelchair assist level: Independent Max wheelchair distance: >150 ft    Wheelchair 50 feet with 2 turns activity    Assist        Assist Level: Independent   Wheelchair 150 feet activity     Assist  Wheelchair 150 feet activity did not occur: Safety/medical concerns   Assist Level: Independent   Blood  pressure 116/86, pulse 77, temperature 98.5 F (36.9 C), temperature source Oral, resp. rate 18, weight 119.6 kg, SpO2 96 %.  Medical Problem List and Plan: 1.Functional and mobility deficitssecondary to right AKA (hx of left AKA 12/19 )   -dc home today, ortho follow up  -Patient to see Dr. Ranell Patrick in the office for transitional care encounter in 1-2 weeks.    -will discuss w/c rx at that time 2. Antithrombotics: -DVT/anticoagulation:Pharmaceutical:Lovenox -antiplatelet therapy: ASA 3. Pain Management:oxycodone or tylenol PRN. 4. Mood:LCSW to follow for evaluation and support. -antipsychotic agents: N/A 5. Neuropsych: This patientiscapable of making decisions on hisown behalf. 6. Skin/Wound Care:abd pad, kerlix, ace, shrinker, daily to bid depending upon drainage   -hh rn should follow up 7. Fluids/Electrolytes/Nutrition:eating well 8. T2DM: Monitor BS ac/hs. On metformin 1000 mg bid withlantus40 units at bedtime. -poorly controlled.   - on toujeo 55 u at bedtime at home. Resume this dose at home 9.HTN: Monitor BP tid--continue Cozaar/HCTZ Daily.  -reasonable control 12/11 10. [id number] to use CPAP  11. H/o CAD: On ASA and coreg. 12. Mild constipation: results with miralax and senna-s  - senna-s as a regular part of regimen   LOS: 5 days A FACE TO FACE EVALUATION WAS PERFORMED  Meredith Staggers 12/19/2018, 9:07 AM

## 2018-12-19 NOTE — Progress Notes (Signed)
Patient discharged off of unit with all belongings. Patient understands that if symptoms recur or if the patient has any symptoms similar to those that were present at this admission, the patient needs to call their PCP. Patient and family have no further questions at time of discharge. No complications noted at this time.  [name]  

## 2018-12-19 NOTE — Plan of Care (Signed)
  Problem: Consults Goal: RH LIMB LOSS PATIENT EDUCATION Description: Description: See Patient Education module for eduction specifics. Outcome: Progressing Goal: Skin Care Protocol Initiated - if Braden Score 18 or less Description: If consults are not indicated, leave blank or document N/A Outcome: Progressing Goal: Diabetes Guidelines if Diabetic/Glucose > 140 Description: If diabetic or lab glucose is > 140 mg/dl - Initiate Diabetes/Hyperglycemia Guidelines & Document Interventions  Outcome: Progressing   Problem: RH BOWEL ELIMINATION Goal: RH STG MANAGE BOWEL WITH ASSISTANCE Description: STG Manage Bowel with min Assistance. Outcome: Progressing Goal: RH STG MANAGE BOWEL W/MEDICATION W/ASSISTANCE Description: STG Manage Bowel with Medication with mod I Assistance. Outcome: Progressing   Problem: RH SKIN INTEGRITY Goal: RH STG SKIN FREE OF INFECTION/BREAKDOWN Description: Patients skin will remain free from further infection or breakdown with min assist. Outcome: Progressing Goal: RH STG MAINTAIN SKIN INTEGRITY WITH ASSISTANCE Description: STG Maintain Skin Integrity With min Assistance. Outcome: Progressing Goal: RH STG ABLE TO PERFORM INCISION/WOUND CARE W/ASSISTANCE Description: STG Able To Perform Incision/Wound Care With mod Assistance from caregiver. Outcome: Progressing   Problem: RH SAFETY Goal: RH STG ADHERE TO SAFETY PRECAUTIONS W/ASSISTANCE/DEVICE Description: STG Adhere to Safety Precautions With mod I Assistance/Device. Outcome: Progressing   Problem: RH PAIN MANAGEMENT Goal: RH STG PAIN MANAGED AT OR BELOW PT'S PAIN GOAL Description: < 4 Outcome: Progressing   Problem: RH KNOWLEDGE DEFICIT LIMB LOSS Goal: RH STG INCREASE KNOWLEDGE OF SELF CARE AFTER LIMB LOSS Description: With min assist. Outcome: Progressing   Problem: Consults Goal: RH LIMB LOSS PATIENT EDUCATION Description: Description: See Patient Education module for eduction specifics. Outcome:  Progressing

## 2018-12-22 ENCOUNTER — Telehealth: Payer: Self-pay

## 2018-12-22 NOTE — Telephone Encounter (Signed)
Transitional Care call--patient    1. Are you/is patient experiencing any problems since coming home? No Are there any questions regarding any aspect of care? No 2. Are there any questions regarding medications administration/dosing? No Are meds being taken as prescribed? Yes Patient should review meds with caller to confirm 3. Have there been any falls? No 4. Has Home Health been to the house and/or have they contacted you? Yes If not, have you tried to contact them?  Can we help you contact them? 5. Are bowels and bladder emptying properly? Yes Are there any unexpected incontinence issues? No If applicable, is patient following bowel/bladder programs? 6. Any fevers, problems with breathing, unexpected pain? No 7. Are there any skin problems or new areas of breakdown? No 8. Has the patient/family member arranged specialty MD follow up (ie cardiology/neurology/renal/surgical/etc)? Yes  Can we help arrange? 9. Does the patient need any other services or support that we can help arrange? No 10. Are caregivers following through as expected in assisting the patient? Yes 11. Has the patient quit smoking, drinking alcohol, or using drugs as recommended? Yes  Appointment time 1:40 pm, arrive time 1:20 pm with Dr. Ranell Patrick [phone]

## 2018-12-23 ENCOUNTER — Other Ambulatory Visit: Payer: Self-pay | Admitting: Internal Medicine

## 2018-12-24 ENCOUNTER — Telehealth: Payer: Self-pay

## 2018-12-24 NOTE — Telephone Encounter (Signed)
Transition Care Management Follow-up Telephone Call  Date of discharge and from where[facility]  How have you been since you were released from the hospital? Patient states that he is feeling great.  Any questions or concerns? No   Items Reviewed:  Did the pt receive and understand the discharge instructions provided? Yes   Medications obtained and verified? Yes   Any new allergies since your discharge? No  Dietary orders reviewed? Yes  Do you have support at home? Yes   Functional Questionnaire: (I = Independent and D = Dependent) ADLs: I  Bathing/Dressing- I  Meal Prep- I  Eating- I  Maintaining continence- I  Transferring/Ambulation- I  Managing Meds- I  Follow up appointments reviewed:   PCP Hospital f/u appt confirmed? Patient states that he is following up with his surgeon and physical therapy currently. He will just keep his physical appointment with Dr. [name] for next year as scheduled.   [facility] f/u appt confirmed? Yes  scheduled to see his surgeon tomorrow.  Are transportation arrangements needed? No   If their condition worsens, is the pt aware to call PCP or go to the Emergency Dept.? Yes  Was the patient provided with contact information for the PCP's office or ED? Yes  Was to pt encouraged to call back with questions or concerns? Yes

## 2018-12-25 ENCOUNTER — Encounter: Payer: Self-pay | Admitting: Physician Assistant

## 2018-12-25 ENCOUNTER — Other Ambulatory Visit: Payer: Self-pay

## 2018-12-25 ENCOUNTER — Ambulatory Visit (INDEPENDENT_AMBULATORY_CARE_PROVIDER_SITE_OTHER): Payer: Medicare Other | Admitting: Physician Assistant

## 2018-12-25 VITALS — Ht 74.0 in | Wt 263.0 lb

## 2018-12-25 DIAGNOSIS — I87331 Chronic venous hypertension (idiopathic) with ulcer and inflammation of right lower extremity: Secondary | ICD-10-CM

## 2018-12-25 DIAGNOSIS — I70261 Atherosclerosis of native arteries of extremities with gangrene, right leg: Secondary | ICD-10-CM

## 2018-12-25 DIAGNOSIS — L97919 Non-pressure chronic ulcer of unspecified part of right lower leg with unspecified severity: Secondary | ICD-10-CM

## 2018-12-25 NOTE — Progress Notes (Signed)
Office Visit Note   Patient: [name]           Date of Birth: 13-Aug-1950           MRN: [id number] Visit Date: 12/25/2018              Requested by: [name], MD [place],  [place] [id number] PCP: [name], MD  Chief Complaint  Patient presents with  . Right Leg - Routine Post Op    12/11/18 right AKA      HPI: This is a pleasant gentleman who is 2 weeks status post right above-knee amputation.  Overall he is doing well and has no complaints  Assessment & Plan: Visit Diagnoses: No diagnosis found.  Plan: He will follow up in 2 weeks time he is using and will continue to use his shrinker new dressing was applied  Follow-Up Instructions: No follow-ups on file.   Ortho Exam  Patient is alert, oriented, no adenopathy, well-dressed, normal affect, normal respiratory effort. Focused examination of his right amputation stump demonstrates healing surgical incisions very small amount of serosanguineous drainage from central portion but no wound dehiscence and no necrotic tissue noted healthy skin edges well approximated no cellulitis  Imaging: No results found. No images are attached to the encounter.  Labs: Lab Results  Component Value Date   HGBA1C 8.6 (H) 12/11/2018   HGBA1C 6.4 (A) 12/12/2017   HGBA1C 7.3 (A) 08/05/2017   ESRSEDRATE 35 (H) 03/31/2013   CRP 5.3 (H) 03/31/2013   REPTSTATUS 12/25/2017 FINAL 12/20/2017   GRAMSTAIN  12/30/2012    FEW WBC PRESENT, PREDOMINANTLY PMN RARE SQUAMOUS EPITHELIAL CELLS PRESENT ABUNDANT GRAM NEGATIVE RODS MODERATE GRAM POSITIVE COCCI IN PAIRS IN CLUSTERS   CULT  12/20/2017    NO GROWTH 5 DAYS Performed at [facility], [place] [address] [place], [place] [address]    LABORGA KLEBSIELLA OXYTOCA 12/15/2012   LABORGA PSEUDOMONAS AERUGINOSA 12/15/2012     Lab Results  Component Value Date   ALBUMIN 2.9 (L) 12/15/2018   ALBUMIN 2.6 (L) 12/27/2017   ALBUMIN 2.7 (L) 12/26/2017     No results found for: MG No results found for: VD25OH  No results found for: PREALBUMIN CBC EXTENDED Latest Ref Rng & Units 12/15/2018 12/13/2018 12/12/2018  WBC 4.0 - 10.5 K/uL 10.1 10.5 15.7(H)  RBC 4.22 - 5.81 MIL/uL 4.07(L) 4.09(L) 4.22  HGB 13.0 - 17.0 g/dL 11.2(L) 11.1(L) 11.5(L)  HCT 39.0 - 52.0 % 33.9(L) 34.0(L) 35.5(L)  PLT 150 - 400 K/uL 282 257 295  NEUTROABS 1.7 - 7.7 K/uL 7.5 - -  LYMPHSABS 0.7 - 4.0 K/uL 1.2 - -     Body mass index is 33.77 kg/m.  Orders:  No orders of the defined types were placed in this encounter.  No orders of the defined types were placed in this encounter.    Procedures: No procedures performed  Clinical Data: No additional findings.  ROS:  All other systems negative, except as noted in the HPI. Review of Systems  Objective: Vital Signs: Ht 6\' 2"  (1.88 m)   Wt 263 lb (119.3 kg)   BMI 33.77 kg/m   Specialty Comments:  No specialty comments available.  PMFS History: Patient Active Problem List   Diagnosis Date Noted  . Above knee amputation of right lower extremity ([place]) 12/14/2018  . Atherosclerosis of native artery of right leg with gangrene ([place]) 12/11/2018  . Hypoalbuminemia due to protein-calorie malnutrition ([place])   .  Type 2 diabetes mellitus with peripheral neuropathy (HCC)   . Left above-knee amputee ([place]) 12/26/2017  . Unilateral AKA, left ([place])   . History of complete ray amputation of fifth toe of right foot ([place])   . Postoperative pain   . Acute blood loss anemia   . Essential hypertension   . Atherosclerosis of native arteries of extremities with gangrene, right leg ([place]) 02/19/2017  . Chronic renal disease, stage III 02/19/2017  . Ulcer of toe of right foot, limited to breakdown of skin ([place]) 10/11/2016  . Type 2 diabetes mellitus with diabetic polyneuropathy, with long-term current use of insulin ([place]) 03/29/2015  . Advance directive discussed with patient 02/07/2014  . Leucocytosis 04/01/2013  .  Normocytic anemia 04/01/2013  . Diabetic Charcot foot ([place]) 04/01/2013  . Routine general medical examination at a health care facility 12/25/2010  . PERSONAL HX COLONIC POLYPS 11/22/2009  . Venous (peripheral) insufficiency 06/29/2008  . Osteoarthritis, multiple sites 04/03/2007  . ERECTILE DYSFUNCTION, ORGANIC 06/13/2006  . Hyperlipemia 06/11/2006  . GLAUCOMA 06/11/2006  . Coronary atherosclerosis of native coronary artery 06/11/2006  . Obstructive sleep apnea 06/11/2006   Past Medical History:  Diagnosis Date  . Cellulitis and abscess of left lower extremity 12/20/2017  . Coronary atherosclerosis of unspecified type of vessel, native or graft   . Diabetes mellitus without complication ([place])    type 2  . Hemorrhage of rectum and anus   . History of kidney stones    passed stones  . Impotence of organic origin   . Internal hemorrhoids without mention of complication   . Left below-knee amputee ([place]) 02/23/2016  . Malignant neoplasm of prostate ([place])   . Mononeuritis of unspecified site   . Osteoarthrosis, unspecified whether generalized or localized, unspecified site   . Other and unspecified hyperlipidemia   . Peripheral vascular disease ([place])   . Personal history of colonic polyps   . Personal history of diabetic foot ulcer    saw wound center, resolved 05/08/2010  . Personal history of gallstones   . Routine general medical examination at a health care facility   . Subacute osteomyelitis, right ankle and foot ([place])   . Type II or unspecified type diabetes mellitus with neurological manifestations, not stated as uncontrolled(250.60)   . Unspecified glaucoma(365.9)   . Unspecified sleep apnea    cpcp  . Unspecified venous (peripheral) insufficiency     Family History  Problem Relation Age of Onset  . Lung cancer Father   . Multiple sclerosis Mother   . Heart attack Other        paternal aunts and uncles  . Peripheral vascular disease Maternal Grandfather        several  amputations    Past Surgical History:  Procedure Laterality Date  . AMPUTATION Left 12/30/2012   Procedure: AMPUTATION RAY ;  Surgeon: Meredith Pel, MD;  Location: [facility];  Service: Orthopedics;  Laterality: Left;  LEFT GREAT TOE RAY AMPUTATION  . AMPUTATION Left 02/23/2016   Procedure: Left Below Knee Amputation;  Surgeon: [name], MD;  Location: [facility];  Service: Orthopedics;  Laterality: Left;  . AMPUTATION Left 12/24/2017   Procedure: LEFT ABOVE KNEE AMPUTATION;  Surgeon: [name], MD;  Location: [facility];  Service: Orthopedics;  Laterality: Left;  . AMPUTATION Right 12/24/2017   Procedure: RIGHT 5TH RAY AMPUTATION;  Surgeon: [name], MD;  Location: [facility];  Service: Orthopedics;  Laterality: Right;  . AMPUTATION Right 12/11/2018  Procedure: RIGHT ABOVE KNEE AMPUTATION;  Surgeon: [name], MD;  Location: [facility];  Service: Orthopedics;  Laterality: Right;  . APPENDECTOMY    . BASAL CELL CARCINOMA EXCISION  2/16   left forearm  . BELPHAROPTOSIS REPAIR    . CARDIAC CATHETERIZATION  1998   Negative  . CATARACT EXTRACTION Right 2017   then lid surgery  . COLONOSCOPY    . CORONARY ARTERY BYPASS GRAFT  09/2005   5 bypasses per patient, Post op AFIB  . EYE SURGERY    . FOOT BONE EXCISION Left 11/03/2013   DR DUDA   . I & D EXTREMITY Left 11/03/2013   Procedure: Left Foot Partial Bone Excision Cuboid and Medial Cuneiform, Wound Closures;  Surgeon: [name], MD;  Location: [facility];  Service: Orthopedics;  Laterality: Left;  . INSERTION PROSTATE RADIATION SEED  2009   RT and seeds for prostate cancer  . KIDNEY STONE SURGERY  04/1993  . RCA stents  04/2003   EF 55%  . RETINAL DETACHMENT SURGERY  2002-2003  . thrombosed vein  1993   Right leg   Social History   Occupational History  . Occupation: Heavy equipment mechanic--retired  Tobacco Use  . Smoking status: Former Smoker    Packs/day: 2.00    Years: 35.00    Pack years: 70.00    Types: Cigars,  Cigarettes    Quit date: 01/07/2001    Years since quitting: 17.9  . Smokeless tobacco: Never Used  Substance and Sexual Activity  . Alcohol use: Not Currently    Comment: rare  . Drug use: No  . Sexual activity: Yes    Partners: Female

## 2018-12-28 ENCOUNTER — Telehealth: Payer: Self-pay

## 2018-12-28 NOTE — Telephone Encounter (Signed)
Amy with [facility] called stating that patient needs an antibiotic called into the pharmacy today due to patient having some warmth, drainage, and redness around his right AKA.  Pharmacy is [facility] in [place], [place].  Cb# for Amy is [phone].  Please advise.  Thank you.

## 2018-12-28 NOTE — Telephone Encounter (Signed)
I tried to call patient and get him scheduled to come in for tomorrow morning but patient did not answer. LVM for callback and get this scheduled.

## 2018-12-28 NOTE — Telephone Encounter (Signed)
I think we should probably take a peek at it. Tomorrow morning would be better than the afternoon

## 2018-12-28 NOTE — Telephone Encounter (Signed)
Can you please advise? Patient was in our office on 12/25/2018. Would you like for him to start an antibiotic or make an appt to come into the office for tomorrow? Thanks

## 2018-12-29 ENCOUNTER — Ambulatory Visit (INDEPENDENT_AMBULATORY_CARE_PROVIDER_SITE_OTHER): Payer: Medicare Other | Admitting: Physician Assistant

## 2018-12-29 ENCOUNTER — Other Ambulatory Visit: Payer: Self-pay

## 2018-12-29 ENCOUNTER — Encounter: Payer: Self-pay | Admitting: Physician Assistant

## 2018-12-29 VITALS — Ht 74.0 in | Wt 263.0 lb

## 2018-12-29 DIAGNOSIS — S78112A Complete traumatic amputation at level between left hip and knee, initial encounter: Secondary | ICD-10-CM

## 2018-12-29 DIAGNOSIS — Z89612 Acquired absence of left leg above knee: Secondary | ICD-10-CM

## 2018-12-29 MED ORDER — DOXYCYCLINE HYCLATE 100 MG PO TABS: 100.0000 mg | ORAL_TABLET | Freq: Two times a day (BID) | ORAL | 0 refills | Status: DC

## 2018-12-29 NOTE — Progress Notes (Addendum)
Office Visit Note   Patient: [name]           Date of Birth: 06/30/1950           MRN: [id number] Visit Date: 12/29/2018              Requested by: [name], MD [address] [place],  [place] [address] PCP: [name], MD  Chief Complaint  Patient presents with  . Right Leg - Routine Post Op    12/11/18 right AKA       HPI: This is a pleasant gentleman who is 3 weeks status post above-knee amputation he comes in today because his home health nurse was concerned that he had some increasing redness and a little bit more drainage and a little bit more pain around his amputation stump he denies any fever and is not febrile today however he did say he had the night sweats and his bed was soaked when he woke up this morning. His blood sugars have remained stable   Assessment & Plan: Visit Diagnoses: No diagnosis found.  Plan: I discussed the patient with Dr. [name] we will place him on a course of antibiotics he will follow-up with me early next week he understands with the holidays that if he develops increased symptoms increased drainage or increased pain he would have to go to the emergency room  Follow-Up Instructions: No follow-ups on file.   Ortho Exam  Patient is alert, oriented, no adenopathy, well-dressed, normal affect, normal respiratory effort. Focused examination of his amputation stump demonstrates one small area of dehiscence with serous drainage.  Mild surrounding erythema and tenderness there is no purulent drainage no fluctuation I cannot appreciate any foul odor  Imaging: No results found. No images are attached to the encounter.  Labs: Lab Results  Component Value Date   HGBA1C 8.6 (H) 12/11/2018   HGBA1C 6.4 (A) 12/12/2017   HGBA1C 7.3 (A) 08/05/2017   ESRSEDRATE 35 (H) 03/31/2013   CRP 5.3 (H) 03/31/2013   REPTSTATUS 12/25/2017 FINAL 12/20/2017   GRAMSTAIN  12/30/2012    FEW WBC PRESENT, PREDOMINANTLY PMN RARE SQUAMOUS  EPITHELIAL CELLS PRESENT ABUNDANT GRAM NEGATIVE RODS MODERATE GRAM POSITIVE COCCI IN PAIRS IN CLUSTERS   CULT  12/20/2017    NO GROWTH 5 DAYS Performed at [facility], [place] [address], [place], [place] [address]    LABORGA KLEBSIELLA OXYTOCA 12/15/2012   LABORGA PSEUDOMONAS AERUGINOSA 12/15/2012     Lab Results  Component Value Date   ALBUMIN 2.9 (L) 12/15/2018   ALBUMIN 2.6 (L) 12/27/2017   ALBUMIN 2.7 (L) 12/26/2017    No results found for: MG No results found for: VD25OH  No results found for: PREALBUMIN CBC EXTENDED Latest Ref Rng & Units 12/15/2018 12/13/2018 12/12/2018  WBC 4.0 - 10.5 K/uL 10.1 10.5 15.7(H)  RBC 4.22 - 5.81 MIL/uL 4.07(L) 4.09(L) 4.22  HGB 13.0 - 17.0 g/dL 11.2(L) 11.1(L) 11.5(L)  HCT 39.0 - 52.0 % 33.9(L) 34.0(L) 35.5(L)  PLT 150 - 400 K/uL 282 257 295  NEUTROABS 1.7 - 7.7 K/uL 7.5 - -  LYMPHSABS 0.7 - 4.0 K/uL 1.2 - -     Body mass index is 33.77 kg/m.  Orders:  No orders of the defined types were placed in this encounter.  Meds ordered this encounter  Medications  . doxycycline (VIBRA-TABS) 100 MG tablet    Sig: Take 1 tablet (100 mg total) by mouth 2 (two) times daily.  Dispense:  60 tablet    Refill:  0     Procedures: No procedures performed  Clinical Data: No additional findings.  ROS:  All other systems negative, except as noted in the HPI. Review of Systems  Objective: Vital Signs: Ht 6\' 2"  (1.88 m)   Wt 263 lb (119.3 kg)   BMI 33.77 kg/m   Specialty Comments:  No specialty comments available.  PMFS History: Patient Active Problem List   Diagnosis Date Noted  . Above knee amputation of right lower extremity ([place]) 12/14/2018  . Atherosclerosis of native artery of right leg with gangrene ([place]) 12/11/2018  . Hypoalbuminemia due to protein-calorie malnutrition ([place])   . Type 2 diabetes mellitus with peripheral neuropathy (H[facility])   . Left above-knee amputee ([place]) 12/26/2017  . Unilateral AKA, left ([place])   .  History of complete ray amputation of fifth toe of right foot ([place])   . Postoperative pain   . Acute blood loss anemia   . Essential hypertension   . Atherosclerosis of native arteries of extremities with gangrene, right leg ([place]) 02/19/2017  . Chronic renal disease, stage III 02/19/2017  . Ulcer of toe of right foot, limited to breakdown of skin ([place]) 10/11/2016  . Type 2 diabetes mellitus with diabetic polyneuropathy, with long-term current use of insulin ([place]) 03/29/2015  . Advance directive discussed with patient 02/07/2014  . Leucocytosis 04/01/2013  . Normocytic anemia 04/01/2013  . Diabetic Charcot foot ([place]) 04/01/2013  . Routine general medical examination at a health care facility 12/25/2010  . PERSONAL HX COLONIC POLYPS 11/22/2009  . Venous (peripheral) insufficiency 06/29/2008  . Osteoarthritis, multiple sites 04/03/2007  . ERECTILE DYSFUNCTION, ORGANIC 06/13/2006  . Hyperlipemia 06/11/2006  . GLAUCOMA 06/11/2006  . Coronary atherosclerosis of native coronary artery 06/11/2006  . Obstructive sleep apnea 06/11/2006   Past Medical History:  Diagnosis Date  . Cellulitis and abscess of left lower extremity 12/20/2017  . Coronary atherosclerosis of unspecified type of vessel, native or graft   . Diabetes mellitus without complication ([place])    type 2  . Hemorrhage of rectum and anus   . History of kidney stones    passed stones  . Impotence of organic origin   . Internal hemorrhoids without mention of complication   . Left below-knee amputee ([place]) 02/23/2016  . Malignant neoplasm of prostate ([place])   . Mononeuritis of unspecified site   . Osteoarthrosis, unspecified whether generalized or localized, unspecified site   . Other and unspecified hyperlipidemia   . Peripheral vascular disease ([place])   . Personal history of colonic polyps   . Personal history of diabetic foot ulcer    saw wound center, resolved 05/08/2010  . Personal history of gallstones   . Routine general  medical examination at a health care facility   . Subacute osteomyelitis, right ankle and foot ([place])   . Type II or unspecified type diabetes mellitus with neurological manifestations, not stated as uncontrolled(250.60)   . Unspecified glaucoma(365.9)   . Unspecified sleep apnea    cpcp  . Unspecified venous (peripheral) insufficiency     Family History  Problem Relation Age of Onset  . Lung cancer Father   . Multiple sclerosis Mother   . Heart attack Other        paternal aunts and uncles  . Peripheral vascular disease Maternal Grandfather        several amputations    Past Surgical History:  Procedure Laterality Date  . AMPUTATION Left 12/30/2012  Procedure: AMPUTATION RAY ;  Surgeon: Meredith Pel, MD;  Location[facility];  Service: Orthopedics;  Laterality: Left;  LEFT GREAT TOE RAY AMPUTATION  . AMPUTATION Left 02/23/2016   Procedure: Left Below Knee Amputation;  Surgeon: [name], MD;  Location: [facility];  Service: Orthopedics;  Laterality: Left;  . AMPUTATION Left 12/24/2017   Procedure: LEFT ABOVE KNEE AMPUTATION;  Surgeon: [name], MD;  Location: [facility];  Service: Orthopedics;  Laterality: Left;  . AMPUTATION Right 12/24/2017   Procedure: RIGHT 5TH RAY AMPUTATION;  Surgeon: [name], MD;  Location: [facility];  Service: Orthopedics;  Laterality: Right;  . AMPUTATION Right 12/11/2018   Procedure: RIGHT ABOVE KNEE AMPUTATION;  Surgeon: [name], MD;  Location: [facility];  Service: Orthopedics;  Laterality: Right;  . APPENDECTOMY    . BASAL CELL CARCINOMA EXCISION  2/16   left forearm  . BELPHAROPTOSIS REPAIR    . CARDIAC CATHETERIZATION  1998   Negative  . CATARACT EXTRACTION Right 2017   then lid surgery  . COLONOSCOPY    . CORONARY ARTERY BYPASS GRAFT  09/2005   5 bypasses per patient, Post op AFIB  . EYE SURGERY    . FOOT BONE EXCISION Left 11/03/2013   DR DUDA   . I & D EXTREMITY Left 11/03/2013   Procedure: Left Foot Partial Bone Excision Cuboid and  Medial Cuneiform, Wound Closures;  Surgeon: [name], MD;  Location: [facility];  Service: Orthopedics;  Laterality: Left;  . INSERTION PROSTATE RADIATION SEED  2009   RT and seeds for prostate cancer  . KIDNEY STONE SURGERY  04/1993  . RCA stents  04/2003   EF 55%  . RETINAL DETACHMENT SURGERY  2002-2003  . thrombosed vein  1993   Right leg   Social History   Occupational History  . Occupation: Heavy equipment mechanic--retired  Tobacco Use  . Smoking status: Former Smoker    Packs/day: 2.00    Years: 35.00    Pack years: 70.00    Types: Cigars, Cigarettes    Quit date: 01/07/2001    Years since quitting: 17.9  . Smokeless tobacco: Never Used  Substance and Sexual Activity  . Alcohol use: Not Currently    Comment: rare  . Drug use: No  . Sexual activity: Yes    Partners: Female

## 2018-12-30 ENCOUNTER — Encounter
Payer: Medicare Other | Attending: Physical Medicine and Rehabilitation | Admitting: Physical Medicine and Rehabilitation

## 2018-12-30 ENCOUNTER — Encounter: Payer: Self-pay | Admitting: Physical Medicine and Rehabilitation

## 2018-12-30 VITALS — BP 126/71 | HR 94 | Temp 97.8°F | Ht 74.0 in | Wt 260.0 lb

## 2018-12-30 DIAGNOSIS — S78111A Complete traumatic amputation at level between right hip and knee, initial encounter: Secondary | ICD-10-CM | POA: Diagnosis not present

## 2018-12-30 NOTE — Progress Notes (Addendum)
Subjective:    Patient ID: [name], male    DOB: May 12, 1950, 68 y.o.   MRN: [id number]  HPI  [name] presents for transitional care follow-up after CIR admission for unilateral right AKA (has history of left AKA).   He has been doing well at home. Has been discharged by PT and OT and has been cooking himself. His fiance has been incredibly helpful and helps with transfers.  He saw Dr. [name] yesterday for residual limb infection and was prescribed antibiotics.   He requested a power wheelchair for increased mobility. Has no plan for prosthetics.  Denies constipation. Having regular BM.  Occasionally takes Oxycodone for severe pain, sometimes at night to help him fall asleep. Does not require every day.   Does not require refills for any of his medications.   Pain Inventory Average Pain 7 Pain Right Now 5 My pain is sharp and aching  In the last 24 hours, has pain interfered with the following? General activity 1 Relation with others 0 Enjoyment of life 1 What TIME of day is your pain at its worst? daytime Sleep (in general) Good  Pain is worse with: some activites Pain improves with: therapy/exercise Relief from Meds: 8  Mobility ability to climb steps?  no do you drive?  no  Function retired  Neuro/Psych spasms  Prior Studies Any changes since last visit?  no  Physicians involved in your care Any changes since last visit?  no   Family History  Problem Relation Age of Onset  . Lung cancer Father   . Multiple sclerosis Mother   . Heart attack Other        paternal aunts and uncles  . Peripheral vascular disease Maternal Grandfather        several amputations   Social History   Socioeconomic History  . Marital status: Widowed    Spouse name: Not on file  . Number of children: 3  . Years of education: Not on file  . Highest education level: Not on file  Occupational History  . Occupation: Heavy equipment mechanic--retired  Tobacco Use  .  Smoking status: Former Smoker    Packs/day: 2.00    Years: 35.00    Pack years: 70.00    Types: Cigars, Cigarettes    Quit date: 01/07/2001    Years since quitting: 17.9  . Smokeless tobacco: Never Used  Substance and Sexual Activity  . Alcohol use: Not Currently    Comment: rare  . Drug use: No  . Sexual activity: Yes    Partners: Female  Other Topics Concern  . Not on file  Social History Narrative   Has living will   Daughter [name] is [place] health care POA    Would accept resuscitation attempts--but no prolonged artificial ventilation   No tube feeds if cognitively unaware   Social Determinants of Health   Financial Resource Strain:   . Difficulty of Paying Living Expenses: Not on file  Food Insecurity:   . Worried About Charity fundraiser in the Last Year: Not on file  . Ran Out of Food in the Last Year: Not on file  Transportation Needs:   . Lack of Transportation (Medical): Not on file  . Lack of Transportation (Non-Medical): Not on file  Physical Activity:   . Days of Exercise per Week: Not on file  . Minutes of Exercise per Session: Not on file  Stress:   . Feeling of Stress : Not on file  Social Connections:   . Frequency of Communication with Friends and Family: Not on file  . Frequency of Social Gatherings with Friends and Family: Not on file  . Attends Religious Services: Not on file  . Active Member of Clubs or Organizations: Not on file  . Attends Archivist Meetings: Not on file  . Marital Status: Not on file   Past Surgical History:  Procedure Laterality Date  . AMPUTATION Left 12/30/2012   Procedure: AMPUTATION RAY ;  Surgeon: Meredith Pel, MD;  [facility];  Service: Orthopedics;  Laterality: Left;  LEFT GREAT TOE RAY AMPUTATION  . AMPUTATION Left 02/23/2016   Procedure: Left Below Knee Amputation;  Surgeon: [name], MD;  Location: [facility];  Service: Orthopedics;  Laterality: Left;  . AMPUTATION Left 12/24/2017    Procedure: LEFT ABOVE KNEE AMPUTATION;  Surgeon: [name], MD;  Location: [facility];  Service: Orthopedics;  Laterality: Left;  . AMPUTATION Right 12/24/2017   Procedure: RIGHT 5TH RAY AMPUTATION;  Surgeon: [name], MD;  Location: [facility];  Service: Orthopedics;  Laterality: Right;  . AMPUTATION Right 12/11/2018   Procedure: RIGHT ABOVE KNEE AMPUTATION;  Surgeon: [name], MD;  Location: [facility];  Service: Orthopedics;  Laterality: Right;  . APPENDECTOMY    . BASAL CELL CARCINOMA EXCISION  2/16   left forearm  . BELPHAROPTOSIS REPAIR    . CARDIAC CATHETERIZATION  1998   Negative  . CATARACT EXTRACTION Right 2017   then lid surgery  . COLONOSCOPY    . CORONARY ARTERY BYPASS GRAFT  09/2005   5 bypasses per patient, Post op AFIB  . EYE SURGERY    . FOOT BONE EXCISION Left 11/03/2013   DR DUDA   . I & D EXTREMITY Left 11/03/2013   Procedure: Left Foot Partial Bone Excision Cuboid and Medial Cuneiform, Wound Closures;  Surgeon: [name], MD;  Location: [facility];  Service: Orthopedics;  Laterality: Left;  . INSERTION PROSTATE RADIATION SEED  2009   RT and seeds for prostate cancer  . KIDNEY STONE SURGERY  04/1993  . RCA stents  04/2003   EF 55%  . RETINAL DETACHMENT SURGERY  2002-2003  . thrombosed vein  1993   Right leg   Past Medical History:  Diagnosis Date  . Cellulitis and abscess of left lower extremity 12/20/2017  . Coronary atherosclerosis of unspecified type of vessel, native or graft   . Diabetes mellitus without complication ([place])    type 2  . Hemorrhage of rectum and anus   . History of kidney stones    passed stones  . Impotence of organic origin   . Internal hemorrhoids without mention of complication   . Left below-knee amputee ([place]) 02/23/2016  . Malignant neoplasm of prostate ([place])   . Mononeuritis of unspecified site   . Osteoarthrosis, unspecified whether generalized or localized, unspecified site   . Other and unspecified hyperlipidemia   .  Peripheral vascular disease ([place])   . Personal history of colonic polyps   . Personal history of diabetic foot ulcer    saw wound center, resolved 05/08/2010  . Personal history of gallstones   . Routine general medical examination at a health care facility   . Subacute osteomyelitis, right ankle and foot ([place])   . Type II or unspecified type diabetes mellitus with neurological manifestations, not stated as uncontrolled(250.60)   . Unspecified glaucoma(365.9)   . Unspecified sleep apnea    cpcp  .  Unspecified venous (peripheral) insufficiency    BP 126/71   Pulse 94   Temp 97.8 F (36.6 C)   Ht 6\' 2"  (1.88 m)   Wt 260 lb (117.9 kg)   SpO2 98%   BMI 33.38 kg/m   Opioid Risk Score:   Fall Risk Score:  `1  Depression screen PHQ 2/9  Depression screen [facility] 2/9 02/20/2018 02/19/2017 02/14/2016 02/13/2015 02/07/2014 02/07/2014  Decreased Interest 0 0 0 0 0 0  Down, Depressed, Hopeless 0 0 0 0 0 0  PHQ - 2 Score 0 0 0 0 0 0  Some recent data might be hidden     Review of Systems  Constitutional: Negative.   HENT: Negative.   Eyes: Negative.   Respiratory: Negative.   Cardiovascular: Negative.   Gastrointestinal: Negative.   Endocrine: Negative.   Genitourinary: Negative.   Musculoskeletal: Positive for arthralgias, gait problem and myalgias.  Skin: Negative.   Allergic/Immunologic: Negative.   Hematological: Negative.   Psychiatric/Behavioral: Negative.   All other systems reviewed and are negative.      Objective:   Physical Exam Constitutional: No distress . Vital signs reviewed. HEENT: EOMI, oral membranes moist Neck: supple Cardiovascular: RRR without murmur. No JVD    Respiratory: CTA Bilaterally without wheezes or rales. Normal effort    GI: BS +, non-tender, non-distended   Musculoskeletal:  Comments: Right AK with shrinker--edema less Neurological: He is alertand oriented to person, place, and time. Nocranial nerve deficit. UE 5/5 prox to distal. Able to  lift right off bed and move in all planes. LLE 4/5. No sensory deficits seen Skin:No rashnoted. He is not diaphoretic. Noerythema. RLE residual limb with shrinker applied.    Assessment & Plan:  Mr. Berendt presents for transitional care follow-up after CIR admission for unilateral right AKA (has history of left AKA).   Bilateral AKA: --I am referring patient for a PT Mobility/Seating Evaluation for Power Wheelchair. He requires this for preparing food, moving from room to room, going to bathroom, as well as enhancing his daily mobility for out-of home use. He has the physical and mental ability to operate a power wheelchair.   --Continue with home therapies for mobility and independence --Has great support system in fiance --Very positive attitude and content with progress. --Recommended to take Robaxin over Oxycodone at night for pain relief.  --Sleeping well, pain well controlled, on antibiotics for wound infection.   Thirty minutes of face to face patient care time were spent during this visit. All questions were encouraged and answered. Follow up with me in 4 weeks.

## 2018-12-31 NOTE — Addendum Note (Signed)
Addended by: Izora Ribas on: 12/31/2018 11:42 AM   Modules accepted: Orders

## 2019-01-04 ENCOUNTER — Encounter: Payer: Self-pay | Admitting: Physician Assistant

## 2019-01-04 ENCOUNTER — Other Ambulatory Visit: Payer: Self-pay

## 2019-01-04 ENCOUNTER — Ambulatory Visit (INDEPENDENT_AMBULATORY_CARE_PROVIDER_SITE_OTHER): Payer: Medicare Other | Admitting: Physician Assistant

## 2019-01-04 VITALS — Ht 74.0 in | Wt 260.0 lb

## 2019-01-04 DIAGNOSIS — Z89612 Acquired absence of left leg above knee: Secondary | ICD-10-CM

## 2019-01-04 NOTE — Progress Notes (Signed)
Office Visit Note   Patient: ARIANO [name]           Date of Birth: 12-21-1950           MRN: [id number] Visit Date: 01/04/2019              Requested by: [name], MD [address] [place],  [place] [address] PCP: [name], MD  Chief Complaint  Patient presents with  . Right Leg - Routine Post Op    12/11/2018 right AKA      HPI: Patient is here to follow-up on his right above-knee amputation.  He was having some wound issues last week.  He was placed on a course of oral antibiotics and is here to follow-up.  He does feel like some of the swelling has gone down in the amputation stump he denies any fever chills he is now 3 weeks status post amputation  Assessment & Plan: Visit Diagnoses: No diagnosis found.  Plan: I will keep him on antibiotics and plan to reevaluate 1 more time this week  Follow-Up Instructions: No follow-ups on file.   Ortho Exam  Patient is alert, oriented, no adenopathy, well-dressed, normal affect, normal respiratory effort. Focused examination demonstrates less induration and swelling than at his previous visit visit he still has one area approximately 1 cm that probes deep with serous drainage there is no foul odor or purulent drainage  Imaging: No results found. No images are attached to the encounter.  Labs: Lab Results  Component Value Date   HGBA1C 8.6 (H) 12/11/2018   HGBA1C 6.4 (A) 12/12/2017   HGBA1C 7.3 (A) 08/05/2017   ESRSEDRATE 35 (H) 03/31/2013   CRP 5.3 (H) 03/31/2013   REPTSTATUS 12/25/2017 FINAL 12/20/2017   GRAMSTAIN  12/30/2012    FEW WBC PRESENT, PREDOMINANTLY PMN RARE SQUAMOUS EPITHELIAL CELLS PRESENT ABUNDANT GRAM NEGATIVE RODS MODERATE GRAM POSITIVE COCCI IN PAIRS IN CLUSTERS   CULT  12/20/2017    NO GROWTH 5 DAYS Performed at [facility], [place] [address] [place], [place] [address]    LABORGA KLEBSIELLA OXYTOCA 12/15/2012   LABORGA PSEUDOMONAS AERUGINOSA 12/15/2012     Lab  Results  Component Value Date   ALBUMIN 2.9 (L) 12/15/2018   ALBUMIN 2.6 (L) 12/27/2017   ALBUMIN 2.7 (L) 12/26/2017    No results found for: MG No results found for: VD25OH  No results found for: PREALBUMIN CBC EXTENDED Latest Ref Rng & Units 12/15/2018 12/13/2018 12/12/2018  WBC 4.0 - 10.5 K/uL 10.1 10.5 15.7(H)  RBC 4.22 - 5.81 MIL/uL 4.07(L) 4.09(L) 4.22  HGB 13.0 - 17.0 g/dL 11.2(L) 11.1(L) 11.5(L)  HCT 39.0 - 52.0 % 33.9(L) 34.0(L) 35.5(L)  PLT 150 - 400 K/uL 282 257 295  NEUTROABS 1.7 - 7.7 K/uL 7.5 - -  LYMPHSABS 0.7 - 4.0 K/uL 1.2 - -     Body mass index is 33.38 kg/m.  Orders:  No orders of the defined types were placed in this encounter.  No orders of the defined types were placed in this encounter.    Procedures: No procedures performed  Clinical Data: No additional findings.  ROS:  All other systems negative, except as noted in the HPI. Review of Systems  Objective: Vital Signs: Ht 6\' 2"  (1.88 m)   Wt 260 lb (117.9 kg)   BMI 33.38 kg/m   Specialty Comments:  No specialty comments available.  PMFS History: Patient Active Problem List   Diagnosis Date Noted  .  Above knee amputation of right lower extremity ([place]) 12/14/2018  . Atherosclerosis of native artery of right leg with gangrene ([place]) 12/11/2018  . Hypoalbuminemia due to protein-calorie malnutrition ([place])   . Type 2 diabetes mellitus with peripheral neuropathy ([facility])   . Left above-knee amputee ([place]) 12/26/2017  . Unilateral AKA, left ([place])   . History of complete ray amputation of fifth toe of right foot ([place])   . Postoperative pain   . Acute blood loss anemia   . Essential hypertension   . Atherosclerosis of native arteries of extremities with gangrene, right leg ([place]) 02/19/2017  . Chronic renal disease, stage III 02/19/2017  . Ulcer of toe of right foot, limited to breakdown of skin ([place]) 10/11/2016  . Type 2 diabetes mellitus with diabetic polyneuropathy, with long-term current use  of insulin ([place]) 03/29/2015  . Advance directive discussed with patient 02/07/2014  . Leucocytosis 04/01/2013  . Normocytic anemia 04/01/2013  . Diabetic Charcot foot ([place]) 04/01/2013  . Routine general medical examination at a health care facility 12/25/2010  . PERSONAL HX COLONIC POLYPS 11/22/2009  . Venous (peripheral) insufficiency 06/29/2008  . Osteoarthritis, multiple sites 04/03/2007  . ERECTILE DYSFUNCTION, ORGANIC 06/13/2006  . Hyperlipemia 06/11/2006  . GLAUCOMA 06/11/2006  . Coronary atherosclerosis of native coronary artery 06/11/2006  . Obstructive sleep apnea 06/11/2006   Past Medical History:  Diagnosis Date  . Cellulitis and abscess of left lower extremity 12/20/2017  . Coronary atherosclerosis of unspecified type of vessel, native or graft   . Diabetes mellitus without complication ([place])    type 2  . Hemorrhage of rectum and anus   . History of kidney stones    passed stones  . Impotence of organic origin   . Internal hemorrhoids without mention of complication   . Left below-knee amputee ([place]) 02/23/2016  . Malignant neoplasm of prostate ([place])   . Mononeuritis of unspecified site   . Osteoarthrosis, unspecified whether generalized or localized, unspecified site   . Other and unspecified hyperlipidemia   . Peripheral vascular disease ([place])   . Personal history of colonic polyps   . Personal history of diabetic foot ulcer    saw wound center, resolved 05/08/2010  . Personal history of gallstones   . Routine general medical examination at a health care facility   . Subacute osteomyelitis, right ankle and foot ([place])   . Type II or unspecified type diabetes mellitus with neurological manifestations, not stated as uncontrolled(250.60)   . Unspecified glaucoma(365.9)   . Unspecified sleep apnea    cpcp  . Unspecified venous (peripheral) insufficiency     Family History  Problem Relation Age of Onset  . Lung cancer Father   . Multiple sclerosis Mother   . Heart  attack Other        paternal aunts and uncles  . Peripheral vascular disease Maternal Grandfather        several amputations    Past Surgical History:  Procedure Laterality Date  . AMPUTATION Left 12/30/2012   Procedure: AMPUTATION RAY ;  Surgeon: Meredith Pel, MD;  [facility];  Service: Orthopedics;  Laterality: Left;  LEFT GREAT TOE RAY AMPUTATION  . AMPUTATION Left 02/23/2016   Procedure: Left Below Knee Amputation;  Surgeon: [name], MD;  Location: [facility];  Service: Orthopedics;  Laterality: Left;  . AMPUTATION Left 12/24/2017   Procedure: LEFT ABOVE KNEE AMPUTATION;  Surgeon: [name], MD;  Location: [facility];  Service: Orthopedics;  Laterality: Left;  .  AMPUTATION Right 12/24/2017   Procedure: RIGHT 5TH RAY AMPUTATION;  Surgeon: [name], MD;  Location: [facility];  Service: Orthopedics;  Laterality: Right;  . AMPUTATION Right 12/11/2018   Procedure: RIGHT ABOVE KNEE AMPUTATION;  Surgeon: [name], MD;  Location: [facility];  Service: Orthopedics;  Laterality: Right;  . APPENDECTOMY    . BASAL CELL CARCINOMA EXCISION  2/16   left forearm  . BELPHAROPTOSIS REPAIR    . CARDIAC CATHETERIZATION  1998   Negative  . CATARACT EXTRACTION Right 2017   then lid surgery  . COLONOSCOPY    . CORONARY ARTERY BYPASS GRAFT  09/2005   5 bypasses per patient, Post op AFIB  . EYE SURGERY    . FOOT BONE EXCISION Left 11/03/2013   DR DUDA   . I & D EXTREMITY Left 11/03/2013   Procedure: Left Foot Partial Bone Excision Cuboid and Medial Cuneiform, Wound Closures;  Surgeon: [name], MD;  Location: [facility];  Service: Orthopedics;  Laterality: Left;  . INSERTION PROSTATE RADIATION SEED  2009   RT and seeds for prostate cancer  . KIDNEY STONE SURGERY  04/1993  . RCA stents  04/2003   EF 55%  . RETINAL DETACHMENT SURGERY  2002-2003  . thrombosed vein  1993   Right leg   Social History   Occupational History  . Occupation: Heavy equipment mechanic--retired  Tobacco Use   . Smoking status: Former Smoker    Packs/day: 2.00    Years: 35.00    Pack years: 70.00    Types: Cigars, Cigarettes    Quit date: 01/07/2001    Years since quitting: 18.0  . Smokeless tobacco: Never Used  Substance and Sexual Activity  . Alcohol use: Not Currently    Comment: rare  . Drug use: No  . Sexual activity: Yes    Partners: Female

## 2019-01-07 ENCOUNTER — Ambulatory Visit (INDEPENDENT_AMBULATORY_CARE_PROVIDER_SITE_OTHER): Payer: Medicare Other | Admitting: Orthopedic Surgery

## 2019-01-07 ENCOUNTER — Other Ambulatory Visit: Payer: Self-pay

## 2019-01-07 ENCOUNTER — Encounter: Payer: Self-pay | Admitting: Physician Assistant

## 2019-01-07 VITALS — Ht 74.0 in | Wt 260.0 lb

## 2019-01-07 DIAGNOSIS — Z89611 Acquired absence of right leg above knee: Secondary | ICD-10-CM

## 2019-01-09 ENCOUNTER — Other Ambulatory Visit (INDEPENDENT_AMBULATORY_CARE_PROVIDER_SITE_OTHER): Payer: Self-pay | Admitting: Physician Assistant

## 2019-01-13 DIAGNOSIS — Z89611 Acquired absence of right leg above knee: Secondary | ICD-10-CM | POA: Diagnosis not present

## 2019-01-13 DIAGNOSIS — Z6838 Body mass index (BMI) 38.0-38.9, adult: Secondary | ICD-10-CM

## 2019-01-13 DIAGNOSIS — C61 Malignant neoplasm of prostate: Secondary | ICD-10-CM

## 2019-01-13 DIAGNOSIS — Z7982 Long term (current) use of aspirin: Secondary | ICD-10-CM

## 2019-01-13 DIAGNOSIS — M1990 Unspecified osteoarthritis, unspecified site: Secondary | ICD-10-CM

## 2019-01-13 DIAGNOSIS — Z87891 Personal history of nicotine dependence: Secondary | ICD-10-CM

## 2019-01-13 DIAGNOSIS — Z951 Presence of aortocoronary bypass graft: Secondary | ICD-10-CM

## 2019-01-13 DIAGNOSIS — E7849 Other hyperlipidemia: Secondary | ICD-10-CM

## 2019-01-13 DIAGNOSIS — H409 Unspecified glaucoma: Secondary | ICD-10-CM

## 2019-01-13 DIAGNOSIS — I2510 Atherosclerotic heart disease of native coronary artery without angina pectoris: Secondary | ICD-10-CM

## 2019-01-13 DIAGNOSIS — E1142 Type 2 diabetes mellitus with diabetic polyneuropathy: Secondary | ICD-10-CM

## 2019-01-13 DIAGNOSIS — G4733 Obstructive sleep apnea (adult) (pediatric): Secondary | ICD-10-CM

## 2019-01-13 DIAGNOSIS — E1151 Type 2 diabetes mellitus with diabetic peripheral angiopathy without gangrene: Secondary | ICD-10-CM | POA: Diagnosis not present

## 2019-01-13 DIAGNOSIS — K5900 Constipation, unspecified: Secondary | ICD-10-CM

## 2019-01-13 DIAGNOSIS — Z794 Long term (current) use of insulin: Secondary | ICD-10-CM

## 2019-01-13 DIAGNOSIS — Z89612 Acquired absence of left leg above knee: Secondary | ICD-10-CM

## 2019-01-13 DIAGNOSIS — Z4781 Encounter for orthopedic aftercare following surgical amputation: Secondary | ICD-10-CM | POA: Diagnosis not present

## 2019-01-13 DIAGNOSIS — I70201 Unspecified atherosclerosis of native arteries of extremities, right leg: Secondary | ICD-10-CM | POA: Diagnosis not present

## 2019-01-13 DIAGNOSIS — Z86718 Personal history of other venous thrombosis and embolism: Secondary | ICD-10-CM

## 2019-01-15 ENCOUNTER — Other Ambulatory Visit: Payer: Self-pay | Admitting: Internal Medicine

## 2019-01-19 ENCOUNTER — Encounter: Payer: Self-pay | Admitting: Orthopedic Surgery

## 2019-01-19 DIAGNOSIS — H409 Unspecified glaucoma: Secondary | ICD-10-CM | POA: Diagnosis not present

## 2019-01-19 DIAGNOSIS — E1151 Type 2 diabetes mellitus with diabetic peripheral angiopathy without gangrene: Secondary | ICD-10-CM | POA: Diagnosis not present

## 2019-01-19 DIAGNOSIS — Z89612 Acquired absence of left leg above knee: Secondary | ICD-10-CM | POA: Diagnosis not present

## 2019-01-19 DIAGNOSIS — G4733 Obstructive sleep apnea (adult) (pediatric): Secondary | ICD-10-CM | POA: Diagnosis not present

## 2019-01-19 DIAGNOSIS — Z86718 Personal history of other venous thrombosis and embolism: Secondary | ICD-10-CM | POA: Diagnosis not present

## 2019-01-19 DIAGNOSIS — M1990 Unspecified osteoarthritis, unspecified site: Secondary | ICD-10-CM | POA: Diagnosis not present

## 2019-01-19 DIAGNOSIS — I70201 Unspecified atherosclerosis of native arteries of extremities, right leg: Secondary | ICD-10-CM | POA: Diagnosis not present

## 2019-01-19 DIAGNOSIS — Z794 Long term (current) use of insulin: Secondary | ICD-10-CM | POA: Diagnosis not present

## 2019-01-19 DIAGNOSIS — Z87891 Personal history of nicotine dependence: Secondary | ICD-10-CM | POA: Diagnosis not present

## 2019-01-19 DIAGNOSIS — Z6838 Body mass index (BMI) 38.0-38.9, adult: Secondary | ICD-10-CM | POA: Diagnosis not present

## 2019-01-19 DIAGNOSIS — Z89611 Acquired absence of right leg above knee: Secondary | ICD-10-CM | POA: Diagnosis not present

## 2019-01-19 DIAGNOSIS — I2510 Atherosclerotic heart disease of native coronary artery without angina pectoris: Secondary | ICD-10-CM | POA: Diagnosis not present

## 2019-01-19 DIAGNOSIS — E1142 Type 2 diabetes mellitus with diabetic polyneuropathy: Secondary | ICD-10-CM | POA: Diagnosis not present

## 2019-01-19 DIAGNOSIS — Z4781 Encounter for orthopedic aftercare following surgical amputation: Secondary | ICD-10-CM | POA: Diagnosis not present

## 2019-01-19 DIAGNOSIS — K5900 Constipation, unspecified: Secondary | ICD-10-CM | POA: Diagnosis not present

## 2019-01-19 DIAGNOSIS — Z951 Presence of aortocoronary bypass graft: Secondary | ICD-10-CM | POA: Diagnosis not present

## 2019-01-19 DIAGNOSIS — Z7982 Long term (current) use of aspirin: Secondary | ICD-10-CM | POA: Diagnosis not present

## 2019-01-19 DIAGNOSIS — E7849 Other hyperlipidemia: Secondary | ICD-10-CM | POA: Diagnosis not present

## 2019-01-19 DIAGNOSIS — C61 Malignant neoplasm of prostate: Secondary | ICD-10-CM | POA: Diagnosis not present

## 2019-01-19 NOTE — Progress Notes (Signed)
Office Visit Note   Patient: [name]           Date of Birth: 03-18-1950           MRN: [id number] Visit Date: 01/07/2019              Requested by: [name], MD [place],  [place] [id number] PCP: [name], MD  Chief Complaint  Patient presents with  . Right Leg - Routine Post Op    12/11/2018 right AKA      HPI: Patient is a 69 year old gentleman who presents 4 weeks status post right above-the-knee amputation.  Assessment & Plan: Visit Diagnoses:  1. S/P AKA (above knee amputation), right ([place])     Plan: The incision is well approximated.  Will need to follow-up in 2 weeks for evaluation for prosthetic fitting.  Patient is currently set up with both [facility] and [facility] for prosthetic fitting as we will need to coordinate this for 1 prosthetists.  Follow-Up Instructions: Return in about 2 weeks (around 01/21/2019).   Ortho Exam  Patient is alert, oriented, no adenopathy, well-dressed, normal affect, normal respiratory effort. Examination of the incision has some clear serosanguineous drainage there is no cellulitis the wound edges are well approximated.  Imaging: No results found. No images are attached to the encounter.  Labs: Lab Results  Component Value Date   HGBA1C 8.6 (H) 12/11/2018   HGBA1C 6.4 (A) 12/12/2017   HGBA1C 7.3 (A) 08/05/2017   ESRSEDRATE 35 (H) 03/31/2013   CRP 5.3 (H) 03/31/2013   REPTSTATUS 12/25/2017 FINAL 12/20/2017   GRAMSTAIN  12/30/2012    FEW WBC PRESENT, PREDOMINANTLY PMN RARE SQUAMOUS EPITHELIAL CELLS PRESENT ABUNDANT GRAM NEGATIVE RODS MODERATE GRAM POSITIVE COCCI IN PAIRS IN CLUSTERS   CULT  12/20/2017    NO GROWTH 5 DAYS Performed at [facility], [place] [address] [place], [place] [address]    LABORGA KLEBSIELLA OXYTOCA 12/15/2012   LABORGA PSEUDOMONAS AERUGINOSA 12/15/2012     Lab Results  Component Value Date   ALBUMIN 2.9 (L) 12/15/2018   ALBUMIN 2.6 (L) 12/27/2017   ALBUMIN 2.7 (L) 12/26/2017    No results found for: MG No results found for: VD25OH  No results found for: PREALBUMIN CBC EXTENDED Latest Ref Rng & Units 12/15/2018 12/13/2018 12/12/2018  WBC 4.0 - 10.5 K/uL 10.1 10.5 15.7(H)  RBC 4.22 - 5.81 MIL/uL 4.07(L) 4.09(L) 4.22  HGB 13.0 - 17.0 g/dL 11.2(L) 11.1(L) 11.5(L)  HCT 39.0 - 52.0 % 33.9(L) 34.0(L) 35.5(L)  PLT 150 - 400 K/uL 282 257 295  NEUTROABS 1.7 - 7.7 K/uL 7.5 - -  LYMPHSABS 0.7 - 4.0 K/uL 1.2 - -     Body mass index is 33.38 kg/m.  Orders:  No orders of the defined types were placed in this encounter.  No orders of the defined types were placed in this encounter.    Procedures: No procedures performed  Clinical Data: No additional findings.  ROS:  All other systems negative, except as noted in the HPI. Review of Systems  Objective: Vital Signs: Ht 6\' 2"  (1.88 m)   Wt 260 lb (117.9 kg)   BMI 33.38 kg/m   Specialty Comments:  No specialty comments available.  PMFS History: Patient Active Problem List   Diagnosis Date Noted  . Above knee amputation of right lower extremity ([place]) 12/14/2018  . Atherosclerosis of native artery of right leg with gangrene ([place]) 12/11/2018  . Hypoalbuminemia due  to protein-calorie malnutrition ([place])   . Type 2 diabetes mellitus with peripheral neuropathy (HCC)   . Left above-knee amputee ([place]) 12/26/2017  . Unilateral AKA, left ([place])   . History of complete ray amputation of fifth toe of right foot ([place])   . Postoperative pain   . Acute blood loss anemia   . Essential hypertension   . Atherosclerosis of native arteries of extremities with gangrene, right leg ([place]) 02/19/2017  . Chronic renal disease, stage III 02/19/2017  . Ulcer of toe of right foot, limited to breakdown of skin ([place]) 10/11/2016  . Type 2 diabetes mellitus with diabetic polyneuropathy, with long-term current use of insulin ([place]) 03/29/2015  . Advance directive discussed with patient 02/07/2014  .  Leucocytosis 04/01/2013  . Normocytic anemia 04/01/2013  . Diabetic Charcot foot ([place]) 04/01/2013  . Routine general medical examination at a health care facility 12/25/2010  . PERSONAL HX COLONIC POLYPS 11/22/2009  . Venous (peripheral) insufficiency 06/29/2008  . Osteoarthritis, multiple sites 04/03/2007  . ERECTILE DYSFUNCTION, ORGANIC 06/13/2006  . Hyperlipemia 06/11/2006  . GLAUCOMA 06/11/2006  . Coronary atherosclerosis of native coronary artery 06/11/2006  . Obstructive sleep apnea 06/11/2006   Past Medical History:  Diagnosis Date  . Cellulitis and abscess of left lower extremity 12/20/2017  . Coronary atherosclerosis of unspecified type of vessel, native or graft   . Diabetes mellitus without complication ([place])    type 2  . Hemorrhage of rectum and anus   . History of kidney stones    passed stones  . Impotence of organic origin   . Internal hemorrhoids without mention of complication   . Left below-knee amputee ([place]) 02/23/2016  . Malignant neoplasm of prostate ([place])   . Mononeuritis of unspecified site   . Osteoarthrosis, unspecified whether generalized or localized, unspecified site   . Other and unspecified hyperlipidemia   . Peripheral vascular disease ([place])   . Personal history of colonic polyps   . Personal history of diabetic foot ulcer    saw wound center, resolved 05/08/2010  . Personal history of gallstones   . Routine general medical examination at a health care facility   . Subacute osteomyelitis, right ankle and foot ([place])   . Type II or unspecified type diabetes mellitus with neurological manifestations, not stated as uncontrolled(250.60)   . Unspecified glaucoma(365.9)   . Unspecified sleep apnea    cpcp  . Unspecified venous (peripheral) insufficiency     Family History  Problem Relation Age of Onset  . Lung cancer Father   . Multiple sclerosis Mother   . Heart attack Other        paternal aunts and uncles  . Peripheral vascular disease Maternal  Grandfather        several amputations    Past Surgical History:  Procedure Laterality Date  . AMPUTATION Left 12/30/2012   Procedure: AMPUTATION RAY ;  Surgeon: Meredith Pel, MD;  Location: [facility];  Service: Orthopedics;  Laterality: Left;  LEFT GREAT TOE RAY AMPUTATION  . AMPUTATION Left 02/23/2016   Procedure: Left Below Knee Amputation;  Surgeon: [name], MD;  Location: [facility];  Service: Orthopedics;  Laterality: Left;  . AMPUTATION Left 12/24/2017   Procedure: LEFT ABOVE KNEE AMPUTATION;  Surgeon: [name], MD;  Location: [facility];  Service: Orthopedics;  Laterality: Left;  . AMPUTATION Right 12/24/2017   Procedure: RIGHT 5TH RAY AMPUTATION;  Surgeon: [name], MD;  Location: [facility];  Service: Orthopedics;  Laterality: Right;  .  AMPUTATION Right 12/11/2018   Procedure: RIGHT ABOVE KNEE AMPUTATION;  Surgeon: [name], MD;  Location: [facility];  Service: Orthopedics;  Laterality: Right;  . APPENDECTOMY    . BASAL CELL CARCINOMA EXCISION  2/16   left forearm  . BELPHAROPTOSIS REPAIR    . CARDIAC CATHETERIZATION  1998   Negative  . CATARACT EXTRACTION Right 2017   then lid surgery  . COLONOSCOPY    . CORONARY ARTERY BYPASS GRAFT  09/2005   5 bypasses per patient, Post op AFIB  . EYE SURGERY    . FOOT BONE EXCISION Left 11/03/2013   DR DUDA   . I & D EXTREMITY Left 11/03/2013   Procedure: Left Foot Partial Bone Excision Cuboid and Medial Cuneiform, Wound Closures;  Surgeon: [name], MD;  Location: [facility];  Service: Orthopedics;  Laterality: Left;  . INSERTION PROSTATE RADIATION SEED  2009   RT and seeds for prostate cancer  . KIDNEY STONE SURGERY  04/1993  . RCA stents  04/2003   EF 55%  . RETINAL DETACHMENT SURGERY  2002-2003  . thrombosed vein  1993   Right leg   Social History   Occupational History  . Occupation: Heavy equipment mechanic--retired  Tobacco Use  . Smoking status: Former Smoker    Packs/day: 2.00    Years: 35.00    Pack years:  70.00    Types: Cigars, Cigarettes    Quit date: 01/07/2001    Years since quitting: 18.0  . Smokeless tobacco: Never Used  Substance and Sexual Activity  . Alcohol use: Not Currently    Comment: rare  . Drug use: No  . Sexual activity: Yes    Partners: Female

## 2019-01-20 ENCOUNTER — Other Ambulatory Visit: Payer: Self-pay | Admitting: Physician Assistant

## 2019-01-21 ENCOUNTER — Encounter: Payer: Self-pay | Admitting: Orthopedic Surgery

## 2019-01-21 ENCOUNTER — Ambulatory Visit: Payer: Medicare Other | Attending: Physical Medicine and Rehabilitation | Admitting: Physical Therapy

## 2019-01-21 ENCOUNTER — Ambulatory Visit (INDEPENDENT_AMBULATORY_CARE_PROVIDER_SITE_OTHER): Payer: Medicare Other | Admitting: Orthopedic Surgery

## 2019-01-21 ENCOUNTER — Other Ambulatory Visit: Payer: Self-pay

## 2019-01-21 VITALS — Ht 74.0 in | Wt 260.0 lb

## 2019-01-21 DIAGNOSIS — M6281 Muscle weakness (generalized): Secondary | ICD-10-CM | POA: Diagnosis not present

## 2019-01-21 DIAGNOSIS — M25652 Stiffness of left hip, not elsewhere classified: Secondary | ICD-10-CM | POA: Insufficient documentation

## 2019-01-21 DIAGNOSIS — M25512 Pain in left shoulder: Secondary | ICD-10-CM | POA: Diagnosis not present

## 2019-01-21 DIAGNOSIS — G8929 Other chronic pain: Secondary | ICD-10-CM | POA: Insufficient documentation

## 2019-01-21 DIAGNOSIS — M25651 Stiffness of right hip, not elsewhere classified: Secondary | ICD-10-CM | POA: Diagnosis not present

## 2019-01-21 DIAGNOSIS — R29898 Other symptoms and signs involving the musculoskeletal system: Secondary | ICD-10-CM | POA: Diagnosis not present

## 2019-01-21 DIAGNOSIS — Z89611 Acquired absence of right leg above knee: Secondary | ICD-10-CM

## 2019-01-21 NOTE — Progress Notes (Signed)
Office Visit Note   Patient: [name]           Date of Birth: 1950-12-13           MRN: [id number] Visit Date: 01/21/2019              Requested by: [name], MD [place],  [place] [address] PCP: [name], MD  Chief Complaint  Patient presents with  . Right Leg - Routine Post Op    12/11/2018 right AKA      HPI: Patient is status post right above-the-knee amputation.  He is approximately 5 weeks out he states he still has drainage from a small midline wound.  He is finishing up his antibiotics.  Assessment & Plan: Visit Diagnoses:  1. S/P AKA (above knee amputation), right ([place])     Plan: We will harvest the sutures and staples today his drainage appears to be coming from the retained Vicryl stitch.  Reevaluate in 2 weeks  Follow-Up Instructions: Return in about 2 weeks (around 02/04/2019).   Ortho Exam  Patient is alert, oriented, no adenopathy, well-dressed, normal affect, normal respiratory effort. Examination the incision is well-healed there is no redness no cellulitis there is some serosanguineous drainage midline.  This area was debrided is about 5 mm in diameter and a few pieces of Vicryl suture were removed from the wound.  This may be the source of the drainage.  Sutures and staples removed today.  Imaging: No results found. No images are attached to the encounter.  Labs: Lab Results  Component Value Date   HGBA1C 8.6 (H) 12/11/2018   HGBA1C 6.4 (A) 12/12/2017   HGBA1C 7.3 (A) 08/05/2017   ESRSEDRATE 35 (H) 03/31/2013   CRP 5.3 (H) 03/31/2013   REPTSTATUS 12/25/2017 FINAL 12/20/2017   GRAMSTAIN  12/30/2012    FEW WBC PRESENT, PREDOMINANTLY PMN RARE SQUAMOUS EPITHELIAL CELLS PRESENT ABUNDANT GRAM NEGATIVE RODS MODERATE GRAM POSITIVE COCCI IN PAIRS IN CLUSTERS   CULT  12/20/2017    NO GROWTH 5 DAYS Performed at [facility], [place] [address], [place], [place] [address]    LABORGA KLEBSIELLA OXYTOCA  12/15/2012   LABORGA PSEUDOMONAS AERUGINOSA 12/15/2012     Lab Results  Component Value Date   ALBUMIN 2.9 (L) 12/15/2018   ALBUMIN 2.6 (L) 12/27/2017   ALBUMIN 2.7 (L) 12/26/2017    No results found for: MG No results found for: VD25OH  No results found for: PREALBUMIN CBC EXTENDED Latest Ref Rng & Units 12/15/2018 12/13/2018 12/12/2018  WBC 4.0 - 10.5 K/uL 10.1 10.5 15.7(H)  RBC 4.22 - 5.81 MIL/uL 4.07(L) 4.09(L) 4.22  HGB 13.0 - 17.0 g/dL 11.2(L) 11.1(L) 11.5(L)  HCT 39.0 - 52.0 % 33.9(L) 34.0(L) 35.5(L)  PLT 150 - 400 K/uL 282 257 295  NEUTROABS 1.7 - 7.7 K/uL 7.5 - -  LYMPHSABS 0.7 - 4.0 K/uL 1.2 - -     Body mass index is 33.38 kg/m.  Orders:  No orders of the defined types were placed in this encounter.  No orders of the defined types were placed in this encounter.    Procedures: No procedures performed  Clinical Data: No additional findings.  ROS:  All other systems negative, except as noted in the HPI. Review of Systems  Objective: Vital Signs: Ht 6\' 2"  (1.88 m)   Wt 260 lb (117.9 kg)   BMI 33.38 kg/m   Specialty Comments:  No specialty comments available.  PMFS History:  Patient Active Problem List   Diagnosis Date Noted  . Above knee amputation of right lower extremity ([place]) 12/14/2018  . Atherosclerosis of native artery of right leg with gangrene ([place]) 12/11/2018  . Hypoalbuminemia due to protein-calorie malnutrition ([place])   . Type 2 diabetes mellitus with peripheral neuropathy (HCC)   . Left above-knee amputee ([place]) 12/26/2017  . Unilateral AKA, left ([place])   . History of complete ray amputation of fifth toe of right foot ([place])   . Postoperative pain   . Acute blood loss anemia   . Essential hypertension   . Atherosclerosis of native arteries of extremities with gangrene, right leg ([place]) 02/19/2017  . Chronic renal disease, stage III 02/19/2017  . Ulcer of toe of right foot, limited to breakdown of skin ([place]) 10/11/2016  . Type 2  diabetes mellitus with diabetic polyneuropathy, with long-term current use of insulin ([place]) 03/29/2015  . Advance directive discussed with patient 02/07/2014  . Leucocytosis 04/01/2013  . Normocytic anemia 04/01/2013  . Diabetic Charcot foot ([place]) 04/01/2013  . Routine general medical examination at a health care facility 12/25/2010  . PERSONAL HX COLONIC POLYPS 11/22/2009  . Venous (peripheral) insufficiency 06/29/2008  . Osteoarthritis, multiple sites 04/03/2007  . ERECTILE DYSFUNCTION, ORGANIC 06/13/2006  . Hyperlipemia 06/11/2006  . GLAUCOMA 06/11/2006  . Coronary atherosclerosis of native coronary artery 06/11/2006  . Obstructive sleep apnea 06/11/2006   Past Medical History:  Diagnosis Date  . Cellulitis and abscess of left lower extremity 12/20/2017  . Coronary atherosclerosis of unspecified type of vessel, native or graft   . Diabetes mellitus without complication ([place])    type 2  . Hemorrhage of rectum and anus   . History of kidney stones    passed stones  . Impotence of organic origin   . Internal hemorrhoids without mention of complication   . Left below-knee amputee ([place]) 02/23/2016  . Malignant neoplasm of prostate ([place])   . Mononeuritis of unspecified site   . Osteoarthrosis, unspecified whether generalized or localized, unspecified site   . Other and unspecified hyperlipidemia   . Peripheral vascular disease ([place])   . Personal history of colonic polyps   . Personal history of diabetic foot ulcer    saw wound center, resolved 05/08/2010  . Personal history of gallstones   . Routine general medical examination at a health care facility   . Subacute osteomyelitis, right ankle and foot ([place])   . Type II or unspecified type diabetes mellitus with neurological manifestations, not stated as uncontrolled(250.60)   . Unspecified glaucoma(365.9)   . Unspecified sleep apnea    cpcp  . Unspecified venous (peripheral) insufficiency     Family History  Problem Relation Age  of Onset  . Lung cancer Father   . Multiple sclerosis Mother   . Heart attack Other        paternal aunts and uncles  . Peripheral vascular disease Maternal Grandfather        several amputations    Past Surgical History:  Procedure Laterality Date  . AMPUTATION Left 12/30/2012   Procedure: AMPUTATION RAY ;  Surgeon: Meredith Pel, MD;  [facility];  Service: Orthopedics;  Laterality: Left;  LEFT GREAT TOE RAY AMPUTATION  . AMPUTATION Left 02/23/2016   Procedure: Left Below Knee Amputation;  Surgeon: [name], MD;  Location: [facility];  Service: Orthopedics;  Laterality: Left;  . AMPUTATION Left 12/24/2017   Procedure: LEFT ABOVE KNEE AMPUTATION;  Surgeon: [name], MD;  Location: [facility];  Service: Orthopedics;  Laterality: Left;  . AMPUTATION Right 12/24/2017   Procedure: RIGHT 5TH RAY AMPUTATION;  Surgeon: [name], MD;  Location: [facility];  Service: Orthopedics;  Laterality: Right;  . AMPUTATION Right 12/11/2018   Procedure: RIGHT ABOVE KNEE AMPUTATION;  Surgeon: [name], MD;  Location: [facility];  Service: Orthopedics;  Laterality: Right;  . APPENDECTOMY    . BASAL CELL CARCINOMA EXCISION  2/16   left forearm  . BELPHAROPTOSIS REPAIR    . CARDIAC CATHETERIZATION  1998   Negative  . CATARACT EXTRACTION Right 2017   then lid surgery  . COLONOSCOPY    . CORONARY ARTERY BYPASS GRAFT  09/2005   5 bypasses per patient, Post op AFIB  . EYE SURGERY    . FOOT BONE EXCISION Left 11/03/2013   DR DUDA   . I & D EXTREMITY Left 11/03/2013   Procedure: Left Foot Partial Bone Excision Cuboid and Medial Cuneiform, Wound Closures;  Surgeon: [name], MD;  Location: [facility];  Service: Orthopedics;  Laterality: Left;  . INSERTION PROSTATE RADIATION SEED  2009   RT and seeds for prostate cancer  . KIDNEY STONE SURGERY  04/1993  . RCA stents  04/2003   EF 55%  . RETINAL DETACHMENT SURGERY  2002-2003  . thrombosed vein  1993   Right leg   Social History   Occupational  History  . Occupation: Heavy equipment mechanic--retired  Tobacco Use  . Smoking status: Former Smoker    Packs/day: 2.00    Years: 35.00    Pack years: 70.00    Types: Cigars, Cigarettes    Quit date: 01/07/2001    Years since quitting: 18.0  . Smokeless tobacco: Never Used  Substance and Sexual Activity  . Alcohol use: Not Currently    Comment: rare  . Drug use: No  . Sexual activity: Yes    Partners: Female

## 2019-01-24 NOTE — Therapy (Addendum)
[facility] [address] [place], [place], [address] Phone: [phone]   Fax:  [phone]  Physical Therapy Evaluation  Patient Details  Name: [name] MRN: [id number] Date of Birth: 1950-02-15 Referring Provider (PT): Ranell Patrick Clide Deutscher, MD   Encounter Date: 01/21/2019  PT End of Session - 01/24/19 1437    Visit Number  1    Number of Visits  1    Date for PT Re-Evaluation  01/21/19    Authorization Type  Medicare/[employer] [place]    PT Start Time  9476    PT Stop Time  1520    PT Time Calculation (min)  60 min    Activity Tolerance  Patient tolerated treatment well    Behavior During Therapy  [facility] for tasks assessed/performed       Past Medical History:  Diagnosis Date  . Cellulitis and abscess of left lower extremity 12/20/2017  . Coronary atherosclerosis of unspecified type of vessel, native or graft   . Diabetes mellitus without complication ([place])    type 2  . Hemorrhage of rectum and anus   . History of kidney stones    passed stones  . Impotence of organic origin   . Internal hemorrhoids without mention of complication   . Left below-knee amputee ([place]) 02/23/2016  . Malignant neoplasm of prostate ([place])   . Mononeuritis of unspecified site   . Osteoarthrosis, unspecified whether generalized or localized, unspecified site   . Other and unspecified hyperlipidemia   . Peripheral vascular disease ([place])   . Personal history of colonic polyps   . Personal history of diabetic foot ulcer    saw wound center, resolved 05/08/2010  . Personal history of gallstones   . Routine general medical examination at a health care facility   . Subacute osteomyelitis, right ankle and foot ([place])   . Type II or unspecified type diabetes mellitus with neurological manifestations, not stated as uncontrolled(250.60)   . Unspecified glaucoma(365.9)   . Unspecified sleep apnea    cpcp  . Unspecified venous (peripheral)  insufficiency     Past Surgical History:  Procedure Laterality Date  . AMPUTATION Left 12/30/2012   Procedure: AMPUTATION RAY ;  Surgeon: Martin Pel, MD;  Location: [facility];  Service: Orthopedics;  Laterality: Left;  LEFT GREAT TOE RAY AMPUTATION  . AMPUTATION Left 02/23/2016   Procedure: Left Below Knee Amputation;  Surgeon: [name], MD;  Location: [place];  Service: Orthopedics;  Laterality: Left;  . AMPUTATION Left 12/24/2017   Procedure: LEFT ABOVE KNEE AMPUTATION;  Surgeon: [name], MD;  Location: [place];  Service: Orthopedics;  Laterality: Left;  . AMPUTATION Right 12/24/2017   Procedure: RIGHT 5TH RAY AMPUTATION;  Surgeon: [name], MD;  Location: [place];  Service: Orthopedics;  Laterality: Right;  . AMPUTATION Right 12/11/2018   Procedure: RIGHT ABOVE KNEE AMPUTATION;  Surgeon: [name], MD;  Location: [facility];  Service: Orthopedics;  Laterality: Right;  . APPENDECTOMY    . BASAL CELL CARCINOMA EXCISION  2/16   left forearm  . BELPHAROPTOSIS REPAIR    . CARDIAC CATHETERIZATION  1998   Negative  . CATARACT EXTRACTION Right 2017   then lid surgery  . COLONOSCOPY    . CORONARY ARTERY BYPASS GRAFT  09/2005   5 bypasses per patient, Post op AFIB  . EYE SURGERY    . FOOT BONE EXCISION Left 11/03/2013   DR DUDA   .  I & D EXTREMITY Left 11/03/2013   Procedure: Left Foot Partial Bone Excision Cuboid and Medial Cuneiform, Wound Closures;  Surgeon: [name], MD;  Location: [facility];  Service: Orthopedics;  Laterality: Left;  . INSERTION PROSTATE RADIATION SEED  2009   RT and seeds for prostate cancer  . KIDNEY STONE SURGERY  04/1993  . RCA stents  04/2003   EF 55%  . RETINAL DETACHMENT SURGERY  2002-2003  . thrombosed vein  1993   Right leg    There were no vitals filed for this visit.   Subjective Assessment - 01/24/19 1433    Subjective  Pt accompanied by significant other; presents for evaluation for power mobility due to bilat AKA - unable to use  bilat above knee prosthetics.    Patient is accompained by:  Family member    Pertinent History  type 2 DM with peripheral neuropathy, anemia, HTN, Chronic renal disease - stage III, diabetic charcot foot, venous insufficiency, glaucoma, CAD, OSA, LLE cellulitis, malignant neoplasm of prostate, OA, L Great toe ray amputation 12/30/2012; L BKA 02/23/2016;  L AKA 12/24/2017, R 5th ray amputation 12/24/2017, R AKA 12/11/2018, CABG x 5, insertion of prostate radiation seed, retinal detachment surgery         [facility] PT Assessment - 01/24/19 1436      Assessment   Medical Diagnosis  Bilat AKA    Referring Provider (PT)  Izora Ribas, MD    Onset Date/Surgical Date  01/20/19    Prior Therapy  yes - for L BKA prosthetic training      Precautions   Precautions  Fall    Precaution Comments  type 2 DM with peripheral neuropathy, anemia, HTN, Chronic renal disease - stage III, diabetic charcot foot, venous insufficiency, glaucoma, CAD, OSA, LLE cellulitis, malignant neoplasm of prostate, OA, L Great toe ray amputation 12/30/2012; L BKA 02/23/2016;  L AKA 12/24/2017, R 5th ray amputation 12/24/2017, R AKA 12/11/2018, CABG x 5, insertion of prostate radiation seed, retinal detachment surgery      Prior Function   Level of Independence  Independent with household mobility with device;Independent with homemaking with wheelchair;Independent with transfers         Mobility/Seating Evaluation    PATIENT INFORMATION: Name: [name] DOB: 06-24-1950  Sex: Male Date seen: 01/21/2019 Time: 14:20  Address:  [address] [place], [place] [address] Physician: Leeroy Cha, MD This evaluation/justification form will serve as the LMN for the following suppliers: __________________________ Supplier: [employer] Contact Person: Luz Brazen, ATP Phone:  [phone]   Seating Therapist: Misty Stanley, PT, DPT Phone:   [phone]   Phone: [phone]    Spouse/Parent/Caregiver name: Charletta Cousin  Phone number: [phone] Insurance/Payer: Medicare/[employer] [place]     Reason for Referral: Power mobility evaluation  Patient/Caregiver Goals: To maintain independent mobility; unable to use bilat above knee prosthesis  Patient was seen for face-to-face evaluation for new power wheelchair.  Also present was Fiance - Benjamine Mola and [employer], ATP to discuss recommendations and wheelchair options.  Further paperwork was completed and sent to vendor.  Patient appears to qualify for power mobility device at this time per objective findings.   MEDICAL HISTORY: Diagnosis: Primary Diagnosis: L Above Knee Amputation Onset: 12/11/2018 Diagnosis: Type II or unspecified type diabetes mellitus with neurological manifestations, uncontrolled   _0 Progressive Disease Relevant past and future surgeries: L Great toe ray amputation 12/30/2012; L BKA 02/23/2016;  L AKA 12/24/2017, R 5th ray amputation 12/24/2017, R  AKA 12/11/2018, CABG x 5, insertion of prostate radiation seed, retinal detachment surgery   Height: 6'2" Weight: 260' Explain recent changes or trends in weight: Stable   History including Falls: type 2 DM with peripheral neuropathy, anemia, HTN, Chronic renal disease - stage III, diabetic charcot foot, venous insufficiency, glaucoma, CAD, OSA, LLE cellulitis, malignant neoplasm of prostate, OA     HOME ENVIRONMENT: _0 House  _1 Condo/town home  _2 Apartment  _3 Assisted Living    _4 Lives Alone _5  Lives with Others                                                                                          Hours with caregiver: ?????  _6 Home is accessible to patient           Stairs      _7 Yes _8  No     Ramp _9 Yes _10 No Comments:  medium pile carpeted floors or tile; all doors are 36" wide - bathroom has been renovated to be w/c accessible.  Pt unable to self propel up/down ramp to exit/enter house - requires assistance of fiance.    COMMUNITY ADL: TRANSPORTATION: _11 Car    _12 Van     <GYIRSWNIOEVOJJKK>_9<\/FGHWEXHBZJIRCVEL>_38 Public Transportation    _14 Adapted w/c Lift    _15 Ambulance    _16 Other:       _17 Sits in wheelchair during transport  Employment/School: ????? Specific requirements pertaining to mobility ?????  Other: Futures trader; currently pt uses manual w/c in community but plans on Building surveyor for back of car to transport power wheelchair    FUNCTIONAL/SENSORY PROCESSING SKILLS:  Handedness:   _18 Right     _19 Left    _20 NA  Comments:  ?????  Functional Processing Skills for Wheeled Mobility _21 Processing Skills are adequate for safe wheelchair operation  Areas of concern than may interfere with safe operation of wheelchair Description of problem   _22  Attention to environment      _23 Judgment      _24  Hearing  _25  Vision or visual processing      _26 Motor Planning  _27  Fluctuations in Behavior  ?????    VERBAL COMMUNICATION: _28 WFL receptive _29  WFL expressive _30 Understandable  _31 Difficult to understand  _32 non-communicative _33  Uses an augmented communication device  CURRENT SEATING / MOBILITY: Current Mobility Base:  _34 None _35 Dependent _36 Manual _37 Scooter _38 Power  Type of Control: ?????  Manufacturer:  unknown - pt unable to recall - pt sitting in clinic manual wheelchair for appointment Size:  20 x 18 Age: first manual wheelchair was >24 years old and broken so bought a new manual wheelchair on Ebay >2 years ago; bought a scooter but then tipped it over - sold it  Current Condition of Mobility Base:  Good    Current Wheelchair components:  2 basic foam cushions, removable arm rests, sling back and seat  Describe posture in present seating system:  sacral sitting and L lateral lean, rounded shoulders      SENSATION and SKIN ISSUES: Sensation _39 Intact  _40 Impaired _41 Absent  Level of sensation: Has peripheral neuropathy Pressure Relief: Able to perform effective pressure relief :    _42 Yes  _43  No Method: ???? If not, Why?: unable to  boost with UE due to lack of strength and pain in L shoulder;  unable to effectively perform lateral leans  Skin Issues/Skin Integrity Current Skin Issues  _0 Yes _1 No _2 Intact _3  Red area_4  Open Area  _5 Scar Tissue _6 At risk from prolonged sitting Where  ?????  History of Skin Issues  _7 Yes _8 No Where  ????? When  ?????  Hx of skin flap surgeries  _9 Yes _10 No Where  ????? When  ?????  Limited sitting tolerance _11 Yes _12 No Hours spent sitting in wheelchair daily: 4-5 hours; gets back in bed to get pressure off buttocks  Complaint of Pain:  Please describe: OA - bilat hip pain and L shoulder pain after w/c propulsion - 6 to 8/10 can last into the next day   Swelling/Edema: No   ADL STATUS (in reference to wheelchair use):  Indep Assist Unable Indep with Equip Not assessed Comments  Dressing ????? ????? ????? X ????? Bed level   Eating ????? ????? ????? X ????? seated in wheelchair  Toileting ????? ????? ????? X ????? transfers w/c <> BSC  Bathing ????? ????? ????? X ????? Transfers w/c <> shower chair  Grooming/Hygiene ????? ????? ????? X ????? From wheelchair  Meal Prep ????? ????? ????? X ????? uses wheelchair   IADLS ????? ????? X ????? ????? ?????  Bowel Management: _13 Continent  _14 Incontinent  _15 Accidents Comments:  ?????  Bladder Management: _16 Continent  _17 Incontinent  _18 Accidents Comments:  ?????     WHEELCHAIR SKILLS: Manual w/c Propulsion: _19 UE or LE strength and endurance sufficient to participate in ADLs using manual wheelchair Arm : _20 left _21 right   _22 Both      Distance: 10 meters in 13 seconds = .75 meters per second; HR 102 bpm after 10 meters.  Pain rating 8/10 after propulsion for 10 meters Foot:  _23 left _24 right   _25 Both  Operate Scooter: _26  Strength, hand grip, balance and transfer appropriate for use _27 Living environment is accessible for use of scooter  Operate Power w/c:  _28  Std. Joystick   _29  Alternative Controls Indep _30  Assist _31  Dependent/unable _32  N/A _33   _34 Safe          _35  Functional      Distance:  >1,000 feet  Bed confined without wheelchair _36  Yes _37  No   STRENGTH/RANGE OF MOTION:  AROM Range of Motion Strength  Shoulder WFL RUE: 5/5, LUE: 4-/5 with pain - previous injuries  Elbow WFL WFL  Wrist/Hand [facility] Fall WFL  Hip Hip flexion contracture 2/5  Knee N/A N/A  Ankle N/A N/A     MOBILITY/BALANCE:  _38  Patient is totally dependent for mobility  ?????    Balance Transfers Ambulation  Sitting Balance: Standing Balance: _39  Independent _40  Independent/Modified Independent  _41  WFL     _42  WFL _43  Supervision _44  Supervision  _45  Uses UE for balance  _46  Supervision _47  Min Assist _48  Ambulates with Assist  ?????    _49  Min Assist _50  Min assist _51  Mod Assist _52  Ambulates with Device:      _53  RW  _54  StW  _55  Cane  _56  ?????  _57  Mod Assist _58  Mod assist _59  Max assist   _60  Max Assist _61  Max assist _62  Dependent _63  Indep. Short Distance Only  _64  Unable _65  Unable _66  Lift / Sling Required Distance (in feet)  ?????   _67  Sliding board _68  Unable to Ambulate (see explanation below)  Cardio Status:  _69 Intact  _70  Impaired   _71  NA     CABG x 5; HTN  Respiratory Status:  _72   Intact   _0 Impaired   _1 NA     OSA  Orthotics/Prosthetics: L below knee prosthesis - but now has L AKA  Comments (Address manual vs power w/c vs scooter): [name] has a mobility deficit which cannot be remediated with a cane or walker as he is unable to stand or ambulate due to bilateral transfemoral amputations and is medically unable to utilize bilateral above knee prosthetic devices.  [name] is able to propel a manual wheelchair very short household distances but based on [employer], he is unable to do so efficiently and functionally.  [name] required 13 seconds to propel 10 meters = .75 m/sec, he demonstrated a greater push frequency: 1.07 cycles/sec (requiring a greater number of UE propulsion cycles) over 10 meters resulting in a lower effectiveness of propulsion: .71 m/cycle.   Following the [facility] reported  8/10 pain in his left shoulder and demonstrated a HR of 102 bpm.  With manual propulsion [name] was not able to demonstrate an efficient propulsion sequence: he did not start each cycle with his hands behind the top dead center of the rear wheel and did use a path of the hands beneath the hand rims during the recovery phase.  Utilizing this method of manual propulsion results in increased energy expenditure and places his shoulder joints at greater risk for repetitive use injury.  [name] did trial an Transport planner (POV) prior to his most recent amputation but was unable to maintain his balance and the scooter tipped over while operating it.  Now that [name] has bilateral amputations he demonstrates greater impairments with sitting balance and postural control and would not be able to safely utilize an electric scooter and would not be able to transfer safely onto and off of an electric scooter.  [name] requires a power wheelchair in order to perform his MRADLs within his home and community independently, to be able to safely enter and exit his home in case of an emergency, and improve energy conservation and UE joint conservation.  He also requires the specialty seating components to assist with pain, sitting balance/postural control, independent and effective pressure relief and repositioning.         Anterior / Posterior Obliquity Rotation-Pelvis ?????  PELVIS    _2  _3  _4   Neutral Posterior Anterior  _5  _6  _7   WFL Rt elev Lt elev  _8  _9  _10   WFL Right Left                      Anterior    Anterior     _11  Fixed _12  Other _13  Partly Flexible _14  Flexible   _15  Fixed _16  Other _17  Partly Flexible  _18  Flexible  _19  Fixed _20  Other _21  Partly Flexible  _22  Flexible   TRUNK  _23  _24  _25   WFL ? Thoracic ? Lumbar  Kyphosis Lordosis  _26  _27  _28   WFL Convex Convex  Right Left _29 c-curve _30 s-curve _31 multiple  _32  Neutral _33  Left-anterior _34  Right-anterior     _35  Fixed _36  Flexible _37  Partly  Flexible _38  Other  _39  Fixed _40  Flexible _41  Partly Flexible _42  Other  _43  Fixed             _44  Flexible _45  Partly Flexible _46  Other    Position Windswept  ?????  HIPS          _47            _48               _49   Neutral       Abduct        ADduct         _0           _1            _2   Neutral Right           Left      _3  Fixed _4  Subluxed _5  Partly Flexible _6  Dislocated _7  Flexible  _8  Fixed _9  Other _10  Partly Flexible  _11  Flexible                 Foot Positioning Knee Positioning  N/A    _12  WFL  _13 Lt _14 Rt _15  WFL  _16 Lt _17 Rt    KNEES ROM concerns: ROM concerns:    & Dorsi-Flexed _18 Lt _19 Rt ?????    FEET Plantar Flexed _20 Lt _21 Rt      Inversion                 _22 Lt _23 Rt      Eversion                 _24 Lt _25 Rt     HEAD _26  Functional _27  Good Head Control  ?????  & _28  Flexed         _29  Extended _30  Adequate Head Control    NECK _31  Rotated  Lt  _32  Lat Flexed Lt _33  Rotated  Rt _34  Lat Flexed Rt _35  Limited Head Control     _36  Cervical Hyperextension _37  Absent  Head Control     SHOULDERS ELBOWS WRIST& HAND ?????      Left     Right    Left     Right    Left     Right   U/E _38 Functional           _39 Functional WFL WFL _40 Fisting             _41 Fisting      _42 elev   _43 dep      _44 elev   _45 dep       _46 pro -_47 retract     _48 pro  _49 retract _50 subluxed             _51 subluxed           Goals for Wheelchair Mobility  _52  Independence with mobility in the home with motor related ADLs (MRADLs)  _53  Independence with MRADLs in the community _54  Provide dependent mobility  _55  Provide recline     _56 Provide tilt   Goals for Seating system _57  Optimize pressure distribution _58  Provide support needed to facilitate function or safety _59  Provide corrective forces to assist with maintaining or improving posture _60  Accommodate client's posture:   current seated postures and positions are not flexible or will not tolerate corrective forces _61  Client to be independent with relieving pressure in  the wheelchair _62 Enhance physiological function such as breathing, swallowing, digestion  Simulation ideas/Equipment trials:Wheelchair Propulsion Test 1.  Speed: 33m13.2 no. of seconds = .75 m/sec 2.  Push frequency (cadence): 14 no. of cycles/13 no. of seconds = 1.07 cycles/s 3.  Effectiveness: 161m4 no. of cycles = .71 m/cycle State why other equipment was unsuccessful:Tipped over electric scooter; not able to propel manual wheelchair effectively   MOBILITY BASE RECOMMENDATIONS and JUSTIFICATION: MOBILITY COMPONENT JUSTIFICATION  Manufacturer: [employer] Model: J6   Size: Width 19Seat Depth 20 _63 provide transport from point A to B      _64 promote Indep mobility  _65 is not a safe, functional ambulator _66 walker or  cane inadequate _0 non-standard width/depth necessary to accommodate anatomical measurement _1  ?????  _2 Manual Mobility Base _3 non-functional ambulator    _4 Scooter/POV  _5 can safely operate  _6 can safely transfer   _7 has adequate trunk stability  _8 cannot functionally propel manual w/c  _9 Power Mobility Base  _10 non-ambulatory  _11 cannot functionally propel manual wheelchair  _12  cannot functionally and safely operate scooter/POV _13 can safely operate and willing to  _14 Stroller Base _15 infant/child  _16 unable to propel manual wheelchair _17 allows for growth _18 non-functional ambulator _19 non-functional UE _20 Indep mobility is not a goal at this time  _21 Tilt  _22 Forward _23 Backward _24 Powered tilt  _25 Manual tilt  _26 change position against gravitational force on head and shoulders  _27 change position for pressure relief/cannot weight shift _28 transfers  _29 management of tone _30 rest periods _31 control edema _32 facilitate postural control  _33  ?????  _34 Recline  _35 Power recline on power base _36 Manual recline on manual base  _37 accommodate femur to back angle  _38 bring to full recline for ADL care  _39 change position for pressure relief/cannot weight shift _40 rest  periods _41 repositioning for transfers or clothing/diaper /catheter changes _42 head positioning  _43 Lighter weight required _44 self- propulsion  _45 lifting _46  ?????  _47 Heavy Duty required _48 user weight greater than 250# _49 extreme tone/ over active movement _50 broken frame on previous chair _51  ?????  _52  Back  _53  Angle Adjustable _54  Custom molded Sport back _55 postural control _56 control of tone/spasticity _57 accommodation of range of motion _58 UE functional control _59 accommodation for seating system _60  ????? _61 provide lateral trunk support _62 accommodate deformity _63 provide posterior trunk support _64 provide lumbar/sacral support _65 support trunk in midline _66 Pressure relief over spinal processes  _67  Government social research officer _68 impaired sensation  _69 decubitus ulcers present _70 history of pressure ulceration _71 prevent pelvic extension _72 low maintenance  _73 stabilize pelvis  _74 accommodate obliquity _75 accommodate multiple deformity _76 neutralize lower extremity position _77 increase pressure distribution _78  ?????  _79  Pelvic/thigh support  _80  Lateral thigh guide _81  Distal medial pad  _82  Distal lateral pad _83  pelvis in neutral _84 accommodate pelvis _85  position upper legs _86  alignment _87  accommodate ROM _88  decr adduction _89 accommodate tone _90 removable for transfers _91 decr abduction  _92  Lateral trunk Supports _93  Lt     _94  Rt _95 decrease lateral trunk leaning _96 control tone _97 contour for increased contact _98 safety  _99 accommodate asymmetry _100  ?????  _101  Mounting hardware  _102 lateral trunk supports  _103 back   _104 seat _105 headrest      _106  thigh support _107 fixed   _108 swing away _109 attach seat platform/cushion to w/c frame _110 attach back cushion to w/c frame _111 mount postural supports _112 mount headrest  _113 swing medial thigh support away _114 swing lateral supports away for transfers  _115  ?????    Armrests  _116 fixed _117 adjustable height _118 removable   _119 swing away  _120 flip  back   _121 reclining _122 full length pads _123 desk    _124 pads tubular  _125 provide support with elbow at 90   _126 provide support for w/c tray _127 change of height/angles for variable activities _128 remove for transfers _129 allow to come closer to table top _130 remove for access to tables _131  ?????  Hangers/ Leg rests  _132 60 _133 70 _134 90 _135 elevating _136 heavy duty  _137 articulating _138 fixed _139 lift off _140 swing away     _141 power _142 provide LE support  _143 accommodate to hamstring tightness _144 elevate legs during recline   _145 provide change in position for Legs _146 Maintain placement of feet on footplate _147 durability _148 enable transfers _149 decrease edema _150 Accommodate lower leg length _151  ?????  Foot support Footplate    <[id number]>_1<\/HYQMVHQIONGEXBMW>_413 Lt  _153  Rt  _154  Center mount _155 flip up     _156 depth/angle adjustable _157 Amputee adapter    _158  Lt     _159   Rt _0 provide foot support _1 accommodate to ankle ROM _2 transfers _3 Provide support for residual extremity _4  allow foot to go under wheelchair base _5  decrease tone  _6  ?????  _7  Ankle strap/heel loops _8 support foot on foot support _9 decrease extraneous movement _10 provide input to heel  _11 protect foot  Tires: _12 pneumatic  _13 flat free inserts  _14 solid  _15 decrease maintenance  _16 prevent frequent flats _17 increase shock absorbency _18 decrease pain from road shock _19 decrease spasms from road shock _20  ?????  _21  Headrest  _22 provide posterior head support _23 provide posterior neck support _24 provide lateral head support _25 provide anterior head support _26 support during tilt and recline _27 improve feeding   _28 improve respiration _29 placement of switches _30 safety  _31 accommodate ROM  _32 accommodate tone _33 improve visual orientation  _34  Anterior chest strap _35  Vest _36  Shoulder retractors  _37 decrease forward movement of shoulder _38 accommodation of TLSO _39 decrease forward movement of trunk _40 decrease shoulder elevation _41 added abdominal support _42 alignment _43 assistance with shoulder control  _44   ?????  Pelvic Positioner _45 Belt _46 SubASIS bar _47 Dual Pull _48 stabilize tone _49 decrease falling out of chair/ **will not Decr potential for sliding due to pelvic tilting _50 prevent excessive rotation _51 pad for protection over boney prominence _52 prominence comfort _53 special pull angle to control rotation _54  ?????  Upper Extremity Support _55 L   _56  R _57 Arm trough    _58 hand support _59  tray       _60 full tray _61 swivel mount _62 decrease edema      _63 decrease subluxation   _64 control tone   _65 placement for AAC/Computer/EADL _66 decrease gravitational pull on shoulders _67 provide midline positioning _68 provide support to increase UE function _69 provide hand support in natural position _70 provide work surface   POWER WHEELCHAIR CONTROLS  _71 Proportional  _72 Non-Proportional Type Joystick _73 Left  _74 Right _75 provides access for controlling wheelchair   _76 lacks motor control to operate proportional drive control <KGMWNUUVOZDGUYQI>_3<\/KVQQVZDGLOVFIEPP>_29 unable to understand proportional controls  Actuator Control Module  _78 Single  _79 Multiple   _80 Allow the client to operate the power seat function(s) through the joystick control   _81 Safety Reset Switches _82 Used to change modes and stop the wheelchair when driving in latch mode    _83 [employer]   _84 programming for accurate control _85 progressive Disease/changing condition _86 non-proportional drive control needed _87 Needed in order to operate power seat functions through joystick control   _88 Display box _89 Allows user to see in which mode and drive the wheelchair is set  _90 necessary for alternate controls    _91 Digital interface electronics _92 Allows w/c to operate when using alternative drive controls  <JJOACZYSAYTKZSWF>_0<\/XNATFTDDUKGURKYH>_06 ASL Head Array _94 Allows client to operate wheelchair  through switches placed in tri-panel headrest  _95 Sip and puff with tubing kit _96 needed to operate sip and puff drive controls  <CBJSEGBTDVVOHYWV>_3<\/XTGGYIRSWNIOEVOJ>_50 Upgraded tracking electronics _98 increase safety when driving <KXFGHWEXHBZJIRCV>_8<\/LFYBOFBPZWCHENID>_78 correct tracking when on uneven  surfaces  _100 [facility] for switches or joystick _101 Attaches switches to w/c  _102 Swing away for access or transfers _103 midline for optimal placement _104 provides for consistent access  _105 Attendant controlled joystick plus mount _106 safety _107 long distance driving <EUMPNTIRWERXVQMG>_8<\/QPYPPJKDTOIZTIWP>_809 operation of seat functions _109 compliance with transportation regulations _110  ?????    Rear wheel placement/Axle adjustability _111 None _112 semi adjustable _113 fully adjustable  _114 improved UE access to wheels _115 improved stability _116 changing angle in space for improvement of postural stability _117 1-arm drive access <XIPJASNKNLZJQBHA>_1<\/PFXTKWIOXBDZHGDJ>_242 amputee pad placement _119  ?????  Wheel rims/ hand rims  _120 metal  _121 plastic coated _122 oblique projections _123 vertical projections _124 Provide ability to propel manual wheelchair  _125  Increase self-propulsion with hand weakness/decreased grasp  Push handles _126 extended  _127 angle adjustable  _128 standard _129 caregiver access _130 caregiver assist _131 allows "hooking" to enable increased ability to perform ADLs or maintain balance  One armed  device  _0 Lt   _1 Rt _2 enable propulsion of manual wheelchair with one arm   _3  ?????   Brake/wheel lock extension _4  Lt   _5  Rt _6 increase indep in applying wheel locks   _7 Side guards _8 prevent clothing getting caught in wheel or becoming soiled _9  prevent skin tears/abrasions  Battery: U1 x 2 _10 to power wheelchair ?????  Other: ????? ????? ?????  The above equipment has a life- long use expectancy. Growth and changes in medical and/or functional conditions would be the exceptions. This is to certify that the therapist has no financial relationship with durable medical provider or manufacturer. The therapist will not receive remuneration of any kind for the equipment recommended in this evaluation.   Patient has mobility limitation that significantly impairs safe, timely participation in one or more mobility related ADL's.  (bathing, toileting, feeding, dressing, grooming, moving from room to room)                                                              _11  Yes _12  No Will mobility device sufficiently improve ability to participate and/or be aided in participation of MRADL's?         _13  Yes _14  No Can limitation be compensated for with use of a cane or walker?                                                                                _15  Yes _16  No Does patient or caregiver demonstrate ability/potential ability & willingness to safely use the mobility device?   _17  Yes _18  No Does patient's home environment support use of recommended mobility device?                                                    _19  Yes _20  No Does patient have sufficient upper extremity function necessary to functionally propel a manual wheelchair?    _21  Yes _22  No Does patient have sufficient strength and trunk stability to safely operate a POV (scooter)?                                  _23  Yes _24  No Does patient need additional features/benefits provided by a power wheelchair for MRADL's in the home?       _25  Yes _26  No Does the patient demonstrate the ability to safely use a power wheelchair?                                                              _27  Yes _28  No  Therapist  Name Printed: Rico Junker, PT, DPT Date: 01/21/2019  Therapist's Signature:   Date:   Supplier's Name Printed: [employer] Date: 01/21/2019  Supplier's Signature:   Date:  Patient/Caregiver Signature:   Date:     This is to certify that I have read this evaluation and do agree with the content within:      Physician's Name Printed: Izora Ribas, MD  63 Signature:  Date:     This is to certify that I, the above signed therapist have the following affiliations: _0  This DME provider _1  Manufacturer of recommended equipment _2  Patient's long term care facility _3  None of the above              Objective measurements completed on examination: See above findings.              PT Education -  01/24/19 1437    Education Details  Process of obtaining power wheelchair    Person(s) Educated  Patient;Spouse    Methods  Explanation    Comprehension  Verbalized understanding                  Plan - 01/24/19 1438    Clinical Impression Statement  Pt is a 69 year old male referred to Neuro OPPT for evaluation for power mobility due to bilat AKA.  Pt's PMH is significant for the following: type 2 DM with peripheral neuropathy, anemia, HTN, Chronic renal disease - stage III, diabetic charcot foot, venous insufficiency, glaucoma, CAD, OSA, LLE cellulitis, malignant neoplasm of prostate, OA, L Great toe ray amputation 12/30/2012; L BKA 02/23/2016;  L AKA 12/24/2017, R 5th ray amputation 12/24/2017, R AKA 12/11/2018, CABG x 5, insertion of prostate radiation seed, retinal detachment surgery. The following deficits were noted during pt's exam: impaired UE ROM and strength, impaired LE ROM, pain in L shoulder pain and bilat hip pain due to OA, impaired posture, impaired sitting balance and inability to stand or ambulate.  Pt would benefit from power mobility with power tilt and positioning cushion to maximize pressure relief, positioning, UE joint preservation, energy expenditure, functional mobility independence and reduce falls risk.    Personal Factors and Comorbidities  Comorbidity 3+;Fitness;Past/Current Experience    Comorbidities  type 2 DM with peripheral neuropathy, anemia, HTN, Chronic renal disease - stage III, diabetic charcot foot, venous insufficiency, glaucoma, CAD, OSA, LLE cellulitis, malignant neoplasm of prostate, OA, L Great toe ray amputation 12/30/2012; L BKA 02/23/2016;  L AKA 12/24/2017, R 5th ray amputation 12/24/2017, R AKA 12/11/2018, CABG x 5, insertion of prostate radiation seed, retinal detachment surgery    Examination-Activity Limitations  Bathing;Dressing;Hygiene/Grooming;Locomotion Level;Toileting;Transfers    Examination-Participation Restrictions  Community  Activity;Meal Prep;Shop    Stability/Clinical Decision Making  Evolving/Moderate complexity    Clinical Decision Making  Moderate    Rehab Potential  Good    PT Frequency  One time visit   one time visit for wheelchair evaluation   PT Duration  Other (comment)   one time visit for wheelchair evaluation   PT Treatment/Interventions  Other (comment)   wheelchair management   Consulted and Agree with Plan of Care  Patient;Family member/caregiver    Family Member Consulted  significant other       Patient will benefit from skilled therapeutic intervention in order to improve the following deficits and impairments:  Decreased balance, Decreased activity tolerance, Decreased endurance, Decreased mobility, Decreased range of motion, Decreased strength, Pain, Postural dysfunction  Visit Diagnosis: Muscle weakness (generalized)  Stiffness of left hip, not elsewhere classified  Stiffness of right hip, not elsewhere classified  Chronic left shoulder pain  Other symptoms and signs involving the musculoskeletal system     Problem List Patient Active Problem List   Diagnosis Date Noted  . Above knee amputation of right lower extremity ([place]) 12/14/2018  . Atherosclerosis of native artery of right leg with gangrene ([place]) 12/11/2018  . Hypoalbuminemia due to protein-calorie malnutrition ([place])   . Type 2 diabetes mellitus with peripheral neuropathy ([facility])   . Left above-knee amputee ([place]) 12/26/2017  . Unilateral AKA, left ([place])   . History of complete ray amputation of fifth toe of right foot ([place])   . Postoperative pain   . Acute blood loss anemia   . Essential hypertension   . Atherosclerosis of native arteries of extremities with gangrene, right leg ([place]) 02/19/2017  . Chronic renal disease, stage III 02/19/2017  . Ulcer of toe of right foot, limited to breakdown of skin ([place]) 10/11/2016  . Type 2 diabetes mellitus with diabetic polyneuropathy, with long-term current use of insulin  ([place]) 03/29/2015  . Advance directive discussed with patient 02/07/2014  . Leucocytosis 04/01/2013  . Normocytic anemia 04/01/2013  . Diabetic Charcot foot ([place]) 04/01/2013  . Routine general medical examination at a health care facility 12/25/2010  . PERSONAL HX COLONIC POLYPS 11/22/2009  . Venous (peripheral) insufficiency 06/29/2008  . Osteoarthritis, multiple sites 04/03/2007  . ERECTILE DYSFUNCTION, ORGANIC 06/13/2006  . Hyperlipemia 06/11/2006  . GLAUCOMA 06/11/2006  . Coronary atherosclerosis of native coronary artery 06/11/2006  . Obstructive sleep apnea 06/11/2006    Rico Junker, PT, DPT 01/24/19    2:57 PM    [facility] [address] [place], [place], [address] Phone: [phone]   Fax:  [phone]  Name: KEASTON PILE MRN: [id number] Date of Birth: April 09, 1950

## 2019-01-28 ENCOUNTER — Encounter: Payer: Self-pay | Admitting: Internal Medicine

## 2019-01-28 ENCOUNTER — Ambulatory Visit (INDEPENDENT_AMBULATORY_CARE_PROVIDER_SITE_OTHER): Payer: Medicare Other | Admitting: Internal Medicine

## 2019-01-28 ENCOUNTER — Other Ambulatory Visit: Payer: Self-pay

## 2019-01-28 VITALS — BP 120/60 | HR 68

## 2019-01-28 DIAGNOSIS — E669 Obesity, unspecified: Secondary | ICD-10-CM

## 2019-01-28 DIAGNOSIS — Z794 Long term (current) use of insulin: Secondary | ICD-10-CM | POA: Diagnosis not present

## 2019-01-28 DIAGNOSIS — E782 Mixed hyperlipidemia: Secondary | ICD-10-CM

## 2019-01-28 DIAGNOSIS — E1142 Type 2 diabetes mellitus with diabetic polyneuropathy: Secondary | ICD-10-CM | POA: Diagnosis not present

## 2019-01-28 MED ORDER — METFORMIN HCL 1000 MG PO TABS: 2000.0000 mg | ORAL_TABLET | Freq: Every evening | ORAL | 3 refills | Status: DC

## 2019-01-28 NOTE — Progress Notes (Signed)
Patient ID: [name], male   DOB: Jun 30, 1950, 69 y.o.   MRN: [id number]  This visit occurred during the SARS-CoV-2 public health emergency.  Safety protocols were in place, including screening questions prior to the visit, additional usage of staff PPE, and extensive cleaning of exam room while observing appropriate contact time as indicated for disinfecting solutions.   HPI: [name] is a 69 y.o.-year-old male, returning for f/u for DM2, dx 2008, insulin-dependent since dx, uncontrolled, with complications (peripheral neuropathy, CAD-status post CABG in 2008, PVD, Charcot foot, history of diabetic foot ulcer, AKA bilaterally). 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 Also Part D - [employer].   He had L AKA 12/2017, R 5th ray amputation 12/2017.  Since last visit, he had right AKA 12/11/2018. Before this, he had infection and gangrene in his R leg >> sugars higher x 2 mo. Now improved after his surgery.  Reviewed HbA1c levels: Lab Results  Component Value Date   HGBA1C 8.6 (H) 12/11/2018   HGBA1C 6.4 (A) 12/12/2017   HGBA1C 7.3 (A) 08/05/2017   Pt is on a regimen of: - Metformin 1000 mg at bedtime - Toujeo 60 >> 56 units at bedtime - Humalog 25-30 >> Novolog  16-26 >> 25-30 units before meals  Pt checks his sugars 1-3 times a day: - am: 150-190 >> 87, 130-150 >> 110-140 > 132-190 - before lunch: 69, 104-154, 207 >> n/c >> 56 >> n/c - before dinner: 130-160 >> 120-150 >> 94-120s - bedtime:  96-160 >> n/c >> 110-130 >> n/c Lowest: 56 x1 (lunchtime) ... >> 80s >> 100 >> 94. Hypogly awareness at 100.  Highest sugar: 200 >> 180 (Tx-giving) >> 190 >> 290.  Patient meals are: - Breakfast: bacon, eggs, tomato sandwich (sometimes no bread) >> coffee + fruit - Lunch: PB crackers >> baked fish or chicken + veggies - Dinner: meat + 2 veggies  >> same as lunch - Snacks: 1 a day - 2x a week, icecream  >> fruit Uses Splenda in tea and  coffee.  -+ CKD, last BUN/creatinine:  Lab Results  Component Value Date   BUN 22 12/15/2018   CREATININE 0.87 12/15/2018  On losartan. -+ HL; last set of lipids: Lab Results  Component Value Date   CHOL 107 02/19/2017   HDL 26.40 (L) 02/19/2017   LDLCALC 52 02/19/2017   LDLDIRECT 72.0 02/13/2015   TRIG 143.0 02/19/2017   CHOLHDL 4 02/19/2017  He is statin intolerant. - last eye exam was 2020: No DR.  Dr. Claudean Kinds.  He had a detached retina in 03/2015 >> had Laser sx (his 3rd).  He had right cataract surgery 06/2015.  He had amputation of his big toe in 12/30/2012 2/2 infection >> osteomyelitis. He continued to have problems with ulcers on his left foot, in the setting of Charcot joint. He had a L BKA (02/23/2016) b/c he had a long h/o ulcer on his Charcot foot >> developed osteomyelitis.  Now prosthesis is off due to his ulcers.  Since last visit, he also had  L AKA and R 5th ray amputation in 12/2017.   He also has a history of prostate cancer , s/p 25 RxTx, then radioactive seeds.  He also has HTN, OSA, only.  ROS: Constitutional: no weight gain/no weight loss, no fatigue, no subjective hyperthermia, no subjective hypothermia Eyes: no blurry vision, no xerophthalmia ENT: no sore throat, no nodules palpated in neck, no dysphagia,  no odynophagia, no hoarseness Cardiovascular: no CP/no SOB/no palpitations/no leg swelling Respiratory: no cough/no SOB/no wheezing Gastrointestinal: no N/no V/no D/no C/no acid reflux Musculoskeletal: no muscle aches/no joint aches Skin: no rashes, no hair loss Neurological: no tremors/+ numbness/no tingling/no dizziness  I reviewed pt's medications, allergies, PMH, social hx, family hx, and changes were documented in the history of present illness. Otherwise, unchanged from my initial visit note.   Past Medical History:  Diagnosis Date  . Cellulitis and abscess of left lower extremity 12/20/2017  . Coronary atherosclerosis of unspecified type  of vessel, native or graft   . Diabetes mellitus without complication ([place])    type 2  . Hemorrhage of rectum and anus   . History of kidney stones    passed stones  . Impotence of organic origin   . Internal hemorrhoids without mention of complication   . Left below-knee amputee ([place]) 02/23/2016  . Malignant neoplasm of prostate ([place])   . Mononeuritis of unspecified site   . Osteoarthrosis, unspecified whether generalized or localized, unspecified site   . Other and unspecified hyperlipidemia   . Peripheral vascular disease ([place])   . Personal history of colonic polyps   . Personal history of diabetic foot ulcer    saw wound center, resolved 05/08/2010  . Personal history of gallstones   . Routine general medical examination at a health care facility   . Subacute osteomyelitis, right ankle and foot ([place])   . Type II or unspecified type diabetes mellitus with neurological manifestations, not stated as uncontrolled(250.60)   . Unspecified glaucoma(365.9)   . Unspecified sleep apnea    cpcp  . Unspecified venous (peripheral) insufficiency    Past Surgical History:  Procedure Laterality Date  . AMPUTATION Left 12/30/2012   Procedure: AMPUTATION RAY ;  Surgeon: Meredith Pel, MD;  Location: [facility];  Service: Orthopedics;  Laterality: Left;  LEFT GREAT TOE RAY AMPUTATION  . AMPUTATION Left 02/23/2016   Procedure: Left Below Knee Amputation;  Surgeon: [name], MD;  Location: [facility];  Service: Orthopedics;  Laterality: Left;  . AMPUTATION Left 12/24/2017   Procedure: LEFT ABOVE KNEE AMPUTATION;  Surgeon: [name], MD;  Location: [facility];  Service: Orthopedics;  Laterality: Left;  . AMPUTATION Right 12/24/2017   Procedure: RIGHT 5TH RAY AMPUTATION;  Surgeon: [name], MD;  Location: [facility];  Service: Orthopedics;  Laterality: Right;  . AMPUTATION Right 12/11/2018   Procedure: RIGHT ABOVE KNEE AMPUTATION;  Surgeon: [name], MD;  Location: [facility];  Service: Orthopedics;   Laterality: Right;  . APPENDECTOMY    . BASAL CELL CARCINOMA EXCISION  2/16   left forearm  . BELPHAROPTOSIS REPAIR    . CARDIAC CATHETERIZATION  1998   Negative  . CATARACT EXTRACTION Right 2017   then lid surgery  . COLONOSCOPY    . CORONARY ARTERY BYPASS GRAFT  09/2005   5 bypasses per patient, Post op AFIB  . EYE SURGERY    . FOOT BONE EXCISION Left 11/03/2013   DR DUDA   . I & D EXTREMITY Left 11/03/2013   Procedure: Left Foot Partial Bone Excision Cuboid and Medial Cuneiform, Wound Closures;  Surgeon: [name], MD;  Location: [facility];  Service: Orthopedics;  Laterality: Left;  . INSERTION PROSTATE RADIATION SEED  2009   RT and seeds for prostate cancer  . KIDNEY STONE SURGERY  04/1993  . RCA stents  04/2003   EF 55%  . RETINAL DETACHMENT  SURGERY  2002-2003  . thrombosed vein  1993   Right leg   Social History   Socioeconomic History  . Marital status: Widowed    Spouse name: Not on file  . Number of children: 3  . Years of education: Not on file  . Highest education level: Not on file  Occupational History  . Occupation: Heavy equipment mechanic--retired  Tobacco Use  . Smoking status: Former Smoker    Packs/day: 2.00    Years: 35.00    Pack years: 70.00    Types: Cigars, Cigarettes    Quit date: 01/07/2001    Years since quitting: 18.0  . Smokeless tobacco: Never Used  Substance and Sexual Activity  . Alcohol use: Not Currently    Comment: rare  . Drug use: No  . Sexual activity: Yes    Partners: Female  Other Topics Concern  . Not on file  Social History Narrative   Has living will   Daughter [name] is [facility] health care POA    Would accept resuscitation attempts--but no prolonged artificial ventilation   No tube feeds if cognitively unaware   Social Determinants of Health   Financial Resource Strain:   . Difficulty of Paying Living Expenses: Not on file  Food Insecurity:   . Worried About Charity fundraiser in the Last Year: Not on file  .  Ran Out of Food in the Last Year: Not on file  Transportation Needs:   . Lack of Transportation (Medical): Not on file  . Lack of Transportation (Non-Medical): Not on file  Physical Activity:   . Days of Exercise per Week: Not on file  . Minutes of Exercise per Session: Not on file  Stress:   . Feeling of Stress : Not on file  Social Connections:   . Frequency of Communication with Friends and Family: Not on file  . Frequency of Social Gatherings with Friends and Family: Not on file  . Attends Religious Services: Not on file  . Active Member of Clubs or Organizations: Not on file  . Attends Archivist Meetings: Not on file  . Marital Status: Not on file  Intimate Partner Violence:   . Fear of Current or Ex-Partner: Not on file  . Emotionally Abused: Not on file  . Physically Abused: Not on file  . Sexually Abused: Not on file   Current Outpatient Medications on File Prior to Visit  Medication Sig Dispense Refill  . ACCU-CHEK GUIDE test strip USE AS INSTRUCTED TO CHECK SUGAR 3 TIMES DAILY DX- E11.42 200 strip 8  . Ascorbic Acid (VITAMIN C) 1000 MG tablet Take 1,000 mg by mouth daily.    Marland Kitchen aspirin 325 MG tablet Take 325 mg by mouth daily.      . carvedilol (COREG) 25 MG tablet TAKE 1 TABLET TWICE DAILY 180 tablet 3  . Cholecalciferol (VITAMIN D3) 125 MCG (5000 UT) TABS Take 5,000 Units by mouth daily.    Marland Kitchen doxycycline (VIBRA-TABS) 100 MG tablet Take 1 tablet (100 mg total) by mouth 2 (two) times daily. 60 tablet 0  . DROPLET PEN NEEDLES 31G X 5 MM MISC USE TO INJECT INSULIN FOUR TIMES DAILY AS INSTRUCTED (FOR DIABETES) 400 each 1  . insulin aspart (NOVOLOG FLEXPEN) 100 UNIT/ML FlexPen INJECT 16-26 UNITS SUBCUTANEOUSLY THREE TIMES DAILY WITH MEALS (Patient taking differently: Inject 25-30 Units into the skin 3 (three) times daily with meals. Sliding Scale Insulin) 20 pen 3  . Insulin Glargine, 2 Unit Dial, (  TOUJEO MAX SOLOSTAR) 300 UNIT/ML SOPN Inject 55 Units into the skin at  bedtime. (Patient taking differently: Inject 60 Units into the skin at bedtime. ) 9 pen 3  . losartan-hydrochlorothiazide (HYZAAR) 50-12.5 MG tablet TAKE 1 TABLET EVERY DAY 90 tablet 3  . metFORMIN (GLUCOPHAGE) 1000 MG tablet TAKE 1 TABLET EVERY DAY (Patient taking differently: Take 1,000 mg by mouth every evening. ) 90 tablet 2  . methocarbamol (ROBAXIN) 500 MG tablet Take 1 tablet (500 mg total) by mouth every 6 (six) hours as needed for muscle spasms. 60 tablet 0  . Multiple Vitamin (MULTIVITAMIN WITH MINERALS) TABS tablet Take 1 tablet by mouth daily. Centrum Silver for Men 50+    . Oxycodone HCl 10 MG TABS Take 0.5-1 tablets (5-10 mg total) by mouth every 8 (eight) hours as needed. 24 tablet 0  . potassium citrate (UROCIT-K) 10 MEQ (1080 MG) SR tablet Take 20 mEq by mouth 2 (two) times daily.    . Probiotic Product (PROBIOTIC PO) Take 1 capsule by mouth daily. Probulin Probiotic Supplement    . zinc gluconate 50 MG tablet Take 50 mg by mouth 2 (two) times daily.      No current facility-administered medications on file prior to visit.   Allergies  Allergen Reactions  . Atorvastatin Other (See Comments)    myalgias  . Ezetimibe Other (See Comments)    Body ache  . Simvastatin Other (See Comments)    Body ache   Family History  Problem Relation Age of Onset  . Lung cancer Father   . Multiple sclerosis Mother   . Heart attack Other        paternal aunts and uncles  . Peripheral vascular disease Maternal Grandfather        several amputations    PE: BP 120/60   Pulse 68   SpO2 98%  There is no height or weight on file to calculate BMI.  We could not weigh him today b/s in wheelchair, cannot stand.  Wt Readings from Last 3 Encounters:  01/21/19 260 lb (117.9 kg)  01/07/19 260 lb (117.9 kg)  01/04/19 260 lb (117.9 kg)   Constitutional: overweight, in NAD Eyes: PERRLA, EOMI, no exophthalmos ENT: moist mucous membranes, no thyromegaly, no cervical  lymphadenopathy Cardiovascular: RRR, No MRG Respiratory: CTA B Gastrointestinal: abdomen soft, NT, ND, BS+ Musculoskeletal: no deformities, strength intact in all 4 Skin: moist, warm, no rashes Neurological: no tremor with outstretched hands, DTR normal in all 4  ASSESSMENT: 1. DM2, insulin-dependent, uncontrolled, with complications - peripheral neuropathy - CAD-status post stent in 2005, then 5v CABG in 2008 - PVD - history of diabetic foot ulcer - Charcot foot - L - h/o amputation of his big toe in 12/2012 2/2 osteomyelitis and L BKA 01/2016; L AKA 12/2017, R 5th ray amputation 12/2017 - history of retinal detachment in 2002-3  2. Obesity class 2 BMI Classification:  < 18.5 underweight   18.5-24.9 normal weight   25.0-29.9 overweight   30.0-34.9 class I obesity   35.0-39.9 class II obesity   ? 40.0 class III obesity   3. HL  PLAN:  1. Patient with uncontrolled type 2 diabetes, on basal/bolus insulin regimen and Metformin.  His sugars became better controlled after he started to eat a more plant-based diet and his HbA1c decreased to 6.4%.  However, he relaxed his diet afterwards and sugars increased especially in the last few months. -At last visit, sugars were at goal or close to goal, without major  fluctuations and we did not change his regimen. -We reviewed together his most recent HbA1c from last month: 8.6%, higher -At this visit, he tells me that his sugars were higher before his amputation due to infection and necrosis.  They improved after the surgery, but they are still quite high in the morning, above goal.  Before dinner, they are at goal.  He is not checking sugars after dinner to know whether his mealtime NovoLog before this meal is adequate.  I advised him to start doing so.  For now, since his kidney function is normal, I advised him to increase the dose of Metformin to improve his sugars in the morning. - I advised him to:  Patient Instructions  Please  continue: - Toujeo 56 units at bedtime - NovoLog 25-30 units before meals  Please increase: - Metformin 2000 mg and move it with dinner  Please check some sugars after dinner.  Please return in 4 months with your sugar log.   - advised to check sugars at different times of the day - 3x a day, rotating check times - advised for yearly eye exams >> he is UTD - return to clinic in 4 months   2. Obesity class 2 -He initially lost 20 pounds on a more plant-based diet but then gained some of the weight back -However, since last in person visit, he actually lost 30 pounds (in 1 year), but some of it is due to his AKA  3. HL -Reviewed latest lipid panel from 2019: LDL at goal, HDL low, triglycerides also at goal Lab Results  Component Value Date   CHOL 107 02/19/2017   HDL 26.40 (L) 02/19/2017   LDLCALC 52 02/19/2017   LDLDIRECT 72.0 02/13/2015   TRIG 143.0 02/19/2017   CHOLHDL 4 02/19/2017  -He is intolerant to statins - he will have this done at next visit with PCP net month  Philemon Kingdom, MD PhD [facility] Endocrinology

## 2019-01-28 NOTE — Patient Instructions (Addendum)
Please continue: - Toujeo 56 units at bedtime - NovoLog 25-30 units before meals  Please increase: - Metformin 2000 mg and move it with dinner  Please check some sugars after dinner.  Please return in 4 months with your sugar log.

## 2019-02-02 IMAGING — MR MR FOOT*R* WO/W CM
5 of 10 series · 22 of 40 positions shown · IV contrast (gadavist)
Comparison: Right foot x-rays from yesterday.

CLINICAL DATA: Right lateral foot wound near the fifth MTP joint.
Evaluate for osteomyelitis.

EXAM:
MRI OF THE RIGHT FOREFOOT WITHOUT AND WITH CONTRAST
TECHNIQUE: Multiplanar, multisequence MR imaging of the right forefoot was
performed before and after the administration of intravenous
contrast.
CONTRAST:  10 mL Gadavist intravenous contrast.

[Series 5: T2 fat-sat · oblique · 3.0mm · 0.62mm/px · 4 of 33 slices shown]
[im 1/33]
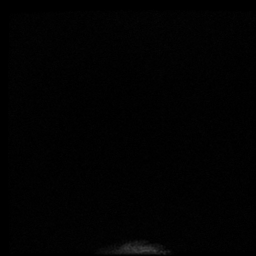
[im 11/33]
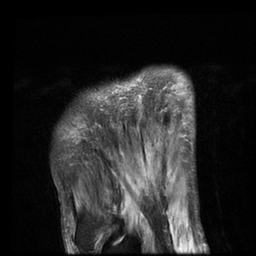
[im 22/33]
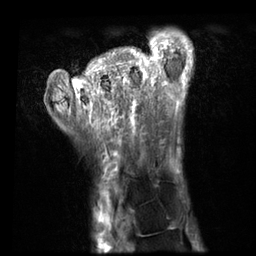
[im 33/33]
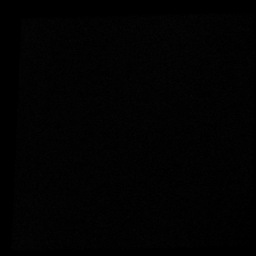

[Series 7: T1 fat-sat · oblique · non-contrast · 3.0mm · 0.31mm/px · 4 of 33 slices shown (1 of 2)]
[im 1/33]
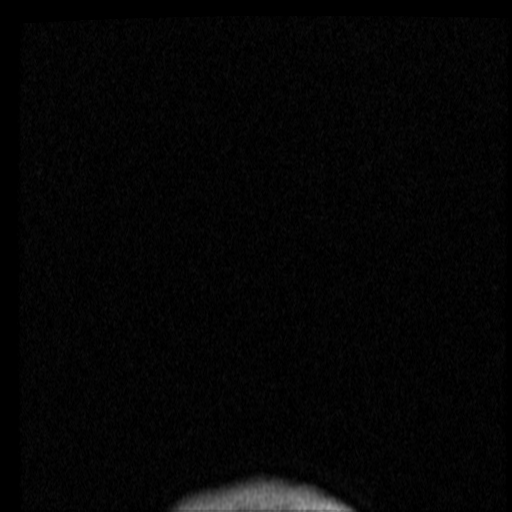
[im 11/33]
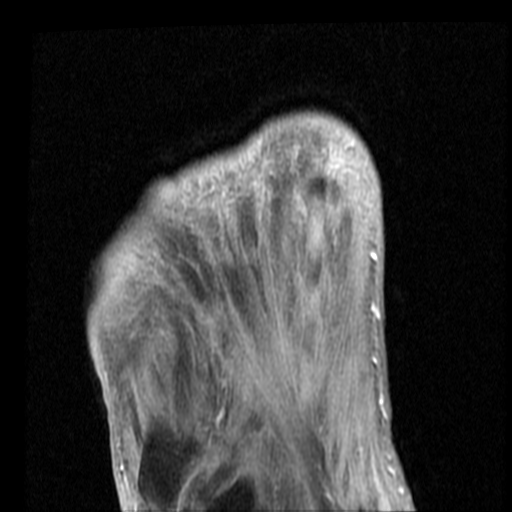
[im 22/33]
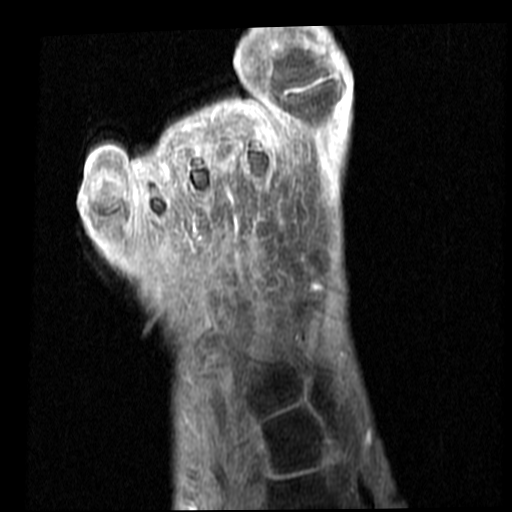
[im 33/33]
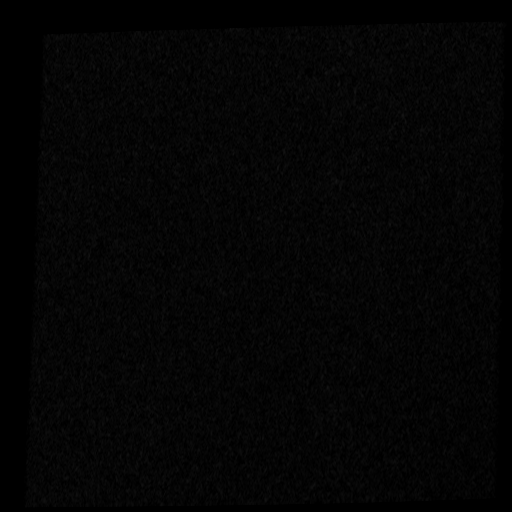

[Series 10: T1 · oblique · 3.0mm · 0.31mm/px · 4 of 33 slices shown (1 of 2)]
[im 1/33]
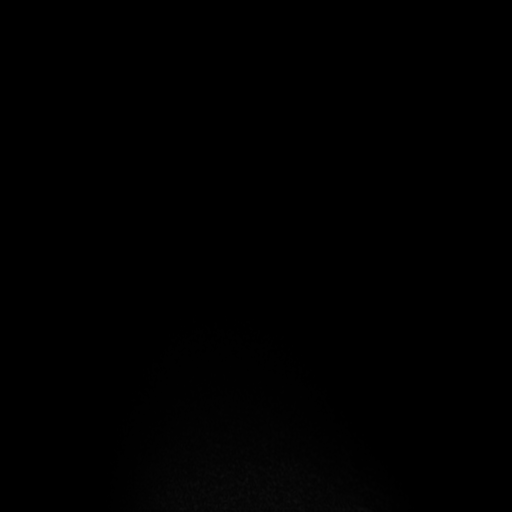
[im 11/33]
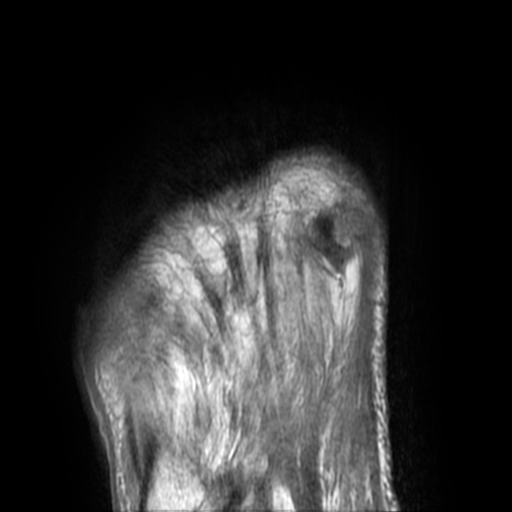
[im 22/33]
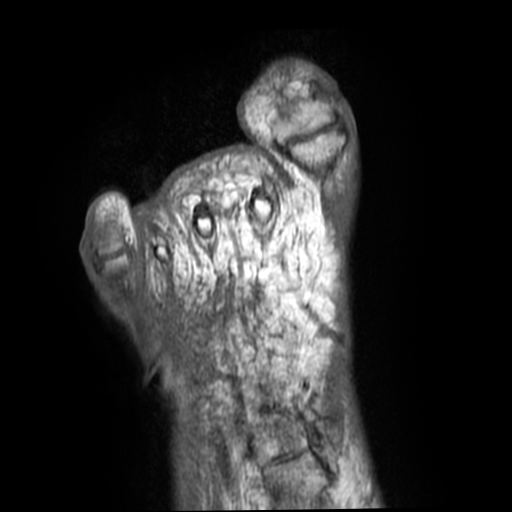
[im 33/33]
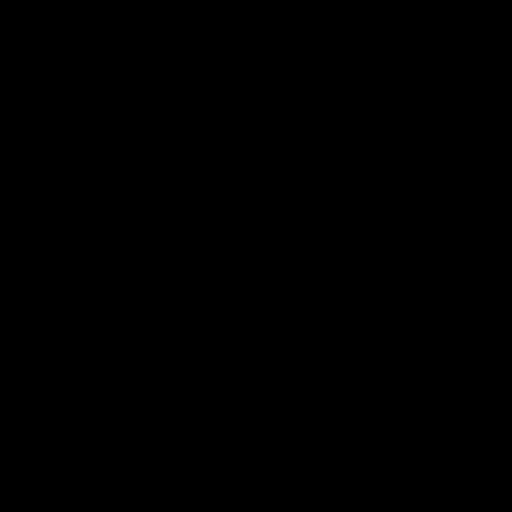

[Series 11: T1 · coronal · 3.0mm · 0.31mm/px · 5 of 45 slices shown (2 of 2)]
[im 1/45]
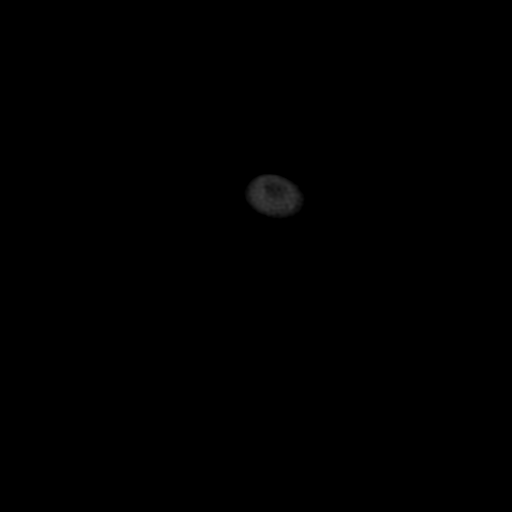
[im 12/45]
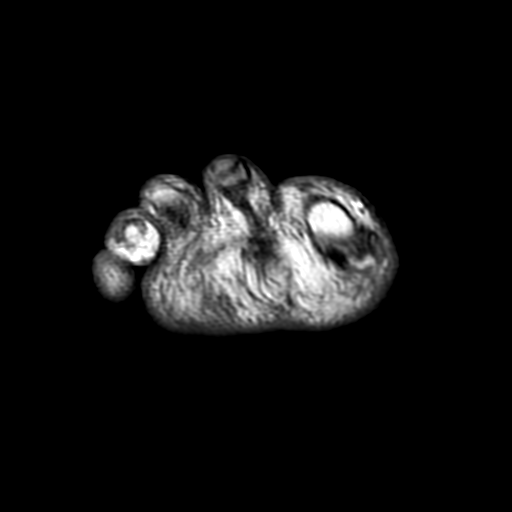
[im 23/45]
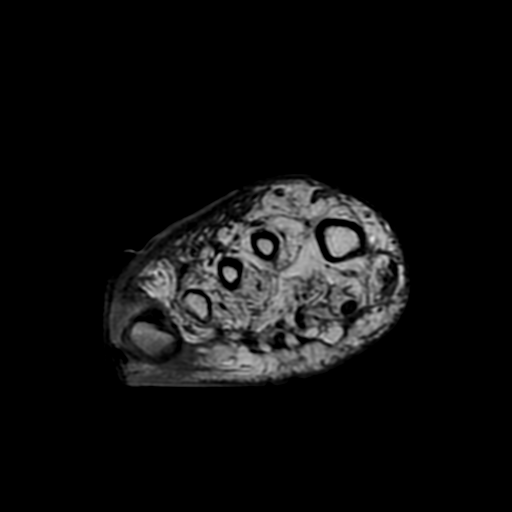
[im 34/45]
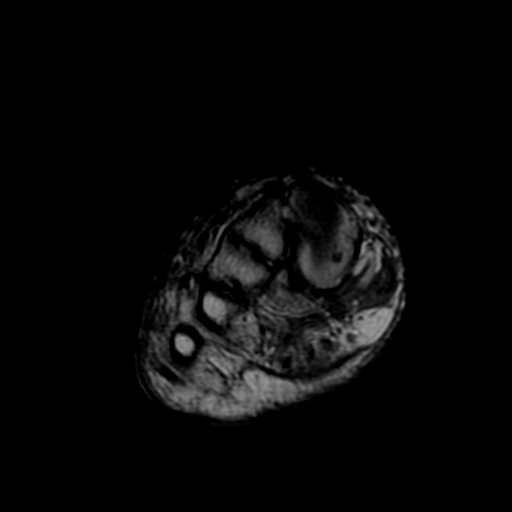
[im 45/45]
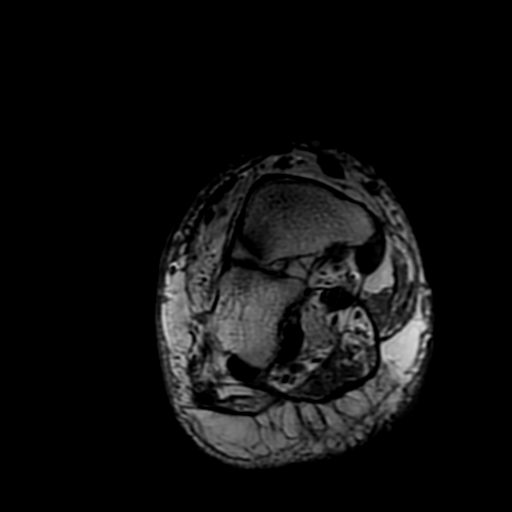

[Series 27: T1 fat-sat · coronal · 3.0mm · 0.29mm/px · 5 of 46 slices shown (2 of 2)]
[im 1/46]
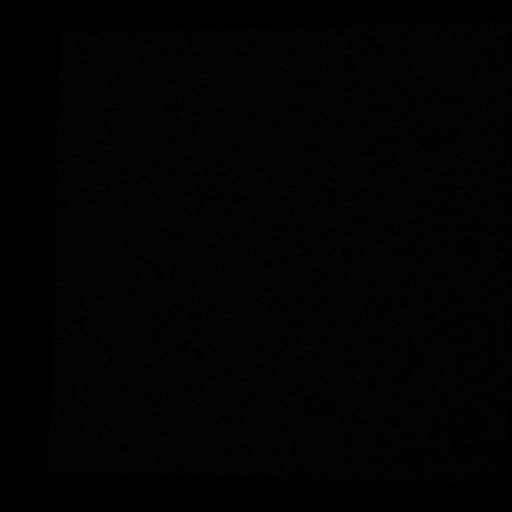
[im 12/46]
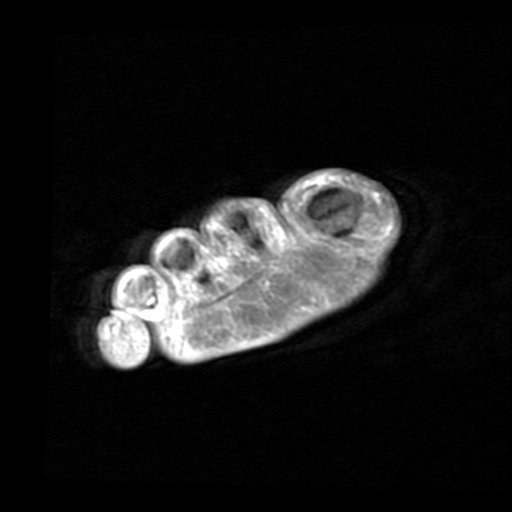
[im 23/46]
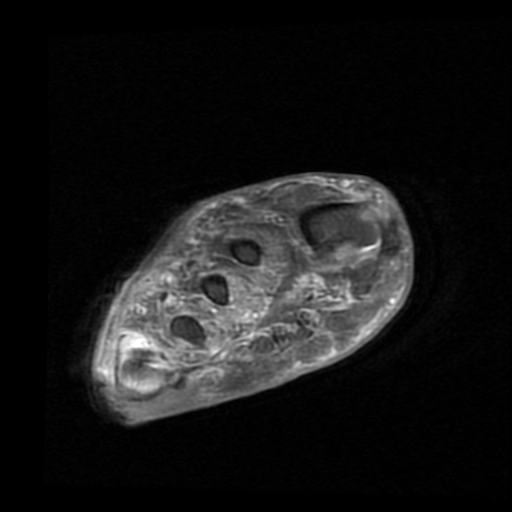
[im 34/46]
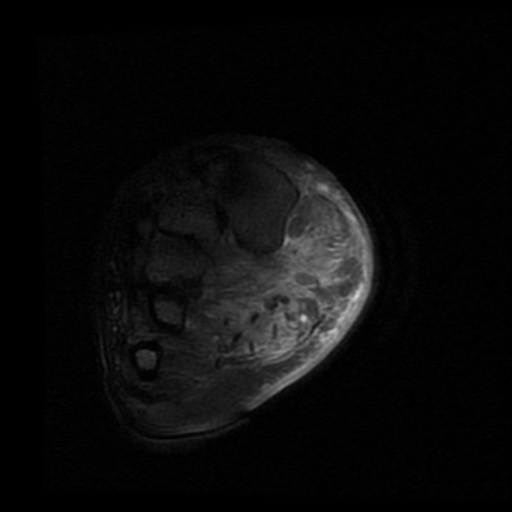
[im 46/46]
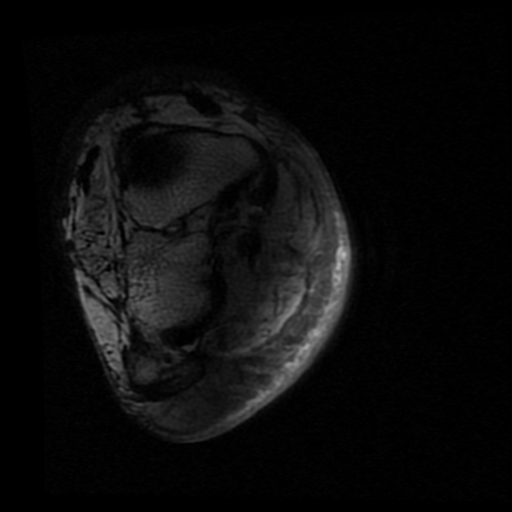

[22 of 40 positions shown; findings below may reference images not displayed]

FINDINGS: Despite efforts by the technologist and patient, motion artifact is
present on today's exam and could not be eliminated. This reduces
exam sensitivity and specificity.

Bones/Joint/Cartilage

Increased edema and enhancement with subtle early T1 marrow
hypointensity involving the fifth metatarsal head and fifth proximal
phalanx, consistent with osteomyelitis. No fracture or dislocation.
Normal alignment. No joint effusion. Mild osteoarthritis of the
first MTP joint and fourth tarsometatarsal joint.

Ligaments

Collateral ligaments are intact.

Muscles and Tendons
Flexor, peroneal and extensor compartment tendons are intact.
Increased T2 signal and atrophy within the intrinsic muscles of the
forefoot, nonspecific, but likely related to diabetic muscle
changes.

Soft tissue
Small ulceration on the lateral forefoot adjacent to the fifth MTP
joint extending close to the fifth metatarsal head. Mild dorsal
forefoot soft tissue swelling. No fluid collection or hematoma. No
soft tissue mass.
IMPRESSION: 1. Small ulceration on the lateral forefoot adjacent to the fifth
MTP joint with underlying osteomyelitis of the fifth metatarsal head
and fifth proximal phalanx. No abscess.

## 2019-02-04 ENCOUNTER — Ambulatory Visit (INDEPENDENT_AMBULATORY_CARE_PROVIDER_SITE_OTHER): Payer: Medicare Other | Admitting: Orthopedic Surgery

## 2019-02-04 ENCOUNTER — Encounter: Payer: Self-pay | Admitting: Orthopedic Surgery

## 2019-02-04 ENCOUNTER — Other Ambulatory Visit: Payer: Self-pay

## 2019-02-04 VITALS — Ht 74.0 in | Wt 260.0 lb

## 2019-02-04 DIAGNOSIS — M159 Polyosteoarthritis, unspecified: Secondary | ICD-10-CM

## 2019-02-04 DIAGNOSIS — M8949 Other hypertrophic osteoarthropathy, multiple sites: Secondary | ICD-10-CM

## 2019-02-04 NOTE — Progress Notes (Signed)
Office Visit Note   Patient: [name]           Date of Birth: 04/04/1950           MRN: [id number] Visit Date: 02/04/2019              Requested by: [name], MD [place],  [place] [address] PCP: [name], MD  Chief Complaint  Patient presents with  . Right Leg - Follow-up    BIL AKA f/u  . Left Leg - Follow-up      HPI: This is a pleasant gentleman who is status post right above knee amputation he is also status post a left above-knee amputation.  He is overall doing well he has a very pinpoint area on the right amputation stump that has some persistent serous drainage that has not changed.  He is in the process of getting an electric wheelchair as he is not a candidate for bilateral above-knee prosthetics.  Assessment & Plan: Visit Diagnoses: No diagnosis found.  Plan: He will follow up in 1 month.  Follow-Up Instructions: No follow-ups on file.   Ortho Exam  Patient is alert, oriented, no adenopathy, well-dressed, normal affect, normal respiratory effort Focused exam right above-knee amputation stump.  Completely healed with the exception of one central area which is pinpoint in nature right where the skin folds together.  There is no necrotic tissue he has just a small amount of serous drainage no foul odor noted fluctuance or surrounding cellulitis swelling is controlled Imaging: No results found. No images are attached to the encounter.  Labs: Lab Results  Component Value Date   HGBA1C 8.6 (H) 12/11/2018   HGBA1C 6.4 (A) 12/12/2017   HGBA1C 7.3 (A) 08/05/2017   ESRSEDRATE 35 (H) 03/31/2013   CRP 5.3 (H) 03/31/2013   REPTSTATUS 12/25/2017 FINAL 12/20/2017   GRAMSTAIN  12/30/2012    FEW WBC PRESENT, PREDOMINANTLY PMN RARE SQUAMOUS EPITHELIAL CELLS PRESENT ABUNDANT GRAM NEGATIVE RODS MODERATE GRAM POSITIVE COCCI IN PAIRS IN CLUSTERS   CULT  12/20/2017    NO GROWTH 5 DAYS Performed at [facility], [place]  [address], [place], [place] [address]    LABORGA KLEBSIELLA OXYTOCA 12/15/2012   LABORGA PSEUDOMONAS AERUGINOSA 12/15/2012     Lab Results  Component Value Date   ALBUMIN 2.9 (L) 12/15/2018   ALBUMIN 2.6 (L) 12/27/2017   ALBUMIN 2.7 (L) 12/26/2017    No results found for: MG No results found for: VD25OH  No results found for: PREALBUMIN CBC EXTENDED Latest Ref Rng & Units 12/15/2018 12/13/2018 12/12/2018  WBC 4.0 - 10.5 K/uL 10.1 10.5 15.7(H)  RBC 4.22 - 5.81 MIL/uL 4.07(L) 4.09(L) 4.22  HGB 13.0 - 17.0 g/dL 11.2(L) 11.1(L) 11.5(L)  HCT 39.0 - 52.0 % 33.9(L) 34.0(L) 35.5(L)  PLT 150 - 400 K/uL 282 257 295  NEUTROABS 1.7 - 7.7 K/uL 7.5 - -  LYMPHSABS 0.7 - 4.0 K/uL 1.2 - -     Body mass index is 33.38 kg/m.  Orders:  No orders of the defined types were placed in this encounter.  No orders of the defined types were placed in this encounter.    Procedures: No procedures performed  Clinical Data: No additional findings.  ROS:  All other systems negative, except as noted in the HPI. Review of Systems  Objective: Vital Signs: Ht 6\' 2"  (1.88 m)   Wt 260 lb (117.9 kg)   BMI 33.38 kg/m  Specialty Comments:  No specialty comments available.  PMFS History: Patient Active Problem List   Diagnosis Date Noted  . Above knee amputation of right lower extremity ([employer]) 12/14/2018  . Atherosclerosis of native artery of right leg with gangrene ([place]) 12/11/2018  . Hypoalbuminemia due to protein-calorie malnutrition ([place])   . Type 2 diabetes mellitus with peripheral neuropathy (HCC)   . Left above-knee amputee ([place]) 12/26/2017  . Unilateral AKA, left ([place])   . History of complete ray amputation of fifth toe of right foot ([place])   . Postoperative pain   . Acute blood loss anemia   . Essential hypertension   . Atherosclerosis of native arteries of extremities with gangrene, right leg ([place]) 02/19/2017  . Chronic renal disease, stage III 02/19/2017  . Ulcer of toe of right foot,  limited to breakdown of skin ([place]) 10/11/2016  . Type 2 diabetes mellitus with diabetic polyneuropathy, with long-term current use of insulin ([place]) 03/29/2015  . Advance directive discussed with patient 02/07/2014  . Leucocytosis 04/01/2013  . Normocytic anemia 04/01/2013  . Diabetic Charcot foot ([place]) 04/01/2013  . Routine general medical examination at a health care facility 12/25/2010  . PERSONAL HX COLONIC POLYPS 11/22/2009  . Venous (peripheral) insufficiency 06/29/2008  . Osteoarthritis, multiple sites 04/03/2007  . ERECTILE DYSFUNCTION, ORGANIC 06/13/2006  . Hyperlipemia 06/11/2006  . GLAUCOMA 06/11/2006  . Coronary atherosclerosis of native coronary artery 06/11/2006  . Obstructive sleep apnea 06/11/2006   Past Medical History:  Diagnosis Date  . Cellulitis and abscess of left lower extremity 12/20/2017  . Coronary atherosclerosis of unspecified type of vessel, native or graft   . Diabetes mellitus without complication ([place])    type 2  . Hemorrhage of rectum and anus   . History of kidney stones    passed stones  . Impotence of organic origin   . Internal hemorrhoids without mention of complication   . Left below-knee amputee ([place]) 02/23/2016  . Malignant neoplasm of prostate ([place])   . Mononeuritis of unspecified site   . Osteoarthrosis, unspecified whether generalized or localized, unspecified site   . Other and unspecified hyperlipidemia   . Peripheral vascular disease ([place])   . Personal history of colonic polyps   . Personal history of diabetic foot ulcer    saw wound center, resolved 05/08/2010  . Personal history of gallstones   . Routine general medical examination at a health care facility   . Subacute osteomyelitis, right ankle and foot ([place])   . Type II or unspecified type diabetes mellitus with neurological manifestations, not stated as uncontrolled(250.60)   . Unspecified glaucoma(365.9)   . Unspecified sleep apnea    cpcp  . Unspecified venous (peripheral)  insufficiency     Family History  Problem Relation Age of Onset  . Lung cancer Father   . Multiple sclerosis Mother   . Heart attack Other        paternal aunts and uncles  . Peripheral vascular disease Maternal Grandfather        several amputations    Past Surgical History:  Procedure Laterality Date  . AMPUTATION Left 12/30/2012   Procedure: AMPUTATION RAY ;  Surgeon: Meredith Pel, MD;  Location: [facility];  Service: Orthopedics;  Laterality: Left;  LEFT GREAT TOE RAY AMPUTATION  . AMPUTATION Left 02/23/2016   Procedure: Left Below Knee Amputation;  Surgeon: [name], MD;  Location: [facility];  Service: Orthopedics;  Laterality: Left;  . AMPUTATION Left 12/24/2017   Procedure: LEFT  ABOVE KNEE AMPUTATION;  Surgeon: [name], MD;  Location: [facility];  Service: Orthopedics;  Laterality: Left;  . AMPUTATION Right 12/24/2017   Procedure: RIGHT 5TH RAY AMPUTATION;  Surgeon: [name], MD;  Location: [facility];  Service: Orthopedics;  Laterality: Right;  . AMPUTATION Right 12/11/2018   Procedure: RIGHT ABOVE KNEE AMPUTATION;  Surgeon: [name], MD;  Location: [facility];  Service: Orthopedics;  Laterality: Right;  . APPENDECTOMY    . BASAL CELL CARCINOMA EXCISION  2/16   left forearm  . BELPHAROPTOSIS REPAIR    . CARDIAC CATHETERIZATION  1998   Negative  . CATARACT EXTRACTION Right 2017   then lid surgery  . COLONOSCOPY    . CORONARY ARTERY BYPASS GRAFT  09/2005   5 bypasses per patient, Post op AFIB  . EYE SURGERY    . FOOT BONE EXCISION Left 11/03/2013   DR DUDA   . I & D EXTREMITY Left 11/03/2013   Procedure: Left Foot Partial Bone Excision Cuboid and Medial Cuneiform, Wound Closures;  Surgeon: [name], MD;  Location: [facility];  Service: Orthopedics;  Laterality: Left;  . INSERTION PROSTATE RADIATION SEED  2009   RT and seeds for prostate cancer  . KIDNEY STONE SURGERY  04/1993  . RCA stents  04/2003   EF 55%  . RETINAL DETACHMENT SURGERY  2002-2003  . thrombosed  vein  1993   Right leg   Social History   Occupational History  . Occupation: Heavy equipment mechanic--retired  Tobacco Use  . Smoking status: Former Smoker    Packs/day: 2.00    Years: 35.00    Pack years: 70.00    Types: Cigars, Cigarettes    Quit date: 01/07/2001    Years since quitting: 18.0  . Smokeless tobacco: Never Used  Substance and Sexual Activity  . Alcohol use: Not Currently    Comment: rare  . Drug use: No  . Sexual activity: Yes    Partners: Female

## 2019-02-08 ENCOUNTER — Other Ambulatory Visit: Payer: Self-pay | Admitting: Orthopedic Surgery

## 2019-02-21 ENCOUNTER — Ambulatory Visit: Payer: Medicare Other | Attending: Internal Medicine

## 2019-02-21 DIAGNOSIS — Z23 Encounter for immunization: Secondary | ICD-10-CM

## 2019-02-21 NOTE — Progress Notes (Signed)
   Covid-19 Vaccination Clinic  Name:  [name]    MRN: [id number] DOB: 10/18/1950  02/21/2019  [name] was observed post Covid-19 immunization for 15 minutes without incidence. He was provided with Vaccine Information Sheet and instruction to access the V-Safe system.   [name] was instructed to call 911 with any severe reactions post vaccine: Marland Kitchen Difficulty breathing  . Swelling of your face and throat  . A fast heartbeat  . A bad rash all over your body  . Dizziness and weakness    Immunizations Administered    Name Date Dose VIS Date Route   Pfizer COVID-19 Vaccine 02/21/2019 11:50 AM 0.3 mL 12/18/2018 Intramuscular   Manufacturer: [place]   Lot: [id number]   [place]: [id number]

## 2019-02-23 ENCOUNTER — Other Ambulatory Visit: Payer: Self-pay | Admitting: Dermatology

## 2019-02-23 DIAGNOSIS — L8990 Pressure ulcer of unspecified site, unspecified stage: Secondary | ICD-10-CM | POA: Diagnosis not present

## 2019-02-23 DIAGNOSIS — C44612 Basal cell carcinoma of skin of right upper limb, including shoulder: Secondary | ICD-10-CM | POA: Diagnosis not present

## 2019-02-23 DIAGNOSIS — C4491 Basal cell carcinoma of skin, unspecified: Secondary | ICD-10-CM

## 2019-02-23 HISTORY — DX: Basal cell carcinoma of skin, unspecified: C44.91

## 2019-02-26 ENCOUNTER — Encounter: Payer: Medicare Other | Admitting: Internal Medicine

## 2019-03-03 ENCOUNTER — Encounter: Payer: Medicare Other | Admitting: Internal Medicine

## 2019-03-05 ENCOUNTER — Other Ambulatory Visit: Payer: Self-pay | Admitting: Internal Medicine

## 2019-03-08 ENCOUNTER — Encounter: Payer: Self-pay | Admitting: Orthopedic Surgery

## 2019-03-08 ENCOUNTER — Other Ambulatory Visit: Payer: Self-pay

## 2019-03-08 ENCOUNTER — Ambulatory Visit (INDEPENDENT_AMBULATORY_CARE_PROVIDER_SITE_OTHER): Payer: Medicare Other | Admitting: Orthopedic Surgery

## 2019-03-08 DIAGNOSIS — Z89611 Acquired absence of right leg above knee: Secondary | ICD-10-CM

## 2019-03-08 NOTE — Progress Notes (Signed)
Office Visit Note   Patient: [name]           Date of Birth: 1950-06-10           MRN: [id number] Visit Date: 03/08/2019              Requested by: [name], MD [address] [place],  [place] [address] PCP: [name], MD  Chief Complaint  Patient presents with  . Follow-up      HPI: This is a pleasant gentleman who is status post bilateral above-knee amputations.  The most recent one was in December.  He has no complaints.  He had one area that was still pinpoint draining but this has improved and not drained  Assessment & Plan: Visit Diagnoses: No diagnosis found.  Plan: He may follow-up as needed if he has any concerns.  He feels competent at transfers  Follow-Up Instructions: No follow-ups on file.   Ortho Exam  Patient is alert, oriented, no adenopathy, well-dressed, normal affect, normal respiratory effort. Focused examination of his right above-knee amputation well-healed surgical incision there is some scabbing where the previous drainage was but there is no surrounding cellulitis or no fluctuance.  No foul odor  Imaging: No results found. No images are attached to the encounter.  Labs: Lab Results  Component Value Date   HGBA1C 8.6 (H) 12/11/2018   HGBA1C 6.4 (A) 12/12/2017   HGBA1C 7.3 (A) 08/05/2017   ESRSEDRATE 35 (H) 03/31/2013   CRP 5.3 (H) 03/31/2013   REPTSTATUS 12/25/2017 FINAL 12/20/2017   GRAMSTAIN  12/30/2012    FEW WBC PRESENT, PREDOMINANTLY PMN RARE SQUAMOUS EPITHELIAL CELLS PRESENT ABUNDANT GRAM NEGATIVE RODS MODERATE GRAM POSITIVE COCCI IN PAIRS IN CLUSTERS   CULT  12/20/2017    NO GROWTH 5 DAYS Performed at [facility], [place] [address], [place], [place] [address]    LABORGA KLEBSIELLA OXYTOCA 12/15/2012   LABORGA PSEUDOMONAS AERUGINOSA 12/15/2012     Lab Results  Component Value Date   ALBUMIN 2.9 (L) 12/15/2018   ALBUMIN 2.6 (L) 12/27/2017   ALBUMIN 2.7 (L) 12/26/2017    No results found  for: MG No results found for: VD25OH  No results found for: PREALBUMIN CBC EXTENDED Latest Ref Rng & Units 12/15/2018 12/13/2018 12/12/2018  WBC 4.0 - 10.5 K/uL 10.1 10.5 15.7(H)  RBC 4.22 - 5.81 MIL/uL 4.07(L) 4.09(L) 4.22  HGB 13.0 - 17.0 g/dL 11.2(L) 11.1(L) 11.5(L)  HCT 39.0 - 52.0 % 33.9(L) 34.0(L) 35.5(L)  PLT 150 - 400 K/uL 282 257 295  NEUTROABS 1.7 - 7.7 K/uL 7.5 - -  LYMPHSABS 0.7 - 4.0 K/uL 1.2 - -     There is no height or weight on file to calculate BMI.  Orders:  No orders of the defined types were placed in this encounter.  No orders of the defined types were placed in this encounter.    Procedures: No procedures performed  Clinical Data: No additional findings.  ROS:  All other systems negative, except as noted in the HPI. Review of Systems  Objective: Vital Signs: There were no vitals taken for this visit.  Specialty Comments:  No specialty comments available.  PMFS History: Patient Active Problem List   Diagnosis Date Noted  . Above knee amputation of right lower extremity ([employer]) 12/14/2018  . Atherosclerosis of native artery of right leg with gangrene ([place]) 12/11/2018  . Hypoalbuminemia due to protein-calorie malnutrition ([place])   . Type 2 diabetes mellitus with  peripheral neuropathy ([place])   . Left above-knee amputee ([place]) 12/26/2017  . Unilateral AKA, left ([place])   . History of complete ray amputation of fifth toe of right foot ([place])   . Postoperative pain   . Acute blood loss anemia   . Essential hypertension   . Atherosclerosis of native arteries of extremities with gangrene, right leg ([place]) 02/19/2017  . Chronic renal disease, stage III 02/19/2017  . Ulcer of toe of right foot, limited to breakdown of skin ([place]) 10/11/2016  . Type 2 diabetes mellitus with diabetic polyneuropathy, with long-term current use of insulin ([place]) 03/29/2015  . Advance directive discussed with patient 02/07/2014  . Leucocytosis 04/01/2013  . Normocytic anemia  04/01/2013  . Diabetic Charcot foot ([place]) 04/01/2013  . Routine general medical examination at a health care facility 12/25/2010  . PERSONAL HX COLONIC POLYPS 11/22/2009  . Venous (peripheral) insufficiency 06/29/2008  . Osteoarthritis, multiple sites 04/03/2007  . ERECTILE DYSFUNCTION, ORGANIC 06/13/2006  . Hyperlipemia 06/11/2006  . GLAUCOMA 06/11/2006  . Coronary atherosclerosis of native coronary artery 06/11/2006  . Obstructive sleep apnea 06/11/2006   Past Medical History:  Diagnosis Date  . Cellulitis and abscess of left lower extremity 12/20/2017  . Coronary atherosclerosis of unspecified type of vessel, native or graft   . Diabetes mellitus without complication ([place])    type 2  . Hemorrhage of rectum and anus   . History of kidney stones    passed stones  . Impotence of organic origin   . Internal hemorrhoids without mention of complication   . Left below-knee amputee ([place]) 02/23/2016  . Malignant neoplasm of prostate ([place])   . Mononeuritis of unspecified site   . Osteoarthrosis, unspecified whether generalized or localized, unspecified site   . Other and unspecified hyperlipidemia   . Peripheral vascular disease ([place])   . Personal history of colonic polyps   . Personal history of diabetic foot ulcer    saw wound center, resolved 05/08/2010  . Personal history of gallstones   . Routine general medical examination at a health care facility   . Subacute osteomyelitis, right ankle and foot ([place])   . Type II or unspecified type diabetes mellitus with neurological manifestations, not stated as uncontrolled(250.60)   . Unspecified glaucoma(365.9)   . Unspecified sleep apnea    cpcp  . Unspecified venous (peripheral) insufficiency     Family History  Problem Relation Age of Onset  . Lung cancer Father   . Multiple sclerosis Mother   . Heart attack Other        paternal aunts and uncles  . Peripheral vascular disease Maternal Grandfather        several amputations      Past Surgical History:  Procedure Laterality Date  . AMPUTATION Left 12/30/2012   Procedure: AMPUTATION RAY ;  Surgeon: Meredith Pel, MD;  [facility];  Service: Orthopedics;  Laterality: Left;  LEFT GREAT TOE RAY AMPUTATION  . AMPUTATION Left 02/23/2016   Procedure: Left Below Knee Amputation;  Surgeon: [name], MD;  Location: [facility];  Service: Orthopedics;  Laterality: Left;  . AMPUTATION Left 12/24/2017   Procedure: LEFT ABOVE KNEE AMPUTATION;  Surgeon: [name], MD;  Location: [facility];  Service: Orthopedics;  Laterality: Left;  . AMPUTATION Right 12/24/2017   Procedure: RIGHT 5TH RAY AMPUTATION;  Surgeon: [name], MD;  Location: [facility];  Service: Orthopedics;  Laterality: Right;  . AMPUTATION Right 12/11/2018   Procedure: RIGHT ABOVE KNEE  AMPUTATION;  Surgeon: [name], MD;  Location: [facility];  Service: Orthopedics;  Laterality: Right;  . APPENDECTOMY    . BASAL CELL CARCINOMA EXCISION  2/16   left forearm  . BELPHAROPTOSIS REPAIR    . CARDIAC CATHETERIZATION  1998   Negative  . CATARACT EXTRACTION Right 2017   then lid surgery  . COLONOSCOPY    . CORONARY ARTERY BYPASS GRAFT  09/2005   5 bypasses per patient, Post op AFIB  . EYE SURGERY    . FOOT BONE EXCISION Left 11/03/2013   DR DUDA   . I & D EXTREMITY Left 11/03/2013   Procedure: Left Foot Partial Bone Excision Cuboid and Medial Cuneiform, Wound Closures;  Surgeon: [name], MD;  Location: [facility];  Service: Orthopedics;  Laterality: Left;  . INSERTION PROSTATE RADIATION SEED  2009   RT and seeds for prostate cancer  . KIDNEY STONE SURGERY  04/1993  . RCA stents  04/2003   EF 55%  . RETINAL DETACHMENT SURGERY  2002-2003  . thrombosed vein  1993   Right leg   Social History   Occupational History  . Occupation: Heavy equipment mechanic--retired  Tobacco Use  . Smoking status: Former Smoker    Packs/day: 2.00    Years: 35.00    Pack years: 70.00    Types: Cigars, Cigarettes    Quit  date: 01/07/2001    Years since quitting: 18.1  . Smokeless tobacco: Never Used  Substance and Sexual Activity  . Alcohol use: Not Currently    Comment: rare  . Drug use: No  . Sexual activity: Yes    Partners: Female

## 2019-03-16 ENCOUNTER — Ambulatory Visit: Payer: Medicare Other | Attending: Internal Medicine

## 2019-03-16 DIAGNOSIS — Z23 Encounter for immunization: Secondary | ICD-10-CM

## 2019-03-16 NOTE — Progress Notes (Signed)
   Covid-19 Vaccination Clinic  Name:  [name]    MRN: [id number] DOB: 1950-01-24  03/16/2019  [name] was observed post Covid-19 immunization for 15 minutes without incident. He was provided with Vaccine Information Sheet and instruction to access the V-Safe system.   [name] was instructed to call 911 with any severe reactions post vaccine: Marland Kitchen Difficulty breathing  . Swelling of face and throat  . A fast heartbeat  . A bad rash all over body  . Dizziness and weakness   Immunizations Administered    Name Date Dose VIS Date Route   Pfizer COVID-19 Vaccine 03/16/2019  3:34 PM 0.3 mL 12/18/2018 Intramuscular   Manufacturer: [place]   Lot: [id number]   [place]: [id number]

## 2019-03-18 ENCOUNTER — Telehealth: Payer: Self-pay

## 2019-03-18 NOTE — Telephone Encounter (Signed)
Patients family member called, left message stating they are still waiting for his power chair.  Stated that we have yet to turn in any paper work to the receiving facility.  Noted on multiple notes where he is in waiting for a power chair.  After researching I found out that the paperwork was filled out and signed and then sent to Andria Rhein of [facility] and she is now taking care of the wheelchair.

## 2019-03-22 ENCOUNTER — Other Ambulatory Visit: Payer: Self-pay | Admitting: Internal Medicine

## 2019-03-25 ENCOUNTER — Telehealth: Payer: Self-pay | Admitting: Internal Medicine

## 2019-03-25 NOTE — Chronic Care Management (AMB) (Signed)
  Chronic Care Management   Note  03/25/2019 Name: DARSHAUN HEIBEL MRN: [id number] DOB: 1950/10/26  [name] is a 69 y.o. year old male who is a primary care patient of [name], MD. I reached out to [employer] by phone today in response to a referral sent by Mr. De Hollingshead Rooks's PCP, [name], MD.   Mr. Greeley was given information about Chronic Care Management services today including:  1. CCM service includes personalized support from designated clinical staff supervised by his physician, including individualized plan of care and coordination with other care providers 2. 24/7 contact phone numbers for assistance for urgent and routine care needs. 3. Service will only be billed when office clinical staff spend 20 minutes or more in a month to coordinate care. 4. Only one practitioner may furnish and bill the service in a calendar month. 5. The patient may stop CCM services at any time (effective at the end of the month) by phone call to the office staff.   Patient agreed to services and verbal consent obtained.   Follow up plan:   Raynicia Dukes [employer] Scheduler

## 2019-03-29 ENCOUNTER — Other Ambulatory Visit: Payer: Self-pay | Admitting: Orthopedic Surgery

## 2019-04-04 ENCOUNTER — Other Ambulatory Visit: Payer: Self-pay

## 2019-04-04 ENCOUNTER — Emergency Department (HOSPITAL_COMMUNITY)
Admission: EM | Admit: 2019-04-04 | Discharge: 2019-04-05 | Disposition: A | Discharge status: Home / Self Care | Payer: Medicare Other | Attending: Emergency Medicine | Admitting: Emergency Medicine

## 2019-04-04 ENCOUNTER — Encounter (HOSPITAL_COMMUNITY): Payer: Self-pay | Admitting: *Deleted

## 2019-04-04 DIAGNOSIS — L0390 Cellulitis, unspecified: Secondary | ICD-10-CM

## 2019-04-04 DIAGNOSIS — Z794 Long term (current) use of insulin: Secondary | ICD-10-CM | POA: Diagnosis not present

## 2019-04-04 DIAGNOSIS — N183 Chronic kidney disease, stage 3 unspecified: Secondary | ICD-10-CM | POA: Insufficient documentation

## 2019-04-04 DIAGNOSIS — Z89611 Acquired absence of right leg above knee: Secondary | ICD-10-CM | POA: Insufficient documentation

## 2019-04-04 DIAGNOSIS — L03116 Cellulitis of left lower limb: Secondary | ICD-10-CM | POA: Diagnosis not present

## 2019-04-04 DIAGNOSIS — L03115 Cellulitis of right lower limb: Secondary | ICD-10-CM | POA: Diagnosis not present

## 2019-04-04 DIAGNOSIS — Z79899 Other long term (current) drug therapy: Secondary | ICD-10-CM | POA: Diagnosis not present

## 2019-04-04 DIAGNOSIS — Z87891 Personal history of nicotine dependence: Secondary | ICD-10-CM | POA: Diagnosis not present

## 2019-04-04 DIAGNOSIS — I2510 Atherosclerotic heart disease of native coronary artery without angina pectoris: Secondary | ICD-10-CM | POA: Diagnosis not present

## 2019-04-04 DIAGNOSIS — Z89612 Acquired absence of left leg above knee: Secondary | ICD-10-CM | POA: Diagnosis not present

## 2019-04-04 DIAGNOSIS — I129 Hypertensive chronic kidney disease with stage 1 through stage 4 chronic kidney disease, or unspecified chronic kidney disease: Secondary | ICD-10-CM | POA: Diagnosis not present

## 2019-04-04 DIAGNOSIS — L539 Erythematous condition, unspecified: Secondary | ICD-10-CM | POA: Diagnosis not present

## 2019-04-04 DIAGNOSIS — E1122 Type 2 diabetes mellitus with diabetic chronic kidney disease: Secondary | ICD-10-CM | POA: Insufficient documentation

## 2019-04-04 NOTE — ED Triage Notes (Signed)
THE PT  IS A BI-LATERAL AK AMPUTATIONS  HE LAST HAD HIS RT AMPUTATED  DEC 15TH  HE BUMPED THE RT STUMP ON SOMETHING APROX 3 DAYS AGO  STILL BLEEDING HE IS ONLY ON ASPIRIN FOR BLOOD THINNER

## 2019-04-05 ENCOUNTER — Emergency Department (HOSPITAL_COMMUNITY): Payer: Medicare Other

## 2019-04-05 DIAGNOSIS — L03116 Cellulitis of left lower limb: Secondary | ICD-10-CM | POA: Diagnosis not present

## 2019-04-05 DIAGNOSIS — L03115 Cellulitis of right lower limb: Secondary | ICD-10-CM | POA: Diagnosis not present

## 2019-04-05 DIAGNOSIS — L539 Erythematous condition, unspecified: Secondary | ICD-10-CM | POA: Diagnosis not present

## 2019-04-05 LAB — BASIC METABOLIC PANEL
Anion gap: 16 — ABNORMAL HIGH (ref 5–15)
BUN: 33 mg/dL — ABNORMAL HIGH (ref 8–23)
CO2: 22 mmol/L (ref 22–32)
Calcium: 9.2 mg/dL (ref 8.9–10.3)
Chloride: 101 mmol/L (ref 98–111)
Creatinine, Ser: 1.07 mg/dL (ref 0.61–1.24)
GFR calc Af Amer: >60 mL/min (ref >60)
GFR calc non Af Amer: >60 mL/min (ref >60)
Glucose, Bld: 141 mg/dL — ABNORMAL HIGH (ref 70–99)
Potassium: 4.7 mmol/L (ref 3.5–5.1)
Sodium: 139 mmol/L (ref 135–145)

## 2019-04-05 LAB — CBC WITH DIFFERENTIAL/PLATELET
Abs Immature Granulocytes: 0.23 10*3/uL — ABNORMAL HIGH (ref 0.00–0.07)
Basophils Absolute: 0.2 10*3/uL — ABNORMAL HIGH (ref 0.0–0.1)
Basophils Relative: 2 %
Eosinophils Absolute: 0.4 10*3/uL (ref 0.0–0.5)
Eosinophils Relative: 3 %
HCT: 50 % (ref 39.0–52.0)
Hemoglobin: 15.9 g/dL (ref 13.0–17.0)
Immature Granulocytes: 2 %
Lymphocytes Relative: 10 %
Lymphs Abs: 1.4 10*3/uL (ref 0.7–4.0)
MCH: 25 pg — ABNORMAL LOW (ref 26.0–34.0)
MCHC: 31.8 g/dL (ref 30.0–36.0)
MCV: 78.7 fL — ABNORMAL LOW (ref 80.0–100.0)
Monocytes Absolute: 0.9 10*3/uL (ref 0.1–1.0)
Monocytes Relative: 7 %
Neutro Abs: 10.1 10*3/uL — ABNORMAL HIGH (ref 1.7–7.7)
Neutrophils Relative %: 76 %
Platelets: 382 10*3/uL (ref 150–400)
RBC: 6.35 MIL/uL — ABNORMAL HIGH (ref 4.22–5.81)
RDW: 18.9 % — ABNORMAL HIGH (ref 11.5–15.5)
WBC: 13.1 10*3/uL — ABNORMAL HIGH (ref 4.0–10.5)
nRBC: 0 % (ref 0.0–0.2)

## 2019-04-05 MED ORDER — CLINDAMYCIN PHOSPHATE 600 MG/50ML IV SOLN
600.0000 mg | Freq: Once | INTRAVENOUS | Status: AC
  Administered 2019-04-05: 600 mg via INTRAVENOUS
  Filled 2019-04-05: qty 50

## 2019-04-05 MED ORDER — CLINDAMYCIN HCL 300 MG PO CAPS: 300.0000 mg | ORAL_CAPSULE | Freq: Four times a day (QID) | ORAL | 0 refills | Status: DC

## 2019-04-05 NOTE — ED Notes (Signed)
Pt verbalized understanding of d/c instructions, follow up care, s/s requiring return to ED, and prescriptions. Pt had no further questions at this time.

## 2019-04-05 NOTE — ED Provider Notes (Signed)
[facility] EMERGENCY DEPARTMENT Provider Note   CSN: [id number] Arrival date & time: 04/04/19  2230     History Chief Complaint  Patient presents with  . Wound Check    [name] is a 69 y.o. male.  Patient is a 69 year old male with past medical history of diabetes, peripheral vascular disease status post bilateral above-the-knee amputations, chronic renal insufficiency.  Patient presents today for evaluation of bleeding/drainage from his right above-the-knee amputation site.  He has not been having any problems since the surgery was performed in December.  3 days ago, he bumped his leg transferring from his chair.  Bloody fluid was expressed from one small area and has continued to drain since then.  He denies any fevers or chills.  He denies any increasing pain.  The history is provided by the patient.  Wound Check This is a new problem. Episode onset: 3 days ago. The problem occurs constantly. The problem has been gradually worsening. Nothing aggravates the symptoms. Nothing relieves the symptoms. He has tried nothing for the symptoms.       Past Medical History:  Diagnosis Date  . Cellulitis and abscess of left lower extremity 12/20/2017  . Coronary atherosclerosis of unspecified type of vessel, native or graft   . Diabetes mellitus without complication ([place])    type 2  . Hemorrhage of rectum and anus   . History of kidney stones    passed stones  . Impotence of organic origin   . Internal hemorrhoids without mention of complication   . Left below-knee amputee ([place]) 02/23/2016  . Malignant neoplasm of prostate ([place])   . Mononeuritis of unspecified site   . Osteoarthrosis, unspecified whether generalized or localized, unspecified site   . Other and unspecified hyperlipidemia   . Peripheral vascular disease ([place])   . Personal history of colonic polyps   . Personal history of diabetic foot ulcer    saw wound center, resolved 05/08/2010  . Personal  history of gallstones   . Routine general medical examination at a health care facility   . Subacute osteomyelitis, right ankle and foot ([place])   . Type II or unspecified type diabetes mellitus with neurological manifestations, not stated as uncontrolled(250.60)   . Unspecified glaucoma(365.9)   . Unspecified sleep apnea    cpcp  . Unspecified venous (peripheral) insufficiency     Patient Active Problem List   Diagnosis Date Noted  . Above knee amputation of right lower extremity ([place]) 12/14/2018  . Atherosclerosis of native artery of right leg with gangrene ([place]) 12/11/2018  . Hypoalbuminemia due to protein-calorie malnutrition ([place])   . Type 2 diabetes mellitus with peripheral neuropathy (HCC)   . Left above-knee amputee ([place]) 12/26/2017  . Unilateral AKA, left ([place])   . History of complete ray amputation of fifth toe of right foot ([place])   . Postoperative pain   . Acute blood loss anemia   . Essential hypertension   . Atherosclerosis of native arteries of extremities with gangrene, right leg ([place]) 02/19/2017  . Chronic renal disease, stage III 02/19/2017  . Ulcer of toe of right foot, limited to breakdown of skin ([place]) 10/11/2016  . Type 2 diabetes mellitus with diabetic polyneuropathy, with long-term current use of insulin ([place]) 03/29/2015  . Advance directive discussed with patient 02/07/2014  . Leucocytosis 04/01/2013  . Normocytic anemia 04/01/2013  . Diabetic Charcot foot ([place]) 04/01/2013  . Routine general medical examination at a health care facility 12/25/2010  . PERSONAL  HX COLONIC POLYPS 11/22/2009  . Venous (peripheral) insufficiency 06/29/2008  . Osteoarthritis, multiple sites 04/03/2007  . ERECTILE DYSFUNCTION, ORGANIC 06/13/2006  . Hyperlipemia 06/11/2006  . GLAUCOMA 06/11/2006  . Coronary atherosclerosis of native coronary artery 06/11/2006  . Obstructive sleep apnea 06/11/2006    Past Surgical History:  Procedure Laterality Date  . AMPUTATION Left  12/30/2012   Procedure: AMPUTATION RAY ;  Surgeon: Meredith Pel, MD;  Location: [facility];  Service: Orthopedics;  Laterality: Left;  LEFT GREAT TOE RAY AMPUTATION  . AMPUTATION Left 02/23/2016   Procedure: Left Below Knee Amputation;  Surgeon: [name], MD;  Location: [place];  Service: Orthopedics;  Laterality: Left;  . AMPUTATION Left 12/24/2017   Procedure: LEFT ABOVE KNEE AMPUTATION;  Surgeon: [name], MD;  Location: [place];  Service: Orthopedics;  Laterality: Left;  . AMPUTATION Right 12/24/2017   Procedure: RIGHT 5TH RAY AMPUTATION;  Surgeon: [name], MD;  Location: [place];  Service: Orthopedics;  Laterality: Right;  . AMPUTATION Right 12/11/2018   Procedure: RIGHT ABOVE KNEE AMPUTATION;  Surgeon: [name], MD;  Location: [place];  Service: Orthopedics;  Laterality: Right;  . APPENDECTOMY    . BASAL CELL CARCINOMA EXCISION  2/16   left forearm  . BELPHAROPTOSIS REPAIR    . CARDIAC CATHETERIZATION  1998   Negative  . CATARACT EXTRACTION Right 2017   then lid surgery  . COLONOSCOPY    . CORONARY ARTERY BYPASS GRAFT  09/2005   5 bypasses per patient, Post op AFIB  . EYE SURGERY    . FOOT BONE EXCISION Left 11/03/2013   DR DUDA   . I & D EXTREMITY Left 11/03/2013   Procedure: Left Foot Partial Bone Excision Cuboid and Medial Cuneiform, Wound Closures;  Surgeon: [name], MD;  Location: [place];  Service: Orthopedics;  Laterality: Left;  . INSERTION PROSTATE RADIATION SEED  2009   RT and seeds for prostate cancer  . KIDNEY STONE SURGERY  04/1993  . RCA stents  04/2003   EF 55%  . RETINAL DETACHMENT SURGERY  2002-2003  . thrombosed vein  1993   Right leg       Family History  Problem Relation Age of Onset  . Lung cancer Father   . Multiple sclerosis Mother   . Heart attack Other        paternal aunts and uncles  . Peripheral vascular disease Maternal Grandfather        several amputations    Social History   Tobacco Use  . Smoking status: Former  Smoker    Packs/day: 2.00    Years: 35.00    Pack years: 70.00    Types: Cigars, Cigarettes    Quit date: 01/07/2001    Years since quitting: 18.2  . Smokeless tobacco: Never Used  Substance Use Topics  . Alcohol use: Not Currently    Comment: rare  . Drug use: No    Home Medications Prior to Admission medications   Medication Sig Start Date End Date Taking? Authorizing Provider  ACCU-CHEK GUIDE test strip USE AS INSTRUCTED TO CHECK SUGAR 3 TIMES DAILY DX- E11.42 07/09/18   Philemon Kingdom, MD  Ascorbic Acid (VITAMIN C) 1000 MG tablet Take 1,000 mg by mouth daily.    [provider]  aspirin 325 MG tablet Take 325 mg by mouth daily.      [provider]  carvedilol (COREG) 25 MG tablet TAKE 1 TABLET TWICE DAILY 12/24/18  [name], MD  Cholecalciferol (VITAMIN D3) 125 MCG (5000 UT) TABS Take 5,000 Units by mouth daily.    [provider]  doxycycline (VIBRA-TABS) 100 MG tablet Take 1 tablet (100 mg total) by mouth 2 (two) times daily. 12/29/18   Persons, Bevely Palmer, PA  DROPLET PEN NEEDLES 31G X 5 MM MISC USE TO INJECT INSULIN FOUR TIMES DAILY AS INSTRUCTED (FOR DIABETES) 03/05/19   Philemon Kingdom, MD  insulin aspart (NOVOLOG FLEXPEN) 100 UNIT/ML FlexPen INJECT 16-26 UNITS SUBCUTANEOUSLY THREE TIMES DAILY WITH MEALS Patient taking differently: Inject 25-30 Units into the skin 3 (three) times daily with meals. Sliding Scale Insulin 08/21/18   Philemon Kingdom, MD  Insulin Glargine, 2 Unit Dial, (TOUJEO MAX SOLOSTAR) 300 UNIT/ML SOPN Inject 55 Units into the skin at bedtime. Patient taking differently: Inject 60 Units into the skin at bedtime.  07/30/18   Philemon Kingdom, MD  losartan-hydrochlorothiazide [facility]) 50-12.5 MG tablet TAKE 1 TABLET EVERY DAY 01/16/19   Viviana Simpler I, MD  metFORMIN (GLUCOPHAGE) 1000 MG tablet TAKE 1 TABLET EVERY DAY 03/22/19   Philemon Kingdom, MD  methocarbamol (ROBAXIN) 500 MG tablet Take 1 tablet (500 mg total) by mouth  every 6 (six) hours as needed for muscle spasms. 12/18/18   Love, Ivan Anchors, PA-C  Multiple Vitamin (MULTIVITAMIN WITH MINERALS) TABS tablet Take 1 tablet by mouth daily. Centrum Silver for Men 50+    [provider]  mupirocin ointment (BACTROBAN) 2 % APPLY 1 APPLICATION TOPICALLY DAILY. 03/29/19   [name], MD  Oxycodone HCl 10 MG TABS Take 0.5-1 tablets (5-10 mg total) by mouth every 8 (eight) hours as needed. 12/18/18   Love, Ivan Anchors, PA-C  potassium citrate (UROCIT-K) 10 MEQ (1080 MG) SR tablet Take 20 mEq by mouth 2 (two) times daily.    [provider]  Probiotic Product (PROBIOTIC PO) Take 1 capsule by mouth daily. Probulin Probiotic Supplement    [provider]  zinc gluconate 50 MG tablet Take 50 mg by mouth 2 (two) times daily.     [provider]    Allergies    Atorvastatin, Ezetimibe, and Simvastatin  Review of Systems   Review of Systems  All other systems reviewed and are negative.   Physical Exam Updated Vital Signs BP (!) 154/70   Pulse 81   Temp 98.6 F (37 C) (Oral)   Resp 16   Wt 117.9 kg   SpO2 98%   BMI 33.38 kg/m   Physical Exam Vitals and nursing note reviewed.  Constitutional:      General: He is not in acute distress.    Appearance: Normal appearance. He is not ill-appearing, toxic-appearing or diaphoretic.  HENT:     Head: Normocephalic and atraumatic.  Cardiovascular:     Rate and Rhythm: Normal rate and regular rhythm.  Pulmonary:     Effort: Pulmonary effort is normal. No respiratory distress.     Breath sounds: No rhonchi.  Musculoskeletal:     Comments: Patient is status post bilateral above-the-knee amputations.  His right leg has erythema, but no significant warmth or tenderness to the area of the prior amputation site incision.  There is a small, punctate area where there is serosanguineous fluid occasionally leaking out (see photo).  Skin:    General: Skin is warm and dry.  Neurological:      Mental Status: He is alert.        ED Results / Procedures / Treatments   Labs (all  labs ordered are listed, but only abnormal results are displayed) Labs Reviewed  BASIC METABOLIC PANEL  CBC WITH DIFFERENTIAL/PLATELET    EKG None  Radiology No results found.  Procedures Procedures (including critical care time)  Medications Ordered in ED Medications  clindamycin (CLEOCIN) IVPB 600 mg (has no administration in time range)    ED Course  I have reviewed the triage vital signs and the nursing notes.  Pertinent labs & imaging results that were available during my care of the patient were reviewed by me and considered in my medical decision making (see chart for details).    MDM Rules/Calculators/A&P  Patient with history of above-the-knee amputation of the right leg presenting with fluid draining for the past 3 days.  I suspect this is most likely a seroma at the incision site, but there is some overlying erythema that could potentially represent cellulitis.  Patient was given IV clindamycin.  He was sent for x-rays of the stump which showed an abnormality which could be related to postsurgical change or possibly bony infection.  This finding was discussed with Dr. Erlinda Hong from orthopedics.  Patient will be discharged with clindamycin and follow-up with his surgeon, Dr. [name] in the next few days.  Patient is otherwise afebrile.  He does have a slight leukocytosis, but appears nontoxic.  Final Clinical Impression(s) / ED Diagnoses Final diagnoses:  None    Rx / DC Orders ED Discharge Orders    None       Veryl Speak, MD 04/05/19 [phone]

## 2019-04-05 NOTE — Discharge Instructions (Signed)
Begin taking clindamycin as prescribed.  Follow-up with Dr. [name] in the next 2 days.  Call the office this morning to make these arrangements.  Return to the emergency department if your symptoms significantly worsen or change.

## 2019-04-07 ENCOUNTER — Ambulatory Visit (INDEPENDENT_AMBULATORY_CARE_PROVIDER_SITE_OTHER): Payer: Medicare Other | Admitting: Internal Medicine

## 2019-04-07 ENCOUNTER — Encounter: Payer: Self-pay | Admitting: Internal Medicine

## 2019-04-07 ENCOUNTER — Other Ambulatory Visit: Payer: Self-pay

## 2019-04-07 VITALS — BP 118/80 | HR 97 | Temp 97.3°F

## 2019-04-07 DIAGNOSIS — Z794 Long term (current) use of insulin: Secondary | ICD-10-CM | POA: Diagnosis not present

## 2019-04-07 DIAGNOSIS — I10 Essential (primary) hypertension: Secondary | ICD-10-CM | POA: Diagnosis not present

## 2019-04-07 DIAGNOSIS — E1142 Type 2 diabetes mellitus with diabetic polyneuropathy: Secondary | ICD-10-CM

## 2019-04-07 DIAGNOSIS — I2510 Atherosclerotic heart disease of native coronary artery without angina pectoris: Secondary | ICD-10-CM

## 2019-04-07 DIAGNOSIS — Z0000 Encounter for general adult medical examination without abnormal findings: Secondary | ICD-10-CM

## 2019-04-07 DIAGNOSIS — S78111A Complete traumatic amputation at level between right hip and knee, initial encounter: Secondary | ICD-10-CM

## 2019-04-07 DIAGNOSIS — Z1211 Encounter for screening for malignant neoplasm of colon: Secondary | ICD-10-CM | POA: Diagnosis not present

## 2019-04-07 DIAGNOSIS — G4733 Obstructive sleep apnea (adult) (pediatric): Secondary | ICD-10-CM | POA: Diagnosis not present

## 2019-04-07 DIAGNOSIS — Z89612 Acquired absence of left leg above knee: Secondary | ICD-10-CM | POA: Diagnosis not present

## 2019-04-07 DIAGNOSIS — Z7189 Other specified counseling: Secondary | ICD-10-CM

## 2019-04-07 LAB — HM DIABETES FOOT EXAM

## 2019-04-07 NOTE — Progress Notes (Signed)
Subjective:    Patient ID: [name], male    DOB: [birth year], 69 y.o.   MRN: [id number]  HPI Here with girlfriend, [name] for Medicare wellness visit and follow up of chronic health conditions This visit occurred during the SARS-CoV-2 public health emergency.  Safety protocols were in place, including screening questions prior to the visit, additional usage of staff PPE, and extensive cleaning of exam room while observing appropriate contact time as indicated for disinfecting solutions.   Reviewed form and advanced directives Reviewed other doctors No alcohol or tobacco Is doing home exercise program--leg stumps and upper body No falls No depression or anhedonia Vision is okay Poor hearing--not interested in hearing aides No problems with memory  Doing okay since right AKA--in December Transfers on slide board mostly Showers himself on shower chair Dresses himself and bathroom  Helps out in house---cooks, dishes. Girlfriend does laundry, vacuuming, etc No major pain issues Did have some bleeding from right stump---was in ER 3 days ago Got put on clindamycin after IV antibiotic Draining still ---serosanguinous Not considering prostheses----is waiting for power wheelchair (left shoulder is weak)  Has been seeing Dr [name] for the diabetes Was doing well till the leg decompensated He feels it is better now---adjusting insulin Chronic neuropathy--still in hands (cold but not really painful)  No chest pain or SOB No dizziness or syncope No palpitations No headaches  Current Outpatient Medications on File Prior to Visit  Medication Sig Dispense Refill  . ACCU-CHEK GUIDE test strip USE AS INSTRUCTED TO CHECK SUGAR 3 TIMES DAILY DX- E11.42 200 strip 8  . Ascorbic Acid (VITAMIN C) 1000 MG tablet Take 1,000 mg by mouth daily.    Marland Kitchen aspirin 325 MG tablet Take 325 mg by mouth daily.      . carvedilol (COREG) 25 MG tablet TAKE 1 TABLET TWICE DAILY 180 tablet 3  . Cholecalciferol  (VITAMIN D3) 125 MCG (5000 UT) TABS Take 5,000 Units by mouth daily.    . clindamycin (CLEOCIN) 300 MG capsule Take 1 capsule (300 mg total) by mouth 4 (four) times daily. X 7 days 28 capsule 0  . DROPLET PEN NEEDLES 31G X 5 MM MISC USE TO INJECT INSULIN FOUR TIMES DAILY AS INSTRUCTED (FOR DIABETES) 400 each 11  . insulin aspart (NOVOLOG FLEXPEN) 100 UNIT/ML FlexPen INJECT 16-26 UNITS SUBCUTANEOUSLY THREE TIMES DAILY WITH MEALS (Patient taking differently: Inject 25-30 Units into the skin 3 (three) times daily with meals. Sliding Scale Insulin) 20 pen 3  . Insulin Glargine, 2 Unit Dial, (TOUJEO MAX SOLOSTAR) 300 UNIT/ML SOPN Inject 55 Units into the skin at bedtime. (Patient taking differently: Inject 60 Units into the skin at bedtime. ) 9 pen 3  . losartan-hydrochlorothiazide (HYZAAR) 50-12.5 MG tablet TAKE 1 TABLET EVERY DAY 90 tablet 3  . metFORMIN (GLUCOPHAGE) 1000 MG tablet TAKE 1 TABLET EVERY DAY (Patient taking differently: 2,000 mg. ) 90 tablet 1  . Multiple Vitamin (MULTIVITAMIN WITH MINERALS) TABS tablet Take 1 tablet by mouth daily. Centrum Silver for Men 50+    . mupirocin ointment (BACTROBAN) 2 % APPLY 1 APPLICATION TOPICALLY DAILY. 22 g 1  . potassium citrate (UROCIT-K) 10 MEQ (1080 MG) SR tablet Take 20 mEq by mouth 2 (two) times daily.    . Probiotic Product (PROBIOTIC PO) Take 1 capsule by mouth daily. Probulin Probiotic Supplement    . zinc gluconate 50 MG tablet Take 50 mg by mouth 2 (two) times daily.      No current facility-administered  medications on file prior to visit.    Allergies  Allergen Reactions  . Atorvastatin Other (See Comments)    myalgias  . Ezetimibe Other (See Comments)    Body ache  . Simvastatin Other (See Comments)    Body ache    Past Medical History:  Diagnosis Date  . Cellulitis and abscess of left lower extremity 12/20/2017  . Coronary atherosclerosis of unspecified type of vessel, native or graft   . Diabetes mellitus without complication  ([place])    type 2  . Hemorrhage of rectum and anus   . History of kidney stones    passed stones  . Impotence of organic origin   . Internal hemorrhoids without mention of complication   . Left below-knee amputee ([place]) 02/23/2016  . Malignant neoplasm of prostate ([place])   . Mononeuritis of unspecified site   . Osteoarthrosis, unspecified whether generalized or localized, unspecified site   . Other and unspecified hyperlipidemia   . Peripheral vascular disease ([place])   . Personal history of colonic polyps   . Personal history of diabetic foot ulcer    saw wound center, resolved 05/08/2010  . Personal history of gallstones   . Routine general medical examination at a health care facility   . Subacute osteomyelitis, right ankle and foot ([place])   . Type II or unspecified type diabetes mellitus with neurological manifestations, not stated as uncontrolled(250.60)   . Unspecified glaucoma(365.9)   . Unspecified sleep apnea    cpcp  . Unspecified venous (peripheral) insufficiency     Past Surgical History:  Procedure Laterality Date  . AMPUTATION Left 12/30/2012   Procedure: AMPUTATION RAY ;  Surgeon: Meredith Pel, MD;  [facility];  Service: Orthopedics;  Laterality: Left;  LEFT GREAT TOE RAY AMPUTATION  . AMPUTATION Left 02/23/2016   Procedure: Left Below Knee Amputation;  Surgeon: [name], MD;  Location: [place];  Service: Orthopedics;  Laterality: Left;  . AMPUTATION Left 12/24/2017   Procedure: LEFT ABOVE KNEE AMPUTATION;  Surgeon: [name], MD;  Location: [place];  Service: Orthopedics;  Laterality: Left;  . AMPUTATION Right 12/24/2017   Procedure: RIGHT 5TH RAY AMPUTATION;  Surgeon: [name], MD;  Location: [place];  Service: Orthopedics;  Laterality: Right;  . AMPUTATION Right 12/11/2018   Procedure: RIGHT ABOVE KNEE AMPUTATION;  Surgeon: [name], MD;  Location: [place];  Service: Orthopedics;  Laterality: Right;  . APPENDECTOMY    . BASAL CELL CARCINOMA  EXCISION  2/16   left forearm  . BELPHAROPTOSIS REPAIR    . CARDIAC CATHETERIZATION  1998   Negative  . CATARACT EXTRACTION Right 2017   then lid surgery  . COLONOSCOPY    . CORONARY ARTERY BYPASS GRAFT  09/2005   5 bypasses per patient, Post op AFIB  . EYE SURGERY    . FOOT BONE EXCISION Left 11/03/2013   DR DUDA   . I & D EXTREMITY Left 11/03/2013   Procedure: Left Foot Partial Bone Excision Cuboid and Medial Cuneiform, Wound Closures;  Surgeon: [name], MD;  Location: [place];  Service: Orthopedics;  Laterality: Left;  . INSERTION PROSTATE RADIATION SEED  2009   RT and seeds for prostate cancer  . KIDNEY STONE SURGERY  04/1993  . RCA stents  04/2003   EF 55%  . RETINAL DETACHMENT SURGERY  2002-2003  . thrombosed vein  1993   Right leg    Family History  Problem Relation Age of  Onset  . Lung cancer Father   . Multiple sclerosis Mother   . Heart attack Other        paternal aunts and uncles  . Peripheral vascular disease Maternal Grandfather        several amputations    Social History   Socioeconomic History  . Marital status: Widowed    Spouse name: Not on file  . Number of children: 3  . Years of education: Not on file  . Highest education level: Not on file  Occupational History  . Occupation: Heavy equipment mechanic--retired  Tobacco Use  . Smoking status: Former Smoker    Packs/day: 2.00    Years: 35.00    Pack years: 70.00    Types: Cigars, Cigarettes    Quit date: 01/07/2001    Years since quitting: 18.2  . Smokeless tobacco: Never Used  Substance and Sexual Activity  . Alcohol use: Not Currently    Comment: rare  . Drug use: No  . Sexual activity: Yes    Partners: Female  Other Topics Concern  . Not on file  Social History Narrative   Has living will   Daughter [name] is  health care POA    Would accept resuscitation attempts--but no prolonged artificial ventilation   No tube feeds if cognitively unaware   Social Determinants of  Health   Financial Resource Strain:   . Difficulty of Paying Living Expenses:   Food Insecurity:   . Worried About Charity fundraiser in the Last Year:   . Arboriculturist in the Last Year:   Transportation Needs:   . Film/video editor (Medical):   Marland Kitchen Lack of Transportation (Non-Medical):   Physical Activity:   . Days of Exercise per Week:   . Minutes of Exercise per Session:   Stress:   . Feeling of Stress :   Social Connections:   . Frequency of Communication with Friends and Family:   . Frequency of Social Gatherings with Friends and Family:   . Attends Religious Services:   . Active Member of Clubs or Organizations:   . Attends Archivist Meetings:   Marland Kitchen Marital Status:   Intimate Partner Violence:   . Fear of Current or Ex-Partner:   . Emotionally Abused:   Marland Kitchen Physically Abused:   . Sexually Abused:    Review of Systems Appetite is good Weight is fairly stable Sleeps okay No recent teeth problems--hasn't been for exam recently Wears seat belt in car--not driving now No heartburn or dysphagia Bowels are okay--no blood (other than hemorrhoid at times) Voids okay. Nocturia x 1 at most Missed urology visit for history of prostate cancer---scheduled this year No sig back pain. Occasional shoulder pain--uses aleve prn No rash or suspicious skin lesions     Objective:   Physical Exam  Constitutional: He is oriented to person, place, and time. He appears well-developed. No distress.  HENT:  No oral lesions  Neck: No thyromegaly present.  Cardiovascular: Normal rate, regular rhythm and normal heart sounds. Exam reveals no gallop.  No murmur heard. Respiratory: Effort normal and breath sounds normal. No respiratory distress. He has no wheezes. He has no rales.  GI: Soft. There is no abdominal tenderness.  Musculoskeletal:     Comments: Bilateral AKA  Lymphadenopathy:    He has no cervical adenopathy.  Neurological: He is alert and oriented to person,  place, and time.  President--- "Zoila Shutter, Obama" [phone] [id number] Recall 3/3  Skin:  Small open area at end of right AKA. Not really inflamed  Psychiatric: He has a normal mood and affect. His behavior is normal.           Assessment & Plan:

## 2019-04-07 NOTE — Assessment & Plan Note (Signed)
BP Readings from Last 3 Encounters:  04/07/19 118/80  04/05/19 138/77  01/28/19 120/60   Good control

## 2019-04-07 NOTE — Assessment & Plan Note (Signed)
Uses the CPAP every night with success and restful sleep

## 2019-04-07 NOTE — Assessment & Plan Note (Signed)
I have personally reviewed the Medicare Annual Wellness questionnaire and have noted 1. The patient's medical and social history 2. Their use of alcohol, tobacco or illicit drugs 3. Their current medications and supplements 4. The patient's functional ability including ADL's, fall risks, home safety risks and hearing or visual             impairment. 5. Diet and physical activities 6. Evidence for depression or mood disorders  The patients weight, height, BMI and visual acuity have been recorded in the chart I have made referrals, counseling and provided education to the patient based review of the above and I have provided the pt with a written personalized care plan for preventive services.  I have provided you with a copy of your personalized plan for preventive services. Please take the time to review along with your updated medication list.  Had COVID vaccines Discussed working on upper body strength Flu vaccine in the fall Consider shingrix at the pharmacy PSA monitored due to past cancer Will do FIT again

## 2019-04-07 NOTE — Assessment & Plan Note (Signed)
Probably had seroma which drained Would finish the clinda due to possible infection Will be getting power wheelchair

## 2019-04-07 NOTE — Assessment & Plan Note (Signed)
Hopefully better control Will be going back to Dr [name] Mild symptoms in hands-no Rx needed

## 2019-04-07 NOTE — Assessment & Plan Note (Signed)
No angina 

## 2019-04-07 NOTE — Assessment & Plan Note (Signed)
See social history 

## 2019-04-07 NOTE — Assessment & Plan Note (Signed)
No problems with that stump Adjusting to being double amputee

## 2019-04-08 ENCOUNTER — Encounter: Payer: Self-pay | Admitting: Orthopedic Surgery

## 2019-04-08 ENCOUNTER — Ambulatory Visit (INDEPENDENT_AMBULATORY_CARE_PROVIDER_SITE_OTHER): Payer: Medicare Other | Admitting: Orthopedic Surgery

## 2019-04-08 VITALS — Ht 74.0 in | Wt 260.0 lb

## 2019-04-08 DIAGNOSIS — Z89612 Acquired absence of left leg above knee: Secondary | ICD-10-CM

## 2019-04-08 DIAGNOSIS — I2510 Atherosclerotic heart disease of native coronary artery without angina pectoris: Secondary | ICD-10-CM

## 2019-04-08 DIAGNOSIS — Z89611 Acquired absence of right leg above knee: Secondary | ICD-10-CM

## 2019-04-10 ENCOUNTER — Telehealth: Payer: Self-pay

## 2019-04-10 DIAGNOSIS — E1142 Type 2 diabetes mellitus with diabetic polyneuropathy: Secondary | ICD-10-CM

## 2019-04-10 DIAGNOSIS — Z794 Long term (current) use of insulin: Secondary | ICD-10-CM

## 2019-04-10 DIAGNOSIS — I10 Essential (primary) hypertension: Secondary | ICD-10-CM

## 2019-04-10 NOTE — Telephone Encounter (Signed)
I would like to request a referral for [name] to chronic care management pharmacy services for the following conditions:   Essential hypertension, benign  [I10]  Type 2 diabetes mellitus with peripheral neuropathy [E11.42]  [name], PharmD Clinical Pharmacist [facility] [phone]

## 2019-04-12 ENCOUNTER — Encounter: Payer: Self-pay | Admitting: Orthopedic Surgery

## 2019-04-12 NOTE — Progress Notes (Signed)
Office Visit Note   Patient: [name]           Date of Birth: 1950-12-12           MRN: [id number] Visit Date: 04/08/2019              Requested by: [name], MD [place],  [place] [address] PCP: [name], MD  No chief complaint on file.     HPI: Patient is a 69 year old gentleman who presents almost 4 months status post right above-the-knee amputation.  Patient states he went to the emergency room was given IV antibiotic and has been on oral antibiotic for a week he denies any drainage denies any tenderness, currently on doxycycline.  Assessment & Plan: Visit Diagnoses:  1. Status post above knee amputation, left ([place])   2. S/P AKA (above knee amputation), right ([place])     Plan: Patient will complete his antibiotics no signs of infection at this time reevaluate in 4 weeks  Follow-Up Instructions: Return in about 4 weeks (around 05/06/2019).   Ortho Exam  Patient is alert, oriented, no adenopathy, well-dressed, normal affect, normal respiratory effort. Examination there is no redness no cellulitis no drainage no tenderness to palpation the right above-knee amputation appears stable.  Imaging: No results found. No images are attached to the encounter.  Labs: Lab Results  Component Value Date   HGBA1C 8.6 (H) 12/11/2018   HGBA1C 6.4 (A) 12/12/2017   HGBA1C 7.3 (A) 08/05/2017   ESRSEDRATE 35 (H) 03/31/2013   CRP 5.3 (H) 03/31/2013   REPTSTATUS 12/25/2017 FINAL 12/20/2017   GRAMSTAIN  12/30/2012    FEW WBC PRESENT, PREDOMINANTLY PMN RARE SQUAMOUS EPITHELIAL CELLS PRESENT ABUNDANT GRAM NEGATIVE RODS MODERATE GRAM POSITIVE COCCI IN PAIRS IN CLUSTERS   CULT  12/20/2017    NO GROWTH 5 DAYS Performed at [facility], [place] [address], [place], [place] [address]    LABORGA KLEBSIELLA OXYTOCA 12/15/2012   LABORGA PSEUDOMONAS AERUGINOSA 12/15/2012     Lab Results  Component Value Date   ALBUMIN 2.9 (L) 12/15/2018   ALBUMIN 2.6 (L) 12/27/2017   ALBUMIN 2.7 (L) 12/26/2017    No results found for: MG No results found for: VD25OH  No results found for: PREALBUMIN CBC EXTENDED Latest Ref Rng & Units 04/05/2019 12/15/2018 12/13/2018  WBC 4.0 - 10.5 K/uL 13.1(H) 10.1 10.5  RBC 4.22 - 5.81 MIL/uL 6.35(H) 4.07(L) 4.09(L)  HGB 13.0 - 17.0 g/dL 15.9 11.2(L) 11.1(L)  HCT 39.0 - 52.0 % 50.0 33.9(L) 34.0(L)  PLT 150 - 400 K/uL 382 282 257  NEUTROABS 1.7 - 7.7 K/uL 10.1(H) 7.5 -  LYMPHSABS 0.7 - 4.0 K/uL 1.4 1.2 -     Body mass index is 33.38 kg/m.  Orders:  No orders of the defined types were placed in this encounter.  No orders of the defined types were placed in this encounter.    Procedures: No procedures performed  Clinical Data: No additional findings.  ROS:  All other systems negative, except as noted in the HPI. Review of Systems  Objective: Vital Signs: Ht 6\' 2"  (1.88 m)   Wt 260 lb (117.9 kg)   BMI 33.38 kg/m   Specialty Comments:  No specialty comments available.  PMFS History: Patient Active Problem List   Diagnosis Date Noted  . Above knee amputation of right lower extremity ([place]) 12/14/2018  . Type 2 diabetes mellitus with peripheral neuropathy (H[facility])   . Left above-knee amputee (  [place]) 12/26/2017  . History of complete ray amputation of fifth toe of right foot ([place])   . Essential hypertension   . Chronic renal disease, stage III 02/19/2017  . Ulcer of toe of right foot, limited to breakdown of skin ([place]) 10/11/2016  . Type 2 diabetes mellitus with diabetic polyneuropathy, with long-term current use of insulin ([place]) 03/29/2015  . Advance directive discussed with patient 02/07/2014  . Leucocytosis 04/01/2013  . Routine general medical examination at a health care facility 12/25/2010  . PERSONAL HX COLONIC POLYPS 11/22/2009  . Venous (peripheral) insufficiency 06/29/2008  . Osteoarthritis, multiple sites 04/03/2007  . ERECTILE DYSFUNCTION, ORGANIC 06/13/2006  . GLAUCOMA  06/11/2006  . Coronary atherosclerosis of native coronary artery 06/11/2006  . Obstructive sleep apnea 06/11/2006   Past Medical History:  Diagnosis Date  . Cellulitis and abscess of left lower extremity 12/20/2017  . Coronary atherosclerosis of unspecified type of vessel, native or graft   . Diabetes mellitus without complication ([place])    type 2  . Hemorrhage of rectum and anus   . History of kidney stones    passed stones  . Impotence of organic origin   . Internal hemorrhoids without mention of complication   . Left below-knee amputee ([place]) 02/23/2016  . Malignant neoplasm of prostate ([place])   . Mononeuritis of unspecified site   . Osteoarthrosis, unspecified whether generalized or localized, unspecified site   . Other and unspecified hyperlipidemia   . Peripheral vascular disease ([place])   . Personal history of colonic polyps   . Personal history of diabetic foot ulcer    saw wound center, resolved 05/08/2010  . Personal history of gallstones   . Routine general medical examination at a health care facility   . Subacute osteomyelitis, right ankle and foot ([place])   . Type II or unspecified type diabetes mellitus with neurological manifestations, not stated as uncontrolled(250.60)   . Unspecified glaucoma(365.9)   . Unspecified sleep apnea    cpcp  . Unspecified venous (peripheral) insufficiency     Family History  Problem Relation Age of Onset  . Lung cancer Father   . Multiple sclerosis Mother   . Heart attack Other        paternal aunts and uncles  . Peripheral vascular disease Maternal Grandfather        several amputations    Past Surgical History:  Procedure Laterality Date  . AMPUTATION Left 12/30/2012   Procedure: AMPUTATION RAY ;  Surgeon: Meredith Pel, MD;  Location: [facility];  Service: Orthopedics;  Laterality: Left;  LEFT GREAT TOE RAY AMPUTATION  . AMPUTATION Left 02/23/2016   Procedure: Left Below Knee Amputation;  Surgeon: [name], MD;  Location: [facility];  Service: Orthopedics;  Laterality: Left;  . AMPUTATION Left 12/24/2017   Procedure: LEFT ABOVE KNEE AMPUTATION;  Surgeon: [name], MD;  Location: [facility];  Service: Orthopedics;  Laterality: Left;  . AMPUTATION Right 12/24/2017   Procedure: RIGHT 5TH RAY AMPUTATION;  Surgeon: [name], MD;  Location: [facility];  Service: Orthopedics;  Laterality: Right;  . AMPUTATION Right 12/11/2018   Procedure: RIGHT ABOVE KNEE AMPUTATION;  Surgeon: [name], MD;  Location: [facility];  Service: Orthopedics;  Laterality: Right;  . APPENDECTOMY    . BASAL CELL CARCINOMA EXCISION  2/16   left forearm  . BELPHAROPTOSIS REPAIR    . CARDIAC CATHETERIZATION  1998   Negative  . CATARACT EXTRACTION Right 2017   then lid  surgery  . COLONOSCOPY    . CORONARY ARTERY BYPASS GRAFT  09/2005   5 bypasses per patient, Post op AFIB  . EYE SURGERY    . FOOT BONE EXCISION Left 11/03/2013   DR Haja Crego   . I & D EXTREMITY Left 11/03/2013   Procedure: Left Foot Partial Bone Excision Cuboid and Medial Cuneiform, Wound Closures;  Surgeon: [name], MD;  Location: [facility];  Service: Orthopedics;  Laterality: Left;  . INSERTION PROSTATE RADIATION SEED  2009   RT and seeds for prostate cancer  . KIDNEY STONE SURGERY  04/1993  . RCA stents  04/2003   EF 55%  . RETINAL DETACHMENT SURGERY  2002-2003  . thrombosed vein  1993   Right leg   Social History   Occupational History  . Occupation: Heavy equipment mechanic--retired  Tobacco Use  . Smoking status: Former Smoker    Packs/day: 2.00    Years: 35.00    Pack years: 70.00    Types: Cigars, Cigarettes    Quit date: 01/07/2001    Years since quitting: 18.2  . Smokeless tobacco: Never Used  Substance and Sexual Activity  . Alcohol use: Not Currently    Comment: rare  . Drug use: No  . Sexual activity: Yes    Partners: Female

## 2019-04-12 NOTE — Telephone Encounter (Signed)
Referral created.

## 2019-04-13 ENCOUNTER — Other Ambulatory Visit: Payer: Self-pay

## 2019-04-13 ENCOUNTER — Ambulatory Visit: Payer: Medicare Other

## 2019-04-13 DIAGNOSIS — M8949 Other hypertrophic osteoarthropathy, multiple sites: Secondary | ICD-10-CM

## 2019-04-13 DIAGNOSIS — I2510 Atherosclerotic heart disease of native coronary artery without angina pectoris: Secondary | ICD-10-CM

## 2019-04-13 DIAGNOSIS — M159 Polyosteoarthritis, unspecified: Secondary | ICD-10-CM

## 2019-04-13 DIAGNOSIS — H409 Unspecified glaucoma: Secondary | ICD-10-CM

## 2019-04-13 DIAGNOSIS — G4733 Obstructive sleep apnea (adult) (pediatric): Secondary | ICD-10-CM

## 2019-04-13 DIAGNOSIS — E1142 Type 2 diabetes mellitus with diabetic polyneuropathy: Secondary | ICD-10-CM

## 2019-04-13 DIAGNOSIS — N529 Male erectile dysfunction, unspecified: Secondary | ICD-10-CM

## 2019-04-13 DIAGNOSIS — M150 Primary generalized (osteo)arthritis: Secondary | ICD-10-CM

## 2019-04-13 DIAGNOSIS — I10 Essential (primary) hypertension: Secondary | ICD-10-CM

## 2019-04-13 DIAGNOSIS — N183 Chronic kidney disease, stage 3 unspecified: Secondary | ICD-10-CM

## 2019-04-13 NOTE — Patient Instructions (Signed)
April 13, 2019  Dear [name],  It was a pleasure meeting you during our initial appointment on April 13, 2019. Below is a summary of the goals we discussed and components of chronic care management. Please contact me anytime with questions or concerns.   Visit Information  Goals Addressed            This Visit's Progress   . Pharmacy Care Plan       CARE PLAN ENTRY  Current Barriers:  . Chronic Disease Management support, education, and care coordination needs related to  CAD, hypertension, sleep apnea, type 2 diabetes, neuropathy, osteoarthritis, chronic renal disease, glaucoma, history of prostate cancer   Pharmacist Clinical Goal(s):  Marland Kitchen Maintain blood glucose control with goal A1c less than 7%. Continue current medications  . Prevent adverse drug effects. Recommend reducing aspirin from 325 to 81 mg for cardiovascular protection per Dr. Alla German recommendation. No additional benefit is seen with higher dose aspirin.  . Remain up to date on vaccination. Recommend checking with your pharmacy for shingles vaccine (Shingrix).  Interventions: . Comprehensive medication review performed . Reduce aspirin to 81 mg daily  . Reviewed kidney function and drug interactions for safety  Patient Self Care Activities:  . Self administers medications as prescribed . Self-monitors blood glucose  Initial goal documentation      [name] was given information about Chronic Care Management services today including:  1. CCM service includes personalized support from designated clinical staff supervised by his physician, including individualized plan of care and coordination with other care providers 2. 24/7 contact phone numbers for assistance for urgent and routine care needs. 3. Standard insurance, coinsurance, copays and deductibles apply for chronic care management only during months in which we provide at least 20 minutes of these services. Most insurances cover these services at 100%,  however patients may be responsible for any copay, coinsurance and/or deductible if applicable. This service may help you avoid the need for more expensive face-to-face services. 4. Only one practitioner may furnish and bill the service in a calendar month. 5. The patient may stop CCM services at any time (effective at the end of the month) by phone call to the office staff.  Patient agreed to services and verbal consent obtained.   The patient verbalized understanding of instructions provided today and agreed to receive a mailed copy of patient instruction and/or educational materials. Telephone follow up appointment with pharmacy team member scheduled for: 11/23/19 at 2:00 PM (telephone)  [name], PharmD Clinical Pharmacist [facility] [phone]  Zoster Vaccine, Recombinant injection What is this medicine? ZOSTER VACCINE (ZOS ter vak SEEN) is used to prevent shingles in adults 71 years old and over. This vaccine is not used to treat shingles or nerve pain from shingles. This medicine may be used for other purposes; ask your health care provider or pharmacist if you have questions. COMMON BRAND NAME(S): [facility] What should I tell my health care provider before I take this medicine? They need to know if you have any of these conditions:  blood disorders or disease  cancer like leukemia or lymphoma  immune system problems or therapy  an unusual or allergic reaction to vaccines, other medications, foods, dyes, or preservatives  pregnant or trying to get pregnant  breast-feeding How should I use this medicine? This vaccine is for injection in a muscle. It is given by a health care professional. Talk to your pediatrician regarding the use of this medicine  in children. This medicine is not approved for use in children. Overdosage: If you think you have taken too much of this medicine contact a poison control center or emergency room at once. NOTE:  This medicine is only for you. Do not share this medicine with others. What if I miss a dose? Keep appointments for follow-up (booster) doses as directed. It is important not to miss your dose. Call your doctor or health care professional if you are unable to keep an appointment. What may interact with this medicine?  medicines that suppress your immune system  medicines to treat cancer  steroid medicines like prednisone or cortisone This list may not describe all possible interactions. Give your health care provider a list of all the medicines, herbs, non-prescription drugs, or dietary supplements you use. Also tell them if you smoke, drink alcohol, or use illegal drugs. Some items may interact with your medicine. What should I watch for while using this medicine? Visit your doctor for regular check ups. This vaccine, like all vaccines, may not fully protect everyone. What side effects may I notice from receiving this medicine? Side effects that you should report to your doctor or health care professional as soon as possible:  allergic reactions like skin rash, itching or hives, swelling of the face, lips, or tongue  breathing problems Side effects that usually do not require medical attention (report these to your doctor or health care professional if they continue or are bothersome):  chills  headache  fever  nausea, vomiting  redness, warmth, pain, swelling or itching at site where injected  tiredness This list may not describe all possible side effects. Call your doctor for medical advice about side effects. You may report side effects to FDA at 1-800-FDA-1088. Where should I keep my medicine? This vaccine is only given in a clinic, pharmacy, doctor's office, or other health care setting and will not be stored at home. NOTE: This sheet is a summary. It may not cover all possible information. If you have questions about this medicine, talk to your doctor, pharmacist, or health  care provider.  2020 Elsevier/Gold Standard (2016-08-05 13:20:30)

## 2019-04-13 NOTE — Chronic Care Management (AMB) (Signed)
Chronic Care Management Pharmacy  Name: [name]  MRN: [id number] DOB: 11-02-1950  Chief Complaint/ HPI  [name],  69 y.o. , male presents for their Initial CCM visit with the clinical pharmacist via telephone.  PCP : [name], MD  Their chronic conditions include: CAD, hypertension, sleep apnea, type 2 diabetes, neuropathy, osteoarthritis, chronic renal disease, glaucoma, history of prostate cancer   Patient concerns: denies medication concerns, doing well  Office Visits: 04/07/19: [name] - diabetes uncontrolled, going back to specialist  Consult Visit: 02/04/19: [name] -  Osteoarthritis, no medication changes  01/28/19: [name] - Increase metformin to 2000 mg qPM  Allergies  Allergen Reactions  . Atorvastatin Other (See Comments)    myalgias  . Ezetimibe Other (See Comments)    Body ache  . Simvastatin Other (See Comments)    Body ache   Medications: Outpatient Encounter Medications as of 04/13/2019  Medication Sig  . ACCU-CHEK GUIDE test strip USE AS INSTRUCTED TO CHECK SUGAR 3 TIMES DAILY DX- E11.42  . Ascorbic Acid (VITAMIN C) 1000 MG tablet Take 1,000 mg by mouth daily.  Marland Kitchen aspirin 325 MG tablet Take 325 mg by mouth daily.    . carvedilol (COREG) 25 MG tablet TAKE 1 TABLET TWICE DAILY  . Cholecalciferol (VITAMIN D3) 125 MCG (5000 UT) TABS Take 5,000 Units by mouth daily.  . clindamycin (CLEOCIN) 300 MG capsule Take 1 capsule (300 mg total) by mouth 4 (four) times daily. X 7 days  . DROPLET PEN NEEDLES 31G X 5 MM MISC USE TO INJECT INSULIN FOUR TIMES DAILY AS INSTRUCTED (FOR DIABETES)  . insulin aspart (NOVOLOG FLEXPEN) 100 UNIT/ML FlexPen INJECT 16-26 UNITS SUBCUTANEOUSLY THREE TIMES DAILY WITH MEALS (Patient taking differently: Inject 25-30 Units into the skin 3 (three) times daily with meals. Sliding Scale Insulin)  . Insulin Glargine, 2 Unit Dial, (TOUJEO MAX SOLOSTAR) 300 UNIT/ML SOPN Inject 55 Units into the skin at bedtime. (Patient taking  differently: Inject 60 Units into the skin at bedtime. )  . losartan-hydrochlorothiazide (HYZAAR) 50-12.5 MG tablet TAKE 1 TABLET EVERY DAY  . metFORMIN (GLUCOPHAGE) 1000 MG tablet TAKE 1 TABLET EVERY DAY (Patient taking differently: 2,000 mg. )  . Multiple Vitamin (MULTIVITAMIN WITH MINERALS) TABS tablet Take 1 tablet by mouth daily. Centrum Silver for Men 50+  . mupirocin ointment (BACTROBAN) 2 % APPLY 1 APPLICATION TOPICALLY DAILY.  Marland Kitchen potassium citrate (UROCIT-K) 10 MEQ (1080 MG) SR tablet Take 20 mEq by mouth 2 (two) times daily.  . Probiotic Product (PROBIOTIC PO) Take 1 capsule by mouth daily. Probulin Probiotic Supplement  . zinc gluconate 50 MG tablet Take 50 mg by mouth 2 (two) times daily.    No facility-administered encounter medications on file as of 04/13/2019.   Current Diagnosis/Assessment: Goals    . Pharmacy Care Plan     CARE PLAN ENTRY  Current Barriers:  . Chronic Disease Management support, education, and care coordination needs related to  CAD, hypertension, sleep apnea, type 2 diabetes, neuropathy, osteoarthritis, chronic renal disease, glaucoma, history of prostate cancer   Pharmacist Clinical Goal(s):  Marland Kitchen Maintain blood glucose control with goal A1c less than 7%. Continue current medications  . Prevent adverse drug effects. Recommend reducing aspirin from 325 to 81 mg for cardiovascular protection per [name] recommendation. No additional benefit is seen with higher dose aspirin.  . Remain up to date on vaccination. Recommend checking with your pharmacy for shingles vaccine (Shingrix).  Interventions: . Comprehensive medication review performed .  Reduce aspirin to 81 mg daily  . Reviewed kidney function and drug interactions for safety  Patient Self Care Activities:  . Self administers medications as prescribed . Self-monitors blood glucose  Initial goal documentation      Diabetes   Recent Relevant Labs: Lab Results  Component Value Date/Time    HGBA1C 8.6 (H) 12/11/2018 05:58 AM   HGBA1C 6.4 (A) 12/12/2017 10:49 AM   HGBA1C 7.3 (A) 08/05/2017 02:11 PM   HGBA1C 6.1 02/14/2016 11:26 AM   MICROALBUR 2.8 (H) 12/25/2010 10:39 AM   MICROALBUR 1.3 12/15/2009 09:07 AM    Checking BG: 3-4 times daily  Recent FBG Readings: 125-170 Recent pre-meal BG readings: 125-170 Bedtime: 125-170 Hypoglycemia: denies   Patient has failed these meds in past: none reported Patient is currently controlled on the following medications:   metFORMIN (GLUCOPHAGE) 1000 MG tablet - 2 tablets with dinner  insulin aspart (NOVOLOG FLEXPEN) 100 UNIT/ML FlexPen - 25-30 units before meals (30 if heavier meal)  Insulin Glargine, 2 Unit Dial, (TOUJEO MAX SOLOSTAR) 300 UNIT/ML SOPN - 56 units at bedtime   Last diabetic eye exam:  Lab Results  Component Value Date/Time   HMDIABEYEEXA No Retinopathy 01/12/2016 12:00 AM    Last diabetic foot exam: double leg amputation  We discussed: BG is much improved with metformin dose increase   Plan: Continue current medications  Hypertension   CMP Latest Ref Rng & Units 04/05/2019 12/15/2018 12/11/2018  Glucose 70 - 99 mg/dL 141(H) 192(H) 197(H)  BUN 8 - 23 mg/dL 33(H) 22 24(H)  Creatinine 0.61 - 1.24 mg/dL 1.07 0.87 1.08  Sodium 135 - 145 mmol/L 139 139 136  Potassium 3.5 - 5.1 mmol/L 4.7 4.3 4.3  Chloride 98 - 111 mmol/L 101 101 103  CO2 22 - 32 mmol/L 22 25 23   Calcium 8.9 - 10.3 mg/dL 9.2 9.0 8.9  Total Protein 6.5 - 8.1 g/dL - 6.1(L) -  Total Bilirubin 0.3 - 1.2 mg/dL - 1.0 -  Alkaline Phos 38 - 126 U/L - 48 -  AST 15 - 41 U/L - 17 -  ALT 0 - 44 U/L - 12 -   Office blood pressures are: BP Readings from Last 3 Encounters:  04/07/19 118/80  04/05/19 138/77  01/28/19 120/60   BP goal < 140/90 mmHg Patient has failed these meds in the past: none reported Patient checks BP at home: does not have a monitor at home Patient home BP readings are ranging: none reported  Patient is currently controlled on  the following medications:   carvedilol (COREG) 25 MG tablet - 1 tablet BID  losartan-hydrochlorothiazide (HYZAAR) 50-12.5 MG tablet - 1 tablet daily  potassium citrate (UROCIT-K) 10 MEQ (1080 MG) SR tablet - 2 tablets twice daily (taking for kidney stones)  Plan: Continue current medications  Hyperlipidemia   CBC Latest Ref Rng & Units 04/05/2019 12/15/2018 12/13/2018  WBC 4.0 - 10.5 K/uL 13.1(H) 10.1 10.5  Hemoglobin 13.0 - 17.0 g/dL 15.9 11.2(L) 11.1(L)  Hematocrit 39.0 - 52.0 % 50.0 33.9(L) 34.0(L)  Platelets 150 - 400 K/uL 382 282 257   Lipid Panel     Component Value Date/Time   CHOL 107 02/19/2017 1133   TRIG 143.0 02/19/2017 1133   HDL 26.40 (L) 02/19/2017 1133   CHOLHDL 4 02/19/2017 1133   VLDL 28.6 02/19/2017 1133   LDLCALC 52 02/19/2017 1133   LDLDIRECT 72.0 02/13/2015 1122    The ASCVD Risk score Mikey Bussing DC Jr., et al., 2013) failed to calculate  for the following reasons:   The valid total cholesterol range is 130 to 320 mg/dL   LDL goal < 70 (CAD) Patient has failed these meds in past: muscle pain - atorvastatin, simvastatin (shoulders and leg pain), zetia  Patient is currently controlled on the following medications:   aspirin 325 MG tablet  We discussed: PCP recommended reduction to aspirin 81 mg once daily at most recent visit; pt plans to reduce after finishing current supply  Plan: Reduce aspirin to 81 mg daily per PCP recommendation.  Vaccines   Reviewed and discussed patient's vaccination history.    Immunization History  Administered Date(s) Administered  . Fluad Quad(high Dose 65+) 12/13/2018  . Influenza Split 12/17/2010  . Influenza Whole 11/07/2004, 11/07/2008  . Influenza, High Dose Seasonal PF 12/12/2017  . Influenza,inj,Quad PF,6+ Mos 10/05/2012, 10/06/2013, 12/29/2014, 12/04/2015, 11/05/2016  . PFIZER SARS-COV-2 Vaccination 02/21/2019, 03/16/2019  . Pneumococcal Conjugate-13 02/07/2014  . Pneumococcal Polysaccharide-23 11/12/2005,  02/14/2016  . Td 01/07/1997  . Tdap 12/25/2010  . Zoster 12/25/2010   Plan: Recommended patient receive shingles vaccine.   Medication Management:  Misc: clindamycin 300 mg - finished course, mupirocin ointment 2% - prn  OTCs: vitamin C 1000 mg, multivitamin, probiotic, zinc gluconate 50 mg, tumeric - 1 daily, vitamin D3 5000 IU - once daily  Pharmacy/Benefits: Medicare with supplement ([employer]), [employer] Medicare Part D (Mail order)/[facility]   Adherence: no concerns - does not use pillbox, but lines everything up, takes out of basket once taken  Affordability: denies concerns  CCM Follow Up: 11/23/19 at 2:00 PM (telephone)  [name], PharmD Clinical Pharmacist [facility] [phone]

## 2019-04-14 ENCOUNTER — Other Ambulatory Visit (INDEPENDENT_AMBULATORY_CARE_PROVIDER_SITE_OTHER): Payer: Medicare Other

## 2019-04-14 DIAGNOSIS — Z1211 Encounter for screening for malignant neoplasm of colon: Secondary | ICD-10-CM | POA: Diagnosis not present

## 2019-04-14 DIAGNOSIS — C44612 Basal cell carcinoma of skin of right upper limb, including shoulder: Secondary | ICD-10-CM | POA: Diagnosis not present

## 2019-04-14 LAB — FECAL OCCULT BLOOD, IMMUNOCHEMICAL: Fecal Occult Bld: NEGATIVE

## 2019-04-27 ENCOUNTER — Inpatient Hospital Stay (HOSPITAL_COMMUNITY)
Admission: EM | Admit: 2019-04-27 | Discharge: 2019-04-29 | DRG: 378 | Disposition: A | Discharge status: Home / Self Care | Payer: Medicare Other | Attending: Internal Medicine | Admitting: Internal Medicine

## 2019-04-27 ENCOUNTER — Telehealth: Payer: Self-pay | Admitting: *Deleted

## 2019-04-27 ENCOUNTER — Encounter (HOSPITAL_COMMUNITY): Payer: Self-pay

## 2019-04-27 ENCOUNTER — Emergency Department (HOSPITAL_COMMUNITY): Payer: Medicare Other

## 2019-04-27 ENCOUNTER — Other Ambulatory Visit: Payer: Self-pay

## 2019-04-27 DIAGNOSIS — K5731 Diverticulosis of large intestine without perforation or abscess with bleeding: Secondary | ICD-10-CM | POA: Diagnosis not present

## 2019-04-27 DIAGNOSIS — Z20822 Contact with and (suspected) exposure to covid-19: Secondary | ICD-10-CM | POA: Diagnosis not present

## 2019-04-27 DIAGNOSIS — E669 Obesity, unspecified: Secondary | ICD-10-CM | POA: Diagnosis present

## 2019-04-27 DIAGNOSIS — Z7982 Long term (current) use of aspirin: Secondary | ICD-10-CM

## 2019-04-27 DIAGNOSIS — E1165 Type 2 diabetes mellitus with hyperglycemia: Secondary | ICD-10-CM | POA: Diagnosis present

## 2019-04-27 DIAGNOSIS — R61 Generalized hyperhidrosis: Secondary | ICD-10-CM | POA: Diagnosis present

## 2019-04-27 DIAGNOSIS — K648 Other hemorrhoids: Secondary | ICD-10-CM | POA: Diagnosis present

## 2019-04-27 DIAGNOSIS — Z89611 Acquired absence of right leg above knee: Secondary | ICD-10-CM

## 2019-04-27 DIAGNOSIS — Z923 Personal history of irradiation: Secondary | ICD-10-CM

## 2019-04-27 DIAGNOSIS — D62 Acute posthemorrhagic anemia: Secondary | ICD-10-CM | POA: Diagnosis not present

## 2019-04-27 DIAGNOSIS — K5520 Angiodysplasia of colon without hemorrhage: Secondary | ICD-10-CM | POA: Diagnosis not present

## 2019-04-27 DIAGNOSIS — K449 Diaphragmatic hernia without obstruction or gangrene: Secondary | ICD-10-CM | POA: Diagnosis present

## 2019-04-27 DIAGNOSIS — Z8249 Family history of ischemic heart disease and other diseases of the circulatory system: Secondary | ICD-10-CM

## 2019-04-27 DIAGNOSIS — D3502 Benign neoplasm of left adrenal gland: Secondary | ICD-10-CM | POA: Diagnosis not present

## 2019-04-27 DIAGNOSIS — E1122 Type 2 diabetes mellitus with diabetic chronic kidney disease: Secondary | ICD-10-CM | POA: Diagnosis present

## 2019-04-27 DIAGNOSIS — E1151 Type 2 diabetes mellitus with diabetic peripheral angiopathy without gangrene: Secondary | ICD-10-CM | POA: Diagnosis present

## 2019-04-27 DIAGNOSIS — I959 Hypotension, unspecified: Secondary | ICD-10-CM | POA: Diagnosis not present

## 2019-04-27 DIAGNOSIS — R911 Solitary pulmonary nodule: Secondary | ICD-10-CM | POA: Diagnosis present

## 2019-04-27 DIAGNOSIS — K922 Gastrointestinal hemorrhage, unspecified: Secondary | ICD-10-CM | POA: Diagnosis not present

## 2019-04-27 DIAGNOSIS — N1831 Chronic kidney disease, stage 3a: Secondary | ICD-10-CM | POA: Diagnosis present

## 2019-04-27 DIAGNOSIS — Z820 Family history of epilepsy and other diseases of the nervous system: Secondary | ICD-10-CM

## 2019-04-27 DIAGNOSIS — N183 Chronic kidney disease, stage 3 unspecified: Secondary | ICD-10-CM | POA: Diagnosis present

## 2019-04-27 DIAGNOSIS — Z8546 Personal history of malignant neoplasm of prostate: Secondary | ICD-10-CM

## 2019-04-27 DIAGNOSIS — K625 Hemorrhage of anus and rectum: Secondary | ICD-10-CM | POA: Diagnosis not present

## 2019-04-27 DIAGNOSIS — E1142 Type 2 diabetes mellitus with diabetic polyneuropathy: Secondary | ICD-10-CM

## 2019-04-27 DIAGNOSIS — Z794 Long term (current) use of insulin: Secondary | ICD-10-CM

## 2019-04-27 DIAGNOSIS — Z6833 Body mass index (BMI) 33.0-33.9, adult: Secondary | ICD-10-CM

## 2019-04-27 DIAGNOSIS — Z85828 Personal history of other malignant neoplasm of skin: Secondary | ICD-10-CM

## 2019-04-27 DIAGNOSIS — I739 Peripheral vascular disease, unspecified: Secondary | ICD-10-CM | POA: Diagnosis present

## 2019-04-27 DIAGNOSIS — N529 Male erectile dysfunction, unspecified: Secondary | ICD-10-CM | POA: Diagnosis present

## 2019-04-27 DIAGNOSIS — Z951 Presence of aortocoronary bypass graft: Secondary | ICD-10-CM

## 2019-04-27 DIAGNOSIS — R000 Tachycardia, unspecified: Secondary | ICD-10-CM | POA: Diagnosis present

## 2019-04-27 DIAGNOSIS — N179 Acute kidney failure, unspecified: Secondary | ICD-10-CM | POA: Diagnosis not present

## 2019-04-27 DIAGNOSIS — M1990 Unspecified osteoarthritis, unspecified site: Secondary | ICD-10-CM | POA: Diagnosis present

## 2019-04-27 DIAGNOSIS — Z8719 Personal history of other diseases of the digestive system: Secondary | ICD-10-CM

## 2019-04-27 DIAGNOSIS — Z89612 Acquired absence of left leg above knee: Secondary | ICD-10-CM

## 2019-04-27 DIAGNOSIS — G4733 Obstructive sleep apnea (adult) (pediatric): Secondary | ICD-10-CM | POA: Diagnosis present

## 2019-04-27 DIAGNOSIS — I129 Hypertensive chronic kidney disease with stage 1 through stage 4 chronic kidney disease, or unspecified chronic kidney disease: Secondary | ICD-10-CM | POA: Diagnosis present

## 2019-04-27 DIAGNOSIS — I10 Essential (primary) hypertension: Secondary | ICD-10-CM | POA: Diagnosis present

## 2019-04-27 DIAGNOSIS — Z79899 Other long term (current) drug therapy: Secondary | ICD-10-CM

## 2019-04-27 DIAGNOSIS — E875 Hyperkalemia: Secondary | ICD-10-CM | POA: Diagnosis not present

## 2019-04-27 DIAGNOSIS — I2510 Atherosclerotic heart disease of native coronary artery without angina pectoris: Secondary | ICD-10-CM | POA: Diagnosis present

## 2019-04-27 DIAGNOSIS — E66811 Obesity, class 1: Secondary | ICD-10-CM | POA: Diagnosis present

## 2019-04-27 DIAGNOSIS — E785 Hyperlipidemia, unspecified: Secondary | ICD-10-CM | POA: Diagnosis present

## 2019-04-27 DIAGNOSIS — Z87891 Personal history of nicotine dependence: Secondary | ICD-10-CM

## 2019-04-27 DIAGNOSIS — Z888 Allergy status to other drugs, medicaments and biological substances status: Secondary | ICD-10-CM

## 2019-04-27 DIAGNOSIS — Z801 Family history of malignant neoplasm of trachea, bronchus and lung: Secondary | ICD-10-CM

## 2019-04-27 LAB — BASIC METABOLIC PANEL
Anion gap: 12 (ref 5–15)
BUN: 42 mg/dL — ABNORMAL HIGH (ref 8–23)
CO2: 25 mmol/L (ref 22–32)
Calcium: 9.5 mg/dL (ref 8.9–10.3)
Chloride: 104 mmol/L (ref 98–111)
Creatinine, Ser: 1.11 mg/dL (ref 0.61–1.24)
GFR calc Af Amer: >60 mL/min (ref >60)
GFR calc non Af Amer: >60 mL/min (ref >60)
Glucose, Bld: 307 mg/dL — ABNORMAL HIGH (ref 70–99)
Potassium: 7.1 mmol/L (ref 3.5–5.1)
Sodium: 141 mmol/L (ref 135–145)

## 2019-04-27 LAB — POTASSIUM
Potassium: 7.3 mmol/L (ref 3.5–5.1)
Potassium: 7.5 mmol/L (ref 3.5–5.1)

## 2019-04-27 LAB — CBC
HCT: 55.2 % — ABNORMAL HIGH (ref 39.0–52.0)
HCT: 41.9 % (ref 39.0–52.0)
Hemoglobin: 17.4 g/dL — ABNORMAL HIGH (ref 13.0–17.0)
Hemoglobin: 12.8 g/dL — ABNORMAL LOW (ref 13.0–17.0)
MCH: 25 pg — ABNORMAL LOW (ref 26.0–34.0)
MCH: 24.7 pg — ABNORMAL LOW (ref 26.0–34.0)
MCHC: 31.5 g/dL (ref 30.0–36.0)
MCHC: 30.5 g/dL (ref 30.0–36.0)
MCV: 79.2 fL — ABNORMAL LOW (ref 80.0–100.0)
MCV: 80.9 fL (ref 80.0–100.0)
Platelets: 528 10*3/uL — ABNORMAL HIGH (ref 150–400)
Platelets: 598 10*3/uL — ABNORMAL HIGH (ref 150–400)
RBC: 6.97 MIL/uL — ABNORMAL HIGH (ref 4.22–5.81)
RBC: 5.18 MIL/uL (ref 4.22–5.81)
RDW: 20.4 % — ABNORMAL HIGH (ref 11.5–15.5)
RDW: 19.3 % — ABNORMAL HIGH (ref 11.5–15.5)
WBC: 18.5 10*3/uL — ABNORMAL HIGH (ref 4.0–10.5)
WBC: 27.4 10*3/uL — ABNORMAL HIGH (ref 4.0–10.5)
nRBC: 0.1 % (ref 0.0–0.2)
nRBC: 0.3 % — ABNORMAL HIGH (ref 0.0–0.2)

## 2019-04-27 LAB — COMPREHENSIVE METABOLIC PANEL
ALT: 22 U/L (ref 0–44)
AST: 21 U/L (ref 15–41)
Albumin: 4 g/dL (ref 3.5–5.0)
Alkaline Phosphatase: 46 U/L (ref 38–126)
Anion gap: 13 (ref 5–15)
BUN: 37 mg/dL — ABNORMAL HIGH (ref 8–23)
CO2: 23 mmol/L (ref 22–32)
Calcium: 9.9 mg/dL (ref 8.9–10.3)
Chloride: 105 mmol/L (ref 98–111)
Creatinine, Ser: 1.1 mg/dL (ref 0.61–1.24)
GFR calc Af Amer: >60 mL/min (ref >60)
GFR calc non Af Amer: >60 mL/min (ref >60)
Glucose, Bld: 271 mg/dL — ABNORMAL HIGH (ref 70–99)
Potassium: 6.6 mmol/L (ref 3.5–5.1)
Sodium: 141 mmol/L (ref 135–145)
Total Bilirubin: 0.9 mg/dL (ref 0.3–1.2)
Total Protein: 7.2 g/dL (ref 6.5–8.1)

## 2019-04-27 LAB — CBG MONITORING, ED
Glucose-Capillary: 317 mg/dL — ABNORMAL HIGH (ref 70–99)
Glucose-Capillary: 412 mg/dL — ABNORMAL HIGH (ref 70–99)

## 2019-04-27 LAB — POC OCCULT BLOOD, ED: Fecal Occult Bld: POSITIVE — AB

## 2019-04-27 MED ORDER — ZOLPIDEM TARTRATE 5 MG PO TABS: 5.0000 mg | ORAL_TABLET | Freq: Every evening | ORAL | Status: DC | PRN

## 2019-04-27 MED ORDER — SODIUM CHLORIDE 0.9 % IV BOLUS
1000.0000 mL | Freq: Once | INTRAVENOUS | Status: AC
  Administered 2019-04-27: 1000 mL via INTRAVENOUS

## 2019-04-27 MED ORDER — FUROSEMIDE 10 MG/ML IJ SOLN
40.0000 mg | Freq: Once | INTRAMUSCULAR | Status: AC
  Administered 2019-04-27: 22:00:00 40 mg via INTRAVENOUS
  Filled 2019-04-27: qty 4

## 2019-04-27 MED ORDER — ACETAMINOPHEN 325 MG PO TABS: 650.0000 mg | ORAL_TABLET | Freq: Four times a day (QID) | ORAL | Status: DC | PRN

## 2019-04-27 MED ORDER — HYDROCODONE-ACETAMINOPHEN 5-325 MG PO TABS: 1.0000 | ORAL_TABLET | ORAL | Status: DC | PRN

## 2019-04-27 MED ORDER — SODIUM CHLORIDE 0.9 % IV SOLN
1.0000 g | Freq: Once | INTRAVENOUS | Status: AC
  Administered 2019-04-27: 1 g via INTRAVENOUS
  Filled 2019-04-27: qty 10

## 2019-04-27 MED ORDER — SODIUM CHLORIDE 0.9% FLUSH
3.0000 mL | Freq: Two times a day (BID) | INTRAVENOUS | Status: DC
  Administered 2019-04-28 – 2019-04-29 (×3): 3 mL via INTRAVENOUS

## 2019-04-27 MED ORDER — ONDANSETRON HCL 4 MG PO TABS: 4.0000 mg | ORAL_TABLET | Freq: Four times a day (QID) | ORAL | Status: DC | PRN

## 2019-04-27 MED ORDER — SODIUM BICARBONATE 8.4 % IV SOLN
50.0000 meq | Freq: Once | INTRAVENOUS | Status: AC
  Administered 2019-04-27: 50 meq via INTRAVENOUS
  Filled 2019-04-27: qty 50

## 2019-04-27 MED ORDER — INSULIN ASPART 100 UNIT/ML ~~LOC~~ SOLN
8.0000 [IU] | Freq: Once | SUBCUTANEOUS | Status: AC
  Administered 2019-04-27: 8 [IU] via SUBCUTANEOUS

## 2019-04-27 MED ORDER — INSULIN ASPART 100 UNIT/ML IV SOLN
10.0000 [IU] | Freq: Once | INTRAVENOUS | Status: AC
  Administered 2019-04-27: 10 [IU] via INTRAVENOUS

## 2019-04-27 MED ORDER — INSULIN GLARGINE 100 UNIT/ML ~~LOC~~ SOLN
56.0000 [IU] | Freq: Every day | SUBCUTANEOUS | Status: DC
  Administered 2019-04-28: 56 [IU] via SUBCUTANEOUS
  Filled 2019-04-27 (×3): qty 0.56

## 2019-04-27 MED ORDER — ONDANSETRON HCL 4 MG/2ML IJ SOLN: 4.0000 mg | Freq: Four times a day (QID) | INTRAMUSCULAR | Status: DC | PRN

## 2019-04-27 MED ORDER — INSULIN ASPART 100 UNIT/ML ~~LOC~~ SOLN: 0.0000 [IU] | Freq: Every day | SUBCUTANEOUS | Status: DC

## 2019-04-27 MED ORDER — PANTOPRAZOLE SODIUM 40 MG IV SOLR
40.0000 mg | Freq: Two times a day (BID) | INTRAVENOUS | Status: DC
  Administered 2019-04-27 – 2019-04-28 (×2): 40 mg via INTRAVENOUS
  Filled 2019-04-27 (×2): qty 40

## 2019-04-27 MED ORDER — MORPHINE SULFATE (PF) 2 MG/ML IV SOLN: 2.0000 mg | INTRAVENOUS | Status: DC | PRN

## 2019-04-27 MED ORDER — SODIUM CHLORIDE 0.9 % IV BOLUS
500.0000 mL | Freq: Once | INTRAVENOUS | Status: AC
  Administered 2019-04-27: 500 mL via INTRAVENOUS

## 2019-04-27 MED ORDER — LACTATED RINGERS IV SOLN: INTRAVENOUS | Status: DC

## 2019-04-27 MED ORDER — SODIUM ZIRCONIUM CYCLOSILICATE 10 G PO PACK
10.0000 g | PACK | Freq: Four times a day (QID) | ORAL | Status: DC
  Administered 2019-04-27 – 2019-04-28 (×3): 10 g via ORAL
  Filled 2019-04-27 (×7): qty 1

## 2019-04-27 MED ORDER — SODIUM ZIRCONIUM CYCLOSILICATE 10 G PO PACK
10.0000 g | PACK | ORAL | Status: DC
  Filled 2019-04-27 (×2): qty 1

## 2019-04-27 MED ORDER — CALCIUM GLUCONATE-NACL 1-0.675 GM/50ML-% IV SOLN
1.0000 g | Freq: Once | INTRAVENOUS | Status: AC
  Administered 2019-04-27: 1000 mg via INTRAVENOUS
  Filled 2019-04-27: qty 50

## 2019-04-27 MED ORDER — ONDANSETRON HCL 4 MG/2ML IJ SOLN
4.0000 mg | Freq: Once | INTRAMUSCULAR | Status: AC
  Administered 2019-04-27: 4 mg via INTRAVENOUS
  Filled 2019-04-27: qty 2

## 2019-04-27 MED ORDER — PANTOPRAZOLE SODIUM 40 MG IV SOLR: 40.0000 mg | Freq: Two times a day (BID) | INTRAVENOUS | Status: DC

## 2019-04-27 MED ORDER — ACETAMINOPHEN 650 MG RE SUPP: 650.0000 mg | Freq: Four times a day (QID) | RECTAL | Status: DC | PRN

## 2019-04-27 MED ORDER — IOHEXOL 300 MG/ML  SOLN
100.0000 mL | Freq: Once | INTRAMUSCULAR | Status: AC | PRN
  Administered 2019-04-27: 14:00:00 100 mL via INTRAVENOUS

## 2019-04-27 MED ORDER — CARVEDILOL 25 MG PO TABS
25.0000 mg | ORAL_TABLET | Freq: Two times a day (BID) | ORAL | Status: DC
  Administered 2019-04-28: 25 mg via ORAL
  Filled 2019-04-27: qty 1

## 2019-04-27 MED ORDER — HYDRALAZINE HCL 20 MG/ML IJ SOLN: 5.0000 mg | INTRAMUSCULAR | Status: DC | PRN

## 2019-04-27 MED ORDER — SODIUM CHLORIDE 0.9 % IV BOLUS: 500.0000 mL | Freq: Once | INTRAVENOUS | Status: DC

## 2019-04-27 MED ORDER — INSULIN ASPART 100 UNIT/ML ~~LOC~~ SOLN
0.0000 [IU] | Freq: Three times a day (TID) | SUBCUTANEOUS | Status: DC
  Administered 2019-04-28 (×2): 5 [IU] via SUBCUTANEOUS
  Administered 2019-04-28: 3 [IU] via SUBCUTANEOUS

## 2019-04-27 MED ORDER — ALBUTEROL SULFATE (2.5 MG/3ML) 0.083% IN NEBU
10.0000 mg | INHALATION_SOLUTION | Freq: Once | RESPIRATORY_TRACT | Status: AC
  Administered 2019-04-27: 10 mg via RESPIRATORY_TRACT
  Filled 2019-04-27: qty 12

## 2019-04-27 MED ORDER — SODIUM CHLORIDE 0.9 % IV BOLUS
500.0000 mL | Freq: Once | INTRAVENOUS | Status: AC
  Administered 2019-04-27: 16:00:00 500 mL via INTRAVENOUS

## 2019-04-27 NOTE — Consult Note (Addendum)
[facility] Consult: 3:30 PM 04/27/2019  LOS: 0 days    Referring Provider: Dr Eulis Foster in ED  Primary Care Physician:  [name], MD Primary Gastroenterologist:  Dr. Fuller Plan, remotely     Reason for Consultation:  GIB   HPI: [name] is a 69 y.o. male.  PMH OSA.  Kidney stones.  CAD, CABG 2007.  Prostate cancer, treated with radiation seed implantation.  Bil AKAs.    2009 colonoscopy: Adenomatous colon polyp, Diverticulosis, internal hemorrhoids.  11/2009 flexible sigmoidoscopy for hematochezia.  This showed radiation proctitis, small hemorrhoids and mild rectal to descending colon diverticulosis Pt did not respond to colonoscopy recall letter sent 2014.    At home this morning had 2 episodes of large-volume dark, bloody, clotted stool.  He came to the ER where he has had 3 more, the last was at about 2 PM, 2 hours ago.  Endorses pain in the mid abdomen and left lower quadrant that gets much worse as he is passing the blood but subsides after evacuation.  When the pain is severe, he feels sick to his stomach and at one point in the ED today had dry heaves.  At baseline patient has problem with frequent diaphoresis but diaphoresis today is worse than usual.  Patient has never had similar episodes of bleeding and no recent minor bleeding per rectum.  His appetite has been good, normally passes to brown formed stools daily.  No heartburn, no dysphagia.  Daily meds include ASA 325/day.  Not on gastric acid controlling medications  At ED arrival 1030 this morning blood pressure 176/77 currently 104/65. Tachycardic at 117 and currently in upper 120s.   Hgb 17.4, MCV 79.  Hgb 11.2 in 12/2018, 15.9 on 04/05/2019.  On CBC all his cell lines are increased with WBCs 18.5, platelets 528. FOBT +.   BUN 42, creatinine  normal 1.1. Potassium 7.1.  Glucose 307. CTAP w contrast: Scattered descending colon tics.  Focal splenic infarct.  Left adrenal myolipoma.  2 m daily medications include ASA 325 m left pulmonary nodule, favor benign.  Brachytherapy seeds in prostate.  Multiple gallstones without complication, ducts normal.  Patient denies use of NSAIDs.  Other than this GI bleeding today, has not had any unusual or excessive bleeding or bruising.  No chest pain, no dyspnea.    Past Medical History:  Diagnosis Date  . Cellulitis and abscess of left lower extremity 12/20/2017  . Coronary atherosclerosis of unspecified type of vessel, native or graft   . Diabetes mellitus without complication ([place])    type 2  . Hemorrhage of rectum and anus   . History of kidney stones    passed stones  . Impotence of organic origin   . Internal hemorrhoids without mention of complication   . Left below-knee amputee ([place]) 02/23/2016  . Malignant neoplasm of prostate ([place])   . Mononeuritis of unspecified site   . Osteoarthrosis, unspecified whether generalized or localized, unspecified site   . Other and unspecified hyperlipidemia   . Peripheral vascular disease ([place])   .  Personal history of colonic polyps   . Personal history of diabetic foot ulcer    saw wound center, resolved 05/08/2010  . Personal history of gallstones   . Routine general medical examination at a health care facility   . Subacute osteomyelitis, right ankle and foot ([place])   . Type II or unspecified type diabetes mellitus with neurological manifestations, not stated as uncontrolled(250.60)   . Unspecified glaucoma(365.9)   . Unspecified sleep apnea    cpcp  . Unspecified venous (peripheral) insufficiency     Past Surgical History:  Procedure Laterality Date  . AMPUTATION Left 12/30/2012   Procedure: AMPUTATION RAY ;  Surgeon: Meredith Pel, MD;  Location: [facility];  Service: Orthopedics;  Laterality: Left;  LEFT GREAT TOE RAY AMPUTATION  .  AMPUTATION Left 02/23/2016   Procedure: Left Below Knee Amputation;  Surgeon: [name], MD;  Location: [place];  Service: Orthopedics;  Laterality: Left;  . AMPUTATION Left 12/24/2017   Procedure: LEFT ABOVE KNEE AMPUTATION;  Surgeon: [name], MD;  Location: [place];  Service: Orthopedics;  Laterality: Left;  . AMPUTATION Right 12/24/2017   Procedure: RIGHT 5TH RAY AMPUTATION;  Surgeon: [name], MD;  Location: [place];  Service: Orthopedics;  Laterality: Right;  . AMPUTATION Right 12/11/2018   Procedure: RIGHT ABOVE KNEE AMPUTATION;  Surgeon: [name], MD;  Location: [place];  Service: Orthopedics;  Laterality: Right;  . APPENDECTOMY    . BASAL CELL CARCINOMA EXCISION  2/16   left forearm  . BELPHAROPTOSIS REPAIR    . CARDIAC CATHETERIZATION  1998   Negative  . CATARACT EXTRACTION Right 2017   then lid surgery  . COLONOSCOPY    . CORONARY ARTERY BYPASS GRAFT  09/2005   5 bypasses per patient, Post op AFIB  . EYE SURGERY    . FOOT BONE EXCISION Left 11/03/2013   DR DUDA   . I & D EXTREMITY Left 11/03/2013   Procedure: Left Foot Partial Bone Excision Cuboid and Medial Cuneiform, Wound Closures;  Surgeon: [name], MD;  Location: [place];  Service: Orthopedics;  Laterality: Left;  . INSERTION PROSTATE RADIATION SEED  2009   RT and seeds for prostate cancer  . KIDNEY STONE SURGERY  04/1993  . RCA stents  04/2003   EF 55%  . RETINAL DETACHMENT SURGERY  2002-2003  . thrombosed vein  1993   Right leg    Prior to Admission medications   Medication Sig Start Date End Date Taking? Authorizing Provider  Ascorbic Acid (VITAMIN C) 1000 MG tablet Take 1,000 mg by mouth daily.   Yes [provider]  aspirin 325 MG tablet Take 325 mg by mouth daily.     Yes [provider]  carvedilol (COREG) 25 MG tablet TAKE 1 TABLET TWICE DAILY Patient taking differently: Take 25 mg by mouth 2 (two) times daily with a meal.  12/24/18  Yes [name], MD    Cholecalciferol (VITAMIN D3) 125 MCG (5000 UT) TABS Take 5,000 Units by mouth daily.   Yes [provider]  insulin aspart (NOVOLOG FLEXPEN) 100 UNIT/ML FlexPen INJECT 16-26 UNITS SUBCUTANEOUSLY THREE TIMES DAILY WITH MEALS Patient taking differently: Inject 25-30 Units into the skin 3 (three) times daily with meals. Sliding Scale Insulin 08/21/18  Yes Philemon Kingdom, MD  Insulin Glargine, 2 Unit Dial, (TOUJEO MAX SOLOSTAR) 300 UNIT/ML SOPN Inject 55 Units into the skin at bedtime. Patient taking differently: Inject 56 Units into the skin  at bedtime.  07/30/18  Yes Philemon Kingdom, MD  metFORMIN (GLUCOPHAGE) 1000 MG tablet TAKE 1 TABLET EVERY DAY Patient taking differently: Take 2,000 mg by mouth daily.  03/22/19  Yes Philemon Kingdom, MD  Multiple Vitamin (MULTIVITAMIN WITH MINERALS) TABS tablet Take 1 tablet by mouth daily. Centrum Silver for Men 50+   Yes [provider]  mupirocin ointment (BACTROBAN) 2 % APPLY 1 APPLICATION TOPICALLY DAILY. Patient taking differently: Apply 1 application topically daily.  03/29/19  Yes [name], MD  potassium citrate (UROCIT-K) 10 MEQ (1080 MG) SR tablet Take 20 mEq by mouth 2 (two) times daily.   Yes [provider]  Probiotic Product (PROBIOTIC PO) Take 1 capsule by mouth daily. Probulin Probiotic Supplement   Yes [provider]  Turmeric (QC TUMERIC COMPLEX PO) Take by mouth daily.   Yes [provider]  zinc gluconate 50 MG tablet Take 50 mg by mouth 2 (two) times daily.    Yes [provider]  ACCU-CHEK GUIDE test strip USE AS INSTRUCTED TO CHECK SUGAR 3 TIMES DAILY DX- E11.42 07/09/18   Philemon Kingdom, MD  DROPLET PEN NEEDLES 31G X 5 MM MISC USE TO INJECT INSULIN FOUR TIMES DAILY AS INSTRUCTED (FOR DIABETES) 03/05/19   Philemon Kingdom, MD  losartan-hydrochlorothiazide [facility]) 50-12.5 MG tablet TAKE 1 TABLET EVERY DAY 01/16/19   Viviana Simpler I, MD    Scheduled Meds:  Infusions: .  calcium gluconate 1,000 mg (04/27/19 1522)   PRN Meds:    Allergies as of 04/27/2019 - Review Complete 04/27/2019  Allergen Reaction Noted  . Atorvastatin Other (See Comments)   . Ezetimibe Other (See Comments)   . Simvastatin Other (See Comments)     Family History  Problem Relation Age of Onset  . Lung cancer Father   . Multiple sclerosis Mother   . Heart attack Other        paternal aunts and uncles  . Peripheral vascular disease Maternal Grandfather        several amputations    Social History   Socioeconomic History  . Marital status: Widowed    Spouse name: Not on file  . Number of children: 3  . Years of education: Not on file  . Highest education level: Not on file  Occupational History  . Occupation: Heavy equipment mechanic--retired  Tobacco Use  . Smoking status: Former Smoker    Packs/day: 2.00    Years: 35.00    Pack years: 70.00    Types: Cigars, Cigarettes    Quit date: 01/07/2001    Years since quitting: 18.3  . Smokeless tobacco: Never Used  Substance and Sexual Activity  . Alcohol use: Not Currently    Comment: rare  . Drug use: No  . Sexual activity: Yes    Partners: Female  Other Topics Concern  . Not on file  Social History Narrative   Has living will   Daughter [name] is [place] health care POA    Would accept resuscitation attempts--but no prolonged artificial ventilation   No tube feeds if cognitively unaware   Social Determinants of Health   Financial Resource Strain:   . Difficulty of Paying Living Expenses:   Food Insecurity:   . Worried About Charity fundraiser in the Last Year:   . Arboriculturist in the Last Year:   Transportation Needs:   . Film/video editor (Medical):   Marland Kitchen Lack of Transportation (Non-Medical):   Physical Activity:   .  Days of Exercise per Week:   . Minutes of Exercise per Session:   Stress:   . Feeling of Stress :   Social Connections:   . Frequency of Communication with Friends and Family:     . Frequency of Social Gatherings with Friends and Family:   . Attends Religious Services:   . Active Member of Clubs or Organizations:   . Attends Archivist Meetings:   Marland Kitchen Marital Status:   Intimate Partner Violence:   . Fear of Current or Ex-Partner:   . Emotionally Abused:   Marland Kitchen Physically Abused:   . Sexually Abused:     REVIEW OF SYSTEMS: Constitutional: Feels weak today but generally this is not an issue. ENT:  No nose bleeds Pulm: No shortness of breath.  No cough. CV:  No palpitations, no LE edema.  No chest pressure. GU:  No hematuria, no frequency GI: See HPI. Heme: See HPI. Transfusions: No prior history of blood transfusion. Neuro:  No headaches, no peripheral tingling or numbness.  No syncope, no seizures.  No dizziness. Derm:  No itching, no rash or sores.  Endocrine:  No sweats or chills.  No polyuria or dysuria Immunization: Reviewed. Travel:  None beyond local counties in last few months.    PHYSICAL EXAM: Vital signs in last 24 hours: Vitals:   04/27/19 1400 04/27/19 1518  BP: 135/75   Pulse: (!) 125   Resp:    Temp:  98 F (36.7 C)  SpO2: 99%    Wt Readings from Last 3 Encounters:  04/27/19 117.9 kg  04/08/19 117.9 kg  04/04/19 117.9 kg    General: Obese, unwell looking both acutely and chronically.  Alert Head: No facial asymmetry or swelling.  No signs of head trauma. Eyes: No scleral icterus.  No conjunctival pallor. Ears: Not hard of hearing Nose: No discharge. Mouth: Dentition fair.  Tongue midline.  Mucosa pink, moist, clear. Neck: Obese.  Do not appreciate JVD.  No thyromegaly or masses. Lungs: Clear bilaterally without labored breathing or cough.  Breath sounds diminished in upper right fields. Heart: Regular, tacky at 127.  No MRG.  S1, S2 present Abdomen: Obese.  Soft.  Minimal tenderness in the mid and lower abdomen.  No guarding or rebound.  Bowel sounds hypoactive but normal quality..   Rectal: Did not perform  DRE Musc/Skeltl: Bilateral AKA with well-healed amputation sites. Extremities: No obvious edema. Neurologic: Slightly tremulous in the upper extremities.  Oriented x3.  No tremor.  Grip strength full bilaterally.  Alert. Skin: No rash, sores, telangiectasia. Nodes: No cervical adenopathy Psych: Flat affect consistent with his not feeling well.  Intake/Output from previous day: No intake/output data recorded. Intake/Output this shift: Total I/O In: 500 [IV Piggyback:500] Out: 1 [Stool:1]  LAB RESULTS: Recent Labs    04/27/19 1130  WBC 18.5*  HGB 17.4*  HCT 55.2*  PLT 528*   BMET Lab Results  Component Value Date   NA 141 04/27/2019   NA 141 04/27/2019   NA 139 04/05/2019   K 7.1 (HH) 04/27/2019   K 6.6 (HH) 04/27/2019   K 4.7 04/05/2019   CL 104 04/27/2019   CL 105 04/27/2019   CL 101 04/05/2019   CO2 25 04/27/2019   CO2 23 04/27/2019   CO2 22 04/05/2019   GLUCOSE 307 (H) 04/27/2019   GLUCOSE 271 (H) 04/27/2019   GLUCOSE 141 (H) 04/05/2019   BUN 42 (H) 04/27/2019   BUN 37 (H) 04/27/2019   BUN 33 (  H) 04/05/2019   CREATININE 1.11 04/27/2019   CREATININE 1.10 04/27/2019   CREATININE 1.07 04/05/2019   CALCIUM 9.5 04/27/2019   CALCIUM 9.9 04/27/2019   CALCIUM 9.2 04/05/2019   LFT Recent Labs    04/27/19 1130  PROT 7.2  ALBUMIN 4.0  AST 21  ALT 22  ALKPHOS 46  BILITOT 0.9   PT/INR Lab Results  Component Value Date   INR 1.26 11/02/2013   INR 1.26 03/31/2013   INR 1.2 10/19/2007   Hepatitis Panel No results for input(s): HEPBSAG, HCVAB, HEPAIGM, HEPBIGM in the last 72 hours. C-Diff No components found for: CDIFF Lipase     Component Value Date/Time   LIPASE 26 12/14/2017 0006    Drugs of Abuse  No results found for: LABOPIA, COCAINSCRNUR, LABBENZ, AMPHETMU, THCU, LABBARB   RADIOLOGY STUDIES: CT Abdomen Pelvis W Contrast  Result Date: 04/27/2019 CLINICAL DATA:  Rectal bleeding EXAM: CT ABDOMEN AND PELVIS WITH CONTRAST TECHNIQUE:  Multidetector CT imaging of the abdomen and pelvis was performed using the standard protocol following bolus administration of intravenous contrast. CONTRAST:  125mL OMNIPAQUE IOHEXOL 300 MG/ML  SOLN COMPARISON:  None. FINDINGS: Lower chest: Tiny 2 mm nodule in the LEFT lower lobe (image 6/6). Hepatobiliary: No focal hepatic lesion. Multiple gallstones within the collapsed gallbladder. No biliary duct dilatation. Pancreas: Pancreas is normal. No ductal dilatation. No pancreatic inflammation. Spleen: Geographic low-attenuation region within the anterior pole of the spleen the suggest splenic infarction. Adrenals/urinary tract: Small round fat containing lesion of the LEFT adrenal gland measuring 17 mm on image 30/4). The ureters and bladder normal. Stomach/Bowel: Stomach, duodenum proximal small bowel normal. Distal small bowel normal. Terminal ileum normal. Appendix not identified. Ascending transverse colon normal. Scattered diverticular the descending colon without acute inflammation. Vascular/Lymphatic: Abdominal aorta is normal caliber with atherosclerotic calcification. There is no retroperitoneal or periportal lymphadenopathy. No pelvic lymphadenopathy. The IA, SMA and celiac trunk are patent Reproductive: Brachytherapy seeds in the prostate gland Other: No free fluid. Musculoskeletal: No aggressive osseous lesion. IMPRESSION: 1. No explanation for rectal bleeding. Scattered diverticulosis without evidence of diverticulitis. 2. Aortic Atherosclerosis (ICD10-I70.0).  Branch vessels are patent. 3. 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 Benign myelolipoma of the LEFT adrenal gland. 5. Very small LEFT lobe pulmonary nodule is favored benign. No follow-up needed if patient is low-risk. Non-contrast chest CT can be considered in 12 months if patient is high-risk. This recommendation follows the consensus statement: Guidelines for Management of Incidental Pulmonary Nodules Detected on CT Images: From  the Fleischner Society 2017; Radiology 2017; 284:228-243. Electronically Signed   By: Suzy Bouchard M.D.   On: 04/27/2019 14:22      IMPRESSION:   *    Lower GI bleed.  Suspect this is diverticular.   Had adenomatous polyp 2009 and no subsequent surveillance colonoscopies. Limited hematochezia, nothing like what is going on today, in 2011.  Radiation proctitis, hemorrhoids and descending colon diverticulosis on flexible sigmoidoscopy then.  *    Elevated WBCs, RBCs, platelets.  Trend started late March 2021, 3 weeks ago.  *   Hyperkalemia.  Other than elevated BUN which has trended upwards for the last few months, renal function is preserved with normal creatinine.  *    Diaphoresis.  Worse now with GI bleed but this has been a problem for several months.  *    IDDM.  Glucose 307. Hgb A1c 8.6 C/W average serum glucose 200 in early 12/2018  *  S/p bilateral AKA's.    PLAN:     *    Although I do not think this is an upper GI bleed, his BUN is elevated which can reflect upper GI source of bleeding.  Therefore I am going to initiate Protonix 40 IV bid empirically for the time being   Azucena Freed  04/27/2019, 3:30 PM Phone [phone]    Attending physician's note   I have taken an interval history, reviewed the chart and examined the patient. I agree with the Advanced Practitioner's note, impression and recommendations.   GI bleed- UGI vs LGI H/O colonic polyps Multiple comorbid conditions including IDDM with B/L AKAs, hypokalemia, sleep apnea on BiPAP  Plan: -IV protonix -Trend CBC -EGD and colon 4/22 -D/W pt and pt's wife.  Carmell Austria, MD Velora Heckler Fabienne Bruns [phone].

## 2019-04-27 NOTE — ED Notes (Signed)
Pt returned from CT °

## 2019-04-27 NOTE — ED Provider Notes (Signed)
[facility] EMERGENCY DEPARTMENT Provider Note   CSN: [place]:[id number] Arrival date & time: 04/27/19  1018     History Chief Complaint  Patient presents with  . Rectal Bleeding    [name] is a 69 y.o. male.  HPI Who presents for evaluation of rectal bleeding, which started this morning.  The material is dark in color.  I saw the patient at 11:13 AM.  He was being transferred from wheelchair to bed, and had dark-colored stool on the transfer sheet.  He states that initially this morning he had a normal bowel movement, then has had 3 episodes of bleeding, without stool, since then.  He reports having nausea today without vomiting.  He denies fever, chills, abdominal pain, weakness or dizziness.  He has history of prostate cancer, and had radiation treatment at that time.  He has had some rectal bleeding associated with the radiation, but none in several years.  Last colonoscopy was about 4 years ago.  He states that he had a ulcer of his colon at 1 time, about 10 years ago.  He is taking his usual medications.  He does not take anticoagulants.  There are no other known modifying factors.  Past Medical History:  Diagnosis Date  . Cellulitis and abscess of left lower extremity 12/20/2017  . Coronary atherosclerosis of unspecified type of vessel, native or graft   . Diabetes mellitus without complication ([place])    type 2  . Hemorrhage of rectum and anus   . History of kidney stones    passed stones  . Impotence of organic origin   . Internal hemorrhoids without mention of complication   . Left below-knee amputee ([place]) 02/23/2016  . Malignant neoplasm of prostate ([place])   . Mononeuritis of unspecified site   . Osteoarthrosis, unspecified whether generalized or localized, unspecified site   . Other and unspecified hyperlipidemia   . Peripheral vascular disease ([place])   . Personal history of colonic polyps   . Personal history of diabetic foot ulcer    saw wound center,  resolved 05/08/2010  . Personal history of gallstones   . Routine general medical examination at a health care facility   . Subacute osteomyelitis, right ankle and foot ([place])   . Type II or unspecified type diabetes mellitus with neurological manifestations, not stated as uncontrolled(250.60)   . Unspecified glaucoma(365.9)   . Unspecified sleep apnea    cpcp  . Unspecified venous (peripheral) insufficiency     Patient Active Problem List   Diagnosis Date Noted  . Above knee amputation of right lower extremity ([place]) 12/14/2018  . Type 2 diabetes mellitus with peripheral neuropathy (H[facility])   . Left above-knee amputee ([place]) 12/26/2017  . History of complete ray amputation of fifth toe of right foot ([place])   . Essential hypertension   . Chronic renal disease, stage III 02/19/2017  . Ulcer of toe of right foot, limited to breakdown of skin ([place]) 10/11/2016  . Type 2 diabetes mellitus with diabetic polyneuropathy, with long-term current use of insulin ([place]) 03/29/2015  . Advance directive discussed with patient 02/07/2014  . Leucocytosis 04/01/2013  . Routine general medical examination at a health care facility 12/25/2010  . PERSONAL HX COLONIC POLYPS 11/22/2009  . Venous (peripheral) insufficiency 06/29/2008  . Osteoarthritis, multiple sites 04/03/2007  . ERECTILE DYSFUNCTION, ORGANIC 06/13/2006  . Unspecified glaucoma 06/11/2006  . Coronary atherosclerosis of native coronary artery 06/11/2006  . Obstructive sleep apnea 06/11/2006    Past Surgical  History:  Procedure Laterality Date  . AMPUTATION Left 12/30/2012   Procedure: AMPUTATION RAY ;  Surgeon: Meredith Pel, MD;  Location: [facility];  Service: Orthopedics;  Laterality: Left;  LEFT GREAT TOE RAY AMPUTATION  . AMPUTATION Left 02/23/2016   Procedure: Left Below Knee Amputation;  Surgeon: [name], MD;  Location: [facility];  Service: Orthopedics;  Laterality: Left;  . AMPUTATION Left 12/24/2017   Procedure: LEFT ABOVE KNEE  AMPUTATION;  Surgeon: [name], MD;  Location: [facility];  Service: Orthopedics;  Laterality: Left;  . AMPUTATION Right 12/24/2017   Procedure: RIGHT 5TH RAY AMPUTATION;  Surgeon: [name], MD;  Location: [facility];  Service: Orthopedics;  Laterality: Right;  . AMPUTATION Right 12/11/2018   Procedure: RIGHT ABOVE KNEE AMPUTATION;  Surgeon: [name], MD;  Location: [facility];  Service: Orthopedics;  Laterality: Right;  . APPENDECTOMY    . BASAL CELL CARCINOMA EXCISION  2/16   left forearm  . BELPHAROPTOSIS REPAIR    . CARDIAC CATHETERIZATION  1998   Negative  . CATARACT EXTRACTION Right 2017   then lid surgery  . COLONOSCOPY    . CORONARY ARTERY BYPASS GRAFT  09/2005   5 bypasses per patient, Post op AFIB  . EYE SURGERY    . FOOT BONE EXCISION Left 11/03/2013   DR DUDA   . I & D EXTREMITY Left 11/03/2013   Procedure: Left Foot Partial Bone Excision Cuboid and Medial Cuneiform, Wound Closures;  Surgeon: [name], MD;  Location: [facility];  Service: Orthopedics;  Laterality: Left;  . INSERTION PROSTATE RADIATION SEED  2009   RT and seeds for prostate cancer  . KIDNEY STONE SURGERY  04/1993  . RCA stents  04/2003   EF 55%  . RETINAL DETACHMENT SURGERY  2002-2003  . thrombosed vein  1993   Right leg       Family History  Problem Relation Age of Onset  . Lung cancer Father   . Multiple sclerosis Mother   . Heart attack Other        paternal aunts and uncles  . Peripheral vascular disease Maternal Grandfather        several amputations    Social History   Tobacco Use  . Smoking status: Former Smoker    Packs/day: 2.00    Years: 35.00    Pack years: 70.00    Types: Cigars, Cigarettes    Quit date: 01/07/2001    Years since quitting: 18.3  . Smokeless tobacco: Never Used  Substance Use Topics  . Alcohol use: Not Currently    Comment: rare  . Drug use: No    Home Medications Prior to Admission medications   Medication Sig Start Date End Date Taking? Authorizing  Provider  Ascorbic Acid (VITAMIN C) 1000 MG tablet Take 1,000 mg by mouth daily.   Yes [provider]  aspirin 325 MG tablet Take 325 mg by mouth daily.     Yes [provider]  carvedilol (COREG) 25 MG tablet TAKE 1 TABLET TWICE DAILY Patient taking differently: Take 25 mg by mouth 2 (two) times daily with a meal.  12/24/18  Yes [name], MD  Cholecalciferol (VITAMIN D3) 125 MCG (5000 UT) TABS Take 5,000 Units by mouth daily.   Yes [provider]  insulin aspart (NOVOLOG FLEXPEN) 100 UNIT/ML FlexPen INJECT 16-26 UNITS SUBCUTANEOUSLY THREE TIMES DAILY WITH MEALS Patient taking differently: Inject 25-30 Units into the skin 3 (three) times  daily with meals. Sliding Scale Insulin 08/21/18  Yes Philemon Kingdom, MD  Insulin Glargine, 2 Unit Dial, (TOUJEO MAX SOLOSTAR) 300 UNIT/ML SOPN Inject 55 Units into the skin at bedtime. Patient taking differently: Inject 56 Units into the skin at bedtime.  07/30/18  Yes Philemon Kingdom, MD  metFORMIN (GLUCOPHAGE) 1000 MG tablet TAKE 1 TABLET EVERY DAY Patient taking differently: Take 2,000 mg by mouth daily.  03/22/19  Yes Philemon Kingdom, MD  Multiple Vitamin (MULTIVITAMIN WITH MINERALS) TABS tablet Take 1 tablet by mouth daily. Centrum Silver for Men 50+   Yes [provider]  mupirocin ointment (BACTROBAN) 2 % APPLY 1 APPLICATION TOPICALLY DAILY. Patient taking differently: Apply 1 application topically daily.  03/29/19  Yes [name], MD  potassium citrate (UROCIT-K) 10 MEQ (1080 MG) SR tablet Take 20 mEq by mouth 2 (two) times daily.   Yes [provider]  Probiotic Product (PROBIOTIC PO) Take 1 capsule by mouth daily. Probulin Probiotic Supplement   Yes [provider]  Turmeric (QC TUMERIC COMPLEX PO) Take by mouth daily.   Yes [provider]  zinc gluconate 50 MG tablet Take 50 mg by mouth 2 (two) times daily.    Yes [provider]  ACCU-CHEK GUIDE test strip USE  AS INSTRUCTED TO CHECK SUGAR 3 TIMES DAILY DX- E11.42 07/09/18   Philemon Kingdom, MD  DROPLET PEN NEEDLES 31G X 5 MM MISC USE TO INJECT INSULIN FOUR TIMES DAILY AS INSTRUCTED (FOR DIABETES) 03/05/19   Philemon Kingdom, MD  losartan-hydrochlorothiazide [facility]) 50-12.5 MG tablet TAKE 1 TABLET EVERY DAY 01/16/19   [name], MD    Allergies    Atorvastatin, Ezetimibe, and Simvastatin  Review of Systems   Review of Systems  All other systems reviewed and are negative.   Physical Exam Updated Vital Signs BP 114/62   Pulse (!) 129   Temp 98 F (36.7 C) (Oral)   Resp 16   Wt 117.9 kg   SpO2 100%   BMI 33.38 kg/m   Physical Exam Vitals and nursing note reviewed.  Constitutional:      General: He is not in acute distress.    Appearance: He is well-developed. He is obese. He is not ill-appearing, toxic-appearing or diaphoretic.  HENT:     Head: Normocephalic and atraumatic.     Right Ear: External ear normal.     Left Ear: External ear normal.  Eyes:     Conjunctiva/sclera: Conjunctivae normal.     Pupils: Pupils are equal, round, and reactive to light.  Neck:     Trachea: Phonation normal.  Cardiovascular:     Rate and Rhythm: Normal rate and regular rhythm.     Heart sounds: Normal heart sounds.  Pulmonary:     Effort: Pulmonary effort is normal.     Breath sounds: Normal breath sounds.  Abdominal:     General: There is no distension.     Palpations: Abdomen is soft.     Tenderness: There is no abdominal tenderness.  Musculoskeletal:        General: Normal range of motion.     Cervical back: Normal range of motion and neck supple.     Comments: Bilateral high, above-knee amputations.  Skin:    General: Skin is warm and dry.  Neurological:     Mental Status: He is alert and oriented to person, place, and time.     Cranial Nerves: No cranial nerve deficit.     Sensory: No sensory  deficit.     Motor: No abnormal muscle tone.     Coordination: Coordination  normal.  Psychiatric:        Mood and Affect: Mood normal.        Behavior: Behavior normal.        Thought Content: Thought content normal.        Judgment: Judgment normal.     ED Results / Procedures / Treatments   Labs (all labs ordered are listed, but only abnormal results are displayed) Labs Reviewed  COMPREHENSIVE METABOLIC PANEL - Abnormal; Notable for the following components:      Result Value   Potassium 6.6 (*)    Glucose, Bld 271 (*)    BUN 37 (*)    All other components within normal limits  CBC - Abnormal; Notable for the following components:   WBC 18.5 (*)    RBC 6.97 (*)    Hemoglobin 17.4 (*)    HCT 55.2 (*)    MCV 79.2 (*)    MCH 25.0 (*)    RDW 20.4 (*)    Platelets 528 (*)    All other components within normal limits  BASIC METABOLIC PANEL - Abnormal; Notable for the following components:   Potassium 7.1 (*)    Glucose, Bld 307 (*)    BUN 42 (*)    All other components within normal limits  POC OCCULT BLOOD, ED - Abnormal; Notable for the following components:   Fecal Occult Bld POSITIVE (*)    All other components within normal limits  POTASSIUM  POTASSIUM  POTASSIUM  POTASSIUM  NA AND K (SODIUM & POTASSIUM), RAND UR  TYPE AND SCREEN    EKG None  Radiology CT Abdomen Pelvis W Contrast  Result Date: 04/27/2019 CLINICAL DATA:  Rectal bleeding EXAM: CT ABDOMEN AND PELVIS WITH CONTRAST TECHNIQUE: Multidetector CT imaging of the abdomen and pelvis was performed using the standard protocol following bolus administration of intravenous contrast. CONTRAST:  129mL OMNIPAQUE IOHEXOL 300 MG/ML  SOLN COMPARISON:  None. FINDINGS: Lower chest: Tiny 2 mm nodule in the LEFT lower lobe (image 6/6). Hepatobiliary: No focal hepatic lesion. Multiple gallstones within the collapsed gallbladder. No biliary duct dilatation. Pancreas: Pancreas is normal. No ductal dilatation. No pancreatic inflammation. Spleen: Geographic low-attenuation region within the anterior  pole of the spleen the suggest splenic infarction. Adrenals/urinary tract: Small round fat containing lesion of the LEFT adrenal gland measuring 17 mm on image 30/4). The ureters and bladder normal. Stomach/Bowel: Stomach, duodenum proximal small bowel normal. Distal small bowel normal. Terminal ileum normal. Appendix not identified. Ascending transverse colon normal. Scattered diverticular the descending colon without acute inflammation. Vascular/Lymphatic: Abdominal aorta is normal caliber with atherosclerotic calcification. There is no retroperitoneal or periportal lymphadenopathy. No pelvic lymphadenopathy. The IA, SMA and celiac trunk are patent Reproductive: Brachytherapy seeds in the prostate gland Other: No free fluid. Musculoskeletal: No aggressive osseous lesion. IMPRESSION: 1. No explanation for rectal bleeding. Scattered diverticulosis without evidence of diverticulitis. 2. Aortic Atherosclerosis (ICD10-I70.0).  Branch vessels are patent. 3. 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 Benign myelolipoma of the LEFT adrenal gland. 5. Very small LEFT lobe pulmonary nodule is favored benign. No follow-up needed if patient is low-risk. Non-contrast chest CT can be considered in 12 months if patient is high-risk. This recommendation follows the consensus statement: Guidelines for Management of Incidental Pulmonary Nodules Detected on CT Images: From the Fleischner Society 2017; Radiology 2017; 284:228-243. Electronically Signed   By: Nicole Kindred  Leonia Reeves M.D.   On: 04/27/2019 14:22    Procedures Procedures (including critical care time)  Medications Ordered in ED Medications  sodium chloride 0.9 % bolus 500 mL (has no administration in time range)  sodium chloride 0.9 % bolus 500 mL (0 mLs Intravenous Stopped 04/27/19 1427)  iohexol (OMNIPAQUE) 300 MG/ML solution 100 mL (100 mLs Intravenous Contrast Given 04/27/19 1333)  insulin aspart (novoLOG) injection 10 Units (10 Units Intravenous Given  04/27/19 1518)  calcium gluconate 1 g/ 50 mL sodium chloride IVPB (0 g Intravenous Stopped 04/27/19 1611)    ED Course  I have reviewed the triage vital signs and the nursing notes.  Pertinent labs & imaging results that were available during my care of the patient were reviewed by me and considered in my medical decision making (see chart for details).  Clinical Course as of Apr 26 1616  Tue Apr 27, 2019  1259 Initial potassium elevated, with normal creatinine.  Will check EKG and repeat potassium level.   [EW]  1300 Normal except potassium high, glucose high, BUN high  Comprehensive metabolic panel(!!) [EW]  123456 Abnormal, blood present  POC occult blood, ED(!) [EW]  1300 Normal except white count high, hemoglobin high, hematocrit high, MCV low, platelets high  CBC(!) [EW]  1459 Normal except potassium high, glucose high, BUN high  Basic metabolic panel(!!) [EW]  [id number] Per radiologist, no source for rectal bleeding.  CT Abdomen Pelvis W Contrast [EW]  [id number] Case discussed with gastroenterology, they will see as a consultant for possible EGD.   [EW]    Clinical Course User Index [EW] Daleen Bo, MD   MDM Rules/Calculators/A&P                       Patient Vitals for the past 24 hrs:  BP Temp Temp src Pulse Resp SpO2 Weight  04/27/19 1610 114/62 -- -- (!) 129 16 100 % --  04/27/19 1545 124/81 -- -- (!) 129 (!) 22 98 % --  04/27/19 1530 104/65 -- -- (!) 133 20 93 % --  04/27/19 1518 -- 98 F (36.7 C) Oral -- -- -- --  04/27/19 1500 (!) 146/75 -- -- (!) 127 (!) 24 97 % --  04/27/19 1400 135/75 -- -- (!) 125 -- 99 % --  04/27/19 1315 (!) 165/71 -- -- (!) 121 18 100 % --  04/27/19 1245 (!) 141/72 -- -- (!) 122 20 100 % --  04/27/19 1230 140/69 -- -- (!) 118 17 95 % --  04/27/19 1215 133/64 -- -- (!) 118 13 100 % --  04/27/19 1200 (!) 151/75 -- -- (!) 116 (!) 21 100 % --  04/27/19 1145 139/73 -- -- (!) 116 18 100 % --  04/27/19 1130 124/74 -- -- (!) 113 20 97 % --   04/27/19 1119 (!) 146/78 97.6 F (36.4 C) Oral (!) 117 19 99 % --  04/27/19 1041 -- -- -- -- -- -- 117.9 kg  04/27/19 1024 (!) 176/77 97.6 F (36.4 C) Oral (!) 117 18 98 % --    3:07 PM Reevaluation with update and discussion. After initial assessment and treatment, an updated evaluation reveals patient is diaphoretic and states he feels hot.  Heart rate currently 130.  Blood pressure 145/75.  Patient has not been febrile here.  Findings discussed with him, he states he takes "4 potassium pills every day," he does not know the strength.  He states he takes them to prevent kidney  stones.  Findings discussed and questions answered. Daleen Bo   Medical Decision Making:  This patient is presenting for evaluation of rectal bleeding, which does require a range of treatment options, and is a complaint that involves a high risk of morbidity and mortality. The differential diagnoses include upper and lower GI bleeding, from various sources including ulcer, infection, cancer, radiation enteritis. I decided  to review old records, and in summary he is status post for cancer, receiving radiation as further treatment.  History of prior GI bleeding, source not exactly clear.  He presents hemodynamically stable, with frequent stooling, dark blood suspected to be upper.. I was able to obtain additional historical information from his significant other, with him. Clinical Laboratory Tests Ordered, included CBC, chemistry panel, blood type.  CBC with elevated white count and hemoglobin.  Hematocrit elevated, MCV slightly low.  Platelets elevated.  Chemistry panel with elevated glucose, and BUN.  Also incidental hypokalemia, without noted hemolysis.  Repeat chemistry panel as potassium elevated 7.1, glucose high at 307, BUN high 42.  Fecal occult test positive for bleeding. Radiologic Tests Ordered, included CT abdomen pelvis, abnormal does not show source of bleeding.  See radiologist interpretation.  Cardiac  Monitor Tracing which shows sinus tachycardia; Specimen off material from rectum, consistent with melena indicating gastrointestinal bleeding.   Critical Interventions-IV fluid support, for tachycardia and rectal bleeding.  Incidental hyperkalemia requiring treatment for lowering potassium acutely, with calcium, and insulin; both given intravenously.  After These Interventions, the Patient was reevaluated and was found with likely upper GI bleeding evidenced by elevated BUN, and normal to elevated hemoglobin.  Incidental hyperkalemia, is likely from taking high-dose potassium supplement taken to prevent kidney stones.  No evidence for cardiac compromise.  CRITICAL CARE- Yes Performed by: Daleen Bo   Nursing Notes Reviewed/ Care Coordinated Applicable Imaging Reviewed Interpretation of Laboratory Data incorporated into ED treatment  4:16 PM-Consult complete with hospitalist. Patient case explained and discussed. She agrees to admit patient for further evaluation and treatment. Call ended at 1627  Plan: Admit      Final Clinical Impression(s) / ED Diagnoses Final diagnoses:  Rectal bleeding  Hyperkalemia  Tachycardia    Rx / DC Orders ED Discharge Orders    None       Daleen Bo, MD 04/28/19 1140

## 2019-04-27 NOTE — ED Notes (Signed)
Dr. Eulis Foster made aware of potassium of 6.6.

## 2019-04-27 NOTE — Progress Notes (Signed)
Potassium has increased further, now 7.5.  EKG with SR, normal QRS.   IV calcium, insulin/dextrose, bicarbonate, albuterol, and NS bolus ordered. Appreciate nephrologist reviewing case, recommends Lokelma now and in a few hours, and also Lasix with the IVF. Continue cardiac monitoring, serial potassium levels.

## 2019-04-27 NOTE — ED Notes (Signed)
Pt had another bowel movement with a moderate amount of dark red blood. Pt then vomited x1 with light brown emesis. MD made aware.

## 2019-04-27 NOTE — Telephone Encounter (Signed)
Had passed blood per rectum. Then in ER waiting room passed a bloody stool and got diaphoretic. May be having sig GI bleed--glad he is already there

## 2019-04-27 NOTE — ED Notes (Signed)
Pt had bowel movement that was mostly liquid containing dark red blood.

## 2019-04-27 NOTE — Telephone Encounter (Signed)
A message was left on voicemail by a male stating that patient was passing blood when he was going to the bathroom. Tried to call the patient back and got his voicemail. Left a message on voicemail to call the office back.  Looks like patient is at the ER now.

## 2019-04-27 NOTE — Telephone Encounter (Signed)
Lady called back and left /vm as FYI only to Dr [name] that pt was at [facility] ED now for bloody BM's with clots. Per this note Dr [name] is aware.

## 2019-04-27 NOTE — H&P (Signed)
History and Physical    [name] [id number] DOB: [birth year] DOA: 04/27/2019  PCP: [name], MD Consultants:  [name] - orthopedics; [name] - endocrinology Patient coming from: Lives with Iantha Fallen; [address], [phone]  Chief Complaint:  GI bleeding  HPI: [name] is a 69 y.o. male with medical history significant of OSA on CPAP; DM; PVD s/p B LE amputations; HLD; CAD; and remote GI bleed presenting with GI bleeding.  He got up this AM and went to the bathroom and the toilet was full of blood - sort of dark, clotted blood.  There was stool in it that was ok.  About 30-45 minutes later he went again and it was all blood, the same color.  It happened one more time at home and 3 times in the ER (maybe 4).  +nausea, emesis x 1 and nonbloody.  +diffuse abdominal pain.  No fever but he has been flushed and sweaty.  Not lightheaded, dizzy, SOB since vomiting.  Yesterday he was fine.   ED Course:  Upper GI bleed - elevated BUN, melena.  Hgb >15.  Incidental hyperkalemia - likely taking too much.  Given insulin, calcium gluconate, IVF.  GI to see.  Review of Systems: As per HPI; otherwise review of systems reviewed and negative.   Ambulatory Status:  B LE amputations  COVID Vaccine Status:   Complete  Past Medical History:  Diagnosis Date  . Cellulitis and abscess of left lower extremity 12/20/2017  . Coronary atherosclerosis of unspecified type of vessel, native or graft   . Hemorrhage of rectum and anus   . History of kidney stones    passed stones  . Impotence of organic origin   . Internal hemorrhoids without mention of complication   . Malignant neoplasm of prostate ([place])   . Mononeuritis of unspecified site   . Osteoarthrosis, unspecified whether generalized or localized, unspecified site   . Other and unspecified hyperlipidemia   . Peripheral vascular disease (HCC)    B BKA  . Personal history of colonic polyps   . Personal history of gallstones   .  Subacute osteomyelitis, right ankle and foot ([place])   . Type II or unspecified type diabetes mellitus with neurological manifestations, not stated as uncontrolled(250.60)   . Unspecified glaucoma(365.9)   . Unspecified sleep apnea    cpcp    Past Surgical History:  Procedure Laterality Date  . AMPUTATION Left 12/30/2012   Procedure: AMPUTATION RAY ;  Surgeon: Meredith Pel, MD;  Location: [facility];  Service: Orthopedics;  Laterality: Left;  LEFT GREAT TOE RAY AMPUTATION  . AMPUTATION Left 02/23/2016   Procedure: Left Below Knee Amputation;  Surgeon: [name], MD;  Location: [place];  Service: Orthopedics;  Laterality: Left;  . AMPUTATION Left 12/24/2017   Procedure: LEFT ABOVE KNEE AMPUTATION;  Surgeon: [name], MD;  Location: [place];  Service: Orthopedics;  Laterality: Left;  . AMPUTATION Right 12/24/2017   Procedure: RIGHT 5TH RAY AMPUTATION;  Surgeon: [name], MD;  Location: [place];  Service: Orthopedics;  Laterality: Right;  . AMPUTATION Right 12/11/2018   Procedure: RIGHT ABOVE KNEE AMPUTATION;  Surgeon: [name], MD;  Location: [place];  Service: Orthopedics;  Laterality: Right;  . APPENDECTOMY    . BASAL CELL CARCINOMA EXCISION  2/16   left forearm  . BELPHAROPTOSIS REPAIR    . CARDIAC CATHETERIZATION  1998   Negative  . CATARACT EXTRACTION Right 2017   then  lid surgery  . COLONOSCOPY    . CORONARY ARTERY BYPASS GRAFT  09/2005   5 bypasses per patient, Post op AFIB  . EYE SURGERY    . FOOT BONE EXCISION Left 11/03/2013   DR DUDA   . I & D EXTREMITY Left 11/03/2013   Procedure: Left Foot Partial Bone Excision Cuboid and Medial Cuneiform, Wound Closures;  Surgeon: [name], MD;  Location: [place];  Service: Orthopedics;  Laterality: Left;  . INSERTION PROSTATE RADIATION SEED  2009   RT and seeds for prostate cancer  . KIDNEY STONE SURGERY  04/1993  . RCA stents  04/2003   EF 55%  . RETINAL DETACHMENT SURGERY  2002-2003  . thrombosed vein  1993   Right  leg    Social History   Socioeconomic History  . Marital status: Widowed    Spouse name: Not on file  . Number of children: 3  . Years of education: Not on file  . Highest education level: Not on file  Occupational History  . Occupation: Heavy equipment mechanic--retired  Tobacco Use  . Smoking status: Former Smoker    Packs/day: 2.00    Years: 35.00    Pack years: 70.00    Types: Cigars, Cigarettes    Quit date: 01/07/2001    Years since quitting: 18.3  . Smokeless tobacco: Never Used  Substance and Sexual Activity  . Alcohol use: Not Currently    Comment: rare  . Drug use: No  . Sexual activity: Yes    Partners: Female  Other Topics Concern  . Not on file  Social History Narrative   Has living will   Daughter [name] is [place] health care POA    Would accept resuscitation attempts--but no prolonged artificial ventilation   No tube feeds if cognitively unaware   Social Determinants of Health   Financial Resource Strain:   . Difficulty of Paying Living Expenses:   Food Insecurity:   . Worried About Charity fundraiser in the Last Year:   . Arboriculturist in the Last Year:   Transportation Needs:   . Film/video editor (Medical):   Marland Kitchen Lack of Transportation (Non-Medical):   Physical Activity:   . Days of Exercise per Week:   . Minutes of Exercise per Session:   Stress:   . Feeling of Stress :   Social Connections:   . Frequency of Communication with Friends and Family:   . Frequency of Social Gatherings with Friends and Family:   . Attends Religious Services:   . Active Member of Clubs or Organizations:   . Attends Archivist Meetings:   Marland Kitchen Marital Status:   Intimate Partner Violence:   . Fear of Current or Ex-Partner:   . Emotionally Abused:   Marland Kitchen Physically Abused:   . Sexually Abused:     Allergies  Allergen Reactions  . Atorvastatin Other (See Comments)    myalgias  . Ezetimibe Other (See Comments)    Body ache  . Simvastatin Other  (See Comments)    Body ache    Family History  Problem Relation Age of Onset  . Lung cancer Father   . Multiple sclerosis Mother   . Heart attack Other        paternal aunts and uncles  . Peripheral vascular disease Maternal Grandfather        several amputations    Prior to Admission medications   Medication Sig Start Date End Date Taking?  Authorizing Provider  Ascorbic Acid (VITAMIN C) 1000 MG tablet Take 1,000 mg by mouth daily.   Yes [provider]  aspirin 325 MG tablet Take 325 mg by mouth daily.     Yes [provider]  carvedilol (COREG) 25 MG tablet TAKE 1 TABLET TWICE DAILY Patient taking differently: Take 25 mg by mouth 2 (two) times daily with a meal.  12/24/18  Yes [name], MD  Cholecalciferol (VITAMIN D3) 125 MCG (5000 UT) TABS Take 5,000 Units by mouth daily.   Yes [provider]  insulin aspart (NOVOLOG FLEXPEN) 100 UNIT/ML FlexPen INJECT 16-26 UNITS SUBCUTANEOUSLY THREE TIMES DAILY WITH MEALS Patient taking differently: Inject 25-30 Units into the skin 3 (three) times daily with meals. Sliding Scale Insulin 08/21/18  Yes Philemon Kingdom, MD  Insulin Glargine, 2 Unit Dial, (TOUJEO MAX SOLOSTAR) 300 UNIT/ML SOPN Inject 55 Units into the skin at bedtime. Patient taking differently: Inject 56 Units into the skin at bedtime.  07/30/18  Yes Philemon Kingdom, MD  metFORMIN (GLUCOPHAGE) 1000 MG tablet TAKE 1 TABLET EVERY DAY Patient taking differently: Take 2,000 mg by mouth daily.  03/22/19  Yes Philemon Kingdom, MD  Multiple Vitamin (MULTIVITAMIN WITH MINERALS) TABS tablet Take 1 tablet by mouth daily. Centrum Silver for Men 50+   Yes [provider]  mupirocin ointment (BACTROBAN) 2 % APPLY 1 APPLICATION TOPICALLY DAILY. Patient taking differently: Apply 1 application topically daily.  03/29/19  Yes [name], MD  potassium citrate (UROCIT-K) 10 MEQ (1080 MG) SR tablet Take 20 mEq by mouth 2 (two) times daily.   Yes  [provider]  Probiotic Product (PROBIOTIC PO) Take 1 capsule by mouth daily. Probulin Probiotic Supplement   Yes [provider]  Turmeric (QC TUMERIC COMPLEX PO) Take by mouth daily.   Yes [provider]  zinc gluconate 50 MG tablet Take 50 mg by mouth 2 (two) times daily.    Yes [provider]  ACCU-CHEK GUIDE test strip USE AS INSTRUCTED TO CHECK SUGAR 3 TIMES DAILY DX- E11.42 07/09/18   Philemon Kingdom, MD  DROPLET PEN NEEDLES 31G X 5 MM MISC USE TO INJECT INSULIN FOUR TIMES DAILY AS INSTRUCTED (FOR DIABETES) 03/05/19   Philemon Kingdom, MD  losartan-hydrochlorothiazide [facility]) 50-12.5 MG tablet TAKE 1 TABLET EVERY DAY 01/16/19   [name], MD    Physical Exam: Vitals:   04/27/19 1615 04/27/19 1630 04/27/19 1730 04/27/19 1800  BP: 106/71 125/79  132/77  Pulse: (!) 124 (!) 127 (!) 124 (!) 125  Resp: (!) 27 15 12  (!) 23  Temp:      TempSrc:      SpO2: 100% 97% 100%   Weight:         . General:  Appears calm and comfortable and is NAD . Eyes:  PERRL, EOMI, normal lids, iris . ENT:  grossly normal hearing, lips & tongue, mmm . Neck:  no LAD, masses or thyromegaly . Cardiovascular:  RR with tachycardia, no m/r/g. . Respiratory:   CTA bilaterally with no wheezes/rales/rhonchi.  Normal respiratory effort. . Abdomen:  soft, epigastric TTP, ND, NABS . Back:   normal alignment, no CVAT . Skin:  no rash or induration seen on limited exam . Musculoskeletal:  S/p B AKA . Psychiatric:  grossly normal mood and affect, speech fluent and appropriate, AOx3 . Neurologic:  CN 2-12 grossly intact, moves all extremities in coordinated fashion    Radiological Exams on Admission: CT Abdomen Pelvis W Contrast  Result Date: 04/27/2019 CLINICAL DATA:  Rectal bleeding EXAM: CT ABDOMEN AND PELVIS WITH CONTRAST TECHNIQUE: Multidetector CT imaging of the abdomen and pelvis was performed using the standard protocol following bolus administration of  intravenous contrast. CONTRAST:  156mL OMNIPAQUE IOHEXOL 300 MG/ML  SOLN COMPARISON:  None. FINDINGS: Lower chest: Tiny 2 mm nodule in the LEFT lower lobe (image 6/6). Hepatobiliary: No focal hepatic lesion. Multiple gallstones within the collapsed gallbladder. No biliary duct dilatation. Pancreas: Pancreas is normal. No ductal dilatation. No pancreatic inflammation. Spleen: Geographic low-attenuation region within the anterior pole of the spleen the suggest splenic infarction. Adrenals/urinary tract: Small round fat containing lesion of the LEFT adrenal gland measuring 17 mm on image 30/4). The ureters and bladder normal. Stomach/Bowel: Stomach, duodenum proximal small bowel normal. Distal small bowel normal. Terminal ileum normal. Appendix not identified. Ascending transverse colon normal. Scattered diverticular the descending colon without acute inflammation. Vascular/Lymphatic: Abdominal aorta is normal caliber with atherosclerotic calcification. There is no retroperitoneal or periportal lymphadenopathy. No pelvic lymphadenopathy. The IA, SMA and celiac trunk are patent Reproductive: Brachytherapy seeds in the prostate gland Other: No free fluid. Musculoskeletal: No aggressive osseous lesion. IMPRESSION: 1. No explanation for rectal bleeding. Scattered diverticulosis without evidence of diverticulitis. 2. Aortic Atherosclerosis (ICD10-I70.0).  Branch vessels are patent. 3. 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 Benign myelolipoma of the LEFT adrenal gland. 5. Very small LEFT lobe pulmonary nodule is favored benign. No follow-up needed if patient is low-risk. Non-contrast chest CT can be considered in 12 months if patient is high-risk. This recommendation follows the consensus statement: Guidelines for Management of Incidental Pulmonary Nodules Detected on CT Images: From the Fleischner Society 2017; Radiology 2017; 284:228-243. Electronically Signed   By: Suzy Bouchard M.D.   On: 04/27/2019  14:22    EKG: Independently reviewed.  Sinus tachcardia with rate 130; nonspecific ST changes with no evidence of acute ischemia   Labs on Admission: I have personally reviewed the available labs and imaging studies at the time of the admission.  Pertinent labs:   K+ 6.6 -> 7.1 Glucose 307 BUN 37 -> 42 Heme positive WBC 18.5 Hgb 17.4 Platelets 528   Assessment/Plan Principal Problem:   Acute upper GI bleed Active Problems:   Obstructive sleep apnea   Type 2 diabetes mellitus with diabetic polyneuropathy, with long-term current use of insulin (HCC)   Chronic renal disease, stage III   Essential hypertension   PVD (peripheral vascular disease) (HCC)   Hyperkalemia   Obesity (BMI 30.0-34.9)   GI bleeding -Patient presenting with dark bloody stools x 5-6 times today -He is having diffuse but particularly midepigastric abdominal pain -+nausea, vomiting x 1 -Elevated BUN -Appears likely due to upper GI bleeding.  -Differential diagnoses includes gastric or duodenal ulcer vs. Esophagitis; lower GI bleed is less likely -His Hgb is elevated but will trend q6h -The patient is currently mildly tachycardic, suggesting acute volume loss.  -Type and screen were done in ED.  -will observe in tele bed -GI consulted by ED, will follow up recommendations -NPO after MN for possible EGD -LR at 150 mL/hr -Start IV pantoprazole 40 mg bid -Zofran IV for nausea -Avoid NSAIDs and SQ heparin -Maintain IV access (2 large bore IVs if possible). -Monitor closely and follow q6h cbc, transfuse as necessary.  Hyperkalemia -Patient with acute/subacute hyperkalemia which appears to be resulting from volume deficiency in setting of HCTZ/ARB use -Was given 1L NS bolus, Calcium, and insulin/glucose in ER -Continue to closely  monitor on telemetry -Will continue to replete at 150 cc/hour  -He has an order for q4h K+ - but if it is continuing to rise then will consult nephrology urgently for  consideration of HD -Will recheck EKG now -Will hold Hyzaar  HTN -Continue Coreg -Hold Hyzaar, as noted  Stage 2-3a CKD -Appears to be stable at this time  OSA -Continue CPAP  PVD -s/p B AKA  DM -Will check A1c -hold Glucophage -Cover with moderate-scale SSI  Obesity Body mass index is 33.38 kg/m.  -Weight loss should be encouraged; this will be challenging given his B amputations -Outpatient PCP/bariatric medicine f/u encouraged    Note: This patient has been tested and is negative for the novel coronavirus COVID-19.  DVT prophylaxis: None Code Status:  Full - confirmed with patient/family Family Communication: Celesta Gentile was present throughout evaluation. Disposition Plan:  The patient is from: home  Anticipated d/c is to: home without [facility] services once his GI issues have been resolved.  Anticipated d/c date will depend on clinical response to treatment, but possibly as early as tomorrow if he has excellent response to treatment  Patient is currently: acutely ill Consults called: GI Admission status:  It is my clinical opinion that referral for OBSERVATION is reasonable and necessary in this patient based on the above information provided. The aforementioned taken together are felt to place the patient at high risk for further clinical deterioration. However it is anticipated that the patient may be medically stable for discharge from the hospital within 24 to 48 hours.    Karmen Bongo MD [facility]   How to contact the [facility] Attending or Consulting provider [place] or covering provider during after hours [place], for this patient?  1. Check the care team in [facility] and look for a) attending/consulting [facility] provider listed and b) the [facility] team listed 2. Log into [email or web address] and use [place]'s universal password to access. If you do not have the password, please contact the hospital operator. 3. Locate the [facility] provider you are looking for under [facility]  and page to a number that you can be directly reached. 4. If you still have difficulty reaching the provider, please page the [facility] (Director on Call) for the Hospitalists listed on amion for assistance.   04/27/2019, 6:37 PM

## 2019-04-27 NOTE — ED Notes (Signed)
Paged Dr Myna Hidalgo to RN [facility]

## 2019-04-27 NOTE — ED Notes (Signed)
Ordered Dinner Tray  

## 2019-04-27 NOTE — ED Notes (Signed)
Pt had an episode of bright red bloody stool while in the waiting area. Pt became very diaphoretic

## 2019-04-27 NOTE — ED Notes (Signed)
Dr. Eulis Foster made aware of patient's potassium of 7.1

## 2019-04-27 NOTE — ED Triage Notes (Signed)
Pt reports bright red blood per rectum since this morning. Pt reports some abd pain, nausea no vomiting.

## 2019-04-28 DIAGNOSIS — K5520 Angiodysplasia of colon without hemorrhage: Secondary | ICD-10-CM | POA: Diagnosis present

## 2019-04-28 DIAGNOSIS — E669 Obesity, unspecified: Secondary | ICD-10-CM

## 2019-04-28 DIAGNOSIS — E1142 Type 2 diabetes mellitus with diabetic polyneuropathy: Secondary | ICD-10-CM

## 2019-04-28 DIAGNOSIS — K648 Other hemorrhoids: Secondary | ICD-10-CM | POA: Diagnosis not present

## 2019-04-28 DIAGNOSIS — E875 Hyperkalemia: Secondary | ICD-10-CM

## 2019-04-28 DIAGNOSIS — Z8546 Personal history of malignant neoplasm of prostate: Secondary | ICD-10-CM | POA: Diagnosis not present

## 2019-04-28 DIAGNOSIS — K625 Hemorrhage of anus and rectum: Secondary | ICD-10-CM | POA: Diagnosis present

## 2019-04-28 DIAGNOSIS — E785 Hyperlipidemia, unspecified: Secondary | ICD-10-CM | POA: Diagnosis present

## 2019-04-28 DIAGNOSIS — E1122 Type 2 diabetes mellitus with diabetic chronic kidney disease: Secondary | ICD-10-CM | POA: Diagnosis present

## 2019-04-28 DIAGNOSIS — I10 Essential (primary) hypertension: Secondary | ICD-10-CM

## 2019-04-28 DIAGNOSIS — N1831 Chronic kidney disease, stage 3a: Secondary | ICD-10-CM | POA: Diagnosis present

## 2019-04-28 DIAGNOSIS — K922 Gastrointestinal hemorrhage, unspecified: Secondary | ICD-10-CM | POA: Diagnosis not present

## 2019-04-28 DIAGNOSIS — G4733 Obstructive sleep apnea (adult) (pediatric): Secondary | ICD-10-CM

## 2019-04-28 DIAGNOSIS — N529 Male erectile dysfunction, unspecified: Secondary | ICD-10-CM | POA: Diagnosis present

## 2019-04-28 DIAGNOSIS — Z923 Personal history of irradiation: Secondary | ICD-10-CM | POA: Diagnosis not present

## 2019-04-28 DIAGNOSIS — N179 Acute kidney failure, unspecified: Secondary | ICD-10-CM | POA: Diagnosis present

## 2019-04-28 DIAGNOSIS — E1151 Type 2 diabetes mellitus with diabetic peripheral angiopathy without gangrene: Secondary | ICD-10-CM | POA: Diagnosis present

## 2019-04-28 DIAGNOSIS — Z89611 Acquired absence of right leg above knee: Secondary | ICD-10-CM | POA: Diagnosis not present

## 2019-04-28 DIAGNOSIS — Z794 Long term (current) use of insulin: Secondary | ICD-10-CM | POA: Diagnosis not present

## 2019-04-28 DIAGNOSIS — Z888 Allergy status to other drugs, medicaments and biological substances status: Secondary | ICD-10-CM | POA: Diagnosis not present

## 2019-04-28 DIAGNOSIS — R911 Solitary pulmonary nodule: Secondary | ICD-10-CM | POA: Diagnosis present

## 2019-04-28 DIAGNOSIS — Z6833 Body mass index (BMI) 33.0-33.9, adult: Secondary | ICD-10-CM | POA: Diagnosis not present

## 2019-04-28 DIAGNOSIS — Z20822 Contact with and (suspected) exposure to covid-19: Secondary | ICD-10-CM | POA: Diagnosis present

## 2019-04-28 DIAGNOSIS — K5791 Diverticulosis of intestine, part unspecified, without perforation or abscess with bleeding: Secondary | ICD-10-CM | POA: Diagnosis not present

## 2019-04-28 DIAGNOSIS — K5521 Angiodysplasia of colon with hemorrhage: Secondary | ICD-10-CM | POA: Diagnosis not present

## 2019-04-28 DIAGNOSIS — I739 Peripheral vascular disease, unspecified: Secondary | ICD-10-CM | POA: Diagnosis not present

## 2019-04-28 DIAGNOSIS — I2510 Atherosclerotic heart disease of native coronary artery without angina pectoris: Secondary | ICD-10-CM | POA: Diagnosis present

## 2019-04-28 DIAGNOSIS — K5731 Diverticulosis of large intestine without perforation or abscess with bleeding: Secondary | ICD-10-CM | POA: Diagnosis present

## 2019-04-28 DIAGNOSIS — E1165 Type 2 diabetes mellitus with hyperglycemia: Secondary | ICD-10-CM | POA: Diagnosis present

## 2019-04-28 DIAGNOSIS — I129 Hypertensive chronic kidney disease with stage 1 through stage 4 chronic kidney disease, or unspecified chronic kidney disease: Secondary | ICD-10-CM | POA: Diagnosis present

## 2019-04-28 DIAGNOSIS — K449 Diaphragmatic hernia without obstruction or gangrene: Secondary | ICD-10-CM | POA: Diagnosis not present

## 2019-04-28 DIAGNOSIS — D62 Acute posthemorrhagic anemia: Secondary | ICD-10-CM | POA: Diagnosis present

## 2019-04-28 DIAGNOSIS — Z89612 Acquired absence of left leg above knee: Secondary | ICD-10-CM | POA: Diagnosis not present

## 2019-04-28 LAB — CBC
HCT: 30.2 % — ABNORMAL LOW (ref 39.0–52.0)
HCT: 28.6 % — ABNORMAL LOW (ref 39.0–52.0)
HCT: 23.5 % — ABNORMAL LOW (ref 39.0–52.0)
Hemoglobin: 9.6 g/dL — ABNORMAL LOW (ref 13.0–17.0)
Hemoglobin: 9.3 g/dL — ABNORMAL LOW (ref 13.0–17.0)
Hemoglobin: 7.7 g/dL — ABNORMAL LOW (ref 13.0–17.0)
MCH: 24.8 pg — ABNORMAL LOW (ref 26.0–34.0)
MCH: 25.3 pg — ABNORMAL LOW (ref 26.0–34.0)
MCH: 25.2 pg — ABNORMAL LOW (ref 26.0–34.0)
MCHC: 31.8 g/dL (ref 30.0–36.0)
MCHC: 32.5 g/dL (ref 30.0–36.0)
MCHC: 32.8 g/dL (ref 30.0–36.0)
MCV: 78 fL — ABNORMAL LOW (ref 80.0–100.0)
MCV: 77.7 fL — ABNORMAL LOW (ref 80.0–100.0)
MCV: 76.8 fL — ABNORMAL LOW (ref 80.0–100.0)
Platelets: 465 10*3/uL — ABNORMAL HIGH (ref 150–400)
Platelets: 441 10*3/uL — ABNORMAL HIGH (ref 150–400)
Platelets: 351 10*3/uL (ref 150–400)
RBC: 3.87 MIL/uL — ABNORMAL LOW (ref 4.22–5.81)
RBC: 3.68 MIL/uL — ABNORMAL LOW (ref 4.22–5.81)
RBC: 3.06 MIL/uL — ABNORMAL LOW (ref 4.22–5.81)
RDW: 18.7 % — ABNORMAL HIGH (ref 11.5–15.5)
RDW: 18.6 % — ABNORMAL HIGH (ref 11.5–15.5)
RDW: 18.7 % — ABNORMAL HIGH (ref 11.5–15.5)
WBC: 22.8 10*3/uL — ABNORMAL HIGH (ref 4.0–10.5)
WBC: 20.9 10*3/uL — ABNORMAL HIGH (ref 4.0–10.5)
WBC: 13.9 10*3/uL — ABNORMAL HIGH (ref 4.0–10.5)
nRBC: 0.4 % — ABNORMAL HIGH (ref 0.0–0.2)
nRBC: 0.4 % — ABNORMAL HIGH (ref 0.0–0.2)
nRBC: 0.2 % (ref 0.0–0.2)

## 2019-04-28 LAB — GLUCOSE, CAPILLARY
Glucose-Capillary: 238 mg/dL — ABNORMAL HIGH (ref 70–99)
Glucose-Capillary: 191 mg/dL — ABNORMAL HIGH (ref 70–99)
Glucose-Capillary: 206 mg/dL — ABNORMAL HIGH (ref 70–99)
Glucose-Capillary: 121 mg/dL — ABNORMAL HIGH (ref 70–99)

## 2019-04-28 LAB — URINALYSIS, COMPLETE (UACMP) WITH MICROSCOPIC
Bacteria, UA: NONE SEEN
Bilirubin Urine: NEGATIVE
Glucose, UA: 50 mg/dL — AB
Hgb urine dipstick: NEGATIVE
Ketones, ur: NEGATIVE mg/dL
Leukocytes,Ua: NEGATIVE
Nitrite: NEGATIVE
Protein, ur: NEGATIVE mg/dL
Specific Gravity, Urine: 1.015 (ref 1.005–1.030)
pH: 5 (ref 5.0–8.0)

## 2019-04-28 LAB — PREPARE RBC (CROSSMATCH)

## 2019-04-28 LAB — SARS CORONAVIRUS 2 (TAT 6-24 HRS): SARS Coronavirus 2: NEGATIVE

## 2019-04-28 LAB — POTASSIUM
Potassium: 5.8 mmol/L — ABNORMAL HIGH (ref 3.5–5.1)
Potassium: 5.1 mmol/L (ref 3.5–5.1)
Potassium: 5.1 mmol/L (ref 3.5–5.1)

## 2019-04-28 LAB — BASIC METABOLIC PANEL
Anion gap: 8 (ref 5–15)
BUN: 72 mg/dL — ABNORMAL HIGH (ref 8–23)
CO2: 22 mmol/L (ref 22–32)
Calcium: 8.9 mg/dL (ref 8.9–10.3)
Chloride: 104 mmol/L (ref 98–111)
Creatinine, Ser: 1.62 mg/dL — ABNORMAL HIGH (ref 0.61–1.24)
GFR calc Af Amer: 49 mL/min — ABNORMAL LOW (ref >60)
GFR calc non Af Amer: 42 mL/min — ABNORMAL LOW (ref >60)
Glucose, Bld: 283 mg/dL — ABNORMAL HIGH (ref 70–99)
Potassium: 5.9 mmol/L — ABNORMAL HIGH (ref 3.5–5.1)
Sodium: 134 mmol/L — ABNORMAL LOW (ref 135–145)

## 2019-04-28 LAB — CBG MONITORING, ED: Glucose-Capillary: 258 mg/dL — ABNORMAL HIGH (ref 70–99)

## 2019-04-28 LAB — CREATININE, URINE, RANDOM: Creatinine, Urine: 48.02 mg/dL

## 2019-04-28 LAB — NA AND K (SODIUM & POTASSIUM), RAND UR
Potassium Urine: 58 mmol/L
Sodium, Ur: 49 mmol/L

## 2019-04-28 LAB — HIV ANTIBODY (ROUTINE TESTING W REFLEX): HIV Screen 4th Generation wRfx: NONREACTIVE

## 2019-04-28 MED ORDER — INSULIN GLARGINE 100 UNIT/ML ~~LOC~~ SOLN
56.0000 [IU] | Freq: Every day | SUBCUTANEOUS | Status: DC
  Filled 2019-04-28: qty 0.56

## 2019-04-28 MED ORDER — SODIUM CHLORIDE 0.9 % IV BOLUS
500.0000 mL | Freq: Once | INTRAVENOUS | Status: AC
  Administered 2019-04-28: 500 mL via INTRAVENOUS

## 2019-04-28 MED ORDER — PEG-KCL-NACL-NASULF-NA ASC-C 100 G PO SOLR
0.5000 | Freq: Once | ORAL | Status: AC
  Administered 2019-04-28: 100 g via ORAL
  Filled 2019-04-28: qty 1

## 2019-04-28 MED ORDER — METOCLOPRAMIDE HCL 5 MG/ML IJ SOLN
10.0000 mg | Freq: Once | INTRAMUSCULAR | Status: AC
  Administered 2019-04-28: 10 mg via INTRAVENOUS
  Filled 2019-04-28: qty 2

## 2019-04-28 MED ORDER — SODIUM CHLORIDE 0.9% IV SOLUTION: Freq: Once | INTRAVENOUS | Status: DC

## 2019-04-28 MED ORDER — PEG-KCL-NACL-NASULF-NA ASC-C 100 G PO SOLR
0.5000 | Freq: Once | ORAL | Status: AC
  Administered 2019-04-28: 100 g via ORAL

## 2019-04-28 MED ORDER — PANTOPRAZOLE SODIUM 40 MG PO TBEC
40.0000 mg | DELAYED_RELEASE_TABLET | Freq: Every day | ORAL | Status: DC
  Administered 2019-04-29: 40 mg via ORAL
  Filled 2019-04-28: qty 1

## 2019-04-28 MED ORDER — METOCLOPRAMIDE HCL 5 MG/ML IJ SOLN
10.0000 mg | Freq: Once | INTRAMUSCULAR | Status: AC
  Administered 2019-04-29: 10 mg via INTRAVENOUS
  Filled 2019-04-28: qty 2

## 2019-04-28 MED ORDER — METOPROLOL TARTRATE 5 MG/5ML IV SOLN: 2.5000 mg | Freq: Four times a day (QID) | INTRAVENOUS | Status: DC | PRN

## 2019-04-28 MED ORDER — CARVEDILOL 12.5 MG PO TABS: 12.5000 mg | ORAL_TABLET | Freq: Two times a day (BID) | ORAL | Status: DC

## 2019-04-28 MED ORDER — PEG-KCL-NACL-NASULF-NA ASC-C 100 G PO SOLR: 1.0000 | Freq: Once | ORAL | Status: DC

## 2019-04-28 MED ORDER — INSULIN GLARGINE 100 UNIT/ML ~~LOC~~ SOLN
20.0000 [IU] | Freq: Every day | SUBCUTANEOUS | Status: AC
  Administered 2019-04-28: 20 [IU] via SUBCUTANEOUS
  Filled 2019-04-28: qty 0.2

## 2019-04-28 MED ORDER — BISACODYL 5 MG PO TBEC
20.0000 mg | DELAYED_RELEASE_TABLET | Freq: Once | ORAL | Status: AC
  Administered 2019-04-28: 20 mg via ORAL
  Filled 2019-04-28: qty 4

## 2019-04-28 NOTE — Progress Notes (Signed)
PROGRESS NOTE    [name]  FAO:[id number] DOB: 03-Aug-1950 DOA: 04/27/2019 PCP: [name], MD   Chief Complaint  Patient presents with  . Rectal Bleeding    Brief Narrative:  As per H&P written by Dr. Lorin Mercy on 04/27/2019 69 y.o. male with medical history significant of OSAon CPAP; DM; PVD s/pBLE amputations; HLD; CAD; and remote GI bleed presenting with GI bleeding. He got up this AM and went to the bathroom and the toilet was full of blood - sort of dark, clotted blood. There was stool in it that was ok. About 30-45 minutes later he went again and it was all blood, the same color. It happened one more time at home and 3 times in the ER (maybe 4). +nausea, emesis x 1 and nonbloody. +diffuse abdominal pain. No fever but he has been flushed and sweaty. Not lightheaded, dizzy, SOB since vomiting. Yesterday he was fine.   ED Course:  Upper GI bleed - elevated BUN, melena.  Hgb >15.  Incidental hyperkalemia - likely taking too much.  Given insulin, calcium gluconate, IVF.  GI to see.   Assessment & Plan: 1-acute blood loss anemia in the setting of GI bleed -Per patient descriptions sounds most likely to be lower GI bleed; improved following GI recommendations started on IV Protonix. -Clear liquid diet will be allowed until midnight. -Planning for bowel preparation and endoscopy/colonoscopy 04/29/2019 -Hemoglobin 9.3; will follow trend and transfuse for less than 8.5 in the setting of acute blood loss and symptomatic anemia. -Continue avoiding NSAIDs and heparin products/anticoagulation.  2-obesity/obstructive sleep apnea -Body mass index is 33.51 kg/m. -Low calorie diet and portion control discussed with patient -Continue the use of CPAP nightly  3-Type 2 diabetes mellitus with diabetic polyneuropathy and  nephropathy with long-term current use of insulin (HCC) -Continue to follow CBGs and continue sliding scale insulin and Lantus.  4-Chronic renal disease,  stage IIIa -Creatinine level very close to his baseline -Continue avoiding nephrotoxic agents and hypotension -Continue IV fluid -Follow renal function trend.  5-Essential hypertension -Holding antihypertensive agents in the setting of soft blood pressure.  6-Hyperkalemia -Status post calcium gluconate, bicarbonate and insulin -Patient has been started on [place] given overnight -Potassium currently 5.9 -Continue to follow trend and monitor on telemetry   DVT prophylaxis: No anticoagulation in the setting of acute bleeding.  Due to AKA bilaterally no SCDs Code Status: Full code Family Communication: Wife at bedside. Disposition:   Status is: Inpatient  Dispo: The patient is from: Home              Anticipated d/c is to: Home              Anticipated d/c date is: 04/30/19              Patient currently No medically stable in the setting of ongoing bloody stools, intermittent episode of tachycardia and soft blood pressure.  Plan is for endoscopy and colonoscopy on 04/29/2019.  Continue supportive care follow clinical response.  Will transfuse for hemoglobin less than 8.5.        Consultants:   [facility] (Dr. Lyndel Safe)   Procedures:   See below for x-ray reports  Endoscopy and colonoscopy 04/29/2019   Antimicrobials:   None   Subjective: Intermittent episodes of hypotension and tachycardia throughout the day; no chest pain, no palpitations, no nausea, no vomiting.  Reports no shortness of breath.  Still having intermittent episode of bloody stools.  Objective: Vitals:  04/28/19 1415 04/28/19 1430 04/28/19 1445 04/28/19 1500  BP: (!) 87/47 (!) 90/51 (!) 94/49 (!) 89/50  Pulse:   (!) 101   Resp:      Temp:      TempSrc:      SpO2:   97%   Weight:      Height:        Intake/Output Summary (Last 24 hours) at 04/28/2019 1540 Last data filed at 04/28/2019 1310 Gross per 24 hour  Intake 2350.83 ml  Output 450 ml  Net 1900.83 ml    Filed Weights   04/27/19 1041 04/28/19 0244 04/28/19 0500  Weight: 117.9 kg 118.4 kg 118.4 kg    Examination:  General exam: Appears calm and comfortable; still reporting ongoing bloody stools and feeling weak/tired.  No chest pain, no palpitations, no nausea, no vomiting. Respiratory system: Clear to auscultation. Respiratory effort normal. Cardiovascular system: S1 & S2 heard, RRR. No JVD, murmurs, rubs, gallops or clicks. No pedal edema. Gastrointestinal system: Abdomen is obese, nondistended, soft and nontender. No organomegaly or masses felt. Normal bowel sounds heard. Central nervous system: Alert and oriented. No focal neurological deficits. Extremities: Bilateral AKA; no cyanosis or clubbing. Skin: No rashes, no petechiae. Psychiatry: Judgement and insight appear normal. Mood & affect appropriate.     Data Reviewed: I have personally reviewed following labs and imaging studies  CBC: Recent Labs  Lab 04/27/19 1130 04/27/19 1933 04/28/19 0506 04/28/19 0643  WBC 18.5* 27.4* 22.8* 20.9*  HGB 17.4* 12.8* 9.6* 9.3*  HCT 55.2* 41.9 30.2* 28.6*  MCV 79.2* 80.9 78.0* 77.7*  PLT 528* 598* 465* 441*    Basic Metabolic Panel: Recent Labs  Lab 04/27/19 1130 04/27/19 1130 04/27/19 1314 04/27/19 1835 04/27/19 1933 04/28/19 0200 04/28/19 0302 04/28/19 0643 04/28/19 1017  NA 141  --  141  --   --   --  134*  --   --   K 6.6*   < > 7.1*   < > 7.5* 5.8* 5.9* 5.1 5.1  CL 105  --  104  --   --   --  104  --   --   CO2 23  --  25  --   --   --  22  --   --   GLUCOSE 271*  --  307*  --   --   --  283*  --   --   BUN 37*  --  42*  --   --   --  72*  --   --   CREATININE 1.10  --  1.11  --   --   --  1.62*  --   --   CALCIUM 9.9  --  9.5  --   --   --  8.9  --   --    < > = values in this interval not displayed.    GFR: Estimated Creatinine Clearance: 58 mL/min (A) (by C-G formula based on SCr of 1.62 mg/dL (H)).  Liver Function Tests: Recent Labs  Lab 04/27/19 1130   AST 21  ALT 22  ALKPHOS 46  BILITOT 0.9  PROT 7.2  ALBUMIN 4.0    CBG: Recent Labs  Lab 04/27/19 1952 04/27/19 2209 04/28/19 0011 04/28/19 0727 04/28/19 1120  GLUCAP 317* 412* 258* 238* 191*     Recent Results (from the past 240 hour(s))  SARS CORONAVIRUS 2 (TAT 6-24 HRS) Nasopharyngeal Nasopharyngeal Swab     Status: None   Collection Time:  04/27/19  7:19 PM   Specimen: Nasopharyngeal Swab  Result Value Ref Range Status   SARS Coronavirus 2 NEGATIVE NEGATIVE Final    Comment: (NOTE) SARS-CoV-2 target nucleic acids are NOT DETECTED. The SARS-CoV-2 RNA is generally detectable in upper and lower respiratory specimens during the acute phase of infection. Negative results do not preclude SARS-CoV-2 infection, do not rule out co-infections with other pathogens, and should not be used as the sole basis for treatment or other patient management decisions. Negative results must be combined with clinical observations, patient history, and epidemiological information. The expected result is Negative. Fact Sheet for Patients: SugarRoll.be Fact Sheet for Healthcare Providers: https://www.woods-mathews.com/ This test is not yet approved or cleared by the [place] FDA and  has been authorized for detection and/or diagnosis of SARS-CoV-2 by FDA under an Emergency Use Authorization (EUA). This EUA will remain  in effect (meaning this test can be used) for the duration of the COVID-19 declaration under Section 56 4(b)(1) of the Act, 21 U.S.C. section 360bbb-3(b)(1), unless the authorization is terminated or revoked sooner. Performed at [facility], [place] [address], [place], [place] [address]      Radiology Studies: CT Abdomen Pelvis W Contrast  Result Date: 04/27/2019 CLINICAL DATA:  Rectal bleeding EXAM: CT ABDOMEN AND PELVIS WITH CONTRAST TECHNIQUE: Multidetector CT imaging of the abdomen and pelvis was performed using the  standard protocol following bolus administration of intravenous contrast. CONTRAST:  133m OMNIPAQUE IOHEXOL 300 MG/ML  SOLN COMPARISON:  None. FINDINGS: Lower chest: Tiny 2 mm nodule in the LEFT lower lobe (image 6/6). Hepatobiliary: No focal hepatic lesion. Multiple gallstones within the collapsed gallbladder. No biliary duct dilatation. Pancreas: Pancreas is normal. No ductal dilatation. No pancreatic inflammation. Spleen: Geographic low-attenuation region within the anterior pole of the spleen the suggest splenic infarction. Adrenals/urinary tract: Small round fat containing lesion of the LEFT adrenal gland measuring 17 mm on image 30/4). The ureters and bladder normal. Stomach/Bowel: Stomach, duodenum proximal small bowel normal. Distal small bowel normal. Terminal ileum normal. Appendix not identified. Ascending transverse colon normal. Scattered diverticular the descending colon without acute inflammation. Vascular/Lymphatic: Abdominal aorta is normal caliber with atherosclerotic calcification. There is no retroperitoneal or periportal lymphadenopathy. No pelvic lymphadenopathy. The IA, SMA and celiac trunk are patent Reproductive: Brachytherapy seeds in the prostate gland Other: No free fluid. Musculoskeletal: No aggressive osseous lesion. IMPRESSION: 1. No explanation for rectal bleeding. Scattered diverticulosis without evidence of diverticulitis. 2. Aortic Atherosclerosis (ICD10-I70.0).  Branch vessels are patent. 3. 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 Benign myelolipoma of the LEFT adrenal gland. 5. Very small LEFT lobe pulmonary nodule is favored benign. No follow-up needed if patient is low-risk. Non-contrast chest CT can be considered in 12 months if patient is high-risk. This recommendation follows the consensus statement: Guidelines for Management of Incidental Pulmonary Nodules Detected on CT Images: From the Fleischner Society 2017; Radiology 2017; 284:228-243. Electronically  Signed   By: SSuzy BouchardM.D.   On: 04/27/2019 14:22    Scheduled Meds: . carvedilol  12.5 mg Oral BID WC  . insulin aspart  0-15 Units Subcutaneous TID WC  . insulin aspart  0-5 Units Subcutaneous QHS  . insulin glargine  56 Units Subcutaneous QHS  . metoCLOPramide (REGLAN) injection  10 mg Intravenous Once   Followed by  . metoCLOPramide (REGLAN) injection  10 mg Intravenous Once  . [START ON 04/29/2019] pantoprazole  40 mg Oral Q0600  . peg 3350 powder  0.5 kit Oral Once   And  . peg 3350 powder  0.5 kit Oral Once  . sodium chloride flush  3 mL Intravenous Q12H  . sodium zirconium cyclosilicate  10 g Oral N1W   Continuous Infusions: . lactated ringers 100 mL/hr at 04/28/19 1115  . sodium chloride       LOS: 0 days    Time spent: 35 minutes    Barton Dubois, MD [facility]   To contact the attending provider between 7A-7P or the covering provider during after hours 7P-7A, please log into the web site [email or web address] and access using universal [place] password for that web site. If you do not have the password, please call the hospital operator.  04/28/2019, 3:40 PM

## 2019-04-28 NOTE — Progress Notes (Addendum)
Daily Rounding Note  04/28/2019, 10:32 AM  LOS: 0 days   SUBJECTIVE:   Chief complaint: Painless hematochezia. Had a few more episodes yesterday afternoon and one this morning.  The interval between episodes has lengthened.  Stool is dark red.  Still no abdominal pain. Tachy to 1teens, low 120s.  BPs low 100s/50s-60s.   Decreased diaphoresis but now at his baseline diaphoresis which have been abnormally elevated in recent months. Denies SOB, weakness, abdominal pain. Pt and his partner are under the impression that he is having colonoscopy tomorrow around 9 AM but he is not on the schedule as of now.  OBJECTIVE:         Vital signs in last 24 hours:    Temp:  [97.6 F (36.4 C)-98.2 F (36.8 C)] 98.1 F (36.7 C) (04/21 0500) Pulse Rate:  [113-133] 117 (04/21 0334) Resp:  [12-27] 16 (04/21 0500) BP: (100-165)/(56-119) 108/61 (04/21 0500) SpO2:  [93 %-100 %] 98 % (04/21 0500) Weight:  [117.9 kg-118.4 kg] 118.4 kg (04/21 0500) Last BM Date: 04/28/19 Filed Weights   04/27/19 1041 04/28/19 0244 04/28/19 0500  Weight: 117.9 kg 118.4 kg 118.4 kg   General: Looks unwell generally,  chronically ill.  No acute distress.  Overall improved from yesterday.  Comfortable. Heart: RRR. Chest: Clear bilaterally.  No dyspnea.  No cough. Abdomen: Obese, nontender, active bowel sounds. Extremities: Bilateral AKA. Neuro/Psych: Alert.  Appropriate.  Intake/Output from previous day: 04/20 0701 - 04/21 0700 In: 2370.8 [I.V.:1320.8; IV Piggyback:1050] Out: 1 [Stool:1]  Intake/Output this shift: Total I/O In: 0  Out: 250 [Urine:250]  Lab Results: Recent Labs    04/27/19 1933 04/28/19 0506 04/28/19 0643  WBC 27.4* 22.8* 20.9*  HGB 12.8* 9.6* 9.3*  HCT 41.9 30.2* 28.6*  PLT 598* 465* 441*   BMET Recent Labs    04/27/19 1130 04/27/19 1130 04/27/19 1314 04/27/19 1835 04/28/19 0200 04/28/19 0302 04/28/19 0643  NA 141  --   141  --   --  134*  --   K 6.6*   < > 7.1*   < > 5.8* 5.9* 5.1  CL 105  --  104  --   --  104  --   CO2 23  --  25  --   --  22  --   GLUCOSE 271*  --  307*  --   --  283*  --   BUN 37*  --  42*  --   --  72*  --   CREATININE 1.10  --  1.11  --   --  1.62*  --   CALCIUM 9.9  --  9.5  --   --  8.9  --    < > = values in this interval not displayed.   LFT Recent Labs    04/27/19 1130  PROT 7.2  ALBUMIN 4.0  AST 21  ALT 22  ALKPHOS 46  BILITOT 0.9   PT/INR No results for input(s): LABPROT, INR in the last 72 hours. Hepatitis Panel No results for input(s): HEPBSAG, HCVAB, HEPAIGM, HEPBIGM in the last 72 hours.  Studies/Results: CT Abdomen Pelvis W Contrast  Result Date: 04/27/2019 CLINICAL DATA:  Rectal bleeding EXAM: CT ABDOMEN AND PELVIS WITH CONTRAST TECHNIQUE: Multidetector CT imaging of the abdomen and pelvis was performed using the standard protocol following bolus administration of intravenous contrast. CONTRAST:  169mL OMNIPAQUE IOHEXOL 300 MG/ML  SOLN COMPARISON:  None. FINDINGS: Lower chest: Tiny  2 mm nodule in the LEFT lower lobe (image 6/6). Hepatobiliary: No focal hepatic lesion. Multiple gallstones within the collapsed gallbladder. No biliary duct dilatation. Pancreas: Pancreas is normal. No ductal dilatation. No pancreatic inflammation. Spleen: Geographic low-attenuation region within the anterior pole of the spleen the suggest splenic infarction. Adrenals/urinary tract: Small round fat containing lesion of the LEFT adrenal gland measuring 17 mm on image 30/4). The ureters and bladder normal. Stomach/Bowel: Stomach, duodenum proximal small bowel normal. Distal small bowel normal. Terminal ileum normal. Appendix not identified. Ascending transverse colon normal. Scattered diverticular the descending colon without acute inflammation. Vascular/Lymphatic: Abdominal aorta is normal caliber with atherosclerotic calcification. There is no retroperitoneal or periportal  lymphadenopathy. No pelvic lymphadenopathy. The IA, SMA and celiac trunk are patent Reproductive: Brachytherapy seeds in the prostate gland Other: No free fluid. Musculoskeletal: No aggressive osseous lesion. IMPRESSION: 1. No explanation for rectal bleeding. Scattered diverticulosis without evidence of diverticulitis. 2. Aortic Atherosclerosis (ICD10-I70.0).  Branch vessels are patent. 3. 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 Benign myelolipoma of the LEFT adrenal gland. 5. Very small LEFT lobe pulmonary nodule is favored benign. No follow-up needed if patient is low-risk. Non-contrast chest CT can be considered in 12 months if patient is high-risk. This recommendation follows the consensus statement: Guidelines for Management of Incidental Pulmonary Nodules Detected on CT Images: From the Fleischner Society 2017; Radiology 2017; 284:228-243. Electronically Signed   By: Suzy Bouchard M.D.   On: 04/27/2019 14:22   Scheduled Meds: . carvedilol  25 mg Oral BID WC  . insulin aspart  0-15 Units Subcutaneous TID WC  . insulin aspart  0-5 Units Subcutaneous QHS  . insulin glargine  56 Units Subcutaneous QHS  . pantoprazole (PROTONIX) IV  40 mg Intravenous Q12H  . sodium chloride flush  3 mL Intravenous Q12H  . sodium zirconium cyclosilicate  10 g Oral [id number]   Continuous Infusions: . lactated ringers 125 mL/hr at 04/28/19 0600   PRN Meds:.acetaminophen **OR** acetaminophen, HYDROcodone-acetaminophen, morphine injection, ondansetron **OR** ondansetron (ZOFRAN) IV, zolpidem   ASSESMENT:   *  Painless hematochezia, lower GI bleed.  Suspect diverticular source. Over due for surveillance colonoscopy.  Adenomatous polyp 2009 and diverticulosis note at colonoscopy and 4/20 CT.  *    Pan cytosis.   Hgb now dropped to anemia: 17.4 >> 9.3.  WBCs and platelets still elevated.    *   Blood loss anemia.    *  Hyperkalemia.  Corrected.  K 5.1  *   AKI.    *   IDDM  PLAN   *     Colonoscopy.  Timing TBD.  We will try to get this arranged for tomorrow.  *   Continue the IV twice daily Protonix for now though low likelihood of upper GI bleed.    [name]  04/28/2019, 10:32 AM Phone [phone]    Attending physician's note   I have taken an interval history, reviewed the chart and examined the patient. I agree with the Advanced Practitioner's note, impression and recommendations.   GI bleed - upper vs lower For EGD/colonoscopy in a.m. Discussed with patient's wife Discussed with Dr. Armond Hang, MD [facility] [phone].

## 2019-04-28 NOTE — H&P (View-Only) (Signed)
Daily Rounding Note  04/28/2019, 10:32 AM  LOS: 0 days   SUBJECTIVE:   Chief complaint: Painless hematochezia. Had a few more episodes yesterday afternoon and one this morning.  The interval between episodes has lengthened.  Stool is dark red.  Still no abdominal pain. Tachy to 1teens, low 120s.  BPs low 100s/50s-60s.   Decreased diaphoresis but now at his baseline diaphoresis which have been abnormally elevated in recent months. Denies SOB, weakness, abdominal pain. Pt and his partner are under the impression that he is having colonoscopy tomorrow around 9 AM but he is not on the schedule as of now.  OBJECTIVE:         Vital signs in last 24 hours:    Temp:  [97.6 F (36.4 C)-98.2 F (36.8 C)] 98.1 F (36.7 C) (04/21 0500) Pulse Rate:  [113-133] 117 (04/21 0334) Resp:  [12-27] 16 (04/21 0500) BP: (100-165)/(56-119) 108/61 (04/21 0500) SpO2:  [93 %-100 %] 98 % (04/21 0500) Weight:  [117.9 kg-118.4 kg] 118.4 kg (04/21 0500) Last BM Date: 04/28/19 Filed Weights   04/27/19 1041 04/28/19 0244 04/28/19 0500  Weight: 117.9 kg 118.4 kg 118.4 kg   General: Looks unwell generally,  chronically ill.  No acute distress.  Overall improved from yesterday.  Comfortable. Heart: RRR. Chest: Clear bilaterally.  No dyspnea.  No cough. Abdomen: Obese, nontender, active bowel sounds. Extremities: Bilateral AKA. Neuro/Psych: Alert.  Appropriate.  Intake/Output from previous day: 04/20 0701 - 04/21 0700 In: 2370.8 [I.V.:1320.8; IV Piggyback:1050] Out: 1 [Stool:1]  Intake/Output this shift: Total I/O In: 0  Out: 250 [Urine:250]  Lab Results: Recent Labs    04/27/19 1933 04/28/19 0506 04/28/19 0643  WBC 27.4* 22.8* 20.9*  HGB 12.8* 9.6* 9.3*  HCT 41.9 30.2* 28.6*  PLT 598* 465* 441*   BMET Recent Labs    04/27/19 1130 04/27/19 1130 04/27/19 1314 04/27/19 1835 04/28/19 0200 04/28/19 0302 04/28/19 0643  NA 141  --   141  --   --  134*  --   K 6.6*   < > 7.1*   < > 5.8* 5.9* 5.1  CL 105  --  104  --   --  104  --   CO2 23  --  25  --   --  22  --   GLUCOSE 271*  --  307*  --   --  283*  --   BUN 37*  --  42*  --   --  72*  --   CREATININE 1.10  --  1.11  --   --  1.62*  --   CALCIUM 9.9  --  9.5  --   --  8.9  --    < > = values in this interval not displayed.   LFT Recent Labs    04/27/19 1130  PROT 7.2  ALBUMIN 4.0  AST 21  ALT 22  ALKPHOS 46  BILITOT 0.9   PT/INR No results for input(s): LABPROT, INR in the last 72 hours. Hepatitis Panel No results for input(s): HEPBSAG, HCVAB, HEPAIGM, HEPBIGM in the last 72 hours.  Studies/Results: CT Abdomen Pelvis W Contrast  Result Date: 04/27/2019 CLINICAL DATA:  Rectal bleeding EXAM: CT ABDOMEN AND PELVIS WITH CONTRAST TECHNIQUE: Multidetector CT imaging of the abdomen and pelvis was performed using the standard protocol following bolus administration of intravenous contrast. CONTRAST:  129mL OMNIPAQUE IOHEXOL 300 MG/ML  SOLN COMPARISON:  None. FINDINGS: Lower chest: Tiny  2 mm nodule in the LEFT lower lobe (image 6/6). Hepatobiliary: No focal hepatic lesion. Multiple gallstones within the collapsed gallbladder. No biliary duct dilatation. Pancreas: Pancreas is normal. No ductal dilatation. No pancreatic inflammation. Spleen: Geographic low-attenuation region within the anterior pole of the spleen the suggest splenic infarction. Adrenals/urinary tract: Small round fat containing lesion of the LEFT adrenal gland measuring 17 mm on image 30/4). The ureters and bladder normal. Stomach/Bowel: Stomach, duodenum proximal small bowel normal. Distal small bowel normal. Terminal ileum normal. Appendix not identified. Ascending transverse colon normal. Scattered diverticular the descending colon without acute inflammation. Vascular/Lymphatic: Abdominal aorta is normal caliber with atherosclerotic calcification. There is no retroperitoneal or periportal  lymphadenopathy. No pelvic lymphadenopathy. The IA, SMA and celiac trunk are patent Reproductive: Brachytherapy seeds in the prostate gland Other: No free fluid. Musculoskeletal: No aggressive osseous lesion. IMPRESSION: 1. No explanation for rectal bleeding. Scattered diverticulosis without evidence of diverticulitis. 2. Aortic Atherosclerosis (ICD10-I70.0).  Branch vessels are patent. 3. 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 Benign myelolipoma of the LEFT adrenal gland. 5. Very small LEFT lobe pulmonary nodule is favored benign. No follow-up needed if patient is low-risk. Non-contrast chest CT can be considered in 12 months if patient is high-risk. This recommendation follows the consensus statement: Guidelines for Management of Incidental Pulmonary Nodules Detected on CT Images: From the Fleischner Society 2017; Radiology 2017; 284:228-243. Electronically Signed   By: Suzy Bouchard M.D.   On: 04/27/2019 14:22   Scheduled Meds: . carvedilol  25 mg Oral BID WC  . insulin aspart  0-15 Units Subcutaneous TID WC  . insulin aspart  0-5 Units Subcutaneous QHS  . insulin glargine  56 Units Subcutaneous QHS  . pantoprazole (PROTONIX) IV  40 mg Intravenous Q12H  . sodium chloride flush  3 mL Intravenous Q12H  . sodium zirconium cyclosilicate  10 g Oral [id number]   Continuous Infusions: . lactated ringers 125 mL/hr at 04/28/19 0600   PRN Meds:.acetaminophen **OR** acetaminophen, HYDROcodone-acetaminophen, morphine injection, ondansetron **OR** ondansetron (ZOFRAN) IV, zolpidem   ASSESMENT:   *  Painless hematochezia, lower GI bleed.  Suspect diverticular source. Over due for surveillance colonoscopy.  Adenomatous polyp 2009 and diverticulosis note at colonoscopy and 4/20 CT.  *    Pan cytosis.   Hgb now dropped to anemia: 17.4 >> 9.3.  WBCs and platelets still elevated.    *   Blood loss anemia.    *  Hyperkalemia.  Corrected.  K 5.1  *   AKI.    *   IDDM  PLAN   *     Colonoscopy.  Timing TBD.  We will try to get this arranged for tomorrow.  *   Continue the IV twice daily Protonix for now though low likelihood of upper GI bleed.    Azucena Freed  04/28/2019, 10:32 AM Phone [phone]    Attending physician's note   I have taken an interval history, reviewed the chart and examined the patient. I agree with the Advanced Practitioner's note, impression and recommendations.   GI bleed - upper vs lower For EGD/colonoscopy in a.m. Discussed with patient's wife Discussed with Dr. Armond Hang, MD [facility] [phone].

## 2019-04-29 ENCOUNTER — Inpatient Hospital Stay (HOSPITAL_COMMUNITY): Payer: Medicare Other | Admitting: Anesthesiology

## 2019-04-29 ENCOUNTER — Encounter (HOSPITAL_COMMUNITY)
Admission: EM | Disposition: A | Discharge status: Home / Self Care | Payer: Self-pay | Source: Home / Self Care | Attending: Internal Medicine

## 2019-04-29 ENCOUNTER — Telehealth: Payer: Self-pay

## 2019-04-29 ENCOUNTER — Encounter (HOSPITAL_COMMUNITY): Payer: Self-pay | Admitting: Internal Medicine

## 2019-04-29 DIAGNOSIS — K5520 Angiodysplasia of colon without hemorrhage: Secondary | ICD-10-CM

## 2019-04-29 DIAGNOSIS — I739 Peripheral vascular disease, unspecified: Secondary | ICD-10-CM

## 2019-04-29 DIAGNOSIS — K625 Hemorrhage of anus and rectum: Secondary | ICD-10-CM

## 2019-04-29 DIAGNOSIS — K5791 Diverticulosis of intestine, part unspecified, without perforation or abscess with bleeding: Secondary | ICD-10-CM

## 2019-04-29 HISTORY — PX: COLONOSCOPY WITH PROPOFOL: SHX5780

## 2019-04-29 HISTORY — PX: ESOPHAGOGASTRODUODENOSCOPY (EGD) WITH PROPOFOL: SHX5813

## 2019-04-29 LAB — BPAM RBC
Blood Product Expiration Date: 202105132359
ISSUE DATE / TIME: 202104212023
Unit Type and Rh: 6200

## 2019-04-29 LAB — BASIC METABOLIC PANEL
Anion gap: 11 (ref 5–15)
BUN: 46 mg/dL — ABNORMAL HIGH (ref 8–23)
CO2: 21 mmol/L — ABNORMAL LOW (ref 22–32)
Calcium: 8.5 mg/dL — ABNORMAL LOW (ref 8.9–10.3)
Chloride: 107 mmol/L (ref 98–111)
Creatinine, Ser: 1.2 mg/dL (ref 0.61–1.24)
GFR calc Af Amer: >60 mL/min (ref >60)
GFR calc non Af Amer: >60 mL/min (ref >60)
Glucose, Bld: 116 mg/dL — ABNORMAL HIGH (ref 70–99)
Potassium: 4 mmol/L (ref 3.5–5.1)
Sodium: 139 mmol/L (ref 135–145)

## 2019-04-29 LAB — CBC
HCT: 25.3 % — ABNORMAL LOW (ref 39.0–52.0)
Hemoglobin: 8.2 g/dL — ABNORMAL LOW (ref 13.0–17.0)
MCH: 26.2 pg (ref 26.0–34.0)
MCHC: 32.4 g/dL (ref 30.0–36.0)
MCV: 80.8 fL (ref 80.0–100.0)
Platelets: 312 10*3/uL (ref 150–400)
RBC: 3.13 MIL/uL — ABNORMAL LOW (ref 4.22–5.81)
RDW: 18.8 % — ABNORMAL HIGH (ref 11.5–15.5)
WBC: 10.5 10*3/uL (ref 4.0–10.5)
nRBC: 0.4 % — ABNORMAL HIGH (ref 0.0–0.2)

## 2019-04-29 LAB — TYPE AND SCREEN
ABO/RH(D): AB POS
Antibody Screen: NEGATIVE
Unit division: 0

## 2019-04-29 LAB — GLUCOSE, CAPILLARY
Glucose-Capillary: 110 mg/dL — ABNORMAL HIGH (ref 70–99)
Glucose-Capillary: 119 mg/dL — ABNORMAL HIGH (ref 70–99)

## 2019-04-29 SURGERY — COLONOSCOPY WITH PROPOFOL
Anesthesia: Monitor Anesthesia Care

## 2019-04-29 SURGERY — ESOPHAGOGASTRODUODENOSCOPY (EGD) WITH PROPOFOL
Anesthesia: Monitor Anesthesia Care

## 2019-04-29 MED ORDER — PROPOFOL 10 MG/ML IV BOLUS
INTRAVENOUS | Status: DC | PRN
  Administered 2019-04-29 (×3): 20 mg via INTRAVENOUS

## 2019-04-29 MED ORDER — ASPIRIN EC 81 MG PO TBEC: 81.0000 mg | DELAYED_RELEASE_TABLET | Freq: Every day | ORAL | 0 refills | Status: AC

## 2019-04-29 MED ORDER — LACTATED RINGERS IV SOLN: INTRAVENOUS | Status: DC | PRN

## 2019-04-29 MED ORDER — SODIUM CHLORIDE 0.9 % IV SOLN: INTRAVENOUS | Status: DC | PRN

## 2019-04-29 MED ORDER — PROPOFOL 500 MG/50ML IV EMUL
INTRAVENOUS | Status: DC | PRN
  Administered 2019-04-29: 75 ug/kg/min via INTRAVENOUS

## 2019-04-29 SURGICAL SUPPLY — 25 items

## 2019-04-29 NOTE — Transfer of Care (Signed)
Immediate Anesthesia Transfer of Care Note  Patient: [name]  Procedure(s) Performed: COLONOSCOPY WITH PROPOFOL (N/A ) ESOPHAGOGASTRODUODENOSCOPY (EGD) WITH PROPOFOL (N/A )  Patient Location: Endoscopy Unit  Anesthesia Type:MAC  Level of Consciousness: awake, alert  and oriented  Airway & Oxygen Therapy: Patient Spontanous Breathing and Patient connected to nasal cannula oxygen  Post-op Assessment: Report given to RN, Post -op Vital signs reviewed and stable and Patient moving all extremities X 4  Post vital signs: Reviewed and stable  Last Vitals:  Vitals Value Taken Time  BP    Temp    Pulse    Resp    SpO2      Last Pain:  Vitals:   04/29/19 0942  TempSrc:   PainSc: 0-No pain         Complications: No apparent anesthesia complications

## 2019-04-29 NOTE — Anesthesia Preprocedure Evaluation (Signed)
Anesthesia Evaluation  Patient identified by MRN, date of birth, ID band Patient awake    Reviewed: Allergy & Precautions, NPO status , Patient's Chart, lab work & pertinent test results  History of Anesthesia Complications Negative for: history of anesthetic complications  Airway Mallampati: III  TM Distance: >3 FB Neck ROM: Full    Dental  (+) Teeth Intact   Pulmonary sleep apnea , former smoker,    Pulmonary exam normal        Cardiovascular hypertension, + CAD, + CABG (2007) and + Peripheral Vascular Disease  Normal cardiovascular exam     Neuro/Psych  Neuromuscular disease negative psych ROS   GI/Hepatic negative GI ROS, Neg liver ROS,   Endo/Other  diabetes, Type 2Morbid obesity  Renal/GU Renal InsufficiencyRenal disease  negative genitourinary   Musculoskeletal  (+) Arthritis ,   Abdominal   Peds  Hematology negative hematology ROS (+)   Anesthesia Other Findings   Reproductive/Obstetrics                             Anesthesia Physical  Anesthesia Plan  ASA: III  Anesthesia Plan: MAC   Post-op Pain Management:    Induction: Intravenous  PONV Risk Score and Plan: 2 and Ondansetron, Dexamethasone, Treatment may vary due to age or medical condition and Midazolam  Airway Management Planned: Nasal Cannula, Mask and Simple Face Mask  Additional Equipment: None  Intra-op Plan:   Post-operative Plan:   Informed Consent: I have reviewed the patients History and Physical, chart, labs and discussed the procedure including the risks, benefits and alternatives for the proposed anesthesia with the patient or authorized representative who has indicated his/her understanding and acceptance.     Dental advisory given  Plan Discussed with: Anesthesiologist, CRNA and Surgeon  Anesthesia Plan Comments:         Anesthesia Quick Evaluation

## 2019-04-29 NOTE — Anesthesia Procedure Notes (Signed)
Procedure Name: MAC Date/Time: 04/29/2019 10:36 AM Performed by: Neldon Newport, CRNA Pre-anesthesia Checklist: Patient identified, Emergency Drugs available, Suction available, Patient being monitored and Timeout performed Oxygen Delivery Method: Nasal cannula

## 2019-04-29 NOTE — Discharge Summary (Addendum)
Physician Discharge Kemper [id number] DOB: [birth year] DOA: 04/27/2019  PCP: [name], MD  Admit date: 04/27/2019 Discharge date: 04/29/2019  Admitted From: Home  Disposition:  Home   Recommendations for Outpatient Follow-up and new medication changes:  1. Follow up with Dr. [name] in 7 days.  2. Change aspirin from 325 mg down to 81 mg to start on April 26.  3. Discontinue potassium supplements, to prevent hyperkalemia.   Home Health: no   Equipment/Devices: no    Discharge Condition: stable  CODE STATUS: full  Diet recommendation: heart healthy and diabetic prudent.   Brief/Interim Summary: Patient was admitted to the hospital with a working diagnosis of acute blood loss anemia due to lower gastrointestinal bleed  69 year old male who presented with gastrointestinal bleeding.  He does have significant past medical history for obstructive sleep apnea, peripheral vascular disease status post bilateral above-the-knee amputations, dyslipidemia, and coronary artery disease.  On the day of admission, he noticed large amounts of dark blood with clots in the commode after moving his bowels.  Patient continued to have bloody bowel movements for about 4 more times, associated with nausea and diffuse abdominal pain.  On his initial physical examination his blood pressure was 106/71, heart rate 127, respiratory rate 27, oxygen saturation 97%, his lungs are clear to auscultation bilaterally, heart S1-S2 present and rhythmic, soft abdomen, tender to palpation in the epigastric region, status post bilateral AKA.  Sodium 141, potassium 6.6, chloride 105, bicarb 23, glucose 271, BUN 37, creatinine 1.1, white count 18.5, hemoglobin 17.4, hematocrit 55.2, platelets 528.  SARS COVID-19 negative.  Urine analysis negative for infection.  CT of the abdomen with focal splenic infarction in the upper pole of the spleen (not clinically significant), small left lobe pulmonary nodule.  EKG  138 bpm, normal axis, normal intervals, no ST segment or T wave changes.  1.  Acute blood loss anemia due to lower GI bleed/diverticular bleed.  Patient was admitted to the progressive care unit, he was placed on intravenous fluids, received intravenous pantoprazole, and frequent H&H monitoring.  Aspirin was held and gastroenterology was consulted.  Patient underwent further work-up with upper endoscopy and colonoscopy.  No signs of upper GI bleed, positive pancolonic diverticulosis, mainly in the sigmoid colon.  His diet was advanced with good toleration.  Patient will resume low-dose aspirin next week.  His discharge hemoglobin is 8.2, hematocrit 25.3  2.  Stage IIIa chronic kidney disease with hyperkalemia.  Potassium was discontinued and patient received intravenous fluids, calcium gluconate, IV insulin and bicarbonate.  Sodium zirconium by mouth.  Potassium at discharge 4.0, serum creatinine 1.2, bicarb 21, sodium 139 and chloride 107.   3.  Uncontrolled type 2 diabetes mellitus.  Hemoglobin A1c 8.6.  Patient received insulin sliding scale during hospitalization.  At home will resume Metformin and insulin regimen.  4.  Hypertension.  His antihypertensive agents were held during hospitalization, at discharge will resume carvedilol, losartan and hydrochlorothiazide.  5.  Peripheral vascular disease status post bilateral above-knee amputation.  Continue blood pressure and glucose control.  Aspirin dose has been reduced to 81 mg daily.  6.  Obesity class II/obstructive sleep apnea.  BMI 33.5.  Continue home CPAP.  Discharge Diagnoses:  Principal Problem:   Acute upper GI bleed Active Problems:   Obstructive sleep apnea   Type 2 diabetes mellitus with diabetic polyneuropathy, with long-term current use of insulin (HCC)   Chronic renal disease, stage III   Essential hypertension  PVD (peripheral vascular disease) (HCC)   Hyperkalemia   Obesity (BMI 30.0-34.9)   GIB (gastrointestinal  bleeding)    Discharge Instructions  Discharge Instructions    Diet - low sodium heart healthy   Complete by: As directed    Discharge instructions   Complete by: As directed    Please follow with primary care in 7 days. Please resume aspirin on April 26.   Increase activity slowly   Complete by: As directed      Allergies as of 04/29/2019      Reactions   Atorvastatin Other (See Comments)   myalgias   Ezetimibe Other (See Comments)   Body ache   Simvastatin Other (See Comments)   Body ache      Medication List    STOP taking these medications   aspirin 325 MG tablet Replaced by: aspirin EC 81 MG tablet   potassium citrate 10 MEQ (1080 MG) SR tablet Commonly known as: UROCIT-K     TAKE these medications   Accu-Chek Guide test strip Generic drug: glucose blood USE AS INSTRUCTED TO CHECK SUGAR 3 TIMES DAILY DX- E11.42   aspirin EC 81 MG tablet Take 1 tablet (81 mg total) by mouth daily. Please start taking on 05/03/19. Replaces: aspirin 325 MG tablet   carvedilol 25 MG tablet Commonly known as: COREG TAKE 1 TABLET TWICE DAILY What changed: when to take this   Droplet Pen Needles 31G X 5 MM Misc Generic drug: Insulin Pen Needle USE TO INJECT INSULIN FOUR TIMES DAILY AS INSTRUCTED (FOR DIABETES)   losartan-hydrochlorothiazide 50-12.5 MG tablet Commonly known as: HYZAAR TAKE 1 TABLET EVERY DAY   metFORMIN 1000 MG tablet Commonly known as: GLUCOPHAGE TAKE 1 TABLET EVERY DAY What changed: how much to take   multivitamin with minerals Tabs tablet Take 1 tablet by mouth daily. Centrum Silver for Men 50+   mupirocin ointment 2 % Commonly known as: BACTROBAN APPLY 1 APPLICATION TOPICALLY DAILY. What changed: See the new instructions.   NovoLOG FlexPen 100 UNIT/ML FlexPen Generic drug: insulin aspart INJECT 16-26 UNITS SUBCUTANEOUSLY THREE TIMES DAILY WITH MEALS What changed:   how much to take  how to take this  when to take this  additional  instructions   PROBIOTIC PO Take 1 capsule by mouth daily. Probulin Probiotic Supplement   QC TUMERIC COMPLEX PO Take by mouth daily.   Toujeo Max SoloStar 300 UNIT/ML Solostar Pen Generic drug: insulin glargine (2 Unit Dial) Inject 55 Units into the skin at bedtime. What changed: how much to take   vitamin C 1000 MG tablet Take 1,000 mg by mouth daily.   Vitamin D3 125 MCG (5000 UT) Tabs Take 5,000 Units by mouth daily.   zinc gluconate 50 MG tablet Take 50 mg by mouth 2 (two) times daily.      Follow-up Information    Viviana Simpler I, MD Follow up in 1 week(s).   Specialties: Internal Medicine, Pediatrics Contact information: [place] [address] [phone]          Allergies  Allergen Reactions  . Atorvastatin Other (See Comments)    myalgias  . Ezetimibe Other (See Comments)    Body ache  . Simvastatin Other (See Comments)    Body ache    Consultations:  GI    Procedures/Studies: CT Abdomen Pelvis W Contrast  Result Date: 04/27/2019 CLINICAL DATA:  Rectal bleeding EXAM: CT ABDOMEN AND PELVIS WITH CONTRAST TECHNIQUE: Multidetector CT imaging of the abdomen  and pelvis was performed using the standard protocol following bolus administration of intravenous contrast. CONTRAST:  171mL OMNIPAQUE IOHEXOL 300 MG/ML  SOLN COMPARISON:  None. FINDINGS: Lower chest: Tiny 2 mm nodule in the LEFT lower lobe (image 6/6). Hepatobiliary: No focal hepatic lesion. Multiple gallstones within the collapsed gallbladder. No biliary duct dilatation. Pancreas: Pancreas is normal. No ductal dilatation. No pancreatic inflammation. Spleen: Geographic low-attenuation region within the anterior pole of the spleen the suggest splenic infarction. Adrenals/urinary tract: Small round fat containing lesion of the LEFT adrenal gland measuring 17 mm on image 30/4). The ureters and bladder normal. Stomach/Bowel: Stomach, duodenum proximal small bowel normal. Distal  small bowel normal. Terminal ileum normal. Appendix not identified. Ascending transverse colon normal. Scattered diverticular the descending colon without acute inflammation. Vascular/Lymphatic: Abdominal aorta is normal caliber with atherosclerotic calcification. There is no retroperitoneal or periportal lymphadenopathy. No pelvic lymphadenopathy. The IA, SMA and celiac trunk are patent Reproductive: Brachytherapy seeds in the prostate gland Other: No free fluid. Musculoskeletal: No aggressive osseous lesion. IMPRESSION: 1. No explanation for rectal bleeding. Scattered diverticulosis without evidence of diverticulitis. 2. Aortic Atherosclerosis (ICD10-I70.0).  Branch vessels are patent. 3. 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 Benign myelolipoma of the LEFT adrenal gland. 5. Very small LEFT lobe pulmonary nodule is favored benign. No follow-up needed if patient is low-risk. Non-contrast chest CT can be considered in 12 months if patient is high-risk. This recommendation follows the consensus statement: Guidelines for Management of Incidental Pulmonary Nodules Detected on CT Images: From the Fleischner Society 2017; Radiology 2017; 284:228-243. Electronically Signed   By: Suzy Bouchard M.D.   On: 04/27/2019 14:22   DG Femur Portable Min 2 Views Right  Result Date: 04/05/2019 CLINICAL DATA:  Amputation with redness EXAM: RIGHT FEMUR PORTABLE 2 VIEW COMPARISON:  None. FINDINGS: Above the knee amputation with periosteal reaction that has a mature appearance, but there is also some indistinctness and blurring of the trabeculae. No soft tissue gas or opaque foreign body. Atherosclerosis. IMPRESSION: 1. No soft tissue emphysema or opaque foreign body. 2. Irregularity of the osteotomy could be from subacute surgery or bone infection. Electronically Signed   By: Monte Fantasia M.D.   On: 04/05/2019 04:15      Procedures: Upper endoscopy and colonoscopy   Subjective: Patient is feeling well,  no further bleeding, no nausea or vomiting, no chest pain or dyspnea.   Discharge Exam: Vitals:   04/29/19 1120 04/29/19 1135  BP: (!) 131/56 (!) 121/58  Pulse: (!) 108   Resp: 13   Temp:  98.9 F (37.2 C)  SpO2: 100% 95%   Vitals:   04/29/19 0942 04/29/19 1110 04/29/19 1120 04/29/19 1135  BP: (!) 131/51 (!) 120/45 (!) 131/56 (!) 121/58  Pulse: (!) 112  (!) 108   Resp: 19 17 13    Temp:  98.1 F (36.7 C)  98.9 F (37.2 C)  TempSrc:  Temporal  Oral  SpO2: 98%  100% 95%  Weight: 119.2 kg     Height: 6\' 2"  (1.88 m)       General: Not in pain or dyspnea.  Neurology: Awake and alert, non focal  E ENT: no pallor, no icterus, oral mucosa moist Cardiovascular: No JVD. S1-S2 present, rhythmic, no gallops, rubs, or murmurs. No lower extremity edema. Pulmonary: positive breath sounds bilaterally, adequate air movement, no wheezing, rhonchi or rales. Gastrointestinal. Abdomen protuberant with no organomegaly, non tender, no rebound or guarding Skin. No rashes Musculoskeletal: no joint deformities  The results of significant diagnostics from this hospitalization (including imaging, microbiology, ancillary and laboratory) are listed below for reference.     Microbiology: Recent Results (from the past 240 hour(s))  SARS CORONAVIRUS 2 (TAT 6-24 HRS) Nasopharyngeal Nasopharyngeal Swab     Status: None   Collection Time: 04/27/19  7:19 PM   Specimen: Nasopharyngeal Swab  Result Value Ref Range Status   SARS Coronavirus 2 NEGATIVE NEGATIVE Final    Comment: (NOTE) SARS-CoV-2 target nucleic acids are NOT DETECTED. The SARS-CoV-2 RNA is generally detectable in upper and lower respiratory specimens during the acute phase of infection. Negative results do not preclude SARS-CoV-2 infection, do not rule out co-infections with other pathogens, and should not be used as the sole basis for treatment or other patient management decisions. Negative results must be combined with clinical  observations, patient history, and epidemiological information. The expected result is Negative. Fact Sheet for Patients: SugarRoll.be Fact Sheet for Healthcare Providers: https://www.woods-mathews.com/ This test is not yet approved or cleared by the [place] FDA and  has been authorized for detection and/or diagnosis of SARS-CoV-2 by FDA under an Emergency Use Authorization (EUA). This EUA will remain  in effect (meaning this test can be used) for the duration of the COVID-19 declaration under Section 56 4(b)(1) of the Act, 21 U.S.C. section 360bbb-3(b)(1), unless the authorization is terminated or revoked sooner. Performed at [facility], [place] [address], [place], [place] [address]      Labs: BNP (last 3 results) No results for input(s): BNP in the last 8760 hours. Basic Metabolic Panel: Recent Labs  Lab 04/27/19 1130 04/27/19 1130 04/27/19 1314 04/27/19 1835 04/28/19 0200 04/28/19 0302 04/28/19 0643 04/28/19 1017 04/29/19 0539  NA 141  --  141  --   --  134*  --   --  139  K 6.6*   < > 7.1*   < > 5.8* 5.9* 5.1 5.1 4.0  CL 105  --  104  --   --  104  --   --  107  CO2 23  --  25  --   --  22  --   --  21*  GLUCOSE 271*  --  307*  --   --  283*  --   --  116*  BUN 37*  --  42*  --   --  72*  --   --  46*  CREATININE 1.10  --  1.11  --   --  1.62*  --   --  1.20  CALCIUM 9.9  --  9.5  --   --  8.9  --   --  8.5*   < > = values in this interval not displayed.   Liver Function Tests: Recent Labs  Lab 04/27/19 1130  AST 21  ALT 22  ALKPHOS 46  BILITOT 0.9  PROT 7.2  ALBUMIN 4.0   No results for input(s): LIPASE, AMYLASE in the last 168 hours. No results for input(s): AMMONIA in the last 168 hours. CBC: Recent Labs  Lab 04/27/19 1933 04/28/19 0506 04/28/19 0643 04/28/19 1651 04/29/19 0539  WBC 27.4* 22.8* 20.9* 13.9* 10.5  HGB 12.8* 9.6* 9.3* 7.7* 8.2*  HCT 41.9 30.2* 28.6* 23.5* 25.3*  MCV 80.9 78.0*  77.7* 76.8* 80.8  PLT 598* 465* 441* 351 312   Cardiac Enzymes: No results for input(s): CKTOTAL, CKMB, CKMBINDEX, TROPONINI in the last 168 hours. BNP: Invalid input(s): POCBNP CBG: Recent Labs  Lab 04/28/19 1120  04/28/19 1650 04/28/19 2219 04/29/19 0743 04/29/19 1133  GLUCAP 191* 206* 121* 110* 119*   D-Dimer No results for input(s): DDIMER in the last 72 hours. Hgb A1c No results for input(s): HGBA1C in the last 72 hours. Lipid Profile No results for input(s): CHOL, HDL, LDLCALC, TRIG, CHOLHDL, LDLDIRECT in the last 72 hours. Thyroid function studies No results for input(s): TSH, T4TOTAL, T3FREE, THYROIDAB in the last 72 hours.  Invalid input(s): FREET3 Anemia work up No results for input(s): VITAMINB12, FOLATE, FERRITIN, TIBC, IRON, RETICCTPCT in the last 72 hours. Urinalysis    Component Value Date/Time   COLORURINE STRAW (A) 04/28/2019 0155   APPEARANCEUR CLEAR 04/28/2019 0155   LABSPEC 1.015 04/28/2019 0155   PHURINE 5.0 04/28/2019 0155   GLUCOSEU 50 (A) 04/28/2019 0155   HGBUR NEGATIVE 04/28/2019 0155   BILIRUBINUR NEGATIVE 04/28/2019 0155   BILIRUBINUR neg 04/21/2012 0904   KETONESUR NEGATIVE 04/28/2019 0155   PROTEINUR NEGATIVE 04/28/2019 0155   UROBILINOGEN negative 04/21/2012 0904   NITRITE NEGATIVE 04/28/2019 0155   LEUKOCYTESUR NEGATIVE 04/28/2019 0155   Sepsis Labs Invalid input(s): PROCALCITONIN,  WBC,  LACTICIDVEN Microbiology Recent Results (from the past 240 hour(s))  SARS CORONAVIRUS 2 (TAT 6-24 HRS) Nasopharyngeal Nasopharyngeal Swab     Status: None   Collection Time: 04/27/19  7:19 PM   Specimen: Nasopharyngeal Swab  Result Value Ref Range Status   SARS Coronavirus 2 NEGATIVE NEGATIVE Final    Comment: (NOTE) SARS-CoV-2 target nucleic acids are NOT DETECTED. The SARS-CoV-2 RNA is generally detectable in upper and lower respiratory specimens during the acute phase of infection. Negative results do not preclude SARS-CoV-2 infection, do  not rule out co-infections with other pathogens, and should not be used as the sole basis for treatment or other patient management decisions. Negative results must be combined with clinical observations, patient history, and epidemiological information. The expected result is Negative. Fact Sheet for Patients: SugarRoll.be Fact Sheet for Healthcare Providers: https://www.woods-mathews.com/ This test is not yet approved or cleared by the [place] FDA and  has been authorized for detection and/or diagnosis of SARS-CoV-2 by FDA under an Emergency Use Authorization (EUA). This EUA will remain  in effect (meaning this test can be used) for the duration of the COVID-19 declaration under Section 56 4(b)(1) of the Act, 21 U.S.C. section 360bbb-3(b)(1), unless the authorization is terminated or revoked sooner. Performed at [facility], [place] [address] [place] [address], [place], [place] [address]      Time coordinating discharge: 45 minutes  SIGNED:   Tawni Millers, MD  [facility] 04/29/2019, 11:47 AM

## 2019-04-29 NOTE — Telephone Encounter (Signed)
1st attempt- Left HIPAA complaint voicemail to return my call, need to complete TCM and schedule hospital follow up.

## 2019-04-29 NOTE — Op Note (Signed)
[facility] Patient Name: [name] Procedure Date : 04/29/2019 MRN: [id number] Attending MD: Jackquline Denmark , MD Date of Birth: 09/21/1950 CSN: [id number] Age: 69 Admit Type: Inpatient Procedure:                Colonoscopy Indications:              Rectal bleeding Providers:                Jackquline Denmark, MD, Benetta Spar RN, RN, Theodora Blow, Technician Referring MD:              Medicines:                Monitored Anesthesia Care Complications:            No immediate complications. Estimated Blood Loss:     Estimated blood loss: none. Procedure:                Pre-Anesthesia Assessment:                           - Prior to the procedure, a History and Physical                            was performed, and patient medications and                            allergies were reviewed. The patient's tolerance of                            previous anesthesia was also reviewed. The risks                            and benefits of the procedure and the sedation                            options and risks were discussed with the patient.                            All questions were answered, and informed consent                            was obtained. Prior Anticoagulants: The patient has                            taken no previous anticoagulant or antiplatelet                            agents. ASA Grade Assessment: IV - A patient with                            severe systemic disease that is a constant threat  to life. After reviewing the risks and benefits,                            the patient was deemed in satisfactory condition to                            undergo the procedure.                           - Prior to the procedure, a History and Physical                            was performed, and patient medications and                            allergies were reviewed. The patient's tolerance of                 previous anesthesia was also reviewed. The risks                            and benefits of the procedure and the sedation                            options and risks were discussed with the patient.                            All questions were answered, and informed consent                            was obtained. Prior Anticoagulants: The patient has                            taken no previous anticoagulant or antiplatelet                            agents. ASA Grade Assessment: IV - A patient with                            severe systemic disease that is a constant threat                            to life. After reviewing the risks and benefits,                            the patient was deemed in satisfactory condition to                            undergo the procedure.                           After obtaining informed consent, the colonoscope                            was  passed under direct vision. Throughout the                            procedure, the patient's blood pressure, pulse, and                            oxygen saturations were monitored continuously. The                            [id number]) Olympus colonoscope was                            introduced through the anus and advanced to the 1                            cm into the ileum. The colonoscopy was performed                            without difficulty. The patient tolerated the                            procedure well. The quality of the bowel                            preparation was adequate to identify polyps. Scope In: 10:45:06 AM Scope Out: 10:59:51 AM Scope Withdrawal Time: 0 hours 9 minutes 23 seconds  Total Procedure Duration: 0 hours 14 minutes 45 seconds  Findings:      Multiple medium-mouthed diverticula were found in the sigmoid colon,       descending colon, transverse colon and ascending colon.      A few small localized angiodysplastic lesions without bleeding  were       found in the anterior rectum. No bleeding. These were very mild changes.      Non-bleeding internal hemorrhoids were found during retroflexion. The       hemorrhoids were moderate.      The terminal ileum appeared normal.      The exam was otherwise without abnormality on direct and retroflexion       views. Impression:               -Pancolonic diverticulosis predominantly in the                            sigmoid colon.                           -Very mild XRT proctitis. No bleeding.                           -Non-bleeding internal hemorrhoids.                           -Otherwise normal colonoscopy to TI. No active                            bleeding. He likely had diverticular bleed. Recommendation:           -  Return patient to hospital ward for ongoing care.                           - Resume previous diet.                           - Continue present medications.                           - Return to GI clinic PRN.                           - We will sign off for now. Procedure Code(s):        --- Professional ---                           [phone], Colonoscopy, flexible; diagnostic, including                            collection of specimen(s) by brushing or washing,                            when performed (separate procedure) Diagnosis Code(s):        --- Professional ---                           K64.8, Other hemorrhoids                           K55.20, Angiodysplasia of colon without hemorrhage                           K62.5, Hemorrhage of anus and rectum                           K57.30, Diverticulosis of large intestine without                            perforation or abscess without bleeding CPT copyright 2019 American Medical Association. All rights reserved. The codes documented in this report are preliminary and upon coder review may  be revised to meet current compliance requirements. Jackquline Denmark, MD 04/29/2019 11:15:27 AM This report has been signed  electronically. Number of Addenda: 0

## 2019-04-29 NOTE — Interval H&P Note (Signed)
History and Physical Interval Note:  04/29/2019 9:58 AM  [name]  has presented today for surgery, with the diagnosis of Painless hematochezia, known diverticulosis.  Overdue for adenomatous polyp surveillance (found in 2009).  Blood loss anemia..  The various methods of treatment have been discussed with the patient and family. After consideration of risks, benefits and other options for treatment, the patient has consented to  Procedure(s): COLONOSCOPY WITH PROPOFOL (N/A) ESOPHAGOGASTRODUODENOSCOPY (EGD) WITH PROPOFOL (N/A) as a surgical intervention.  The patient's history has been reviewed, patient examined, no change in status, stable for surgery.  I have reviewed the patient's chart and labs.  Questions were answered to the patient's satisfaction.     Jackquline Denmark

## 2019-04-29 NOTE — Op Note (Signed)
[facility] Patient Name: [name] Procedure Date : 04/29/2019 MRN: [id number] Attending MD: Jackquline Denmark , MD Date of Birth: [birth year] CSN: [id number] Age: 69 Admit Type: Inpatient Procedure:                Upper GI endoscopy Indications:              Recent gastrointestinal bleeding Providers:                Jackquline Denmark, MD, Benetta Spar RN, RN, Theodora Blow, Technician Referring MD:              Medicines:                Monitored Anesthesia Care Complications:            No immediate complications. Estimated Blood Loss:     Estimated blood loss: none. Procedure:                Pre-Anesthesia Assessment:                           - Prior to the procedure, a History and Physical                            was performed, and patient medications and                            allergies were reviewed. The patient's tolerance of                            previous anesthesia was also reviewed. The risks                            and benefits of the procedure and the sedation                            options and risks were discussed with the patient.                            All questions were answered, and informed consent                            was obtained. Prior Anticoagulants: The patient has                            taken no previous anticoagulant or antiplatelet                            agents. ASA Grade Assessment: IV - A patient with                            severe systemic disease that is a constant threat  to life. After reviewing the risks and benefits,                            the patient was deemed in satisfactory condition to                            undergo the procedure.                           After obtaining informed consent, the endoscope was                            passed under direct vision. Throughout the                            procedure, the patient's blood  pressure, pulse, and                            oxygen saturations were monitored continuously. The                            [id number] Olympus gastroscope was                            introduced through the mouth, and advanced to the                            second part of duodenum. The upper GI endoscopy was                            accomplished without difficulty. The patient                            tolerated the procedure well. Scope In: Scope Out: Findings:      The examined esophagus was normal.      A 2 cm hiatal hernia was present.      The entire examined stomach was normal.      The examined duodenum was normal.      No bleeding. Patient did become combative. Hence, examination was       limited and performed as quickly as possible to be on the safer side. Impression:               - Small hiatal hernia.                           - No evidence of upper GI bleeding. Recommendation:           - Trend CBC                           - Resume previous diet.                           - Continue present medications.                           -  Proceed with colonoscopy. Procedure Code(s):        --- Professional ---                           [phone], Esophagogastroduodenoscopy, flexible,                            transoral; diagnostic, including collection of                            specimen(s) by brushing or washing, when performed                            (separate procedure) Diagnosis Code(s):        --- Professional ---                           K44.9, Diaphragmatic hernia without obstruction or                            gangrene                           K92.2, Gastrointestinal hemorrhage, unspecified CPT copyright 2019 American Medical Association. All rights reserved. The codes documented in this report are preliminary and upon coder review may  be revised to meet current compliance requirements. Jackquline Denmark, MD 04/29/2019 11:08:39 AM This report  has been signed electronically. Number of Addenda: 0

## 2019-04-30 NOTE — Telephone Encounter (Signed)
Noted  

## 2019-04-30 NOTE — Anesthesia Postprocedure Evaluation (Signed)
Anesthesia Post Note  Patient: [name]  Procedure(s) Performed: COLONOSCOPY WITH PROPOFOL (N/A ) ESOPHAGOGASTRODUODENOSCOPY (EGD) WITH PROPOFOL (N/A )     Patient location during evaluation: PACU Anesthesia Type: MAC Level of consciousness: awake and alert Pain management: pain level controlled Vital Signs Assessment: post-procedure vital signs reviewed and stable Respiratory status: spontaneous breathing, nonlabored ventilation, respiratory function stable and patient connected to nasal cannula oxygen Cardiovascular status: stable and blood pressure returned to baseline Postop Assessment: no apparent nausea or vomiting Anesthetic complications: no    Last Vitals:  Vitals:   04/29/19 1120 04/29/19 1135  BP: (!) 131/56 (!) 121/58  Pulse: (!) 108 (!) 112  Resp: 13 15  Temp:  37.2 C  SpO2: 100% 95%    Last Pain:  Vitals:   04/29/19 1135  TempSrc: Oral  PainSc: 0-No pain                 Detavious Rinn

## 2019-04-30 NOTE — Telephone Encounter (Signed)
2nd/final attempt- Left HIPAA complaint voicemail to return my call, need to complete TCM and schedule hospital follow up.

## 2019-05-01 ENCOUNTER — Other Ambulatory Visit: Payer: Self-pay | Admitting: Orthopedic Surgery

## 2019-05-06 ENCOUNTER — Encounter: Payer: Self-pay | Admitting: Orthopedic Surgery

## 2019-05-06 ENCOUNTER — Ambulatory Visit (INDEPENDENT_AMBULATORY_CARE_PROVIDER_SITE_OTHER): Payer: Medicare Other | Admitting: Orthopedic Surgery

## 2019-05-06 ENCOUNTER — Other Ambulatory Visit: Payer: Self-pay

## 2019-05-06 VITALS — Ht 74.0 in | Wt 262.0 lb

## 2019-05-06 DIAGNOSIS — I2510 Atherosclerotic heart disease of native coronary artery without angina pectoris: Secondary | ICD-10-CM | POA: Diagnosis not present

## 2019-05-06 DIAGNOSIS — Z89611 Acquired absence of right leg above knee: Secondary | ICD-10-CM | POA: Diagnosis not present

## 2019-05-06 NOTE — Progress Notes (Signed)
Office Visit Note   Patient: [name]           Date of Birth: 1950/11/17           MRN: [id number] Visit Date: 05/06/2019              Requested by: [name], MD [place],  [place] [id number] PCP: [name], MD  Chief Complaint  Patient presents with  . Right Leg - Follow-up    12/11/18 right AKA      HPI: Patient is a 69 year old gentleman who presents in follow-up status post right above-the-knee amputation he has had slow healing with a persistent ulcer midline he has been kept on the current trental until this has completely healed.  Patient states he was recently hospitalized due to a GI bleed from his aspirin 325 mg a day he states he is now on 81 mg a day but still gets bruising on his arms.  Assessment & Plan: Visit Diagnoses:  1. S/P AKA (above knee amputation), right ([place])     Plan: Patient will discontinue the Trental and we will follow-up as needed.  Follow-Up Instructions: Return if symptoms worsen or fail to improve.   Ortho Exam  Patient is alert, oriented, no adenopathy, well-dressed, normal affect, normal respiratory effort. Examination the right above-the-knee amputation is healed quite well there is no redness no cellulitis the area of granulation tissue has epithelialized.  There is no tenderness to palpation no clinical signs of deep infection.  There is good hair growth.  Imaging: No results found. No images are attached to the encounter.  Labs: Lab Results  Component Value Date   HGBA1C 8.6 (H) 12/11/2018   HGBA1C 6.4 (A) 12/12/2017   HGBA1C 7.3 (A) 08/05/2017   ESRSEDRATE 35 (H) 03/31/2013   CRP 5.3 (H) 03/31/2013   REPTSTATUS 12/25/2017 FINAL 12/20/2017   GRAMSTAIN  12/30/2012    FEW WBC PRESENT, PREDOMINANTLY PMN RARE SQUAMOUS EPITHELIAL CELLS PRESENT ABUNDANT GRAM NEGATIVE RODS MODERATE GRAM POSITIVE COCCI IN PAIRS IN CLUSTERS   CULT  12/20/2017    NO GROWTH 5 DAYS Performed at [facility], [place] [address] [place], [place] [address]    LABORGA KLEBSIELLA OXYTOCA 12/15/2012   LABORGA PSEUDOMONAS AERUGINOSA 12/15/2012     Lab Results  Component Value Date   ALBUMIN 4.0 04/27/2019   ALBUMIN 2.9 (L) 12/15/2018   ALBUMIN 2.6 (L) 12/27/2017    No results found for: MG No results found for: VD25OH  No results found for: PREALBUMIN CBC EXTENDED Latest Ref Rng & Units 04/29/2019 04/28/2019 04/28/2019  WBC 4.0 - 10.5 K/uL 10.5 13.9(H) 20.9(H)  RBC 4.22 - 5.81 MIL/uL 3.13(L) 3.06(L) 3.68(L)  HGB 13.0 - 17.0 g/dL 8.2(L) 7.7(L) 9.3(L)  HCT 39.0 - 52.0 % 25.3(L) 23.5(L) 28.6(L)  PLT 150 - 400 K/uL 312 351 441(H)  NEUTROABS 1.7 - 7.7 K/uL - - -  LYMPHSABS 0.7 - 4.0 K/uL - - -     Body mass index is 33.64 kg/m.  Orders:  No orders of the defined types were placed in this encounter.  No orders of the defined types were placed in this encounter.    Procedures: No procedures performed  Clinical Data: No additional findings.  ROS:  All other systems negative, except as noted in the HPI. Review of Systems  Objective: Vital Signs: Ht 6\' 2"  (1.88 m)   Wt 262 lb (118.8 kg)   BMI 33.64 kg/m  Specialty Comments:  No specialty comments available.  PMFS History: Patient Active Problem List   Diagnosis Date Noted  . GIB (gastrointestinal bleeding) 04/28/2019  . Acute upper GI bleed 04/27/2019  . PVD (peripheral vascular disease) ([place]) 04/27/2019  . Hyperkalemia 04/27/2019  . Obesity (BMI 30.0-34.9) 04/27/2019  . Above knee amputation of right lower extremity ([place]) 12/14/2018  . Type 2 diabetes mellitus with peripheral neuropathy (HCC)   . Left above-knee amputee ([place]) 12/26/2017  . History of complete ray amputation of fifth toe of right foot ([place])   . Essential hypertension   . Chronic renal disease, stage III 02/19/2017  . Type 2 diabetes mellitus with diabetic polyneuropathy, with long-term current use of insulin ([place]) 03/29/2015  . Advance  directive discussed with patient 02/07/2014  . Leucocytosis 04/01/2013  . Routine general medical examination at a health care facility 12/25/2010  . PERSONAL HX COLONIC POLYPS 11/22/2009  . Venous (peripheral) insufficiency 06/29/2008  . Osteoarthritis, multiple sites 04/03/2007  . ERECTILE DYSFUNCTION, ORGANIC 06/13/2006  . Unspecified glaucoma 06/11/2006  . Coronary atherosclerosis of native coronary artery 06/11/2006  . Obstructive sleep apnea 06/11/2006   Past Medical History:  Diagnosis Date  . Cellulitis and abscess of left lower extremity 12/20/2017  . Coronary atherosclerosis of unspecified type of vessel, native or graft   . Hemorrhage of rectum and anus   . History of kidney stones    passed stones  . Impotence of organic origin   . Internal hemorrhoids without mention of complication   . Malignant neoplasm of prostate ([place])   . Mononeuritis of unspecified site   . Osteoarthrosis, unspecified whether generalized or localized, unspecified site   . Other and unspecified hyperlipidemia   . Peripheral vascular disease (HCC)    B BKA  . Personal history of colonic polyps   . Personal history of gallstones   . Subacute osteomyelitis, right ankle and foot ([place])   . Type II or unspecified type diabetes mellitus with neurological manifestations, not stated as uncontrolled(250.60)   . Unspecified glaucoma(365.9)   . Unspecified sleep apnea    cpcp    Family History  Problem Relation Age of Onset  . Lung cancer Father   . Multiple sclerosis Mother   . Heart attack Other        paternal aunts and uncles  . Peripheral vascular disease Maternal Grandfather        several amputations    Past Surgical History:  Procedure Laterality Date  . AMPUTATION Left 12/30/2012   Procedure: AMPUTATION RAY ;  Surgeon: Meredith Pel, MD;  Location: [facility];  Service: Orthopedics;  Laterality: Left;  LEFT GREAT TOE RAY AMPUTATION  . AMPUTATION Left 02/23/2016   Procedure: Left Below  Knee Amputation;  Surgeon: [name], MD;  Location: [place];  Service: Orthopedics;  Laterality: Left;  . AMPUTATION Left 12/24/2017   Procedure: LEFT ABOVE KNEE AMPUTATION;  Surgeon: [name], MD;  Location: [place];  Service: Orthopedics;  Laterality: Left;  . AMPUTATION Right 12/24/2017   Procedure: RIGHT 5TH RAY AMPUTATION;  Surgeon: [name], MD;  Location: [place];  Service: Orthopedics;  Laterality: Right;  . AMPUTATION Right 12/11/2018   Procedure: RIGHT ABOVE KNEE AMPUTATION;  Surgeon: [name], MD;  Location: [place];  Service: Orthopedics;  Laterality: Right;  . APPENDECTOMY    . BASAL CELL CARCINOMA EXCISION  2/16   left forearm  . BELPHAROPTOSIS REPAIR    . [place]  Negative  . CATARACT EXTRACTION Right 2017   then lid surgery  . COLONOSCOPY    . COLONOSCOPY WITH PROPOFOL N/A 04/29/2019   Procedure: COLONOSCOPY WITH PROPOFOL;  Surgeon: Jackquline Denmark, MD;  Location[facility] END[facility]COPY;  Service: Endoscopy;  Laterality: N/A;  . CORONARY ARTERY BYPASS GRAFT  09/2005   5 bypasses per patient, Post op AFIB  . ESOPHAGOGASTRODUODENOSCOPY (EGD) WITH PROPOFOL N/A 04/29/2019   Procedure: ESOPHAGOGASTRODUODENOSCOPY (EGD) WITH PROPOFOL;  Surgeon: Jackquline Denmark, MD;  Location: [facility] ENDOSCOPY;  Service: Endoscopy;  Laterality: N/A;  . EYE SURGERY    . FOOT BONE EXCISION Left 11/03/2013   DR Lady Wisham   . I & D EXTREMITY Left 11/03/2013   Procedure: Left Foot Partial Bone Excision Cuboid and Medial Cuneiform, Wound Closures;  Surgeon: [name], MD;  Location: [facility];  Service: Orthopedics;  Laterality: Left;  . INSERTION PROSTATE RADIATION SEED  2009   RT and seeds for prostate cancer  . KIDNEY STONE SURGERY  04/1993  . RCA stents  04/2003   EF 55%  . RETINAL DETACHMENT SURGERY  2002-2003  . thrombosed vein  1993   Right leg   Social History   Occupational History  . Occupation: Heavy equipment mechanic--retired  Tobacco Use  . Smoking status: Former  Smoker    Packs/day: 2.00    Years: 35.00    Pack years: 70.00    Types: Cigars, Cigarettes    Quit date: 01/07/2001    Years since quitting: 18.3  . Smokeless tobacco: Never Used  Substance and Sexual Activity  . Alcohol use: Not Currently    Comment: rare  . Drug use: No  . Sexual activity: Yes    Partners: Female

## 2019-05-13 ENCOUNTER — Encounter: Payer: Self-pay | Admitting: *Deleted

## 2019-05-27 ENCOUNTER — Other Ambulatory Visit: Payer: Self-pay

## 2019-05-31 ENCOUNTER — Other Ambulatory Visit: Payer: Self-pay

## 2019-05-31 ENCOUNTER — Ambulatory Visit (INDEPENDENT_AMBULATORY_CARE_PROVIDER_SITE_OTHER): Payer: Medicare Other | Admitting: Internal Medicine

## 2019-05-31 ENCOUNTER — Encounter: Payer: Self-pay | Admitting: Internal Medicine

## 2019-05-31 VITALS — BP 120/76 | HR 83

## 2019-05-31 DIAGNOSIS — E1142 Type 2 diabetes mellitus with diabetic polyneuropathy: Secondary | ICD-10-CM | POA: Diagnosis not present

## 2019-05-31 DIAGNOSIS — E669 Obesity, unspecified: Secondary | ICD-10-CM

## 2019-05-31 DIAGNOSIS — E782 Mixed hyperlipidemia: Secondary | ICD-10-CM | POA: Diagnosis not present

## 2019-05-31 DIAGNOSIS — Z794 Long term (current) use of insulin: Secondary | ICD-10-CM | POA: Diagnosis not present

## 2019-05-31 DIAGNOSIS — I2510 Atherosclerotic heart disease of native coronary artery without angina pectoris: Secondary | ICD-10-CM | POA: Diagnosis not present

## 2019-05-31 LAB — POCT GLYCOSYLATED HEMOGLOBIN (HGB A1C): Hemoglobin A1C: 6.4 % — AB (ref 4.0–5.6)

## 2019-05-31 MED ORDER — METFORMIN HCL 1000 MG PO TABS: 2000.0000 mg | ORAL_TABLET | Freq: Every day | ORAL | 3 refills | Status: DC

## 2019-05-31 MED ORDER — NOVOLOG FLEXPEN 100 UNIT/ML ~~LOC~~ SOPN: 25.0000 [IU] | PEN_INJECTOR | Freq: Three times a day (TID) | SUBCUTANEOUS | 3 refills | Status: DC

## 2019-05-31 MED ORDER — TOUJEO MAX SOLOSTAR 300 UNIT/ML ~~LOC~~ SOPN: 60.0000 [IU] | PEN_INJECTOR | Freq: Every day | SUBCUTANEOUS | 3 refills | Status: DC

## 2019-05-31 NOTE — Progress Notes (Signed)
Patient ID: [name], male   DOB: 1950/12/09, 69 y.o.   MRN: [id number]  This visit occurred during the SARS-CoV-2 public health emergency.  Safety protocols were in place, including screening questions prior to the visit, additional usage of staff PPE, and extensive cleaning of exam room while observing appropriate contact time as indicated for disinfecting solutions.   HPI: [name] is a 69 y.o.-year-old male, returning for f/u for DM2, dx 2008, insulin-dependent since dx, uncontrolled, with complications (peripheral neuropathy, CAD-status post CABG in 2008, PVD, Charcot foot, history of diabetic foot ulcer, AKA bilaterally). Last visit 4 months ago. 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 Also Part D - [employer].   He was admitted with GIB 1 mo ago. Hb 7. He had blood transfusion approximately 1 month ago.   Reviewed HbA1c: Lab Results  Component Value Date   HGBA1C 8.6 (H) 12/11/2018   HGBA1C 6.4 (A) 12/12/2017   HGBA1C 7.3 (A) 08/05/2017   Pt is on a regimen of: - Metformin 2000 mg with dinner - Toujeo 60 >> 66 >> 56 units at bedtime - Humalog 25-30 >> Novolog  16-26 >> 25-30 units before meals  Pt checks his sugars 1-3 times a day - am: 150-190 >> 87, 130-150 >> 110-140 > 132-190 >> 120-160 - before lunch: 69, 104-154, 207 >> n/c >> 56 >> n/c - before dinner: 130-160 >> 120-150 >> 94-120s >> 120-160, 190 - bedtime:  96-160 >> n/c >> 110-130 >> n/c Lowest: 56 x1 (lunchtime) ... >> 80s >> 100 >> 94 >> 120. Hypogly awareness at 100.  Highest sugar: 200 >> 180 (Tx-giving) >> 190 >> 290 >> 215.  Patient meals are: - Breakfast: bacon, eggs, tomato sandwich (sometimes no bread) >> coffee + fruit - Lunch: PB crackers >> baked fish or chicken + veggies - Dinner: meat + 2 veggies  >> same as lunch - Snacks: 1 a day - 2x a week, icecream  >> fruit Uses Splenda in tea and coffee.  -+ CKD, last BUN/creatinine:  Lab Results  Component Value Date   BUN 46 (H)  04/29/2019   CREATININE 1.20 04/29/2019  On losartan. -+ HL; last set of lipids: Lab Results  Component Value Date   CHOL 107 02/19/2017   HDL 26.40 (L) 02/19/2017   LDLCALC 52 02/19/2017   LDLDIRECT 72.0 02/13/2015   TRIG 143.0 02/19/2017   CHOLHDL 4 02/19/2017  He is statin intolerant. - last eye exam was in 2020: No DR.  Dr. Claudean Kinds.  He had a detached retina in 03/2015 >> had Laser sx (his 3rd).  He had right cataract surgery 06/2015.  He had amputation of his big toe in 12/30/2012 2/2 infection >> osteomyelitis. He continued to have problems with ulcers on his left foot, in the setting of Charcot joint. He had a L BKA (02/23/2016) b/c he had a long h/o ulcer on his Charcot foot >> developed osteomyelitis.  Now prosthesis is off due to his ulcers.  He had L AKA 12/2017, R 5th ray amputation 12/2017.  Since last visit, he had right AKA 12/11/2018. Before this, he had infection and gangrene in his R leg.   He also has a history of prostate cancer , s/p 25 RxTx, then radioactive seeds.  He also has HTN, OSA, only.  ROS: Constitutional: no weight gain/no weight loss, no fatigue, no subjective hyperthermia, no subjective hypothermia Eyes: no blurry vision, no xerophthalmia ENT: no sore throat, no nodules palpated in  neck, no dysphagia, no odynophagia, no hoarseness Cardiovascular: no CP/no SOB/no palpitations/no leg swelling Respiratory: no cough/no SOB/no wheezing Gastrointestinal: no N/no V/no D/no C/no acid reflux Musculoskeletal: no muscle aches/no joint aches Skin: no rashes, no hair loss Neurological: no tremors/no numbness/no tingling/no dizziness  I reviewed pt's medications, allergies, PMH, social hx, family hx, and changes were documented in the history of present illness. Otherwise, unchanged from my initial visit note.   Past Medical History:  Diagnosis Date  . Basal cell carcinoma 02/23/2019   nodular/infiltrative- right forearm-mohs  . Cellulitis and abscess of  left lower extremity 12/20/2017  . Coronary atherosclerosis of unspecified type of vessel, native or graft   . Hemorrhage of rectum and anus   . History of kidney stones    passed stones  . Impotence of organic origin   . Internal hemorrhoids without mention of complication   . Malignant neoplasm of prostate ([place])   . Mononeuritis of unspecified site   . Osteoarthrosis, unspecified whether generalized or localized, unspecified site   . Other and unspecified hyperlipidemia   . Peripheral vascular disease (HCC)    B BKA  . Personal history of colonic polyps   . Personal history of gallstones   . Subacute osteomyelitis, right ankle and foot ([place])   . Type II or unspecified type diabetes mellitus with neurological manifestations, not stated as uncontrolled(250.60)   . Unspecified glaucoma(365.9)   . Unspecified sleep apnea    cpcp   Past Surgical History:  Procedure Laterality Date  . AMPUTATION Left 12/30/2012   Procedure: AMPUTATION RAY ;  Surgeon: Meredith Pel, MD;  Location: [facility];  Service: Orthopedics;  Laterality: Left;  LEFT GREAT TOE RAY AMPUTATION  . AMPUTATION Left 02/23/2016   Procedure: Left Below Knee Amputation;  Surgeon: [name], MD;  Location: [place];  Service: Orthopedics;  Laterality: Left;  . AMPUTATION Left 12/24/2017   Procedure: LEFT ABOVE KNEE AMPUTATION;  Surgeon: [name], MD;  Location: [place];  Service: Orthopedics;  Laterality: Left;  . AMPUTATION Right 12/24/2017   Procedure: RIGHT 5TH RAY AMPUTATION;  Surgeon: [name], MD;  Location: [place];  Service: Orthopedics;  Laterality: Right;  . AMPUTATION Right 12/11/2018   Procedure: RIGHT ABOVE KNEE AMPUTATION;  Surgeon: [name], MD;  Location: [place];  Service: Orthopedics;  Laterality: Right;  . APPENDECTOMY    . BASAL CELL CARCINOMA EXCISION  2/16   left forearm  . BELPHAROPTOSIS REPAIR    . CARDIAC CATHETERIZATION  1998   Negative  . CATARACT EXTRACTION Right 2017   then lid  surgery  . COLONOSCOPY    . COLONOSCOPY WITH PROPOFOL N/A 04/29/2019   Procedure: COLONOSCOPY WITH PROPOFOL;  Surgeon: Jackquline Denmark, MD;  Location: [facility];  Service: Endoscopy;  Laterality: N/A;  . CORONARY ARTERY BYPASS GRAFT  09/2005   5 bypasses per patient, Post op AFIB  . ESOPHAGOGASTRODUODENOSCOPY (EGD) WITH PROPOFOL N/A 04/29/2019   Procedure: ESOPHAGOGASTRODUODENOSCOPY (EGD) WITH PROPOFOL;  Surgeon: Jackquline Denmark, MD;  Location: [facility] ENDOSCOPY;  Service: Endoscopy;  Laterality: N/A;  . EYE SURGERY    . FOOT BONE EXCISION Left 11/03/2013   DR DUDA   . I & D EXTREMITY Left 11/03/2013   Procedure: Left Foot Partial Bone Excision Cuboid and Medial Cuneiform, Wound Closures;  Surgeon: [name], MD;  Location: [place];  Service: Orthopedics;  Laterality: Left;  . INSERTION PROSTATE RADIATION SEED  2009   RT and seeds for prostate  cancer  . KIDNEY STONE SURGERY  04/1993  . RCA stents  04/2003   EF 55%  . RETINAL DETACHMENT SURGERY  2002-2003  . thrombosed vein  1993   Right leg   Social History   Socioeconomic History  . Marital status: Widowed    Spouse name: Not on file  . Number of children: 3  . Years of education: Not on file  . Highest education level: Not on file  Occupational History  . Occupation: Heavy equipment mechanic--retired  Tobacco Use  . Smoking status: Former Smoker    Packs/day: 2.00    Years: 35.00    Pack years: 70.00    Types: Cigars, Cigarettes    Quit date: 01/07/2001    Years since quitting: 18.4  . Smokeless tobacco: Never Used  Substance and Sexual Activity  . Alcohol use: Not Currently    Comment: rare  . Drug use: No  . Sexual activity: Yes    Partners: Female  Other Topics Concern  . Not on file  Social History Narrative   Has living will   Daughter [name] is [facility] health care POA    Would accept resuscitation attempts--but no prolonged artificial ventilation   No tube feeds if cognitively unaware   Social Determinants of  Health   Financial Resource Strain:   . Difficulty of Paying Living Expenses:   Food Insecurity:   . Worried About Charity fundraiser in the Last Year:   . Arboriculturist in the Last Year:   Transportation Needs:   . Film/video editor (Medical):   Marland Kitchen Lack of Transportation (Non-Medical):   Physical Activity:   . Days of Exercise per Week:   . Minutes of Exercise per Session:   Stress:   . Feeling of Stress :   Social Connections:   . Frequency of Communication with Friends and Family:   . Frequency of Social Gatherings with Friends and Family:   . Attends Religious Services:   . Active Member of Clubs or Organizations:   . Attends Archivist Meetings:   Marland Kitchen Marital Status:   Intimate Partner Violence:   . Fear of Current or Ex-Partner:   . Emotionally Abused:   Marland Kitchen Physically Abused:   . Sexually Abused:    Current Outpatient Medications on File Prior to Visit  Medication Sig Dispense Refill  . ACCU-CHEK GUIDE test strip USE AS INSTRUCTED TO CHECK SUGAR 3 TIMES DAILY DX- E11.42 200 strip 8  . Ascorbic Acid (VITAMIN C) 1000 MG tablet Take 1,000 mg by mouth daily.    . carvedilol (COREG) 25 MG tablet TAKE 1 TABLET TWICE DAILY (Patient taking differently: Take 25 mg by mouth 2 (two) times daily with a meal. ) 180 tablet 3  . Cholecalciferol (VITAMIN D3) 125 MCG (5000 UT) TABS Take 5,000 Units by mouth daily.    . DROPLET PEN NEEDLES 31G X 5 MM MISC USE TO INJECT INSULIN FOUR TIMES DAILY AS INSTRUCTED (FOR DIABETES) 400 each 11  . insulin aspart (NOVOLOG FLEXPEN) 100 UNIT/ML FlexPen INJECT 16-26 UNITS SUBCUTANEOUSLY THREE TIMES DAILY WITH MEALS (Patient taking differently: Inject 25-30 Units into the skin 3 (three) times daily with meals. Sliding Scale Insulin) 20 pen 3  . Insulin Glargine, 2 Unit Dial, (TOUJEO MAX SOLOSTAR) 300 UNIT/ML SOPN Inject 55 Units into the skin at bedtime. (Patient taking differently: Inject 56 Units into the skin at bedtime. ) 9 pen 3  .  losartan-hydrochlorothiazide (HYZAAR) 50-12.5 MG  tablet TAKE 1 TABLET EVERY DAY 90 tablet 3  . metFORMIN (GLUCOPHAGE) 1000 MG tablet TAKE 1 TABLET EVERY DAY (Patient taking differently: Take 2,000 mg by mouth daily. ) 90 tablet 1  . Multiple Vitamin (MULTIVITAMIN WITH MINERALS) TABS tablet Take 1 tablet by mouth daily. Centrum Silver for Men 50+    . mupirocin ointment (BACTROBAN) 2 % APPLY 1 APPLICATION TOPICALLY DAILY. 22 g 1  . Probiotic Product (PROBIOTIC PO) Take 1 capsule by mouth daily. Probulin Probiotic Supplement    . Turmeric (QC TUMERIC COMPLEX PO) Take by mouth daily.    Marland Kitchen zinc gluconate 50 MG tablet Take 50 mg by mouth 2 (two) times daily.      No current facility-administered medications on file prior to visit.   Allergies  Allergen Reactions  . Atorvastatin Other (See Comments)    myalgias  . Ezetimibe Other (See Comments)    Body ache  . Simvastatin Other (See Comments)    Body ache   Family History  Problem Relation Age of Onset  . Lung cancer Father   . Multiple sclerosis Mother   . Heart attack Other        paternal aunts and uncles  . Peripheral vascular disease Maternal Grandfather        several amputations    PE: BP 120/76 (BP Location: Left Arm, Patient Position: Sitting, Cuff Size: Large)   Pulse 83   SpO2 98%  There is no height or weight on file to calculate BMI. Could not be weighed as he is in a wheelchair    Wt Readings from Last 3 Encounters:  05/06/19 262 lb (118.8 kg)  04/29/19 262 lb 12.6 oz (119.2 kg)  04/08/19 260 lb (117.9 kg)   Constitutional: overweight, in NAD, in wheelchair Eyes: PERRLA, EOMI, no exophthalmos ENT: moist mucous membranes, no thyromegaly, no cervical lymphadenopathy Cardiovascular: RRR, No MRG Respiratory: CTA B Gastrointestinal: abdomen soft, NT, ND, BS+ Musculoskeletal: + B AKA, strength intact in all 4 Skin: moist, warm, no rashes Neurological: no tremor with outstretched hands, DTR normal in all  4  ASSESSMENT: 1. DM2, insulin-dependent, uncontrolled, with complications - peripheral neuropathy - CAD-status post stent in 2005, then 5v CABG in 2008 - PVD - history of diabetic foot ulcer - Charcot foot - L - h/o amputation of his big toe in 12/2012 2/2 osteomyelitis and L BKA 01/2016; L AKA 12/2017, R 5th ray amputation 12/2017 - history of retinal detachment in 2002-3  2. Obesity class 2 BMI Classification:  < 18.5 underweight   18.5-24.9 normal weight   25.0-29.9 overweight   30.0-34.9 class I obesity   35.0-39.9 class II obesity   ? 40.0 class III obesity   3. HL  PLAN:  1. Patient with uncontrolled type 2 diabetes, on basal-bolus insulin regimen and Metformin.  His sugars became better control after he started eating a more plant-based diet and his HbA1c decreased to 6.1%.  However, he relaxed his diet afterwards and sugars increased.  They were especially higher in the few months before our last visit, in the setting of infection and necrosis before his amputation.  HbA1c was 8.6%, higher.  The sugars improved after the surgery but they were still quite high in the morning and at goal before dinner.  She was not checking sugars after dinner and we discussed about starting to do so.  We will move the Metformin dose at night. -At this visit, he tells me that he definitely saw an improvement whenever  he doubled up on his Metformin dose at night.  However, he occasionally forgets to take it along with his dinner and takes it later at night.  Whenever he does so, sugars are higher in the morning.  He will try to better remember it to take it at dinnertime.  In the meantime, since his sugars are still above target, will increase the Toujeo and lower than NovoLog dose. - I advised him to:  Patient Instructions  Please increase: - Toujeo 60 units at bedtime  Continues: - NovoLog 25-30 units before meals - Metformin 2000 mg with dinner  Please return in 4 months with your  sugar log.    - we checked his HbA1c: 6.4% (better, but possibly affected by his blood transfusion 1 mo ago) - advised to check sugars at different times of the day - 3-4x a day, rotating check times - advised for yearly eye exams >> he is UTD - return to clinic in 4 months  2. Obesity class 2 -She initially lost 20 pounds on a more plant-based diet but then gained some of it back -No weight loss since last visit  3. HL -Reviewed his latest lipid panel from 2019: LDL HDL low Lab Results  Component Value Date   CHOL 107 02/19/2017   HDL 26.40 (L) 02/19/2017   LDLCALC 52 02/19/2017   LDLDIRECT 72.0 02/13/2015   TRIG 143.0 02/19/2017   CHOLHDL 4 02/19/2017  -he is intolerant to statin  Recently started on a weekly statin by PCP >> still mm aches -he is due for another lipid panel -we will check today  Addendum: -Patient did not stop at the lab...  Philemon Kingdom, MD PhD [facility]

## 2019-05-31 NOTE — Patient Instructions (Signed)
Please increase: - Toujeo 60 units at bedtime  Continues: - NovoLog 25-30 units before meals - Metformin 2000 mg with dinner  Please return in 4 months with your sugar log.

## 2019-07-05 ENCOUNTER — Other Ambulatory Visit: Payer: Self-pay | Admitting: Orthopedic Surgery

## 2019-07-14 ENCOUNTER — Other Ambulatory Visit: Payer: Self-pay | Admitting: Internal Medicine

## 2019-08-11 ENCOUNTER — Other Ambulatory Visit: Payer: Self-pay | Admitting: Internal Medicine

## 2019-08-23 ENCOUNTER — Other Ambulatory Visit: Payer: Self-pay | Admitting: Orthopedic Surgery

## 2019-09-22 ENCOUNTER — Other Ambulatory Visit: Payer: Self-pay | Admitting: Internal Medicine

## 2019-10-07 ENCOUNTER — Ambulatory Visit: Payer: Medicare Other | Admitting: Internal Medicine

## 2019-10-11 ENCOUNTER — Other Ambulatory Visit: Payer: Self-pay | Admitting: Orthopedic Surgery

## 2019-10-18 ENCOUNTER — Other Ambulatory Visit: Payer: Self-pay | Admitting: Internal Medicine

## 2019-10-20 ENCOUNTER — Other Ambulatory Visit: Payer: Self-pay | Admitting: Internal Medicine

## 2019-11-23 ENCOUNTER — Other Ambulatory Visit: Payer: Self-pay

## 2019-11-23 ENCOUNTER — Ambulatory Visit: Payer: Medicare Other

## 2019-11-23 DIAGNOSIS — I10 Essential (primary) hypertension: Secondary | ICD-10-CM

## 2019-11-23 DIAGNOSIS — E1142 Type 2 diabetes mellitus with diabetic polyneuropathy: Secondary | ICD-10-CM

## 2019-11-23 NOTE — Chronic Care Management (AMB) (Signed)
Chronic Care Management Pharmacy  Name: KYIAN OBST  MRN: [id number] DOB: 1950/12/01  Chief Complaint/ HPI  [name],  69 y.o. , male presents for their Follow-Up CCM visit with the clinical pharmacist via telephone.  PCP : [name], MD  Their chronic conditions include: CAD, hypertension, sleep apnea, type 2 diabetes, neuropathy, osteoarthritis, chronic renal disease, glaucoma, history of prostate cancer   Patient concerns: Denies health concerns, medication changes, or OV in past 3 months. Goal is to stay proactive against COVID, inquired about booster. Potassium citrate and all otcs except multivitamin discontinued during hospital admission 04/27/2019.   Office Visits:  04/07/19: [name] - diabetes uncontrolled, going back to specialist  Consult Visit:  05/31/19: [name] - Increase Toujeo 60 units at bedtime, continue NovoLog 25-30 units before meals, metformin 2000 mg with dinner, RTC 4 months  04/27/19: ED to hospital admission rectal bleeding   02/04/19: [name] -  Osteoarthritis, no medication changes   01/28/19: Gherghe - Increase metformin to 2000 mg qPM  Allergies  Allergen Reactions  . Atorvastatin Other (See Comments)    myalgias  . Ezetimibe Other (See Comments)    Body ache  . Simvastatin Other (See Comments)    Body ache   Medications: Outpatient Encounter Medications as of 11/23/2019  Medication Sig  . ACCU-CHEK GUIDE test strip USE AS DIRECTED TO CHECK BLOOD SUGAR 3 TIMES DAILY  . carvedilol (COREG) 25 MG tablet TAKE 1 TABLET TWICE DAILY  . DROPLET PEN NEEDLES 31G X 5 MM MISC USE TO INJECT INSULIN FOUR TIMES DAILY AS INSTRUCTED (FOR DIABETES)  . insulin aspart (NOVOLOG FLEXPEN) 100 UNIT/ML FlexPen Inject 25-30 Units into the skin 3 (three) times daily with meals. Sliding Scale Insulin  . insulin glargine, 2 Unit Dial, (TOUJEO MAX SOLOSTAR) 300 UNIT/ML Solostar Pen Inject 60 Units into the skin at bedtime. (Patient taking differently: Inject 66  Units into the skin at bedtime. )  . losartan-hydrochlorothiazide (HYZAAR) 50-12.5 MG tablet TAKE 1 TABLET EVERY DAY  . metFORMIN (GLUCOPHAGE) 1000 MG tablet TAKE 1 TABLET EVERY DAY  . Multiple Vitamin (MULTIVITAMIN WITH MINERALS) TABS tablet Take 1 tablet by mouth daily. Centrum Silver for Men 50+  . mupirocin ointment (BACTROBAN) 2 % APPLY TO THE AFFECTED AREA TOPICALLY DAILY (Patient not taking: Reported on 11/23/2019)  . [DISCONTINUED] Ascorbic Acid (VITAMIN C) 1000 MG tablet Take 1,000 mg by mouth daily.  . [DISCONTINUED] Cholecalciferol (VITAMIN D3) 125 MCG (5000 UT) TABS Take 5,000 Units by mouth daily.  . [DISCONTINUED] Probiotic Product (PROBIOTIC PO) Take 1 capsule by mouth daily. Probulin Probiotic Supplement  . [DISCONTINUED] Turmeric (QC TUMERIC COMPLEX PO) Take by mouth daily.  . [DISCONTINUED] zinc gluconate 50 MG tablet Take 50 mg by mouth 2 (two) times daily.    No facility-administered encounter medications on file as of 11/23/2019.   Current Diagnosis/Assessment: Goals    . Pharmacy Care Plan     CARE PLAN ENTRY (see longitudinal plan of care for additional care plan information)  Current Barriers:  . Chronic Disease Management support, education, and care coordination needs related to Hypertension, Hyperlipidemia, and Diabetes   Hypertension BP Readings from Last 3 Encounters:  05/31/19 120/76  04/29/19 (!) 121/58  04/07/19 118/80   . Pharmacist Clinical Goal(s): o Over the next 6 months, patient will work with PharmD and providers to achieve BP goal <130/80 . Current regimen:   carvedilol (COREG) 25 MG tablet - 1 tablet BID  losartan-hydrochlorothiazide (HYZAAR) 50-12.5 MG tablet -  1 tablet daily . Interventions: o Reviewed labs and vitals . Patient self care activities - Over the next 6 months, patient will: o Ensure daily salt intake < 2300 mg/day  Hyperlipidemia/Coronary Artery Disease Lab Results  Component Value Date/Time   LDLCALC 52 02/19/2017  11:33 AM   LDLDIRECT 72.0 02/13/2015 11:22 AM   . Pharmacist Clinical Goal(s): o Over the next 6 months, patient will work with PharmD and providers to maintain LDL goal < 70 . Current regimen:  o Aspirin 81 mg - 1 tablet daily . Interventions: o Reviewed medication safety . Patient self care activities - Over the next 6 months, patient will: . Incorporate a healthy diet high in vegetables, fruits and whole grains with low-fat dairy products, chicken, fish, legumes, non-tropical vegetable oils and nuts. Limit intake of sweets, sugar-sweetened beverages and red meats. . Avoid aspirin containing products such as Excedrin and Goody's powder. Avoid NSAIDs including Advil (ibuprofen) and Aleve (naproxen).   Diabetes Lab Results  Component Value Date/Time   HGBA1C 6.4 (A) 05/31/2019 11:46 AM   HGBA1C 8.6 (H) 12/11/2018 05:58 AM   HGBA1C 6.4 (A) 12/12/2017 10:49 AM   HGBA1C 6.1 02/14/2016 11:26 AM   . Pharmacist Clinical Goal(s): o Over the next 6 months, patient will work with PharmD and providers to maintain A1c goal <7% . Current regimen:   metFORMIN (GLUCOPHAGE) 1000 MG tablet - 2 tablets with dinner  insulin aspart (NOVOLOG FLEXPEN) 100 UNIT/ML FlexPen 20-25 units before meals   Insulin Glargine, 2 Unit Dial, (TOUJEO MAX SOLOSTAR) 300 UNIT/ML SOPN - 66 units at bedtime  . Interventions: o Reviewed home BG log  . Patient self care activities - Over the next 6 months, patient will: o Check blood sugar twice daily, document, and provide at future appointments o Contact provider with any episodes of hypoglycemia o Schedule annual eye exam  Please see past updates related to this goal by clicking on the "Past Updates" button in the selected goal       Diabetes   Recent Relevant Labs: Lab Results  Component Value Date/Time   HGBA1C 6.4 (A) 05/31/2019 11:46 AM   HGBA1C 8.6 (H) 12/11/2018 05:58 AM   HGBA1C 6.4 (A) 12/12/2017 10:49 AM   HGBA1C 6.1 02/14/2016 11:26 AM    MICROALBUR 2.8 (H) 12/25/2010 10:39 AM   MICROALBUR 1.3 12/15/2009 09:07 AM    Patient has failed these meds in past: none reported Patient is currently controlled on the following medications:   metFORMIN (GLUCOPHAGE) 1000 MG tablet - 2 tablets with dinner  insulin aspart (NOVOLOG FLEXPEN) 100 UNIT/ML FlexPen 20-25 units before meals   Insulin Glargine, 2 Unit Dial, (TOUJEO MAX SOLOSTAR) 300 UNIT/ML SOPN - 66 units at bedtime   Last diabetic eye exam:  Lab Results  Component Value Date/Time   HMDIABEYEEXA No Retinopathy 01/12/2016 12:00 AM    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 Novolog dose depends on size of meal. Checking BG once or twice a day, BG 90 day average is 150, denies hypoglycemia, refills timely   Plan: Continue current medications; Due for annual eye exam.  Hypertension   CMP Latest Ref Rng & Units 04/29/2019 04/28/2019 04/28/2019  Glucose 70 - 99 mg/dL 116(H) - -  BUN 8 - 23 mg/dL 46(H) - -  Creatinine 0.61 - 1.24 mg/dL 1.20 - -  Sodium 135 - 145 mmol/L 139 - -  Potassium 3.5 - 5.1 mmol/L 4.0  5.1 5.1  Chloride 98 - 111 mmol/L 107 - -  CO2 22 - 32 mmol/L 21(L) - -  Calcium 8.9 - 10.3 mg/dL 8.5(L) - -  Total Protein 6.5 - 8.1 g/dL - - -  Total Bilirubin 0.3 - 1.2 mg/dL - - -  Alkaline Phos 38 - 126 U/L - - -  AST 15 - 41 U/L - - -  ALT 0 - 44 U/L - - -   Office blood pressures are: BP Readings from Last 3 Encounters:  05/31/19 120/76  04/29/19 (!) 121/58  04/07/19 118/80   BP goal < 140/90 mmHg Patient has failed these meds in the past: none reported Patient checks BP at home: does not have a monitor at home Patient home BP readings are ranging: none reported  Patient is currently controlled on the following medications:   carvedilol (COREG) 25 MG tablet - 1 tablet BID  losartan-hydrochlorothiazide (HYZAAR) 50-12.5 MG tablet - 1 tablet daily  Update 11/23/19:  has not checked BP, denies any dizziness, refills timely  Plan: Continue current medications  Hyperlipidemia/CAD   CBC Latest Ref Rng & Units 04/29/2019 04/28/2019 04/28/2019  WBC 4.0 - 10.5 K/uL 10.5 13.9(H) 20.9(H)  Hemoglobin 13.0 - 17.0 g/dL 8.2(L) 7.7(L) 9.3(L)  Hematocrit 39 - 52 % 25.3(L) 23.5(L) 28.6(L)  Platelets 150 - 400 K/uL 312 351 441(H)   Lipid Panel     Component Value Date/Time   CHOL 107 02/19/2017 1133   TRIG 143.0 02/19/2017 1133   HDL 26.40 (L) 02/19/2017 1133   CHOLHDL 4 02/19/2017 1133   VLDL 28.6 02/19/2017 1133   LDLCALC 52 02/19/2017 1133   LDLDIRECT 72.0 02/13/2015 1122    The ASCVD Risk score (Goff DC Jr., et al., 2013) failed to calculate for the following reasons:   The valid total cholesterol range is 130 to 320 mg/dL   LDL goal < 70 (CAD) Patient has failed these meds in past: muscle pain - atorvastatin, simvastatin (shoulders and leg pain), zetia  Patient is currently controlled on the following medications:   Aspirin 81 mg - 1 tablet daily  Update 11/23/19: changed aspirin 325 mg  to aspirin 81 mg after recent GI bleeding/diverticulitis  Plan: Continue current medications   Vaccines   Reviewed and discussed patient's vaccination history.    Immunization History  Administered Date(s) Administered  . Fluad Quad(high Dose 65+) 12/13/2018  . Influenza Split 12/17/2010  . Influenza Whole 11/07/2004, 11/07/2008  . Influenza, High Dose Seasonal PF 12/12/2017  . Influenza,inj,Quad PF,6+ Mos 10/05/2012, 10/06/2013, 12/29/2014, 12/04/2015, 11/05/2016  . PFIZER SARS-COV-2 Vaccination 02/21/2019, 03/16/2019  . Pneumococcal Conjugate-13 02/07/2014  . Pneumococcal Polysaccharide-23 11/12/2005, 02/14/2016  . Td 01/07/1997  . Tdap 12/25/2010  . Zoster 12/25/2010   Plan: Recommended patient receive shingles vaccine.   Medication Management: OTCs: multivitamin  Pharmacy/Benefits: Medicare with supplement ([employer]), [employer] Medicare Part D  (Mail order)/[facility]   Adherence: no concerns - does not use pillbox, but lines everything up, takes out of basket once taken  Affordability: denies concerns  CCM Follow Up: 6 months, telephone 05/24/20 at 2 PM   [name], PharmD Clinical Pharmacist [facility] at [facility] [phone]

## 2019-11-23 NOTE — Patient Instructions (Addendum)
Dear [name],  Below is a summary of the goals we discussed during our follow up appointment on November 23, 2019. Please contact me anytime with questions or concerns.   Visit Information  Goals Addressed            This Visit's Progress   . Pharmacy Care Plan       CARE PLAN ENTRY (see longitudinal plan of care for additional care plan information)  Current Barriers:  . Chronic Disease Management support, education, and care coordination needs related to Hypertension, Hyperlipidemia, and Diabetes   Hypertension BP Readings from Last 3 Encounters:  05/31/19 120/76  04/29/19 (!) 121/58  04/07/19 118/80   . Pharmacist Clinical Goal(s): o Over the next 6 months, patient will work with PharmD and providers to achieve BP goal <130/80 . Current regimen:   carvedilol (COREG) 25 MG tablet - 1 tablet BID  losartan-hydrochlorothiazide (HYZAAR) 50-12.5 MG tablet - 1 tablet daily . Interventions: o Reviewed labs and vitals . Patient self care activities - Over the next 6 months, patient will: o Ensure daily salt intake < 2300 mg/day  Hyperlipidemia/Coronary Artery Disease Lab Results  Component Value Date/Time   LDLCALC 52 02/19/2017 11:33 AM   LDLDIRECT 72.0 02/13/2015 11:22 AM   . Pharmacist Clinical Goal(s): o Over the next 6 months, patient will work with PharmD and providers to maintain LDL goal < 70 . Current regimen:  o Aspirin 81 mg - 1 tablet daily . Interventions: o Reviewed medication safety . Patient self care activities - Over the next 6 months, patient will: . Incorporate a healthy diet high in vegetables, fruits and whole grains with low-fat dairy products, chicken, fish, legumes, non-tropical vegetable oils and nuts. Limit intake of sweets, sugar-sweetened beverages and red meats. . Avoid aspirin containing products such as Excedrin and Goody's powder. Avoid NSAIDs including Advil (ibuprofen) and Aleve (naproxen).   Diabetes Lab Results  Component  Value Date/Time   HGBA1C 6.4 (A) 05/31/2019 11:46 AM   HGBA1C 8.6 (H) 12/11/2018 05:58 AM   HGBA1C 6.4 (A) 12/12/2017 10:49 AM   HGBA1C 6.1 02/14/2016 11:26 AM   . Pharmacist Clinical Goal(s): o Over the next 6 months, patient will work with PharmD and providers to maintain A1c goal <7% . Current regimen:   metFORMIN (GLUCOPHAGE) 1000 MG tablet - 2 tablets with dinner  insulin aspart (NOVOLOG FLEXPEN) 100 UNIT/ML FlexPen 20-25 units before meals   Insulin Glargine, 2 Unit Dial, (TOUJEO MAX SOLOSTAR) 300 UNIT/ML SOPN - 66 units at bedtime  . Interventions: o Reviewed home BG log  . Patient self care activities - Over the next 6 months, patient will: o Check blood sugar twice daily, document, and provide at future appointments o Contact provider with any episodes of hypoglycemia o Schedule annual eye exam  Please see past updates related to this goal by clicking on the "Past Updates" button in the selected goal        The patient verbalized understanding of instructions, educational materials, and care plan provided today and declined offer to receive copy of patient instructions, educational materials, and care plan.   Telephone follow up appointment with pharmacy team member scheduled for: 05/24/20 at 2 PM (telephone)  [name], PharmD Clinical Pharmacist [facility] at [facility] [phone]   [place] stands for "Dietary Approaches to Stop Hypertension." The DASH eating plan is a healthy eating plan that has been shown to reduce high blood pressure (hypertension). It may also  reduce your risk for type 2 diabetes, heart disease, and stroke. The DASH eating plan may also help with weight loss. What are tips for following this plan?  General guidelines  Avoid eating more than 2,300 mg (milligrams) of salt (sodium) a day. If you have hypertension, you may need to reduce your sodium intake to 1,500 mg a day.  Limit alcohol intake to no more than  1 drink a day for nonpregnant women and 2 drinks a day for men. One drink equals 12 oz of beer, 5 oz of wine, or 1 oz of hard liquor.  Work with your health care provider to maintain a healthy body weight or to lose weight. Ask what an ideal weight is for you.  Get at least 30 minutes of exercise that causes your heart to beat faster (aerobic exercise) most days of the week. Activities may include walking, swimming, or biking.  Work with your health care provider or diet and nutrition specialist (dietitian) to adjust your eating plan to your individual calorie needs. Reading food labels   Check food labels for the amount of sodium per serving. Choose foods with less than 5 percent of the Daily Value of sodium. Generally, foods with less than 300 mg of sodium per serving fit into this eating plan.  To find whole grains, look for the word "whole" as the first word in the ingredient list. Shopping  Buy products labeled as "low-sodium" or "no salt added."  Buy fresh foods. Avoid canned foods and premade or frozen meals. Cooking  Avoid adding salt when cooking. Use salt-free seasonings or herbs instead of table salt or sea salt. Check with your health care provider or pharmacist before using salt substitutes.  Do not fry foods. Cook foods using healthy methods such as baking, boiling, grilling, and broiling instead.  Cook with heart-healthy oils, such as olive, canola, soybean, or sunflower oil. Meal planning  Eat a balanced diet that includes: ? 5 or more servings of fruits and vegetables each day. At each meal, try to fill half of your plate with fruits and vegetables. ? Up to 6-8 servings of whole grains each day. ? Less than 6 oz of lean meat, poultry, or fish each day. A 3-oz serving of meat is about the same size as a deck of cards. One egg equals 1 oz. ? 2 servings of low-fat dairy each day. ? A serving of nuts, seeds, or beans 5 times each week. ? Heart-healthy fats. Healthy fats  called Omega-3 fatty acids are found in foods such as flaxseeds and coldwater fish, like sardines, salmon, and mackerel.  Limit how much you eat of the following: ? Canned or prepackaged foods. ? Food that is high in trans fat, such as fried foods. ? Food that is high in saturated fat, such as fatty meat. ? Sweets, desserts, sugary drinks, and other foods with added sugar. ? Full-fat dairy products.  Do not salt foods before eating.  Try to eat at least 2 vegetarian meals each week.  Eat more home-cooked food and less restaurant, buffet, and fast food.  When eating at a restaurant, ask that your food be prepared with less salt or no salt, if possible. What foods are recommended? The items listed may not be a complete list. Talk with your dietitian about what dietary choices are best for you. Grains Whole-grain or whole-wheat bread. Whole-grain or whole-wheat pasta. Brown rice. [place]. Bulgur. Whole-grain and low-sodium cereals. Pita bread. Low-fat, low-sodium crackers. Whole-wheat flour  tortillas. Vegetables Fresh or frozen vegetables (raw, steamed, roasted, or grilled). Low-sodium or reduced-sodium tomato and vegetable juice. Low-sodium or reduced-sodium tomato sauce and tomato paste. Low-sodium or reduced-sodium canned vegetables. Fruits All fresh, dried, or frozen fruit. Canned fruit in natural juice (without added sugar). Meat and other protein foods Skinless chicken or [place]. Ground chicken or [place]. Pork with fat trimmed off. Fish and seafood. Egg whites. Dried beans, peas, or lentils. Unsalted nuts, nut butters, and seeds. Unsalted canned beans. Lean cuts of beef with fat trimmed off. Low-sodium, lean deli meat. Dairy Low-fat (1%) or fat-free (skim) milk. Fat-free, low-fat, or reduced-fat cheeses. Nonfat, low-sodium ricotta or cottage cheese. Low-fat or nonfat yogurt. Low-fat, low-sodium cheese. Fats and oils Soft margarine without trans fats. Vegetable oil. Low-fat,  reduced-fat, or light mayonnaise and salad dressings (reduced-sodium). Canola, safflower, olive, soybean, and sunflower oils. Avocado. Seasoning and other foods Herbs. Spices. Seasoning mixes without salt. Unsalted popcorn and pretzels. Fat-free sweets. What foods are not recommended? The items listed may not be a complete list. Talk with your dietitian about what dietary choices are best for you. Grains Baked goods made with fat, such as croissants, muffins, or some breads. Dry pasta or rice meal packs. Vegetables Creamed or fried vegetables. Vegetables in a cheese sauce. Regular canned vegetables (not low-sodium or reduced-sodium). Regular canned tomato sauce and paste (not low-sodium or reduced-sodium). Regular tomato and vegetable juice (not low-sodium or reduced-sodium). Angie Fava. Olives. Fruits Canned fruit in a light or heavy syrup. Fried fruit. Fruit in cream or butter sauce. Meat and other protein foods Fatty cuts of meat. Ribs. Fried meat. Berniece Salines. Sausage. Bologna and other processed lunch meats. Salami. Fatback. Hotdogs. Bratwurst. Salted nuts and seeds. Canned beans with added salt. Canned or smoked fish. Whole eggs or egg yolks. Chicken or [place] with skin. Dairy Whole or 2% milk, cream, and half-and-half. Whole or full-fat cream cheese. Whole-fat or sweetened yogurt. Full-fat cheese. Nondairy creamers. Whipped toppings. Processed cheese and cheese spreads. Fats and oils Butter. Stick margarine. Lard. Shortening. Ghee. Bacon fat. Tropical oils, such as coconut, palm kernel, or palm oil. Seasoning and other foods Salted popcorn and pretzels. Onion salt, garlic salt, seasoned salt, table salt, and sea salt. Worcestershire sauce. Tartar sauce. Barbecue sauce. Teriyaki sauce. Soy sauce, including reduced-sodium. Steak sauce. Canned and packaged gravies. Fish sauce. Oyster sauce. Cocktail sauce. Horseradish that you find on the shelf. Ketchup. Mustard. Meat flavorings and tenderizers.  Bouillon cubes. Hot sauce and Tabasco sauce. Premade or packaged marinades. Premade or packaged taco seasonings. Relishes. Regular salad dressings. Where to find more information:  National Heart, Lung, and [place]: https://wilson-eaton.com/  American Heart Association: www.heart.org Summary  The DASH eating plan is a healthy eating plan that has been shown to reduce high blood pressure (hypertension). It may also reduce your risk for type 2 diabetes, heart disease, and stroke.  With the DASH eating plan, you should limit salt (sodium) intake to 2,300 mg a day. If you have hypertension, you may need to reduce your sodium intake to 1,500 mg a day.  When on the DASH eating plan, aim to eat more fresh fruits and vegetables, whole grains, lean proteins, low-fat dairy, and heart-healthy fats.  Work with your health care provider or diet and nutrition specialist (dietitian) to adjust your eating plan to your individual calorie needs. This information is not intended to replace advice given to you by your health care provider. Make sure you discuss any questions you have with your health care provider.  Document Revised: 12/06/2016 Document Reviewed: 12/18/2015 Elsevier Patient Education  2020 [employer].

## 2019-11-29 ENCOUNTER — Other Ambulatory Visit: Payer: Self-pay | Admitting: Orthopedic Surgery

## 2019-12-14 DIAGNOSIS — Z23 Encounter for immunization: Secondary | ICD-10-CM | POA: Diagnosis not present

## 2019-12-28 ENCOUNTER — Other Ambulatory Visit: Payer: Self-pay | Admitting: Internal Medicine

## 2020-01-19 ENCOUNTER — Other Ambulatory Visit: Payer: Self-pay | Admitting: Orthopedic Surgery

## 2020-01-31 ENCOUNTER — Telehealth: Payer: Self-pay

## 2020-01-31 NOTE — Chronic Care Management (AMB) (Addendum)
Chronic Care Management Pharmacy Assistant   Name: [name]  MRN: [id number] DOB: 1950-07-03  Reason for Encounter: Medication Review- General Adherence Review  Patient Questions:  1.  Have you seen any other providers since your last visit? No  2.  Any changes in your medicines or health? No  PCP : [name], MD  Allergies:   Allergies  Allergen Reactions   Atorvastatin Other (See Comments)    myalgias   Ezetimibe Other (See Comments)    Body ache   Simvastatin Other (See Comments)    Body ache    Medications: Outpatient Encounter Medications as of 01/31/2020  Medication Sig   ACCU-CHEK GUIDE test strip USE AS DIRECTED TO CHECK BLOOD SUGAR 3 TIMES DAILY   carvedilol (COREG) 25 MG tablet TAKE 1 TABLET TWICE DAILY   DROPLET PEN NEEDLES 31G X 5 MM MISC USE TO INJECT INSULIN FOUR TIMES DAILY AS INSTRUCTED (FOR DIABETES)   insulin aspart (NOVOLOG FLEXPEN) 100 UNIT/ML FlexPen Inject 25-30 Units into the skin 3 (three) times daily with meals. Sliding Scale Insulin   insulin glargine, 2 Unit Dial, (TOUJEO MAX SOLOSTAR) 300 UNIT/ML Solostar Pen Inject 60 Units into the skin at bedtime. (Patient taking differently: Inject 66 Units into the skin at bedtime. )   losartan-hydrochlorothiazide (HYZAAR) 50-12.5 MG tablet TAKE 1 TABLET EVERY DAY   metFORMIN (GLUCOPHAGE) 1000 MG tablet TAKE 1 TABLET EVERY DAY   Multiple Vitamin (MULTIVITAMIN WITH MINERALS) TABS tablet Take 1 tablet by mouth daily. Centrum Silver for Men 50+   mupirocin ointment (BACTROBAN) 2 % APPLY TO THE AFFECTED AREA TOPICALLY DAILY   No facility-administered encounter medications on file as of 01/31/2020.    Current Diagnosis: Patient Active Problem List   Diagnosis Date Noted   GIB (gastrointestinal bleeding) 04/28/2019   Acute upper GI bleed 04/27/2019   PVD (peripheral vascular disease) ([place]) 04/27/2019   Hyperkalemia 04/27/2019   Obesity (BMI 30.0-34.9) 04/27/2019   Above knee amputation of right  lower extremity ([place]) 12/14/2018   Type 2 diabetes mellitus with peripheral neuropathy ([place])    Left above-knee amputee ([place]) 12/26/2017   History of complete ray amputation of fifth toe of right foot ([place])    Essential hypertension    Chronic renal disease, stage III ([place]) 02/19/2017   Type 2 diabetes mellitus with diabetic polyneuropathy, with long-term current use of insulin ([place]) 03/29/2015   Advance directive discussed with patient 02/07/2014   Leucocytosis 04/01/2013   Routine general medical examination at a health care facility 12/25/2010   PERSONAL HX COLONIC POLYPS 11/22/2009   Venous (peripheral) insufficiency 06/29/2008   Osteoarthritis, multiple sites 04/03/2007   ERECTILE DYSFUNCTION, ORGANIC 06/13/2006   Unspecified glaucoma 06/11/2006   Coronary atherosclerosis of native coronary artery 06/11/2006   Obstructive sleep apnea 06/11/2006    Contacted [name] for general health assessment. Since last visit with CPP 11/23/19, no interventions have been made.  The patient has not had an ED visit since their last CPP follow up. The patients current Chronic and PDC medications are: losartan-hydrochlorothiazide 50-12.5 takes 1 tablet daily. Novolog flex pen- injects 25-30 units in to the skin 3 times daily with a meal (states usually 25), Toujeo Max- inject 60 units into the skin at bedtime (patient notes he injects 66 units), metformin 1000 mg, takes 1 tablet daily.   [name], Pharm. D to review for a greater than 5 day gap between last fill.The patient has not had problems with their health recently.  The patient denies any problems with their pharmacy. The patient has not had any side effects with their medicines. The patient has no recommendations for improvements in managing care.  Inquired about diabetic eye exam. He states that he is not wanting to go to any appointments while Covid-19 is still going on. He notes he will make appointment for eye exam when things settle down.  Patient did not have any blood pressure readings as he states he does not have a monitor. Patient notes blood sugar averages as follows:  7 day average: 168 14 day average: 168 30 day average: 170 90 day average: 173 Patient did not have specific readings available, only the averages.            Follow-Up:  Pharmacist Review   [name], CPP notified  [name], [place] Assistant [phone]  Would like to review his fasting BG readings since his BG average is up. 90 day average was 150 at [facility] visit November 2021. Will have Ria Comment call patient this month to confirm patient is taking metformin 2000 mg 2 tablets at supper, not 1 tablet (2000 mg/day is prescribed per endocrinology notes) and to review specific readings if patient is able to provide. Aware patient has been taking Toujeo 66 units and usually 25 units Novolog before meals.  [name], PharmD Clinical Pharmacist [facility] at [facility] [phone]

## 2020-02-09 ENCOUNTER — Telehealth: Payer: Self-pay

## 2020-02-09 NOTE — Chronic Care Management (AMB) (Addendum)
Chronic Care Management Pharmacy Assistant   Name: [name]  MRN: [id number] DOB: [birth year]  Reason for Encounter: Disease State- Diabetes   PCP : [name], MD  Allergies:   Allergies  Allergen Reactions   Atorvastatin Other (See Comments)    myalgias   Ezetimibe Other (See Comments)    Body ache   Simvastatin Other (See Comments)    Body ache    Medications: Outpatient Encounter Medications as of 02/09/2020  Medication Sig   ACCU-CHEK GUIDE test strip USE AS DIRECTED TO CHECK BLOOD SUGAR 3 TIMES DAILY   carvedilol (COREG) 25 MG tablet TAKE 1 TABLET TWICE DAILY   DROPLET PEN NEEDLES 31G X 5 MM MISC USE TO INJECT INSULIN FOUR TIMES DAILY AS INSTRUCTED (FOR DIABETES)   insulin aspart (NOVOLOG FLEXPEN) 100 UNIT/ML FlexPen Inject 25-30 Units into the skin 3 (three) times daily with meals. Sliding Scale Insulin   insulin glargine, 2 Unit Dial, (TOUJEO MAX SOLOSTAR) 300 UNIT/ML Solostar Pen Inject 60 Units into the skin at bedtime. (Patient taking differently: Inject 66 Units into the skin at bedtime. )   losartan-hydrochlorothiazide (HYZAAR) 50-12.5 MG tablet TAKE 1 TABLET EVERY DAY   metFORMIN (GLUCOPHAGE) 1000 MG tablet TAKE 1 TABLET EVERY DAY   Multiple Vitamin (MULTIVITAMIN WITH MINERALS) TABS tablet Take 1 tablet by mouth daily. Centrum Silver for Men 50+   mupirocin ointment (BACTROBAN) 2 % APPLY TO THE AFFECTED AREA TOPICALLY DAILY   No facility-administered encounter medications on file as of 02/09/2020.    Current Diagnosis: Patient Active Problem List   Diagnosis Date Noted   GIB (gastrointestinal bleeding) 04/28/2019   Acute upper GI bleed 04/27/2019   PVD (peripheral vascular disease) ([place]) 04/27/2019   Hyperkalemia 04/27/2019   Obesity (BMI 30.0-34.9) 04/27/2019   Above knee amputation of right lower extremity ([place]) 12/14/2018   Type 2 diabetes mellitus with peripheral neuropathy ([place])    Left above-knee amputee ([place]) 12/26/2017   History of  complete ray amputation of fifth toe of right foot ([place])    Essential hypertension    Chronic renal disease, stage III ([place]) 02/19/2017   Type 2 diabetes mellitus with diabetic polyneuropathy, with long-term current use of insulin ([place]) 03/29/2015   Advance directive discussed with patient 02/07/2014   Leucocytosis 04/01/2013   Routine general medical examination at a health care facility 12/25/2010   PERSONAL HX COLONIC POLYPS 11/22/2009   Venous (peripheral) insufficiency 06/29/2008   Osteoarthritis, multiple sites 04/03/2007   ERECTILE DYSFUNCTION, ORGANIC 06/13/2006   Unspecified glaucoma 06/11/2006   Coronary atherosclerosis of native coronary artery 06/11/2006   Obstructive sleep apnea 06/11/2006    Recent Relevant Labs: Lab Results  Component Value Date/Time   HGBA1C 6.4 (A) 05/31/2019 11:46 AM   HGBA1C 8.6 (H) 12/11/2018 05:58 AM   HGBA1C 6.4 (A) 12/12/2017 10:49 AM   HGBA1C 6.1 02/14/2016 11:26 AM   MICROALBUR 2.8 (H) 12/25/2010 10:39 AM   MICROALBUR 1.3 12/15/2009 09:07 AM    Kidney Function Lab Results  Component Value Date/Time   CREATININE 1.20 04/29/2019 05:39 AM   CREATININE 1.62 (H) 04/28/2019 03:02 AM   CREATININE 0.98 10/05/2012 05:27 PM   GFR 69.86 02/19/2017 11:33 AM   GFRNONAA >60 04/29/2019 05:39 AM   GFRAA >60 04/29/2019 05:39 AM    Attempted to contact [name] on 3 separate occasions to obtain updated blood glucose log. Multiple messages left for patient to return call. No success.  [name] requested I review log since  his 90 day BG average increased from CCM visit November 2021. Also to confirm patient is taking metformin 2000 mg 2 tablets at supper, not 1 tablet (2000 mg/day is prescribed per endocrinology notes). Will await patient return call.    Current antihyperglycemic regimen:  metFORMIN (GLUCOPHAGE) 1000 MG tablet - 2 tablets with dinner insulin aspart (NOVOLOG FLEXPEN) 100 UNIT/ML FlexPen 25-30 units 3 times a day before meals   Insulin Glargine, 2 Unit Dial, (TOUJEO MAX SOLOSTAR) 300 UNIT/ML SOPN - 66 units at bedtime   What recent interventions/DTPs have been made to improve glycemic control:  None   Follow-Up:  Pharmacist Review  [name], CPP notified  [name], [place] Assistant [phone]  I have reviewed the care management and care coordination activities outlined in this encounter and I am certifying that I agree with the content of this note. No further action required.  [name], PharmD Clinical Pharmacist [facility] at [facility] [phone]

## 2020-03-08 ENCOUNTER — Other Ambulatory Visit: Payer: Self-pay | Admitting: Internal Medicine

## 2020-03-13 ENCOUNTER — Other Ambulatory Visit: Payer: Self-pay | Admitting: Orthopedic Surgery

## 2020-03-23 ENCOUNTER — Telehealth: Payer: Self-pay

## 2020-03-23 NOTE — Chronic Care Management (AMB) (Addendum)
    Chronic Care Management Pharmacy Assistant   Name: [name]  MRN: [id number] DOB: February 24, 1950  Reason for Encounter: Disease State- Diabetes   Conditions to be addressed/monitored: HTN  Recent office visits:  No office visits since last CCM appointment  Recent consult visits:  No office visits since last [facility] visits:  None in previous 6 months  Medications: Outpatient Encounter Medications as of 03/23/2020  Medication Sig   ACCU-CHEK GUIDE test strip USE AS DIRECTED TO CHECK BLOOD SUGAR 3 TIMES DAILY   carvedilol (COREG) 25 MG tablet TAKE 1 TABLET TWICE DAILY   DROPLET PEN NEEDLES 31G X 5 MM MISC USE TO INJECT INSULIN FOUR TIMES DAILY AS INSTRUCTED (FOR DIABETES)   insulin aspart (NOVOLOG FLEXPEN) 100 UNIT/ML FlexPen Inject 25-30 Units into the skin 3 (three) times daily with meals. Sliding Scale Insulin   insulin glargine, 2 Unit Dial, (TOUJEO MAX SOLOSTAR) 300 UNIT/ML Solostar Pen Inject 60 Units into the skin at bedtime. (Patient taking differently: Inject 66 Units into the skin at bedtime. )   losartan-hydrochlorothiazide (HYZAAR) 50-12.5 MG tablet TAKE 1 TABLET EVERY DAY   metFORMIN (GLUCOPHAGE) 1000 MG tablet TAKE 1 TABLET EVERY DAY   Multiple Vitamin (MULTIVITAMIN WITH MINERALS) TABS tablet Take 1 tablet by mouth daily. Centrum Silver for Men 50+   mupirocin ointment (BACTROBAN) 2 % APPLY TO THE AFFECTED AREA TOPICALLY DAILY   No facility-administered encounter medications on file as of 03/23/2020.     Recent Relevant Labs: Lab Results  Component Value Date/Time   HGBA1C 6.4 (A) 05/31/2019 11:46 AM   HGBA1C 8.6 (H) 12/11/2018 05:58 AM   HGBA1C 6.4 (A) 12/12/2017 10:49 AM   HGBA1C 6.1 02/14/2016 11:26 AM   MICROALBUR 2.8 (H) 12/25/2010 10:39 AM   MICROALBUR 1.3 12/15/2009 09:07 AM    Kidney Function Lab Results  Component Value Date/Time   CREATININE 1.20 04/29/2019 05:39 AM   CREATININE 1.62 (H) 04/28/2019 03:02 AM   CREATININE 0.98  10/05/2012 05:27 PM   GFR 69.86 02/19/2017 11:33 AM   GFRNONAA >60 04/29/2019 05:39 AM   GFRAA >60 04/29/2019 05:39 AM   Attempted to contact patient  03/23/20, 03/28/20 and 03/30/20 for diabetes disease state call. Multiple messages left. Unsuccessful outreach. Patient scheduled with [name], Pharm. D, 05/24/20.  Current antihyperglycemic regimen:  metFORMIN (GLUCOPHAGE) 1000 MG tablet - 2 tablets with dinner insulin aspart (NOVOLOG FLEXPEN) 100 UNIT/ML FlexPen 20-25 units before meals  Insulin Glargine, 2 Unit Dial, (TOUJEO MAX SOLOSTAR) 300 UNIT/ML SOPN - 66 units at bedtime   Have there been any recent hospitalizations or ED visits since last visit with CPP? No  On insulin? Yes   Adherence Review: Is the patient currently on a STATIN medication? No Is the patient currently on ACE/ARB medication? Yes Does the patient have >5 day gap between last estimated fill dates? Yes - ACE > 5 days, metformin > 5 days  Star Rating Drugs: Medication:  Last Fill: Day Supply Metformin 1000 10/31/19 90 Losartan-HCTZ 50-12.5  10/31/19 90   Follow-Up:  Pharmacist Review  [name], CPP notified  [name], [place] [phone]  Total time spent for month:  CPA: 25  I have reviewed the care management and care coordination activities outlined in this encounter and I am certifying that I agree with the content of this note. No further action required.  [name], PharmD Clinical Pharmacist [facility] at [facility] [phone]

## 2020-04-10 ENCOUNTER — Encounter: Payer: Self-pay | Admitting: Internal Medicine

## 2020-04-10 ENCOUNTER — Ambulatory Visit (INDEPENDENT_AMBULATORY_CARE_PROVIDER_SITE_OTHER): Payer: Medicare Other | Admitting: Internal Medicine

## 2020-04-10 ENCOUNTER — Other Ambulatory Visit: Payer: Self-pay

## 2020-04-10 VITALS — BP 134/76 | HR 91 | Temp 97.9°F

## 2020-04-10 DIAGNOSIS — S78111A Complete traumatic amputation at level between right hip and knee, initial encounter: Secondary | ICD-10-CM | POA: Diagnosis not present

## 2020-04-10 DIAGNOSIS — I739 Peripheral vascular disease, unspecified: Secondary | ICD-10-CM | POA: Diagnosis not present

## 2020-04-10 DIAGNOSIS — E1142 Type 2 diabetes mellitus with diabetic polyneuropathy: Secondary | ICD-10-CM

## 2020-04-10 DIAGNOSIS — Z7189 Other specified counseling: Secondary | ICD-10-CM | POA: Diagnosis not present

## 2020-04-10 DIAGNOSIS — Z89612 Acquired absence of left leg above knee: Secondary | ICD-10-CM

## 2020-04-10 DIAGNOSIS — I10 Essential (primary) hypertension: Secondary | ICD-10-CM

## 2020-04-10 DIAGNOSIS — Z0000 Encounter for general adult medical examination without abnormal findings: Secondary | ICD-10-CM | POA: Diagnosis not present

## 2020-04-10 DIAGNOSIS — G4733 Obstructive sleep apnea (adult) (pediatric): Secondary | ICD-10-CM | POA: Diagnosis not present

## 2020-04-10 DIAGNOSIS — Z794 Long term (current) use of insulin: Secondary | ICD-10-CM | POA: Diagnosis not present

## 2020-04-10 DIAGNOSIS — Z8546 Personal history of malignant neoplasm of prostate: Secondary | ICD-10-CM

## 2020-04-10 LAB — HM DIABETES FOOT EXAM

## 2020-04-10 NOTE — Assessment & Plan Note (Signed)
BP Readings from Last 3 Encounters:  04/10/20 134/76  05/31/19 120/76  04/29/19 (!) 121/58   Good control without meds

## 2020-04-10 NOTE — Assessment & Plan Note (Signed)
See social history 

## 2020-04-10 NOTE — Assessment & Plan Note (Signed)
I have personally reviewed the Medicare Annual Wellness questionnaire and have noted 1. The patient's medical and social history 2. Their use of alcohol, tobacco or illicit drugs 3. Their current medications and supplements 4. The patient's functional ability including ADL's, fall risks, home safety risks and hearing or visual             impairment. 5. Diet and physical activities 6. Evidence for depression or mood disorders  The patients weight, height, BMI and visual acuity have been recorded in the chart I have made referrals, counseling and provided education to the patient based review of the above and I have provided the pt with a written personalized care plan for preventive services.  I have provided you with a copy of your personalized plan for preventive services. Please take the time to review along with your updated medication list.  Last colon done 2021---no need to repeat No PSA due to age COVID booster later in year and flu vaccine in fall Stays as active as possible

## 2020-04-10 NOTE — Assessment & Plan Note (Signed)
Has adapted well

## 2020-04-10 NOTE — Assessment & Plan Note (Signed)
Treated with seeds and RT Hasn't been checked in some time

## 2020-04-10 NOTE — Assessment & Plan Note (Signed)
Has adapted well Girlfriend does the driving He can transfer on his own

## 2020-04-10 NOTE — Progress Notes (Signed)
Hearing Screening   125Hz  250Hz  500Hz  1000Hz  2000Hz  3000Hz  4000Hz  6000Hz  8000Hz   Right ear:           Left ear:           Comments: Aware of high frequency hearing loss   Visual Acuity Screening   Right eye Left eye Both eyes  Without correction:     With correction: 20/20 20/50 20/13

## 2020-04-10 NOTE — Assessment & Plan Note (Signed)
Had bilateral leg amputations

## 2020-04-10 NOTE — Assessment & Plan Note (Signed)
Seems to have good control on long and short acting insulin and metformin Will check labs S/P amputations

## 2020-04-10 NOTE — Progress Notes (Signed)
Subjective:    Patient ID: [name], male    DOB: 1950/12/17, 70 y.o.   MRN: [id number]  HPI Here for Medicare wellness visit and follow up of chronic health conditions Girlfriend brought him This visit occurred during the SARS-CoV-2 public health emergency.  Safety protocols were in place, including screening questions prior to the visit, additional usage of staff PPE, and extensive cleaning of exam room while observing appropriate contact time as indicated for disinfecting solutions.   Reviewed form and advanced directives Reviewed other doctors No alcohol or tobacco Not able to exercise--but goes out to work in [place] stable Hearing is poor---no aides No falls No depression or anhedonia---gets down at times in the winter He helps with housework--cooking, dishes, etc Doesn't drive No sig memory issues  Diabetes has been okay Checks once or twice a day Average for last 90 days 148----last 30 days 138 No hypoglycemic reactions  Transfers with slide board for the most part (can go right to shower chair or commode). Has trapeze to facilitate bed movement Bilateral leg amputations  Known aortic atherosclerosis Hasn't tolerated statins  No chest pain  No palpitations No SOB No dizziness or syncope  Current Outpatient Medications on File Prior to Visit  Medication Sig Dispense Refill  . ACCU-CHEK GUIDE test strip USE AS DIRECTED TO CHECK BLOOD SUGAR 3 TIMES DAILY 300 strip 2  . carvedilol (COREG) 25 MG tablet TAKE 1 TABLET TWICE DAILY 180 tablet 3  . DROPLET PEN NEEDLES 31G X 5 MM MISC USE TO INJECT INSULIN FOUR TIMES DAILY AS INSTRUCTED (FOR DIABETES) 400 each 11  . insulin aspart (NOVOLOG FLEXPEN) 100 UNIT/ML FlexPen Inject 25-30 Units into the skin 3 (three) times daily with meals. Sliding Scale Insulin 25 pen 3  . insulin glargine, 2 Unit Dial, (TOUJEO MAX SOLOSTAR) 300 UNIT/ML Solostar Pen Inject 60 Units into the skin at bedtime. (Patient taking  differently: Inject 66 Units into the skin at bedtime.) 9 pen 3  . losartan-hydrochlorothiazide (HYZAAR) 50-12.5 MG tablet TAKE 1 TABLET EVERY DAY 90 tablet 3  . metFORMIN (GLUCOPHAGE) 1000 MG tablet TAKE 1 TABLET EVERY DAY (Patient taking differently: 2,000 mg.) 30 tablet 0  . Multiple Vitamin (MULTIVITAMIN WITH MINERALS) TABS tablet Take 1 tablet by mouth daily. Centrum Silver for Men 50+    . mupirocin ointment (BACTROBAN) 2 % APPLY TO THE AFFECTED AREA TOPICALLY DAILY 22 g 1   No current facility-administered medications on file prior to visit.    Allergies  Allergen Reactions  . Atorvastatin Other (See Comments)    myalgias  . Ezetimibe Other (See Comments)    Body ache  . Simvastatin Other (See Comments)    Body ache    Past Medical History:  Diagnosis Date  . Basal cell carcinoma 02/23/2019   nodular/infiltrative- right forearm-mohs  . Cellulitis and abscess of left lower extremity 12/20/2017  . Coronary atherosclerosis of unspecified type of vessel, native or graft   . Hemorrhage of rectum and anus   . History of kidney stones    passed stones  . Impotence of organic origin   . Internal hemorrhoids without mention of complication   . Malignant neoplasm of prostate ([place])   . Mononeuritis of unspecified site   . Osteoarthrosis, unspecified whether generalized or localized, unspecified site   . Other and unspecified hyperlipidemia   . Peripheral vascular disease (HCC)    B BKA  . Personal history of colonic polyps   . Personal history  of gallstones   . Subacute osteomyelitis, right ankle and foot ([place])   . Type II or unspecified type diabetes mellitus with neurological manifestations, not stated as uncontrolled(250.60)   . Unspecified glaucoma(365.9)   . Unspecified sleep apnea    cpcp    Past Surgical History:  Procedure Laterality Date  . AMPUTATION Left 12/30/2012   Procedure: AMPUTATION RAY ;  Surgeon: Meredith Pel, MD;  Location: [facility];  Service:  Orthopedics;  Laterality: Left;  LEFT GREAT TOE RAY AMPUTATION  . AMPUTATION Left 02/23/2016   Procedure: Left Below Knee Amputation;  Surgeon: [name], MD;  Location: [facility];  Service: Orthopedics;  Laterality: Left;  . AMPUTATION Left 12/24/2017   Procedure: LEFT ABOVE KNEE AMPUTATION;  Surgeon: [name], MD;  Location: [place];  Service: Orthopedics;  Laterality: Left;  . AMPUTATION Right 12/24/2017   Procedure: RIGHT 5TH RAY AMPUTATION;  Surgeon: [name], MD;  Location: [place];  Service: Orthopedics;  Laterality: Right;  . AMPUTATION Right 12/11/2018   Procedure: RIGHT ABOVE KNEE AMPUTATION;  Surgeon: [name], MD;  Location: [place] [facility];  Service: Orthopedics;  Laterality: Right;  . APPENDECTOMY    . BASAL CELL CARCINOMA EXCISION  2/16   left forearm  . BELPHAROPTOSIS REPAIR    . CARDIAC CATHETERIZATION  1998   Negative  . CATARACT EXTRACTION Right 2017   then lid surgery  . COLONOSCOPY    . COLONOSCOPY WITH PROPOFOL N/A 04/29/2019   Procedure: COLONOSCOPY WITH PROPOFOL;  Surgeon: Jackquline Denmark, MD;  [facility];  Service: Endoscopy;  Laterality: N/A;  . CORONARY ARTERY BYPASS GRAFT  09/2005   5 bypasses per patient, Post op AFIB  . ESOPHAGOGASTRODUODENOSCOPY (EGD) WITH PROPOFOL N/A 04/29/2019   Procedure: ESOPHAGOGASTRODUODENOSCOPY (EGD) WITH PROPOFOL;  Surgeon: Jackquline Denmark, MD;  Location: [facility]COPY;  Service: Endoscopy;  Laterality: N/A;  . EYE SURGERY    . FOOT BONE EXCISION Left 11/03/2013   DR DUDA   . I & D EXTREMITY Left 11/03/2013   Procedure: Left Foot Partial Bone Excision Cuboid and Medial Cuneiform, Wound Closures;  Surgeon: [name], MD;  Location: [place];  Service: Orthopedics;  Laterality: Left;  . INSERTION PROSTATE RADIATION SEED  2009   RT and seeds for prostate cancer  . KIDNEY STONE SURGERY  04/1993  . RCA stents  04/2003   EF 55%  . RETINAL DETACHMENT SURGERY  2002-2003  . thrombosed vein  1993   Right leg    Family History   Problem Relation Age of Onset  . Lung cancer Father   . Multiple sclerosis Mother   . Heart attack Other        paternal aunts and uncles  . Peripheral vascular disease Maternal Grandfather        several amputations    Social History   Socioeconomic History  . Marital status: Widowed    Spouse name: Not on file  . Number of children: 3  . Years of education: Not on file  . Highest education level: Not on file  Occupational History  . Occupation: Heavy equipment mechanic--retired  Tobacco Use  . Smoking status: Former Smoker    Packs/day: 2.00    Years: 35.00    Pack years: 70.00    Types: Cigars, Cigarettes    Quit date: 01/07/2001    Years since quitting: 19.2  . Smokeless tobacco: Never Used  Vaping Use  . Vaping Use: Never used  Substance and  Sexual Activity  . Alcohol use: Not Currently    Comment: rare  . Drug use: No  . Sexual activity: Yes    Partners: Female  Other Topics Concern  . Not on file  Social History Narrative   Has living will   Daughter [name] is [facility] health care POA    Would accept resuscitation attempts--but no prolonged artificial ventilation   No tube feeds if cognitively unaware   Social Determinants of Health   Financial Resource Strain: Not on file  Food Insecurity: Not on file  Transportation Needs: Not on file  Physical Activity: Not on file  Stress: Not on file  Social Connections: Not on file  Intimate Partner Violence: Not on file   Review of Systems Appetite is good Weight is stable Sleeps pretty good Teeth okay--keeps up with dentist No skin issues or lesions No heartburn or dysphagia Bowels are fine--no blood Voids okay---stream is good. Past kidney stones---passed one 3-4 weeks ago Chronic hand/shoulder pain. Phantom pain in right knee also. Uses aleve prn (not that often)    Objective:   Physical Exam Constitutional:      Appearance: Normal appearance.  HENT:     Mouth/Throat:     Comments: No  lesions Eyes:     Conjunctiva/sclera: Conjunctivae normal.     Pupils: Pupils are equal, round, and reactive to light.  Cardiovascular:     Rate and Rhythm: Normal rate and regular rhythm.     Heart sounds: No murmur heard. No gallop.   Pulmonary:     Effort: Pulmonary effort is normal.     Breath sounds: Normal breath sounds. No wheezing or rales.  Abdominal:     Palpations: Abdomen is soft.     Tenderness: There is no abdominal tenderness.  Musculoskeletal:     Cervical back: Neck supple.     Comments: No active synovitis  Lymphadenopathy:     Cervical: No cervical adenopathy.  Skin:    Findings: No rash.     Comments: No feet Both stumps clean and dry  Neurological:     Mental Status: He is alert.     Comments: President---"Joe Biden, Trump, Obama" [id number] Recall 3/3  Psychiatric:        Mood and Affect: Mood normal.        Behavior: Behavior normal.            Assessment & Plan:

## 2020-04-10 NOTE — Assessment & Plan Note (Signed)
Uses CPAP every night with good results

## 2020-04-11 LAB — T4, FREE: Free T4: 0.84 ng/dL (ref 0.60–1.60)

## 2020-04-11 LAB — RENAL FUNCTION PANEL
Albumin: 4.1 g/dL (ref 3.5–5.2)
BUN: 21 mg/dL (ref 6–23)
CO2: 25 mEq/L (ref 19–32)
Calcium: 9.2 mg/dL (ref 8.4–10.5)
Chloride: 103 mEq/L (ref 96–112)
Creatinine, Ser: 1.03 mg/dL (ref 0.40–1.50)
GFR: 72.88 mL/min (ref >60.00)
Glucose, Bld: 170 mg/dL — ABNORMAL HIGH (ref 70–99)
Phosphorus: 3.9 mg/dL (ref 2.3–4.6)
Potassium: 4.8 mEq/L (ref 3.5–5.1)
Sodium: 138 mEq/L (ref 135–145)

## 2020-04-11 LAB — CBC
HCT: 47.5 % (ref 39.0–52.0)
Hemoglobin: 15.9 g/dL (ref 13.0–17.0)
MCHC: 33.6 g/dL (ref 30.0–36.0)
MCV: 68.9 fl — ABNORMAL LOW (ref 78.0–100.0)
Platelets: 274 10*3/uL (ref 150.0–400.0)
RBC: 6.9 Mil/uL — ABNORMAL HIGH (ref 4.22–5.81)
RDW: 25.3 % — ABNORMAL HIGH (ref 11.5–15.5)
WBC: 9.9 10*3/uL (ref 4.0–10.5)

## 2020-04-11 LAB — HEPATIC FUNCTION PANEL
ALT: 12 U/L (ref 0–53)
AST: 15 U/L (ref 0–37)
Albumin: 4.1 g/dL (ref 3.5–5.2)
Alkaline Phosphatase: 42 U/L (ref 39–117)
Bilirubin, Direct: 0.1 mg/dL (ref 0.0–0.3)
Total Bilirubin: 0.8 mg/dL (ref 0.2–1.2)
Total Protein: 6.2 g/dL (ref 6.0–8.3)

## 2020-04-11 LAB — LDL CHOLESTEROL, DIRECT: Direct LDL: 38 mg/dL

## 2020-04-11 LAB — LIPID PANEL
Cholesterol: 88 mg/dL (ref 0–200)
HDL: 19.4 mg/dL — ABNORMAL LOW (ref >39.00)
Total CHOL/HDL Ratio: 5
Triglycerides: 473 mg/dL — ABNORMAL HIGH (ref 0.0–149.0)

## 2020-04-11 LAB — PSA: PSA: 0 ng/mL — ABNORMAL LOW (ref 0.10–4.00)

## 2020-04-11 LAB — HEMOGLOBIN A1C: Hgb A1c MFr Bld: 6.8 % — ABNORMAL HIGH (ref 4.6–6.5)

## 2020-05-01 ENCOUNTER — Other Ambulatory Visit: Payer: Self-pay | Admitting: Internal Medicine

## 2020-05-11 ENCOUNTER — Encounter: Payer: Self-pay | Admitting: Internal Medicine

## 2020-05-11 ENCOUNTER — Other Ambulatory Visit: Payer: Self-pay

## 2020-05-11 ENCOUNTER — Ambulatory Visit (INDEPENDENT_AMBULATORY_CARE_PROVIDER_SITE_OTHER): Payer: Medicare Other | Admitting: Internal Medicine

## 2020-05-11 VITALS — BP 120/75 | HR 85 | Ht 74.0 in

## 2020-05-11 DIAGNOSIS — E1142 Type 2 diabetes mellitus with diabetic polyneuropathy: Secondary | ICD-10-CM

## 2020-05-11 DIAGNOSIS — E782 Mixed hyperlipidemia: Secondary | ICD-10-CM

## 2020-05-11 DIAGNOSIS — E669 Obesity, unspecified: Secondary | ICD-10-CM

## 2020-05-11 DIAGNOSIS — Z794 Long term (current) use of insulin: Secondary | ICD-10-CM | POA: Diagnosis not present

## 2020-05-11 MED ORDER — METFORMIN HCL 1000 MG PO TABS: 2000.0000 mg | ORAL_TABLET | Freq: Every day | ORAL | 3 refills | Status: AC

## 2020-05-11 NOTE — Patient Instructions (Addendum)
Please continue: - Toujeo 66 units at bedtime - NovoLog 20-25 units before meals - Metformin 2000 mg with dinner  Try to add fish oil 1000 mg 2x a day.  Please return in 4-6 months with your sugar log.

## 2020-05-11 NOTE — Progress Notes (Signed)
Patient ID: [name], male   DOB: 1951-01-05, 70 y.o.   MRN: [id number]  This visit occurred during the SARS-CoV-2 public health emergency.  Safety protocols were in place, including screening questions prior to the visit, additional usage of staff PPE, and extensive cleaning of exam room while observing appropriate contact time as indicated for disinfecting solutions.   HPI: [name] is a 70 y.o.-year-old male, returning for f/u for DM2, dx 2008, insulin-dependent since dx, uncontrolled, with complications (peripheral neuropathy, CAD-status post CABG in 2008, PVD, Charcot foot, history of diabetic foot ulcer, AKA bilaterally). Last visit 1 year ago. 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 Also Part D - [employer].   Interim hx: No increased urination, blurry vision, nausea. He has been having kidney stones >> drinks diet lemonade He is otherwise feeling well, without complaints.  Reviewed HbA1c: Lab Results  Component Value Date   HGBA1C 6.8 (H) 04/10/2020   HGBA1C 6.4 (A) 05/31/2019   HGBA1C 8.6 (H) 12/11/2018   Pt is on a regimen of: - Metformin 2000 mg with dinner - Toujeo 60 >> 66 >> 56 >> 66 units at bedtime - Humalog 25-30 >> Novolog  16-26 >> 25-30 >> 20-25 units before meals  Pt checks his sugars 1-3 times a day - am: 150-190 >> 87, 130-150 >> 110-140 > 132-190 >> 120-160 >> 117-130, 180 - before lunch: 69, 104-154, 207 >> n/c >> 56 >> n/c - before dinner: 120-150 >> 94-120s >> 120-160, 190 >> 130-135 - bedtime:  96-160 >> n/c >> 110-130 >> n/c >> 130-140 Lowest: 56 x1 (lunchtime) ... 94 >> 120. Hypogly awareness at 100.  Highest sugar: 290 >> 215 >> 180.  Patient meals are: - Breakfast: bacon, eggs, tomato sandwich (sometimes no bread) >> coffee + fruit - Lunch: PB crackers >> baked fish or chicken + veggies - Dinner: meat + 2 veggies  >> same as lunch - Snacks: 1 a day - 2x a week, icecream  >> fruit Uses Splenda in tea and coffee.  -+ CKD,  last BUN/creatinine:  Lab Results  Component Value Date   BUN 21 04/10/2020   CREATININE 1.03 04/10/2020  On losartan. -+ HL; last set of lipids: Lab Results  Component Value Date   CHOL 88 04/10/2020   HDL 19.40 (L) 04/10/2020   LDLCALC 52 02/19/2017   LDLDIRECT 38.0 04/10/2020   TRIG (H) 04/10/2020    473.0 Triglyceride is over 400; calculations on Lipids are invalid.   CHOLHDL 5 04/10/2020  He is statin intolerant. He had joint pain from from statins. - last eye exam was in 2020: No DR.  Dr. Claudean Kinds.  He had a detached retina in 03/2015 >> had Laser sx (his 3rd).  He had right cataract surgery 06/2015.  He had amputation of his big toe in 12/30/2012 2/2 infection >> osteomyelitis. He continued to have problems with ulcers on his left foot, in the setting of Charcot joint. He had a L BKA (02/23/2016) b/c he had a long h/o ulcer on his Charcot foot >> developed osteomyelitis.  Now prosthesis is off due to his ulcers.  He had L AKA 12/2017, R 5th ray amputation 12/2017.  Since last visit, he had right AKA 12/11/2018. Before this, he had infection and gangrene in his R leg.   He also has a history of prostate cancer , s/p 25 RxTx, then radioactive seeds.  He also has HTN, OSA, only.  ROS: Constitutional: no weight gain/no  weight loss, no fatigue, no subjective hyperthermia, no subjective hypothermia Eyes: no blurry vision, no xerophthalmia ENT: no sore throat, no nodules palpated in neck, no dysphagia, no odynophagia, no hoarseness Cardiovascular: no CP/no SOB/no palpitations/no leg swelling Respiratory: no cough/no SOB/no wheezing Gastrointestinal: no N/no V/no D/no C/no acid reflux Musculoskeletal: no muscle aches/no joint aches Skin: no rashes, no hair loss Neurological: no tremors/no numbness/no tingling/no dizziness  I reviewed pt's medications, allergies, PMH, social hx, family hx, and changes were documented in the history of present illness. Otherwise, unchanged from my  initial visit note.   Past Medical History:  Diagnosis Date  . Basal cell carcinoma 02/23/2019   nodular/infiltrative- right forearm-mohs  . Cellulitis and abscess of left lower extremity 12/20/2017  . Coronary atherosclerosis of unspecified type of vessel, native or graft   . Hemorrhage of rectum and anus   . History of kidney stones    passed stones  . Impotence of organic origin   . Internal hemorrhoids without mention of complication   . Malignant neoplasm of prostate ([place])   . Mononeuritis of unspecified site   . Osteoarthrosis, unspecified whether generalized or localized, unspecified site   . Other and unspecified hyperlipidemia   . Peripheral vascular disease (HCC)    B BKA  . Personal history of colonic polyps   . Personal history of gallstones   . Subacute osteomyelitis, right ankle and foot ([place])   . Type II or unspecified type diabetes mellitus with neurological manifestations, not stated as uncontrolled(250.60)   . Unspecified glaucoma(365.9)   . Unspecified sleep apnea    cpcp   Past Surgical History:  Procedure Laterality Date  . AMPUTATION Left 12/30/2012   Procedure: AMPUTATION RAY ;  Surgeon: Meredith Pel, MD;  Location: [facility];  Service: Orthopedics;  Laterality: Left;  LEFT GREAT TOE RAY AMPUTATION  . AMPUTATION Left 02/23/2016   Procedure: Left Below Knee Amputation;  Surgeon: [name], MD;  Location: [facility];  Service: Orthopedics;  Laterality: Left;  . AMPUTATION Left 12/24/2017   Procedure: LEFT ABOVE KNEE AMPUTATION;  Surgeon: [name], MD;  Location: [facility];  Service: Orthopedics;  Laterality: Left;  . AMPUTATION Right 12/24/2017   Procedure: RIGHT 5TH RAY AMPUTATION;  Surgeon: [name], MD;  Location: [facility];  Service: Orthopedics;  Laterality: Right;  . AMPUTATION Right 12/11/2018   Procedure: RIGHT ABOVE KNEE AMPUTATION;  Surgeon: [name], MD;  Location: [facility];  Service: Orthopedics;  Laterality: Right;  . APPENDECTOMY    .  BASAL CELL CARCINOMA EXCISION  2/16   left forearm  . BELPHAROPTOSIS REPAIR    . CARDIAC CATHETERIZATION  1998   Negative  . CATARACT EXTRACTION Right 2017   then lid surgery  . COLONOSCOPY    . COLONOSCOPY WITH PROPOFOL N/A 04/29/2019   Procedure: COLONOSCOPY WITH PROPOFOL;  Surgeon: Jackquline Denmark, MD;  Location: [facility];  Service: Endoscopy;  Laterality: N/A;  . CORONARY ARTERY BYPASS GRAFT  09/2005   5 bypasses per patient, Post op AFIB  . ESOPHAGOGASTRODUODENOSCOPY (EGD) WITH PROPOFOL N/A 04/29/2019   Procedure: ESOPHAGOGASTRODUODENOSCOPY (EGD) WITH PROPOFOL;  Surgeon: Jackquline Denmark, MD;  [facility];  Service: Endoscopy;  Laterality: N/A;  . EYE SURGERY    . FOOT BONE EXCISION Left 11/03/2013   DR DUDA   . I & D EXTREMITY Left 11/03/2013   Procedure: Left Foot Partial Bone Excision Cuboid and Medial Cuneiform, Wound Closures;  Surgeon: [name], MD;  Location: [facility];  Service: Orthopedics;  Laterality: Left;  . INSERTION PROSTATE RADIATION SEED  2009   RT and seeds for prostate cancer  . KIDNEY STONE SURGERY  04/1993  . RCA stents  04/2003   EF 55%  . RETINAL DETACHMENT SURGERY  2002-2003  . thrombosed vein  1993   Right leg   Social History   Socioeconomic History  . Marital status: Widowed    Spouse name: Not on file  . Number of children: 3  . Years of education: Not on file  . Highest education level: Not on file  Occupational History  . Occupation: Heavy equipment mechanic--retired  Tobacco Use  . Smoking status: Former Smoker    Packs/day: 2.00    Years: 35.00    Pack years: 70.00    Types: Cigars, Cigarettes    Quit date: 01/07/2001    Years since quitting: 19.3  . Smokeless tobacco: Never Used  Vaping Use  . Vaping Use: Never used  Substance and Sexual Activity  . Alcohol use: Not Currently    Comment: rare  . Drug use: No  . Sexual activity: Yes    Partners: Female  Other Topics Concern  . Not on file  Social History Narrative    Lives with girlfriend [name] since ~2005   Has living will   Daughter [name] is [facility] care POA    Would accept resuscitation attempts--but no prolonged artificial ventilation   No tube feeds if cognitively unaware   Social Determinants of Health   Financial Resource Strain: Not on file  Food Insecurity: Not on file  Transportation Needs: Not on file  Physical Activity: Not on file  Stress: Not on file  Social Connections: Not on file  Intimate Partner Violence: Not on file   Current Outpatient Medications on File Prior to Visit  Medication Sig Dispense Refill  . ACCU-CHEK GUIDE test strip USE AS DIRECTED TO CHECK BLOOD SUGAR 3 TIMES DAILY 300 strip 2  . carvedilol (COREG) 25 MG tablet TAKE 1 TABLET TWICE DAILY 180 tablet 3  . DROPLET PEN NEEDLES 31G X 5 MM MISC USE TO INJECT INSULIN FOUR TIMES DAILY AS INSTRUCTED (FOR DIABETES) 400 each 11  . insulin aspart (NOVOLOG FLEXPEN) 100 UNIT/ML FlexPen Inject 25-30 Units into the skin 3 (three) times daily with meals. Sliding Scale Insulin 25 pen 3  . insulin glargine, 2 Unit Dial, (TOUJEO MAX SOLOSTAR) 300 UNIT/ML Solostar Pen Inject 60 Units into the skin at bedtime. (Patient taking differently: Inject 66 Units into the skin at bedtime.) 9 pen 3  . losartan-hydrochlorothiazide (HYZAAR) 50-12.5 MG tablet TAKE 1 TABLET EVERY DAY 90 tablet 3  . metFORMIN (GLUCOPHAGE) 1000 MG tablet TAKE 1 TABLET EVERY DAY (Patient taking differently: 2,000 mg.) 30 tablet 0  . Multiple Vitamin (MULTIVITAMIN WITH MINERALS) TABS tablet Take 1 tablet by mouth daily. Centrum Silver for Men 50+    . mupirocin ointment (BACTROBAN) 2 % APPLY TO THE AFFECTED AREA TOPICALLY DAILY 22 g 1   No current facility-administered medications on file prior to visit.   Allergies  Allergen Reactions  . Atorvastatin Other (See Comments)    myalgias  . Ezetimibe Other (See Comments)    Body ache  . Simvastatin Other (See Comments)    Body ache   Family History   Problem Relation Age of Onset  . Lung cancer Father   . Multiple sclerosis Mother   . Heart attack Other  paternal aunts and uncles  . Peripheral vascular disease Maternal Grandfather        several amputations    PE: BP 120/75 (BP Location: Left Arm, Patient Position: Sitting, Cuff Size: Normal)   Pulse 85   Ht 6\' 2"  (1.88 m)   SpO2 95%   BMI 33.64 kg/m  Body mass index is 33.64 kg/m. Could not be weighed as he is in a wheelchair    Wt Readings from Last 3 Encounters:  05/06/19 262 lb (118.8 kg)  04/29/19 262 lb 12.6 oz (119.2 kg)  04/08/19 260 lb (117.9 kg)   Constitutional: overweight, in NAD, in wheelchair Eyes: PERRLA, EOMI, no exophthalmos ENT: moist mucous membranes, no thyromegaly, no cervical lymphadenopathy Cardiovascular: RRR, No MRG Respiratory: CTA B Gastrointestinal: abdomen soft, NT, ND, BS+ Musculoskeletal: + B AKA, strength intact in all 4 Skin: moist, warm, no rashes Neurological: no tremor with outstretched hands, DTR normal in all 4  ASSESSMENT: 1. DM2, insulin-dependent, uncontrolled, with complications - peripheral neuropathy - CAD-status post stent in 2005, then 5v CABG in 2008 - PVD - history of diabetic foot ulcer - Charcot foot - L - h/o amputation of his big toe in 12/2012 2/2 osteomyelitis and L BKA 01/2016; L AKA 12/2017, R 5th ray amputation 12/2017 - history of retinal detachment in 2002-3  2. Obesity class 2 BMI Classification:  < 18.5 underweight   18.5-24.9 normal weight   25.0-29.9 overweight   30.0-34.9 class I obesity   35.0-39.9 class II obesity   ? 40.0 class III obesity   3. HL  PLAN:  1. Patient with uncontrolled type 2 diabetes, basal-bolus insulin regimen and metformin.  His sugars improved after he started to eat a more plant-based diet and HbA1c decreased to 6.1%, however, he relaxed his diet afterwards and his sugars increased.  He now returns after long absence.  His sugars did not change much in the  last year, and per recall at this visit, they are all at or very close to goal with few exceptions.  He has no lows.  His highest blood sugar was 180 in the morning after he drank lemonade the night before, even though this was a diet drink.  He did change his Toujeo dose since last visit, taking a slightly higher dose of 66 units daily now, which we will continue.  He is also taking a slightly lower dose of NovoLog, 20 to 24 units, which also works well for him, so we will continue. -I reviewed his recent blood work ordered by Dr. [name].  HbA1c slightly higher, but still at goal, at 6.8% -I refilled his metformin to [facility] - I advised him to:  Patient Instructions  Please continue: - Toujeo 66 units at bedtime - NovoLog 20-25 units before meals - Metformin 2000 mg with dinner  Try to add fish oil 1000 mg 2x a day.  Please return in 4-6 months with your sugar log.    - advised to check sugars at different times of the day - 3-4x a day, rotating check times - advised for yearly eye exams >> he is not UTD - return to clinic in 4-6 months  2. Obesity class 2 -She initially lost 20 pounds on a more plant-based diet but then gained some of it back -We cannot weigh him now in the clinic due to having bilateral amputations  3. HL -Reviewed latest lipid panel from 04/2020: HDL low, LDL at goal, triglycerides high: Lab Results  Component Value Date  CHOL 88 04/10/2020   HDL 19.40 (L) 04/10/2020   LDLCALC 52 02/19/2017   LDLDIRECT 38.0 04/10/2020   TRIG (H) 04/10/2020    473.0 Triglyceride is over 400; calculations on Lipids are invalid.   CHOLHDL 5 04/10/2020  -He is intolerant to statins due to joint pains.  He tried to take these once a week with the same effect. -At this visit, suggested fish oil 1000 mg twice a day  Philemon Kingdom, MD PhD [facility]

## 2020-05-17 ENCOUNTER — Telehealth: Payer: Self-pay

## 2020-05-17 NOTE — Chronic Care Management (AMB) (Addendum)
    Chronic Care Management Pharmacy Assistant   Name: LATRELLE BAZAR  MRN: [id number] DOB: 05-Sep-1950  Reason for Encounter: CCM Reminder Call   Recent office visits:  04/10/2020  Dr.[name] PCP  Labs ordered,no medication changes f/u 1 year  Recent consult visits:  05/11/2020  [facility]  Changed to  Metformin 2000mg   at supper. Try to add fish oil 1000 mg 2x a day. Continue Toujeo 66 units bedtime and Novolog 20-25 units before meals.f/u 4-6 months  Hospital visits:  None in previous 6 months  Medications: Outpatient Encounter Medications as of 05/17/2020  Medication Sig   ACCU-CHEK GUIDE test strip USE AS DIRECTED TO CHECK BLOOD SUGAR 3 TIMES DAILY   carvedilol (COREG) 25 MG tablet TAKE 1 TABLET TWICE DAILY   DROPLET PEN NEEDLES 31G X 5 MM MISC USE TO INJECT INSULIN FOUR TIMES DAILY AS INSTRUCTED (FOR DIABETES)   insulin aspart (NOVOLOG FLEXPEN) 100 UNIT/ML FlexPen Inject 25-30 Units into the skin 3 (three) times daily with meals. Sliding Scale Insulin   insulin glargine, 2 Unit Dial, (TOUJEO MAX SOLOSTAR) 300 UNIT/ML Solostar Pen Inject 60 Units into the skin at bedtime. (Patient taking differently: Inject 66 Units into the skin at bedtime.)   losartan-hydrochlorothiazide (HYZAAR) 50-12.5 MG tablet TAKE 1 TABLET EVERY DAY   metFORMIN (GLUCOPHAGE) 1000 MG tablet Take 2 tablets (2,000 mg total) by mouth daily with supper.   Multiple Vitamin (MULTIVITAMIN WITH MINERALS) TABS tablet Take 1 tablet by mouth daily. Centrum Silver for Men 50+   mupirocin ointment (BACTROBAN) 2 % APPLY TO THE AFFECTED AREA TOPICALLY DAILY   No facility-administered encounter medications on file as of 05/17/2020.     [name] answered  the call to remind him of his upcoming telephone visit with [name] on 05/24/2020 at 2:00pm. The patient states he is not interested in the upcoming appointment with [name] CPP and wants to be removed from the list.  Star Rating  Drugs: Medication:   Last Fill: Day Supply Novalog 100 units/ml 08/21/2019 83ds toujeo 300/ml   11/29/2019 90ds Losartan 50-12.5  01/11/2020 90ds  Metformin 1000mg   01/07/2020 90ds   [name], CPP notified  Avel Sensor, [place] Assistant [phone]  I have reviewed the care management and care coordination activities outlined in this encounter and I am certifying that I agree with the content of this note. Appointment cancelled and patient unenrolled from CCM.  [name], PharmD Clinical Pharmacist [facility] [phone]

## 2020-05-24 ENCOUNTER — Telehealth: Payer: Medicare Other

## 2020-06-16 DIAGNOSIS — R338 Other retention of urine: Secondary | ICD-10-CM | POA: Diagnosis not present

## 2020-06-16 DIAGNOSIS — R102 Pelvic and perineal pain: Secondary | ICD-10-CM | POA: Diagnosis not present

## 2020-06-16 DIAGNOSIS — K8020 Calculus of gallbladder without cholecystitis without obstruction: Secondary | ICD-10-CM | POA: Diagnosis not present

## 2020-06-16 DIAGNOSIS — Z8546 Personal history of malignant neoplasm of prostate: Secondary | ICD-10-CM | POA: Diagnosis not present

## 2020-06-16 DIAGNOSIS — N200 Calculus of kidney: Secondary | ICD-10-CM | POA: Diagnosis not present

## 2020-06-16 DIAGNOSIS — N132 Hydronephrosis with renal and ureteral calculous obstruction: Secondary | ICD-10-CM | POA: Diagnosis not present

## 2020-06-20 ENCOUNTER — Other Ambulatory Visit: Payer: Self-pay | Admitting: Internal Medicine

## 2020-06-22 NOTE — Telephone Encounter (Signed)
Patient called re: Patient states he is almost out of the following medications and that [facility] told Patient they have sent 2 RX requests to Dr. [name] for the following with no response:  MEDICATION:  insulin aspart (NOVOLOG FLEXPEN) 100 UNIT/ML FlexPen  AND  insulin glargine, 2 Unit Dial, (TOUJEO MAX SOLOSTAR) 300 UNIT/ML Solostar Pen  PHARMACY:   [id number] [address], [place] - [place] Phone:  [phone]  Fax:  [phone]      HAS THE PATIENT CONTACTED THEIR PHARMACY?  Yes-Please see above  IS THIS A 90 DAY SUPPLY : Yes for both  IS PATIENT OUT OF MEDICATION: Almost  IF NOT; HOW MUCH IS LEFT: Approx. 5 days  LAST APPOINTMENT DATE: @5 /05/2020  NEXT APPOINTMENT DATE:@11 /10/2020  DO WE HAVE YOUR PERMISSION TO LEAVE A DETAILED MESSAGE?: Yes  OTHER COMMENTS:  Patient requests the RX(s) listed above be sent to [facility] listed asap   **Let patient know to contact pharmacy at the end of the day to make sure medication is ready. **  ** Please notify patient to allow 48-72 hours to process**  **Encourage patient to contact the pharmacy for refills or they can request refills through [facility]**

## 2020-06-23 DIAGNOSIS — R338 Other retention of urine: Secondary | ICD-10-CM | POA: Diagnosis not present

## 2020-06-23 DIAGNOSIS — N201 Calculus of ureter: Secondary | ICD-10-CM | POA: Diagnosis not present

## 2020-06-29 DIAGNOSIS — R311 Benign essential microscopic hematuria: Secondary | ICD-10-CM | POA: Diagnosis not present

## 2020-06-29 DIAGNOSIS — R338 Other retention of urine: Secondary | ICD-10-CM | POA: Diagnosis not present

## 2020-06-29 DIAGNOSIS — N3000 Acute cystitis without hematuria: Secondary | ICD-10-CM | POA: Diagnosis not present

## 2020-06-29 DIAGNOSIS — N201 Calculus of ureter: Secondary | ICD-10-CM | POA: Diagnosis not present

## 2020-07-10 ENCOUNTER — Other Ambulatory Visit: Payer: Self-pay | Admitting: Internal Medicine

## 2020-07-14 DIAGNOSIS — K8020 Calculus of gallbladder without cholecystitis without obstruction: Secondary | ICD-10-CM | POA: Diagnosis not present

## 2020-07-14 DIAGNOSIS — N201 Calculus of ureter: Secondary | ICD-10-CM | POA: Diagnosis not present

## 2020-07-14 DIAGNOSIS — K4090 Unilateral inguinal hernia, without obstruction or gangrene, not specified as recurrent: Secondary | ICD-10-CM | POA: Diagnosis not present

## 2020-07-14 DIAGNOSIS — N132 Hydronephrosis with renal and ureteral calculous obstruction: Secondary | ICD-10-CM | POA: Diagnosis not present

## 2020-07-14 DIAGNOSIS — K5730 Diverticulosis of large intestine without perforation or abscess without bleeding: Secondary | ICD-10-CM | POA: Diagnosis not present

## 2020-07-20 DIAGNOSIS — N201 Calculus of ureter: Secondary | ICD-10-CM | POA: Diagnosis not present

## 2020-07-25 ENCOUNTER — Other Ambulatory Visit: Payer: Self-pay | Admitting: Urology

## 2020-07-27 NOTE — Patient Instructions (Addendum)
DUE TO COVID-19 ONLY ONE VISITOR IS ALLOWED TO COME WITH YOU AND STAY IN THE WAITING ROOM ONLY DURING PRE OP AND PROCEDURE.   **NO VISITORS ARE ALLOWED IN THE SHORT STAY AREA OR RECOVERY ROOM!!**       Your procedure is scheduled on: 08/03/20   Report to [facility] Main  Entrance    Report to admitting at 11:30 AM   Call this number if you have problems the morning of surgery [phone]   Do not eat food :After Midnight.   May have liquids until 10:30 AM day of surgery  CLEAR LIQUID DIET  Foods Allowed                                                                     Foods Excluded  Water, Black Coffee and tea, regular and decaf               liquids that you cannot  Plain Jell-O in any flavor  (No red)                                    see through such as: Fruit ices (not with fruit pulp)                                             milk, soups, orange juice              Iced Popsicles (No red)                                                All solid food                                   Apple juices Sports drinks like Gatorade (No red) Lightly seasoned clear broth or consume(fat free) Sugar, honey syrup    Oral Hygiene is also important to reduce your risk of infection.                                    Remember - BRUSH YOUR TEETH THE MORNING OF SURGERY WITH YOUR REGULAR TOOTHPASTE   Take these medicines the morning of surgery with A SIP OF WATER: Carvedilol, Zofran.   DO NOT TAKE ANY ORAL DIABETIC MEDICATIONS DAY OF YOUR SURGERY  How to Manage Your Diabetes Before and After Surgery  Why is it important to control my blood sugar before and after surgery? Improving blood sugar levels before and after surgery helps healing and can limit problems. A way of improving blood sugar control is eating a healthy diet by:  Eating less sugar and carbohydrates  Increasing activity/exercise  Talking with your doctor about reaching your blood sugar goals High blood  sugars (greater than 180 mg/dL) can raise your  risk of infections and slow your recovery, so you will need to focus on controlling your diabetes during the weeks before surgery. Make sure that the doctor who takes care of your diabetes knows about your planned surgery including the date and location.  How do I manage my blood sugar before surgery? Check your blood sugar at least 4 times a day, starting 2 days before surgery, to make sure that the level is not too high or low. Check your blood sugar the morning of your surgery when you wake up and every 2 hours until you get to the Short Stay unit. If your blood sugar is less than 70 mg/dL, you will need to treat for low blood sugar: Do not take insulin. Treat a low blood sugar (less than 70 mg/dL) with  cup of clear juice (cranberry or apple), 4 glucose tablets, OR glucose gel. Recheck blood sugar in 15 minutes after treatment (to make sure it is greater than 70 mg/dL). If your blood sugar is not greater than 70 mg/dL on recheck, call [phone] for further instructions. Report your blood sugar to the short stay nurse when you get to Short Stay.  If you are admitted to the hospital after surgery: Your blood sugar will be checked by the staff and you will probably be given insulin after surgery (instead of oral diabetes medicines) to make sure you have good blood sugar levels. The goal for blood sugar control after surgery is 80-180 mg/dL.   WHAT DO I DO ABOUT MY DIABETES MEDICATION?  Do not take oral diabetes medicines (pills) the morning of surgery.  THE DAY BEFORE SURGERY, take Metformin as prescribed. Take 33 units of insulin glargine at bedtime. Take normal dose of Novolog with meals, no bedtime dose.     THE MORNING OF SURGERY, do not take Metformin or Novolog. Only take Novolog is blood sugar is greater that 220, then take half of normal dose.  If your CBG is greater than 220 mg/dL, you may take  of your sliding scale   (correction) dose of insulin.    Reviewed and Endorsed by [facility] Patient Education Committee, August 2015                               You may not have any metal on your body including hair pins, jewelry, and body piercing             Do not wear lotions, powders, cologne, or deodorant              Men may shave face and neck.   Do not bring valuables to the hospital. [place].   Contacts, dentures or bridgework may not be worn into surgery.    Patients discharged the day of surgery will not be allowed to drive home.  Special Instructions: Bring a copy of your healthcare power of attorney and living will documents         the day of surgery if you haven't scanned them in before.   Please read over the following fact sheets you were given: IF YOU HAVE QUESTIONS ABOUT YOUR PRE OP INSTRUCTIONS PLEASE CALL [phone]- [place] - Preparing for Surgery Before surgery, you can play an important role.  Because skin is not sterile, your skin needs to be as  free of germs as possible.  You can reduce the number of germs on your skin by washing with CHG (chlorahexidine gluconate) soap before surgery.  CHG is an antiseptic cleaner which kills germs and bonds with the skin to continue killing germs even after washing. Please DO NOT use if you have an allergy to CHG or antibacterial soaps.  If your skin becomes reddened/irritated stop using the CHG and inform your nurse when you arrive at Short Stay. Do not shave (including legs and underarms) for at least 48 hours prior to the first CHG shower.  You may shave your face/neck.  Please follow these instructions carefully:  1.  Shower with CHG Soap the night before surgery and the  morning of surgery.  2.  If you choose to wash your hair, wash your hair first as usual with your normal  shampoo.  3.  After you shampoo, rinse your hair and body thoroughly to remove the shampoo.                              4.  Use CHG as you would any other liquid soap.  You can apply chg directly to the skin and wash.  Gently with a scrungie or clean washcloth.  5.  Apply the CHG Soap to your body ONLY FROM THE NECK DOWN.   Do   not use on face/ open                           Wound or open sores. Avoid contact with eyes, ears mouth and   genitals (private parts).                       Wash face,  Genitals (private parts) with your normal soap.             6.  Wash thoroughly, paying special attention to the area where your    surgery  will be performed.  7.  Thoroughly rinse your body with warm water from the neck down.  8.  DO NOT shower/wash with your normal soap after using and rinsing off the CHG Soap.                9.  Pat yourself dry with a clean towel.            10.  Wear clean pajamas.            11.  Place clean sheets on your bed the night of your first shower and do not  sleep with pets. Day of Surgery : Do not apply any lotions/deodorants the morning of surgery.  Please wear clean clothes to the hospital/surgery center.  FAILURE TO FOLLOW THESE INSTRUCTIONS MAY RESULT IN THE CANCELLATION OF YOUR SURGERY  PATIENT SIGNATURE_________________________________  NURSE SIGNATURE__________________________________  ________________________________________________________________________

## 2020-07-27 NOTE — Progress Notes (Addendum)
COVID Vaccine Completed: yes x3 Date COVID Vaccine completed: 02/21/19, 03/16/19 Has received booster: COVID vaccine manufacturer: Pfizer    Date of COVID positive in last 90 days: N/A  PCP - Viviana Simpler, MD Cardiologist -   Chest x-ray - N/A EKG - 07/28/20 Epic Stress Test - greater than 10 years ECHO -  Cardiac Cath - yes long time ago. X2 stents and bypass Pacemaker/ICD device last checked:n/a Spinal Cord Stimulator:N/A  Sleep Study - positive CPAP - yes every night  Fasting Blood Sugar - 96-110 Checks Blood Sugar __1___ times a day  Blood Thinner Instructions: Aspirin Instructions: ASA 81mg , stop 1 week before surgery. Last Dose: 07/26/20  Activity level: Patient is wheelchair bound  due to bilateral AKA. Denies any SOB or chest pain with completing ADLs.     Anesthesia review: DM, HTN, OSA, coronary atherosclerosis, bilateral AKA  Patient denies shortness of breath, fever, cough and chest pain at PAT appointment   Patient verbalized understanding of instructions that were given to them at the PAT appointment. Patient was also instructed that they will need to review over the PAT instructions again at home before surgery.

## 2020-07-28 ENCOUNTER — Encounter (HOSPITAL_COMMUNITY): Payer: Self-pay

## 2020-07-28 ENCOUNTER — Encounter (HOSPITAL_COMMUNITY)
Admission: RE | Admit: 2020-07-28 | Discharge: 2020-07-28 | Disposition: A | Discharge status: Home / Self Care | Payer: Medicare Other | Source: Ambulatory Visit | Attending: Urology | Admitting: Urology

## 2020-07-28 ENCOUNTER — Other Ambulatory Visit: Payer: Self-pay

## 2020-07-28 DIAGNOSIS — Z01818 Encounter for other preprocedural examination: Secondary | ICD-10-CM | POA: Insufficient documentation

## 2020-07-28 HISTORY — DX: Essential (primary) hypertension: I10

## 2020-07-28 LAB — CBC
HCT: 46.1 % (ref 39.0–52.0)
Hemoglobin: 14.7 g/dL (ref 13.0–17.0)
MCH: 24.7 pg — ABNORMAL LOW (ref 26.0–34.0)
MCHC: 31.9 g/dL (ref 30.0–36.0)
MCV: 77.5 fL — ABNORMAL LOW (ref 80.0–100.0)
Platelets: 259 10*3/uL (ref 150–400)
RBC: 5.95 MIL/uL — ABNORMAL HIGH (ref 4.22–5.81)
RDW: 22.5 % — ABNORMAL HIGH (ref 11.5–15.5)
WBC: 11.4 10*3/uL — ABNORMAL HIGH (ref 4.0–10.5)
nRBC: 1 % — ABNORMAL HIGH (ref 0.0–0.2)

## 2020-07-28 LAB — BASIC METABOLIC PANEL
Anion gap: 7 (ref 5–15)
BUN: 28 mg/dL — ABNORMAL HIGH (ref 8–23)
CO2: 24 mmol/L (ref 22–32)
Calcium: 9 mg/dL (ref 8.9–10.3)
Chloride: 107 mmol/L (ref 98–111)
Creatinine, Ser: 1.59 mg/dL — ABNORMAL HIGH (ref 0.61–1.24)
GFR, Estimated: 46 mL/min — ABNORMAL LOW (ref >60)
Glucose, Bld: 103 mg/dL — ABNORMAL HIGH (ref 70–99)
Potassium: 4.8 mmol/L (ref 3.5–5.1)
Sodium: 138 mmol/L (ref 135–145)

## 2020-07-28 LAB — GLUCOSE, CAPILLARY: Glucose-Capillary: 111 mg/dL — ABNORMAL HIGH (ref 70–99)

## 2020-07-29 LAB — HEMOGLOBIN A1C
Hgb A1c MFr Bld: 6.1 % — ABNORMAL HIGH (ref 4.8–5.6)
Mean Plasma Glucose: 128 mg/dL

## 2020-08-01 NOTE — Progress Notes (Addendum)
Anesthesia Chart Review   Case: [id number] Date/Time: 08/03/20 1316   Procedure: CYSTOSCOPY/ RETROGRADE/URETEROSCOPY/HOLMIUM LASER/STENT PLACEMENT (Left)   Anesthesia type: General   Pre-op diagnosis: LEFT URETERAL CALCULUS   Location: [facility] PROCEDURE [facility] / [facility]   Surgeons: Raynelle Bring, MD       DISCUSSION:70 y.o. former smoker with h/o HTN, DM II, sleep apnea with cpap, PVD s/p bilateral BKA, CAD (stent 2005, CABG 2008), prostate cancer s/p radioactive see implant (Dr. Alinda Money),  left ureteral calculus scheduled for above procedure 08/03/2020 with Dr. Raynelle Bring.   Previously saw cardiologist Dr. Johnsie Cancel, but no records seen since 2008.  He has followed with internal medicine since that time, last seen by Dr. [name] 04/10/20.   S/p right AKA 12/11/2018, left AKA 12/24/2017 with no anesthesia complications.  Discussed with Dr. Deatra Canter. Pt will be evaluated DOS by anesthesia.  VS: BP (!) 164/79   Pulse 83   Temp 36.6 C (Oral)   Resp 16   Wt 113.4 kg   SpO2 96%   BMI 32.10 kg/m   PROVIDERS: [name], MD is PCP last seen 04/10/2020   LABS: Labs reviewed: Acceptable for surgery. (all labs ordered are listed, but only abnormal results are displayed)  Labs Reviewed  BASIC METABOLIC PANEL - Abnormal; Notable for the following components:      Result Value   Glucose, Bld 103 (*)    BUN 28 (*)    Creatinine, Ser 1.59 (*)    GFR, Estimated 46 (*)    All other components within normal limits  HEMOGLOBIN A1C - Abnormal; Notable for the following components:   Hgb A1c MFr Bld 6.1 (*)    All other components within normal limits  CBC - Abnormal; Notable for the following components:   WBC 11.4 (*)    RBC 5.95 (*)    MCV 77.5 (*)    MCH 24.7 (*)    RDW 22.5 (*)    nRBC 1.0 (*)    All other components within normal limits  GLUCOSE, CAPILLARY - Abnormal; Notable for the following components:   Glucose-Capillary 111 (*)    All other components within normal limits      IMAGES:   EKG: 07/28/20 Rate 83 bpm  Normal sinus rhythm Normal ECG  CV:  Past Medical History:  Diagnosis Date   Basal cell carcinoma 02/23/2019   nodular/infiltrative- right forearm-mohs   Cellulitis and abscess of left lower extremity 12/20/2017   Coronary atherosclerosis of unspecified type of vessel, native or graft    Hemorrhage of rectum and anus    History of kidney stones    passed stones   Hypertension    Impotence of organic origin    Internal hemorrhoids without mention of complication    Malignant neoplasm of prostate (HCC)    Mononeuritis of unspecified site    Osteoarthrosis, unspecified whether generalized or localized, unspecified site    Other and unspecified hyperlipidemia    Peripheral vascular disease ([place])    B BKA   Personal history of colonic polyps    Personal history of gallstones    Subacute osteomyelitis, right ankle and foot ([place])    Type II or unspecified type diabetes mellitus with neurological manifestations, not stated as uncontrolled(250.60)    Unspecified glaucoma(365.9)    Unspecified sleep apnea    cpcp    Past Surgical History:  Procedure Laterality Date   AMPUTATION Left 12/30/2012   Procedure: AMPUTATION RAY ;  Surgeon: Meredith Pel,  MD;  Location: [facility];  Service: Orthopedics;  Laterality: Left;  LEFT GREAT TOE RAY AMPUTATION   AMPUTATION Left 02/23/2016   Procedure: Left Below Knee Amputation;  Surgeon: [name], MD;  Location: [place];  Service: Orthopedics;  Laterality: Left;   AMPUTATION Left 12/24/2017   Procedure: LEFT ABOVE KNEE AMPUTATION;  Surgeon: [name], MD;  Location: [place];  Service: Orthopedics;  Laterality: Left;   AMPUTATION Right 12/24/2017   Procedure: RIGHT 5TH RAY AMPUTATION;  Surgeon: [name], MD;  Location: [place];  Service: Orthopedics;  Laterality: Right;   AMPUTATION Right 12/11/2018   Procedure: RIGHT ABOVE KNEE AMPUTATION;  Surgeon: [name], MD;  Location: [place];   Service: Orthopedics;  Laterality: Right;   APPENDECTOMY     BASAL CELL CARCINOMA EXCISION  2/16   left forearm   [place]   Negative   CATARACT EXTRACTION Right 2017   then lid surgery   COLONOSCOPY     COLONOSCOPY WITH PROPOFOL N/A 04/29/2019   Procedure: COLONOSCOPY WITH PROPOFOL;  Surgeon: Jackquline Denmark, MD;  Location: [facility] ENDOSCOPY;  Service: Endoscopy;  Laterality: N/A;   CORONARY ARTERY BYPASS GRAFT  09/2005   5 bypasses per patient, Post op AFIB   ESOPHAGOGASTRODUODENOSCOPY (EGD) WITH PROPOFOL N/A 04/29/2019   Procedure: ESOPHAGOGASTRODUODENOSCOPY (EGD) WITH PROPOFOL;  Surgeon: Jackquline Denmark, MD;  Location: [facility] ENDOSCOPY;  Service: Endoscopy;  Laterality: N/A;   EYE SURGERY     FOOT BONE EXCISION Left 11/03/2013   DR DUDA    I & D EXTREMITY Left 11/03/2013   Procedure: Left Foot Partial Bone Excision Cuboid and Medial Cuneiform, Wound Closures;  Surgeon: [name], MD;  Location: [place];  Service: Orthopedics;  Laterality: Left;   INSERTION PROSTATE RADIATION SEED  2009   RT and seeds for prostate cancer   KIDNEY STONE SURGERY  04/1993   RCA stents  04/2003   EF 55%   RETINAL DETACHMENT SURGERY  2002-2003   thrombosed vein  1993   Right leg    MEDICATIONS:  ACCU-CHEK GUIDE test strip   aspirin EC 81 MG tablet   carvedilol (COREG) 25 MG tablet   DROPLET PEN NEEDLES 31G X 5 MM MISC   insulin glargine, 2 Unit Dial, (TOUJEO MAX SOLOSTAR) 300 UNIT/ML Solostar Pen   losartan-hydrochlorothiazide (HYZAAR) 50-12.5 MG tablet   metFORMIN (GLUCOPHAGE) 1000 MG tablet   Multiple Vitamin (MULTIVITAMIN WITH MINERALS) TABS tablet   mupirocin ointment (BACTROBAN) 2 %   NOVOLOG FLEXPEN 100 UNIT/ML FlexPen   ondansetron (ZOFRAN) 4 MG tablet   oxyCODONE-acetaminophen (PERCOCET/ROXICET) 5-325 MG tablet   tamsulosin (FLOMAX) 0.4 MG CAPS capsule   No current facility-administered medications for this encounter.     Konrad Felix, PA-C [facility]  Pre-Surgical Testing [phone]

## 2020-08-02 NOTE — H&P (Signed)
Office Visit Report 07/14/2020    [name]         MRN: [id number]  PRIMARY CARE:  [name], MD    REFERRING:  Daine Gravel, NP    PROVIDER:  Raynelle Bring, M.D.  DOB: Feb 15, 1950, 70 year old Male  TREATING:  Daine Gravel, NP  SSN: -**[id number]  LOCATION:  [facility] [phone]    CC/HPI: 1. Prostate cancer  2. Uric acid urolithiasis  3. BPH/LUTS   cc: Urolithiasis   HPI: 70 year old male who presents today for renal US. He underwent CT imaging one month ago which showed multiple distal left sided stones causing moderate hydro. He was seen in our office at that time by Dr. Alyson Ingles who placed a Foley catheter due to urinary complaints of retention and weak slow stream. He was also placed on tamsulosin. He passed multiple stone fragments into his Foley catheter bag and passed a voiding trial. His follow-up ultrasound his hydronephrosis appeared to be resolving. He presents today for follow-up ultrasound which does show worsening left-sided hydronephrosis. He reports that he has had some worse left-sided discomfort and also some intermittent gross hematuria. He denies trouble voiding. He is emptying his bladder well. He denies fevers and chills.     ALLERGIES: Statins - Other Reaction, myalgia    MEDICATIONS: Flomax 0.4 mg capsule 1 capsule PO Daily  Aspirin Ec 81 mg tablet, delayed release  Carvedilol 25 mg tablet Oral  Clotrimazole-Betamethasone 1 %-0.05 % cream Apply thin layer twice daily to affected area for two weeks.  Hyzaar 50 mg-12.5 mg tablet Oral  Metformin Hcl 1,000 mg tablet Oral  Mupirocin 2 % cream External  Novolog 1 PO Daily  Silver Sulfadiazine 1 % External Cream External  Toujeo Solostar 300 unit/ml (1.5 ml) insulin pen     GU PSH: Locm 300-'399Mg'$ /Ml Iodine,1Ml - 2018 TRANSPERI NEEDLE PLACE, PROS - 2009        PSH Notes: eye surgery- left lid x3    NON-GU PSH: Amputate Foot At Ankle, Left Appendectomy - 2009 CABG (coronary  artery bypass grafting) - 2009 Cardiac Stent Placement Cataract Surgery.., Right Leg amputation, Left - 2018 Repair Detached Retina, Right - 2017 Toe amputation, Left           GU PMH: Acute Cystitis/UTI - 06/29/2020 Ureteral calculus - 06/29/2020, (Stable), - 06/23/2020, Ureteral stone, - 2016 Urinary Retention - 06/29/2020, - 06/23/2020, - 06/16/2020 History of urolithiasis - 2018 Urinary Retention, Unspec (Acute), Instructed to push fluids and if unable to void in 6 hrs RTC for PVR and possible foley replacement. If he fails TOV will need to proceed with UDS and f/u cysto w/Dr. Alinda Money. Will continue with daily Tamsulosin and f/u in 24 hrs for PVR. Will send stones for analysis. Will have pt RTC for CT abd/pelvis to evaluate bilateral renal stones and to r/o possible bladder stone. - 2018 Balanitis (Stable) - 2018, - 2018 Microscopic hematuria - 2018 Other microscopic hematuria, Microscopic hematuria - 2016 Prostate Cancer, Prostate cancer - 2016 ED due to arterial insufficiency, Erectile dysfunction due to arterial insufficiency - 2014 Renal calculus       PMH Notes:   1) Prostate cancer: He was diagnosed in April 2009 and is s/p treatment with combination external beam radiation and seed implantation in conjunction with short term androgen deprivation. He completed therapy in October 2009. His PSA reached a nadir of undetectable in June 2010.   TNM stage: cT2a N0/1 M0  Gleason  score: 4+3=7  PSA: 7.6  Prostate volume: 44 cc  Staging studies: CT scan (05/19/07): 1 cm lymph nodes in the external iliac chains bilaterally, Bone scan (05/19/07): uptake at T7 and T9 that are most likely degenerative but could potentially represent metastases  Pretreatment urinary function: He had no baseline symptoms. IPSS: 0/0.  Pretreatment SHIM: 9.   2) Urolithiasis: He developed recurrent urolithiasis in 2015-2016. He was noted to have primarily uric acid stones.   Current treatment: K citra  Prior  treatment: Potassium citrate (questionable GI symptoms)   3) Hematuria: He has a history of microscopic hematuria. He has a history of tobacco use and pelvic radiation.   Sep 2016: CT - multiple bilateral non-obstructing stones, Cysto - unremarkable   ** He has a history of coronary artery disease and has undergone placement of 2 stents in the past which subsequently occluded. He subsequently has undergone a five-vessel coronary artery bypass grafting in 2006. His cardiologist is Dr. Jenkins Rouge.   2007-04-15 14:02:30 - Note: Arthritis    NON-GU PMH: Personal history of other diseases of the nervous system and sense organs, History of sleep apnea - 2014, History of glaucoma, - 2014 Coronary Artery Disease Diabetes Type 2 Hypercholesterolemia Hypertension     FAMILY HISTORY: Breast Cancer - Sister Lung Cancer - Father multiple sclerosis - Mother Prostate Cancer - No Family History    SOCIAL HISTORY: Marital Status: Widowed Preferred Language: English; Ethnicity: Not Hispanic Or Latino; Race: White Current Smoking Status: Patient does not smoke anymore.  Does not use smokeless tobacco. Types of alcohol consumed: Wine. Social Drinker.  Does not use drugs. Does not drink caffeine. Has not had a blood transfusion. Patient's occupation is/was retired.     REVIEW OF SYSTEMS:      GU Review Male:  Patient denies frequent urination, hard to postpone urination, burning/ pain with urination, get up at night to urinate, leakage of urine, stream starts and stops, trouble starting your stream, have to strain to urinate , erection problems, and penile pain.     Gastrointestinal (Upper):  Patient denies nausea, vomiting, and indigestion/ heartburn.     Gastrointestinal (Lower):  Patient denies diarrhea and constipation.     Constitutional:  Patient denies fever, night sweats, weight loss, and fatigue.     Skin:  Patient denies skin rash/ lesion and itching.     Eyes:  Patient denies blurred  vision and double vision.     Ears/ Nose/ Throat:  Patient denies sore throat and sinus problems.     Hematologic/Lymphatic:  Patient denies swollen glands and easy bruising.     Cardiovascular:  Patient denies leg swelling and chest pains.     Respiratory:  Patient denies cough and shortness of breath.     Endocrine:  Patient denies excessive thirst.     Musculoskeletal:  Patient denies back pain and joint pain.     Neurological:  Patient denies headaches and dizziness.     Psychologic:  Patient denies anxiety and depression.     VITAL SIGNS: None      GU PHYSICAL EXAMINATION:       Penis: Penis uncircumcised. No foreskin warts, no cracks. No dorsal peyronie's plaques, no left corporal peyronie's plaques, no right corporal peyronie's plaques, no scarring, no shaft warts. No balanitis, no meatal stenosis.       MULTI-SYSTEM PHYSICAL EXAMINATION:       Constitutional: Obese. Moderate physical deformities. Normally developed. Good grooming.  Respiratory: No labored breathing, no use of accessory muscles.       Cardiovascular: Normal temperature, normal extremity pulses, no swelling, no varicosities.      Skin: No paleness, no jaundice, no cyanosis. No lesion, no ulcer, no rash.      Neurologic / Psychiatric: Oriented to time, oriented to place, oriented to person. No depression, no anxiety, no agitation.      Gastrointestinal: No mass, no tenderness, no rigidity, non obese abdomen.      Notes: Bilateral above-the-knee amputee. Uses a wheelchair.              Complexity of Data:   Source Of History:  Patient  Records Review:  Previous Doctor Records, Previous Hospital Records, Previous Patient Records  Urine Test Review:  Urinalysis, Urine Culture  X-Ray Review: Renal Ultrasound: Reviewed Films.  C.T. Stone Protocol: Reviewed Films. Reviewed Report.      07/18/17 07/17/16 07/19/15 07/12/14 06/26/13 06/18/12 12/18/11 06/14/11  PSA  Total PSA <0.015 ng/mL 0.021 ng/dL 0.01  0.02   0.02  0.03  0.04  0.05     10/23/07  Hormones  Testosterone, Total <20.0     07/14/20  Urinalysis  Urine Appearance Cloudy   Urine Color Yellow   Urine Glucose Neg mg/dL  Urine Bilirubin Neg mg/dL  Urine Ketones Neg mg/dL  Urine Specific Gravity 1.015   Urine Blood 3+ ery/uL  Urine pH 5.5   Urine Protein 2+ mg/dL  Urine Urobilinogen 0.2 mg/dL  Urine Nitrites Positive   Urine Leukocyte Esterase 3+ leu/uL  Urine WBC/hpf 40 - 60/hpf   Urine RBC/hpf 20 - 40/hpf   Urine Epithelial Cells NS (Not Seen)   Urine Bacteria Many (>50/hpf)   Urine Mucous Not Present   Urine Yeast NS (Not Seen)   Urine Trichomonas Not Present   Urine Cystals NS (Not Seen)   Urine Casts NS (Not Seen)   Urine Sperm Not Present    PROCEDURES:    C.T. Urogram - [id number]      Patient confirmed No Neulasta OnPro Device.     Renal Ultrasound (Limited) [id number]  Kidney: Left Length: 15.3 cm Depth: 8.3 cm Cortical Width: 1.7 cm Width: 7.9 cm    Left Kidney/Ureter:  Increase of hydro since previous US. Multiple calcifications.  Bladder:  PVR 12.4 ml      Patient confirmed No Neulasta OnPro Device.      Urinalysis w/Scope - 81001  Dipstick Dipstick Cont'd Micro  Color: Yellow Bilirubin: Neg WBC/hpf: 40 - 60/hpf  Appearance: Cloudy Ketones: Neg RBC/hpf: 20 - 40/hpf  Specific Gravity: 1.015 Blood: 3+ Bacteria: Many (>50/hpf)  pH: 5.5 Protein: 2+ Cystals: NS (Not Seen)  Glucose: Neg Urobilinogen: 0.2 Casts: NS (Not Seen)   Nitrites: Positive Trichomonas: Not Present   Leukocyte Esterase: 3+ Mucous: Not Present    Epithelial Cells: NS (Not Seen)    Yeast: NS (Not Seen)    Sperm: Not Present   Notes:       ASSESSMENT:     ICD-10 Details  1 GU:  Ureteral calculus - N20.1 Left, Acute, Systemic Symptoms   PLAN:   Medications  New Meds: Augmentin 500 mg-125 mg tablet 1 tablet PO BID #20 0 Refill(s)    Orders  Labs CULTURE, URINE  X-Rays: C.T. Stone Protocol Without I.V. Contrast  X-Ray  Notes: History:  Hematuria: Yes/No  Patient to see MD after exam: Yes/No  Previous exam: CT / IVP/ US/ KUB/ None  When:  Where:  Diabetic: Yes/ No  BUN/ Creatine:  Date of last BUN Creatinine:  Weight in pounds:  Allergy- Contrasts/ Shellfish: Yes/ No  Conflicting diabetic meds: Yes/ No  Oral contrast and instructions given to patient:   Prior Authorization #: NPCR    Schedule    Document  Letter(s):  Created for Patient: Clinical Summary   Notes:  1. Ureteral Calculus: Renal ultrasound was concerning for redevelopment of left-sided hydronephrosis and therefore CT scan imaging was obtained. CT scan does show a 10 mm obstructing proximal calculus. Previously noted distal left sided calculus are not evident on today's CT. I will discuss this further with his urologist to see if ureteroscopy is indicated. The patient was given very strict return precautions for any worsening symptomatology. Final radiology read is pending. I will follow up with him in the next few days.   * Signed by Daine Gravel, NP on 07/16/20 at 11:22 AM (EDT)*    APPENDED NOTES:    I reviewed his KUB with him the demonstrates a radiolucent stone consistent with a uric acid stone. After reviewing options, he is amenable to proceeding with cystoscopy, left retrograde pyelography, left ureteroscopic laser lithotripsy, and left ureteral stent placement. The potential risks, complications, and expected recovery process have been discussed in detail. He gives informed consent to proceed. We will then discuss pH manipulation for further treatment/prevention.   * Signed by Raynelle Bring, M.D. on 07/21/20 at 6:11 PM (EDT)*

## 2020-08-02 NOTE — H&P (View-Only) (Deleted)
Office Visit Report 07/14/2020    [name]         MRN: [id number]  PRIMARY CARE:  [name], MD    REFERRING:  Daine Gravel, NP    PROVIDER:  Raynelle Bring, M.D.  DOB: Jun 10, 1950, 70 year old Male  TREATING:  Daine Gravel, NP  SSN: -**[id number]  LOCATION:  [facility] [phone]    CC/HPI: 1. Prostate cancer  2. Uric acid urolithiasis  3. BPH/LUTS   cc: Urolithiasis   HPI: 70 year old male who presents today for renal US. He underwent CT imaging one month ago which showed multiple distal left sided stones causing moderate hydro. He was seen in our office at that time by Dr. Alyson Ingles who placed a Foley catheter due to urinary complaints of retention and weak slow stream. He was also placed on tamsulosin. He passed multiple stone fragments into his Foley catheter bag and passed a voiding trial. His follow-up ultrasound his hydronephrosis appeared to be resolving. He presents today for follow-up ultrasound which does show worsening left-sided hydronephrosis. He reports that he has had some worse left-sided discomfort and also some intermittent gross hematuria. He denies trouble voiding. He is emptying his bladder well. He denies fevers and chills.     ALLERGIES: Statins - Other Reaction, myalgia    MEDICATIONS: Flomax 0.4 mg capsule 1 capsule PO Daily  Aspirin Ec 81 mg tablet, delayed release  Carvedilol 25 mg tablet Oral  Clotrimazole-Betamethasone 1 %-0.05 % cream Apply thin layer twice daily to affected area for two weeks.  Hyzaar 50 mg-12.5 mg tablet Oral  Metformin Hcl 1,000 mg tablet Oral  Mupirocin 2 % cream External  Novolog 1 PO Daily  Silver Sulfadiazine 1 % External Cream External  Toujeo Solostar 300 unit/ml (1.5 ml) insulin pen     GU PSH: Locm 300-'399Mg'$ /Ml Iodine,1Ml - 2018 TRANSPERI NEEDLE PLACE, PROS - 2009        PSH Notes: eye surgery- left lid x3    NON-GU PSH: Amputate Foot At Ankle, Left Appendectomy - 2009 CABG (coronary  artery bypass grafting) - 2009 Cardiac Stent Placement Cataract Surgery.., Right Leg amputation, Left - 2018 Repair Detached Retina, Right - 2017 Toe amputation, Left           GU PMH: Acute Cystitis/UTI - 06/29/2020 Ureteral calculus - 06/29/2020, (Stable), - 06/23/2020, Ureteral stone, - 2016 Urinary Retention - 06/29/2020, - 06/23/2020, - 06/16/2020 History of urolithiasis - 2018 Urinary Retention, Unspec (Acute), Instructed to push fluids and if unable to void in 6 hrs RTC for PVR and possible foley replacement. If he fails TOV will need to proceed with UDS and f/u cysto w/Dr. Alinda Money. Will continue with daily Tamsulosin and f/u in 24 hrs for PVR. Will send stones for analysis. Will have pt RTC for CT abd/pelvis to evaluate bilateral renal stones and to r/o possible bladder stone. - 2018 Balanitis (Stable) - 2018, - 2018 Microscopic hematuria - 2018 Other microscopic hematuria, Microscopic hematuria - 2016 Prostate Cancer, Prostate cancer - 2016 ED due to arterial insufficiency, Erectile dysfunction due to arterial insufficiency - 2014 Renal calculus       PMH Notes:   1) Prostate cancer: He was diagnosed in April 2009 and is s/p treatment with combination external beam radiation and seed implantation in conjunction with short term androgen deprivation. He completed therapy in October 2009. His PSA reached a nadir of undetectable in June 2010.   TNM stage: cT2a N0/1 M0  Gleason  score: 4+3=7  PSA: 7.6  Prostate volume: 44 cc  Staging studies: CT scan (05/19/07): 1 cm lymph nodes in the external iliac chains bilaterally, Bone scan (05/19/07): uptake at T7 and T9 that are most likely degenerative but could potentially represent metastases  Pretreatment urinary function: He had no baseline symptoms. IPSS: 0/0.  Pretreatment SHIM: 9.   2) Urolithiasis: He developed recurrent urolithiasis in 2015-2016. He was noted to have primarily uric acid stones.   Current treatment: K citra  Prior  treatment: Potassium citrate (questionable GI symptoms)   3) Hematuria: He has a history of microscopic hematuria. He has a history of tobacco use and pelvic radiation.   Sep 2016: CT - multiple bilateral non-obstructing stones, Cysto - unremarkable   ** He has a history of coronary artery disease and has undergone placement of 2 stents in the past which subsequently occluded. He subsequently has undergone a five-vessel coronary artery bypass grafting in 2006. His cardiologist is Dr. Jenkins Rouge.   2007-04-15 14:02:30 - Note: Arthritis    NON-GU PMH: Personal history of other diseases of the nervous system and sense organs, History of sleep apnea - 2014, History of glaucoma, - 2014 Coronary Artery Disease Diabetes Type 2 Hypercholesterolemia Hypertension     FAMILY HISTORY: Breast Cancer - Sister Lung Cancer - Father multiple sclerosis - Mother Prostate Cancer - No Family History    SOCIAL HISTORY: Marital Status: Widowed Preferred Language: English; Ethnicity: Not Hispanic Or Latino; Race: White Current Smoking Status: Patient does not smoke anymore.  Does not use smokeless tobacco. Types of alcohol consumed: Wine. Social Drinker.  Does not use drugs. Does not drink caffeine. Has not had a blood transfusion. Patient's occupation is/was retired.     REVIEW OF SYSTEMS:      GU Review Male:  Patient denies frequent urination, hard to postpone urination, burning/ pain with urination, get up at night to urinate, leakage of urine, stream starts and stops, trouble starting your stream, have to strain to urinate , erection problems, and penile pain.     Gastrointestinal (Upper):  Patient denies nausea, vomiting, and indigestion/ heartburn.     Gastrointestinal (Lower):  Patient denies diarrhea and constipation.     Constitutional:  Patient denies fever, night sweats, weight loss, and fatigue.     Skin:  Patient denies skin rash/ lesion and itching.     Eyes:  Patient denies blurred  vision and double vision.     Ears/ Nose/ Throat:  Patient denies sore throat and sinus problems.     Hematologic/Lymphatic:  Patient denies swollen glands and easy bruising.     Cardiovascular:  Patient denies leg swelling and chest pains.     Respiratory:  Patient denies cough and shortness of breath.     Endocrine:  Patient denies excessive thirst.     Musculoskeletal:  Patient denies back pain and joint pain.     Neurological:  Patient denies headaches and dizziness.     Psychologic:  Patient denies anxiety and depression.     VITAL SIGNS: None      GU PHYSICAL EXAMINATION:       Penis: Penis uncircumcised. No foreskin warts, no cracks. No dorsal peyronie's plaques, no left corporal peyronie's plaques, no right corporal peyronie's plaques, no scarring, no shaft warts. No balanitis, no meatal stenosis.       MULTI-SYSTEM PHYSICAL EXAMINATION:       Constitutional: Obese. Moderate physical deformities. Normally developed. Good grooming.  Respiratory: No labored breathing, no use of accessory muscles.       Cardiovascular: Normal temperature, normal extremity pulses, no swelling, no varicosities.      Skin: No paleness, no jaundice, no cyanosis. No lesion, no ulcer, no rash.      Neurologic / Psychiatric: Oriented to time, oriented to place, oriented to person. No depression, no anxiety, no agitation.      Gastrointestinal: No mass, no tenderness, no rigidity, non obese abdomen.      Notes: Bilateral above-the-knee amputee. Uses a wheelchair.              Complexity of Data:   Source Of History:  Patient  Records Review:  Previous Doctor Records, Previous Hospital Records, Previous Patient Records  Urine Test Review:  Urinalysis, Urine Culture  X-Ray Review: Renal Ultrasound: Reviewed Films.  C.T. Stone Protocol: Reviewed Films. Reviewed Report.      07/18/17 07/17/16 07/19/15 07/12/14 06/26/13 06/18/12 12/18/11 06/14/11  PSA  Total PSA <0.015 ng/mL 0.021 ng/dL 0.01  0.02   0.02  0.03  0.04  0.05     10/23/07  Hormones  Testosterone, Total <20.0     07/14/20  Urinalysis  Urine Appearance Cloudy   Urine Color Yellow   Urine Glucose Neg mg/dL  Urine Bilirubin Neg mg/dL  Urine Ketones Neg mg/dL  Urine Specific Gravity 1.015   Urine Blood 3+ ery/uL  Urine pH 5.5   Urine Protein 2+ mg/dL  Urine Urobilinogen 0.2 mg/dL  Urine Nitrites Positive   Urine Leukocyte Esterase 3+ leu/uL  Urine WBC/hpf 40 - 60/hpf   Urine RBC/hpf 20 - 40/hpf   Urine Epithelial Cells NS (Not Seen)   Urine Bacteria Many (>50/hpf)   Urine Mucous Not Present   Urine Yeast NS (Not Seen)   Urine Trichomonas Not Present   Urine Cystals NS (Not Seen)   Urine Casts NS (Not Seen)   Urine Sperm Not Present    PROCEDURES:    C.T. Urogram - [id number]      Patient confirmed No Neulasta OnPro Device.     Renal Ultrasound (Limited) [id number]  Kidney: Left Length: 15.3 cm Depth: 8.3 cm Cortical Width: 1.7 cm Width: 7.9 cm    Left Kidney/Ureter:  Increase of hydro since previous US. Multiple calcifications.  Bladder:  PVR 12.4 ml      Patient confirmed No Neulasta OnPro Device.      Urinalysis w/Scope - 81001  Dipstick Dipstick Cont'd Micro  Color: Yellow Bilirubin: Neg WBC/hpf: 40 - 60/hpf  Appearance: Cloudy Ketones: Neg RBC/hpf: 20 - 40/hpf  Specific Gravity: 1.015 Blood: 3+ Bacteria: Many (>50/hpf)  pH: 5.5 Protein: 2+ Cystals: NS (Not Seen)  Glucose: Neg Urobilinogen: 0.2 Casts: NS (Not Seen)   Nitrites: Positive Trichomonas: Not Present   Leukocyte Esterase: 3+ Mucous: Not Present    Epithelial Cells: NS (Not Seen)    Yeast: NS (Not Seen)    Sperm: Not Present   Notes:       ASSESSMENT:     ICD-10 Details  1 GU:  Ureteral calculus - N20.1 Left, Acute, Systemic Symptoms   PLAN:   Medications  New Meds: Augmentin 500 mg-125 mg tablet 1 tablet PO BID #20 0 Refill(s)    Orders  Labs CULTURE, URINE  X-Rays: C.T. Stone Protocol Without I.V. Contrast  X-Ray  Notes: History:  Hematuria: Yes/No  Patient to see MD after exam: Yes/No  Previous exam: CT / IVP/ US/ KUB/ None  When:  Where:  Diabetic: Yes/ No  BUN/ Creatine:  Date of last BUN Creatinine:  Weight in pounds:  Allergy- Contrasts/ Shellfish: Yes/ No  Conflicting diabetic meds: Yes/ No  Oral contrast and instructions given to patient:   Prior Authorization #: NPCR    Schedule    Document  Letter(s):  Created for Patient: Clinical Summary   Notes:  1. Ureteral Calculus: Renal ultrasound was concerning for redevelopment of left-sided hydronephrosis and therefore CT scan imaging was obtained. CT scan does show a 10 mm obstructing proximal calculus. Previously noted distal left sided calculus are not evident on today's CT. I will discuss this further with his urologist to see if ureteroscopy is indicated. The patient was given very strict return precautions for any worsening symptomatology. Final radiology read is pending. I will follow up with him in the next few days.   * Signed by Daine Gravel, NP on 07/16/20 at 11:22 AM (EDT)*    APPENDED NOTES:    I reviewed his KUB with him the demonstrates a radiolucent stone consistent with a uric acid stone. After reviewing options, he is amenable to proceeding with cystoscopy, left retrograde pyelography, left ureteroscopic laser lithotripsy, and left ureteral stent placement. The potential risks, complications, and expected recovery process have been discussed in detail. He gives informed consent to proceed. We will then discuss pH manipulation for further treatment/prevention.   * Signed by Raynelle Bring, M.D. on 07/21/20 at 6:11 PM (EDT)*

## 2020-08-02 NOTE — Anesthesia Preprocedure Evaluation (Deleted)
Anesthesia Evaluation  Patient identified by MRN, date of birth, ID band Patient awake    Reviewed: Allergy & Precautions, NPO status , Patient's Chart, lab work & pertinent test results, reviewed documented beta blocker date and time   Airway Mallampati: III  TM Distance: >3 FB Neck ROM: Full    Dental  (+) Missing, Dental Advisory Given,    Pulmonary sleep apnea and Continuous Positive Airway Pressure Ventilation , former smoker,    Pulmonary exam normal breath sounds clear to auscultation       Cardiovascular hypertension, Pt. on medications and Pt. on home beta blockers + CAD, + CABG and + Peripheral Vascular Disease  Normal cardiovascular exam Rhythm:Regular Rate:Normal  CABG x 5 2009  EKG 07/28/20 NSR, Normal   Neuro/Psych Diabetic peripheral neuropathy  Neuromuscular disease negative psych ROS   GI/Hepatic negative GI ROS, Neg liver ROS,   Endo/Other  diabetes, Well Controlled, Type 2, Oral Hypoglycemic Agents  Renal/GU Left ureteral calculus  negative genitourinary   Musculoskeletal  (+) Arthritis , Osteoarthritis,  S/P Bilateral AKA   Abdominal (+) + obese,   Peds  Hematology   Anesthesia Other Findings   Reproductive/Obstetrics                                                             Anesthesia Evaluation  Patient identified by MRN, date of birth, ID band Patient awake    Reviewed: Allergy & Precautions, NPO status , Patient's Chart, lab work & pertinent test results  History of Anesthesia Complications Negative for: history of anesthetic complications  Airway Mallampati: III  TM Distance: >3 FB Neck ROM: Full    Dental  (+) Teeth Intact   Pulmonary sleep apnea , former smoker,    Pulmonary exam normal        Cardiovascular hypertension, + CAD, + CABG (2007) and + Peripheral Vascular Disease  Normal cardiovascular exam     Neuro/Psych   Neuromuscular disease negative psych ROS   GI/Hepatic negative GI ROS, Neg liver ROS,   Endo/Other  diabetes, Type 2Morbid obesity  Renal/GU Renal InsufficiencyRenal disease  negative genitourinary   Musculoskeletal  (+) Arthritis ,   Abdominal   Peds  Hematology negative hematology ROS (+)   Anesthesia Other Findings   Reproductive/Obstetrics                             Anesthesia Physical  Anesthesia Plan  ASA: III  Anesthesia Plan: MAC   Post-op Pain Management:    Induction: Intravenous  PONV Risk Score and Plan: 2 and Ondansetron, Dexamethasone, Treatment may vary due to age or medical condition and Midazolam  Airway Management Planned: Nasal Cannula, Mask and Simple Face Mask  Additional Equipment: None  Intra-op Plan:   Post-operative Plan:   Informed Consent: I have reviewed the patients History and Physical, chart, labs and discussed the procedure including the risks, benefits and alternatives for the proposed anesthesia with the patient or authorized representative who has indicated his/her understanding and acceptance.     Dental advisory given  Plan Discussed with: Anesthesiologist, CRNA and Surgeon  Anesthesia Plan Comments:         Anesthesia Quick Evaluation  Anesthesia Physical Anesthesia Plan  ASA: 3  Anesthesia  Plan: General   Post-op Pain Management:    Induction: Intravenous  PONV Risk Score and Plan: 4 or greater and Treatment may vary due to age or medical condition and Ondansetron  Airway Management Planned: LMA  Additional Equipment:   Intra-op Plan:   Post-operative Plan: Extubation in OR  Informed Consent: I have reviewed the patients History and Physical, chart, labs and discussed the procedure including the risks, benefits and alternatives for the proposed anesthesia with the patient or authorized representative who has indicated his/her understanding and acceptance.     Dental  advisory given  Plan Discussed with: CRNA and Anesthesiologist  Anesthesia Plan Comments: (See PAT note 07/28/2020, Konrad Felix, PA-C)       Anesthesia Quick Evaluation

## 2020-08-03 ENCOUNTER — Ambulatory Visit (HOSPITAL_COMMUNITY)
Admission: RE | Admit: 2020-08-03 | Discharge: 2020-08-03 | Disposition: A | Discharge status: Home / Self Care | Payer: Medicare Other | Attending: Urology | Admitting: Urology

## 2020-08-03 ENCOUNTER — Ambulatory Visit (HOSPITAL_COMMUNITY): Payer: Medicare Other | Admitting: Certified Registered"

## 2020-08-03 ENCOUNTER — Encounter (HOSPITAL_COMMUNITY): Payer: Self-pay | Admitting: Urology

## 2020-08-03 ENCOUNTER — Encounter (HOSPITAL_COMMUNITY)
Admission: RE | Disposition: A | Discharge status: Home / Self Care | Payer: Self-pay | Source: Home / Self Care | Attending: Urology

## 2020-08-03 ENCOUNTER — Ambulatory Visit (HOSPITAL_COMMUNITY): Payer: Medicare Other

## 2020-08-03 ENCOUNTER — Ambulatory Visit (HOSPITAL_COMMUNITY): Payer: Medicare Other | Admitting: Physician Assistant

## 2020-08-03 DIAGNOSIS — Z7984 Long term (current) use of oral hypoglycemic drugs: Secondary | ICD-10-CM | POA: Insufficient documentation

## 2020-08-03 DIAGNOSIS — Z955 Presence of coronary angioplasty implant and graft: Secondary | ICD-10-CM | POA: Diagnosis not present

## 2020-08-03 DIAGNOSIS — Z7982 Long term (current) use of aspirin: Secondary | ICD-10-CM | POA: Diagnosis not present

## 2020-08-03 DIAGNOSIS — N136 Pyonephrosis: Secondary | ICD-10-CM | POA: Diagnosis not present

## 2020-08-03 DIAGNOSIS — E119 Type 2 diabetes mellitus without complications: Secondary | ICD-10-CM | POA: Diagnosis not present

## 2020-08-03 DIAGNOSIS — Z87891 Personal history of nicotine dependence: Secondary | ICD-10-CM | POA: Diagnosis not present

## 2020-08-03 DIAGNOSIS — Z801 Family history of malignant neoplasm of trachea, bronchus and lung: Secondary | ICD-10-CM | POA: Insufficient documentation

## 2020-08-03 DIAGNOSIS — N201 Calculus of ureter: Secondary | ICD-10-CM | POA: Diagnosis not present

## 2020-08-03 DIAGNOSIS — R338 Other retention of urine: Secondary | ICD-10-CM | POA: Insufficient documentation

## 2020-08-03 DIAGNOSIS — Z888 Allergy status to other drugs, medicaments and biological substances status: Secondary | ICD-10-CM | POA: Insufficient documentation

## 2020-08-03 DIAGNOSIS — Z79899 Other long term (current) drug therapy: Secondary | ICD-10-CM | POA: Diagnosis not present

## 2020-08-03 DIAGNOSIS — C61 Malignant neoplasm of prostate: Secondary | ICD-10-CM | POA: Insufficient documentation

## 2020-08-03 DIAGNOSIS — I10 Essential (primary) hypertension: Secondary | ICD-10-CM | POA: Insufficient documentation

## 2020-08-03 DIAGNOSIS — Z951 Presence of aortocoronary bypass graft: Secondary | ICD-10-CM | POA: Diagnosis not present

## 2020-08-03 DIAGNOSIS — I2510 Atherosclerotic heart disease of native coronary artery without angina pectoris: Secondary | ICD-10-CM | POA: Diagnosis not present

## 2020-08-03 DIAGNOSIS — Z803 Family history of malignant neoplasm of breast: Secondary | ICD-10-CM | POA: Diagnosis not present

## 2020-08-03 DIAGNOSIS — E7800 Pure hypercholesterolemia, unspecified: Secondary | ICD-10-CM | POA: Diagnosis not present

## 2020-08-03 DIAGNOSIS — Z794 Long term (current) use of insulin: Secondary | ICD-10-CM | POA: Diagnosis not present

## 2020-08-03 DIAGNOSIS — Z89612 Acquired absence of left leg above knee: Secondary | ICD-10-CM | POA: Insufficient documentation

## 2020-08-03 DIAGNOSIS — N401 Enlarged prostate with lower urinary tract symptoms: Secondary | ICD-10-CM | POA: Diagnosis not present

## 2020-08-03 DIAGNOSIS — R3912 Poor urinary stream: Secondary | ICD-10-CM | POA: Insufficient documentation

## 2020-08-03 DIAGNOSIS — Z9989 Dependence on other enabling machines and devices: Secondary | ICD-10-CM | POA: Diagnosis not present

## 2020-08-03 DIAGNOSIS — G4733 Obstructive sleep apnea (adult) (pediatric): Secondary | ICD-10-CM | POA: Diagnosis not present

## 2020-08-03 HISTORY — PX: CYSTOSCOPY/URETEROSCOPY/HOLMIUM LASER/STENT PLACEMENT: SHX6546

## 2020-08-03 LAB — BASIC METABOLIC PANEL
Anion gap: 7 (ref 5–15)
BUN: 25 mg/dL — ABNORMAL HIGH (ref 8–23)
CO2: 23 mmol/L (ref 22–32)
Calcium: 8.8 mg/dL — ABNORMAL LOW (ref 8.9–10.3)
Chloride: 107 mmol/L (ref 98–111)
Creatinine, Ser: 1.59 mg/dL — ABNORMAL HIGH (ref 0.61–1.24)
GFR, Estimated: 46 mL/min — ABNORMAL LOW (ref >60)
Glucose, Bld: 102 mg/dL — ABNORMAL HIGH (ref 70–99)
Potassium: 4.7 mmol/L (ref 3.5–5.1)
Sodium: 137 mmol/L (ref 135–145)

## 2020-08-03 LAB — GLUCOSE, CAPILLARY: Glucose-Capillary: 104 mg/dL — ABNORMAL HIGH (ref 70–99)

## 2020-08-03 SURGERY — CYSTOSCOPY/URETEROSCOPY/HOLMIUM LASER/STENT PLACEMENT
Anesthesia: General | Laterality: Left

## 2020-08-03 MED ORDER — ORAL CARE MOUTH RINSE: 15.0000 mL | Freq: Once | OROMUCOSAL | Status: AC

## 2020-08-03 MED ORDER — ONDANSETRON HCL 4 MG/2ML IJ SOLN
INTRAMUSCULAR | Status: DC | PRN
  Administered 2020-08-03: 4 mg via INTRAVENOUS

## 2020-08-03 MED ORDER — PROPOFOL 10 MG/ML IV BOLUS
INTRAVENOUS | Status: DC | PRN
  Administered 2020-08-03: 120 mg via INTRAVENOUS

## 2020-08-03 MED ORDER — CEFDINIR 300 MG PO CAPS: 300.0000 mg | ORAL_CAPSULE | Freq: Two times a day (BID) | ORAL | 0 refills | Status: DC

## 2020-08-03 MED ORDER — SODIUM CHLORIDE 0.9 % IV SOLN
2.0000 g | INTRAVENOUS | Status: AC
  Administered 2020-08-03: 2 g via INTRAVENOUS
  Filled 2020-08-03: qty 20

## 2020-08-03 MED ORDER — SODIUM CHLORIDE 0.9 % IR SOLN
Status: DC | PRN
  Administered 2020-08-03: 3000 mL

## 2020-08-03 MED ORDER — OXYCODONE HCL 5 MG/5ML PO SOLN: 5.0000 mg | Freq: Once | ORAL | Status: DC | PRN

## 2020-08-03 MED ORDER — OXYCODONE HCL 5 MG PO TABS: 5.0000 mg | ORAL_TABLET | Freq: Once | ORAL | Status: DC | PRN

## 2020-08-03 MED ORDER — FENTANYL CITRATE (PF) 100 MCG/2ML IJ SOLN
INTRAMUSCULAR | Status: DC | PRN
  Administered 2020-08-03: 50 ug via INTRAVENOUS
  Administered 2020-08-03 (×2): 25 ug via INTRAVENOUS

## 2020-08-03 MED ORDER — ONDANSETRON HCL 4 MG/2ML IJ SOLN: 4.0000 mg | Freq: Once | INTRAMUSCULAR | Status: DC | PRN

## 2020-08-03 MED ORDER — LACTATED RINGERS IV SOLN: INTRAVENOUS | Status: DC

## 2020-08-03 MED ORDER — LIDOCAINE 2% (20 MG/ML) 5 ML SYRINGE
INTRAMUSCULAR | Status: AC
  Filled 2020-08-03: qty 5

## 2020-08-03 MED ORDER — CHLORHEXIDINE GLUCONATE 0.12 % MT SOLN
15.0000 mL | Freq: Once | OROMUCOSAL | Status: AC
  Administered 2020-08-03: 15 mL via OROMUCOSAL

## 2020-08-03 MED ORDER — PHENYLEPHRINE 40 MCG/ML (10ML) SYRINGE FOR IV PUSH (FOR BLOOD PRESSURE SUPPORT)
PREFILLED_SYRINGE | INTRAVENOUS | Status: DC | PRN
  Administered 2020-08-03: 40 ug via INTRAVENOUS
  Administered 2020-08-03: 80 ug via INTRAVENOUS

## 2020-08-03 MED ORDER — ONDANSETRON HCL 4 MG/2ML IJ SOLN
INTRAMUSCULAR | Status: AC
  Filled 2020-08-03: qty 2

## 2020-08-03 MED ORDER — ARTIFICIAL TEARS OPHTHALMIC OINT
TOPICAL_OINTMENT | OPHTHALMIC | Status: AC
  Filled 2020-08-03: qty 3.5

## 2020-08-03 MED ORDER — IOHEXOL 300 MG/ML  SOLN
INTRAMUSCULAR | Status: DC | PRN
  Administered 2020-08-03: 10 mL

## 2020-08-03 MED ORDER — PHENYLEPHRINE 40 MCG/ML (10ML) SYRINGE FOR IV PUSH (FOR BLOOD PRESSURE SUPPORT)
PREFILLED_SYRINGE | INTRAVENOUS | Status: AC
  Filled 2020-08-03: qty 10

## 2020-08-03 MED ORDER — FENTANYL CITRATE (PF) 100 MCG/2ML IJ SOLN
INTRAMUSCULAR | Status: AC
  Filled 2020-08-03: qty 2

## 2020-08-03 MED ORDER — LIDOCAINE 2% (20 MG/ML) 5 ML SYRINGE
INTRAMUSCULAR | Status: DC | PRN
  Administered 2020-08-03: 60 mg via INTRAVENOUS

## 2020-08-03 MED ORDER — FENTANYL CITRATE (PF) 100 MCG/2ML IJ SOLN
25.0000 ug | INTRAMUSCULAR | Status: DC | PRN
Start: 2020-08-03 — End: 2020-08-03

## 2020-08-03 SURGICAL SUPPLY — 20 items
BAG URO CATCHER STRL LF (MISCELLANEOUS) ×2 IMPLANT
BASKET ZERO TIP NITINOL 2.4FR (BASKET) IMPLANT
CATH INTERMIT  6FR 70CM (CATHETERS) IMPLANT
CLOTH BEACON ORANGE TIMEOUT ST (SAFETY) ×2 IMPLANT
GLOVE SURG ENC TEXT LTX SZ7.5 (GLOVE) ×2 IMPLANT
GOWN STRL REUS W/TWL LRG LVL3 (GOWN DISPOSABLE) ×2 IMPLANT
GUIDEWIRE STR DUAL SENSOR (WIRE) ×2 IMPLANT
GUIDEWIRE ZIPWRE .038 STRAIGHT (WIRE) IMPLANT
IV NS 1000ML (IV SOLUTION) ×2
IV NS 1000ML BAXH (IV SOLUTION) ×1 IMPLANT
KIT TURNOVER KIT A (KITS) ×2 IMPLANT
LASER FIB FLEXIVA PULSE ID 365 (Laser) IMPLANT
MANIFOLD NEPTUNE II (INSTRUMENTS) ×2 IMPLANT
PACK CYSTO (CUSTOM PROCEDURE TRAY) ×2 IMPLANT
SHEATH URETERAL 12FRX35CM (MISCELLANEOUS) IMPLANT
STENT URET 6FRX26 CONTOUR (STENTS) ×2 IMPLANT
TRACTIP FLEXIVA PULS ID 200XHI (Laser) IMPLANT
TRACTIP FLEXIVA PULSE ID 200 (Laser)
TUBING CONNECTING 10 (TUBING) ×2 IMPLANT
TUBING UROLOGY SET (TUBING) ×2 IMPLANT

## 2020-08-03 NOTE — Discharge Instructions (Signed)

## 2020-08-03 NOTE — Anesthesia Postprocedure Evaluation (Signed)
Anesthesia Post Note  Patient: [name]  Procedure(s) Performed: CYSTOSCOPY/ RETROGRADE/STENT PLACEMENT (Left)     Patient location during evaluation: PACU Anesthesia Type: General Level of consciousness: awake and alert and oriented Pain management: pain level controlled Vital Signs Assessment: post-procedure vital signs reviewed and stable Respiratory status: spontaneous breathing, nonlabored ventilation and respiratory function stable Cardiovascular status: blood pressure returned to baseline and stable Postop Assessment: no apparent nausea or vomiting Anesthetic complications: no   No notable events documented.  Last Vitals:  Vitals:   08/03/20 1315 08/03/20 1330  BP: 129/66 127/66  Pulse: 85 84  Resp: 17 18  Temp:    SpO2: 100% 94%    Last Pain:  Vitals:   08/03/20 1330  TempSrc:   PainSc: 0-No pain                 Kenyatte Gruber A.

## 2020-08-03 NOTE — Op Note (Signed)
Preoperative diagnosis:  Left ureteral calculus  Postoperative diagnosis:  Left ureteral calculus Urinary tract infection  Procedure:  Cystoscopy Left ureteral stent placement (6 x 26 - no string)  Left retrograde pyelography with interpretation   Surgeon: Pryor Curia. M.D.  Anesthesia: General  Complications: None  Intraoperative findings: Left retrograde pyelogram was performed with a 6 French ureteral catheter and Omnipaque contrast.  This revealed normal caliber ureter up to the lower portion of the proximal left ureter where there was evidence of complete obstruction.  No contrast was seen above the level of the stone.  EBL: Minimal  Specimens: None  Indication: [name] is a 70 y.o. patient with an obstructing proximal left ureteral stone. After reviewing the management options for treatment, he elected to proceed with the above surgical procedure(s). We have discussed the potential benefits and risks of the procedure, side effects of the proposed treatment, the likelihood of the patient achieving the goals of the procedure, and any potential problems that might occur during the procedure or recuperation. Informed consent has been obtained.  Description of procedure:  The patient was taken to the operating room and general anesthesia was induced.  The patient was placed in the dorsal lithotomy position, prepped and draped in the usual sterile fashion, and preoperative antibiotics were administered. A preoperative time-out was performed.   Cystourethroscopy was performed.  The patient's urethra was examined and demonstrated some evidence of prior trauma possibly related to her recent urethral catheterizations. The bladder was then systematically examined in its entirety. There was no evidence for any bladder tumors, stones, or other mucosal pathology.    Attention then turned to the left ureteral orifice and a ureteral catheter was used to intubate the ureteral  orifice.  Omnipaque contrast was injected through the ureteral catheter and a retrograde pyelogram was performed with findings as dictated above.  A 0.38 sensor guidewire was then advanced up the left ureter into the renal pelvis under fluoroscopic guidance.  Once the wire was placed past the stone and into the renal pelvis, there was an immediate return of purulent appearing urine.  It was therefore decided not to proceed with ureteroscopy and rather to proceed with ureteral stent placement.  A urine culture was obtained. A 6 x 26 double J ureteral stent was advance over the wire using Seldinger technique.  The stent was positioned appropriately under fluoroscopic and cystoscopic guidance.  The wire was then removed with an adequate stent curl noted in the renal pelvis as well as in the bladder.  The bladder was then emptied and the procedure ended.  The patient appeared to tolerate the procedure well and without complications.  The patient was able to be awakened and transferred to the recovery unit in satisfactory condition.    Pryor Curia MD

## 2020-08-03 NOTE — Transfer of Care (Signed)
Immediate Anesthesia Transfer of Care Note  Patient: [name]  Procedure(s) Performed: Procedure(s) (LRB): CYSTOSCOPY/ RETROGRADE/STENT PLACEMENT (Left)  Patient Location: PACU  Anesthesia Type: General  Level of Consciousness: awake, oriented, sedated and patient cooperative  Airway & Oxygen Therapy: Patient Spontanous Breathing and Patient connected to face mask oxygen  Post-op Assessment: Report given to PACU RN and Post -op Vital signs reviewed and stable  Post vital signs: Reviewed and stable  Complications: No apparent anesthesia complications Last Vitals:  Vitals Value Taken Time  BP 125/68 08/03/20 1301  Temp    Pulse 86 08/03/20 1304  Resp 17 08/03/20 1304  SpO2 100 % 08/03/20 1304  Vitals shown include unvalidated device data.  Last Pain:  Vitals:   08/03/20 1119  TempSrc: Oral  PainSc:          Complications: No notable events documented.

## 2020-08-03 NOTE — Anesthesia Preprocedure Evaluation (Addendum)
Anesthesia Evaluation  Patient identified by MRN, date of birth, ID band Patient awake    Reviewed: Allergy & Precautions, NPO status , Patient's Chart, lab work & pertinent test results, reviewed documented beta blocker date and time   Airway Mallampati: III  TM Distance: >3 FB Neck ROM: Full    Dental  (+) Missing, Dental Advisory Given, Poor Dentition,    Pulmonary sleep apnea and Continuous Positive Airway Pressure Ventilation , former smoker,    Pulmonary exam normal breath sounds clear to auscultation + decreased breath sounds(-) wheezing      Cardiovascular hypertension, Pt. on medications and Pt. on home beta blockers + CAD, + CABG and + Peripheral Vascular Disease  Normal cardiovascular exam Rhythm:Regular Rate:Normal  CABG x 5 2009   Neuro/Psych Diabetic peripheral neuropathy  Neuromuscular disease negative psych ROS   GI/Hepatic negative GI ROS, Neg liver ROS,   Endo/Other  diabetes, Well Controlled, Type 2, Oral Hypoglycemic Agents, Insulin DependentObesity  Renal/GU Left ureteral calculus  negative genitourinary   Musculoskeletal  (+) Arthritis , Osteoarthritis,  S/P Bilateral AKA   Abdominal   Peds  Hematology negative hematology ROS (+)   Anesthesia Other Findings   Reproductive/Obstetrics                             Anesthesia Physical Anesthesia Plan  ASA: 3  Anesthesia Plan: General   Post-op Pain Management:    Induction: Intravenous  PONV Risk Score and Plan: 4 or greater and Treatment may vary due to age or medical condition and Ondansetron  Airway Management Planned: LMA  Additional Equipment:   Intra-op Plan:   Post-operative Plan: Extubation in OR  Informed Consent: I have reviewed the patients History and Physical, chart, labs and discussed the procedure including the risks, benefits and alternatives for the proposed anesthesia with the patient or  authorized representative who has indicated his/her understanding and acceptance.     Dental advisory given  Plan Discussed with: CRNA and Anesthesiologist  Anesthesia Plan Comments:         Anesthesia Quick Evaluation

## 2020-08-03 NOTE — Anesthesia Procedure Notes (Signed)
Procedure Name: LMA Insertion Date/Time: 08/03/2020 12:32 PM Performed by: Suan Halter, CRNA Pre-anesthesia Checklist: Patient identified, Emergency Drugs available, Suction available and Patient being monitored Patient Re-evaluated:Patient Re-evaluated prior to induction Oxygen Delivery Method: Circle system utilized Preoxygenation: Pre-oxygenation with 100% oxygen Induction Type: IV induction Ventilation: Mask ventilation without difficulty LMA: LMA inserted LMA Size: 4.0 Number of attempts: 1 Airway Equipment and Method: Bite block Placement Confirmation: positive ETCO2 Tube secured with: Tape Dental Injury: Teeth and Oropharynx as per pre-operative assessment

## 2020-08-03 NOTE — Interval H&P Note (Signed)
History and Physical Interval Note:  08/03/2020 12:04 PM  [name]  has presented today for surgery, with the diagnosis of LEFT URETERAL CALCULUS.  The various methods of treatment have been discussed with the patient and family. After consideration of risks, benefits and other options for treatment, the patient has consented to  Procedure(s): CYSTOSCOPY/ RETROGRADE/URETEROSCOPY/HOLMIUM LASER/STENT PLACEMENT (Left) as a surgical intervention.  The patient's history has been reviewed, patient examined, no change in status, stable for surgery.  I have reviewed the patient's chart and labs.  Questions were answered to the patient's satisfaction.     [employer]

## 2020-08-04 ENCOUNTER — Encounter (HOSPITAL_COMMUNITY): Payer: Self-pay | Admitting: Urology

## 2020-08-05 LAB — URINE CULTURE: Culture: >=100000 — AB

## 2020-08-07 ENCOUNTER — Other Ambulatory Visit: Payer: Self-pay | Admitting: Urology

## 2020-08-08 ENCOUNTER — Other Ambulatory Visit: Payer: Self-pay | Admitting: Internal Medicine

## 2020-08-11 NOTE — Patient Instructions (Addendum)
DUE TO COVID-19 ONLY ONE VISITOR IS ALLOWED TO COME WITH YOU AND STAY IN THE WAITING ROOM ONLY DURING PRE OP AND PROCEDURE DAY OF SURGERY. THE 1 VISITOR  MAY VISIT WITH YOU AFTER SURGERY IN YOUR PRIVATE ROOM DURING VISITING HOURS ONLY!               [name]   Your procedure is scheduled on: 08/28/20   Report to [facility] Main  Entrance   Report to admitting at: 11:30 AM     Call this number if you have problems the morning of surgery [phone]    Remember: Do not eat solid food :After Midnight. Clear liquids until: 10:30 am.   CLEAR LIQUID DIET  Foods Allowed                                                                     Foods Excluded  Coffee and tea, regular and decaf                             liquids that you cannot  Plain Jell-O any favor except red or purple                                           see through such as: Fruit ices (not with fruit pulp)                                     milk, soups, orange juice  Iced Popsicles                                    All solid food Carbonated beverages, regular and diet                                    Cranberry, grape and apple juices Sports drinks like Gatorade Lightly seasoned clear broth or consume(fat free) Sugar, honey syrup  Sample Menu Breakfast                                Lunch                                     Supper Cranberry juice                    Beef broth                            Chicken broth Jell-O  Grape juice                           Apple juice Coffee or tea                        Jell-O                                      Popsicle                                                Coffee or tea                        Coffee or tea  _____________________________________________________________________   BRUSH YOUR TEETH MORNING OF SURGERY AND RINSE YOUR MOUTH OUT, NO CHEWING GUM CANDY OR MINTS.    Take these medicines the morning of  surgery with A SIP OF WATER: carvedilol.  How to Manage Your Diabetes Before and After Surgery  Why is it important to control my blood sugar before and after surgery? Improving blood sugar levels before and after surgery helps healing and can limit problems. A way of improving blood sugar control is eating a healthy diet by:  Eating less sugar and carbohydrates  Increasing activity/exercise  Talking with your doctor about reaching your blood sugar goals High blood sugars (greater than 180 mg/dL) can raise your risk of infections and slow your recovery, so you will need to focus on controlling your diabetes during the weeks before surgery. Make sure that the doctor who takes care of your diabetes knows about your planned surgery including the date and location.  How do I manage my blood sugar before surgery? Check your blood sugar at least 4 times a day, starting 2 days before surgery, to make sure that the level is not too high or low. Check your blood sugar the morning of your surgery when you wake up and every 2 hours until you get to the Short Stay unit. If your blood sugar is less than 70 mg/dL, you will need to treat for low blood sugar: Do not take insulin. Treat a low blood sugar (less than 70 mg/dL) with  cup of clear juice (cranberry or apple), 4 glucose tablets, OR glucose gel. Recheck blood sugar in 15 minutes after treatment (to make sure it is greater than 70 mg/dL). If your blood sugar is not greater than 70 mg/dL on recheck, call [phone] for further instructions. Report your blood sugar to the short stay nurse when you get to Short Stay.  If you are admitted to the hospital after surgery: Your blood sugar will be checked by the staff and you will probably be given insulin after surgery (instead of oral diabetes medicines) to make sure you have good blood sugar levels. The goal for blood sugar control after surgery is 80-180 mg/dL.   WHAT DO I DO ABOUT MY DIABETES  MEDICATION?  Do not take oral diabetes medicines (pills) the morning of surgery.  THE DAY BEFORE SURGERY, take half of the usual dose of glargine insulin. DO NOT take the bedtime dose of novolog.Take metformin as usual.    THE MORNING OF SURGERY, DO NOT  TAKE ANY ORAL DIABETIC MEDICATIONS DAY OF YOUR SURGERY  If your CBG is greater than 220 mg/dL, you may take  of your sliding scale  (correction) dose of insulin.                             You may not have any metal on your body including hair pins and              piercings  Do not wear jewelry,lotions, powders or perfumes, deodorant             Men may shave face and neck.   Do not bring valuables to the hospital. [place].  Contacts, dentures or bridgework may not be worn into surgery.  Leave suitcase in the car. After surgery it may be brought to your room.     Patients discharged the day of surgery will not be allowed to drive home. IF YOU ARE HAVING SURGERY AND GOING HOME THE SAME DAY, YOU MUST HAVE AN ADULT TO DRIVE YOU HOME AND BE WITH YOU FOR 24 HOURS. YOU MAY GO HOME BY TAXI OR UBER OR ORTHERWISE, BUT AN ADULT MUST ACCOMPANY YOU HOME AND STAY WITH YOU FOR 24 HOURS.  Name and phone number of your driver:  Special Instructions: N/A              Please read over the following fact sheets you were given: _____________________________________________________________________           [facility] - Preparing for Surgery Before surgery, you can play an important role.  Because skin is not sterile, your skin needs to be as free of germs as possible.  You can reduce the number of germs on your skin by washing with CHG (chlorahexidine gluconate) soap before surgery.  CHG is an antiseptic cleaner which kills germs and bonds with the skin to continue killing germs even after washing. Please DO NOT use if you have an allergy to CHG or antibacterial soaps.  If your skin becomes  reddened/irritated stop using the CHG and inform your nurse when you arrive at Short Stay. Do not shave (including legs and underarms) for at least 48 hours prior to the first CHG shower.  You may shave your face/neck. Please follow these instructions carefully:  1.  Shower with CHG Soap the night before surgery and the  morning of Surgery.  2.  If you choose to wash your hair, wash your hair first as usual with your  normal  shampoo.  3.  After you shampoo, rinse your hair and body thoroughly to remove the  shampoo.                           4.  Use CHG as you would any other liquid soap.  You can apply chg directly  to the skin and wash                       Gently with a scrungie or clean washcloth.  5.  Apply the CHG Soap to your body ONLY FROM THE NECK DOWN.   Do not use on face/ open  Wound or open sores. Avoid contact with eyes, ears mouth and genitals (private parts).                       Wash face,  Genitals (private parts) with your normal soap.             6.  Wash thoroughly, paying special attention to the area where your surgery  will be performed.  7.  Thoroughly rinse your body with warm water from the neck down.  8.  DO NOT shower/wash with your normal soap after using and rinsing off  the CHG Soap.                9.  Pat yourself dry with a clean towel.            10.  Wear clean pajamas.            11.  Place clean sheets on your bed the night of your first shower and do not  sleep with pets. Day of Surgery : Do not apply any lotions/deodorants the morning of surgery.  Please wear clean clothes to the hospital/surgery center.  FAILURE TO FOLLOW THESE INSTRUCTIONS MAY RESULT IN THE CANCELLATION OF YOUR SURGERY PATIENT SIGNATURE_________________________________  NURSE SIGNATURE__________________________________  ________________________________________________________________________

## 2020-08-14 ENCOUNTER — Encounter (HOSPITAL_COMMUNITY): Payer: Self-pay

## 2020-08-14 ENCOUNTER — Other Ambulatory Visit: Payer: Self-pay

## 2020-08-14 ENCOUNTER — Encounter (HOSPITAL_COMMUNITY)
Admission: RE | Admit: 2020-08-14 | Discharge: 2020-08-14 | Disposition: A | Discharge status: Home / Self Care | Payer: Medicare Other | Source: Ambulatory Visit | Attending: Urology | Admitting: Urology

## 2020-08-14 DIAGNOSIS — Z01812 Encounter for preprocedural laboratory examination: Secondary | ICD-10-CM | POA: Diagnosis not present

## 2020-08-14 LAB — BASIC METABOLIC PANEL
Anion gap: 10 (ref 5–15)
BUN: 29 mg/dL — ABNORMAL HIGH (ref 8–23)
CO2: 23 mmol/L (ref 22–32)
Calcium: 9.3 mg/dL (ref 8.9–10.3)
Chloride: 104 mmol/L (ref 98–111)
Creatinine, Ser: 1.44 mg/dL — ABNORMAL HIGH (ref 0.61–1.24)
GFR, Estimated: 52 mL/min — ABNORMAL LOW (ref >60)
Glucose, Bld: 90 mg/dL (ref 70–99)
Potassium: 4.8 mmol/L (ref 3.5–5.1)
Sodium: 137 mmol/L (ref 135–145)

## 2020-08-14 LAB — CBC
HCT: 45 % (ref 39.0–52.0)
Hemoglobin: 14.7 g/dL (ref 13.0–17.0)
MCH: 25.5 pg — ABNORMAL LOW (ref 26.0–34.0)
MCHC: 32.7 g/dL (ref 30.0–36.0)
MCV: 78 fL — ABNORMAL LOW (ref 80.0–100.0)
Platelets: 252 10*3/uL (ref 150–400)
RBC: 5.77 MIL/uL (ref 4.22–5.81)
RDW: 21.7 % — ABNORMAL HIGH (ref 11.5–15.5)
WBC: 11 10*3/uL — ABNORMAL HIGH (ref 4.0–10.5)
nRBC: 1.3 % — ABNORMAL HIGH (ref 0.0–0.2)

## 2020-08-14 LAB — GLUCOSE, CAPILLARY: Glucose-Capillary: 103 mg/dL — ABNORMAL HIGH (ref 70–99)

## 2020-08-14 NOTE — Progress Notes (Signed)
COVID Vaccine Completed: Yes x 3 Date COVID Vaccine completed:03/16/19 COVID vaccine manufacturer: Pfizer      PCP - Dr. Viviana Simpler. LOV: 05/11/20 Cardiologist - NO  Chest x-ray -  EKG - 07/28/20 Stress Test -  ECHO -  Cardiac Cath -  Pacemaker/ICD device last checked:  Sleep Study - Yes CPAP - Yes  Fasting Blood Sugar - 99-130's Checks Blood Sugar ___1__ times a day  Blood Thinner Instructions: Aspirin Instructions: Last Dose:  Anesthesia review: Hx: HTN,DIA,OSA(CPAP),CAD,PVD  Patient denies shortness of breath, fever, cough and chest pain at PAT appointment   Patient verbalized understanding of instructions that were given to them at the PAT appointment. Patient was also instructed that they will need to review over the PAT instructions again at home before surgery.

## 2020-08-18 ENCOUNTER — Telehealth: Payer: Self-pay | Admitting: *Deleted

## 2020-08-18 ENCOUNTER — Other Ambulatory Visit: Payer: Self-pay

## 2020-08-18 DIAGNOSIS — R0902 Hypoxemia: Secondary | ICD-10-CM | POA: Diagnosis not present

## 2020-08-18 DIAGNOSIS — U071 COVID-19: Secondary | ICD-10-CM | POA: Diagnosis not present

## 2020-08-18 NOTE — Telephone Encounter (Signed)
Spoke to patient by telephone and was advised that he went to [facility] and the wait was too long. Patient stated that he is at [facility] in [place] waiting to be seen there. Patient was advised to make sure he rest and drink lots of fluids. Patient was given ER precautions and he verbalized understanding.

## 2020-08-18 NOTE — Telephone Encounter (Signed)
PLEASE NOTE: All timestamps contained within this report are represented as [place] Standard Time. CONFIDENTIALTY NOTICE: This fax transmission is intended only for the addressee. It contains information that is legally privileged, confidential or otherwise protected from use or disclosure. If you are not the intended recipient, you are strictly prohibited from reviewing, disclosing, copying using or disseminating any of this information or taking any action in reliance on or regarding this information. If you have received this fax in error, please notify us immediately by telephone so that we can arrange for its return to [place]. Phone: [phone], Toll-Free: [phone], Fax: [phone] Page: 1 of 3 Call Id: [id number] [facility] - Client TELEPHONE ADVICE RECORD [facility] Patient Name: [name] Gender: Male DOB: 21-Jun-1950 Age: 70 Y 3 M 1 D Return Phone Number: [phone] (Primary), [phone] (Secondary) Address: [place]  [address] Client [facility] - Client Client Site [place] [facility] Day Physician Viviana Simpler- MD Contact Type Call Who Is Calling Patient / Member / Family / Caregiver Call Type Triage / Clinical Relationship To Patient Self Return Phone Number [phone] (Primary) Chief Complaint Fever (non-urgent symptom) (greater than THREE MONTHS old) Reason for Call Symptomatic / Request for [place] states he is experiencing a cough fever sore throat and congestion. Translation No Nurse Assessment Nurse: Raenette Rover, RN, Zella Ball Date/Time (Eastern Time): 08/18/2020 8:57:50 AM Confirm and document reason for call. If symptomatic, describe symptoms. ---Caller states he is experiencing a cough fever sore throat and congestion. Was 98-100 Positive home covid test last night. Faint positive line. Does the patient have any new or  worsening symptoms? ---Yes Will a triage be completed? ---Yes Related visit to physician within the last 2 weeks? ---No Does the PT have any chronic conditions? (i.e. diabetes, asthma, this includes High risk factors for pregnancy, etc.) ---Yes List chronic conditions. ---diabetic, CAbg 5 vessels Is this a behavioral health or substance abuse call? ---No Guidelines Guideline Title Affirmed Question Affirmed Notes Nurse Date/Time (Eastern Time) COVID-19 - Diagnosed or Suspected [1] HIGH RISK for severe COVID complications (e.g., weak immune system, age > 31 years, obesity with BMI > 78, pregnant, chronic lung disease or other chronic medical Raenette Rover, RN, Zella Ball 08/18/2020 9:01:45 AM PLEASE NOTE: All timestamps contained within this report are represented as [place] Standard Time. CONFIDENTIALTY NOTICE: This fax transmission is intended only for the addressee. It contains information that is legally privileged, confidential or otherwise protected from use or disclosure. If you are not the intended recipient, you are strictly prohibited from reviewing, disclosing, copying using or disseminating any of this information or taking any action in reliance on or regarding this information. If you have received this fax in error, please notify us immediately by telephone so that we can arrange for its return to [place]. Phone: [phone], Toll-Free: [phone], Fax: [phone] Page: 2 of 3 Call Id: [id number] Guidelines Guideline Title Affirmed Question Affirmed Notes Nurse Date/Time Eilene Ghazi Time) condition) AND [2] COVID symptoms (e.g., cough, fever) (Exceptions: Already seen by PCP and no new or worsening symptoms.) Disp. Time Eilene Ghazi Time) Disposition Final User 08/18/2020 9:07:53 AM See HCP within 4 Hours (or PCP triage) Yes Raenette Rover, RN, Zella Ball Disposition Overriden: Call PCP Now Override Reason: Patient's symptoms need a higher level of care Caller Disagree/Comply  Comply Caller Understands Yes PreDisposition InappropriateToAsk Care Advice Given Per Guideline SEE HCP (OR PCP TRIAGE) WITHIN 4 HOURS: * UCC: Some UCCs can  manage patients who are stable and have less serious symptoms (e.g., minor illnesses and injuries). The triager must know the [facility] capabilities before sending a patient there. If unsure, call ahead. CALL BACK IF: * You become worse * STAY HOME A MINIMUM OF 5 DAYS: Home isolation is needed for at least 5 days after the symptoms started. Stay home from school or work if you are sick. Do NOT go to religious services, child care centers, shopping, or other public places. Do NOT use public transportation (e.g., bus, taxis, ride-sharing). Do NOT allow any visitors to your home. Leave the house only if you need to seek urgent medical care. * WEAR A MASK FOR 10 DAYS: Wear a well-fitted mask for 10 full days any time you are around others inside your home or in public. Do not go to places where you are unable to wear a mask. Comments User: Wilson Singer, RN Date/Time Eilene Ghazi Time): 08/18/2020 9:00:54 AM Day 2 of symptoms User: Wilson Singer, RN Date/Time Eilene Ghazi Time): 08/18/2020 9:01:51 AM double amputee User: Wilson Singer, RN Date/Time Eilene Ghazi Time): 08/18/2020 9:09:29 AM Higher level of care. Due to canidate for Paxlovid for covid. User: Wilson Singer, RN Date/Time Eilene Ghazi Time): 08/18/2020 9:12:54 AM No appt remaining in office and they do not see covid patients in the office. Recommended UC. PLEASE NOTE: All timestamps contained within this report are represented as [place] Standard Time. CONFIDENTIALTY NOTICE: This fax transmission is intended only for the addressee. It contains information that is legally privileged, confidential or otherwise protected from use or disclosure. If you are not the intended recipient, you are strictly prohibited from reviewing, disclosing, copying using or disseminating any of this information or taking any  action in reliance on or regarding this information. If you have received this fax in error, please notify us immediately by telephone so that we can arrange for its return to [place]. Phone: [phone], Toll-Free: [phone], Fax: [phone] Page: 3 of 3 Call Id: [id number] Referrals GO TO FACILITY UNDECIDED

## 2020-08-21 NOTE — Telephone Encounter (Signed)
Left message to call office. The UC visit was canceled. Not sure if he was seen somewhere out of the [facility] system.

## 2020-08-28 ENCOUNTER — Ambulatory Visit (HOSPITAL_COMMUNITY): Payer: Medicare Other

## 2020-08-28 ENCOUNTER — Ambulatory Visit (HOSPITAL_COMMUNITY): Payer: Medicare Other | Admitting: Anesthesiology

## 2020-08-28 ENCOUNTER — Ambulatory Visit (HOSPITAL_COMMUNITY)
Admission: RE | Admit: 2020-08-28 | Discharge: 2020-08-28 | Disposition: A | Discharge status: Home / Self Care | Payer: Medicare Other | Attending: Urology | Admitting: Urology

## 2020-08-28 ENCOUNTER — Encounter (HOSPITAL_COMMUNITY)
Admission: RE | Disposition: A | Discharge status: Home / Self Care | Payer: Self-pay | Source: Home / Self Care | Attending: Urology

## 2020-08-28 ENCOUNTER — Ambulatory Visit (HOSPITAL_COMMUNITY): Payer: Medicare Other | Admitting: Physician Assistant

## 2020-08-28 ENCOUNTER — Encounter (HOSPITAL_COMMUNITY): Payer: Self-pay | Admitting: Urology

## 2020-08-28 DIAGNOSIS — Z820 Family history of epilepsy and other diseases of the nervous system: Secondary | ICD-10-CM | POA: Diagnosis not present

## 2020-08-28 DIAGNOSIS — Z955 Presence of coronary angioplasty implant and graft: Secondary | ICD-10-CM | POA: Insufficient documentation

## 2020-08-28 DIAGNOSIS — Z7982 Long term (current) use of aspirin: Secondary | ICD-10-CM | POA: Insufficient documentation

## 2020-08-28 DIAGNOSIS — Z951 Presence of aortocoronary bypass graft: Secondary | ICD-10-CM | POA: Insufficient documentation

## 2020-08-28 DIAGNOSIS — Z801 Family history of malignant neoplasm of trachea, bronchus and lung: Secondary | ICD-10-CM | POA: Diagnosis not present

## 2020-08-28 DIAGNOSIS — Z87442 Personal history of urinary calculi: Secondary | ICD-10-CM | POA: Diagnosis not present

## 2020-08-28 DIAGNOSIS — E1151 Type 2 diabetes mellitus with diabetic peripheral angiopathy without gangrene: Secondary | ICD-10-CM | POA: Diagnosis not present

## 2020-08-28 DIAGNOSIS — Z8546 Personal history of malignant neoplasm of prostate: Secondary | ICD-10-CM | POA: Insufficient documentation

## 2020-08-28 DIAGNOSIS — Z8249 Family history of ischemic heart disease and other diseases of the circulatory system: Secondary | ICD-10-CM | POA: Insufficient documentation

## 2020-08-28 DIAGNOSIS — I10 Essential (primary) hypertension: Secondary | ICD-10-CM | POA: Diagnosis not present

## 2020-08-28 DIAGNOSIS — Z7984 Long term (current) use of oral hypoglycemic drugs: Secondary | ICD-10-CM | POA: Diagnosis not present

## 2020-08-28 DIAGNOSIS — Z87891 Personal history of nicotine dependence: Secondary | ICD-10-CM | POA: Diagnosis not present

## 2020-08-28 DIAGNOSIS — E1142 Type 2 diabetes mellitus with diabetic polyneuropathy: Secondary | ICD-10-CM | POA: Diagnosis not present

## 2020-08-28 DIAGNOSIS — Z794 Long term (current) use of insulin: Secondary | ICD-10-CM | POA: Diagnosis not present

## 2020-08-28 DIAGNOSIS — Z89612 Acquired absence of left leg above knee: Secondary | ICD-10-CM | POA: Insufficient documentation

## 2020-08-28 DIAGNOSIS — Z79899 Other long term (current) drug therapy: Secondary | ICD-10-CM | POA: Diagnosis not present

## 2020-08-28 DIAGNOSIS — Z888 Allergy status to other drugs, medicaments and biological substances status: Secondary | ICD-10-CM | POA: Diagnosis not present

## 2020-08-28 DIAGNOSIS — Z89611 Acquired absence of right leg above knee: Secondary | ICD-10-CM | POA: Insufficient documentation

## 2020-08-28 DIAGNOSIS — N201 Calculus of ureter: Secondary | ICD-10-CM | POA: Insufficient documentation

## 2020-08-28 HISTORY — PX: CYSTOSCOPY/URETEROSCOPY/HOLMIUM LASER/STENT PLACEMENT: SHX6546

## 2020-08-28 LAB — GLUCOSE, CAPILLARY
Glucose-Capillary: 129 mg/dL — ABNORMAL HIGH (ref 70–99)
Glucose-Capillary: 83 mg/dL (ref 70–99)

## 2020-08-28 SURGERY — CYSTOSCOPY/URETEROSCOPY/HOLMIUM LASER/STENT PLACEMENT
Anesthesia: General | Laterality: Left

## 2020-08-28 MED ORDER — EPHEDRINE SULFATE-NACL 50-0.9 MG/10ML-% IV SOSY
PREFILLED_SYRINGE | INTRAVENOUS | Status: DC | PRN
  Administered 2020-08-28 (×4): 5 mg via INTRAVENOUS

## 2020-08-28 MED ORDER — PROMETHAZINE HCL 25 MG/ML IJ SOLN: 6.2500 mg | INTRAMUSCULAR | Status: DC | PRN

## 2020-08-28 MED ORDER — LIDOCAINE 2% (20 MG/ML) 5 ML SYRINGE
INTRAMUSCULAR | Status: DC | PRN
  Administered 2020-08-28: 100 mg via INTRAVENOUS

## 2020-08-28 MED ORDER — ONDANSETRON HCL 4 MG/2ML IJ SOLN
INTRAMUSCULAR | Status: AC
  Filled 2020-08-28: qty 6

## 2020-08-28 MED ORDER — DEXAMETHASONE SODIUM PHOSPHATE 4 MG/ML IJ SOLN
INTRAMUSCULAR | Status: DC | PRN
  Administered 2020-08-28: 5 mg via INTRAVENOUS

## 2020-08-28 MED ORDER — AMISULPRIDE (ANTIEMETIC) 5 MG/2ML IV SOLN: 10.0000 mg | Freq: Once | INTRAVENOUS | Status: DC | PRN

## 2020-08-28 MED ORDER — DEXAMETHASONE SODIUM PHOSPHATE 10 MG/ML IJ SOLN
INTRAMUSCULAR | Status: AC
  Filled 2020-08-28: qty 2

## 2020-08-28 MED ORDER — ORAL CARE MOUTH RINSE: 15.0000 mL | Freq: Once | OROMUCOSAL | Status: AC

## 2020-08-28 MED ORDER — CELECOXIB 200 MG PO CAPS
200.0000 mg | ORAL_CAPSULE | Freq: Once | ORAL | Status: AC
  Administered 2020-08-28: 200 mg via ORAL
  Filled 2020-08-28: qty 1

## 2020-08-28 MED ORDER — LACTATED RINGERS IV SOLN
INTRAVENOUS | Status: DC
Start: 2020-08-28 — End: 2020-08-28

## 2020-08-28 MED ORDER — IOHEXOL 300 MG/ML  SOLN
INTRAMUSCULAR | Status: DC | PRN
  Administered 2020-08-28: 10 mL via URETHRAL

## 2020-08-28 MED ORDER — ACETAMINOPHEN 500 MG PO TABS
1000.0000 mg | ORAL_TABLET | Freq: Once | ORAL | Status: AC
  Administered 2020-08-28: 1000 mg via ORAL
  Filled 2020-08-28: qty 2

## 2020-08-28 MED ORDER — PROPOFOL 10 MG/ML IV BOLUS
INTRAVENOUS | Status: DC | PRN
  Administered 2020-08-28: 130 mg via INTRAVENOUS

## 2020-08-28 MED ORDER — PHENYLEPHRINE HCL-NACL 20-0.9 MG/250ML-% IV SOLN
INTRAVENOUS | Status: DC | PRN
  Administered 2020-08-28: 50 ug/min via INTRAVENOUS

## 2020-08-28 MED ORDER — SODIUM CHLORIDE 0.9 % IV SOLN
2.0000 g | Freq: Once | INTRAVENOUS | Status: AC
  Administered 2020-08-28: 2 g via INTRAVENOUS
  Filled 2020-08-28: qty 20

## 2020-08-28 MED ORDER — PHENYLEPHRINE 40 MCG/ML (10ML) SYRINGE FOR IV PUSH (FOR BLOOD PRESSURE SUPPORT)
PREFILLED_SYRINGE | INTRAVENOUS | Status: DC | PRN
  Administered 2020-08-28 (×2): 200 ug via INTRAVENOUS

## 2020-08-28 MED ORDER — FENTANYL CITRATE (PF) 100 MCG/2ML IJ SOLN: 25.0000 ug | INTRAMUSCULAR | Status: DC | PRN

## 2020-08-28 MED ORDER — FENTANYL CITRATE (PF) 100 MCG/2ML IJ SOLN
INTRAMUSCULAR | Status: DC | PRN
  Administered 2020-08-28: 100 ug via INTRAVENOUS

## 2020-08-28 MED ORDER — TRAMADOL HCL 50 MG PO TABS: 50.0000 mg | ORAL_TABLET | Freq: Four times a day (QID) | ORAL | 0 refills | Status: DC | PRN

## 2020-08-28 MED ORDER — ONDANSETRON HCL 4 MG/2ML IJ SOLN
INTRAMUSCULAR | Status: DC | PRN
  Administered 2020-08-28: 4 mg via INTRAVENOUS

## 2020-08-28 MED ORDER — SODIUM CHLORIDE 0.9 % IR SOLN
Status: DC | PRN
  Administered 2020-08-28 (×2): 3000 mL

## 2020-08-28 MED ORDER — CHLORHEXIDINE GLUCONATE 0.12 % MT SOLN
15.0000 mL | Freq: Once | OROMUCOSAL | Status: AC
  Administered 2020-08-28: 15 mL via OROMUCOSAL

## 2020-08-28 MED ORDER — FENTANYL CITRATE (PF) 100 MCG/2ML IJ SOLN
INTRAMUSCULAR | Status: AC
  Filled 2020-08-28: qty 2

## 2020-08-28 MED ORDER — ROCURONIUM BROMIDE 10 MG/ML (PF) SYRINGE
PREFILLED_SYRINGE | INTRAVENOUS | Status: AC
  Filled 2020-08-28: qty 10

## 2020-08-28 SURGICAL SUPPLY — 21 items
BAG URO CATCHER STRL LF (MISCELLANEOUS) ×2 IMPLANT
BASKET ZERO TIP NITINOL 2.4FR (BASKET) IMPLANT
CATH INTERMIT  6FR 70CM (CATHETERS) IMPLANT
CLOTH BEACON ORANGE TIMEOUT ST (SAFETY) ×2 IMPLANT
EXTRACTOR STONE NITINOL NGAGE (UROLOGICAL SUPPLIES) ×2 IMPLANT
GLOVE SURG ENC TEXT LTX SZ7.5 (GLOVE) ×2 IMPLANT
GOWN STRL REUS W/TWL LRG LVL3 (GOWN DISPOSABLE) ×2 IMPLANT
GUIDEWIRE STR DUAL SENSOR (WIRE) ×2 IMPLANT
GUIDEWIRE ZIPWRE .038 STRAIGHT (WIRE) IMPLANT
IV NS 1000ML (IV SOLUTION) ×2
IV NS 1000ML BAXH (IV SOLUTION) ×1 IMPLANT
KIT TURNOVER KIT A (KITS) ×2 IMPLANT
LASER FIB FLEXIVA PULSE ID 365 (Laser) IMPLANT
MANIFOLD NEPTUNE II (INSTRUMENTS) ×2 IMPLANT
PACK CYSTO (CUSTOM PROCEDURE TRAY) ×2 IMPLANT
SHEATH URETERAL 12FRX35CM (MISCELLANEOUS) ×2 IMPLANT
STENT URET 6FRX26 CONTOUR (STENTS) ×2 IMPLANT
TRACTIP FLEXIVA PULS ID 200XHI (Laser) ×1 IMPLANT
TRACTIP FLEXIVA PULSE ID 200 (Laser) ×2
TUBING CONNECTING 10 (TUBING) ×2 IMPLANT
TUBING UROLOGY SET (TUBING) ×2 IMPLANT

## 2020-08-28 NOTE — Progress Notes (Signed)
Pt states he forgot cpap hose and mask but friend [name] can retreive from home if needed.

## 2020-08-28 NOTE — Transfer of Care (Signed)
Immediate Anesthesia Transfer of Care Note  Patient: [name]  Procedure(s) Performed: CYSTOSCOPY/RETROGRADE/URETEROSCOPY/HOLMIUM LASER/STENT PLACEMENT (Left)  Patient Location: PACU  Anesthesia Type:General  Level of Consciousness: awake  Airway & Oxygen Therapy: Patient Spontanous Breathing and Patient connected to face mask oxygen  Post-op Assessment: Report given to RN and Post -op Vital signs reviewed and stable  Post vital signs: Reviewed and stable  Last Vitals:  Vitals Value Taken Time  BP 160/76 08/28/20 1730  Temp    Pulse 87 08/28/20 1731  Resp 17 08/28/20 1731  SpO2 100 % 08/28/20 1731  Vitals shown include unvalidated device data.  Last Pain:  Vitals:   08/28/20 1120  TempSrc: Oral  PainSc: 0-No pain      Patients Stated Pain Goal: 3 ([id number])  Complications: No notable events documented.

## 2020-08-28 NOTE — Anesthesia Procedure Notes (Signed)
Procedure Name: LMA Insertion Date/Time: 08/28/2020 3:50 PM Performed by: Rosaland Lao, CRNA Pre-anesthesia Checklist: Patient identified, Emergency Drugs available, Suction available and Patient being monitored Patient Re-evaluated:Patient Re-evaluated prior to induction Oxygen Delivery Method: Circle system utilized Preoxygenation: Pre-oxygenation with 100% oxygen Induction Type: IV induction LMA: LMA inserted LMA Size: 5.0 Number of attempts: 1 Tube secured with: Tape Dental Injury: Teeth and Oropharynx as per pre-operative assessment

## 2020-08-28 NOTE — Telephone Encounter (Signed)
2nd attempt to call pt. Left message to call office.

## 2020-08-28 NOTE — H&P (Signed)
H&P  Chief Complaint: Left proximal ureteral stone  History of Present Illness: [name] is a 70 y.o. year old with a large left proximal ureteral stone.  He recently presented for ureteroscopic treatment but only underwent stent placement due to the presence of infection at that time.  His repeat UA is now negative and he presents for definitve stone management with ureteroscopy.  Past Medical History:  Diagnosis Date   Basal cell carcinoma 02/23/2019   nodular/infiltrative- right forearm-mohs   Cellulitis and abscess of left lower extremity 12/20/2017   Coronary atherosclerosis of unspecified type of vessel, native or graft    Diabetes mellitus without complication ([place])    Hemorrhage of rectum and anus    History of kidney stones    passed stones   Hypertension    Impotence of organic origin    Internal hemorrhoids without mention of complication    Malignant neoplasm of prostate ([place])    Mononeuritis of unspecified site    Osteoarthrosis, unspecified whether generalized or localized, unspecified site    Other and unspecified hyperlipidemia    Peripheral vascular disease ([place])    B BKA   Personal history of colonic polyps    Personal history of gallstones    Subacute osteomyelitis, right ankle and foot ([place])    Type II or unspecified type diabetes mellitus with neurological manifestations, not stated as uncontrolled(250.60)    Unspecified glaucoma(365.9)    Unspecified sleep apnea    cpcp    Past Surgical History:  Procedure Laterality Date   AMPUTATION Left 12/30/2012   Procedure: AMPUTATION RAY ;  Surgeon: Meredith Pel, MD;  Location: [facility];  Service: Orthopedics;  Laterality: Left;  LEFT GREAT TOE RAY AMPUTATION   AMPUTATION Left 02/23/2016   Procedure: Left Below Knee Amputation;  Surgeon: [name], MD;  Location: [place];  Service: Orthopedics;  Laterality: Left;   AMPUTATION Left 12/24/2017   Procedure: LEFT ABOVE KNEE AMPUTATION;  Surgeon: [name], MD;  Location: [place];  Service: Orthopedics;  Laterality: Left;   AMPUTATION Right 12/24/2017   Procedure: RIGHT 5TH RAY AMPUTATION;  Surgeon: [name], MD;  Location: [place];  Service: Orthopedics;  Laterality: Right;   AMPUTATION Right 12/11/2018   Procedure: RIGHT ABOVE KNEE AMPUTATION;  Surgeon: [name], MD;  Location: [place];  Service: Orthopedics;  Laterality: Right;   APPENDECTOMY     BASAL CELL CARCINOMA EXCISION  2/16   left forearm   [place]   Negative   CATARACT EXTRACTION Right 2017   then lid surgery   COLONOSCOPY     COLONOSCOPY WITH PROPOFOL N/A 04/29/2019   Procedure: COLONOSCOPY WITH PROPOFOL;  Surgeon: Jackquline Denmark, MD;  Location: [facility];  Service: Endoscopy;  Laterality: N/A;   CORONARY ARTERY BYPASS GRAFT  09/2005   5 bypasses per patient, Post op AFIB   CYSTOSCOPY/URETEROSCOPY/HOLMIUM LASER/STENT PLACEMENT Left 08/03/2020   Procedure: CYSTOSCOPY/ RETROGRADE/STENT PLACEMENT;  Surgeon: Raynelle Bring, MD;  Location: [facility];  Service: Urology;  Laterality: Left;   ESOPHAGOGASTRODUODENOSCOPY (EGD) WITH PROPOFOL N/A 04/29/2019   Procedure: ESOPHAGOGASTRODUODENOSCOPY (EGD) WITH PROPOFOL;  Surgeon: Jackquline Denmark, MD;  [facility];  Service: Endoscopy;  Laterality: N/A;   EYE SURGERY     FOOT BONE EXCISION Left 11/03/2013   DR DUDA    I & D EXTREMITY Left 11/03/2013   Procedure: Left Foot Partial Bone Excision Cuboid and Medial Cuneiform, Wound  Closures;  Surgeon: [name], MD;  Location: [place];  Service: Orthopedics;  Laterality: Left;   INSERTION PROSTATE RADIATION SEED  2009   RT and seeds for prostate cancer   KIDNEY STONE SURGERY  04/1993   RCA stents  04/2003   EF 55%   RETINAL DETACHMENT SURGERY  2002-2003   thrombosed vein  1993   Right leg    Home Medications:  No current facility-administered medications on file prior to encounter.   Current Outpatient Medications on File Prior  to Encounter  Medication Sig Dispense Refill   aspirin EC 81 MG tablet Take 81 mg by mouth daily. Swallow whole.     carvedilol (COREG) 25 MG tablet TAKE 1 TABLET TWICE DAILY (Patient taking differently: Take 25 mg by mouth in the morning and at bedtime.) 180 tablet 3   cefdinir (OMNICEF) 300 MG capsule Take 1 capsule (300 mg total) by mouth 2 (two) times daily. 20 capsule 0   insulin glargine, 2 Unit Dial, (TOUJEO MAX SOLOSTAR) 300 UNIT/ML Solostar Pen Inject 66 Units into the skin at bedtime. 18 mL 1   metFORMIN (GLUCOPHAGE) 1000 MG tablet Take 2 tablets (2,000 mg total) by mouth daily with supper. 180 tablet 3   mupirocin ointment (BACTROBAN) 2 % APPLY TO THE AFFECTED AREA TOPICALLY DAILY (Patient taking differently: Apply 1 application topically daily as needed (irritation).) 22 g 1   NOVOLOG FLEXPEN 100 UNIT/ML FlexPen INJECT 25-30 UNITS INTO THE SKIN 3 (THREE) TIMES DAILY WITH MEALS. SLIDING SCALE INSULIN (Patient taking differently: Inject 18-25 Units into the skin 3 (three) times daily with meals. Sliding Scale Insulin) 75 mL 1   ondansetron (ZOFRAN) 4 MG tablet Take 4 mg by mouth every 6 (six) hours as needed for nausea/vomiting.     tamsulosin (FLOMAX) 0.4 MG CAPS capsule Take 0.4 mg by mouth at bedtime.     ACCU-CHEK GUIDE test strip USE AS DIRECTED TO CHECK BLOOD SUGAR 3 TIMES DAILY 300 strip 2   DROPLET PEN NEEDLES 31G X 5 MM MISC USE TO INJECT INSULIN FOUR TIMES DAILY AS INSTRUCTED (FOR DIABETES) 400 each 11   Multiple Vitamin (MULTIVITAMIN WITH MINERALS) TABS tablet Take 1 tablet by mouth daily. Centrum Silver for Men 50+ (Patient not taking: Reported on 08/08/2020)     oxyCODONE-acetaminophen (PERCOCET/ROXICET) 5-325 MG tablet Take 1 tablet by mouth every 4 (four) hours as needed for pain.       Allergies:  Allergies  Allergen Reactions   Atorvastatin Other (See Comments)    myalgias   Ezetimibe Other (See Comments)    Body ache   Simvastatin Other (See Comments)    Body ache     Family History  Problem Relation Age of Onset   Lung cancer Father    Multiple sclerosis Mother    Heart attack Other        paternal aunts and uncles   Peripheral vascular disease Maternal Grandfather        several amputations    Social History:  reports that he quit smoking about 19 years ago. His smoking use included cigars and cigarettes. He has a 70.00 pack-year smoking history. He has never used smokeless tobacco. He reports that he does not currently use alcohol. He reports that he does not use drugs.  ROS: A complete review of systems was performed.  All systems are negative except for pertinent findings as noted.  Physical Exam:  Vital signs in last 24 hours:   Constitutional:  Alert  and oriented, No acute distress Cardiovascular: Regular rate and rhythm, No JVD Respiratory: Normal respiratory effort, Lungs clear bilaterally GI: Abdomen is soft, nontender, nondistended, no abdominal masses GU: No CVA tenderness Lymphatic: No lymphadenopathy Neurologic: Grossly intact, no focal deficits Psychiatric: Normal mood and affect   Laboratory Data:  No results for input(s): WBC, HGB, HCT, PLT in the last 72 hours.  No results for input(s): NA, K, CL, GLUCOSE, BUN, CALCIUM, CREATININE in the last 72 hours.  Invalid input(s): CO3   Results for orders placed or performed during the hospital encounter of 08/28/20 (from the past 24 hour(s))  Glucose, capillary     Status: Abnormal   Collection Time: 08/28/20 11:21 AM  Result Value Ref Range   Glucose-Capillary 129 (H) 70 - 99 mg/dL   Comment 1 Notify RN    No results found for this or any previous visit (from the past 240 hour(s)).  Renal Function: No results for input(s): CREATININE in the last 168 hours. CrCl cannot be calculated (Unknown ideal weight.).  Radiologic Imaging: No results found.  Impression/Assessment:  Left ureteral calculus  Plan:  Cystoscopy with left ureteroscopic laser lithotripsy and  stone removal, ureteral stent.  I discussed the potential benefits and risks of the procedure, side effects of the proposed treatment, the likelihood of the patient achieving the goals of the procedure, and any potential problems that might occur during the procedure or recuperation.    Dutch Gray 08/28/2020, 11:44 AM  Pryor Curia MD

## 2020-08-28 NOTE — Anesthesia Postprocedure Evaluation (Signed)
Anesthesia Post Note  Patient: [name]  Procedure(s) Performed: CYSTOSCOPY/RETROGRADE/URETEROSCOPY/HOLMIUM LASER/STENT PLACEMENT (Left)     Patient location during evaluation: PACU Anesthesia Type: General Level of consciousness: sedated Pain management: pain level controlled Vital Signs Assessment: post-procedure vital signs reviewed and stable Respiratory status: spontaneous breathing and respiratory function stable Cardiovascular status: stable Postop Assessment: no apparent nausea or vomiting Anesthetic complications: no   No notable events documented.  Last Vitals:  Vitals:   08/28/20 1806 08/28/20 1828  BP: (!) 152/67 (!) 161/74  Pulse: 91 86  Resp: 16 12  Temp: 37.1 C 37 C  SpO2: 96% 94%    Last Pain:  Vitals:   08/28/20 1828  TempSrc:   PainSc: 0-No pain                 Rhys Lichty DANIEL

## 2020-08-28 NOTE — Op Note (Signed)
Preoperative diagnosis: Left ureteral calculus  Postoperative diagnosis: Left ureteral calculus  Procedure:  Cystoscopy Left ureteroscopy and stone removal Ureteroscopic laser lithotripsy Left ureteral stent placement (6 x 26 with string) Left retrograde pyelography with interpretation  Surgeon: Pryor Curia. M.D.  Anesthesia: General  Complications: None  Intraoperative findings: Left retrograde pyelography demonstrated a filling defect within the mid left ureter consistent with the patient's known calculus without other abnormalities noted.  EBL: Minimal  Specimens: Left ureteral calculus  Disposition of specimens: [facility] for stone analysis  Indication: [name]  is a 70 y.o. patient with urolithiasis.  He recently presented with an obstructing large proximal left ureteral calculus.  He was scheduled for ureteroscopy a couple weeks ago but was found to have evidence of infection at the time of his procedure and instead underwent stent placement only.  He was treated with antibiotics and presents today for definitive stone management after resolution of his infection.  After reviewing the management options for treatment, they elected to proceed with the above surgical procedure(s). We have discussed the potential benefits and risks of the procedure, side effects of the proposed treatment, the likelihood of the patient achieving the goals of the procedure, and any potential problems that might occur during the procedure or recuperation. Informed consent has been obtained.  Description of procedure:  The patient was taken to the operating room and general anesthesia was induced.  The patient was placed in the dorsal lithotomy position, prepped and draped in the usual sterile fashion, and preoperative antibiotics were administered. A preoperative time-out was performed.   Cystourethroscopy was performed.  The patient's urethra was examined and was  normal. The bladder was then systematically examined in its entirety. There was no evidence for any bladder tumors, stones, or other mucosal pathology.    Attention then turned to the left ureteral orifice and his indwelling left ureteral stent was removed.  A ureteral catheter was used to intubate the ureteral orifice.  Omnipaque contrast was injected through the ureteral catheter and a retrograde pyelogram was performed with findings as dictated above.  A 0.38 sensor guidewire was then advanced up the left ureter into the renal pelvis under fluoroscopic guidance.  I then used the 6 French semirigid ureteroscope and was able to get up to the stone but was not able to treat the stone which was just above the iliac vessels.  I therefore removed the semirigid scope and instead placed a 12/14 Fr ureteral access sheath was then advanced over the guide wire. The digital flexible ureteroscope was then advanced through the access sheath into the ureter next to the guidewire and the calculus was identified and was located in the mid ureter.   The stone was then fragmented with the 200 micron holmium laser fiber on a setting of 0.2 J and frequency of 70 hz.   All sizable stones were then removed with an N gage basket.  Reinspection of the ureter/renal pelvis revealed no remaining visible stones or fragments of significant size.   The safety wire was then replaced and the access sheath removed.  The guidewire was backloaded through the cystoscope and a ureteral stent was advance over the wire using Seldinger technique.  The stent was positioned appropriately under fluoroscopic and cystoscopic guidance.  The wire was then removed with an adequate stent curl noted in the renal pelvis as well as in the bladder.  The bladder was then emptied and the procedure ended.  The patient appeared  to tolerate the procedure well and without complications.  The patient was able to be awakened and transferred to the recovery unit  in satisfactory condition.   Pryor Curia MD

## 2020-08-28 NOTE — Discharge Instructions (Addendum)
You may see some blood in the urine and may have some burning with urination for 48-72 hours. You also may notice that you have to urinate more frequently or urgently after your procedure which is normal.  You should call should you develop an inability urinate, fever > 101, persistent nausea and vomiting that prevents you from eating or drinking to stay hydrated.  If you have a stent, you will likely urinate more frequently and urgently until the stent is removed and you may experience some discomfort/pain in the lower abdomen and flank especially when urinating. You may take pain medication prescribed to you if needed for pain. You may also intermittently have blood in the urine until the stent is removed. .You may remove your stent on Friday morning.  Simply pull the string that is taped to your body and the stent will easily come out.  This may be best done in the shower as some urine may come out with the stent.  Usually you will feel relief once the stent is removed, but occasionally patients can develop pain due to residual swelling of the ureter that may temporarily obstruct the kidney.  This can be managed by taking pain medication and it will typically resolve with time.  Please do not hesitate to call if you have pain that is not controlled with your pain medication or does not improved within 24-48 hours.   

## 2020-08-28 NOTE — Anesthesia Preprocedure Evaluation (Addendum)
Anesthesia Evaluation  Patient identified by MRN, date of birth, ID band Patient awake    Reviewed: Allergy & Precautions, NPO status , Patient's Chart, lab work & pertinent test results  History of Anesthesia Complications Negative for: history of anesthetic complications  Airway Mallampati: II  TM Distance: >3 FB Neck ROM: Full    Dental  (+) Poor Dentition, Dental Advisory Given,    Pulmonary sleep apnea and Continuous Positive Airway Pressure Ventilation , former smoker,    Pulmonary exam normal  + decreased breath sounds(-) wheezing      Cardiovascular hypertension, Pt. on medications and Pt. on home beta blockers + CAD, + CABG and + Peripheral Vascular Disease  Normal cardiovascular exam  CABG x 5 2009   Neuro/Psych Diabetic peripheral neuropathy  Neuromuscular disease negative psych ROS   GI/Hepatic negative GI ROS, Neg liver ROS,   Endo/Other  diabetes, Well Controlled, Type 2, Oral Hypoglycemic Agents, Insulin DependentObesity  Renal/GU Left ureteral calculus  negative genitourinary   Musculoskeletal  (+) Arthritis , Osteoarthritis,  S/P Bilateral AKA   Abdominal   Peds  Hematology negative hematology ROS (+)   Anesthesia Other Findings   Reproductive/Obstetrics                            Anesthesia Physical  Anesthesia Plan  ASA: 3  Anesthesia Plan: General   Post-op Pain Management:    Induction: Intravenous  PONV Risk Score and Plan: 4 or greater and Treatment may vary due to age or medical condition, Ondansetron and Dexamethasone  Airway Management Planned: LMA  Additional Equipment:   Intra-op Plan:   Post-operative Plan: Extubation in OR  Informed Consent: I have reviewed the patients History and Physical, chart, labs and discussed the procedure including the risks, benefits and alternatives for the proposed anesthesia with the patient or authorized  representative who has indicated his/her understanding and acceptance.     Dental advisory given  Plan Discussed with: Anesthesiologist and CRNA  Anesthesia Plan Comments:        Anesthesia Quick Evaluation

## 2020-08-28 NOTE — Interval H&P Note (Deleted)
History and Physical Interval Note:  08/28/2020 11:42 AM  He recently presented for treatment of his stone but required stent placement due to infection.  His repeat UA is now clear and he presents for definitive stone treatment of his large left proximal ureteral stone.  [name]  has presented today for surgery, with the diagnosis of LEFT URETERAL CALCULUS.  The various methods of treatment have been discussed with the patient and family. After consideration of risks, benefits and other options for treatment, the patient has consented to  Procedure(s): CYSTOSCOPY/RETROGRADE/URETEROSCOPY/HOLMIUM LASER/STENT PLACEMENT (Left) as a surgical intervention.  The patient's history has been reviewed, patient examined, no change in status, stable for surgery.  I have reviewed the patient's chart and labs.  Questions were answered to the patient's satisfaction.     [employer]

## 2020-08-29 ENCOUNTER — Encounter (HOSPITAL_COMMUNITY): Payer: Self-pay | Admitting: Urology

## 2020-08-29 DIAGNOSIS — N201 Calculus of ureter: Secondary | ICD-10-CM | POA: Diagnosis not present

## 2020-08-31 NOTE — Telephone Encounter (Signed)
Pt called back. He said he is feeling better from Covid.

## 2020-08-31 NOTE — Telephone Encounter (Signed)
3rd call to see how pt is doing. Left message.

## 2020-09-12 DIAGNOSIS — C61 Malignant neoplasm of prostate: Secondary | ICD-10-CM | POA: Diagnosis not present

## 2020-09-12 DIAGNOSIS — N201 Calculus of ureter: Secondary | ICD-10-CM | POA: Diagnosis not present

## 2020-09-18 DIAGNOSIS — E278 Other specified disorders of adrenal gland: Secondary | ICD-10-CM | POA: Diagnosis not present

## 2020-09-18 DIAGNOSIS — K5730 Diverticulosis of large intestine without perforation or abscess without bleeding: Secondary | ICD-10-CM | POA: Diagnosis not present

## 2020-09-18 DIAGNOSIS — R8271 Bacteriuria: Secondary | ICD-10-CM | POA: Diagnosis not present

## 2020-09-18 DIAGNOSIS — K8020 Calculus of gallbladder without cholecystitis without obstruction: Secondary | ICD-10-CM | POA: Diagnosis not present

## 2020-09-18 DIAGNOSIS — R1084 Generalized abdominal pain: Secondary | ICD-10-CM | POA: Diagnosis not present

## 2020-09-18 DIAGNOSIS — N3000 Acute cystitis without hematuria: Secondary | ICD-10-CM | POA: Diagnosis not present

## 2020-09-18 DIAGNOSIS — N200 Calculus of kidney: Secondary | ICD-10-CM | POA: Diagnosis not present

## 2020-09-22 DIAGNOSIS — N3000 Acute cystitis without hematuria: Secondary | ICD-10-CM | POA: Diagnosis not present

## 2020-09-22 DIAGNOSIS — R1084 Generalized abdominal pain: Secondary | ICD-10-CM | POA: Diagnosis not present

## 2020-11-02 ENCOUNTER — Emergency Department (HOSPITAL_COMMUNITY)
Admission: EM | Admit: 2020-11-02 | Discharge: 2020-11-03 | Disposition: A | Discharge status: Home / Self Care | Payer: Medicare Other | Attending: Emergency Medicine | Admitting: Emergency Medicine

## 2020-11-02 ENCOUNTER — Encounter (HOSPITAL_COMMUNITY): Payer: Self-pay

## 2020-11-02 ENCOUNTER — Emergency Department (HOSPITAL_COMMUNITY): Payer: Medicare Other

## 2020-11-02 DIAGNOSIS — Z8546 Personal history of malignant neoplasm of prostate: Secondary | ICD-10-CM | POA: Insufficient documentation

## 2020-11-02 DIAGNOSIS — K8020 Calculus of gallbladder without cholecystitis without obstruction: Secondary | ICD-10-CM | POA: Diagnosis not present

## 2020-11-02 DIAGNOSIS — Z85828 Personal history of other malignant neoplasm of skin: Secondary | ICD-10-CM | POA: Insufficient documentation

## 2020-11-02 DIAGNOSIS — E119 Type 2 diabetes mellitus without complications: Secondary | ICD-10-CM | POA: Insufficient documentation

## 2020-11-02 DIAGNOSIS — I10 Essential (primary) hypertension: Secondary | ICD-10-CM | POA: Insufficient documentation

## 2020-11-02 DIAGNOSIS — K6289 Other specified diseases of anus and rectum: Secondary | ICD-10-CM

## 2020-11-02 DIAGNOSIS — K5730 Diverticulosis of large intestine without perforation or abscess without bleeding: Secondary | ICD-10-CM | POA: Diagnosis not present

## 2020-11-02 DIAGNOSIS — Z87891 Personal history of nicotine dependence: Secondary | ICD-10-CM | POA: Diagnosis not present

## 2020-11-02 DIAGNOSIS — R197 Diarrhea, unspecified: Secondary | ICD-10-CM | POA: Diagnosis not present

## 2020-11-02 LAB — COMPREHENSIVE METABOLIC PANEL
ALT: 13 U/L (ref 0–44)
AST: 20 U/L (ref 15–41)
Albumin: 3.7 g/dL (ref 3.5–5.0)
Alkaline Phosphatase: 35 U/L — ABNORMAL LOW (ref 38–126)
Anion gap: 9 (ref 5–15)
BUN: 13 mg/dL (ref 8–23)
CO2: 21 mmol/L — ABNORMAL LOW (ref 22–32)
Calcium: 9 mg/dL (ref 8.9–10.3)
Chloride: 102 mmol/L (ref 98–111)
Creatinine, Ser: 0.86 mg/dL (ref 0.61–1.24)
GFR, Estimated: >60 mL/min (ref >60)
Glucose, Bld: 129 mg/dL — ABNORMAL HIGH (ref 70–99)
Potassium: 4 mmol/L (ref 3.5–5.1)
Sodium: 132 mmol/L — ABNORMAL LOW (ref 135–145)
Total Bilirubin: 1.5 mg/dL — ABNORMAL HIGH (ref 0.3–1.2)
Total Protein: 6.2 g/dL — ABNORMAL LOW (ref 6.5–8.1)

## 2020-11-02 LAB — CBC
HCT: 41.8 % (ref 39.0–52.0)
Hemoglobin: 14.1 g/dL (ref 13.0–17.0)
MCH: 26.5 pg (ref 26.0–34.0)
MCHC: 33.7 g/dL (ref 30.0–36.0)
MCV: 78.4 fL — ABNORMAL LOW (ref 80.0–100.0)
Platelets: 256 10*3/uL (ref 150–400)
RBC: 5.33 MIL/uL (ref 4.22–5.81)
RDW: 18.6 % — ABNORMAL HIGH (ref 11.5–15.5)
WBC: 12.3 10*3/uL — ABNORMAL HIGH (ref 4.0–10.5)
nRBC: 1.1 % — ABNORMAL HIGH (ref 0.0–0.2)

## 2020-11-02 LAB — URINALYSIS, ROUTINE W REFLEX MICROSCOPIC
Bacteria, UA: NONE SEEN
Bilirubin Urine: NEGATIVE
Glucose, UA: NEGATIVE mg/dL
Hgb urine dipstick: NEGATIVE
Ketones, ur: NEGATIVE mg/dL
Leukocytes,Ua: NEGATIVE
Nitrite: NEGATIVE
Protein, ur: 30 mg/dL — AB
Specific Gravity, Urine: 1.034 — ABNORMAL HIGH (ref 1.005–1.030)
pH: 5 (ref 5.0–8.0)

## 2020-11-02 LAB — LIPASE, BLOOD: Lipase: 28 U/L (ref 11–51)

## 2020-11-02 MED ORDER — IOHEXOL 300 MG/ML  SOLN
100.0000 mL | Freq: Once | INTRAMUSCULAR | Status: AC | PRN
  Administered 2020-11-02: 100 mL via INTRAVENOUS

## 2020-11-02 NOTE — ED Notes (Signed)
Called pt multiple times for vitals, no response.

## 2020-11-02 NOTE — ED Triage Notes (Signed)
Pt states that he was sent by his PCP for a kidney infection, reports he has been having some diarrhea and urinary incontinence for the past 3 days, denies fevers

## 2020-11-02 NOTE — ED Provider Notes (Signed)
Emergency Medicine Provider Triage Evaluation Note  [name] , a 70 y.o. male  was evaluated in triage.  Pt complains of possible kidney infection.  States he started having some dysuria 3 days ago.  He is also having loose stools without melena or blood per rectum.  He has pain to his right abdomen, right flank.  He has bilateral lower extremity amputations.  He denies any blood thinners.  No chest pain, shortness of breath, fever.  Review of Systems  Positive: Abdominal pain, diarrhea, dysuria Negative:   Physical Exam  BP (!) 163/87 (BP Location: Left Arm)   Pulse 93   Temp 98.2 F (36.8 C) (Oral)   Resp 18   SpO2 99%  Gen:   Awake, no distress   Resp:  Normal effort  MSK:   Moves extremities without difficulty, BLE amputations ABD:  Generalized right abd pain, pain to right flank Other:    Medical Decision Making  Medically screening exam initiated at 7:12 PM.  Appropriate orders placed.  De Hollingshead Deckman was informed that the remainder of the evaluation will be completed by another provider, this initial triage assessment does not replace that evaluation, and the importance of remaining in the ED until their evaluation is complete.  Diarrhea, abdominal pain, dysuria   Linas Stepter A, PA-C 11/02/20 1921    Malvin Johns, MD 11/02/20 2333

## 2020-11-03 ENCOUNTER — Telehealth: Payer: Self-pay

## 2020-11-03 DIAGNOSIS — K6289 Other specified diseases of anus and rectum: Secondary | ICD-10-CM | POA: Diagnosis not present

## 2020-11-03 MED ORDER — AMOXICILLIN-POT CLAVULANATE 875-125 MG PO TABS: 1.0000 | ORAL_TABLET | Freq: Two times a day (BID) | ORAL | 0 refills | Status: AC

## 2020-11-03 MED ORDER — AMOXICILLIN-POT CLAVULANATE 875-125 MG PO TABS
1.0000 | ORAL_TABLET | Freq: Once | ORAL | Status: AC
  Administered 2020-11-03: 1 via ORAL
  Filled 2020-11-03: qty 1

## 2020-11-03 NOTE — Discharge Instructions (Signed)
You were evaluated in the Emergency Department and after careful evaluation, we did not find any emergent condition requiring admission or further testing in the hospital.  Your exam/testing today was overall reassuring.  Symptoms may be due to an infection of the colon.  Take the antibiotics as directed and follow up closely with your PCP.  Please return to the Emergency Department if you experience any worsening of your condition.  Thank you for allowing [place] to be a part of your care.

## 2020-11-03 NOTE — Telephone Encounter (Signed)
Per chart review tab pt went to [facility] ED on 11/02/20.

## 2020-11-03 NOTE — Telephone Encounter (Signed)
[place] Day - Client TELEPHONE ADVICE RECORD [facility] Patient Name: [name] Gender: Male DOB: [birth year] Age: 70 Y 63 M 16 D Return Phone Number: [phone] (Primary), [phone] (Secondary) Address: City/ State/ [name]  [address] Client [facility] - Client Client Site [place] - Day Physician Viviana Simpler- MD Contact Type Call Who Is Calling Patient / Member / Family / Caregiver Call Type Triage / Clinical Caller Name [name] Relationship To Patient Spouse Return Phone Number [phone] (Primary) Chief Complaint Back Pain - General Reason for Call Symptomatic / Request for [place] states that he is having a lot of pain on lower pain back and right side feels like a kidney infection. Can't sit, sleep, or any momements and wants to schedule an appointment too. Translation No Nurse Assessment Nurse: Carlis Abbott, RN, [name] Date/Time (Eastern Time): 11/02/2020 4:08:37 PM Confirm and document reason for call. If symptomatic, describe symptoms. ---Caller states that patient is having lower back pain and side pain that has gotten worse this past week. He can't rest. Denies fever, blood in urine, pain with urination, or other sx. Does the patient have any new or worsening symptoms? ---Yes Will a triage be completed? ---Yes Related visit to physician within the last 2 weeks? ---No Does the PT have any chronic conditions? (i.e. diabetes, asthma, this includes High risk factors for pregnancy, etc.) ---Yes List chronic conditions. ---double lower leg amputee, Diabetes, HTN Is this a behavioral health or substance abuse call? ---No Guidelines Guideline Title Affirmed Question Affirmed Notes Nurse Date/Time (Eastern Time) Back Pain [1] Loss of bladder or bowel control (urine or bowel incontinence; wetting self, leaking stool) AND  [2] new-onset Carlis Abbott, RN, [name] 11/02/2020 4:10:49 PM PLEASE NOTE: All timestamps contained within this report are represented as [place] Standard Time. CONFIDENTIALTY NOTICE: This fax transmission is intended only for the addressee. It contains information that is legally privileged, confidential or otherwise protected from use or disclosure. If you are not the intended recipient, you are strictly prohibited from reviewing, disclosing, copying using or disseminating any of this information or taking any action in reliance on or regarding this information. If you have received this fax in error, please notify us immediately by telephone so that we can arrange for its return to [place]. Phone: [phone], Toll-Free: [phone], Fax: [phone] Page: 2 of 2 Call Id: [id number] [place]. Time Eilene Ghazi Time) Disposition Final User 11/02/2020 4:15:00 PM Go to ED Now Yes Carlis Abbott, RN, Shelly Coss Disagree/Comply Comply Caller Understands Yes PreDisposition Go to Urgent Care/Walk-In Clinic Care Advice Given Per Guideline GO TO ED NOW: * You need to be seen in the Emergency Department. * Go to the ED at ___________ [place] now. Drive carefully. BRING MEDICINES: * Bring a list of your current medicines when you go to the Emergency Department (ER). * Bring the pill bottles too. This will help the doctor (or NP/PA) to make certain you are taking the right medicines and the right dose. CARE ADVICE given per Back Pain (Adult) guideline. Referrals [facility]

## 2020-11-03 NOTE — ED Provider Notes (Signed)
[facility] Emergency Department Provider Note MRN:  [id number]  Arrival date & time: 11/03/20     Chief Complaint   Diarrhea   History of Present Illness   [name] is a 70 y.o. year-old male with a history of diabetes, peripheral artery disease presenting to the ED with chief complaint of diarrhea.  3 days of diarrhea, improving a bit today.  Subjective fevers at home.  Having some right flank pain as well.  Thought maybe he had a kidney infection.  Denies chest pain or shortness of breath.  Pain is moderate to severe, constant, no exacerbating or alleviating factors. Review of Systems  A complete 10 system review of systems was obtained and all systems are negative except as noted in the HPI and PMH.   Patient's Health History    Past Medical History:  Diagnosis Date   Basal cell carcinoma 02/23/2019   nodular/infiltrative- right forearm-mohs   Cellulitis and abscess of left lower extremity 12/20/2017   Coronary atherosclerosis of unspecified type of vessel, native or graft    Diabetes mellitus without complication ([place])    Hemorrhage of rectum and anus    History of kidney stones    passed stones   Hypertension    Impotence of organic origin    Internal hemorrhoids without mention of complication    Malignant neoplasm of prostate ([place])    Mononeuritis of unspecified site    Osteoarthrosis, unspecified whether generalized or localized, unspecified site    Other and unspecified hyperlipidemia    Peripheral vascular disease ([place])    B BKA   Personal history of colonic polyps    Personal history of gallstones    Subacute osteomyelitis, right ankle and foot ([place])    Type II or unspecified type diabetes mellitus with neurological manifestations, not stated as uncontrolled(250.60)    Unspecified glaucoma(365.9)    Unspecified sleep apnea    cpcp    Past Surgical History:  Procedure Laterality Date   AMPUTATION Left 12/30/2012   Procedure:  AMPUTATION RAY ;  Surgeon: Meredith Pel, MD;  Location: [facility];  Service: Orthopedics;  Laterality: Left;  LEFT GREAT TOE RAY AMPUTATION   AMPUTATION Left 02/23/2016   Procedure: Left Below Knee Amputation;  Surgeon: [name], MD;  Location: [place];  Service: Orthopedics;  Laterality: Left;   AMPUTATION Left 12/24/2017   Procedure: LEFT ABOVE KNEE AMPUTATION;  Surgeon: [name], MD;  Location: [place];  Service: Orthopedics;  Laterality: Left;   AMPUTATION Right 12/24/2017   Procedure: RIGHT 5TH RAY AMPUTATION;  Surgeon: [name], MD;  Location: [place];  Service: Orthopedics;  Laterality: Right;   AMPUTATION Right 12/11/2018   Procedure: RIGHT ABOVE KNEE AMPUTATION;  Surgeon: [name], MD;  Location: [place];  Service: Orthopedics;  Laterality: Right;   APPENDECTOMY     BASAL CELL CARCINOMA EXCISION  2/16   left forearm   [place]   Negative   CATARACT EXTRACTION Right 2017   then lid surgery   COLONOSCOPY     COLONOSCOPY WITH PROPOFOL N/A 04/29/2019   Procedure: COLONOSCOPY WITH PROPOFOL;  Surgeon: Jackquline Denmark, MD;  Location[facility] END[facility]COPY;  Service: Endoscopy;  Laterality: N/A;   CORONARY ARTERY BYPASS GRAFT  09/2005   5 bypasses per patient, Post op AFIB   CYSTOSCOPY/URETEROSCOPY/HOLMIUM LASER/STENT PLACEMENT Left 08/03/2020   Procedure: CYSTOSCOPY/ RETROGRADE/STENT PLACEMENT;  Surgeon: Raynelle Bring, MD;  Location:  [facility];  Service: Urology;  Laterality: Left;   CYSTOSCOPY/URETEROSCOPY/HOLMIUM LASER/STENT PLACEMENT Left 08/28/2020   Procedure: CYSTOSCOPY/RETROGRADE/URETEROSCOPY/HOLMIUM LASER/STENT PLACEMENT;  Surgeon: Raynelle Bring, MD;  Location: [facility];  Service: Urology;  Laterality: Left;   ESOPHAGOGASTRODUODENOSCOPY (EGD) WITH PROPOFOL N/A 04/29/2019   Procedure: ESOPHAGOGASTRODUODENOSCOPY (EGD) WITH PROPOFOL;  Surgeon: Jackquline Denmark, MD;  Location[facility];  Service: Endoscopy;  Laterality: N/A;   EYE SURGERY      FOOT BONE EXCISION Left 11/03/2013   DR DUDA    I & D EXTREMITY Left 11/03/2013   Procedure: Left Foot Partial Bone Excision Cuboid and Medial Cuneiform, Wound Closures;  Surgeon: [name], MD;  Location: [place];  Service: Orthopedics;  Laterality: Left;   INSERTION PROSTATE RADIATION SEED  2009   RT and seeds for prostate cancer   KIDNEY STONE SURGERY  04/1993   RCA stents  04/2003   EF 55%   RETINAL DETACHMENT SURGERY  2002-2003   thrombosed vein  1993   Right leg    Family History  Problem Relation Age of Onset   Lung cancer Father    Multiple sclerosis Mother    Heart attack Other        paternal aunts and uncles   Peripheral vascular disease Maternal Grandfather        several amputations    Social History   Socioeconomic History   Marital status: Widowed    Spouse name: Not on file   Number of children: 3   Years of education: Not on file   Highest education level: Not on file  Occupational History   Occupation: Heavy equipment mechanic--retired  Tobacco Use   Smoking status: Former    Packs/day: 2.00    Years: 35.00    Pack years: 70.00    Types: 21, Cigarettes    Quit date: 01/07/2001    Years since quitting: 19.8   Smokeless tobacco: Never  Vaping Use   Vaping Use: Never used  Substance and Sexual Activity   Alcohol use: Not Currently    Comment: rare   Drug use: No   Sexual activity: Yes    Partners: Female  Other Topics Concern   Not on file  Social History Narrative   Lives with girlfriend [name] since ~2005   Has living will   Daughter [name] is [place] health care POA    Would accept resuscitation attempts--but no prolonged artificial ventilation   No tube feeds if cognitively unaware   Social Determinants of Health   Financial Resource Strain: Not on file  Food Insecurity: Not on file  Transportation Needs: Not on file  Physical Activity: Not on file  Stress: Not on file  Social Connections: Not on file  Intimate Partner  Violence: Not on file     Physical Exam   Vitals:   11/03/20 0505 11/03/20 0600  BP:  (!) 158/73  Pulse:  95  Resp:  19  Temp:    SpO2: 99% 97%    CONSTITUTIONAL: Chronically ill-appearing, NAD NEURO:  Alert and oriented x 3, no focal deficits EYES:  eyes equal and reactive ENT/NECK:  no LAD, no JVD CARDIO: Regular rate, well-perfused, normal S1 and S2 PULM:  CTAB no wheezing or rhonchi GI/GU:  normal bowel sounds, non-distended, non-tender MSK/SPINE: Bilateral leg amputations, well-appearing stumps SKIN:  no rash, atraumatic PSYCH:  Appropriate speech and behavior  *Additional and/or pertinent findings included in MDM below  Diagnostic and Interventional Summary    EKG Interpretation  Date/Time:  Ventricular Rate:    PR Interval:    QRS Duration:   QT Interval:    QTC Calculation:   R Axis:     Text Interpretation:         Labs Reviewed  COMPREHENSIVE METABOLIC PANEL - Abnormal; Notable for the following components:      Result Value   Sodium 132 (*)    CO2 21 (*)    Glucose, Bld 129 (*)    Total Protein 6.2 (*)    Alkaline Phosphatase 35 (*)    Total Bilirubin 1.5 (*)    All other components within normal limits  CBC - Abnormal; Notable for the following components:   WBC 12.3 (*)    MCV 78.4 (*)    RDW 18.6 (*)    nRBC 1.1 (*)    All other components within normal limits  URINALYSIS, ROUTINE W REFLEX MICROSCOPIC - Abnormal; Notable for the following components:   Specific Gravity, Urine 1.034 (*)    Protein, ur 30 (*)    All other components within normal limits  LIPASE, BLOOD    CT Abdomen Pelvis W Contrast  Final Result      Medications  iohexol (OMNIPAQUE) 300 MG/ML solution 100 mL (100 mLs Intravenous Contrast Given 11/02/20 2200)  amoxicillin-clavulanate (AUGMENTIN) 875-125 MG per tablet 1 tablet (1 tablet Oral Given 11/03/20 0556)     Procedures  /  Critical Care Procedures  ED Course and Medical Decision Making  I have reviewed  the triage vital signs, the nursing notes, and pertinent available records from the EMR.  Listed above are laboratory and imaging tests that I personally ordered, reviewed, and interpreted and then considered in my medical decision making (see below for details).  Differential diagnosis includes kidney stone, pyelonephritis, diverticulitis, appendicitis.  CT revealing a possible proctitis but otherwise reassuring.  Patient here in the emergency department has no systemic symptoms, well-appearing, normal vital signs, labs are reassuring.  No indication for further testing or admission, will send home on antibiotics to cover for colitis and have patient follow-up closely with PCP.       Barth Kirks. Sedonia Small, [place] [email or web address]  Final Clinical Impressions(s) / ED Diagnoses     ICD-10-CM   1. Proctitis  K62.89       ED Discharge Orders          Ordered    amoxicillin-clavulanate (AUGMENTIN) 875-125 MG tablet  Every 12 hours        11/03/20 0550             Discharge Instructions Discussed with and Provided to Patient:    Discharge Instructions      You were evaluated in the Emergency Department and after careful evaluation, we did not find any emergent condition requiring admission or further testing in the hospital.  Your exam/testing today was overall reassuring.  Symptoms may be due to an infection of the colon.  Take the antibiotics as directed and follow up closely with your PCP.  Please return to the Emergency Department if you experience any worsening of your condition.  Thank you for allowing [place] to be a part of your care.        Maudie Flakes, MD 11/03/20 [phone]

## 2020-11-06 NOTE — Telephone Encounter (Signed)
Spoke to pt. He said he is seeing some improvement but having night sweats. Although he has night sweats at times prior to this. I asked him to call us midweek if he is not seeing marked improvement. He agreed.

## 2020-11-13 ENCOUNTER — Telehealth: Payer: Self-pay | Admitting: *Deleted

## 2020-11-13 NOTE — Telephone Encounter (Signed)
Spoke to patient by telephone an was advised that he was given an antibiotic when he went to the ER 11/02/20 for diarrhea.  Patient stated that he called Saturday to let Dr. [name] know that he was getting better while on the antibiotic. Patient stated the day after he finished the antibiotic he was still having a little pain. Patient stated that he is doing much better but still has some pain before he has a bowel movement and while having one. Patient stated that the pain is not nearly as bad as it was. Patient denies a fever or any blood in his stools. Patient stated that he is wondering if he needs a few more days of antibiotic to completely get rid of the infection that he has. Patient was given ER precautions and he verbalized understanding. Pharmacy [facility]

## 2020-11-13 NOTE — Telephone Encounter (Signed)
Tried to reach patient and got his voicemail. Left a message for patient to call the office back.

## 2020-11-13 NOTE — Telephone Encounter (Signed)
Pt called returning your call 

## 2020-11-13 NOTE — Telephone Encounter (Signed)
PLEASE NOTE: All timestamps contained within this report are represented as [place] Standard Time. CONFIDENTIALTY NOTICE: This fax transmission is intended only for the addressee. It contains information that is legally privileged, confidential or otherwise protected from use or disclosure. If you are not the intended recipient, you are strictly prohibited from reviewing, disclosing, copying using or disseminating any of this information or taking any action in reliance on or regarding this information. If you have received this fax in error, please notify us immediately by telephone so that we can arrange for its return to [place]. Phone: [phone], Toll-Free: [phone], Fax: [phone] Page: 1 of 2 Call Id: [id number] [place] Night - Client TELEPHONE ADVICE RECORD AccessNurse Patient Name: [name] Gender: Male DOB: 23-Sep-1950 Age: 70 Y 68 M 25 D Return Phone Number: [phone] (Primary), [phone] (Secondary) Address: City/ State/ [place]  [address] Client [facility] - Client Client Site [place] Physician Viviana Simpler- MD Contact Type Call Who Is Calling Patient / Member / Family / Caregiver Call Type Triage / Clinical Caller Name [name] Relationship To Patient Spouse Return Phone Number [phone] (Primary) Chief Complaint Back Pain - General Reason for Call Symptomatic / Request for [place] states her husband just got off his antibiotics. Infection in his colon. Lower back and side pain. Translation No Nurse Assessment Nurse: Roselie Awkward, RN, Heather Date/Time (Eastern Time): 11/11/2020 9:06:33 AM Confirm and document reason for call. If symptomatic, describe symptoms. ---Caller states fiance is having lower back and side pain. Was seen in ED last thursday for colon infection. Was given Rx for abx. Took full course. Back  pain started at the same time of infection. Starting to flare up again after finishing abx. Temp 99. Does the patient have any new or worsening symptoms? ---Yes Will a triage be completed? ---Yes Related visit to physician within the last 2 weeks? ---Yes Does the PT have any chronic conditions? (i.e. diabetes, asthma, this includes High risk factors for pregnancy, etc.) ---Yes List chronic conditions. ---Bilat amputee, DM, CABGx5. HTN, prostate CA, kidney stone, Is this a behavioral health or substance abuse call? ---No Guidelines Guideline Title Affirmed Question Affirmed Notes Nurse Date/Time (Eastern Time) Back Pain High-risk adult (e.g., history of cancer, HIV, or IV drug use) Roselie Awkward, RN, Heather 11/11/2020 9:10:32 AM Disp. Time Eilene Ghazi Time) Disposition Final User PLEASE NOTE: All timestamps contained within this report are represented as [place] Standard Time. CONFIDENTIALTY NOTICE: This fax transmission is intended only for the addressee. It contains information that is legally privileged, confidential or otherwise protected from use or disclosure. If you are not the intended recipient, you are strictly prohibited from reviewing, disclosing, copying using or disseminating any of this information or taking any action in reliance on or regarding this information. If you have received this fax in error, please notify us immediately by telephone so that we can arrange for its return to [place]. Phone: [phone], Toll-Free: [phone], Fax: [phone] Page: 2 of 2 Call Id: [id number] 11/11/2020 9:24:17 AM See PCP within 24 Hours Yes Roselie Awkward, RN, Soyla Murphy Disagree/Comply Comply Caller Understands Yes PreDisposition Call Doctor Care Advice Given Per Guideline SEE PCP WITHIN 24 HOURS: * IF OFFICE WILL BE OPEN: You need to be examined within the next 24 hours. Call your doctor (or NP/PA) when the office opens and make an appointment. * NAPROXEN (E.G., ALEVE): Take 220 mg (one  220 mg pill) by mouth  every 8 to 12 hours as needed. You may take 440 mg (two 220 mg pills) for your first dose. The most you should take is 3 pills a day (660 mg total). Note: In [place], the maximum is 2 pills a day (one every 12 hours; 440 mg total). CALL BACK IF: * You become worse CARE ADVICE given per Back Pain (Adult) guideline. Referrals [facility] [place]

## 2020-11-13 NOTE — Telephone Encounter (Signed)
Left message on VM for pt with Dr Alla German message.

## 2020-11-16 ENCOUNTER — Other Ambulatory Visit: Payer: Self-pay

## 2020-11-16 ENCOUNTER — Encounter: Payer: Self-pay | Admitting: Internal Medicine

## 2020-11-16 ENCOUNTER — Ambulatory Visit (INDEPENDENT_AMBULATORY_CARE_PROVIDER_SITE_OTHER): Payer: Medicare Other | Admitting: Internal Medicine

## 2020-11-16 VITALS — BP 128/68 | HR 77 | Ht 74.0 in

## 2020-11-16 DIAGNOSIS — E1142 Type 2 diabetes mellitus with diabetic polyneuropathy: Secondary | ICD-10-CM

## 2020-11-16 DIAGNOSIS — E669 Obesity, unspecified: Secondary | ICD-10-CM | POA: Diagnosis not present

## 2020-11-16 DIAGNOSIS — E782 Mixed hyperlipidemia: Secondary | ICD-10-CM | POA: Diagnosis not present

## 2020-11-16 DIAGNOSIS — Z794 Long term (current) use of insulin: Secondary | ICD-10-CM

## 2020-11-16 DIAGNOSIS — Z23 Encounter for immunization: Secondary | ICD-10-CM

## 2020-11-16 LAB — POCT GLYCOSYLATED HEMOGLOBIN (HGB A1C): Hemoglobin A1C: 5.8 % — AB (ref 4.0–5.6)

## 2020-11-16 MED ORDER — TOUJEO MAX SOLOSTAR 300 UNIT/ML ~~LOC~~ SOPN: 56.0000 [IU] | PEN_INJECTOR | Freq: Every day | SUBCUTANEOUS | 3 refills | Status: AC

## 2020-11-16 MED ORDER — NOVOLOG FLEXPEN 100 UNIT/ML ~~LOC~~ SOPN: 18.0000 [IU] | PEN_INJECTOR | Freq: Three times a day (TID) | SUBCUTANEOUS | 3 refills | Status: AC

## 2020-11-16 NOTE — Patient Instructions (Addendum)
Please decrease: - Toujeo 56 units at bedtime  Continue - NovoLog 12-18 units before meals - Metformin 2000 mg with dinner  Try to add fish oil 1000 mg 2x a day.  Please return in 4-6 months with your sugar log.

## 2020-11-16 NOTE — Progress Notes (Signed)
Patient ID: [name], male   DOB: 1950-04-29, 70 y.o.   MRN: [id number]  This visit occurred during the SARS-CoV-2 public health emergency.  Safety protocols were in place, including screening questions prior to the visit, additional usage of staff PPE, and extensive cleaning of exam room while observing appropriate contact time as indicated for disinfecting solutions.   HPI: [name] is a 70 y.o.-year-old male, returning for f/u for DM2, dx 2008, insulin-dependent since dx, uncontrolled, with complications (peripheral neuropathy, CAD-status post CABG in 2008, PVD, Charcot foot, history of diabetic foot ulcer, AKA bilaterally). Last visit 6 mo ago. 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 Also Part D - [employer].   Interim hx: No increased urination, blurry vision, nausea, chest pain.  He had kidney stones + pyelonephritis 2 mo ago  >> he was on ABx.  He then had cystoscopy/stent placement. He got Covid19 after going to the hospital for prelithotripsy labs. He was on Paxlovid. He is still fatigued and cannot sleep well. He was in the emergency room with proctitis 11/02/2020.  Reviewed HbA1c: Lab Results  Component Value Date   HGBA1C 5.8 (A) 11/16/2020   HGBA1C 6.1 (H) 07/28/2020   HGBA1C 6.8 (H) 04/10/2020   HGBA1C 6.4 (A) 05/31/2019   HGBA1C 8.6 (H) 12/11/2018   HGBA1C 6.4 (A) 12/12/2017   HGBA1C 7.3 (A) 08/05/2017   HGBA1C 6.3 03/06/2017   HGBA1C 5.7 11/05/2016   HGBA1C 7.3 05/13/2016   HGBA1C 6.1 02/14/2016   HGBA1C 7.7 12/04/2015   HGBA1C 8.0 08/31/2015   HGBA1C 7.5 03/29/2015   HGBA1C 7.9 12/29/2014   HGBA1C 7.5 08/09/2014   HGBA1C 7.1 (H) 05/09/2014   HGBA1C 7.7 (H) 01/06/2014   HGBA1C 7.8 (H) 10/06/2013   HGBA1C 6.6 (H) 07/06/2013    Pt is on a regimen of: - Metformin 2000 mg with dinner - Toujeo 60 >> 66 >> 56 >> 66 units at bedtime - Humalog 25-30 >> Novolog  16-26 >> 25-30 >> 20-25  >> 12-18 units before meals  Pt checks his sugars 1-3  times a day - am: 110-140 > 132-190 >> 120-160 >> 117-130, 180 >> 85-90s - before lunch: 69, 104-154, 207 >> n/c >> 56 >> n/c - before dinner: 120-150 >> 94-120s >> 120-160, 190 >> 130-135 >> 110s - bedtime:  96-160 >> n/c >> 110-130 >> n/c >> 130-140 >> up to 128 Lowest: 56 x1 (lunchtime) ... 94 >> 120 >> 85. Hypogly awareness at 100.  Highest sugar: 290 >> 215 >> 180 >> 128.  Patient meals are: - Breakfast: bacon, eggs, tomato sandwich (sometimes no bread) >> coffee + fruit - Lunch: PB crackers >> baked fish or chicken + veggies - Dinner: meat + 2 veggies  >> same as lunch - Snacks: 1 a day - 2x a week, icecream  >> fruit Uses Splenda in tea and coffee.  -+ CKD, last BUN/creatinine:  Lab Results  Component Value Date   BUN 13 11/02/2020   CREATININE 0.86 11/02/2020  On losartan.  -+ HL; last set of lipids: Lab Results  Component Value Date   CHOL 88 04/10/2020   HDL 19.40 (L) 04/10/2020   LDLCALC 52 02/19/2017   LDLDIRECT 38.0 04/10/2020   TRIG (H) 04/10/2020    473.0 Triglyceride is over 400; calculations on Lipids are invalid.   CHOLHDL 5 04/10/2020  He is statin intolerant. He had joint pain from from statins.  - last eye exam was in 2020: No DR.  Dr. Claudean Kinds.  He had a detached retina in 03/2015 >> had Laser sx (his 3rd).  He had right cataract surgery 06/2015.  He had amputation of his big toe in 12/30/2012 2/2 infection >> osteomyelitis. He continued to have problems with ulcers on his left foot, in the setting of Charcot joint. He had a L BKA (02/23/2016) b/c he had a long h/o ulcer on his Charcot foot >> developed osteomyelitis.  Now prosthesis is off due to his ulcers.  He had L AKA 12/2017, R 5th ray amputation 12/2017.  Since last visit, he had right AKA 12/11/2018. Before this, he had infection and gangrene in his R leg.   He also has a history of prostate cancer , s/p 25 RxTx, then radioactive seeds.  He also has HTN, OSA, only.  ROS: + see HPI  I reviewed  pt's medications, allergies, PMH, social hx, family hx, and changes were documented in the history of present illness. Otherwise, unchanged from my initial visit note.   Past Medical History:  Diagnosis Date   Basal cell carcinoma 02/23/2019   nodular/infiltrative- right forearm-mohs   Cellulitis and abscess of left lower extremity 12/20/2017   Coronary atherosclerosis of unspecified type of vessel, native or graft    Diabetes mellitus without complication ([place])    Hemorrhage of rectum and anus    History of kidney stones    passed stones   Hypertension    Impotence of organic origin    Internal hemorrhoids without mention of complication    Malignant neoplasm of prostate ([place])    Mononeuritis of unspecified site    Osteoarthrosis, unspecified whether generalized or localized, unspecified site    Other and unspecified hyperlipidemia    Peripheral vascular disease ([place])    B BKA   Personal history of colonic polyps    Personal history of gallstones    Subacute osteomyelitis, right ankle and foot ([place])    Type II or unspecified type diabetes mellitus with neurological manifestations, not stated as uncontrolled(250.60)    Unspecified glaucoma(365.9)    Unspecified sleep apnea    cpcp   Past Surgical History:  Procedure Laterality Date   AMPUTATION Left 12/30/2012   Procedure: AMPUTATION RAY ;  Surgeon: Meredith Pel, MD;  Location: [facility];  Service: Orthopedics;  Laterality: Left;  LEFT GREAT TOE RAY AMPUTATION   AMPUTATION Left 02/23/2016   Procedure: Left Below Knee Amputation;  Surgeon: [name], MD;  Location: [place] [facility];  Service: Orthopedics;  Laterality: Left;   AMPUTATION Left 12/24/2017   Procedure: LEFT ABOVE KNEE AMPUTATION;  Surgeon: [name], MD;  Location: [place];  Service: Orthopedics;  Laterality: Left;   AMPUTATION Right 12/24/2017   Procedure: RIGHT 5TH RAY AMPUTATION;  Surgeon: [name], MD;  Location: [place];  Service: Orthopedics;  Laterality:  Right;   AMPUTATION Right 12/11/2018   Procedure: RIGHT ABOVE KNEE AMPUTATION;  Surgeon: [name], MD;  Location: [place];  Service: Orthopedics;  Laterality: Right;   APPENDECTOMY     BASAL CELL CARCINOMA EXCISION  2/16   left forearm   [place]   Negative   CATARACT EXTRACTION Right 2017   then lid surgery   COLONOSCOPY     COLONOSCOPY WITH PROPOFOL N/A 04/29/2019   Procedure: COLONOSCOPY WITH PROPOFOL;  Surgeon: Jackquline Denmark, MD;  Location: [facility];  Service: Endoscopy;  Laterality: N/A;   CORONARY ARTERY BYPASS GRAFT  09/2005  5 bypasses per patient, Post op AFIB   CYSTOSCOPY/URETEROSCOPY/HOLMIUM LASER/STENT PLACEMENT Left 08/03/2020   Procedure: CYSTOSCOPY/ RETROGRADE/STENT PLACEMENT;  Surgeon: Raynelle Bring, MD;  Location: [facility];  Service: Urology;  Laterality: Left;   CYSTOSCOPY/URETEROSCOPY/HOLMIUM LASER/STENT PLACEMENT Left 08/28/2020   Procedure: CYSTOSCOPY/RETROGRADE/URETEROSCOPY/HOLMIUM LASER/STENT PLACEMENT;  Surgeon: Raynelle Bring, MD;  Location: [facility];  Service: Urology;  Laterality: Left;   ESOPHAGOGASTRODUODENOSCOPY (EGD) WITH PROPOFOL N/A 04/29/2019   Procedure: ESOPHAGOGASTRODUODENOSCOPY (EGD) WITH PROPOFOL;  Surgeon: Jackquline Denmark, MD;  Location: [facility];  Service: Endoscopy;  Laterality: N/A;   EYE SURGERY     FOOT BONE EXCISION Left 11/03/2013   DR DUDA    I & D EXTREMITY Left 11/03/2013   Procedure: Left Foot Partial Bone Excision Cuboid and Medial Cuneiform, Wound Closures;  Surgeon: [name], MD;  Location: [place];  Service: Orthopedics;  Laterality: Left;   INSERTION PROSTATE RADIATION SEED  2009   RT and seeds for prostate cancer   KIDNEY STONE SURGERY  04/1993   RCA stents  04/2003   EF 55%   RETINAL DETACHMENT SURGERY  2002-2003   thrombosed vein  1993   Right leg   Social History   Socioeconomic History   Marital status: Widowed    Spouse name: Not on file   Number of children: 3    Years of education: Not on file   Highest education level: Not on file  Occupational History   Occupation: Heavy equipment mechanic--retired  Tobacco Use   Smoking status: Former    Packs/day: 2.00    Years: 35.00    Pack years: 70.00    Types: 21, Cigarettes    Quit date: 01/07/2001    Years since quitting: 19.8   Smokeless tobacco: Never  Vaping Use   Vaping Use: Never used  Substance and Sexual Activity   Alcohol use: Not Currently    Comment: rare   Drug use: No   Sexual activity: Yes    Partners: Female  Other Topics Concern   Not on file  Social History Narrative   Lives with girlfriend [name] since ~2005   Has living will   Daughter [name] is [place] health care POA    Would accept resuscitation attempts--but no prolonged artificial ventilation   No tube feeds if cognitively unaware   Social Determinants of Health   Financial Resource Strain: Not on file  Food Insecurity: Not on file  Transportation Needs: Not on file  Physical Activity: Not on file  Stress: Not on file  Social Connections: Not on file  Intimate Partner Violence: Not on file   Current Outpatient Medications on File Prior to Visit  Medication Sig Dispense Refill   ACCU-CHEK GUIDE test strip USE AS DIRECTED TO CHECK BLOOD SUGAR 3 TIMES DAILY 300 strip 2   aspirin EC 81 MG tablet Take 81 mg by mouth daily. Swallow whole.     carvedilol (COREG) 25 MG tablet TAKE 1 TABLET TWICE DAILY (Patient taking differently: Take 25 mg by mouth in the morning and at bedtime.) 180 tablet 3   cefdinir (OMNICEF) 300 MG capsule Take 1 capsule (300 mg total) by mouth 2 (two) times daily. 20 capsule 0   DROPLET PEN NEEDLES 31G X 5 MM MISC USE TO INJECT INSULIN FOUR TIMES DAILY AS INSTRUCTED (FOR DIABETES) 400 each 11   insulin glargine, 2 Unit Dial, (TOUJEO MAX SOLOSTAR) 300 UNIT/ML Solostar Pen Inject 66 Units into the skin at bedtime. 18 mL 1   losartan-hydrochlorothiazide (HYZAAR)  50-12.5 MG tablet TAKE 1 TABLET  EVERY DAY 90 tablet 3   metFORMIN (GLUCOPHAGE) 1000 MG tablet Take 2 tablets (2,000 mg total) by mouth daily with supper. 180 tablet 3   Multiple Vitamin (MULTIVITAMIN WITH MINERALS) TABS tablet Take 1 tablet by mouth daily. Centrum Silver for Men 50+     mupirocin ointment (BACTROBAN) 2 % APPLY TO THE AFFECTED AREA TOPICALLY DAILY (Patient taking differently: Apply 1 application topically daily as needed (irritation).) 22 g 1   NOVOLOG FLEXPEN 100 UNIT/ML FlexPen INJECT 25-30 UNITS INTO THE SKIN 3 (THREE) TIMES DAILY WITH MEALS. SLIDING SCALE INSULIN (Patient taking differently: Inject 18-25 Units into the skin 3 (three) times daily with meals. Sliding Scale Insulin) 75 mL 1   ondansetron (ZOFRAN) 4 MG tablet Take 4 mg by mouth every 6 (six) hours as needed for nausea/vomiting.     tamsulosin (FLOMAX) 0.4 MG CAPS capsule Take 0.4 mg by mouth at bedtime.     traMADol (ULTRAM) 50 MG tablet Take 1-2 tablets (50-100 mg total) by mouth every 6 (six) hours as needed (pain). 15 tablet 0   No current facility-administered medications on file prior to visit.   Allergies  Allergen Reactions   Atorvastatin Other (See Comments)    myalgias   Ezetimibe Other (See Comments)    Body ache   Simvastatin Other (See Comments)    Body ache   Family History  Problem Relation Age of Onset   Lung cancer Father    Multiple sclerosis Mother    Heart attack Other        paternal aunts and uncles   Peripheral vascular disease Maternal Grandfather        several amputations    PE: BP 128/68 (BP Location: Right Arm, Patient Position: Sitting, Cuff Size: Normal)   Pulse 77   Ht 6\' 2"  (1.88 m)   SpO2 97%   BMI 32.10 kg/m  Body mass index is 32.1 kg/m. Could not be weighed as he is in a wheelchair    Wt Readings from Last 3 Encounters:  07/28/20 250 lb (113.4 kg)  05/06/19 262 lb (118.8 kg)  04/29/19 262 lb 12.6 oz (119.2 kg)   Constitutional: overweight, in NAD, in wheelchair Eyes: PERRLA, EOMI, no  exophthalmos ENT: moist mucous membranes, no thyromegaly, no cervical lymphadenopathy Cardiovascular: RRR, No MRG Respiratory: CTA B Gastrointestinal: abdomen soft, NT, ND, BS+ Musculoskeletal: + B AKA, strength intact in all 4 Skin: moist, warm, no rashes Neurological: no tremor with outstretched hands, DTR normal in all 4  ASSESSMENT: 1. DM2, insulin-dependent, uncontrolled, with complications - peripheral neuropathy - CAD-status post stent in 2005, then 5v CABG in 2008 - PVD - history of diabetic foot ulcer - Charcot foot - L - h/o amputation of his big toe in 12/2012 2/2 osteomyelitis and L BKA 01/2016; L AKA 12/2017, R 5th ray amputation 12/2017 - history of retinal detachment in 2002-3  2. Obesity class 2 BMI Classification: < 18.5 underweight  18.5-24.9 normal weight  25.0-29.9 overweight  30.0-34.9 class I obesity  35.0-39.9 class II obesity  ? 40.0 class III obesity   3. HL  PLAN:  1. Patient with h/o uncontrolled type 2 diabetes, on basal/bolus insulin regimen and also metformin, with significantly low blood sugars in the last 4 years, and further improved from last visit.  At that time, sugars were still at or very close to goal and we did not change his regimen. HbA1c was slightly higher, but still at goal,  6.8%.  Since then, he had another HbA1c obtained for presurgical clearance and it was 6.1%. -At today's visit, sugars remain excellent, lower than 100 in the morning and still controlled during the day.  Therefore, for now, I advised him to reduce the dose of Toujeo by 10 units.  He already reduced the dose of NovoLog and will continue with the new doses as they appear to be adequate.  I did advise him that if he starts having low blood sugars in the 70s to let me know so we can reduce his NovoLog dose further. - I advised him to:  Patient Instructions  Please decrease: - Toujeo 56 units at bedtime  Continue - NovoLog 12-18 units before meals - Metformin 2000 mg  with dinner  Try to add fish oil 1000 mg 2x a day.  Please return in 4-6 months with your sugar log.   - we checked his HbA1c: 5.8% (lower, excellent) - advised to check sugars at different times of the day - 3-4x a day, rotating check times - advised for yearly eye exams >> he is not UTD - return to clinic in 4-6 months  2. Obesity class 2 -He initially lost 20 pounds on a more plant-based diet but gained some of this back -We cannot obtain a good weight in the clinic due to having bilateral amputations  3. HL -Reviewed latest lipid panel from 04/2020: HDL low, triglycerides high, LDL at goal Lab Results  Component Value Date   CHOL 88 04/10/2020   HDL 19.40 (L) 04/10/2020   LDLCALC 52 02/19/2017   LDLDIRECT 38.0 04/10/2020   TRIG (H) 04/10/2020    473.0 Triglyceride is over 400; calculations on Lipids are invalid.   CHOLHDL 5 04/10/2020  -He is intolerant to statins due to joint pains -At last visit I suggested fish oil 1000 mg twice a day but he forgot.  I again discussed with him about starting this.  Philemon Kingdom, MD PhD [facility] Endocrinology

## 2020-12-06 DIAGNOSIS — Z23 Encounter for immunization: Secondary | ICD-10-CM | POA: Diagnosis not present

## 2021-01-24 DIAGNOSIS — N3946 Mixed incontinence: Secondary | ICD-10-CM | POA: Diagnosis not present

## 2021-01-24 DIAGNOSIS — C61 Malignant neoplasm of prostate: Secondary | ICD-10-CM | POA: Diagnosis not present

## 2021-01-25 ENCOUNTER — Other Ambulatory Visit: Payer: Self-pay

## 2021-01-25 ENCOUNTER — Encounter (HOSPITAL_COMMUNITY): Payer: Self-pay

## 2021-01-25 ENCOUNTER — Inpatient Hospital Stay (HOSPITAL_COMMUNITY)
Admission: EM | Admit: 2021-01-25 | Discharge: 2021-02-07 | DRG: 377 | Disposition: E | Payer: Medicare Other | Attending: Pulmonary Disease | Admitting: Pulmonary Disease

## 2021-01-25 DIAGNOSIS — R6520 Severe sepsis without septic shock: Secondary | ICD-10-CM | POA: Diagnosis present

## 2021-01-25 DIAGNOSIS — E872 Acidosis, unspecified: Secondary | ICD-10-CM | POA: Diagnosis present

## 2021-01-25 DIAGNOSIS — G4733 Obstructive sleep apnea (adult) (pediatric): Secondary | ICD-10-CM | POA: Diagnosis not present

## 2021-01-25 DIAGNOSIS — E871 Hypo-osmolality and hyponatremia: Secondary | ICD-10-CM | POA: Diagnosis not present

## 2021-01-25 DIAGNOSIS — D62 Acute posthemorrhagic anemia: Secondary | ICD-10-CM | POA: Diagnosis not present

## 2021-01-25 DIAGNOSIS — K5731 Diverticulosis of large intestine without perforation or abscess with bleeding: Principal | ICD-10-CM | POA: Diagnosis present

## 2021-01-25 DIAGNOSIS — N400 Enlarged prostate without lower urinary tract symptoms: Secondary | ICD-10-CM | POA: Diagnosis present

## 2021-01-25 DIAGNOSIS — E875 Hyperkalemia: Secondary | ICD-10-CM | POA: Diagnosis present

## 2021-01-25 DIAGNOSIS — Z923 Personal history of irradiation: Secondary | ICD-10-CM

## 2021-01-25 DIAGNOSIS — R6510 Systemic inflammatory response syndrome (SIRS) of non-infectious origin without acute organ dysfunction: Secondary | ICD-10-CM | POA: Diagnosis present

## 2021-01-25 DIAGNOSIS — R531 Weakness: Secondary | ICD-10-CM | POA: Diagnosis not present

## 2021-01-25 DIAGNOSIS — E1165 Type 2 diabetes mellitus with hyperglycemia: Secondary | ICD-10-CM | POA: Diagnosis present

## 2021-01-25 DIAGNOSIS — Z888 Allergy status to other drugs, medicaments and biological substances status: Secondary | ICD-10-CM

## 2021-01-25 DIAGNOSIS — K627 Radiation proctitis: Secondary | ICD-10-CM | POA: Diagnosis present

## 2021-01-25 DIAGNOSIS — K5791 Diverticulosis of intestine, part unspecified, without perforation or abscess with bleeding: Secondary | ICD-10-CM | POA: Diagnosis not present

## 2021-01-25 DIAGNOSIS — Z86010 Personal history of colonic polyps: Secondary | ICD-10-CM

## 2021-01-25 DIAGNOSIS — D649 Anemia, unspecified: Secondary | ICD-10-CM | POA: Diagnosis not present

## 2021-01-25 DIAGNOSIS — Z7982 Long term (current) use of aspirin: Secondary | ICD-10-CM

## 2021-01-25 DIAGNOSIS — Z9049 Acquired absence of other specified parts of digestive tract: Secondary | ICD-10-CM

## 2021-01-25 DIAGNOSIS — Z8546 Personal history of malignant neoplasm of prostate: Secondary | ICD-10-CM

## 2021-01-25 DIAGNOSIS — Z89611 Acquired absence of right leg above knee: Secondary | ICD-10-CM

## 2021-01-25 DIAGNOSIS — N200 Calculus of kidney: Secondary | ICD-10-CM | POA: Diagnosis not present

## 2021-01-25 DIAGNOSIS — Z20822 Contact with and (suspected) exposure to covid-19: Secondary | ICD-10-CM | POA: Diagnosis present

## 2021-01-25 DIAGNOSIS — E1151 Type 2 diabetes mellitus with diabetic peripheral angiopathy without gangrene: Secondary | ICD-10-CM | POA: Diagnosis present

## 2021-01-25 DIAGNOSIS — I469 Cardiac arrest, cause unspecified: Secondary | ICD-10-CM | POA: Diagnosis not present

## 2021-01-25 DIAGNOSIS — N529 Male erectile dysfunction, unspecified: Secondary | ICD-10-CM | POA: Diagnosis present

## 2021-01-25 DIAGNOSIS — Z87891 Personal history of nicotine dependence: Secondary | ICD-10-CM

## 2021-01-25 DIAGNOSIS — N179 Acute kidney failure, unspecified: Secondary | ICD-10-CM | POA: Diagnosis present

## 2021-01-25 DIAGNOSIS — K8020 Calculus of gallbladder without cholecystitis without obstruction: Secondary | ICD-10-CM | POA: Diagnosis not present

## 2021-01-25 DIAGNOSIS — N390 Urinary tract infection, site not specified: Secondary | ICD-10-CM | POA: Diagnosis not present

## 2021-01-25 DIAGNOSIS — Z89612 Acquired absence of left leg above knee: Secondary | ICD-10-CM

## 2021-01-25 DIAGNOSIS — I10 Essential (primary) hypertension: Secondary | ICD-10-CM | POA: Diagnosis not present

## 2021-01-25 DIAGNOSIS — I959 Hypotension, unspecified: Secondary | ICD-10-CM | POA: Diagnosis not present

## 2021-01-25 DIAGNOSIS — I2510 Atherosclerotic heart disease of native coronary artery without angina pectoris: Secondary | ICD-10-CM | POA: Diagnosis not present

## 2021-01-25 DIAGNOSIS — A419 Sepsis, unspecified organism: Secondary | ICD-10-CM | POA: Diagnosis present

## 2021-01-25 DIAGNOSIS — Z85828 Personal history of other malignant neoplasm of skin: Secondary | ICD-10-CM | POA: Diagnosis not present

## 2021-01-25 DIAGNOSIS — Z951 Presence of aortocoronary bypass graft: Secondary | ICD-10-CM | POA: Diagnosis not present

## 2021-01-25 DIAGNOSIS — J9601 Acute respiratory failure with hypoxia: Secondary | ICD-10-CM | POA: Diagnosis present

## 2021-01-25 DIAGNOSIS — Z794 Long term (current) use of insulin: Secondary | ICD-10-CM

## 2021-01-25 DIAGNOSIS — K922 Gastrointestinal hemorrhage, unspecified: Secondary | ICD-10-CM

## 2021-01-25 DIAGNOSIS — E279 Disorder of adrenal gland, unspecified: Secondary | ICD-10-CM | POA: Diagnosis present

## 2021-01-25 DIAGNOSIS — R1031 Right lower quadrant pain: Secondary | ICD-10-CM | POA: Diagnosis present

## 2021-01-25 DIAGNOSIS — R161 Splenomegaly, not elsewhere classified: Secondary | ICD-10-CM | POA: Diagnosis not present

## 2021-01-25 DIAGNOSIS — Z7984 Long term (current) use of oral hypoglycemic drugs: Secondary | ICD-10-CM

## 2021-01-25 DIAGNOSIS — Z79899 Other long term (current) drug therapy: Secondary | ICD-10-CM

## 2021-01-25 DIAGNOSIS — M1990 Unspecified osteoarthritis, unspecified site: Secondary | ICD-10-CM | POA: Diagnosis present

## 2021-01-25 DIAGNOSIS — G9341 Metabolic encephalopathy: Secondary | ICD-10-CM | POA: Diagnosis present

## 2021-01-25 DIAGNOSIS — E785 Hyperlipidemia, unspecified: Secondary | ICD-10-CM | POA: Diagnosis present

## 2021-01-25 DIAGNOSIS — Z87442 Personal history of urinary calculi: Secondary | ICD-10-CM

## 2021-01-25 DIAGNOSIS — R000 Tachycardia, unspecified: Secondary | ICD-10-CM | POA: Diagnosis not present

## 2021-01-25 DIAGNOSIS — E1141 Type 2 diabetes mellitus with diabetic mononeuropathy: Secondary | ICD-10-CM | POA: Diagnosis present

## 2021-01-25 DIAGNOSIS — N3289 Other specified disorders of bladder: Secondary | ICD-10-CM | POA: Diagnosis not present

## 2021-01-25 DIAGNOSIS — Y842 Radiological procedure and radiotherapy as the cause of abnormal reaction of the patient, or of later complication, without mention of misadventure at the time of the procedure: Secondary | ICD-10-CM | POA: Diagnosis present

## 2021-01-25 DIAGNOSIS — N281 Cyst of kidney, acquired: Secondary | ICD-10-CM | POA: Diagnosis not present

## 2021-01-25 DIAGNOSIS — K625 Hemorrhage of anus and rectum: Secondary | ICD-10-CM | POA: Diagnosis not present

## 2021-01-25 DIAGNOSIS — I739 Peripheral vascular disease, unspecified: Secondary | ICD-10-CM | POA: Diagnosis not present

## 2021-01-25 DIAGNOSIS — R0602 Shortness of breath: Secondary | ICD-10-CM

## 2021-01-25 DIAGNOSIS — E1142 Type 2 diabetes mellitus with diabetic polyneuropathy: Secondary | ICD-10-CM | POA: Diagnosis present

## 2021-01-25 DIAGNOSIS — Z8249 Family history of ischemic heart disease and other diseases of the circulatory system: Secondary | ICD-10-CM

## 2021-01-25 DIAGNOSIS — Z4682 Encounter for fitting and adjustment of non-vascular catheter: Secondary | ICD-10-CM | POA: Diagnosis not present

## 2021-01-25 LAB — COMPREHENSIVE METABOLIC PANEL
ALT: 15 U/L (ref 0–44)
AST: 26 U/L (ref 15–41)
Albumin: 2.7 g/dL — ABNORMAL LOW (ref 3.5–5.0)
Alkaline Phosphatase: 48 U/L (ref 38–126)
Anion gap: 10 (ref 5–15)
BUN: 64 mg/dL — ABNORMAL HIGH (ref 8–23)
CO2: 22 mmol/L (ref 22–32)
Calcium: 8.5 mg/dL — ABNORMAL LOW (ref 8.9–10.3)
Chloride: 99 mmol/L (ref 98–111)
Creatinine, Ser: 1.24 mg/dL (ref 0.61–1.24)
GFR, Estimated: >60 mL/min (ref >60)
Glucose, Bld: 297 mg/dL — ABNORMAL HIGH (ref 70–99)
Potassium: 5.9 mmol/L — ABNORMAL HIGH (ref 3.5–5.1)
Sodium: 131 mmol/L — ABNORMAL LOW (ref 135–145)
Total Bilirubin: 1.4 mg/dL — ABNORMAL HIGH (ref 0.3–1.2)
Total Protein: 5.2 g/dL — ABNORMAL LOW (ref 6.5–8.1)

## 2021-01-25 LAB — CBC WITH DIFFERENTIAL/PLATELET
Abs Immature Granulocytes: 0.3 10*3/uL — ABNORMAL HIGH (ref 0.00–0.07)
Basophils Absolute: 0.2 10*3/uL — ABNORMAL HIGH (ref 0.0–0.1)
Basophils Relative: 2 %
Eosinophils Absolute: 0 10*3/uL (ref 0.0–0.5)
Eosinophils Relative: 0 %
HCT: 30 % — ABNORMAL LOW (ref 39.0–52.0)
Hemoglobin: 9.8 g/dL — ABNORMAL LOW (ref 13.0–17.0)
Lymphocytes Relative: 9 %
Lymphs Abs: 0.7 10*3/uL (ref 0.7–4.0)
MCH: 25.9 pg — ABNORMAL LOW (ref 26.0–34.0)
MCHC: 32.7 g/dL (ref 30.0–36.0)
MCV: 79.4 fL — ABNORMAL LOW (ref 80.0–100.0)
Metamyelocytes Relative: 2 %
Monocytes Absolute: 0.4 10*3/uL (ref 0.1–1.0)
Monocytes Relative: 5 %
Myelocytes: 2 %
Neutro Abs: 6.1 10*3/uL (ref 1.7–7.7)
Neutrophils Relative %: 80 %
Platelets: 223 10*3/uL (ref 150–400)
RBC: 3.78 MIL/uL — ABNORMAL LOW (ref 4.22–5.81)
RDW: 18.5 % — ABNORMAL HIGH (ref 11.5–15.5)
WBC: 7.6 10*3/uL (ref 4.0–10.5)
nRBC: 2.1 % — ABNORMAL HIGH (ref 0.0–0.2)
nRBC: 3 /100 WBC — ABNORMAL HIGH

## 2021-01-25 LAB — LIPASE, BLOOD: Lipase: 50 U/L (ref 11–51)

## 2021-01-25 NOTE — ED Triage Notes (Addendum)
Pt presents to the ED from home with complaints of abd pain and rectal bleeding onset today. Pt states has hx of diverticulitis. 3 episodes of bright red stool at home. Takes 1 Baby ASA daily. Had to have blood transfusion the last time this happened.

## 2021-01-25 NOTE — ED Provider Triage Note (Signed)
**Note Martin-Identified via Obfuscation** Emergency Medicine Provider Triage Evaluation Note  [name] , a 71 y.o. male  was evaluated in triage.  Pt complains of abdominal pain.  Started 3 days ago, today he noticed dark tarry stools.  He had 3 episodes of dark tarry stools today, no iron or Pepto use.  Right lower quadrant pain, status post appendectomy.  Last colonoscopy 1 year ago and normal per patient..  Not on blood thinners  Review of Systems  Positive: Nausea, abdominal pain Negative: V  Physical Exam  BP (!) 139/57 (BP Location: Right Arm)    Pulse (!) 117    Temp 98.9 F (37.2 C) (Oral)    Resp (!) 28    Ht 6\' 2"  (1.88 m)    Wt 90.7 kg    SpO2 98%    BMI 25.68 kg/m  Gen:   Awake, no distress   Resp:  Normal effort  MSK:   Moves extremities without difficulty  Other:  Right lower quadrant tenderness, patient is tachycardic.  Medical Decision Making  Medically screening exam initiated at 9:13 PM.  Appropriate orders placed.  [name] was informed that the remainder of the evaluation will be completed by another provider, this initial triage assessment does not replace that evaluation, and the importance of remaining in the ED until their evaluation is complete.  Abdominal work-up.   Sherrill Raring, PA-C 01/14/2021 [id number]

## 2021-01-26 ENCOUNTER — Emergency Department (HOSPITAL_COMMUNITY): Payer: Medicare Other

## 2021-01-26 ENCOUNTER — Observation Stay (HOSPITAL_COMMUNITY): Payer: Medicare Other

## 2021-01-26 ENCOUNTER — Telehealth: Payer: Self-pay

## 2021-01-26 ENCOUNTER — Inpatient Hospital Stay (HOSPITAL_COMMUNITY): Payer: Medicare Other

## 2021-01-26 DIAGNOSIS — E875 Hyperkalemia: Secondary | ICD-10-CM

## 2021-01-26 DIAGNOSIS — K922 Gastrointestinal hemorrhage, unspecified: Secondary | ICD-10-CM | POA: Diagnosis not present

## 2021-01-26 DIAGNOSIS — G9341 Metabolic encephalopathy: Secondary | ICD-10-CM | POA: Diagnosis present

## 2021-01-26 DIAGNOSIS — R6510 Systemic inflammatory response syndrome (SIRS) of non-infectious origin without acute organ dysfunction: Secondary | ICD-10-CM | POA: Diagnosis not present

## 2021-01-26 DIAGNOSIS — K627 Radiation proctitis: Secondary | ICD-10-CM | POA: Diagnosis present

## 2021-01-26 DIAGNOSIS — R6520 Severe sepsis without septic shock: Secondary | ICD-10-CM | POA: Diagnosis present

## 2021-01-26 DIAGNOSIS — J9601 Acute respiratory failure with hypoxia: Secondary | ICD-10-CM | POA: Diagnosis present

## 2021-01-26 DIAGNOSIS — Z20822 Contact with and (suspected) exposure to covid-19: Secondary | ICD-10-CM | POA: Diagnosis present

## 2021-01-26 DIAGNOSIS — E871 Hypo-osmolality and hyponatremia: Secondary | ICD-10-CM | POA: Diagnosis present

## 2021-01-26 DIAGNOSIS — Z85828 Personal history of other malignant neoplasm of skin: Secondary | ICD-10-CM | POA: Diagnosis not present

## 2021-01-26 DIAGNOSIS — E1141 Type 2 diabetes mellitus with diabetic mononeuropathy: Secondary | ICD-10-CM | POA: Diagnosis present

## 2021-01-26 DIAGNOSIS — R531 Weakness: Secondary | ICD-10-CM | POA: Diagnosis not present

## 2021-01-26 DIAGNOSIS — Z86010 Personal history of colonic polyps: Secondary | ICD-10-CM | POA: Diagnosis not present

## 2021-01-26 DIAGNOSIS — N179 Acute kidney failure, unspecified: Secondary | ICD-10-CM | POA: Diagnosis present

## 2021-01-26 DIAGNOSIS — D62 Acute posthemorrhagic anemia: Secondary | ICD-10-CM | POA: Diagnosis not present

## 2021-01-26 DIAGNOSIS — N3289 Other specified disorders of bladder: Secondary | ICD-10-CM | POA: Diagnosis not present

## 2021-01-26 DIAGNOSIS — K8020 Calculus of gallbladder without cholecystitis without obstruction: Secondary | ICD-10-CM | POA: Diagnosis not present

## 2021-01-26 DIAGNOSIS — E1151 Type 2 diabetes mellitus with diabetic peripheral angiopathy without gangrene: Secondary | ICD-10-CM | POA: Diagnosis present

## 2021-01-26 DIAGNOSIS — G4733 Obstructive sleep apnea (adult) (pediatric): Secondary | ICD-10-CM | POA: Diagnosis not present

## 2021-01-26 DIAGNOSIS — R161 Splenomegaly, not elsewhere classified: Secondary | ICD-10-CM | POA: Diagnosis not present

## 2021-01-26 DIAGNOSIS — K5791 Diverticulosis of intestine, part unspecified, without perforation or abscess with bleeding: Secondary | ICD-10-CM

## 2021-01-26 DIAGNOSIS — Z8546 Personal history of malignant neoplasm of prostate: Secondary | ICD-10-CM

## 2021-01-26 DIAGNOSIS — A419 Sepsis, unspecified organism: Secondary | ICD-10-CM | POA: Diagnosis present

## 2021-01-26 DIAGNOSIS — I739 Peripheral vascular disease, unspecified: Secondary | ICD-10-CM

## 2021-01-26 DIAGNOSIS — E1142 Type 2 diabetes mellitus with diabetic polyneuropathy: Secondary | ICD-10-CM

## 2021-01-26 DIAGNOSIS — I10 Essential (primary) hypertension: Secondary | ICD-10-CM

## 2021-01-26 DIAGNOSIS — E1165 Type 2 diabetes mellitus with hyperglycemia: Secondary | ICD-10-CM | POA: Diagnosis present

## 2021-01-26 DIAGNOSIS — N200 Calculus of kidney: Secondary | ICD-10-CM | POA: Diagnosis not present

## 2021-01-26 DIAGNOSIS — Z4682 Encounter for fitting and adjustment of non-vascular catheter: Secondary | ICD-10-CM | POA: Diagnosis not present

## 2021-01-26 DIAGNOSIS — Y842 Radiological procedure and radiotherapy as the cause of abnormal reaction of the patient, or of later complication, without mention of misadventure at the time of the procedure: Secondary | ICD-10-CM | POA: Diagnosis present

## 2021-01-26 DIAGNOSIS — N390 Urinary tract infection, site not specified: Secondary | ICD-10-CM | POA: Diagnosis not present

## 2021-01-26 DIAGNOSIS — I2510 Atherosclerotic heart disease of native coronary artery without angina pectoris: Secondary | ICD-10-CM | POA: Diagnosis not present

## 2021-01-26 DIAGNOSIS — R1031 Right lower quadrant pain: Secondary | ICD-10-CM | POA: Diagnosis present

## 2021-01-26 DIAGNOSIS — K5731 Diverticulosis of large intestine without perforation or abscess with bleeding: Secondary | ICD-10-CM | POA: Diagnosis present

## 2021-01-26 DIAGNOSIS — Z951 Presence of aortocoronary bypass graft: Secondary | ICD-10-CM | POA: Diagnosis not present

## 2021-01-26 DIAGNOSIS — R0602 Shortness of breath: Secondary | ICD-10-CM | POA: Diagnosis not present

## 2021-01-26 DIAGNOSIS — I959 Hypotension, unspecified: Secondary | ICD-10-CM | POA: Diagnosis not present

## 2021-01-26 DIAGNOSIS — E872 Acidosis, unspecified: Secondary | ICD-10-CM | POA: Diagnosis present

## 2021-01-26 DIAGNOSIS — E785 Hyperlipidemia, unspecified: Secondary | ICD-10-CM | POA: Diagnosis present

## 2021-01-26 DIAGNOSIS — Z794 Long term (current) use of insulin: Secondary | ICD-10-CM

## 2021-01-26 DIAGNOSIS — N281 Cyst of kidney, acquired: Secondary | ICD-10-CM | POA: Diagnosis not present

## 2021-01-26 DIAGNOSIS — E279 Disorder of adrenal gland, unspecified: Secondary | ICD-10-CM | POA: Diagnosis present

## 2021-01-26 DIAGNOSIS — K625 Hemorrhage of anus and rectum: Secondary | ICD-10-CM | POA: Diagnosis not present

## 2021-01-26 DIAGNOSIS — N400 Enlarged prostate without lower urinary tract symptoms: Secondary | ICD-10-CM | POA: Diagnosis present

## 2021-01-26 LAB — I-STAT ARTERIAL BLOOD GAS, ED
Acid-base deficit: 7 mmol/L — ABNORMAL HIGH (ref 0.0–2.0)
Bicarbonate: 16.5 mmol/L — ABNORMAL LOW (ref 20.0–28.0)
Calcium, Ion: 1.18 mmol/L (ref 1.15–1.40)
HCT: 27 % — ABNORMAL LOW (ref 39.0–52.0)
Hemoglobin: 9.2 g/dL — ABNORMAL LOW (ref 13.0–17.0)
O2 Saturation: 100 %
Patient temperature: 98.4
Potassium: 7 mmol/L (ref 3.5–5.1)
Sodium: 126 mmol/L — ABNORMAL LOW (ref 135–145)
TCO2: 17 mmol/L — ABNORMAL LOW (ref 22–32)
pCO2 arterial: 26.5 mmHg — ABNORMAL LOW (ref 32.0–48.0)
pH, Arterial: 7.403 (ref 7.350–7.450)
pO2, Arterial: 438 mmHg — ABNORMAL HIGH (ref 83.0–108.0)

## 2021-01-26 LAB — CBG MONITORING, ED
Glucose-Capillary: 425 mg/dL — ABNORMAL HIGH (ref 70–99)
Glucose-Capillary: 430 mg/dL — ABNORMAL HIGH (ref 70–99)
Glucose-Capillary: 372 mg/dL — ABNORMAL HIGH (ref 70–99)
Glucose-Capillary: 490 mg/dL — ABNORMAL HIGH (ref 70–99)
Glucose-Capillary: 522 mg/dL (ref 70–99)

## 2021-01-26 LAB — BASIC METABOLIC PANEL
Anion gap: 13 (ref 5–15)
Anion gap: 9 (ref 5–15)
BUN: 88 mg/dL — ABNORMAL HIGH (ref 8–23)
BUN: 98 mg/dL — ABNORMAL HIGH (ref 8–23)
CO2: 15 mmol/L — ABNORMAL LOW (ref 22–32)
CO2: 18 mmol/L — ABNORMAL LOW (ref 22–32)
Calcium: 8.1 mg/dL — ABNORMAL LOW (ref 8.9–10.3)
Calcium: 8 mg/dL — ABNORMAL LOW (ref 8.9–10.3)
Chloride: 100 mmol/L (ref 98–111)
Chloride: 99 mmol/L (ref 98–111)
Creatinine, Ser: 1.3 mg/dL — ABNORMAL HIGH (ref 0.61–1.24)
Creatinine, Ser: 1.51 mg/dL — ABNORMAL HIGH (ref 0.61–1.24)
GFR, Estimated: 58 mL/min — ABNORMAL LOW (ref >60)
GFR, Estimated: 49 mL/min — ABNORMAL LOW (ref >60)
Glucose, Bld: 411 mg/dL — ABNORMAL HIGH (ref 70–99)
Glucose, Bld: 477 mg/dL — ABNORMAL HIGH (ref 70–99)
Potassium: 6.2 mmol/L — ABNORMAL HIGH (ref 3.5–5.1)
Potassium: 6.7 mmol/L (ref 3.5–5.1)
Sodium: 128 mmol/L — ABNORMAL LOW (ref 135–145)
Sodium: 126 mmol/L — ABNORMAL LOW (ref 135–145)

## 2021-01-26 LAB — LACTIC ACID, PLASMA
Lactic Acid, Venous: 2.8 mmol/L (ref 0.5–1.9)
Lactic Acid, Venous: 3 mmol/L (ref 0.5–1.9)
Lactic Acid, Venous: 6.1 mmol/L (ref 0.5–1.9)

## 2021-01-26 LAB — URINALYSIS, ROUTINE W REFLEX MICROSCOPIC
Bilirubin Urine: NEGATIVE
Glucose, UA: 50 mg/dL — AB
Hgb urine dipstick: NEGATIVE
Ketones, ur: 5 mg/dL — AB
Nitrite: NEGATIVE
Protein, ur: NEGATIVE mg/dL
Specific Gravity, Urine: 1.029 (ref 1.005–1.030)
pH: 5 (ref 5.0–8.0)

## 2021-01-26 LAB — BETA-HYDROXYBUTYRIC ACID: Beta-Hydroxybutyric Acid: 0.61 mmol/L — ABNORMAL HIGH (ref 0.05–0.27)

## 2021-01-26 LAB — RESP PANEL BY RT-PCR (FLU A&B, COVID) ARPGX2
Influenza A by PCR: NEGATIVE
Influenza B by PCR: NEGATIVE
SARS Coronavirus 2 by RT PCR: NEGATIVE

## 2021-01-26 LAB — HEMOGLOBIN AND HEMATOCRIT, BLOOD
HCT: 27.6 % — ABNORMAL LOW (ref 39.0–52.0)
HCT: 28.9 % — ABNORMAL LOW (ref 39.0–52.0)
Hemoglobin: 9.5 g/dL — ABNORMAL LOW (ref 13.0–17.0)
Hemoglobin: 9.5 g/dL — ABNORMAL LOW (ref 13.0–17.0)

## 2021-01-26 LAB — BRAIN NATRIURETIC PEPTIDE: B Natriuretic Peptide: 92.3 pg/mL (ref 0.0–100.0)

## 2021-01-26 LAB — SODIUM, URINE, RANDOM: Sodium, Ur: 30 mmol/L

## 2021-01-26 LAB — PREPARE RBC (CROSSMATCH)

## 2021-01-26 LAB — POC OCCULT BLOOD, ED: Fecal Occult Bld: POSITIVE — AB

## 2021-01-26 LAB — CREATININE, URINE, RANDOM: Creatinine, Urine: 36.12 mg/dL

## 2021-01-26 LAB — OSMOLALITY, URINE: Osmolality, Ur: 452 mOsm/kg (ref 300–900)

## 2021-01-26 MED ORDER — FENTANYL 2500MCG IN NS 250ML (10MCG/ML) PREMIX INFUSION
25.0000 ug/h | INTRAVENOUS | Status: DC
  Filled 2021-01-26: qty 250

## 2021-01-26 MED ORDER — CALCIUM GLUCONATE 10 % IV SOLN: 1.0000 g | Freq: Once | INTRAVENOUS | Status: DC

## 2021-01-26 MED ORDER — SODIUM CHLORIDE 0.9% FLUSH: 3.0000 mL | Freq: Two times a day (BID) | INTRAVENOUS | Status: DC

## 2021-01-26 MED ORDER — SODIUM CHLORIDE 0.9 % IV SOLN
INTRAVENOUS | Status: AC | PRN
  Administered 2021-01-26: 1000 mL via INTRAVENOUS

## 2021-01-26 MED ORDER — ETOMIDATE 2 MG/ML IV SOLN
INTRAVENOUS | Status: AC
  Administered 2021-01-26: 20 mg via INTRAVENOUS
  Filled 2021-01-26: qty 10

## 2021-01-26 MED ORDER — ACETAMINOPHEN 650 MG RE SUPP: 650.0000 mg | Freq: Four times a day (QID) | RECTAL | Status: DC | PRN

## 2021-01-26 MED ORDER — INSULIN ASPART 100 UNIT/ML IV SOLN: 5.0000 [IU] | Freq: Once | INTRAVENOUS | Status: DC

## 2021-01-26 MED ORDER — SODIUM CHLORIDE 0.9 % IV SOLN
2.0000 g | INTRAVENOUS | Status: DC
  Administered 2021-01-26: 2 g via INTRAVENOUS
  Filled 2021-01-26: qty 20

## 2021-01-26 MED ORDER — INSULIN ASPART 100 UNIT/ML IJ SOLN
0.0000 [IU] | Freq: Four times a day (QID) | INTRAMUSCULAR | Status: DC
  Administered 2021-01-26 (×2): 20 [IU] via SUBCUTANEOUS

## 2021-01-26 MED ORDER — DEXTROSE 5 % IV SOLN
INTRAVENOUS | Status: AC | PRN
  Administered 2021-01-26: 300 mg via INTRAVENOUS

## 2021-01-26 MED ORDER — PANTOPRAZOLE INFUSION (NEW) - SIMPLE MED
8.0000 mg/h | INTRAVENOUS | Status: DC
  Administered 2021-01-26: 8 mg/h via INTRAVENOUS
  Filled 2021-01-26: qty 100
  Filled 2021-01-26: qty 80

## 2021-01-26 MED ORDER — ETOMIDATE 2 MG/ML IV SOLN: 20.0000 mg | Freq: Once | INTRAVENOUS | Status: AC

## 2021-01-26 MED ORDER — LACTATED RINGERS IV BOLUS
1000.0000 mL | Freq: Once | INTRAVENOUS | Status: AC
  Administered 2021-01-26: 1000 mL via INTRAVENOUS

## 2021-01-26 MED ORDER — CALCIUM CHLORIDE 10 % IV SOLN
INTRAVENOUS | Status: AC
  Filled 2021-01-26: qty 20

## 2021-01-26 MED ORDER — FENTANYL CITRATE PF 50 MCG/ML IJ SOSY
50.0000 ug | PREFILLED_SYRINGE | Freq: Once | INTRAMUSCULAR | Status: AC
  Administered 2021-01-26: 50 ug via INTRAVENOUS
  Filled 2021-01-26: qty 1

## 2021-01-26 MED ORDER — PANTOPRAZOLE 80MG IVPB - SIMPLE MED
80.0000 mg | Freq: Once | INTRAVENOUS | Status: AC
  Administered 2021-01-26: 80 mg via INTRAVENOUS
  Filled 2021-01-26: qty 80

## 2021-01-26 MED ORDER — SODIUM ZIRCONIUM CYCLOSILICATE 10 G PO PACK
10.0000 g | PACK | Freq: Once | ORAL | Status: AC
  Administered 2021-01-26: 10 g via ORAL
  Filled 2021-01-26: qty 1

## 2021-01-26 MED ORDER — ALBUTEROL SULFATE (2.5 MG/3ML) 0.083% IN NEBU: 2.5000 mg | INHALATION_SOLUTION | Freq: Four times a day (QID) | RESPIRATORY_TRACT | Status: DC | PRN

## 2021-01-26 MED ORDER — CALCIUM GLUCONATE-NACL 2-0.675 GM/100ML-% IV SOLN
2.0000 g | Freq: Once | INTRAVENOUS | Status: AC
  Administered 2021-01-26: 2000 mg via INTRAVENOUS
  Filled 2021-01-26: qty 100

## 2021-01-26 MED ORDER — CALCIUM CHLORIDE 10 % IV SOLN
INTRAVENOUS | Status: AC | PRN
  Administered 2021-01-26: 1 g via INTRAVENOUS

## 2021-01-26 MED ORDER — PANTOPRAZOLE SODIUM 40 MG IV SOLR: 40.0000 mg | Freq: Two times a day (BID) | INTRAVENOUS | Status: DC

## 2021-01-26 MED ORDER — DEXTROSE 50 % IV SOLN: 0.0000 mL | INTRAVENOUS | Status: DC | PRN

## 2021-01-26 MED ORDER — DEXMEDETOMIDINE HCL IN NACL 400 MCG/100ML IV SOLN
0.0000 ug/kg/h | INTRAVENOUS | Status: DC
  Filled 2021-01-26: qty 100

## 2021-01-26 MED ORDER — SODIUM CHLORIDE 0.9 % IV SOLN: 10.0000 mL/h | Freq: Once | INTRAVENOUS | Status: DC

## 2021-01-26 MED ORDER — CARVEDILOL 12.5 MG PO TABS
25.0000 mg | ORAL_TABLET | Freq: Two times a day (BID) | ORAL | Status: DC
  Administered 2021-01-26: 25 mg via ORAL
  Filled 2021-01-26 (×2): qty 2

## 2021-01-26 MED ORDER — CALCIUM GLUCONATE-NACL 1-0.675 GM/50ML-% IV SOLN
1.0000 g | Freq: Once | INTRAVENOUS | Status: DC
Start: 2021-01-26 — End: 2021-01-27

## 2021-01-26 MED ORDER — SODIUM ZIRCONIUM CYCLOSILICATE 10 G PO PACK
10.0000 g | PACK | Freq: Once | ORAL | Status: DC
Start: 2021-01-26 — End: 2021-01-27

## 2021-01-26 MED ORDER — SODIUM CHLORIDE 0.9 % IV SOLN: INTRAVENOUS | Status: DC

## 2021-01-26 MED ORDER — INSULIN GLARGINE-YFGN 100 UNIT/ML ~~LOC~~ SOLN
30.0000 [IU] | Freq: Every day | SUBCUTANEOUS | Status: DC
  Administered 2021-01-26: 30 [IU] via SUBCUTANEOUS
  Filled 2021-01-26: qty 0.3

## 2021-01-26 MED ORDER — ROCURONIUM BROMIDE 50 MG/5ML IV SOLN
100.0000 mg | Freq: Once | INTRAVENOUS | Status: AC
  Filled 2021-01-26: qty 10

## 2021-01-26 MED ORDER — FENTANYL CITRATE PF 50 MCG/ML IJ SOSY: 100.0000 ug | PREFILLED_SYRINGE | Freq: Once | INTRAMUSCULAR | Status: AC

## 2021-01-26 MED ORDER — FUROSEMIDE 10 MG/ML IJ SOLN
20.0000 mg | Freq: Once | INTRAMUSCULAR | Status: AC
  Administered 2021-01-26: 20 mg via INTRAVENOUS
  Filled 2021-01-26: qty 2

## 2021-01-26 MED ORDER — DEXTROSE IN LACTATED RINGERS 5 % IV SOLN: INTRAVENOUS | Status: DC

## 2021-01-26 MED ORDER — LACTATED RINGERS IV SOLN: INTRAVENOUS | Status: DC

## 2021-01-26 MED ORDER — PROPOFOL 1000 MG/100ML IV EMUL
5.0000 ug/kg/min | INTRAVENOUS | Status: DC
  Administered 2021-01-26: 10 ug/kg/min via INTRAVENOUS

## 2021-01-26 MED ORDER — POLYETHYLENE GLYCOL 3350 17 G PO PACK: 17.0000 g | PACK | Freq: Every day | ORAL | Status: DC | PRN

## 2021-01-26 MED ORDER — CARVEDILOL 12.5 MG PO TABS: 25.0000 mg | ORAL_TABLET | Freq: Two times a day (BID) | ORAL | Status: DC

## 2021-01-26 MED ORDER — FENTANYL BOLUS VIA INFUSION
25.0000 ug | INTRAVENOUS | Status: DC | PRN
  Filled 2021-01-26: qty 100

## 2021-01-26 MED ORDER — ONDANSETRON HCL 4 MG/2ML IJ SOLN: 4.0000 mg | Freq: Four times a day (QID) | INTRAMUSCULAR | Status: DC | PRN

## 2021-01-26 MED ORDER — DOCUSATE SODIUM 100 MG PO CAPS: 100.0000 mg | ORAL_CAPSULE | Freq: Two times a day (BID) | ORAL | Status: DC | PRN

## 2021-01-26 MED ORDER — ACETAMINOPHEN 325 MG PO TABS: 650.0000 mg | ORAL_TABLET | Freq: Four times a day (QID) | ORAL | Status: DC | PRN

## 2021-01-26 MED ORDER — FENTANYL CITRATE PF 50 MCG/ML IJ SOSY: 25.0000 ug | PREFILLED_SYRINGE | Freq: Once | INTRAMUSCULAR | Status: DC

## 2021-01-26 MED ORDER — ALBUTEROL SULFATE (2.5 MG/3ML) 0.083% IN NEBU: 10.0000 mg | INHALATION_SOLUTION | Freq: Once | RESPIRATORY_TRACT | Status: DC

## 2021-01-26 MED ORDER — ROCURONIUM BROMIDE 10 MG/ML (PF) SYRINGE
PREFILLED_SYRINGE | INTRAVENOUS | Status: AC
  Administered 2021-01-26: 100 mg via INTRAVENOUS
  Filled 2021-01-26: qty 10

## 2021-01-26 MED ORDER — SODIUM BICARBONATE 8.4 % IV SOLN
INTRAVENOUS | Status: AC | PRN
  Administered 2021-01-26 (×2): 50 meq via INTRAVENOUS

## 2021-01-26 MED ORDER — MIDAZOLAM HCL 2 MG/2ML IJ SOLN: 4.0000 mg | Freq: Once | INTRAMUSCULAR | Status: AC

## 2021-01-26 MED ORDER — INSULIN GLARGINE-YFGN 100 UNIT/ML ~~LOC~~ SOLN
30.0000 [IU] | Freq: Every day | SUBCUTANEOUS | Status: DC
  Filled 2021-01-26: qty 0.3

## 2021-01-26 MED ORDER — INSULIN REGULAR(HUMAN) IN NACL 100-0.9 UT/100ML-% IV SOLN
INTRAVENOUS | Status: DC
  Filled 2021-01-26: qty 100

## 2021-01-26 MED ORDER — FENTANYL CITRATE PF 50 MCG/ML IJ SOSY
PREFILLED_SYRINGE | INTRAMUSCULAR | Status: AC
  Administered 2021-01-26: 50 ug via INTRAVENOUS
  Filled 2021-01-26: qty 2

## 2021-01-26 MED ORDER — ONDANSETRON HCL 4 MG PO TABS: 4.0000 mg | ORAL_TABLET | Freq: Four times a day (QID) | ORAL | Status: DC | PRN

## 2021-01-26 MED ORDER — TAMSULOSIN HCL 0.4 MG PO CAPS: 0.8000 mg | ORAL_CAPSULE | Freq: Every day | ORAL | Status: DC

## 2021-01-26 MED ORDER — MIDAZOLAM HCL 2 MG/2ML IJ SOLN
INTRAMUSCULAR | Status: AC
  Administered 2021-01-26: 2 mg via INTRAVENOUS
  Filled 2021-01-26: qty 4

## 2021-01-26 MED ORDER — METOPROLOL TARTRATE 5 MG/5ML IV SOLN
5.0000 mg | Freq: Four times a day (QID) | INTRAVENOUS | Status: DC | PRN
  Filled 2021-01-26: qty 5

## 2021-01-26 MED ORDER — EPINEPHRINE 1 MG/10ML IJ SOSY
PREFILLED_SYRINGE | INTRAMUSCULAR | Status: AC | PRN
  Administered 2021-01-26 (×5): 1 mg via INTRAVENOUS

## 2021-01-26 MED ORDER — IOHEXOL 300 MG/ML  SOLN
100.0000 mL | Freq: Once | INTRAMUSCULAR | Status: AC | PRN
  Administered 2021-01-26: 100 mL via INTRAVENOUS

## 2021-01-26 NOTE — Procedures (Signed)
Intubation Procedure Note  [name]  [id number]  03-17-1950  Date:01/22/2021  Time:9:04 PM   Provider Performing:Hamlet Lasecki B Roxana Lai    Procedure: Intubation (31500)  Indication(s) Respiratory Failure  Consent Risks of the procedure as well as the alternatives and risks of each were explained to the patient and/or caregiver.  Consent for the procedure was obtained and is signed in the bedside chart   Anesthesia Etomidate, Versed, Fentanyl, and Rocuronium   Time Out Verified patient identification, verified procedure, site/side was marked, verified correct patient position, special equipment/implants available, medications/allergies/relevant history reviewed, required imaging and test results available.   Sterile Technique Usual hand hygeine, masks, and gloves were used   Procedure Description Patient positioned in bed supine.  Sedation given as noted above.  Patient was intubated with endotracheal tube using Glidescope.  View was Grade 1 full glottis .  Number of attempts was 1.  Colorimetric CO2 detector was consistent with tracheal placement.   Complications/Tolerance None; patient tolerated the procedure well. Chest X-ray is ordered to verify placement.   EBL Minimal   Specimen(s) None

## 2021-01-26 NOTE — ED Notes (Signed)
RN attempted to place IV 2x. Consulted IV team.

## 2021-01-26 NOTE — ED Notes (Signed)
EKG changes continuing to evolve, now w/ runs of wide complex tachycardia w/ pulses. Pt w/ increased ectopy, runs of Vtach and escape beats. Pt now maintaining tachycardia to the 140s-150s. Pressure holding at this time. MD Regenia Skeeter made aware of ongoing changes in pt condition

## 2021-01-26 NOTE — ED Notes (Signed)
Pt brief changed. Black tarry stool noted in brief.

## 2021-01-26 NOTE — Telephone Encounter (Signed)
Yes---I have already seen that he was admitted

## 2021-01-26 NOTE — ED Notes (Signed)
Staff cleaned pt and placed pt in clean brief & clean linens

## 2021-01-26 NOTE — ED Notes (Signed)
Patient with tachy rhythm and abnormal EKG and Deneise Lever RN immediately informed Regenia Skeeter and CCM. External pads placed, patient diaphoretic and dark coffee ground material coming from OG tube

## 2021-01-26 NOTE — Telephone Encounter (Signed)
Per chart review tab pt was seen in [facility] ED and pt was admitted. Sending note to Dr [name] as PCP who is out of office and Eugenia Pancoast FNP who is in office and [facility] CMA.

## 2021-01-26 NOTE — ED Notes (Signed)
Provided pt with cool wash cloth to place on head. Pt states he is feeling warm. Encourage to keep room door open since thermostat isn't in room

## 2021-01-26 NOTE — Code Documentation (Signed)
10u IV insulin given.

## 2021-01-26 NOTE — Code Documentation (Signed)
10u insulin given.

## 2021-01-26 NOTE — ED Notes (Signed)
Family at bedside in room

## 2021-01-26 NOTE — Code Documentation (Signed)
Patient time of death occurred at 89

## 2021-01-26 NOTE — Progress Notes (Signed)
°   01/12/2021 1912  Clinical Encounter Type  Visited With Family  Visit Type Initial;Spiritual support;Code  Referral From Nurse  Consult/Referral To None   Chaplain responded to a call from ED requesting support for a family of a patient. Patient suddenly declined and then died. Family members fluxed in size and distraught. We stayed in the consolation room until the nurse invited them to the patients room. Chaplain provided spiritual and emotional support to all members of the family praying with members were present as well as encouraging family to share special memories of time with the patient.    [place] Resident [place] [phone]

## 2021-01-26 NOTE — ED Provider Notes (Signed)
[facility] EMERGENCY DEPARTMENT Provider Note   CSN: [id number] Arrival date & time: 01/20/2021  2032     History  Chief Complaint  Patient presents with   Abdominal Pain   Rectal Bleeding    [name] is a 71 y.o. male.  The history is provided by the patient.  Abdominal Pain Associated symptoms: hematochezia   Rectal Bleeding Associated symptoms: abdominal pain   He has history of hypertension, diabetes, coronary artery disease, peripheral vascular disease, prostate cancer comes in because of rectal bleeding.  He started having dark red to black stools yesterday and has had bloody bowel movements 6 times.  He denies any nausea or vomiting.  He has had some crampy suprapubic pain.  He denies dizziness or lightheadedness.  He takes low-dose aspirin but denies taking other anticoagulants or antiplatelet agents.  He does have a history of prior gastrointestinal bleeding from diverticulosis.   Home Medications Prior to Admission medications   Medication Sig Start Date End Date Taking? Authorizing Provider  ACCU-CHEK GUIDE test strip USE AS DIRECTED TO CHECK BLOOD SUGAR 3 TIMES DAILY 07/14/19   Philemon Kingdom, MD  aspirin EC 81 MG tablet Take 81 mg by mouth daily. Swallow whole.    [provider]  carvedilol (COREG) 25 MG tablet TAKE 1 TABLET TWICE DAILY Patient taking differently: Take 25 mg by mouth in the morning and at bedtime. 07/11/20   [name], MD  cefdinir (OMNICEF) 300 MG capsule Take 1 capsule (300 mg total) by mouth 2 (two) times daily. 08/03/20   Raynelle Bring, MD  DROPLET PEN NEEDLES 31G X 5 MM MISC USE TO INJECT INSULIN FOUR TIMES DAILY AS INSTRUCTED (FOR DIABETES) 05/02/20   Philemon Kingdom, MD  insulin aspart (NOVOLOG FLEXPEN) 100 UNIT/ML FlexPen Inject 18-25 Units into the skin 3 (three) times daily with meals. 11/16/20   Philemon Kingdom, MD  insulin glargine, 2 Unit Dial, (TOUJEO MAX SOLOSTAR) 300 UNIT/ML Solostar Pen Inject  56 Units into the skin at bedtime. 11/16/20   Philemon Kingdom, MD  losartan-hydrochlorothiazide [facility]) 50-12.5 MG tablet TAKE 1 TABLET EVERY DAY 08/09/20   [name], MD  metFORMIN (GLUCOPHAGE) 1000 MG tablet Take 2 tablets (2,000 mg total) by mouth daily with supper. 05/11/20   Philemon Kingdom, MD  Multiple Vitamin (MULTIVITAMIN WITH MINERALS) TABS tablet Take 1 tablet by mouth daily. Centrum Silver for Men 50+    [provider]  mupirocin ointment (BACTROBAN) 2 % APPLY TO THE AFFECTED AREA TOPICALLY DAILY Patient taking differently: Apply 1 application topically daily as needed (irritation). 03/13/20   [name], MD  ondansetron (ZOFRAN) 4 MG tablet Take 4 mg by mouth every 6 (six) hours as needed for nausea/vomiting. 07/17/20   [provider]  tamsulosin (FLOMAX) 0.4 MG CAPS capsule Take 0.4 mg by mouth at bedtime.    [provider]  traMADol (ULTRAM) 50 MG tablet Take 1-2 tablets (50-100 mg total) by mouth every 6 (six) hours as needed (pain). 08/28/20   Raynelle Bring, MD      Allergies    Atorvastatin, Ezetimibe, and Simvastatin    Review of Systems   Review of Systems  Gastrointestinal:  Positive for abdominal pain and hematochezia.  All other systems reviewed and are negative.  Physical Exam Updated Vital Signs BP 119/66 (BP Location: Right Arm)    Pulse (!) 130    Temp 99.3 F (37.4 C) (Oral)    Resp (!) 32  Ht 6\' 2"  (1.88 m)    Wt 90.7 kg    SpO2 99%    BMI 25.68 kg/m  Physical Exam Vitals and nursing note reviewed.  72 year old male, resting comfortably and in no acute distress. Vital signs are significant for rapid heart rate and elevated respiratory rate. Oxygen saturation is 99%, which is normal. Head is normocephalic and atraumatic. PERRLA, EOMI. Oropharynx is clear. Neck is nontender and supple without adenopathy or JVD. Back is nontender and there is no CVA tenderness. Lungs are clear without rales, wheezes, or rhonchi. Chest  is nontender. Heart has regular rate and rhythm without murmur. Abdomen is soft, flat, nontender. Rectal: Slightly decreased sphincter tone.  Large amount of maroon to black stool present. Extremities: Bilateral above-the-knee amputations. Skin is warm and dry without rash. Neurologic: Mental status is normal, cranial nerves are intact, there are no motor or sensory deficits.  ED Results / Procedures / Treatments   Labs (all labs ordered are listed, but only abnormal results are displayed) Labs Reviewed  CBC WITH DIFFERENTIAL/PLATELET - Abnormal; Notable for the following components:      Result Value   RBC 3.78 (*)    Hemoglobin 9.8 (*)    HCT 30.0 (*)    MCV 79.4 (*)    MCH 25.9 (*)    RDW 18.5 (*)    nRBC 2.1 (*)    Basophils Absolute 0.2 (*)    nRBC 3 (*)    Abs Immature Granulocytes 0.30 (*)    All other components within normal limits  COMPREHENSIVE METABOLIC PANEL - Abnormal; Notable for the following components:   Sodium 131 (*)    Potassium 5.9 (*)    Glucose, Bld 297 (*)    BUN 64 (*)    Calcium 8.5 (*)    Total Protein 5.2 (*)    Albumin 2.7 (*)    Total Bilirubin 1.4 (*)    All other components within normal limits  HEMOGLOBIN AND HEMATOCRIT, BLOOD - Abnormal; Notable for the following components:   Hemoglobin 9.5 (*)    HCT 27.6 (*)    All other components within normal limits  LIPASE, BLOOD  URINALYSIS, ROUTINE W REFLEX MICROSCOPIC  TYPE AND SCREEN    EKG None  Radiology CT Abdomen Pelvis W Contrast  Result Date: 01/30/2021 CLINICAL DATA:  Rectal bleeding. EXAM: CT ABDOMEN AND PELVIS WITH CONTRAST TECHNIQUE: Multidetector CT imaging of the abdomen and pelvis was performed using the standard protocol following bolus administration of intravenous contrast. RADIATION DOSE REDUCTION: This exam was performed according to the departmental dose-optimization program which includes automated exposure control, adjustment of the mA and/or kV according to patient  size and/or use of iterative reconstruction technique. CONTRAST:  165mL OMNIPAQUE IOHEXOL 300 MG/ML  SOLN COMPARISON:  November 02, 2020 FINDINGS: Lower chest: No acute abnormality. Hepatobiliary: No focal liver abnormality is seen. Numerous subcentimeter gallstones are seen within the lumen of an otherwise normal-appearing gallbladder. There is no evidence of biliary dilatation. Pancreas: Unremarkable. No pancreatic ductal dilatation or surrounding inflammatory changes. Spleen: A 1.9 cm x 1.1 cm area of predominant homogeneous low attenuation (approximately 49.88 Hounsfield units) is seen within the anterior aspect of a markedly enlarged spleen. Adrenals/Urinary Tract: A 1.4 cm x 1.1 cm, nonspecific, fat density (approximately -22.71 Hounsfield units) left adrenal mass is seen. A 1.4 cm x 1.1 cm nonspecific low-attenuation right adrenal mass is also seen. Kidneys are normal in size. A 1.2 cm diameter simple renal cyst is seen within  the lateral aspect of the mid left kidney. Bilateral subcentimeter nonobstructing renal calculi are seen within the mid right kidney and lower pole of the left kidney. The urinary bladder is mildly distended and otherwise unremarkable. Stomach/Bowel: Stomach is within normal limits. Appendix appears normal. No evidence of bowel wall thickening, distention, or inflammatory changes. Vascular/Lymphatic: Aortic atherosclerosis. No enlarged abdominal or pelvic lymph nodes. Reproductive: Numerous prostate radiation implantation seeds are seen. Other: No abdominal wall hernia or abnormality. No abdominopelvic ascites. Musculoskeletal: IMPRESSION: 1. Cholelithiasis without evidence of acute cholecystitis. 2. Markedly enlarged spleen with a 1.9 cm x 1.1 cm area of predominant homogeneous low attenuation. This may represent a splenic infarct. 3. 1.4 cm left adrenal mass, consistent with benign myelolipoma. No follow-up imaging is recommended. JACR 2017 Aug; 14(8):1038-44, JCAT 2016 Mar-Apr;  40(2):194-200, Urol J 2006 Spring; 3(2):71-4. 4. Additional nonspecific low-attenuation right adrenal mass which may represent an adrenal adenoma. Correlation with follow-up adrenal protocol abdomen and pelvis CT is recommended. 5. Bilateral subcentimeter nonobstructing renal calculi. 6. Prostate radiation implantation seeds. 7. Aortic atherosclerosis Aortic Atherosclerosis (ICD10-I70.0). Electronically Signed   By: Virgina Norfolk M.D.   On: 01/12/2021 03:33    Procedures Procedures  Cardiac monitor shows sinus tachycardia per my interpretation.  Medications Ordered in ED Medications  iohexol (OMNIPAQUE) 300 MG/ML solution 100 mL (100 mLs Intravenous Contrast Given 01/31/2021 0316)    ED Course/ Medical Decision Making/ A&P                           Medical Decision Making Amount and/or Complexity of Data Reviewed Labs: ordered.  Risk Prescription drug management.   Gastrointestinal bleeding with most likely source being diverticular.  Consider angiodysplasia, peptic ulcer disease.  Hemoglobin is 9.8, which is a considerable drop from last reported hemoglobin of 14.1 on 11/02/2020.  He is significantly tachycardic, will give IV fluids and blood transfusion.  Old records are reviewed, and he was hospitalized on 04/27/2019 with lower GI bleeding which was felt to be due to diverticulosis.  Other labs show mild hyponatremia which is not felt to be clinically significant, significantly elevated BUN which could be consistent fourth upper GI bleed, elevated potassium which is most likely to be factitious in the absence of significant renal insufficiency.  ECG shows sinus tachycardia with nonspecific repolarization changes which are felt to be most likely rate related.  CT scan had been obtained showing no acute process.  Case is discussed with Dr. Alcario Drought of [facility], who agrees to admit the patient.  Case is also discussed with Dr. Lyndel Safe of gastroenterology service who agrees to see the  patient in consultation.  CRITICAL CARE Performed by: Delora Fuel Total critical care time: 55 minutes Critical care time was exclusive of separately billable procedures and treating other patients. Critical care was necessary to treat or prevent imminent or life-threatening deterioration. Critical care was time spent personally by me on the following activities: development of treatment plan with patient and/or surrogate as well as nursing, discussions with consultants, evaluation of patient's response to treatment, examination of patient, obtaining history from patient or surrogate, ordering and performing treatments and interventions, ordering and review of laboratory studies, ordering and review of radiographic studies, pulse oximetry and re-evaluation of patient's condition.       Final Clinical Impression(s) / ED Diagnoses Final diagnoses:  Acute GI bleeding  Anemia associated with acute blood loss  Hyponatremia    Rx / DC Orders ED Discharge Orders  None         Delora Fuel, MD 37/02/30 [phone]

## 2021-01-26 NOTE — ED Notes (Signed)
Pt w/ narrow complex tachycardia to the 150s-160s immediately following intubation. CCM MD at bedside aware of rhythm, repeat EKG shot and given to CCM MD. No new orders at this time

## 2021-01-26 NOTE — Code Documentation (Signed)
10u IV insulin

## 2021-01-26 NOTE — ED Notes (Signed)
Pt is more confused and pulling at various cords/wires. Pt's respirations have increased and is working harder.

## 2021-01-26 NOTE — ED Notes (Signed)
MD Rosilyn Mings and MD Regenia Skeeter at bedside to place central line and assess pt

## 2021-01-26 NOTE — Code Documentation (Signed)
Ventricular fibrillation rhythm noted on cardiac monitor. Shock delivered by Dr. Regenia Skeeter

## 2021-01-26 NOTE — H&P (Addendum)
History and Physical    [name]:[id number] DOB: 08/04/1950 DOA: 01/22/2021  Referring MD/NP/PA: Jennette Kettle, DO PCP: [name], MD  Patient coming from: Home  Chief Complaint: Black stools and abdominal  I have personally briefly reviewed patient's old medical records in [place]   HPI: [name] is a 71 y.o. male with medical history significant of hyperlipidemia, CAD, PAD, diabetes, prostate cancer, GI bleed, diverticulosis, colonic polyps, remote tobacco abuse, and OSA presents with complaints of black stools and abdominal pain since yesterday.  He has had at least 6 bowel movements since symptoms started.  Notes associated symptoms of crampy right lower quadrant abdominal pain, nausea, discomfort with urination, and some shortness of breath.  Shortness of breath improves with sitting up.  He takes a baby aspirin daily and uses Aleve at least once a week for joint pains.  Denies vomiting, chest pain, or fever.  His girlfriend is present at bedside and notes that he was up all night and seemed to be a little altered this morning.  He has a previous history of GI bleed back in 04/2019 which was thought to be likely diverticular at the time.  Patient underwent EGD and colonoscopy with Dr. Lyndel Safe.  EGD noted a small hiatal hernia, but no evidence of upper GI bleeding at that time although examination was limited due to patient becoming combative.  Colonoscopy noted pancolonic diverticulosis predominantly in the sigmoid colon, mild radiation proctitis, a few small localized angiodysplastic lesions without bleeding in the rectum, and moderate nonbleeding hemorrhoids.  ED Course: Upon admission into the emergency department patient was noted to be afebrile, pulse 117-134, respiration 15-32, blood pressures 117/54-172/53, and O2 saturation maintained on room air.  Labs since 1/19 significant for hemoglobin 9.8-> 9.5 (previously 14.1 on 11/02/20), sodium 131, potassium 5.9,  BUN 64, creatinine 1.24, and total bilirubin 1.4.  CT scan of abdomen and pelvis significant for cholelithiasis without evidence of acute cholecystitis, markedly enlarged spleen, bilateral adrenal masses (right mass may represent adrenal adenoma), nonobstructing bilateral renal calculi, and prostate radiation implantation seeds.  Urinalysis noted moderate leukocytes, rare bacteria, 11-20 WBCs.  He was typed and screened for blood products.  Gastroenterology has been consulted.  Patient was given 1 L of lactated Ringer's, fentanyl 50 mg IV, and ordered to be transfused 2 units of packed red blood cells.  Review of Systems  Constitutional:  Positive for diaphoresis (Chronic) and malaise/fatigue.  HENT:  Negative for congestion and sinus pain.   Eyes:  Negative for photophobia.  Respiratory:  Positive for shortness of breath. Negative for cough.   Cardiovascular:  Negative for chest pain and leg swelling.  Gastrointestinal:  Positive for abdominal pain, melena and nausea. Negative for vomiting.  Genitourinary:  Positive for dysuria.  Musculoskeletal:  Positive for joint pain.  Neurological:  Negative for loss of consciousness.  Psychiatric/Behavioral:  Negative for substance abuse.    Past Medical History:  Diagnosis Date   Basal cell carcinoma 02/23/2019   nodular/infiltrative- right forearm-mohs   Cellulitis and abscess of left lower extremity 12/20/2017   Coronary atherosclerosis of unspecified type of vessel, native or graft    Diabetes mellitus without complication ([place])    Hemorrhage of rectum and anus    History of kidney stones    passed stones   Hypertension    Impotence of organic origin    Internal hemorrhoids without mention of complication    Malignant neoplasm of prostate ([place])    Mononeuritis  of unspecified site    Osteoarthrosis, unspecified whether generalized or localized, unspecified site    Other and unspecified hyperlipidemia    Peripheral vascular disease ([place])    B  BKA   Personal history of colonic polyps    Personal history of gallstones    Subacute osteomyelitis, right ankle and foot ([place])    Type II or unspecified type diabetes mellitus with neurological manifestations, not stated as uncontrolled(250.60)    Unspecified glaucoma(365.9)    Unspecified sleep apnea    cpcp    Past Surgical History:  Procedure Laterality Date   AMPUTATION Left 12/30/2012   Procedure: AMPUTATION RAY ;  Surgeon: Meredith Pel, MD;  Location: [facility];  Service: Orthopedics;  Laterality: Left;  LEFT GREAT TOE RAY AMPUTATION   AMPUTATION Left 02/23/2016   Procedure: Left Below Knee Amputation;  Surgeon: [name], MD;  Location: [place];  Service: Orthopedics;  Laterality: Left;   AMPUTATION Left 12/24/2017   Procedure: LEFT ABOVE KNEE AMPUTATION;  Surgeon: [name], MD;  Location: [place];  Service: Orthopedics;  Laterality: Left;   AMPUTATION Right 12/24/2017   Procedure: RIGHT 5TH RAY AMPUTATION;  Surgeon: [name], MD;  Location: [place];  Service: Orthopedics;  Laterality: Right;   AMPUTATION Right 12/11/2018   Procedure: RIGHT ABOVE KNEE AMPUTATION;  Surgeon: [name], MD;  Location: [place];  Service: Orthopedics;  Laterality: Right;   APPENDECTOMY     BASAL CELL CARCINOMA EXCISION  2/16   left forearm   [place]   Negative   CATARACT EXTRACTION Right 2017   then lid surgery   COLONOSCOPY     COLONOSCOPY WITH PROPOFOL N/A 04/29/2019   Procedure: COLONOSCOPY WITH PROPOFOL;  Surgeon: Jackquline Denmark, MD;  Location: [facility];  Service: Endoscopy;  Laterality: N/A;   CORONARY ARTERY BYPASS GRAFT  09/2005   5 bypasses per patient, Post op AFIB   CYSTOSCOPY/URETEROSCOPY/HOLMIUM LASER/STENT PLACEMENT Left 08/03/2020   Procedure: CYSTOSCOPY/ RETROGRADE/STENT PLACEMENT;  Surgeon: Raynelle Bring, MD;  Location: [facility];  Service: Urology;  Laterality: Left;   CYSTOSCOPY/URETEROSCOPY/HOLMIUM LASER/STENT  PLACEMENT Left 08/28/2020   Procedure: CYSTOSCOPY/RETROGRADE/URETEROSCOPY/HOLMIUM LASER/STENT PLACEMENT;  Surgeon: Raynelle Bring, MD;  Location: [facility];  Service: Urology;  Laterality: Left;   ESOPHAGOGASTRODUODENOSCOPY (EGD) WITH PROPOFOL N/A 04/29/2019   Procedure: ESOPHAGOGASTRODUODENOSCOPY (EGD) WITH PROPOFOL;  Surgeon: Jackquline Denmark, MD;  [facility];  Service: Endoscopy;  Laterality: N/A;   EYE SURGERY     FOOT BONE EXCISION Left 11/03/2013   DR DUDA    I & D EXTREMITY Left 11/03/2013   Procedure: Left Foot Partial Bone Excision Cuboid and Medial Cuneiform, Wound Closures;  Surgeon: [name], MD;  Location: [place];  Service: Orthopedics;  Laterality: Left;   INSERTION PROSTATE RADIATION SEED  2009   RT and seeds for prostate cancer   KIDNEY STONE SURGERY  04/1993   RCA stents  04/2003   EF 55%   RETINAL DETACHMENT SURGERY  2002-2003   thrombosed vein  1993   Right leg     reports that he quit smoking about 20 years ago. His smoking use included cigars and cigarettes. He has a 70.00 pack-year smoking history. He has never used smokeless tobacco. He reports that he does not currently use alcohol. He reports that he does not use drugs.  Allergies  Allergen Reactions   Atorvastatin Other (See Comments)    myalgias   Ezetimibe Other (  See Comments)    Body ache   Simvastatin Other (See Comments)    Body ache    Family History  Problem Relation Age of Onset   Lung cancer Father    Multiple sclerosis Mother    Heart attack Other        paternal aunts and uncles   Peripheral vascular disease Maternal Grandfather        several amputations    Prior to Admission medications   Medication Sig Start Date End Date Taking? Authorizing Provider  ACCU-CHEK GUIDE test strip USE AS DIRECTED TO CHECK BLOOD SUGAR 3 TIMES DAILY Patient taking differently: in the morning and at bedtime. 07/14/19  Yes Philemon Kingdom, MD  aspirin EC 81 MG tablet Take 81 mg by mouth daily.  Swallow whole.   Yes [provider]  carvedilol (COREG) 25 MG tablet TAKE 1 TABLET TWICE DAILY Patient taking differently: Take 25 mg by mouth in the morning and at bedtime. 07/11/20  Yes [name], MD  DROPLET PEN NEEDLES 31G X 5 MM MISC USE TO INJECT INSULIN FOUR TIMES DAILY AS INSTRUCTED (FOR DIABETES) 05/02/20  Yes Philemon Kingdom, MD  insulin aspart (NOVOLOG FLEXPEN) 100 UNIT/ML FlexPen Inject 18-25 Units into the skin 3 (three) times daily with meals. Patient taking differently: Inject 18-25 Units into the skin 3 (three) times daily with meals. Per sliding scale 11/16/20  Yes Philemon Kingdom, MD  insulin glargine, 2 Unit Dial, (TOUJEO MAX SOLOSTAR) 300 UNIT/ML Solostar Pen Inject 56 Units into the skin at bedtime. Patient taking differently: Inject 55 Units into the skin at bedtime. 11/16/20  Yes Philemon Kingdom, MD  losartan-hydrochlorothiazide [facility] [place]) 50-12.5 MG tablet TAKE 1 TABLET EVERY DAY 08/09/20  Yes [name], MD  metFORMIN (GLUCOPHAGE) 1000 MG tablet Take 2 tablets (2,000 mg total) by mouth daily with supper. 05/11/20  Yes Philemon Kingdom, MD  Multiple Vitamin (MULTIVITAMIN WITH MINERALS) TABS tablet Take 1 tablet by mouth daily. Centrum Silver for Men 50+   Yes [provider]  naproxen sodium (ALEVE) 220 MG tablet Take 440 mg by mouth daily as needed (joint pain).   Yes [provider]  ondansetron (ZOFRAN) 4 MG tablet Take 4 mg by mouth every 6 (six) hours as needed for nausea/vomiting. 07/17/20  Yes [provider]  tamsulosin (FLOMAX) 0.4 MG CAPS capsule Take 0.8 mg by mouth at bedtime.   Yes [provider]  cefdinir (OMNICEF) 300 MG capsule Take 1 capsule (300 mg total) by mouth 2 (two) times daily. Patient not taking: Reported on 01/12/2021 08/03/20   Raynelle Bring, MD  mupirocin ointment (BACTROBAN) 2 % APPLY TO THE AFFECTED AREA TOPICALLY DAILY Patient not taking: Reported on 01/17/2021 03/13/20   [name], MD   traMADol (ULTRAM) 50 MG tablet Take 1-2 tablets (50-100 mg total) by mouth every 6 (six) hours as needed (pain). Patient not taking: Reported on 01/10/2021 08/28/20   Raynelle Bring, MD    Physical Exam:  Constitutional: Elderly male who appears to be in some distress Vitals:   02/02/2021 0323 02/02/2021 0601 01/30/2021 0615 01/17/2021 0645  BP: 120/62 119/66 (!) 172/53 (!) 146/60  Pulse: (!) 129 (!) 130 (!) 132 (!) 134  Resp: 15 (!) 32 (!) 22 (!) 25  Temp:  99.3 F (37.4 C)    TempSrc:  Oral    SpO2: 99% 99% 100% 99%  Weight:      Height:       Eyes: PERRL, lids and conjunctivae  normal ENMT: Mucous membranes are moist. Posterior pharynx clear of any exudate or lesions. Neck: normal, supple, no masses, no thyromegaly Respiratory: Mildly tachypneic.  Clear to auscultation bilaterally, no wheezing, no crackles.  Cardiovascular: Tachycardic, no murmurs / rubs / gallops. No extremity edema  Abdomen: Tenderness palpation of the lower right quadrant of the abdomen.  Bowel sounds present. Musculoskeletal: no clubbing / cyanosis.  Bilateral above-knee amputations. Skin: no rashes, lesions, ulcers. No induration Neurologic: CN 2-12 grossly intact.  Able to move all extremities Psychiatric: Normal judgment and insight.  Lethargic but oriented x3.    Labs on Admission: I have personally reviewed following labs and imaging studies  CBC: Recent Labs  Lab 01/09/2021 2121 01/22/2021 0547  WBC 7.6  --   NEUTROABS 6.1  --   HGB 9.8* 9.5*  HCT 30.0* 27.6*  MCV 79.4*  --   PLT 223  --    Basic Metabolic Panel: Recent Labs  Lab 01/24/2021 2121  NA 131*  K 5.9*  CL 99  CO2 22  GLUCOSE 297*  BUN 64*  CREATININE 1.24  CALCIUM 8.5*   GFR: Estimated Creatinine Clearance: 62.6 mL/min (by C-G formula based on SCr of 1.24 mg/dL). Liver Function Tests: Recent Labs  Lab 01/11/2021 2121  AST 26  ALT 15  ALKPHOS 48  BILITOT 1.4*  PROT 5.2*  ALBUMIN 2.7*   Recent Labs  Lab 01/22/2021 2121   LIPASE 50   No results for input(s): AMMONIA in the last 168 hours. Coagulation Profile: No results for input(s): INR, PROTIME in the last 168 hours. Cardiac Enzymes: No results for input(s): CKTOTAL, CKMB, CKMBINDEX, TROPONINI in the last 168 hours. BNP (last 3 results) No results for input(s): PROBNP in the last 8760 hours. HbA1C: No results for input(s): HGBA1C in the last 72 hours. CBG: No results for input(s): GLUCAP in the last 168 hours. Lipid Profile: No results for input(s): CHOL, HDL, LDLCALC, TRIG, CHOLHDL, LDLDIRECT in the last 72 hours. Thyroid Function Tests: No results for input(s): TSH, T4TOTAL, FREET4, T3FREE, THYROIDAB in the last 72 hours. Anemia Panel: No results for input(s): VITAMINB12, FOLATE, FERRITIN, TIBC, IRON, RETICCTPCT in the last 72 hours. Urine analysis:    Component Value Date/Time   COLORURINE YELLOW 02/03/2021 0617   APPEARANCEUR CLEAR 01/11/2021 0617   LABSPEC 1.029 01/17/2021 0617   PHURINE 5.0 01/24/2021 0617   GLUCOSEU 50 (A) 01/08/2021 0617   HGBUR NEGATIVE 01/15/2021 0617   BILIRUBINUR NEGATIVE 01/12/2021 0617   BILIRUBINUR neg 04/21/2012 0904   KETONESUR 5 (A) 02/03/2021 0617   PROTEINUR NEGATIVE 01/09/2021 0617   UROBILINOGEN negative 04/21/2012 0904   NITRITE NEGATIVE 02/03/2021 0617   LEUKOCYTESUR MODERATE (A) 02/03/2021 0617   Sepsis Labs: No results found for this or any previous visit (from the past 240 hour(s)).   Radiological Exams on Admission: CT Abdomen Pelvis W Contrast  Result Date: 01/09/2021 CLINICAL DATA:  Rectal bleeding. EXAM: CT ABDOMEN AND PELVIS WITH CONTRAST TECHNIQUE: Multidetector CT imaging of the abdomen and pelvis was performed using the standard protocol following bolus administration of intravenous contrast. RADIATION DOSE REDUCTION: This exam was performed according to the departmental dose-optimization program which includes automated exposure control, adjustment of the mA and/or kV according to  patient size and/or use of iterative reconstruction technique. CONTRAST:  124mL OMNIPAQUE IOHEXOL 300 MG/ML  SOLN COMPARISON:  November 02, 2020 FINDINGS: Lower chest: No acute abnormality. Hepatobiliary: No focal liver abnormality is seen. Numerous subcentimeter gallstones are seen within the lumen of an  otherwise normal-appearing gallbladder. There is no evidence of biliary dilatation. Pancreas: Unremarkable. No pancreatic ductal dilatation or surrounding inflammatory changes. Spleen: A 1.9 cm x 1.1 cm area of predominant homogeneous low attenuation (approximately 49.88 Hounsfield units) is seen within the anterior aspect of a markedly enlarged spleen. Adrenals/Urinary Tract: A 1.4 cm x 1.1 cm, nonspecific, fat density (approximately -22.71 Hounsfield units) left adrenal mass is seen. A 1.4 cm x 1.1 cm nonspecific low-attenuation right adrenal mass is also seen. Kidneys are normal in size. A 1.2 cm diameter simple renal cyst is seen within the lateral aspect of the mid left kidney. Bilateral subcentimeter nonobstructing renal calculi are seen within the mid right kidney and lower pole of the left kidney. The urinary bladder is mildly distended and otherwise unremarkable. Stomach/Bowel: Stomach is within normal limits. Appendix appears normal. No evidence of bowel wall thickening, distention, or inflammatory changes. Vascular/Lymphatic: Aortic atherosclerosis. No enlarged abdominal or pelvic lymph nodes. Reproductive: Numerous prostate radiation implantation seeds are seen. Other: No abdominal wall hernia or abnormality. No abdominopelvic ascites. Musculoskeletal: IMPRESSION: 1. Cholelithiasis without evidence of acute cholecystitis. 2. Markedly enlarged spleen with a 1.9 cm x 1.1 cm area of predominant homogeneous low attenuation. This may represent a splenic infarct. 3. 1.4 cm left adrenal mass, consistent with benign myelolipoma. No follow-up imaging is recommended. JACR 2017 Aug; 14(8):1038-44, JCAT 2016  Mar-Apr; 40(2):194-200, Urol J 2006 Spring; 3(2):71-4. 4. Additional nonspecific low-attenuation right adrenal mass which may represent an adrenal adenoma. Correlation with follow-up adrenal protocol abdomen and pelvis CT is recommended. 5. Bilateral subcentimeter nonobstructing renal calculi. 6. Prostate radiation implantation seeds. 7. Aortic atherosclerosis Aortic Atherosclerosis (ICD10-I70.0). Electronically Signed   By: Virgina Norfolk M.D.   On: 01/29/2021 03:33    EKG: Independently reviewed.  Sinus tachycardia 129 bpm  Assessment/Plan  Acute blood loss secondary to GI bleed: Patient presents with complaints of abdominal pain and rectal bleeding.  He admits to using daily aspirin as well as Aleve at least once a week.  Hemoglobin down to 9.5 g/dL, but previously had been 14.1 in 10/2020.  CT scan of the abdomen pelvis did not point to clear source.  Patient had been ordered 2 units of packed red blood cells to be transfused.  Question the possibility of an upper GI bleed source given the significantly elevated BUN to creatinine ratio.   -Admit to a progressive bed -N.p.o. except for meds -Monitor intake and output Serial monitoring of H&H -Continue transfusion of 2 units of packed red blood cells -Protonix drip per protocol -Appreciate GI consultative services, we will follow-up for further recommendation  SIRS  lactic acidosis: Acute.  Patient was initially noted to be tachycardic and tachypneic meeting SIRS criteria.  Lactic acid initially elevated 2.8.  Suspect symptoms secondary to above, but patient was noted to have abnormal urinalysis. -Check urine and blood culture  Possible urinary tract infection: Acute.  Patient reports that he had some discomfort with urination for the last day or so.  Urinalysis was significant for 50 glucose, 5 ketones, moderate leukocytes, rare bacteria, and 11-20 WBCs.  Question -Follow-up urine culture -Rocephin IV   Acute encephalopathy: Patient's  fianc noted that he was altered this morning after not sleeping last night.  Normally on CPAP. -Neuro check  Hyperkalemia: Acute.  Potassium 5.9 yesterday when initially check.  He had received IV fluids. -Recheck potassium level  Dyspnea: Acute.  Patient complains of short of breath and what sounds like orthopnea as patient is receiving blood products.  Denies having any cough.  O2 saturation currently maintained on room air. -Continuous pulse oximetry -Check chest x-ray -Lasix IV 20 mg x 1 dose  Acute kidney injury: Creatinine elevated at 1.24 with BUN 64.  Baseline creatinine previously noted to be 0.86 in 10/2020.  The elevated BUN to creatinine ratio suggest prerenal cause likely related with acute blood loss.  Patient has been given 1 L of lactated Ringer's and ordered to be transfused blood. -Continue to monitor kidney function -Avoid nephrotoxic agents at this time.  Diabetes mellitus type 2, with long-term use of insulin: On admission glucose elevated up to 297.  Last hemoglobin A1c 5.8 11/16/2020.  Home medication regimen includes glargine 55 units nightly, metformin 2000 mg twice daily, and NovoLog 18-25 units 3 times daily with meals. -Hypoglycemic protocols -CBGs every 6 hours with resistant SSI -Pharmacy substitution of Semglee for glargine insulin reduced to 30 units nightly in case patient to be n.p.o. for possible procedure -Adjust insulin regimen as needed  Essential hypertension: Blood pressures noted to be 117/54-172/53.  Patient was found to be tachycardic into the 130s.  Home blood pressure regimen includes Coreg 25 mg twice daily, and losartan-hydrochlorothiazide 50-12.5 mg daily. -Continue Coreg as this may be related to missed doses in the process of being evaluated in the ED -Resume losartan-hydrochlorothiazide when medically appropriate  CAD s/p CABG -Held aspirin due to GI bleed  Cholelithiasis: Noted on CT scan of the abdomen and pelvis but no signs of  cholecystitis at this time.  LFTs relatively within normal limits total bilirubin just mildly elevated. -Check abdominal ultrasound  Spleen enlarged: Acute.  Spleen was noted to be significantly enlarged with a 1.9 x 1.1 area of low-attenuation.  Possibly concern for splenic infarct.   -Follow-up ultrasound  Bilateral adrenal masses: Left adrenal mass thought to be possibly benign myelolipoma and right adrenal mass thought to be possibly a adrenal adenoma for which adrenal protocol CT of abdomen and pelvis abdomen and pelvis was recommended. -Recommend outpatient follow-up with PCP to have adrenal protocol CT and further work-up  Peripheral vascular disease s/p above below-knee amputations  BPH -Continue Flomax  Hyperbilirubinemia: Total bilirubin 1.4, but had also been elevated at 1.5 back in 10/2020. -Continue to monitor  History of prostate cancer: Status post radiation seed placement.  OSA  -Continue CPAP nightly  DVT prophylaxis: SCDs Code Status: Full Family Communication: Girlfriend updated at Disposition Plan: Hopefully discharge home once medically stable Consults called: GI Admission status: Observation  Norval Morton MD [facility]   If 7PM-7AM, please contact night-coverage   01/13/2021, 7:18 AM

## 2021-01-26 NOTE — ED Notes (Signed)
Ultrasound is unable to complete scan at the moment d/t pt not able to lay flat for long periods of times. RN will call ultrasound back once pt receive CPAP machine to see if that will be easier for pt to complete scan.

## 2021-01-26 NOTE — ED Notes (Signed)
E-link provider stated to this RN that she will speak with Dr. Erin Fulling in the morning and remind him of the need for a death certificate on pt.

## 2021-01-26 NOTE — Consult Note (Addendum)
Consultation  Referring Provider:     Dr. Fuller Plan Primary Care Physician:  [name], MD Primary Gastroenterologist: Dr. Fuller Plan but last colon/EGD Dr. Lyndel Safe inpatient for GI bleed   Reason for Consultation:   hematochezia, anemia     Attending physician's note  I have taken a history, reviewed the chart and examined the patient. I performed a substantive portion of this encounter, including complete performance of at least one of the key components, in conjunction with the APP. I agree with the APP's note, impression and recommendations.    71 year old gentleman with hypertension, diabetes, CAD s/p CABG, peripheral vascular disease s/p bilateral lower extremity amputation, prostate cancer status postradiation presented with acute painless hematochezia  Reviewed recent colonoscopy by Dr. Lyndel Safe, had significant diverticular disease and mild radiation proctitis  Presentation is concerning for acute diverticular hemorrhage  He has not had a bowel movement since he presented to the ER Monitor hemoglobin and transfuse as needed  He is short of breath, tachypneic and tachycardic He also has significant orthopnea Fluid management and diuresis per primary team  If he develops recurrent episode of hematochezia, please order stat RBC tagged scan to localize the area followed by CTA/IR consult for embolization  Dr. Benson Norway is covering for [facility] this weekend, he will round on the patient   The patient was provided an opportunity to ask questions and all were answered. The patient agreed with the plan and demonstrated an understanding of the instructions.  Damaris Hippo , MD [phone]      Impression    Hematochezia WBC 7.6 HGB 9.5 MCV 79.4 Platelets 223 BUN 64 (13 on 11/02/20) Cr 1.24 (0.86) Patient has had diverticular bleed in the past, 2021 with EGD/Colon unremarkable.  With clinical presentation of large painless hematochezia, most likely this this  diverticular bleed with history.  No upper GI symptoms, normal endoscopy 2021 other than small hiatal hernia.  Mild NSAID use. BUN increased however shows creatinine hours.  SIRS, lactic acidosis Patient with tachycardia, tachypneic being managed by primary Possible UTI from urinalysis  Acute encephalopathy Being evaluated, no asterixis on exam. No evidence of cirrhosis  Splenomegaly No signs of portal hypertension, no thrombocytopenia  Cholelithiasis AST 26 ALT 15 Alkphos 48 TBili 1.4 LFTs within normal limits, total bili slightly elevated No ductal dilatation, pending abdominal ultrasound  Peripheral vascular disease status post BKA, sleep apnea, CAD, hyperkalemia.    Plan   -Continue Protonix, can switch to IV 40 mg twice daily -Believe this is likely more diverticular at this time, suggest CTA, supportive care with SIRS criteria tachycardia, Protonix 40 mg twice daily -Will recommend CTA followed by IR consultation if positive. --Continue to monitor H&H with transfusion as needed to maintain hemoglobin greater than 8 given cardiac history. -Last colonoscopy was 2021 unremarkable other than pandiverticulosis,  likely diverticular bleed if CTA negative, can consider EGD- will discuss with Dr. Silverio Decamp  Thank you for your kind consultation, we will continue to follow.          HPI:   [name] is a 71 y.o. male with past medical history significant for OSA, diabetes, hypertension, kidney stones, coronary artery disease status post CABG 2007, peripheral vascular disease status post bilateral BKA prostate cancer treated with radiation seed implantation presents with dark stools stools and abdominal pain.  Patient was seen 04/29/2019 inpatient for rectal bleeding, had colonoscopy that showed pan colonic diverticulosis, mild radiation proctitis without bleeding, nonbleeding internal hemorrhoids,  thought to be diverticular bleed.  Endoscopy at same visit showed small hiatal  hernia but no evidence of upper GI bleeding.  Nephew and daughter [name] are in the room.  Patient and family contribute to history. Daughter states patient has been feeling well for about 2 weeks.  Having increasing dyspnea, denies chest pain or dizziness. Some conflicting stories with nephew staying patient has had some bleeding for 2 weeks versus daughter saying just occurred last night.  Also patient states he hurts all over, denies abdominal pain, girlfriend that was with him states was having abdominal discomfort she is not here at this time. Patient states dark maroon blood large volume, darker blood. Patient denies reflux, nausea, vomiting, dysphagia, odynophagia. Daughter does state last week and had a temperature of 100.2, denies chills, cough. Patient is on 1 to 2 pills, 2-3 times a week for pain. Patient on low-dose aspirin but denies any other blood thinners.  ED course: Patient afebrile, tachycardic, tachypneic in ER with normal blood pressure and oxygen saturations. Hemoglobin 9.5, previously 14.1 11/02/2020-patient being transfused 2 units packed red blood cells. Potassium 5.9, BUN 64, creatinine 1.24, total bilirubin 1.4 CT abdomen pelvis significant for cholelithiasis without evidence of acute cholecystitis, marked enlarged spleen, bilateral adrenal masses, prostate radiation implantation seeds Urine with rare bacteria and moderate leukocytosis.  Previous GI work up: Last office visit 12/13/2009 Dr. Fuller Plan  CTAB and pelvis with contrast 1. Cholelithiasis without evidence of acute cholecystitis. 2. Markedly enlarged spleen with a 1.9 cm x 1.1 cm area of predominant homogeneous low attenuation. This may represent a splenic infarct. 3. 1.4 cm left adrenal mass, consistent with benign myelolipoma. No follow-up imaging is recommended. JACR 2017 Aug; 14(8):1038-44, JCAT 2016 Mar-Apr; 40(2):194-200, Urol J 2006 Spring; 3(2):71-4. 4. Additional nonspecific low-attenuation right  adrenal mass which may represent an adrenal adenoma. Correlation with follow-up adrenal protocol abdomen and pelvis CT is recommended. 5. Bilateral subcentimeter nonobstructing renal calculi. 6. Prostate radiation implantation seeds. 7. Aortic atherosclerosis  04/29/2019 Colonoscopy for rectal bleeding -Pancolonic diverticulosis predominantly in the sigmoid colon. -Very mild XRT proctitis. No bleeding. -Non-bleeding internal hemorrhoids. -Otherwise normal colonoscopy to TI. No active bleeding. He likely had diverticular bleed. 04/29/2019 upper endoscopy for recent GI bleed Small hiatal hernia. - No evidence of upper GI bleeding  2009 colonoscopy: Adenomatous colon polyp, Diverticulosis, internal hemorrhoids.  11/2009 flexible sigmoidoscopy for hematochezia.  This showed radiation proctitis, small hemorrhoids and mild rectal to descending colon diverticulosis  Past Medical History:  Diagnosis Date   Basal cell carcinoma 02/23/2019   nodular/infiltrative- right forearm-mohs   Cellulitis and abscess of left lower extremity 12/20/2017   Coronary atherosclerosis of unspecified type of vessel, native or graft    Diabetes mellitus without complication (HCC)    Hemorrhage of rectum and anus    History of kidney stones    passed stones   Hypertension    Impotence of organic origin    Internal hemorrhoids without mention of complication    Malignant neoplasm of prostate (HCC)    Mononeuritis of unspecified site    Osteoarthrosis, unspecified whether generalized or localized, unspecified site    Other and unspecified hyperlipidemia    Peripheral vascular disease ([place])    B BKA   Personal history of colonic polyps    Personal history of gallstones    Subacute osteomyelitis, right ankle and foot ([place])    Type II or unspecified type diabetes mellitus with neurological manifestations, not stated as uncontrolled(250.60)    Unspecified glaucoma(365.9)  Unspecified sleep apnea    cpcp     Surgical History:  He  has a past surgical history that includes Cardiac catheterization (1998); thrombosed vein (1993); Kidney stone surgery (04/1993); Retinal detachment surgery (2002-2003); RCA stents (04/2003); Insertion prostate radiation seed (2009); Amputation (Left, 12/30/2012); Eye surgery; Foot bone excision (Left, 11/03/2013); I & D extremity (Left, 11/03/2013); Excision basal cell carcinoma (2/16); Cataract extraction (Right, 2017); Appendectomy; Amputation (Left, 02/23/2016); Amputation (Left, 12/24/2017); Amputation (Right, 12/24/2017); Colonoscopy; Coronary artery bypass graft (09/2005); Blepharoptosis repair; Amputation (Right, 12/11/2018); Colonoscopy with propofol (N/A, 04/29/2019); Esophagogastroduodenoscopy (egd) with propofol (N/A, 04/29/2019); Cystoscopy/ureteroscopy/holmium laser/stent placement (Left, 08/03/2020); and Cystoscopy/ureteroscopy/holmium laser/stent placement (Left, 08/28/2020). Family History:  His family history includes Heart attack in an other family member; Lung cancer in his father; Multiple sclerosis in his mother; Peripheral vascular disease in his maternal grandfather. Social History:   reports that he quit smoking about 20 years ago. His smoking use included cigars and cigarettes. He has a 70.00 pack-year smoking history. He has never used smokeless tobacco. He reports that he does not currently use alcohol. He reports that he does not use drugs.  Prior to Admission medications   Medication Sig Start Date End Date Taking? Authorizing Provider  ACCU-CHEK GUIDE test strip USE AS DIRECTED TO CHECK BLOOD SUGAR 3 TIMES DAILY Patient taking differently: in the morning and at bedtime. 07/14/19  Yes Philemon Kingdom, MD  aspirin EC 81 MG tablet Take 81 mg by mouth daily. Swallow whole.   Yes [provider]  carvedilol (COREG) 25 MG tablet TAKE 1 TABLET TWICE DAILY Patient taking differently: Take 25 mg by mouth in the morning and at bedtime. 07/11/20  Yes  [name], MD  DROPLET PEN NEEDLES 31G X 5 MM MISC USE TO INJECT INSULIN FOUR TIMES DAILY AS INSTRUCTED (FOR DIABETES) 05/02/20  Yes Philemon Kingdom, MD  insulin aspart (NOVOLOG FLEXPEN) 100 UNIT/ML FlexPen Inject 18-25 Units into the skin 3 (three) times daily with meals. Patient taking differently: Inject 18-25 Units into the skin 3 (three) times daily with meals. Per sliding scale 11/16/20  Yes Philemon Kingdom, MD  insulin glargine, 2 Unit Dial, (TOUJEO MAX SOLOSTAR) 300 UNIT/ML Solostar Pen Inject 56 Units into the skin at bedtime. Patient taking differently: Inject 55 Units into the skin at bedtime. 11/16/20  Yes Philemon Kingdom, MD  losartan-hydrochlorothiazide [facility]) 50-12.5 MG tablet TAKE 1 TABLET EVERY DAY 08/09/20  Yes [name], MD  metFORMIN (GLUCOPHAGE) 1000 MG tablet Take 2 tablets (2,000 mg total) by mouth daily with supper. 05/11/20  Yes Philemon Kingdom, MD  Multiple Vitamin (MULTIVITAMIN WITH MINERALS) TABS tablet Take 1 tablet by mouth daily. Centrum Silver for Men 50+   Yes [provider]  naproxen sodium (ALEVE) 220 MG tablet Take 440 mg by mouth daily as needed (joint pain).   Yes [provider]  ondansetron (ZOFRAN) 4 MG tablet Take 4 mg by mouth every 6 (six) hours as needed for nausea/vomiting. 07/17/20  Yes [provider]  tamsulosin (FLOMAX) 0.4 MG CAPS capsule Take 0.8 mg by mouth at bedtime.   Yes [provider]    Current Facility-Administered Medications  Medication Dose Route Frequency Provider Last Rate Last Admin   0.9 %  sodium chloride infusion  10 mL/hr Intravenous Once Norval Morton, MD   Paused at 01/11/2021 0700   acetaminophen (TYLENOL) tablet 650 mg  650 mg Oral Q6H PRN Norval Morton, MD       Or  acetaminophen (TYLENOL) suppository 650 mg  650 mg Rectal Q6H PRN Fuller Plan A, MD       albuterol (PROVENTIL) (2.5 MG/3ML) 0.083% nebulizer solution 2.5 mg  2.5 mg Nebulization Q6H PRN Tamala Julian,  Rondell A, MD       carvedilol (COREG) tablet 25 mg  25 mg Oral BID Fuller Plan A, MD   25 mg at 01/15/2021 0920   cefTRIAXone (ROCEPHIN) 2 g in sodium chloride 0.9 % 100 mL IVPB  2 g Intravenous Q24H Fuller Plan A, MD 200 mL/hr at 01/11/2021 0918 2 g at 01/17/2021 0918   furosemide (LASIX) injection 20 mg  20 mg Intravenous Once Fuller Plan A, MD       insulin aspart (novoLOG) injection 0-20 Units  0-20 Units Subcutaneous Q6H Fuller Plan A, MD   20 Units at 02/03/2021 0921   insulin glargine-yfgn (SEMGLEE) injection 30 Units  30 Units Subcutaneous QHS Smith, Rondell A, MD       metoprolol tartrate (LOPRESSOR) injection 5 mg  5 mg Intravenous Q6H PRN Fuller Plan A, MD       ondansetron (ZOFRAN) tablet 4 mg  4 mg Oral Q6H PRN Fuller Plan A, MD       Or   ondansetron (ZOFRAN) injection 4 mg  4 mg Intravenous Q6H PRN Norval Morton, MD       [START ON 01/29/2021] pantoprazole (PROTONIX) injection 40 mg  40 mg Intravenous Q12H Smith, Rondell A, MD       pantoprozole (PROTONIX) 80 mg /NS 100 mL infusion  8 mg/hr Intravenous Continuous Fuller Plan A, MD 10 mL/hr at 01/29/2021 0943 8 mg/hr at 01/15/2021 0943   sodium chloride flush (NS) 0.9 % injection 3 mL  3 mL Intravenous Q12H Smith, Rondell A, MD       tamsulosin (FLOMAX) capsule 0.8 mg  0.8 mg Oral QHS Smith, Rondell A, MD       Current Outpatient Medications  Medication Sig Dispense Refill   ACCU-CHEK GUIDE test strip USE AS DIRECTED TO CHECK BLOOD SUGAR 3 TIMES DAILY (Patient taking differently: in the morning and at bedtime.) 300 strip 2   aspirin EC 81 MG tablet Take 81 mg by mouth daily. Swallow whole.     carvedilol (COREG) 25 MG tablet TAKE 1 TABLET TWICE DAILY (Patient taking differently: Take 25 mg by mouth in the morning and at bedtime.) 180 tablet 3   DROPLET PEN NEEDLES 31G X 5 MM MISC USE TO INJECT INSULIN FOUR TIMES DAILY AS INSTRUCTED (FOR DIABETES) 400 each 11   insulin aspart (NOVOLOG FLEXPEN) 100 UNIT/ML FlexPen Inject  18-25 Units into the skin 3 (three) times daily with meals. (Patient taking differently: Inject 18-25 Units into the skin 3 (three) times daily with meals. Per sliding scale) 45 mL 3   insulin glargine, 2 Unit Dial, (TOUJEO MAX SOLOSTAR) 300 UNIT/ML Solostar Pen Inject 56 Units into the skin at bedtime. (Patient taking differently: Inject 55 Units into the skin at bedtime.) 18 mL 3   losartan-hydrochlorothiazide (HYZAAR) 50-12.5 MG tablet TAKE 1 TABLET EVERY DAY 90 tablet 3   metFORMIN (GLUCOPHAGE) 1000 MG tablet Take 2 tablets (2,000 mg total) by mouth daily with supper. 180 tablet 3   Multiple Vitamin (MULTIVITAMIN WITH MINERALS) TABS tablet Take 1 tablet by mouth daily. Centrum Silver for Men 50+     naproxen sodium (ALEVE) 220 MG tablet Take 440 mg by mouth daily as needed (joint pain).     ondansetron (ZOFRAN) 4 MG  tablet Take 4 mg by mouth every 6 (six) hours as needed for nausea/vomiting.     tamsulosin (FLOMAX) 0.4 MG CAPS capsule Take 0.8 mg by mouth at bedtime.      Allergies as of 02/02/2021 - Review Complete 01/10/2021  Allergen Reaction Noted   Atorvastatin Other (See Comments)    Ezetimibe Other (See Comments)    Simvastatin Other (See Comments)     Review of Systems:    Constitutional: No weight loss, fever, chills, weakness or fatigue HEENT: Eyes: No change in vision               Ears, Nose, Throat:  No change in hearing or congestion Skin: No rash or itching Cardiovascular: No chest pain, chest pressure or palpitations   Respiratory: No SOB or cough Gastrointestinal: See HPI and otherwise negative Genitourinary: No dysuria or change in urinary frequency Neurological: No headache, dizziness or syncope Musculoskeletal: No new muscle or joint pain Hematologic: No bleeding or bruising Psychiatric: No history of depression or anxiety     Physical Exam:  Vital signs in last 24 hours: Temp:  [98.4 F (36.9 C)-99.3 F (37.4 C)] 99.2 F (37.3 C) (01/20 0821) Pulse Rate:   [115-134] 132 (01/20 0821) Resp:  [15-32] 29 (01/20 0821) BP: (117-177)/(50-86) 151/51 (01/20 0821) SpO2:  [97 %-100 %] 97 % (01/20 0821) Weight:  [90.7 kg] 90.7 kg (01/19 2103)    General:   Pleasant, chronically ill-appearing obese male i Head:  Normocephalic and atraumatic. Eyes: sclerae anicteric,conjunctive pink, exophthalmos  Heart: Tachycardic, regular rhythm, no murmurs rubs or gallops. Pulm: Tachypneic clear anteriorly; no wheezing Abdomen:  Soft, Obese AB, skin exam normal, Normal bowel sounds.  no  tendernessWithout guarding and Without rebound, without hepatomegaly. Extremities:  bilateral BKA, no edema Neurologic: Appears to be oriented, and awake;  grossly normal neurologically. Skin:   Dry and intact without significant lesions or rashes. Psychiatric: Demonstrates good judgement and reason without abnormal affect or behaviors.  LAB RESULTS: Recent Labs    01/21/2021 2121 01/14/2021 0547  WBC 7.6  --   HGB 9.8* 9.5*  HCT 30.0* 27.6*  PLT 223  --    BMET Recent Labs    01/16/2021 2121  NA 131*  K 5.9*  CL 99  CO2 22  GLUCOSE 297*  BUN 64*  CREATININE 1.24  CALCIUM 8.5*   LFT Recent Labs    01/18/2021 2121  PROT 5.2*  ALBUMIN 2.7*  AST 26  ALT 15  ALKPHOS 48  BILITOT 1.4*   PT/INR No results for input(s): LABPROT, INR in the last 72 hours.  STUDIES: CT Abdomen Pelvis W Contrast  Result Date: 01/23/2021 CLINICAL DATA:  Rectal bleeding. EXAM: CT ABDOMEN AND PELVIS WITH CONTRAST TECHNIQUE: Multidetector CT imaging of the abdomen and pelvis was performed using the standard protocol following bolus administration of intravenous contrast. RADIATION DOSE REDUCTION: This exam was performed according to the departmental dose-optimization program which includes automated exposure control, adjustment of the mA and/or kV according to patient size and/or use of iterative reconstruction technique. CONTRAST:  136mL OMNIPAQUE IOHEXOL 300 MG/ML  SOLN COMPARISON:  November 02, 2020 FINDINGS: Lower chest: No acute abnormality. Hepatobiliary: No focal liver abnormality is seen. Numerous subcentimeter gallstones are seen within the lumen of an otherwise normal-appearing gallbladder. There is no evidence of biliary dilatation. Pancreas: Unremarkable. No pancreatic ductal dilatation or surrounding inflammatory changes. Spleen: A 1.9 cm x 1.1 cm area of predominant homogeneous low attenuation (approximately 49.88 Hounsfield units) is  seen within the anterior aspect of a markedly enlarged spleen. Adrenals/Urinary Tract: A 1.4 cm x 1.1 cm, nonspecific, fat density (approximately -22.71 Hounsfield units) left adrenal mass is seen. A 1.4 cm x 1.1 cm nonspecific low-attenuation right adrenal mass is also seen. Kidneys are normal in size. A 1.2 cm diameter simple renal cyst is seen within the lateral aspect of the mid left kidney. Bilateral subcentimeter nonobstructing renal calculi are seen within the mid right kidney and lower pole of the left kidney. The urinary bladder is mildly distended and otherwise unremarkable. Stomach/Bowel: Stomach is within normal limits. Appendix appears normal. No evidence of bowel wall thickening, distention, or inflammatory changes. Vascular/Lymphatic: Aortic atherosclerosis. No enlarged abdominal or pelvic lymph nodes. Reproductive: Numerous prostate radiation implantation seeds are seen. Other: No abdominal wall hernia or abnormality. No abdominopelvic ascites. Musculoskeletal: IMPRESSION: 1. Cholelithiasis without evidence of acute cholecystitis. 2. Markedly enlarged spleen with a 1.9 cm x 1.1 cm area of predominant homogeneous low attenuation. This may represent a splenic infarct. 3. 1.4 cm left adrenal mass, consistent with benign myelolipoma. No follow-up imaging is recommended. JACR 2017 Aug; 14(8):1038-44, JCAT 2016 Mar-Apr; 40(2):194-200, Urol J 2006 Spring; 3(2):71-4. 4. Additional nonspecific low-attenuation right adrenal mass which may represent an  adrenal adenoma. Correlation with follow-up adrenal protocol abdomen and pelvis CT is recommended. 5. Bilateral subcentimeter nonobstructing renal calculi. 6. Prostate radiation implantation seeds. 7. Aortic atherosclerosis Aortic Atherosclerosis (ICD10-I70.0). Electronically Signed   By: Virgina Norfolk M.D.   On: 01/30/2021 03:33     Vladimir Crofts  01/23/2021, 9:50 AM

## 2021-01-26 NOTE — ED Notes (Signed)
IV team at the bedside. 

## 2021-01-26 NOTE — ED Notes (Addendum)
This RN spoke with E-link doctor on the phone in effort to determine whether or not pt will be a medical examiner case and to request that the provider fill out a death certificate for pt. Aforementioned provider stated to this RN that she will call back with further information.

## 2021-01-26 NOTE — ED Notes (Signed)
Pt approved for fiance' to sign his blood consent.

## 2021-01-26 NOTE — ED Notes (Signed)
Hr elevated. EKG obtained. Patient cleaned up and brief changed. Condom cath put on patient

## 2021-01-26 NOTE — ED Provider Notes (Signed)
6:39 PM Nurse brought ECG to me, and I am very concerned about hyperkalemia ECG effects. Will give IV insulin, IV calcium, and albuterol neb in vent. I have paged his attending, Dr. Erin Fulling.   EKG Interpretation  Date/Time:  Friday January 26 2021 18:40:10 EST Ventricular Rate:  133 PR Interval:  115 QRS Duration: 132 QT Interval:  376 QTC Calculation: 560 R Axis:   43 Text Interpretation: Sinus or ectopic atrial tachycardia Nonspecific intraventricular conduction delay Abnormal inferior Q waves ST elevations/bizarre T waves likely hyperkalemia rather than STEMI Confirmed by Sherwood Gambler [phone]) on 01/18/2021 9:04:39 PM         Shortly after this, I was called to the bedside as he was getting worse.  Intermittently going in and out of what appeared to be a ventricular tachycardia with a wide QRS.  He was given IV calcium chloride multiple times.  We were setting up to do a femoral central line as he has 2 IVs but they are taken out by other access points.  Unfortunately, he decompensated despite the calcium and CPR had to be started.  Dr. Erin Fulling was called back and together we ran the code.  The patient was given multiple medicines, see code sheet for details but this included calcium, insulin, bicarbonate, epinephrine and amiodarone.  At 1 point he went into V. fib and was shocked.  He never regained spontaneous circulation.  Unfortunately he now appears to be in PEA with no pulses.  Dr. Erin Fulling had multiple conversations with family and we decided to call the code.  CRITICAL CARE Performed by: Ephraim Hamburger   Total critical care time: 30 minutes  Critical care time was exclusive of separately billable procedures and treating other patients.  Critical care was necessary to treat or prevent imminent or life-threatening deterioration.  Critical care was time spent personally by me on the following activities: development of treatment plan with patient and/or surrogate as well as  nursing, discussions with consultants, evaluation of patient's response to treatment, examination of patient, obtaining history from patient or surrogate, ordering and performing treatments and interventions, ordering and review of laboratory studies, ordering and review of radiographic studies, pulse oximetry and re-evaluation of patient's condition.   Cardiopulmonary Resuscitation (CPR) Procedure Note Directed/Performed by: Ephraim Hamburger I personally directed ancillary staff and/or performed CPR in an effort to regain return of spontaneous circulation and to maintain cardiac, neuro and systemic perfusion.     Sherwood Gambler, MD 01/21/2021 2107

## 2021-01-26 NOTE — ED Notes (Signed)
Pt noted to have significant drop in BP. Pt brady'd down to the teens and ultimately asystole. CPR was initiated immediately, code blue was called. MD Regenia Skeeter at bedside during witnessed arrest

## 2021-01-26 NOTE — ED Notes (Signed)
RN spoke with ED provider in person; 2 E-link providers on the phone; and paged the night shift critical care provider twice (without response). Communication was to figure out whether or not pt was medical examiner case and requested that any of these providers fill out a death certificate for pt. Per ED provider, pt is not medical examiner case.

## 2021-01-26 NOTE — ED Notes (Signed)
Pt stated he would like his CPAP machine to rest. Notified RT

## 2021-01-26 NOTE — Code Documentation (Addendum)
Time of death pronounced by Dr. Regenia Skeeter

## 2021-01-26 NOTE — Code Documentation (Signed)
No pulse.  CPR resumed.   

## 2021-01-26 NOTE — Telephone Encounter (Signed)
[facility] Night - Client TELEPHONE ADVICE RECORD [facility] Patient Name: [name] Gender: Male DOB: [birth year] Age: 71 Y 42 M 9 D Return Phone Number: [phone] (Primary) Address: [name]  [address] Client [facility] Night - Client Client Site [place] Provider Viviana Simpler- MD Contact Type Call Who Is Calling Patient / Member / Family / Caregiver Call Type Triage / Clinical Caller Name Trina Ao Relationship To Patient Other Return Phone Number [phone] (Primary) Chief Complaint Rectal Bleeding Reason for Call Symptomatic / Request for Health Information Initial Comment Caller states finance' has rectal bleeding. He has been at the ER since 6pm. They have done all the test but the hospital has no beds. Translation No No Triage Reason Other Nurse Assessment Nurse: Luberta Mutter, RN, Beola Cord Date/Time Eilene Ghazi Time): 01/22/2021 4:50:29 AM Confirm and document reason for call. If symptomatic, describe symptoms. ---Caller states finance has rectal bleeding. He has been at the ER since 6pm but hospital states they do not have any beds available. Caller states they have completed blood work and imaging but has not seen a doctor yet. Does the patient have any new or worsening symptoms? ---Yes Will a triage be completed? ---No Select reason for no triage. ---Other Please document clinical information provided and list any resource used. ---Patient is currently in the emergency department awaiting placement. Informed caller that unfortunately given the circumstances and patient's hx it is best for patient to wait and be treated. Disp. Time Eilene Ghazi Time) Disposition Final User 01/29/2021 4:52:51 AM Clinical Call Yes Luberta Mutter, RN, Jannette Spanner

## 2021-01-26 NOTE — ED Notes (Signed)
Pt continuing to go in and out of narrow complex tachycardias w/ evolving ST changes. Multiple repeat EKGs obtained. MD Regenia Skeeter given repeat EKGs and advised of changes in pt condition

## 2021-01-26 NOTE — Consult Note (Signed)
NAME:  [name], MRN:  [id number], DOB:  Aug 29, 1950, LOS: 0 ADMISSION DATE:  01/11/2021, CONSULTATION DATE:  01/14/2021 REFERRING MD:  Dr. Tamala Julian, CHIEF COMPLAINT:  GIB with ARF   History of Present Illness:  [name] is a ,age male with an extensive PMH (see below) that presented with complaints of black tarry stool and ABD that began day prior to admission. Patient has a HX of prior GIB felt secondary to diverticular bleed April of 2021. EGD was negative, colonoscopy noted pancolonic diverticulosis without bleed.   On PCCM consult in ED patient was tachycardic, tachypneic, hypertensive and severely encephalopathic. Lab work significant for NA 126, K 6.7, glucose 477, elevated creatinine, lactic 3.0, hgb 9.8, GI consulted in ED. given increased work of breathing with likely impending respiratory failure decision was made to empirically intubate in ED  Pertinent  Medical History   has a past medical history of Basal cell carcinoma (02/23/2019), Cellulitis and abscess of left lower extremity (12/20/2017), Coronary atherosclerosis of unspecified type of vessel, native or graft, Diabetes mellitus without complication ([place]), Hemorrhage of rectum and anus, History of kidney stones, Hypertension, Impotence of organic origin, Internal hemorrhoids without mention of complication, Malignant neoplasm of prostate ([place]), Mononeuritis of unspecified site, Osteoarthrosis, unspecified whether generalized or localized, unspecified site, Other and unspecified hyperlipidemia, Peripheral vascular disease ([place]), Personal history of colonic polyps, Personal history of gallstones, Subacute osteomyelitis, right ankle and foot ([place]), Type II or unspecified type diabetes mellitus with neurological manifestations, not stated as uncontrolled(250.60), Unspecified glaucoma(365.9), and Unspecified sleep apnea.  Significant Hospital Events: Including procedures, antibiotic start and stop dates in addition to other pertinent  events   1/20 Admitted for GIB with ABD pain.  Progressive decline while in emergency department resulting in PCCM consult and intubation in ED  Interim History / Subjective:  As above   Objective   Blood pressure (!) 180/71, pulse (!) 135, temperature 98.4 F (36.9 C), temperature source Oral, resp. rate (!) 34, height 6\' 2"  (1.88 m), weight 90.7 kg, SpO2 98 %.        Intake/Output Summary (Last 24 hours) at 01/18/2021 1708 Last data filed at 01/19/2021 1332 Gross per 24 hour  Intake 479 ml  Output 900 ml  Net -421 ml   Filed Weights   01/16/2021 2103  Weight: 90.7 kg    Examination: General: Acute on chronic ill-appearing elderly male lying in bed encephalopathic and in mild distress HEENT: [place]/AT, MM pink/moist, PERRL,  Neuro: Eyes spontaneously open with spontaneous upper extremity movement seen unable to follow commands CV: Tachycardia s1s2 regular rate and rhythm, no murmur, rubs, or gallops,  PULM: Significant tachypnea with increased work of breathing, diminished breath sounds bilaterally GI: soft, bowel sounds active in all 4 quadrants, non-tender, non-distended,  Extremities: warm/dry, no edema  Skin: no rashes or lesions  Resolved Hospital Problem list     Assessment & Plan:  Acute blood loss anemia  Hematochezia with HX of diverticular bled  -Hx of diverticular bleed April of 2021, both upper and lower endoscopy negative for acute bleed at that time  Cholelithiasis  Enlarged spleen  -Seen on admit CT  P: GI consulted, appreciate assistance  Transfuse per protocol Hgb goal > 7 Trend CBC  Identify and treat course  NPO  Maintain 2 large bore PIVs Continue IV Protonix BID  Hold any further anticoagulation or NSAIDs    Severe sepsis  -Initially presented with stable vitals but on PCCM evaluation patient meds criteria for  severe sepsis given HR 131, RR 34, with AMS and lactic acidosis and concern for underlying infection  -Possible urine source   P: Supplemental oxygen Pan cultures prior to antibiotic Empiric IV antibiotics  Aggressive IV hydration Trend lactic acid Procalcitonin Monitor urine output  Concern for inability to protect airway given acute encephalopathy -On ED evaluation by critical care patient was seen severely encephalopathic with tachypnea and mild increased work of breathing and decision was made to empirically intubate for airway protection P: Continue ventilator support with lung protective strategies  Wean PEEP and FiO2 for sats greater than 90%. Head of bed elevated 30 degrees. Plateau pressures less than 30 cm H20.  Follow intermittent chest x-ray and ABG.   SAT/SBT as tolerated, mentation preclude extubation  Ensure adequate pulmonary hygiene  VAP bundle in place  PAD protocol  Acute Kidney Injury  -Creatinine on admit 1.51, creatinine 0.86 10/2020 Hyperkalemia  Bilateral Adrenal masses  P: Temporizing measures  Follow renal function  Monitor urine output Trend Bmet Avoid nephrotoxins Ensure adequate renal perfusion   Acute encephalopathy  -Likely metabolic in nature given severe metabolic derangements P: Maintain neuro protective measures; goal for eurothermia, euglycemia, eunatermia, normoxia, and PCO2 goal of 35-40 Nutrition and bowel regiment  Seizure precautions  Aspirations precautions  Treat underlying cause  Hyperglycemia vs HHS  Hx of type 2 diabetes  P: Insulin drip  Closely monitor patient's electrolytes  Maintain potassium greater than 4 Accu-Cheks every 1 hour Monitor renal function Check hemoglobin A1c  NPO Pad protocol post intubation Follow blood pressure post  Hx of CAD s/p CABG  HTN  P: Continuous telemetry  Obtain BNP Strict intake and output  Obtain ECHO  Daily weight to assess volume status Daily assessment for need to diurese with the use of IV lasix   Closely monitor renal function and electrolytes  Ensure hemodynamic control  Hyponatremia   -Na 126 P: IV hydration  Urine studies   Best Practice (right click and "Reselect all SmartList Selections" daily)   Diet/type: NPO DVT prophylaxis: SCD GI prophylaxis: PPI Lines: N/A Foley:  N/A Code Status:  full code Last date of multidisciplinary goals of care discussion: Pending   Labs   CBC: Recent Labs  Lab 02/03/2021 2121 02/05/2021 0547  WBC 7.6  --   NEUTROABS 6.1  --   HGB 9.8* 9.5*  HCT 30.0* 27.6*  MCV 79.4*  --   PLT 223  --     Basic Metabolic Panel: Recent Labs  Lab 02/02/2021 2121 01/21/2021 1055 01/12/2021 1521  NA 131* 128* 126*  K 5.9* 6.2* 6.7*  CL 99 100 99  CO2 22 15* 18*  GLUCOSE 297* 411* 477*  BUN 64* 88* 98*  CREATININE 1.24 1.30* 1.51*  CALCIUM 8.5* 8.1* 8.0*   GFR: Estimated Creatinine Clearance: 51.4 mL/min (A) (by C-G formula based on SCr of 1.51 mg/dL (H)). Recent Labs  Lab 01/19/2021 2121 02/03/2021 0634 01/20/2021 1520  WBC 7.6  --   --   LATICACIDVEN  --  2.8* 3.0*    Liver Function Tests: Recent Labs  Lab 01/28/2021 2121  AST 26  ALT 15  ALKPHOS 48  BILITOT 1.4*  PROT 5.2*  ALBUMIN 2.7*   Recent Labs  Lab 01/10/2021 2121  LIPASE 50   No results for input(s): AMMONIA in the last 168 hours.  ABG    Component Value Date/Time   TCO2 26 11/01/2016 1802     Coagulation Profile: No results for input(s): INR, PROTIME  in the last 168 hours.  Cardiac Enzymes: No results for input(s): CKTOTAL, CKMB, CKMBINDEX, TROPONINI in the last 168 hours.  HbA1C: Hemoglobin A1C  Date/Time Value Ref Range Status  11/16/2020 03:07 PM 5.8 (A) 4.0 - 5.6 % Final   Hgb A1c MFr Bld  Date/Time Value Ref Range Status  07/28/2020 03:16 PM 6.1 (H) 4.8 - 5.6 % Final    Comment:    (NOTE)         Prediabetes: 5.7 - 6.4         Diabetes: >6.4         Glycemic control for adults with diabetes: <7.0   04/10/2020 02:59 PM 6.8 (H) 4.6 - 6.5 % Final    Comment:    Glycemic Control Guidelines for People with Diabetes:Non Diabetic:  <6%Goal of  Therapy: <7%Additional Action Suggested:  >8%     CBG: Recent Labs  Lab 01/31/2021 0859 02/06/2021 1039 01/31/2021 1510  GLUCAP 425* 430* 372*    Review of Systems:   Unable to assess   Past Medical History:  He,  has a past medical history of Basal cell carcinoma (02/23/2019), Cellulitis and abscess of left lower extremity (12/20/2017), Coronary atherosclerosis of unspecified type of vessel, native or graft, Diabetes mellitus without complication ([place]), Hemorrhage of rectum and anus, History of kidney stones, Hypertension, Impotence of organic origin, Internal hemorrhoids without mention of complication, Malignant neoplasm of prostate ([place]), Mononeuritis of unspecified site, Osteoarthrosis, unspecified whether generalized or localized, unspecified site, Other and unspecified hyperlipidemia, Peripheral vascular disease ([place]), Personal history of colonic polyps, Personal history of gallstones, Subacute osteomyelitis, right ankle and foot ([place]), Type II or unspecified type diabetes mellitus with neurological manifestations, not stated as uncontrolled(250.60), Unspecified glaucoma(365.9), and Unspecified sleep apnea.   Surgical History:   Past Surgical History:  Procedure Laterality Date   AMPUTATION Left 12/30/2012   Procedure: AMPUTATION RAY ;  Surgeon: Meredith Pel, MD;  Location: [facility];  Service: Orthopedics;  Laterality: Left;  LEFT GREAT TOE RAY AMPUTATION   AMPUTATION Left 02/23/2016   Procedure: Left Below Knee Amputation;  Surgeon: [name], MD;  Location: [facility];  Service: Orthopedics;  Laterality: Left;   AMPUTATION Left 12/24/2017   Procedure: LEFT ABOVE KNEE AMPUTATION;  Surgeon: [name], MD;  Location: [facility];  Service: Orthopedics;  Laterality: Left;   AMPUTATION Right 12/24/2017   Procedure: RIGHT 5TH RAY AMPUTATION;  Surgeon: [name], MD;  Location: [place];  Service: Orthopedics;  Laterality: Right;   AMPUTATION Right 12/11/2018   Procedure: RIGHT ABOVE  KNEE AMPUTATION;  Surgeon: [name], MD;  Location: [facility];  Service: Orthopedics;  Laterality: Right;   APPENDECTOMY     BASAL CELL CARCINOMA EXCISION  2/16   left forearm   [place]   Negative   CATARACT EXTRACTION Right 2017   then lid surgery   COLONOSCOPY     COLONOSCOPY WITH PROPOFOL N/A 04/29/2019   Procedure: COLONOSCOPY WITH PROPOFOL;  Surgeon: Jackquline Denmark, MD;  Location[facility];  Service: Endoscopy;  Laterality: N/A;   CORONARY ARTERY BYPASS GRAFT  09/2005   5 bypasses per patient, Post op AFIB   CYSTOSCOPY/URETEROSCOPY/HOLMIUM LASER/STENT PLACEMENT Left 08/03/2020   Procedure: CYSTOSCOPY/ RETROGRADE/STENT PLACEMENT;  Surgeon: Raynelle Bring, MD;  Location: [facility];  Service: Urology;  Laterality: Left;   CYSTOSCOPY/URETEROSCOPY/HOLMIUM LASER/STENT PLACEMENT Left 08/28/2020   Procedure: CYSTOSCOPY/RETROGRADE/URETEROSCOPY/HOLMIUM LASER/STENT PLACEMENT;  Surgeon: Raynelle Bring, MD;  Location:  [facility];  Service: Urology;  Laterality: Left;   ESOPHAGOGASTRODUODENOSCOPY (EGD) WITH PROPOFOL N/A 04/29/2019   Procedure: ESOPHAGOGASTRODUODENOSCOPY (EGD) WITH PROPOFOL;  Surgeon: Jackquline Denmark, MD;  Location: [facility];  Service: Endoscopy;  Laterality: N/A;   EYE SURGERY     FOOT BONE EXCISION Left 11/03/2013   DR DUDA    I & D EXTREMITY Left 11/03/2013   Procedure: Left Foot Partial Bone Excision Cuboid and Medial Cuneiform, Wound Closures;  Surgeon: [name], MD;  Location: [place];  Service: Orthopedics;  Laterality: Left;   INSERTION PROSTATE RADIATION SEED  2009   RT and seeds for prostate cancer   KIDNEY STONE SURGERY  04/1993   RCA stents  04/2003   EF 55%   RETINAL DETACHMENT SURGERY  2002-2003   thrombosed vein  1993   Right leg     Social History:   reports that he quit smoking about 20 years ago. His smoking use included cigars and cigarettes. He has a 70.00 pack-year smoking history. He has never used smokeless  tobacco. He reports that he does not currently use alcohol. He reports that he does not use drugs.   Family History:  His family history includes Heart attack in an other family member; Lung cancer in his father; Multiple sclerosis in his mother; Peripheral vascular disease in his maternal grandfather.   Allergies Allergies  Allergen Reactions   Atorvastatin Other (See Comments)    myalgias   Ezetimibe Other (See Comments)    Body ache   Simvastatin Other (See Comments)    Body ache     Home Medications  Prior to Admission medications   Medication Sig Start Date End Date Taking? Authorizing Provider  ACCU-CHEK GUIDE test strip USE AS DIRECTED TO CHECK BLOOD SUGAR 3 TIMES DAILY Patient taking differently: in the morning and at bedtime. 07/14/19  Yes Philemon Kingdom, MD  aspirin EC 81 MG tablet Take 81 mg by mouth daily. Swallow whole.   Yes [provider]  carvedilol (COREG) 25 MG tablet TAKE 1 TABLET TWICE DAILY Patient taking differently: Take 25 mg by mouth in the morning and at bedtime. 07/11/20  Yes [name], MD  DROPLET PEN NEEDLES 31G X 5 MM MISC USE TO INJECT INSULIN FOUR TIMES DAILY AS INSTRUCTED (FOR DIABETES) 05/02/20  Yes Philemon Kingdom, MD  insulin aspart (NOVOLOG FLEXPEN) 100 UNIT/ML FlexPen Inject 18-25 Units into the skin 3 (three) times daily with meals. Patient taking differently: Inject 18-25 Units into the skin 3 (three) times daily with meals. Per sliding scale 11/16/20  Yes Philemon Kingdom, MD  insulin glargine, 2 Unit Dial, (TOUJEO MAX SOLOSTAR) 300 UNIT/ML Solostar Pen Inject 56 Units into the skin at bedtime. Patient taking differently: Inject 55 Units into the skin at bedtime. 11/16/20  Yes Philemon Kingdom, MD  losartan-hydrochlorothiazide [facility]) 50-12.5 MG tablet TAKE 1 TABLET EVERY DAY 08/09/20  Yes [name], MD  metFORMIN (GLUCOPHAGE) 1000 MG tablet Take 2 tablets (2,000 mg total) by mouth daily with supper. 05/11/20  Yes  Philemon Kingdom, MD  Multiple Vitamin (MULTIVITAMIN WITH MINERALS) TABS tablet Take 1 tablet by mouth daily. Centrum Silver for Men 50+   Yes [provider]  naproxen sodium (ALEVE) 220 MG tablet Take 440 mg by mouth daily as needed (joint pain).   Yes [provider]  ondansetron (ZOFRAN) 4 MG tablet Take 4 mg by mouth every 6 (six) hours as needed for nausea/vomiting. 07/17/20  Yes [provider]  tamsulosin (  FLOMAX) 0.4 MG CAPS capsule Take 0.8 mg by mouth at bedtime.   Yes [provider]     Critical care time:    CRITICAL CARE Performed by: Kayshawn Ozburn D. Harris  Total critical care time: 55 minutes  Critical care time was exclusive of separately billable procedures and treating other patients.  Critical care was necessary to treat or prevent imminent or life-threatening deterioration.  Critical care was time spent personally by me on the following activities: development of treatment plan with patient and/or surrogate as well as nursing, discussions with consultants, evaluation of patient's response to treatment, examination of patient, obtaining history from patient or surrogate, ordering and performing treatments and interventions, ordering and review of laboratory studies, ordering and review of radiographic studies, pulse oximetry and re-evaluation of patient's condition.  Leeyah Heather D. Kenton Kingfisher, NP-C [facility] Personal contact information can be found on Amion  01/31/2021, 6:22 PM

## 2021-01-26 NOTE — Code Documentation (Signed)
Dark/ black diarrhea excessive stool throughout CPR.

## 2021-01-27 LAB — UREA NITROGEN, URINE: Urea Nitrogen, Ur: 772 mg/dL

## 2021-01-28 LAB — URINE CULTURE: Culture: 50000 — AB

## 2021-01-29 LAB — BPAM RBC
Blood Product Expiration Date: 202302062359
Blood Product Expiration Date: 202302062359
Blood Product Expiration Date: 202302082359
Blood Product Expiration Date: 202302082359
ISSUE DATE / TIME: 202301200752
ISSUE DATE / TIME: 202301201045
Unit Type and Rh: 6200
Unit Type and Rh: 6200
Unit Type and Rh: 7300
Unit Type and Rh: 7300

## 2021-01-29 LAB — TYPE AND SCREEN
ABO/RH(D): AB POS
Antibody Screen: NEGATIVE
Unit division: 0
Unit division: 0
Unit division: 0
Unit division: 0

## 2021-01-29 LAB — HEMOGLOBIN A1C
Hgb A1c MFr Bld: 6.8 % — ABNORMAL HIGH (ref 4.8–5.6)
Mean Plasma Glucose: 148 mg/dL

## 2021-01-31 LAB — CULTURE, BLOOD (ROUTINE X 2)
Culture: NO GROWTH
Culture: NO GROWTH
Special Requests: ADEQUATE
Special Requests: ADEQUATE

## 2021-02-07 NOTE — Death Summary Note (Signed)
DEATH SUMMARY   Patient Details  Name: [name] MRN: [id number] DOB: [birth year]  Admission/Discharge Information   Admit Date:  January 30, 2021  Date of Death: Date of Death: 2021/01/31  Time of Death: Time of Death: 1924/03/27  Length of Stay: 1  Referring Physician: [name], MD   Reason(s) for Hospitalization  GI Bleeding  Diagnoses  Preliminary cause of death:  GI Bleeding  Secondary Diagnoses (including complications and co-morbidities):  Principal Problem:   GI bleed Active Problems:   Coronary atherosclerosis of native coronary artery   Obstructive sleep apnea   Type 2 diabetes mellitus with diabetic polyneuropathy, with long-term current use of insulin (HCC)   Acute blood loss anemia   Essential hypertension   PVD (peripheral vascular disease) (HCC)   Hyperkalemia   History of prostate cancer   Spleen enlarged   Acute lower UTI   SIRS (systemic inflammatory response syndrome) (HCC)   ABLA (acute blood loss anemia)   Brief Hospital Course (including significant findings, care, treatment, and services provided and events leading to death)  FARRELL PANTALEO is a 71 y.o. year old male with coronary artery disease, diabetes type II, hypertension, peripheral vascular disease s/p bilateral BKA, diverticulosis and history of gastrointestinal bleeding who presented to [facility] on 2021-01-30 with abdominal pain and bloody bowel movements. He was admitted to the hospitalist service and GI evaluated the patient in consultation. He was monitored closely for concern of diverticular bleeding. His hemoglobin and hematocrit was 9.8g/dL on admission and remained between 9.2-9.5g/dL on follow up CBC despite being transfused 2 units PRBCs. His previous baseline Hemoglobin was 14g/dL 10/2020. His potassium was 5.9 on arrival in the ER and increased to 6.7 the afternoon of 01-31-2021. Patient developed increasing shortness of breath, tachycardia, and altered mentation the afternoon of  01/31/2021 and PCCM was consulted. Given patient's work of breathing it was recommended that he be intubated which his daughter provided consent for the procedure but stated he would not want prolonged intubation. Patient was successfully intubated in the ER. He then developed abnormal EKGs concerning for peaked t-waves related to his hyperkalemia. The ER physician and critical care team decided to treat the hyperkalemia aggressively. The patient then developed hypotension and went asystole. Code blue was called and CPR started immediately. Patient was then in PEA arrest on pulse checks. Multiple rounds of CPR were performed and discussions were had with the family. Resuscitative care was continued at the request of the family. After about 30 minutes of resuscitative care, time of death was called with ER and PCCM teams present. Time of death was 1924/03/27 on 01/31/2021.     Pertinent Labs and Studies  Significant Diagnostic Studies CT Abdomen Pelvis W Contrast  Result Date: 2021-01-31 CLINICAL DATA:  Rectal bleeding. EXAM: CT ABDOMEN AND PELVIS WITH CONTRAST TECHNIQUE: Multidetector CT imaging of the abdomen and pelvis was performed using the standard protocol following bolus administration of intravenous contrast. RADIATION DOSE REDUCTION: This exam was performed according to the departmental dose-optimization program which includes automated exposure control, adjustment of the mA and/or kV according to patient size and/or use of iterative reconstruction technique. CONTRAST:  149mL OMNIPAQUE IOHEXOL 300 MG/ML  SOLN COMPARISON:  November 02, 2020 FINDINGS: Lower chest: No acute abnormality. Hepatobiliary: No focal liver abnormality is seen. Numerous subcentimeter gallstones are seen within the lumen of an otherwise normal-appearing gallbladder. There is no evidence of biliary dilatation. Pancreas: Unremarkable. No pancreatic ductal dilatation or surrounding inflammatory changes. Spleen: A 1.9 cm  x 1.1 cm area of  predominant homogeneous low attenuation (approximately 49.88 Hounsfield units) is seen within the anterior aspect of a markedly enlarged spleen. Adrenals/Urinary Tract: A 1.4 cm x 1.1 cm, nonspecific, fat density (approximately -22.71 Hounsfield units) left adrenal mass is seen. A 1.4 cm x 1.1 cm nonspecific low-attenuation right adrenal mass is also seen. Kidneys are normal in size. A 1.2 cm diameter simple renal cyst is seen within the lateral aspect of the mid left kidney. Bilateral subcentimeter nonobstructing renal calculi are seen within the mid right kidney and lower pole of the left kidney. The urinary bladder is mildly distended and otherwise unremarkable. Stomach/Bowel: Stomach is within normal limits. Appendix appears normal. No evidence of bowel wall thickening, distention, or inflammatory changes. Vascular/Lymphatic: Aortic atherosclerosis. No enlarged abdominal or pelvic lymph nodes. Reproductive: Numerous prostate radiation implantation seeds are seen. Other: No abdominal wall hernia or abnormality. No abdominopelvic ascites. Musculoskeletal: IMPRESSION: 1. Cholelithiasis without evidence of acute cholecystitis. 2. Markedly enlarged spleen with a 1.9 cm x 1.1 cm area of predominant homogeneous low attenuation. This may represent a splenic infarct. 3. 1.4 cm left adrenal mass, consistent with benign myelolipoma. No follow-up imaging is recommended. JACR 2017 Aug; 14(8):1038-44, JCAT 2016 Mar-Apr; 40(2):194-200, Urol J 2006 Spring; 3(2):71-4. 4. Additional nonspecific low-attenuation right adrenal mass which may represent an adrenal adenoma. Correlation with follow-up adrenal protocol abdomen and pelvis CT is recommended. 5. Bilateral subcentimeter nonobstructing renal calculi. 6. Prostate radiation implantation seeds. 7. Aortic atherosclerosis Aortic Atherosclerosis (ICD10-I70.0). Electronically Signed   By: Virgina Norfolk M.D.   On: 01/29/2021 03:33   DG Chest Portable 1 View  Result Date:  01/16/2021 CLINICAL DATA:  Tube placement EXAM: PORTABLE CHEST 1 VIEW COMPARISON:  01/08/2021 FINDINGS: Endotracheal tube is 3.3 cm above the carina. NG tube enters the stomach. Heart is normal size. Prior CABG. Bibasilar atelectasis or infiltrates. No effusions or acute bony abnormality. IMPRESSION: Support devices in expected position. Bibasilar atelectasis or infiltrates. Electronically Signed   By: Rolm Baptise M.D.   On: 01/22/2021 18:26   DG CHEST PORT 1 VIEW  Result Date: 01/11/2021 CLINICAL DATA:  Shortness of breath and weakness. EXAM: PORTABLE CHEST 1 VIEW COMPARISON:  09/16/2007 FINDINGS: Previous median sternotomy and CABG procedure. Lung volumes are low. No pleural effusion or edema. No airspace opacities identified. Osseous structures appear intact. IMPRESSION: Low lung volumes. Electronically Signed   By: Kerby Moors M.D.   On: 01/22/2021 09:57    Microbiology Recent Results (from the past 240 hour(s))  Resp Panel by RT-PCR (Flu A&B, Covid) Nasopharyngeal Swab     Status: None   Collection Time: 02/06/2021  6:34 AM   Specimen: Nasopharyngeal Swab; Nasopharyngeal(NP) swabs in vial transport medium  Result Value Ref Range Status   SARS Coronavirus 2 by RT PCR NEGATIVE NEGATIVE Final    Comment: (NOTE) SARS-CoV-2 target nucleic acids are NOT DETECTED.  The SARS-CoV-2 RNA is generally detectable in upper respiratory specimens during the acute phase of infection. The lowest concentration of SARS-CoV-2 viral copies this assay can detect is 138 copies/mL. A negative result does not preclude SARS-Cov-2 infection and should not be used as the sole basis for treatment or other patient management decisions. A negative result may occur with  improper specimen collection/handling, submission of specimen other than nasopharyngeal swab, presence of viral mutation(s) within the areas targeted by this assay, and inadequate number of viral copies(<138 copies/mL). A negative result must be  combined with clinical observations, patient history, and epidemiological information.  The expected result is Negative.  Fact Sheet for Patients:  EntrepreneurPulse.com.au  Fact Sheet for Healthcare Providers:  IncredibleEmployment.be  This test is no t yet approved or cleared by the [place] FDA and  has been authorized for detection and/or diagnosis of SARS-CoV-2 by FDA under an Emergency Use Authorization (EUA). This EUA will remain  in effect (meaning this test can be used) for the duration of the COVID-19 declaration under Section 564(b)(1) of the Act, 21 U.S.C.section 360bbb-3(b)(1), unless the authorization is terminated  or revoked sooner.       Influenza A by PCR NEGATIVE NEGATIVE Final   Influenza B by PCR NEGATIVE NEGATIVE Final    Comment: (NOTE) The Xpert Xpress SARS-CoV-2/FLU/RSV plus assay is intended as an aid in the diagnosis of influenza from Nasopharyngeal swab specimens and should not be used as a sole basis for treatment. Nasal washings and aspirates are unacceptable for Xpert Xpress SARS-CoV-2/FLU/RSV testing.  Fact Sheet for Patients: EntrepreneurPulse.com.au  Fact Sheet for Healthcare Providers: IncredibleEmployment.be  This test is not yet approved or cleared by the [place] FDA and has been authorized for detection and/or diagnosis of SARS-CoV-2 by FDA under an Emergency Use Authorization (EUA). This EUA will remain in effect (meaning this test can be used) for the duration of the COVID-19 declaration under Section 564(b)(1) of the Act, 21 U.S.C. section 360bbb-3(b)(1), unless the authorization is terminated or revoked.  Performed at [facility], [place] [address] [place], [place] [address]   Urine Culture     Status: Abnormal (Preliminary result)   Collection Time: 01/19/2021  8:30 AM   Specimen: Urine, Clean Catch  Result Value Ref Range Status   Specimen  Description URINE, CLEAN CATCH  Final   Special Requests NONE  Final   Culture (A)  Final    50,000 COLONIES/mL ENTEROCOCCUS FAECALIS SUSCEPTIBILITIES TO FOLLOW Performed at [facility], [place] [address] [place], [place] [address]    Report Status PENDING  Incomplete  Culture, blood (routine x 2)     Status: None (Preliminary result)   Collection Time: 01/20/2021  3:10 PM   Specimen: Right Antecubital; Blood  Result Value Ref Range Status   Specimen Description RIGHT ANTECUBITAL  Final   Special Requests   Final    BOTTLES DRAWN AEROBIC AND ANAEROBIC Blood Culture adequate volume   Culture   Final    NO GROWTH < 24 HOURS Performed at [facility], [place] [address] [place], [place] [address]    Report Status PENDING  Incomplete  Culture, blood (routine x 2)     Status: None (Preliminary result)   Collection Time: 01/31/2021  3:30 PM   Specimen: BLOOD RIGHT HAND  Result Value Ref Range Status   Specimen Description BLOOD RIGHT HAND  Final   Special Requests   Final    BOTTLES DRAWN AEROBIC AND ANAEROBIC Blood Culture adequate volume   Culture   Final    NO GROWTH < 24 HOURS Performed at [facility], [place] [address] [place], [place] [address]    Report Status PENDING  Incomplete    Lab Basic Metabolic Panel: Recent Labs  Lab 01/24/2021 2121 02/03/2021 1055 01/20/2021 1521 02/02/2021 1729  NA 131* 128* 126* 126*  K 5.9* 6.2* 6.7* 7.0*  CL 99 100 99  --   CO2 22 15* 18*  --   GLUCOSE 297* 411* 477*  --   BUN 64* 88* 98*  --   CREATININE  1.24 1.30* 1.51*  --   CALCIUM 8.5* 8.1* 8.0*  --    Liver Function Tests: Recent Labs  Lab 02/03/2021 2121  AST 26  ALT 15  ALKPHOS 48  BILITOT 1.4*  PROT 5.2*  ALBUMIN 2.7*   Recent Labs  Lab 01/31/2021 2121  LIPASE 50   No results for input(s): AMMONIA in the last 168 hours. CBC: Recent Labs  Lab 02/01/2021 2121 01/23/2021 0547 01/08/2021 1700 01/16/2021 1729  WBC 7.6  --   --   --   NEUTROABS 6.1  --   --   --    HGB 9.8* 9.5* 9.5* 9.2*  HCT 30.0* 27.6* 28.9* 27.0*  MCV 79.4*  --   --   --   PLT 223  --   --   --    Cardiac Enzymes: No results for input(s): CKTOTAL, CKMB, CKMBINDEX, TROPONINI in the last 168 hours. Sepsis Labs: Recent Labs  Lab 01/14/2021 2121 01/30/2021 0634 02/01/2021 1520 01/29/2021 1921  WBC 7.6  --   --   --   LATICACIDVEN  --  2.8* 3.0* 6.1*    Procedures/Operations  Endotracheal Intubation CPR   Freddi Starr 02-16-21, 2:49 PM

## 2021-02-07 DEATH — deceased

## 2021-04-12 ENCOUNTER — Encounter: Payer: Medicare Other | Admitting: Internal Medicine

## 2021-05-10 ENCOUNTER — Ambulatory Visit: Payer: Medicare Other | Admitting: Internal Medicine
